# Patient Record
Sex: Male | Born: 1950
Health system: Southern US, Community
[De-identification: ages and names within clinical notes are randomized; demographics above are authoritative.]

## PROBLEM LIST (undated history)

## (undated) DIAGNOSIS — D649 Anemia, unspecified: Secondary | ICD-10-CM

## (undated) DIAGNOSIS — G4730 Sleep apnea, unspecified: Secondary | ICD-10-CM

## (undated) DIAGNOSIS — Z9889 Other specified postprocedural states: Secondary | ICD-10-CM

## (undated) DIAGNOSIS — I255 Ischemic cardiomyopathy: Secondary | ICD-10-CM

## (undated) DIAGNOSIS — I2510 Atherosclerotic heart disease of native coronary artery without angina pectoris: Secondary | ICD-10-CM

## (undated) DIAGNOSIS — Z8042 Family history of malignant neoplasm of prostate: Secondary | ICD-10-CM

## (undated) DIAGNOSIS — I301 Infective pericarditis: Secondary | ICD-10-CM

## (undated) DIAGNOSIS — T8859XA Other complications of anesthesia, initial encounter: Secondary | ICD-10-CM

## (undated) DIAGNOSIS — I213 ST elevation (STEMI) myocardial infarction of unspecified site: Secondary | ICD-10-CM

## (undated) DIAGNOSIS — Z8043 Family history of malignant neoplasm of testis: Secondary | ICD-10-CM

## (undated) DIAGNOSIS — I639 Cerebral infarction, unspecified: Secondary | ICD-10-CM

## (undated) DIAGNOSIS — F32A Depression, unspecified: Secondary | ICD-10-CM

## (undated) DIAGNOSIS — C679 Malignant neoplasm of bladder, unspecified: Secondary | ICD-10-CM

## (undated) DIAGNOSIS — Z803 Family history of malignant neoplasm of breast: Secondary | ICD-10-CM

## (undated) DIAGNOSIS — H269 Unspecified cataract: Secondary | ICD-10-CM

## (undated) DIAGNOSIS — E785 Hyperlipidemia, unspecified: Secondary | ICD-10-CM

## (undated) DIAGNOSIS — I10 Essential (primary) hypertension: Secondary | ICD-10-CM

## (undated) DIAGNOSIS — E119 Type 2 diabetes mellitus without complications: Secondary | ICD-10-CM

## (undated) DIAGNOSIS — I25119 Atherosclerotic heart disease of native coronary artery with unspecified angina pectoris: Secondary | ICD-10-CM

## (undated) DIAGNOSIS — K219 Gastro-esophageal reflux disease without esophagitis: Secondary | ICD-10-CM

## (undated) DIAGNOSIS — F329 Major depressive disorder, single episode, unspecified: Secondary | ICD-10-CM

## (undated) DIAGNOSIS — G709 Myoneural disorder, unspecified: Secondary | ICD-10-CM

## (undated) DIAGNOSIS — M1990 Unspecified osteoarthritis, unspecified site: Secondary | ICD-10-CM

## (undated) DIAGNOSIS — R251 Tremor, unspecified: Secondary | ICD-10-CM

## (undated) HISTORY — DX: Hyperlipidemia, unspecified: E78.5

## (undated) HISTORY — DX: Malignant neoplasm of bladder, unspecified: C67.9

## (undated) HISTORY — PX: WISDOM TOOTH EXTRACTION: SHX21

## (undated) HISTORY — DX: Essential (primary) hypertension: I10

## (undated) HISTORY — DX: Family history of malignant neoplasm of prostate: Z80.42

## (undated) HISTORY — DX: Atherosclerotic heart disease of native coronary artery with unspecified angina pectoris: I25.119

## (undated) HISTORY — DX: Sleep apnea, unspecified: G47.30

## (undated) HISTORY — DX: Family history of malignant neoplasm of testis: Z80.43

## (undated) HISTORY — DX: Type 2 diabetes mellitus without complications: E11.9

## (undated) HISTORY — DX: Atherosclerotic heart disease of native coronary artery without angina pectoris: I25.10

## (undated) HISTORY — PX: OTHER SURGICAL HISTORY: SHX169

## (undated) HISTORY — DX: Family history of malignant neoplasm of breast: Z80.3

## (undated) HISTORY — DX: Infective pericarditis: I30.1

## (undated) HISTORY — PX: COLONOSCOPY: SHX174

## (undated) HISTORY — PX: APPENDECTOMY: SHX54

## (undated) HISTORY — DX: Ischemic cardiomyopathy: I25.5

## (undated) HISTORY — PX: BLADDER SURGERY: SHX569

## (undated) HISTORY — DX: Unspecified cataract: H26.9

---

## 1987-05-25 DIAGNOSIS — Z9889 Other specified postprocedural states: Secondary | ICD-10-CM

## 1987-05-25 HISTORY — DX: Other specified postprocedural states: Z98.890

## 1987-05-25 HISTORY — PX: KNEE ARTHROSCOPY: SUR90

## 1998-05-24 HISTORY — PX: ROTATOR CUFF REPAIR: SHX139

## 2009-05-24 DIAGNOSIS — R251 Tremor, unspecified: Secondary | ICD-10-CM

## 2009-05-24 HISTORY — DX: Tremor, unspecified: R25.1

## 2012-05-24 DIAGNOSIS — C679 Malignant neoplasm of bladder, unspecified: Secondary | ICD-10-CM

## 2012-05-24 HISTORY — DX: Malignant neoplasm of bladder, unspecified: C67.9

## 2014-12-25 DIAGNOSIS — Z1211 Encounter for screening for malignant neoplasm of colon: Secondary | ICD-10-CM | POA: Diagnosis not present

## 2014-12-25 DIAGNOSIS — E119 Type 2 diabetes mellitus without complications: Secondary | ICD-10-CM | POA: Diagnosis not present

## 2014-12-25 DIAGNOSIS — J309 Allergic rhinitis, unspecified: Secondary | ICD-10-CM | POA: Diagnosis not present

## 2014-12-25 DIAGNOSIS — Z8371 Family history of colonic polyps: Secondary | ICD-10-CM | POA: Diagnosis not present

## 2014-12-25 DIAGNOSIS — J449 Chronic obstructive pulmonary disease, unspecified: Secondary | ICD-10-CM | POA: Diagnosis not present

## 2014-12-25 DIAGNOSIS — K219 Gastro-esophageal reflux disease without esophagitis: Secondary | ICD-10-CM | POA: Diagnosis not present

## 2014-12-25 DIAGNOSIS — N400 Enlarged prostate without lower urinary tract symptoms: Secondary | ICD-10-CM | POA: Diagnosis not present

## 2014-12-25 DIAGNOSIS — I10 Essential (primary) hypertension: Secondary | ICD-10-CM | POA: Diagnosis not present

## 2014-12-25 DIAGNOSIS — J45909 Unspecified asthma, uncomplicated: Secondary | ICD-10-CM | POA: Diagnosis not present

## 2014-12-25 DIAGNOSIS — K635 Polyp of colon: Secondary | ICD-10-CM | POA: Diagnosis not present

## 2014-12-25 DIAGNOSIS — Z79899 Other long term (current) drug therapy: Secondary | ICD-10-CM | POA: Diagnosis not present

## 2014-12-25 DIAGNOSIS — E785 Hyperlipidemia, unspecified: Secondary | ICD-10-CM | POA: Diagnosis not present

## 2014-12-27 DIAGNOSIS — Z8551 Personal history of malignant neoplasm of bladder: Secondary | ICD-10-CM | POA: Diagnosis not present

## 2015-04-10 DIAGNOSIS — J019 Acute sinusitis, unspecified: Secondary | ICD-10-CM | POA: Diagnosis not present

## 2015-04-10 DIAGNOSIS — J209 Acute bronchitis, unspecified: Secondary | ICD-10-CM | POA: Diagnosis not present

## 2015-04-25 DIAGNOSIS — Z8551 Personal history of malignant neoplasm of bladder: Secondary | ICD-10-CM | POA: Diagnosis not present

## 2015-06-13 ENCOUNTER — Encounter: Payer: Self-pay | Admitting: Pediatrics

## 2015-06-13 ENCOUNTER — Ambulatory Visit (INDEPENDENT_AMBULATORY_CARE_PROVIDER_SITE_OTHER): Payer: Medicare Other | Admitting: Pediatrics

## 2015-06-13 VITALS — BP 111/71 | HR 83 | Temp 96.8°F | Ht 76.0 in | Wt 225.4 lb

## 2015-06-13 DIAGNOSIS — Z8551 Personal history of malignant neoplasm of bladder: Secondary | ICD-10-CM | POA: Diagnosis not present

## 2015-06-13 DIAGNOSIS — F17200 Nicotine dependence, unspecified, uncomplicated: Secondary | ICD-10-CM | POA: Diagnosis not present

## 2015-06-13 DIAGNOSIS — K219 Gastro-esophageal reflux disease without esophagitis: Secondary | ICD-10-CM | POA: Diagnosis not present

## 2015-06-13 DIAGNOSIS — G250 Essential tremor: Secondary | ICD-10-CM

## 2015-06-13 DIAGNOSIS — E785 Hyperlipidemia, unspecified: Secondary | ICD-10-CM | POA: Diagnosis not present

## 2015-06-13 DIAGNOSIS — R140 Abdominal distension (gaseous): Secondary | ICD-10-CM | POA: Diagnosis not present

## 2015-06-13 DIAGNOSIS — E11319 Type 2 diabetes mellitus with unspecified diabetic retinopathy without macular edema: Secondary | ICD-10-CM

## 2015-06-13 LAB — POCT GLYCOSYLATED HEMOGLOBIN (HGB A1C): HEMOGLOBIN A1C: 7.8

## 2015-06-13 MED ORDER — METFORMIN HCL 500 MG PO TABS: 1000.0000 mg | ORAL_TABLET | Freq: Two times a day (BID) | ORAL | Status: DC

## 2015-06-13 MED ORDER — OMEPRAZOLE 20 MG PO CPDR: 20.0000 mg | DELAYED_RELEASE_CAPSULE | Freq: Every day | ORAL | Status: DC

## 2015-06-13 MED ORDER — GLIMEPIRIDE 4 MG PO TABS: 8.0000 mg | ORAL_TABLET | Freq: Every day | ORAL | Status: DC

## 2015-06-13 MED ORDER — PROPRANOLOL HCL ER 60 MG PO CP24: 60.0000 mg | ORAL_CAPSULE | Freq: Two times a day (BID) | ORAL | Status: DC

## 2015-06-13 MED ORDER — SIMVASTATIN 40 MG PO TABS: 40.0000 mg | ORAL_TABLET | Freq: Every day | ORAL | Status: DC

## 2015-06-13 MED ORDER — FENOFIBRATE MICRONIZED 134 MG PO CAPS: 134.0000 mg | ORAL_CAPSULE | Freq: Every day | ORAL | Status: DC

## 2015-06-13 MED ORDER — CANAGLIFLOZIN 300 MG PO TABS: 300.0000 mg | ORAL_TABLET | Freq: Every day | ORAL | Status: DC

## 2015-06-13 NOTE — Progress Notes (Signed)
Subjective:    Patient ID: Andrew [name], male    DOB: Nov 23, 1950, 65 y.o.   MRN: [id number]  CC: multiple med problem f/u  HPI: Andrew [name] is a 65 y.o. male presenting for New Patient (Initial Visit)  Last surgery for bladder cancer 1.5 yrs ago, had a tuberculin treatment per pt. No chemo or Radiation. Has been getting cystoscopes every 3 months. Last scope 03/25/2015.  DM2: Not on insulin, has been well controlled in past per pt. Doesn't take BGLs regularly at home.  Has had mulitple injuries, borken R leg, R knee injuries, L knee injuries  Was working in [employer], has also farmer  Colonoscopy: 7-8 yrs ago in [place]. He is going to sign to get [place] records.  No chest pain, no SOB. Otherwise feeling well.  No headaches, no numbness or tingling in feet, no vision changes.   Depression screen PHQ 2/9 06/13/2015  Decreased Interest 0  Down, Depressed, Hopeless 0  PHQ - 2 Score 0     ROS: All systems negative other than what is in HPI   Past Medical History  Diagnosis Date  . Hyperlipidemia   . Diabetes mellitus without complication ([place])   . Cancer [facility])     Bladder     History reviewed. No pertinent family history.  Mom--diabetes No heart attacks or strokes   Social History   Social History  . Marital Status: Single    Spouse Name: N/A  . Number of Children: N/A  . Years of Education: N/A   Occupational History  . Not on file.   Social History Main Topics  . Smoking status: Current Every Day Smoker -- 1.00 packs/day for 42 years    Types: Cigarettes  . Smokeless tobacco: Not on file  . Alcohol Use: No  . Drug Use: No  . Sexual Activity: Not on file   Other Topics Concern  . Not on file   Social History Narrative  . No narrative on file     Current Outpatient Prescriptions  Medication Sig Dispense Refill  . aspirin 81 MG tablet Take 81 mg by mouth daily.    . canagliflozin (INVOKANA) 300 MG TABS tablet Take 300 mg by mouth daily before  breakfast. 90 tablet 2  . fenofibrate micronized (LOFIBRA) 134 MG capsule Take 1 capsule (134 mg total) by mouth daily before breakfast. 90 capsule 2  . glimepiride (AMARYL) 4 MG tablet Take 2 tablets (8 mg total) by mouth daily with breakfast. 180 tablet 2  . metFORMIN (GLUCOPHAGE) 500 MG tablet Take 2 tablets (1,000 mg total) by mouth 2 (two) times daily with a meal. 180 tablet 2  . omeprazole (PRILOSEC) 20 MG capsule Take 1 capsule (20 mg total) by mouth daily. 90 capsule 2  . propranolol ER (INDERAL LA) 60 MG 24 hr capsule Take 1 capsule (60 mg total) by mouth 2 (two) times daily. 90 capsule 2  . simvastatin (ZOCOR) 40 MG tablet Take 1 tablet (40 mg total) by mouth daily. 90 tablet 2   No current facility-administered medications for this visit.       Objective:    BP 111/71 mmHg  Pulse 83  Temp(Src) 96.8 F (36 C) (Oral)  Ht _0  (1.93 m)  Wt 225 lb 6.4 oz (102.241 kg)  BMI 27.45 kg/m2  Wt Readings from Last 3 Encounters:  06/13/15 225 lb 6.4 oz (102.241 kg)     Gen: NAD, alert, cooperative with exam, NCAT EYES: EOMI, no scleral  injection or icterus ENT:  TMs pearly gray b/l, OP without erythema LYMPH: no cervical LAD CV: NRRR, normal S1/S2, no murmur, distal pulses 2+ b/l Resp: CTABL, no wheezes, normal WOB Abd: +BS, soft, NTND. no guarding or organomegaly Ext: No edema, warm Neuro: Alert and oriented, strength equal b/l UE and LE, coordination grossly normal MSK: normal muscle bulk     Assessment & Plan:    Andrew [name] was seen today for multiple med problem follow up and to establish care  Diagnoses and all orders for this visit:  Type 2 diabetes mellitus with retinopathy, without long-term current use of insulin, macular edema presence unspecified, unspecified laterality, unspecified retinopathy severity ([place]) Foot exam completed today. -     POCT glycosylated hemoglobin (Hb A1C) -     CMP14+EGFR -     CBC -     Microalbumin/Creatinine Ratio, Urine -      Ambulatory referral to Ophthalmology -     canagliflozin (INVOKANA) 300 MG TABS tablet; Take 300 mg by mouth daily before breakfast. -     glimepiride (AMARYL) 4 MG tablet; Take 2 tablets (8 mg total) by mouth daily with breakfast. -     metFORMIN (GLUCOPHAGE) 500 MG tablet; Take 2 tablets (1,000 mg total) by mouth 2 (two) times daily with a meal.  Hyperlipidemia -     fenofibrate micronized (LOFIBRA) 134 MG capsule; Take 1 capsule (134 mg total) by mouth daily before breakfast. -     simvastatin (ZOCOR) 40 MG tablet; Take 1 tablet (40 mg total) by mouth daily. -     Lipid panel  Tobacco use disorder Precontemplative, will continue to encourage cessation. Has tried chantix twice in the past.   Gastroesophageal reflux disease, esophagitis presence not specified Symptoms well controlled with below, continue. -     omeprazole (PRILOSEC) 20 MG capsule; Take 1 capsule (20 mg total) by mouth daily.  Bloating  History of bladder cancer -     Ambulatory referral to Urology  Essential tremor Symptoms well ocntrolled with below -     propranolol ER (INDERAL LA) 60 MG 24 hr capsule; Take 1 capsule (60 mg total) by mouth 2 (two) times daily.    Follow up plan: Return in about 6 months (around 12/11/2015).  Assunta Found, MD [facility] 06/13/2015, 11:09 AM

## 2015-06-14 LAB — LIPID PANEL
CHOLESTEROL TOTAL: 163 mg/dL (ref 100–199)
Chol/HDL Ratio: 3.9 ratio units (ref 0.0–5.0)
HDL: 42 mg/dL (ref >39)
LDL CALC: 59 mg/dL (ref 0–99)
TRIGLYCERIDES: 308 mg/dL — AB (ref 0–149)
VLDL Cholesterol Cal: 62 mg/dL — ABNORMAL HIGH (ref 5–40)

## 2015-06-14 LAB — CMP14+EGFR
A/G RATIO: 1.8 (ref 1.1–2.5)
ALT: 29 IU/L (ref 0–44)
AST: 22 IU/L (ref 0–40)
Albumin: 4.5 g/dL (ref 3.6–4.8)
Alkaline Phosphatase: 61 IU/L (ref 39–117)
BUN/Creatinine Ratio: 22 (ref 10–22)
BUN: 17 mg/dL (ref 8–27)
Bilirubin Total: 0.5 mg/dL (ref 0.0–1.2)
CALCIUM: 9.9 mg/dL (ref 8.6–10.2)
CO2: 19 mmol/L (ref 18–29)
CREATININE: 0.78 mg/dL (ref 0.76–1.27)
Chloride: 99 mmol/L (ref 96–106)
GFR, EST AFRICAN AMERICAN: 110 mL/min/{1.73_m2} (ref >59)
GFR, EST NON AFRICAN AMERICAN: 95 mL/min/{1.73_m2} (ref >59)
Globulin, Total: 2.5 g/dL (ref 1.5–4.5)
Glucose: 144 mg/dL — ABNORMAL HIGH (ref 65–99)
POTASSIUM: 4.4 mmol/L (ref 3.5–5.2)
Sodium: 141 mmol/L (ref 134–144)
TOTAL PROTEIN: 7 g/dL (ref 6.0–8.5)

## 2015-06-14 LAB — CBC
HEMATOCRIT: 50 % (ref 37.5–51.0)
HEMOGLOBIN: 17.5 g/dL (ref 12.6–17.7)
MCH: 32 pg (ref 26.6–33.0)
MCHC: 35 g/dL (ref 31.5–35.7)
MCV: 91 fL (ref 79–97)
Platelets: 172 10*3/uL (ref 150–379)
RBC: 5.47 x10E6/uL (ref 4.14–5.80)
RDW: 13.1 % (ref 12.3–15.4)
WBC: 7.4 10*3/uL (ref 3.4–10.8)

## 2015-06-14 LAB — MICROALBUMIN / CREATININE URINE RATIO
CREATININE, UR: 70 mg/dL
MICROALB/CREAT RATIO: 17 mg/g creat (ref 0.0–30.0)
MICROALBUM., U, RANDOM: 11.9 ug/mL

## 2015-06-16 ENCOUNTER — Telehealth: Payer: Self-pay | Admitting: Family Medicine

## 2015-06-16 NOTE — Telephone Encounter (Signed)
Patient states Suzie Portela has question about the quantity of his prescription for Metformin that was sent in on 06/13/15.  The prescription is written as Metformin 500 mg, 2 tablets twice daily.  It was only sent in as quantity of 180 for 3 months, should have been for quantity of 360.  I contacted [facility] [place] and spoke with Gwenette Greet and informed her the quantity should be 360 with 2 refills.  Contacted the patient and informed him that this had been taken care of.

## 2015-07-02 DIAGNOSIS — E119 Type 2 diabetes mellitus without complications: Secondary | ICD-10-CM | POA: Diagnosis not present

## 2015-07-02 DIAGNOSIS — Z7984 Long term (current) use of oral hypoglycemic drugs: Secondary | ICD-10-CM | POA: Diagnosis not present

## 2015-08-08 ENCOUNTER — Ambulatory Visit (INDEPENDENT_AMBULATORY_CARE_PROVIDER_SITE_OTHER): Payer: Medicare Other | Admitting: Urology

## 2015-08-08 DIAGNOSIS — C675 Malignant neoplasm of bladder neck: Secondary | ICD-10-CM

## 2015-08-08 DIAGNOSIS — Z8551 Personal history of malignant neoplasm of bladder: Secondary | ICD-10-CM

## 2015-08-08 DIAGNOSIS — C679 Malignant neoplasm of bladder, unspecified: Secondary | ICD-10-CM | POA: Diagnosis not present

## 2015-08-15 ENCOUNTER — Other Ambulatory Visit: Payer: Self-pay

## 2015-08-15 DIAGNOSIS — D494 Neoplasm of unspecified behavior of bladder: Secondary | ICD-10-CM

## 2015-08-15 NOTE — Patient Instructions (Signed)
Andrew [name]  08/15/2015     @PREFPERIOPPHARMACY @   Your procedure is scheduled on  08/22/2015   Report to [facility] at  94  A.M.  Call this number if you have problems the morning of surgery:  [phone]   Remember:  Do not eat food or drink liquids after midnight.  Take these medicines the morning of surgery with A SIP OF WATER  Prilosec, inderal.   Do not wear jewelry, make-up or nail polish.  Do not wear lotions, powders, or perfumes.  You may wear deodorant.  Do not shave 48 hours prior to surgery.  Men may shave face and neck.  Do not bring valuables to the hospital.  [facility] is not responsible for any belongings or valuables.  Contacts, dentures or bridgework may not be worn into surgery.  Leave your suitcase in the car.  After surgery it may be brought to your room.  For patients admitted to the hospital, discharge time will be determined by your treatment team.  Patients discharged the day of surgery will not be allowed to drive home.   Name and phone number of your driver:   family Special instructions:  none  Please read over the following fact sheets that you were given. Coughing and Deep Breathing, Surgical Site Infection Prevention, Anesthesia Post-op Instructions and Care and Recovery After Surgery      Cystoscopy Cystoscopy is a procedure that is used to help your caregiver diagnose and sometimes treat conditions that affect your lower urinary tract. Your lower urinary tract includes your bladder and the tube through which urine passes from your bladder out of your body (urethra). Cystoscopy is performed with a thin, tube-shaped instrument (cystoscope). The cystoscope has lenses and a light at the end so that your caregiver can see inside your bladder. The cystoscope is inserted at the entrance of your urethra. Your caregiver guides it through your urethra and into your bladder. There are two main types of cystoscopy:  Flexible  cystoscopy (with a flexible cystoscope).  Rigid cystoscopy (with a rigid cystoscope). Cystoscopy may be recommended for many conditions, including:  Urinary tract infections.  Blood in your urine (hematuria).  Loss of bladder control (urinary incontinence) or overactive bladder.  Unusual cells found in a urine sample.  Urinary blockage.  Painful urination. Cystoscopy may also be done to remove a sample of your tissue to be checked under a microscope (biopsy). It may also be done to remove or destroy bladder stones. LET YOUR CAREGIVER KNOW ABOUT:  Allergies to food or medicine.  Medicines taken, including vitamins, herbs, eyedrops, over-the-counter medicines, and creams.  Use of steroids (by mouth or creams).  Previous problems with anesthetics or numbing medicines.  History of bleeding problems or blood clots.  Previous surgery.  Other health problems, including diabetes and kidney problems.  Possibility of pregnancy, if this applies. PROCEDURE The area around the opening to your urethra will be cleaned. A medicine to numb your urethra (local anesthetic) is used. If a tissue sample or stone is removed during the procedure, you may be given a medicine to make you sleep (general anesthetic). Your caregiver will gently insert the tip of the cystoscope into your urethra. The cystoscope will be slowly glided through your urethra and into your bladder. Sterile fluid will flow through the cystoscope and into your bladder. The fluid will expand and stretch your bladder. This gives your caregiver a better view of your bladder  walls. The procedure lasts about 15-20 minutes. AFTER THE PROCEDURE If a local anesthetic is used, you will be allowed to go home as soon as you are ready. If a general anesthetic is used, you will be taken to a recovery area until you are stable. You may have temporary bleeding and burning on urination.   This information is not intended to replace advice given  to you by your health care provider. Make sure you discuss any questions you have with your health care provider.   Document Released: 05/07/2000 Document Revised: 05/31/2014 Document Reviewed: 11/01/2011 Elsevier Interactive Patient Education 2016 [place]. Cystoscopy, Care After Refer to this sheet in the next few weeks. These instructions provide you with information on caring for yourself after your procedure. Your caregiver may also give you more specific instructions. Your treatment has been planned according to current medical practices, but problems sometimes occur. Call your caregiver if you have any problems or questions after your procedure. HOME CARE INSTRUCTIONS  Things you can do to ease any discomfort after your procedure include:  Drinking enough water and fluids to keep your urine clear or pale yellow.  Taking a warm bath to relieve any burning feelings. SEEK IMMEDIATE MEDICAL CARE IF:   You have an increase in blood in your urine.  You notice blood clots in your urine.  You have difficulty passing urine.  You have the chills.  You have abdominal pain.  You have a fever or persistent symptoms for more than 2-3 days.  You have a fever and your symptoms suddenly get worse. MAKE SURE YOU:   Understand these instructions.  Will watch your condition.  Will get help right away if you are not doing well or get worse.   This information is not intended to replace advice given to you by your health care provider. Make sure you discuss any questions you have with your health care provider.   Document Released: 11/27/2004 Document Revised: 05/31/2014 Document Reviewed: 11/01/2011 Elsevier Interactive Patient Education 2016 [employer] PATIENT INSTRUCTIONS POST-ANESTHESIA  IMMEDIATELY FOLLOWING SURGERY:  Do not drive or operate machinery for the first twenty four hours after surgery.  Do not make any important decisions for twenty four hours after surgery or  while taking narcotic pain medications or sedatives.  If you develop intractable nausea and vomiting or a severe headache please notify your doctor immediately.  FOLLOW-UP:  Please make an appointment with your surgeon as instructed. You do not need to follow up with anesthesia unless specifically instructed to do so.  WOUND CARE INSTRUCTIONS (if applicable):  Keep a dry clean dressing on the anesthesia/puncture wound site if there is drainage.  Once the wound has quit draining you may leave it open to air.  Generally you should leave the bandage intact for twenty four hours unless there is drainage.  If the epidural site drains for more than 36-48 hours please call the anesthesia department.  QUESTIONS?:  Please feel free to call your physician or the hospital operator if you have any questions, and they will be happy to assist you.

## 2015-08-18 ENCOUNTER — Encounter (HOSPITAL_COMMUNITY)
Admission: RE | Admit: 2015-08-18 | Discharge: 2015-08-18 | Disposition: A | Discharge status: Home / Self Care | Payer: Medicare Other | Source: Ambulatory Visit | Attending: Urology | Admitting: Urology

## 2015-08-18 ENCOUNTER — Other Ambulatory Visit: Payer: Self-pay

## 2015-08-18 ENCOUNTER — Encounter (HOSPITAL_COMMUNITY): Payer: Self-pay

## 2015-08-18 DIAGNOSIS — Z01812 Encounter for preprocedural laboratory examination: Secondary | ICD-10-CM | POA: Diagnosis not present

## 2015-08-18 DIAGNOSIS — Z01810 Encounter for preprocedural cardiovascular examination: Secondary | ICD-10-CM | POA: Diagnosis present

## 2015-08-18 HISTORY — DX: Tremor, unspecified: R25.1

## 2015-08-18 HISTORY — DX: Cerebral infarction, unspecified: I63.9

## 2015-08-18 HISTORY — DX: Other specified postprocedural states: Z98.890

## 2015-08-19 LAB — BASIC METABOLIC PANEL
Anion gap: 12 (ref 5–15)
BUN: 21 mg/dL — ABNORMAL HIGH (ref 6–20)
CALCIUM: 9.6 mg/dL (ref 8.9–10.3)
CO2: 22 mmol/L (ref 22–32)
CREATININE: 0.76 mg/dL (ref 0.61–1.24)
Chloride: 105 mmol/L (ref 101–111)
Glucose, Bld: 161 mg/dL — ABNORMAL HIGH (ref 65–99)
Potassium: 4.4 mmol/L (ref 3.5–5.1)
SODIUM: 139 mmol/L (ref 135–145)

## 2015-08-19 LAB — CBC
HCT: 53.5 % — ABNORMAL HIGH (ref 39.0–52.0)
Hemoglobin: 18.4 g/dL — ABNORMAL HIGH (ref 13.0–17.0)
MCH: 33.2 pg (ref 26.0–34.0)
MCHC: 34.4 g/dL (ref 30.0–36.0)
MCV: 96.4 fL (ref 78.0–100.0)
PLATELETS: 173 10*3/uL (ref 150–400)
RBC: 5.55 MIL/uL (ref 4.22–5.81)
RDW: 12.9 % (ref 11.5–15.5)
WBC: 9 10*3/uL (ref 4.0–10.5)

## 2015-08-21 NOTE — H&P (Signed)
Chief Complaint  I have a history of bladder cancer.   Active Problems  1. History of malignant neoplasm of bladder (Z85.51)  2. Malignant neoplasm of bladder neck (C67.5)  History of Present Illness     Andrew [name] is a 65 yo WM who was sent in consultation by Dr. Evette Doffing at [facility] for his history of bladder cancer.   He was diagnosed with a large LGNMIBC on the left bladder wall in 6/14.  He had a stent initially.  He was given BCG after the initial resection.  He had recurrence resected in 6/15 but didn't get the BCG because of the shortage.   He had a recurrence in 9/15 that required another resection and got another induction course but has not had maintenance.  His last cysto was negative in 12/16.  He has had urgency since his initial treatment with occasional UUI.  He has some frequency and intermittency with PVD.   He has nocturia 3-4x.  He has no hesitancy and has a good stream..  He has had no recent hematuria.   Past Medical History  1. History of bladder cancer (Z85.51)  2. History of diabetes mellitus (Z86.39)  3. History of hypercholesterolemia (Z86.39)  4. History of hypertension (Z86.79)  5. History of malignant neoplasm of bladder (Z85.51)  6. History of sleep apnea (Z86.69)  7. History of stroke [id number])  Surgical History  1. History of Appendectomy  2. History of Arthroscopy Knee Right  3. History of Bladder Surgery  4. History of Leg Repair  5. History of Rotator Cuff Repair  Current Meds  1. Glimepiride TABS;  Therapy: (Recorded:17Mar2017) to Recorded  2. Invokana TABS;  Therapy: (Recorded:17Mar2017) to Recorded  3. MetFORMIN HCl TABS;  Therapy: (Recorded:17Mar2017) to Recorded  4. Omeprazole TBEC;  Therapy: (Recorded:17Mar2017) to Recorded  5. Propranolol HCl ER CP24;  Therapy: (Recorded:17Mar2017) to Recorded  6. Simvastatin TABS;  Therapy: (Recorded:17Mar2017) to Recorded  Allergies  1. No Known Drug Allergies  Family History  1. Family history of  diabetes mellitus (Z83.3)  Social History   Alcohol use (Z78.9)   Caffeine use (F15.90)   Divorced   Retired   Tobacco use (Z72.0)   1 pack for 48 years  Review of Systems Genitourinary, constitutional, skin, eye, otolaryngeal, hematologic/lymphatic, cardiovascular, pulmonary, endocrine, musculoskeletal, gastrointestinal, neurological and psychiatric system(s) were reviewed and pertinent findings if present are noted and are otherwise negative.  Genitourinary: urinary frequency, feelings of urinary urgency, nocturia, incontinence, urinary stream starts and stops and erectile dysfunction.  Gastrointestinal: heartburn and constipation.  Constitutional: feeling tired (fatigue).  ENT: sinus problems.  Endocrine: polydipsia.  Musculoskeletal: back pain and joint pain.    Vitals Vital Signs [Data Includes: Last 1 Day]  Recorded: [id number] 03:14PM  Height: 6 ft 4 in Weight: 220 lb  BMI Calculated: 26.78 BSA Calculated: 2.31 Blood Pressure: 114 / 71 Temperature: 98.4 F Heart Rate: 79  Physical Exam Constitutional: Well nourished and well developed . No acute distress.  ENT:. The ears and nose are normal in appearance.  Neck: The appearance of the neck is normal and no neck mass is present.  Pulmonary: No respiratory distress and normal respiratory rhythm and effort.  Cardiovascular: Heart rate and rhythm are normal . No peripheral edema.  Abdomen: The abdomen is mildly obese. The abdomen is soft and nontender. No masses are palpated. No CVA tenderness. No hernias are palpable. No hepatosplenomegaly noted.  Genitourinary: Examination of the penis demonstrates no discharge, no masses, no lesions and  a normal meatus. The scrotum is without lesions. The right epididymis is palpably normal and non-tender. The left epididymis is palpably normal and non-tender. The right testis is non-tender and without masses. The left testis is non-tender and without masses.  Lymphatics: The femoral  and inguinal nodes are not enlarged or tender.  Skin: Normal skin turgor, no visible rash and no visible skin lesions.  Neuro/Psych:. Mood and affect are appropriate.    Results/Data Urine [Data Includes: Last 1 Day]   [id number]  COLOR YELLOW   APPEARANCE CLEAR   SPECIFIC GRAVITY 1.015   pH 5.0   GLUCOSE 3+   BILIRUBIN NEGATIVE   KETONE NEGATIVE   BLOOD NEGATIVE   PROTEIN NEGATIVE   NITRITE NEGATIVE   LEUKOCYTE ESTERASE NEGATIVE    Old records or history reviewed: I have reviewed records from his Urologist in [place] and from [facility].  The following clinical lab reports were reviewed:  UA reviewed.    Procedure  Procedure: Cystoscopy   Informed Consent: Risks, benefits, and potential adverse events were discussed and informed consent was obtained from the patient.  Prep: The patient was prepped with betadine.  Antibiotic prophylaxis: Ciprofloxacin.  Procedure Note:  Urethral meatus:. No abnormalities.  Anterior urethra: No abnormalities.  Prostatic urethra: No abnormalities . Estimated length was 3 cm. The lateral prostatic lobes were enlarged. No intravesical median lobe was visualized.  Bladder: Visulization was clear. The ureteral orifices were in the normal anatomic position bilaterally and had clear efflux of urine. The mucosa was smooth without abnormalities. Examination of the bladder demonstrated erythematous mucosa located on the right side, near the dome of the bladder measuring approximately 1 cm question inflammationi vs CIS. A solitary tumor was visualized in the bladder. A papillary tumor was seen in the bladder measuring approximately 0.5 cm in size. This tumor was located on the right side, at the neck of the bladder. The patient tolerated the procedure well.  Complications: None.    Assessment  1. History of malignant neoplasm of bladder (Z85.51)  2. Malignant neoplasm of bladder neck (C67.5)   He has a small recurrent tumor at the right bladder neck and an  erythematous lesion on the right dome.   Plan Malignant neoplasm of bladder neck   1. Follow-up Schedule Surgery Office  Follow-up  Status: Hold For - Appointment   Requested for: [id number]  2. URINE CYTOLOGY; Status:Hold For - Specimen/Data Collection,Appointment;  Requested for:17Mar2017;  PMH: History of malignant neoplasm of bladder   3. Cysto; Status:Hold For - Appointment,Date of Service; Requested for:17Mar2017;   4. UA With REFLEX; [Do Not Release]; Status:Hold For - [employer];  Requested for:17Mar2017;    I am going to get him set up for cystoscopy with bladder biopsy and fulguration and bilateral retrograde pyelograms.   I have reviewed the risks of bleeding, infection, ureteral and bladder injury, thrombotic events and anesthetic risks.  I don't believe he will need [facility] but may need subsequent BCG.   Discussion/Summary  CC: Dr. Assunta Found.

## 2015-08-22 ENCOUNTER — Encounter (HOSPITAL_COMMUNITY)
Admission: RE | Disposition: A | Discharge status: Home / Self Care | Payer: Self-pay | Source: Ambulatory Visit | Attending: Urology

## 2015-08-22 ENCOUNTER — Ambulatory Visit (HOSPITAL_COMMUNITY): Payer: Medicare Other | Admitting: Anesthesiology

## 2015-08-22 ENCOUNTER — Ambulatory Visit (HOSPITAL_COMMUNITY)
Admission: RE | Admit: 2015-08-22 | Discharge: 2015-08-22 | Disposition: A | Discharge status: Home / Self Care | Payer: Medicare Other | Source: Ambulatory Visit | Attending: Urology | Admitting: Urology

## 2015-08-22 ENCOUNTER — Ambulatory Visit (HOSPITAL_COMMUNITY): Payer: Medicare Other

## 2015-08-22 ENCOUNTER — Encounter (HOSPITAL_COMMUNITY): Payer: Self-pay | Admitting: *Deleted

## 2015-08-22 DIAGNOSIS — F17200 Nicotine dependence, unspecified, uncomplicated: Secondary | ICD-10-CM | POA: Diagnosis not present

## 2015-08-22 DIAGNOSIS — Z8673 Personal history of transient ischemic attack (TIA), and cerebral infarction without residual deficits: Secondary | ICD-10-CM | POA: Diagnosis not present

## 2015-08-22 DIAGNOSIS — E119 Type 2 diabetes mellitus without complications: Secondary | ICD-10-CM | POA: Diagnosis not present

## 2015-08-22 DIAGNOSIS — N3020 Other chronic cystitis without hematuria: Secondary | ICD-10-CM | POA: Diagnosis not present

## 2015-08-22 DIAGNOSIS — K219 Gastro-esophageal reflux disease without esophagitis: Secondary | ICD-10-CM | POA: Insufficient documentation

## 2015-08-22 DIAGNOSIS — Z7984 Long term (current) use of oral hypoglycemic drugs: Secondary | ICD-10-CM | POA: Insufficient documentation

## 2015-08-22 DIAGNOSIS — Z466 Encounter for fitting and adjustment of urinary device: Secondary | ICD-10-CM | POA: Diagnosis not present

## 2015-08-22 DIAGNOSIS — Z7982 Long term (current) use of aspirin: Secondary | ICD-10-CM | POA: Insufficient documentation

## 2015-08-22 DIAGNOSIS — D494 Neoplasm of unspecified behavior of bladder: Secondary | ICD-10-CM | POA: Insufficient documentation

## 2015-08-22 HISTORY — PX: CYSTOSCOPY W/ RETROGRADES: SHX1426

## 2015-08-22 HISTORY — PX: CYSTOSCOPY WITH BIOPSY: SHX5122

## 2015-08-22 LAB — GLUCOSE, CAPILLARY
GLUCOSE-CAPILLARY: 147 mg/dL — AB (ref 65–99)
Glucose-Capillary: 155 mg/dL — ABNORMAL HIGH (ref 65–99)

## 2015-08-22 SURGERY — CYSTOSCOPY, WITH RETROGRADE PYELOGRAM
Anesthesia: General

## 2015-08-22 MED ORDER — ACETAMINOPHEN 650 MG RE SUPP
650.0000 mg | RECTAL | Status: DC | PRN
  Filled 2015-08-22: qty 1

## 2015-08-22 MED ORDER — SODIUM CHLORIDE 0.9 % IR SOLN
Status: DC | PRN
  Administered 2015-08-22: 3000 mL

## 2015-08-22 MED ORDER — ACETAMINOPHEN 325 MG PO TABS: 650.0000 mg | ORAL_TABLET | ORAL | Status: DC | PRN

## 2015-08-22 MED ORDER — STERILE WATER FOR IRRIGATION IR SOLN
Status: DC | PRN
  Administered 2015-08-22: 3000 mL

## 2015-08-22 MED ORDER — SODIUM CHLORIDE 0.9% FLUSH: 3.0000 mL | Freq: Two times a day (BID) | INTRAVENOUS | Status: DC

## 2015-08-22 MED ORDER — SODIUM CHLORIDE 0.9 % IV SOLN: 250.0000 mL | INTRAVENOUS | Status: DC | PRN

## 2015-08-22 MED ORDER — LACTATED RINGERS IV SOLN
INTRAVENOUS | Status: DC
  Administered 2015-08-22: 11:00:00 via INTRAVENOUS

## 2015-08-22 MED ORDER — TRAMADOL-ACETAMINOPHEN 37.5-325 MG PO TABS: 1.0000 | ORAL_TABLET | Freq: Four times a day (QID) | ORAL | Status: DC | PRN

## 2015-08-22 MED ORDER — PROPOFOL 10 MG/ML IV BOLUS
INTRAVENOUS | Status: DC | PRN
  Administered 2015-08-22: 160 mg via INTRAVENOUS

## 2015-08-22 MED ORDER — FENTANYL CITRATE (PF) 100 MCG/2ML IJ SOLN
INTRAMUSCULAR | Status: DC | PRN
  Administered 2015-08-22 (×2): 25 ug via INTRAVENOUS

## 2015-08-22 MED ORDER — MIDAZOLAM HCL 2 MG/2ML IJ SOLN
INTRAMUSCULAR | Status: AC
  Filled 2015-08-22: qty 2

## 2015-08-22 MED ORDER — FENTANYL CITRATE (PF) 100 MCG/2ML IJ SOLN
INTRAMUSCULAR | Status: AC
  Filled 2015-08-22: qty 2

## 2015-08-22 MED ORDER — OXYCODONE HCL 5 MG PO TABS: 5.0000 mg | ORAL_TABLET | ORAL | Status: DC | PRN

## 2015-08-22 MED ORDER — MIDAZOLAM HCL 2 MG/2ML IJ SOLN
1.0000 mg | INTRAMUSCULAR | Status: DC | PRN
  Administered 2015-08-22: 2 mg via INTRAVENOUS

## 2015-08-22 MED ORDER — SODIUM CHLORIDE 0.9% FLUSH: 3.0000 mL | INTRAVENOUS | Status: DC | PRN

## 2015-08-22 MED ORDER — MIDAZOLAM HCL 5 MG/5ML IJ SOLN
INTRAMUSCULAR | Status: DC | PRN
  Administered 2015-08-22 (×2): 1 mg via INTRAVENOUS

## 2015-08-22 MED ORDER — LIDOCAINE HCL (PF) 1 % IJ SOLN
INTRAMUSCULAR | Status: AC
  Filled 2015-08-22: qty 5

## 2015-08-22 MED ORDER — CEFAZOLIN SODIUM-DEXTROSE 2-4 GM/100ML-% IV SOLN
INTRAVENOUS | Status: AC
  Filled 2015-08-22: qty 100

## 2015-08-22 MED ORDER — FENTANYL CITRATE (PF) 100 MCG/2ML IJ SOLN: 25.0000 ug | INTRAMUSCULAR | Status: DC | PRN

## 2015-08-22 MED ORDER — PROPOFOL 10 MG/ML IV BOLUS
INTRAVENOUS | Status: AC
  Filled 2015-08-22: qty 20

## 2015-08-22 MED ORDER — STERILE WATER FOR IRRIGATION IR SOLN
Status: DC | PRN
  Administered 2015-08-22: 1000 mL

## 2015-08-22 MED ORDER — CEFAZOLIN SODIUM-DEXTROSE 2-4 GM/100ML-% IV SOLN
2.0000 g | INTRAVENOUS | Status: AC
  Administered 2015-08-22: 2 g via INTRAVENOUS

## 2015-08-22 MED ORDER — ONDANSETRON HCL 4 MG/2ML IJ SOLN: 4.0000 mg | Freq: Once | INTRAMUSCULAR | Status: DC | PRN

## 2015-08-22 MED ORDER — LIDOCAINE HCL 1 % IJ SOLN
INTRAMUSCULAR | Status: DC | PRN
  Administered 2015-08-22: 30 mg via INTRADERMAL

## 2015-08-22 MED ORDER — IOHEXOL 350 MG/ML SOLN
INTRAVENOUS | Status: DC | PRN
  Administered 2015-08-22: 25 mL via INTRAVENOUS

## 2015-08-22 MED ORDER — FENTANYL CITRATE (PF) 100 MCG/2ML IJ SOLN
25.0000 ug | INTRAMUSCULAR | Status: AC
  Administered 2015-08-22: 25 ug via INTRAVENOUS

## 2015-08-22 SURGICAL SUPPLY — 27 items
BAG DRAIN URO TABLE W/ADPT NS (DRAPE) ×4 IMPLANT
BAG HAMPER (MISCELLANEOUS) ×4 IMPLANT
BASKET LASER NITINOL 1.9FR (BASKET) IMPLANT
BASKET STNLS GEMINI 4WIRE 3FR (BASKET) IMPLANT
BASKET ZERO TIP NITINOL 2.4FR (BASKET) IMPLANT
CATH URET 5FR 28IN CONE TIP (BALLOONS)
CATH URET 5FR 28IN OPEN ENDED (CATHETERS) IMPLANT
CATH URET 5FR 70CM CONE TIP (BALLOONS) IMPLANT
CLOTH BEACON ORANGE TIMEOUT ST (SAFETY) ×4 IMPLANT
ELECT REM PT RETURN 9FT ADLT (ELECTROSURGICAL)
ELECTRODE REM PT RTRN 9FT ADLT (ELECTROSURGICAL) IMPLANT
GLOVE SURG SS PI 8.0 STRL IVOR (GLOVE) ×4 IMPLANT
GOWN STRL REIN XL XLG (GOWN DISPOSABLE) ×4 IMPLANT
GOWN STRL REUS W/TWL LRG LVL3 (GOWN DISPOSABLE) ×4 IMPLANT
GUIDEWIRE ANG ZIPWIRE 038X150 (WIRE) IMPLANT
GUIDEWIRE STR DUAL SENSOR (WIRE) ×4 IMPLANT
IV NS IRRIG 3000ML ARTHROMATIC (IV SOLUTION) ×4 IMPLANT
KIT ROOM TURNOVER AP CYSTO (KITS) ×4 IMPLANT
LASER FIBER DISP (UROLOGICAL SUPPLIES) IMPLANT
MANIFOLD NEPTUNE II (INSTRUMENTS) ×4 IMPLANT
MANIFOLD NEPTUNE WASTE (CANNULA) ×4 IMPLANT
PACK CYSTO (CUSTOM PROCEDURE TRAY) ×4 IMPLANT
PAD ARMBOARD 7.5X6 YLW CONV (MISCELLANEOUS) ×4 IMPLANT
PAD TELFA 3X4 1S STER (GAUZE/BANDAGES/DRESSINGS) ×4 IMPLANT
TOWEL OR 17X26 4PK STRL BLUE (TOWEL DISPOSABLE) IMPLANT
WATER STERILE IRR 1000ML UROMA (IV SOLUTION) ×4 IMPLANT
WATER STERILE IRR 3000ML UROMA (IV SOLUTION) ×4 IMPLANT

## 2015-08-22 NOTE — Discharge Instructions (Signed)
CYSTOSCOPY HOME CARE INSTRUCTIONS ° °Activity: °Rest for the remainder of the day.  Do not drive or operate equipment today.  You may resume normal activities in one to two days as instructed by your physician.  ° °Meals: °Drink plenty of liquids and eat light foods such as gelatin or soup this evening.  You may return to a normal meal plan tomorrow. ° °Return to Work: °You may return to work in one to two days or as instructed by your physician. ° °Special Instructions / Symptoms: °Call your physician if any of these symptoms occur: ° ° -persistent or heavy bleeding ° -bleeding which continues after first few urination ° -large blood clots that are difficult to pass ° -urine stream diminishes or stops completely ° -fever equal to or higher than 101 degrees Farenheit. ° -cloudy urine with a strong, foul odor ° -severe pain ° °Females should always wipe from front to back after elimination.  You may feel some burning pain when you urinate.  This should disappear with time.  Applying moist heat to the lower abdomen or a hot tub bath may help relieve the pain. \ ° ° °Call for an appointment to arrange follow-up. ° °Patient Signature:  ________________________________________________________ ° °Nurse's Signature:  ________________________________________________________ ° °

## 2015-08-22 NOTE — Interval H&P Note (Signed)
History and Physical Interval Note:  08/22/2015 11:56 AM  Andrew [name]  has presented today for surgery, with the diagnosis of bladder tumor  The various methods of treatment have been discussed with the patient and family. After consideration of risks, benefits and other options for treatment, the patient has consented to  Procedure(s): CYSTOSCOPY WITH RETROGRADE PYELOGRAM (Bilateral) CYSTOSCOPY WITH BLADDER BIOPSY (N/A) as a surgical intervention .  The patient's history has been reviewed, patient examined, no change in status, stable for surgery.  I have reviewed the patient's chart and labs.  Questions were answered to the patient's satisfaction.     Esty Ahuja J

## 2015-08-22 NOTE — Anesthesia Preprocedure Evaluation (Signed)
Anesthesia Evaluation  Patient identified by MRN, date of birth, ID band Patient awake    Reviewed: Allergy & Precautions, NPO status , Patient's Chart, lab work & pertinent test results  History of Anesthesia Complications (+) PROLONGED EMERGENCE and history of anesthetic complications ("slow to wake up")  Airway Mallampati: II  TM Distance: >3 FB     Dental  (+) Partial Lower   Pulmonary Current Smoker, PE: am cough.   breath sounds clear to auscultation       Cardiovascular negative cardio ROS   Rhythm:Regular Rate:Normal     Neuro/Psych Tremors - taking propanolol CVA, No Residual Symptoms    GI/Hepatic GERD  Medicated and Controlled,  Endo/Other  diabetes, Type 2, Oral Hypoglycemic Agents  Renal/GU      Musculoskeletal   Abdominal   Peds  Hematology   Anesthesia Other Findings   Reproductive/Obstetrics                             Anesthesia Physical Anesthesia Plan  ASA: III  Anesthesia Plan: General   Post-op Pain Management:    Induction: Intravenous  Airway Management Planned: LMA  Additional Equipment:   Intra-op Plan:   Post-operative Plan: Extubation in OR  Informed Consent: I have reviewed the patients History and Physical, chart, labs and discussed the procedure including the risks, benefits and alternatives for the proposed anesthesia with the patient or authorized representative who has indicated his/her understanding and acceptance.     Plan Discussed with:   Anesthesia Plan Comments:         Anesthesia Quick Evaluation

## 2015-08-22 NOTE — Anesthesia Procedure Notes (Signed)
Procedure Name: LMA Insertion Date/Time: 08/22/2015 12:19 PM Performed by: Charmaine Downs Pre-anesthesia Checklist: Patient identified, Emergency Drugs available, Suction available and Patient being monitored Patient Re-evaluated:Patient Re-evaluated prior to inductionOxygen Delivery Method: Circle system utilized Preoxygenation: Pre-oxygenation with 100% oxygen Intubation Type: IV induction LMA: LMA inserted LMA Size: 5.0 Grade View: Grade II Number of attempts: 1 Placement Confirmation: breath sounds checked- equal and bilateral and positive ETCO2 Tube secured with: Tape Dental Injury: Teeth and Oropharynx as per pre-operative assessment

## 2015-08-22 NOTE — Transfer of Care (Signed)
Immediate Anesthesia Transfer of Care Note  Patient: Andrew [name]  Procedure(s) Performed: Procedure(s): CYSTOSCOPY WITH RETROGRADE PYELOGRAM (Bilateral) CYSTOSCOPY WITH BLADDER BIOPSY (N/A)  Patient Location: PACU  Anesthesia Type:General  Level of Consciousness: sedated  Airway & Oxygen Therapy: Patient Spontanous Breathing and Patient connected to face mask oxygen  Post-op Assessment: Report given to RN, Post -op Vital signs reviewed and stable and Patient moving all extremities  Post vital signs: Reviewed and stable  Last Vitals:  Filed Vitals:   08/22/15 1055 08/22/15 1201  BP: 131/84 132/79  Pulse:    Temp:    Resp: 17 19    Complications: No apparent anesthesia complications

## 2015-08-22 NOTE — Anesthesia Postprocedure Evaluation (Signed)
Anesthesia Post Note  Patient: Derion Hollifield  Procedure(s) Performed: Procedure(s) (LRB): CYSTOSCOPY WITH RETROGRADE PYELOGRAM (Bilateral) CYSTOSCOPY WITH BLADDER BIOPSY (N/A)  Patient location during evaluation: PACU Anesthesia Type: General Level of consciousness: awake and patient cooperative Pain management: pain level controlled Vital Signs Assessment: post-procedure vital signs reviewed and stable Respiratory status: respiratory function stable and spontaneous breathing Cardiovascular status: blood pressure returned to baseline Postop Assessment: no signs of nausea or vomiting Anesthetic complications: no    Last Vitals:  Filed Vitals:   08/22/15 1201 08/22/15 1315  BP: 132/79   Pulse:    Temp:  36.4 C  Resp: 19     Last Pain:  Filed Vitals:   08/22/15 1319  PainSc: Asleep                 Phuong Moffatt J

## 2015-08-22 NOTE — Brief Op Note (Signed)
08/22/2015  12:54 PM  PATIENT:  Andrew [name]  65 y.o. male  PRE-OPERATIVE DIAGNOSIS:  bladder tumor  POST-OPERATIVE DIAGNOSIS:  bladder tumor right bladder neck 100mm  PROCEDURE:  Procedure(s): CYSTOSCOPY WITH RETROGRADE PYELOGRAM (Bilateral) CYSTOSCOPY WITH BLADDER BIOPSY (N/A)x2  SURGEON:  Surgeon(s) and Role:    * Irine Seal, MD - Primary  PHYSICIAN ASSISTANT:   ASSISTANTS: none   ANESTHESIA:   general  EBL:  Total I/O In: 400 [I.V.:400] Out: -   BLOOD ADMINISTERED:none  DRAINS: none   LOCAL MEDICATIONS USED:  NONE  SPECIMEN:  Source of Specimen:  right bladder wall and bladder neck  DISPOSITION OF SPECIMEN:  PATHOLOGY  COUNTS:  YES  TOURNIQUET:  * No tourniquets in log *  DICTATION: .Other Dictation: Dictation Number [id number]  PLAN OF CARE: Discharge to home after PACU  PATIENT DISPOSITION:  PACU - hemodynamically stable.   Delay start of Pharmacological VTE agent (>24hrs) due to surgical blood loss or risk of bleeding: not applicable

## 2015-08-23 NOTE — Op Note (Signed)
Andrew [name], BAUER NO.:  [id number]  MEDICAL RECORD NO.:  [id number]  LOCATION:  [facility]                          FACILITY:  [facility]  PHYSICIAN:  Marshall Cork. Jeffie Pollock, M.D.    DATE OF BIRTH:  [birth year]  DATE OF PROCEDURE:  08/22/2015 DATE OF DISCHARGE:  08/22/2015                              OPERATIVE REPORT   PROCEDURE: 1. Cystoscopy with bilateral retrograde pyelograms interpretation. 2. Bladder biopsy from right lateral wall. 3. Bladder biopsy with fulguration of an 8 mm right bladder neck     tumor.  PREOPERATIVE DIAGNOSIS:  Right bladder neck tumor and erythematous bladder wall lesion.  POSTOPERATIVE DIAGNOSIS:  Right bladder neck tumor and erythematous bladder wall lesion.  SURGEON:  Marshall Cork. Jeffie Pollock, MD  ANESTHESIA:  General.  SPECIMEN:  Biopsies from the right lateral wall and bladder neck.  BLOOD LOSS:  Minimal.  DRAINS:  None.  COMPLICATIONS:  None.  INDICATIONS:  Mr. Bienaime is a 65 year old white male with a history of bladder cancer.  Recently, he saw me to resume followup after moving to the area.  He had cystoscopy, which revealed a small lesion at the right bladder neck and an erythematous lesion on the posterior bladder wall. It was felt that cysto bilateral retrograde pyelography and biopsy was indicated.  FINDINGS AND PROCEDURE:  He was taken to the operating room where general anesthetic was induced.  He was given antibiotic.  He was placed in lithotomy position and fitted with PAS hose.  His perineum and genitalia were prepped with Betadine solution.  He was draped in usual sterile fashion.  Cystoscopy was performed using a 23-French scope and 30 and 70 degree lenses.  Examination revealed a normal urethra.  The external sphincter was intact.  The prostatic urethra was approximately 3 cm in length with bilobar hyperplasia, almost a small middle lobe.  At the right bladder neck, there was a small papillary lesion approximately 8 mm in  size.  Inspection of the bladder revealed the ureteral orifices in the normal anatomic position.  There was moderate trabeculation with a few cellules.  There was some evidence of prior resection and scarring primarily at the left base and lateral wall.  On the right lateral wall, there was an area of scar with an adjacent approximately 3 mm lesion that appeared most consistent with lymphoid follicle, but the small tumor was a possibility as well.  Once initial inspection had been performed, bilateral retrograde pyelograms were performed using a 5-French open-end catheter and Omnipaque.  The left retrograde pyelogram demonstrated a normal ureter and intrarenal collecting system.  Right retrograde pyelogram revealed a normal ureter and intrarenal collecting system.  After completion of retrograde pyelogram, cup biopsy forceps was used to biopsy the right lateral wall lesion and the bladder neck lesions.  Some of the tumor material at the right bladder neck had been sheared off just with the initial cystoscopy, but the residual tissue was biopsied.  At this point, a Bugbee electrode was used to fulgurate the biopsy sites.  Final inspection revealed no significant bleeding or bladder wall injury.  The bladder was drained.  The cystoscope was removed.  The patient was taken  down from lithotomy position.  His anesthetic was reversed.  He was moved to recovery room in stable condition.  There were no complications.     Marshall Cork. Jeffie Pollock, M.D.     JJW/MEDQ  D:  08/22/2015  T:  08/23/2015  Job:  [id number]

## 2015-09-12 ENCOUNTER — Ambulatory Visit (INDEPENDENT_AMBULATORY_CARE_PROVIDER_SITE_OTHER): Payer: Medicare Other | Admitting: Urology

## 2015-09-12 DIAGNOSIS — Z8551 Personal history of malignant neoplasm of bladder: Secondary | ICD-10-CM

## 2015-09-12 DIAGNOSIS — C675 Malignant neoplasm of bladder neck: Secondary | ICD-10-CM | POA: Diagnosis not present

## 2015-11-22 DIAGNOSIS — I639 Cerebral infarction, unspecified: Secondary | ICD-10-CM

## 2015-11-22 HISTORY — DX: Cerebral infarction, unspecified: I63.9

## 2015-12-12 ENCOUNTER — Ambulatory Visit (INDEPENDENT_AMBULATORY_CARE_PROVIDER_SITE_OTHER): Payer: Medicare Other | Admitting: Urology

## 2015-12-12 DIAGNOSIS — R3129 Other microscopic hematuria: Secondary | ICD-10-CM | POA: Diagnosis not present

## 2015-12-19 ENCOUNTER — Ambulatory Visit (INDEPENDENT_AMBULATORY_CARE_PROVIDER_SITE_OTHER): Payer: Medicare Other | Admitting: Pediatrics

## 2015-12-19 ENCOUNTER — Encounter: Payer: Self-pay | Admitting: Pediatrics

## 2015-12-19 VITALS — BP 127/82 | HR 91 | Temp 97.7°F | Ht 75.0 in | Wt 224.0 lb

## 2015-12-19 DIAGNOSIS — C679 Malignant neoplasm of bladder, unspecified: Secondary | ICD-10-CM | POA: Diagnosis not present

## 2015-12-19 DIAGNOSIS — G250 Essential tremor: Secondary | ICD-10-CM

## 2015-12-19 DIAGNOSIS — Z1211 Encounter for screening for malignant neoplasm of colon: Secondary | ICD-10-CM | POA: Diagnosis not present

## 2015-12-19 DIAGNOSIS — F17200 Nicotine dependence, unspecified, uncomplicated: Secondary | ICD-10-CM | POA: Diagnosis not present

## 2015-12-19 DIAGNOSIS — Z8673 Personal history of transient ischemic attack (TIA), and cerebral infarction without residual deficits: Secondary | ICD-10-CM | POA: Diagnosis not present

## 2015-12-19 DIAGNOSIS — E785 Hyperlipidemia, unspecified: Secondary | ICD-10-CM

## 2015-12-19 DIAGNOSIS — E119 Type 2 diabetes mellitus without complications: Secondary | ICD-10-CM | POA: Diagnosis not present

## 2015-12-19 DIAGNOSIS — K219 Gastro-esophageal reflux disease without esophagitis: Secondary | ICD-10-CM | POA: Diagnosis not present

## 2015-12-19 DIAGNOSIS — E1159 Type 2 diabetes mellitus with other circulatory complications: Secondary | ICD-10-CM | POA: Insufficient documentation

## 2015-12-19 DIAGNOSIS — E11319 Type 2 diabetes mellitus with unspecified diabetic retinopathy without macular edema: Secondary | ICD-10-CM | POA: Diagnosis not present

## 2015-12-19 HISTORY — DX: Hyperlipidemia, unspecified: E78.5

## 2015-12-19 LAB — BAYER DCA HB A1C WAIVED: HB A1C: 7.4 % — AB (ref <7.0)

## 2015-12-19 MED ORDER — OMEPRAZOLE 20 MG PO CPDR: 20.0000 mg | DELAYED_RELEASE_CAPSULE | Freq: Every day | ORAL | 2 refills | Status: DC

## 2015-12-19 MED ORDER — PRIMIDONE 50 MG PO TABS: 50.0000 mg | ORAL_TABLET | Freq: Every day | ORAL | 2 refills | Status: DC

## 2015-12-19 MED ORDER — METFORMIN HCL 500 MG PO TABS: 1000.0000 mg | ORAL_TABLET | Freq: Two times a day (BID) | ORAL | 2 refills | Status: DC

## 2015-12-19 MED ORDER — BUPROPION HCL ER (SR) 150 MG PO TB12: 150.0000 mg | ORAL_TABLET | Freq: Two times a day (BID) | ORAL | 3 refills | Status: DC

## 2015-12-19 MED ORDER — PRAVASTATIN SODIUM 80 MG PO TABS: 80.0000 mg | ORAL_TABLET | Freq: Every day | ORAL | 3 refills | Status: DC

## 2015-12-19 MED ORDER — GLIMEPIRIDE 4 MG PO TABS: 8.0000 mg | ORAL_TABLET | Freq: Every day | ORAL | 2 refills | Status: DC

## 2015-12-19 MED ORDER — DULAGLUTIDE 0.75 MG/0.5ML ~~LOC~~ SOAJ: 0.7500 mg | SUBCUTANEOUS | 3 refills | Status: DC

## 2015-12-19 NOTE — Progress Notes (Signed)
Subjective:    Patient ID: Andrew [name], male    DOB: 08/13/1950, 66 y.o.   MRN: [id number]  CC: Follow-up multiple med problems  HPI: Andrew [name] is a 65 y.o. male presenting for Follow-up  Bladder cancer: recurrence Followed by Dr. Jeffie Pollock Has upcoming procedure New patch in bladder, question of invokana as a cause  DM2: checks occasionally at home 130s-150s Sometimes into 180s 1-2 x a week A couple of times into the 200s On glimeperide, metformin, invokana  H/o stroke: asymptomatic at the time, found on CT scan On aspirin, statin  Essential tremors: comes and goes Off of propranolol for 6 weeks, too expensive Symptoms more prominent  Chest tightness: has a feeling that lasts a few minutes comes on when sitting still, has happened occasionally nothing recent Never has pain, SOB, nausea with exertion Has been doing a lot of yard work recently, no trouble or symptoms  Last colonoscopy in [place] was 10 yrs ago  Tobacco: continues to smoke daily Interested in quitting Says he cant quite get his mind around it Has tried chantix in the past, caused bad dreams  No headaches, no vision changes Nodule on back   Fam hx: no colon ca Breast ca in women  Depression screen [facility] 2/9 12/19/2015 06/13/2015  Decreased Interest 0 0  Down, Depressed, Hopeless 0 0  PHQ - 2 Score 0 0   Relevant past medical, surgical, family and social history reviewed and updated. Interim medical history since our last visit reviewed. Allergies and medications reviewed and updated.  History  Smoking Status  . Current Every Day Smoker  . Packs/day: 1.00  . Years: 42.00  . Types: Cigarettes  Smokeless Tobacco  . Not on file   ROS: Per HPI     Objective:    BP 127/82 (BP Location: Left Arm, Patient Position: Sitting, Cuff Size: Normal)   Pulse 91   Temp 97.7 F (36.5 C) (Oral)   Ht 6\' 3"  (1.905 m)   Wt 224 lb (101.6 kg)   BMI 28.00 kg/m   Wt Readings from Last 3 Encounters:    12/19/15 224 lb (101.6 kg)  08/22/15 223 lb (101.2 kg)  08/18/15 223 lb (101.2 kg)     Gen: NAD, alert, cooperative with exam, NCAT EYES: EOMI, no scleral injection or icterus CV: NRRR, normal S1/S2, no murmur, distal pulses 2+ b/l Resp: CTABL, no wheezes, normal WOB Ext: No edema, warm Neuro: Alert and oriented MSK: normal muscle bulk Skin: discrete, mobile, soft nodule under the skin, apprx 1.5cm R flank, non tender, non-erythematous, no fluctuance     Assessment & Plan:    Jarian was seen today for follow-up multiple med problems.  Diagnoses and all orders for this visit:  Screen for colon cancer Last colonoscopy 10 yrs ago in [place] Does not want to do colonoscopy now Will need below yearly -     Fecal occult blood, imunochemical; Future  Tobacco use disorder Pre-contemplative, but very interested in quitting Start wellbutrin Hg increased last check, repeat Likely due to smoking -     CBC with Differential -     buPROPion (WELLBUTRIN SR) 150 MG 12 hr tablet; Take 1 tablet (150 mg total) by mouth 2 (two) times daily.  Type 2 diabetes mellitus without complication, without long-term current use of insulin [facility]) Urology concerned invokana causing bladder changes, will switch agents, try below. Let me know if too expensive. Continue glimepiride, metformin Needs eye exam -  Bayer DCA Hb A1c Waived -     Dulaglutide 0.75 MG/0.5ML SOPN; Inject 0.75 mg into the skin once a week. -     glimepiride (AMARYL) 4 MG tablet; Take 2 tablets (8 mg total) by mouth daily with breakfast. -     metFORMIN (GLUCOPHAGE) 500 MG tablet; Take 2 tablets (1,000 mg total) by mouth 2 (two) times daily with a meal.  Hyperlipidemia Off of fenofibrate for a few months due to expense Fasting this morning, will get lipid panel Has muscle aches, swithc to pravastatin -     Lipid panel -     pravastatin (PRAVACHOL) 80 MG tablet; Take 1 tablet (80 mg total) by mouth daily.  Malignant neoplasm of  urinary bladder, unspecified site [facility]) Followed by urology, has upcoming procedure  Gastroesophageal reflux disease, esophagitis presence not specified Well ocntrolled with below, can skip one day, notices more symptosm if skips two -     omeprazole (PRILOSEC) 20 MG capsule; Take 1 capsule (20 mg total) by mouth daily.  Essential tremor Cant afford propranolol Try below Can try BID propranolol to see if cheaper if below is not helpful -     primidone (MYSOLINE) 50 MG tablet; Take 1 tablet (50 mg total) by mouth daily.  History of stroke Cont asa, statin   Follow up plan: 6 months  Assunta Found, MD [facility] 12/19/2015, 10:33 AM

## 2015-12-20 LAB — CBC WITH DIFFERENTIAL/PLATELET
BASOS ABS: 0.1 10*3/uL (ref 0.0–0.2)
Basos: 1 %
EOS (ABSOLUTE): 0.5 10*3/uL — ABNORMAL HIGH (ref 0.0–0.4)
EOS: 6 %
HEMATOCRIT: 51.7 % — AB (ref 37.5–51.0)
HEMOGLOBIN: 18.2 g/dL — AB (ref 12.6–17.7)
IMMATURE GRANS (ABS): 0 10*3/uL (ref 0.0–0.1)
IMMATURE GRANULOCYTES: 0 %
LYMPHS: 30 %
Lymphocytes Absolute: 2.4 10*3/uL (ref 0.7–3.1)
MCH: 32.9 pg (ref 26.6–33.0)
MCHC: 35.2 g/dL (ref 31.5–35.7)
MCV: 94 fL (ref 79–97)
MONOCYTES: 10 %
Monocytes Absolute: 0.8 10*3/uL (ref 0.1–0.9)
NEUTROS PCT: 53 %
Neutrophils Absolute: 4.2 10*3/uL (ref 1.4–7.0)
Platelets: 165 10*3/uL (ref 150–379)
RBC: 5.53 x10E6/uL (ref 4.14–5.80)
RDW: 13.2 % (ref 12.3–15.4)
WBC: 7.9 10*3/uL (ref 3.4–10.8)

## 2015-12-20 LAB — LIPID PANEL
CHOLESTEROL TOTAL: 161 mg/dL (ref 100–199)
Chol/HDL Ratio: 3.8 ratio units (ref 0.0–5.0)
HDL: 42 mg/dL (ref >39)
LDL Calculated: 70 mg/dL (ref 0–99)
Triglycerides: 246 mg/dL — ABNORMAL HIGH (ref 0–149)
VLDL CHOLESTEROL CAL: 49 mg/dL — AB (ref 5–40)

## 2016-01-01 ENCOUNTER — Other Ambulatory Visit: Payer: Self-pay

## 2016-01-01 DIAGNOSIS — N329 Bladder disorder, unspecified: Secondary | ICD-10-CM

## 2016-01-12 NOTE — Patient Instructions (Signed)
Andrew [name]  01/12/2016     @PREFPERIOPPHARMACY @   Your procedure is scheduled on 01/16/2016  Report to [facility] at 10:15 A.M.  Call this number if you have problems the morning of surgery:  [phone]   Remember:  Do not eat food or drink liquids after midnight.  Take these medicines the morning of surgery with A SIP OF WATER Wellbutrin, Prilosec, Primidone  DO NOT TAKE DIABETIC MEDICATIONS MORNING OF PROCEDURE   Do not wear jewelry, make-up or nail polish.  Do not wear lotions, powders, or perfumes.  You may wear deoderant.  Do not shave 48 hours prior to surgery.  Men may shave face and neck.  Do not bring valuables to the hospital.  [facility] is not responsible for any belongings or valuables.  Contacts, dentures or bridgework may not be worn into surgery.  Leave your suitcase in the car.  After surgery it may be brought to your room.  For patients admitted to the hospital, discharge time will be determined by your treatment team.  Patients discharged the day of surgery will not be allowed to drive home.    Please read over the following fact sheets that you were given. Surgical Site Infection Prevention and Anesthesia Post-op Instructions     PATIENT INSTRUCTIONS POST-ANESTHESIA  IMMEDIATELY FOLLOWING SURGERY:  Do not drive or operate machinery for the first twenty four hours after surgery.  Do not make any important decisions for twenty four hours after surgery or while taking narcotic pain medications or sedatives.  If you develop intractable nausea and vomiting or a severe headache please notify your doctor immediately.  FOLLOW-UP:  Please make an appointment with your surgeon as instructed. You do not need to follow up with anesthesia unless specifically instructed to do so.  WOUND CARE INSTRUCTIONS (if applicable):  Keep a dry clean dressing on the anesthesia/puncture wound site if there is drainage.  Once the wound has quit draining you may leave it open  to air.  Generally you should leave the bandage intact for twenty four hours unless there is drainage.  If the epidural site drains for more than 36-48 hours please call the anesthesia department.  QUESTIONS?:  Please feel free to call your physician or the hospital operator if you have any questions, and they will be happy to assist you.      Cystoscopy Cystoscopy is a procedure that is used to help your caregiver diagnose and sometimes treat conditions that affect your lower urinary tract. Your lower urinary tract includes your bladder and the tube through which urine passes from your bladder out of your body (urethra). Cystoscopy is performed with a thin, tube-shaped instrument (cystoscope). The cystoscope has lenses and a light at the end so that your caregiver can see inside your bladder. The cystoscope is inserted at the entrance of your urethra. Your caregiver guides it through your urethra and into your bladder. There are two main types of cystoscopy:  Flexible cystoscopy (with a flexible cystoscope).  Rigid cystoscopy (with a rigid cystoscope). Cystoscopy may be recommended for many conditions, including:  Urinary tract infections.  Blood in your urine (hematuria).  Loss of bladder control (urinary incontinence) or overactive bladder.  Unusual cells found in a urine sample.  Urinary blockage.  Painful urination. Cystoscopy may also be done to remove a sample of your tissue to be checked under a microscope (biopsy). It may also be done to remove or destroy bladder stones. LET YOUR CAREGIVER KNOW ABOUT:  Allergies to food or medicine.  Medicines taken, including vitamins, herbs, eyedrops, over-the-counter medicines, and creams.  Use of steroids (by mouth or creams).  Previous problems with anesthetics or numbing medicines.  History of bleeding problems or blood clots.  Previous surgery.  Other health problems, including diabetes and kidney problems.  Possibility of  pregnancy, if this applies. PROCEDURE The area around the opening to your urethra will be cleaned. A medicine to numb your urethra (local anesthetic) is used. If a tissue sample or stone is removed during the procedure, you may be given a medicine to make you sleep (general anesthetic). Your caregiver will gently insert the tip of the cystoscope into your urethra. The cystoscope will be slowly glided through your urethra and into your bladder. Sterile fluid will flow through the cystoscope and into your bladder. The fluid will expand and stretch your bladder. This gives your caregiver a better view of your bladder walls. The procedure lasts about 15-20 minutes. AFTER THE PROCEDURE If a local anesthetic is used, you will be allowed to go home as soon as you are ready. If a general anesthetic is used, you will be taken to a recovery area until you are stable. You may have temporary bleeding and burning on urination.   This information is not intended to replace advice given to you by your health care provider. Make sure you discuss any questions you have with your health care provider.   Document Released: 05/07/2000 Document Revised: 05/31/2014 Document Reviewed: 11/01/2011 Elsevier Interactive Patient Education [employer].

## 2016-01-13 ENCOUNTER — Encounter (HOSPITAL_COMMUNITY): Payer: Self-pay

## 2016-01-13 ENCOUNTER — Ambulatory Visit (HOSPITAL_COMMUNITY)
Admission: RE | Admit: 2016-01-13 | Discharge: 2016-01-13 | Disposition: A | Discharge status: Home / Self Care | Payer: Medicare Other | Source: Ambulatory Visit | Attending: Urology | Admitting: Urology

## 2016-01-13 DIAGNOSIS — Z01812 Encounter for preprocedural laboratory examination: Secondary | ICD-10-CM | POA: Insufficient documentation

## 2016-01-13 HISTORY — DX: Major depressive disorder, single episode, unspecified: F32.9

## 2016-01-13 HISTORY — DX: Anemia, unspecified: D64.9

## 2016-01-13 HISTORY — DX: Myoneural disorder, unspecified: G70.9

## 2016-01-13 HISTORY — DX: Unspecified osteoarthritis, unspecified site: M19.90

## 2016-01-13 HISTORY — DX: Depression, unspecified: F32.A

## 2016-01-13 HISTORY — DX: Gastro-esophageal reflux disease without esophagitis: K21.9

## 2016-01-13 LAB — CBC
HEMATOCRIT: 50.9 % (ref 39.0–52.0)
Hemoglobin: 17.6 g/dL — ABNORMAL HIGH (ref 13.0–17.0)
MCH: 32.1 pg (ref 26.0–34.0)
MCHC: 34.6 g/dL (ref 30.0–36.0)
MCV: 92.7 fL (ref 78.0–100.0)
Platelets: 185 10*3/uL (ref 150–400)
RBC: 5.49 MIL/uL (ref 4.22–5.81)
RDW: 12.7 % (ref 11.5–15.5)
WBC: 9.5 10*3/uL (ref 4.0–10.5)

## 2016-01-13 LAB — BASIC METABOLIC PANEL
ANION GAP: 9 (ref 5–15)
BUN: 14 mg/dL (ref 6–20)
CALCIUM: 9.3 mg/dL (ref 8.9–10.3)
CO2: 25 mmol/L (ref 22–32)
Chloride: 102 mmol/L (ref 101–111)
Creatinine, Ser: 0.65 mg/dL (ref 0.61–1.24)
Glucose, Bld: 148 mg/dL — ABNORMAL HIGH (ref 65–99)
POTASSIUM: 4.5 mmol/L (ref 3.5–5.1)
Sodium: 136 mmol/L (ref 135–145)

## 2016-01-14 ENCOUNTER — Telehealth: Payer: Self-pay | Admitting: Pediatrics

## 2016-01-14 ENCOUNTER — Encounter (HOSPITAL_COMMUNITY): Payer: Self-pay | Admitting: Anesthesiology

## 2016-01-14 NOTE — Telephone Encounter (Signed)
Called back, pt bring med list and coming in tomorrow 755-8am for an appt

## 2016-01-14 NOTE — Anesthesia Preprocedure Evaluation (Addendum)
Anesthesia Evaluation  Patient identified by MRN, date of birth, ID band Patient awake    Reviewed: Allergy & Precautions, NPO status , Patient's Chart, lab work & pertinent test results  History of Anesthesia Complications (+) PROLONGED EMERGENCE and history of anesthetic complications ("slow to wake up")  Airway Mallampati: II  TM Distance: >3 FB     Dental  (+) Partial Lower   Pulmonary Current Smoker, PE: am cough.   breath sounds clear to auscultation       Cardiovascular negative cardio ROS   Rhythm:Regular Rate:Normal     Neuro/Psych PSYCHIATRIC DISORDERS Depression Benign essential tremor  Neuromuscular disease CVA, No Residual Symptoms    GI/Hepatic Neg liver ROS, GERD  Medicated and Controlled,  Endo/Other  diabetes, Well Controlled, Type 2, Oral Hypoglycemic AgentsHyperlipidemia  Renal/GU negative Renal ROS   Bladder Ca    Musculoskeletal  (+) Arthritis , Osteoarthritis,    Abdominal Normal abdominal exam  (+)   Peds  Hematology  (+) anemia ,   Anesthesia Other Findings   Reproductive/Obstetrics                            Lab Results  Component Value Date   WBC 9.5 01/13/2016   HGB 17.6 (H) 01/13/2016   HCT 50.9 01/13/2016   MCV 92.7 01/13/2016   PLT 185 01/13/2016     Chemistry      Component Value Date/Time   NA 136 01/13/2016 0920   NA 141 06/13/2015 1116   K 4.5 01/13/2016 0920   CL 102 01/13/2016 0920   CO2 25 01/13/2016 0920   BUN 14 01/13/2016 0920   BUN 17 06/13/2015 1116   CREATININE 0.65 01/13/2016 0920      Component Value Date/Time   CALCIUM 9.3 01/13/2016 0920   ALKPHOS 61 06/13/2015 1116   AST 22 06/13/2015 1116   ALT 29 06/13/2015 1116   BILITOT 0.5 06/13/2015 1116    EKG: normal EKG, normal sinus rhythm.   Anesthesia Physical  Anesthesia Plan  ASA: III  Anesthesia Plan: General   Post-op Pain Management:    Induction:  Intravenous  Airway Management Planned: LMA  Additional Equipment:   Intra-op Plan:   Post-operative Plan: Extubation in OR  Informed Consent: I have reviewed the patients History and Physical, chart, labs and discussed the procedure including the risks, benefits and alternatives for the proposed anesthesia with the patient or authorized representative who has indicated his/her understanding and acceptance.     Plan Discussed with:   Anesthesia Plan Comments:         Anesthesia Quick Evaluation

## 2016-01-15 ENCOUNTER — Ambulatory Visit (INDEPENDENT_AMBULATORY_CARE_PROVIDER_SITE_OTHER): Payer: Medicare Other | Admitting: Pediatrics

## 2016-01-15 ENCOUNTER — Encounter: Payer: Self-pay | Admitting: Pediatrics

## 2016-01-15 VITALS — BP 127/78 | HR 95 | Temp 96.9°F | Ht 75.0 in | Wt 230.0 lb

## 2016-01-15 DIAGNOSIS — R601 Generalized edema: Secondary | ICD-10-CM | POA: Diagnosis not present

## 2016-01-15 DIAGNOSIS — R609 Edema, unspecified: Secondary | ICD-10-CM

## 2016-01-15 DIAGNOSIS — E11319 Type 2 diabetes mellitus with unspecified diabetic retinopathy without macular edema: Secondary | ICD-10-CM | POA: Diagnosis not present

## 2016-01-15 DIAGNOSIS — F17200 Nicotine dependence, unspecified, uncomplicated: Secondary | ICD-10-CM | POA: Diagnosis not present

## 2016-01-15 DIAGNOSIS — R635 Abnormal weight gain: Secondary | ICD-10-CM | POA: Diagnosis not present

## 2016-01-15 DIAGNOSIS — G250 Essential tremor: Secondary | ICD-10-CM

## 2016-01-15 DIAGNOSIS — M62830 Muscle spasm of back: Secondary | ICD-10-CM | POA: Diagnosis not present

## 2016-01-15 LAB — URINALYSIS, COMPLETE
Bilirubin, UA: NEGATIVE
Ketones, UA: NEGATIVE
LEUKOCYTES UA: NEGATIVE
Nitrite, UA: NEGATIVE
PH UA: 5 (ref 5.0–7.5)
Specific Gravity, UA: 1.025 (ref 1.005–1.030)
Urobilinogen, Ur: 0.2 mg/dL (ref 0.2–1.0)

## 2016-01-15 LAB — CMP14+EGFR
A/G RATIO: 1.8 (ref 1.2–2.2)
ALT: 18 IU/L (ref 0–44)
AST: 12 IU/L (ref 0–40)
Albumin: 4.4 g/dL (ref 3.6–4.8)
Alkaline Phosphatase: 55 IU/L (ref 39–117)
BILIRUBIN TOTAL: 0.3 mg/dL (ref 0.0–1.2)
BUN/Creatinine Ratio: 27 — ABNORMAL HIGH (ref 10–24)
BUN: 16 mg/dL (ref 8–27)
CALCIUM: 9.9 mg/dL (ref 8.6–10.2)
CHLORIDE: 98 mmol/L (ref 96–106)
CO2: 19 mmol/L (ref 18–29)
Creatinine, Ser: 0.6 mg/dL — ABNORMAL LOW (ref 0.76–1.27)
GFR calc non Af Amer: 106 mL/min/{1.73_m2} (ref >59)
GFR, EST AFRICAN AMERICAN: 122 mL/min/{1.73_m2} (ref >59)
GLUCOSE: 150 mg/dL — AB (ref 65–99)
Globulin, Total: 2.5 g/dL (ref 1.5–4.5)
POTASSIUM: 4.6 mmol/L (ref 3.5–5.2)
Sodium: 138 mmol/L (ref 134–144)
TOTAL PROTEIN: 6.9 g/dL (ref 6.0–8.5)

## 2016-01-15 LAB — MICROSCOPIC EXAMINATION
BACTERIA UA: NONE SEEN
EPITHELIAL CELLS (NON RENAL): NONE SEEN /HPF (ref 0–10)
RBC, UA: NONE SEEN /hpf (ref 0–?)
WBC, UA: NONE SEEN /hpf (ref 0–?)

## 2016-01-15 MED ORDER — FUROSEMIDE 20 MG PO TABS: 20.0000 mg | ORAL_TABLET | Freq: Every day | ORAL | 1 refills | Status: DC

## 2016-01-15 MED ORDER — METFORMIN HCL 1000 MG PO TABS: 1000.0000 mg | ORAL_TABLET | Freq: Two times a day (BID) | ORAL | 3 refills | Status: DC

## 2016-01-15 MED ORDER — CYCLOBENZAPRINE HCL 10 MG PO TABS: 10.0000 mg | ORAL_TABLET | Freq: Three times a day (TID) | ORAL | 0 refills | Status: DC | PRN

## 2016-01-15 NOTE — Progress Notes (Signed)
Subjective:   Patient ID: Andrew [name], male    DOB: [birth year], 65 y.o.   MRN: [id number] CC: Foot Swelling (started since changing meds ); wt gain; and Back Pain  HPI: Rudra Hobbins is a 65 y.o. male presenting for Foot Swelling (started since changing meds ); wt gain; and Back Pain  Starting a few weeks ago having increasing swelling in LE Gets better by the morning, worse at night Does not go away completely by morning Started a week or so after starting new meds--dulaglutide, primidone No SOB, no trouble breathing No chest pain Feeling well otherwise No change in tolerance of physical activity No h/o swelling in the past No change in activities when swelling started He has had lower back pain starting in the last coupl eof weeks, after the swelling started Feels like a muscle spasm in R side of back Come sand goes Occasionally has sciatic pain down R leg Has had in the past  Relevant past medical, surgical, family and social history reviewed. Allergies and medications reviewed and updated. History  Smoking Status  . Current Every Day Smoker  . Packs/day: 1.00  . Years: 42.00  . Types: Cigarettes  Smokeless Tobacco  . Never Used   ROS: Per HPI   Objective:    BP 127/78 (BP Location: Right Arm, Patient Position: Sitting, Cuff Size: Large)   Pulse 95   Temp (!) 96.9 F (36.1 C) (Oral)   Ht _0  (1.905 m)   Wt 230 lb (104.3 kg)   BMI 28.75 kg/m   Wt Readings from Last 3 Encounters:  01/15/16 230 lb (104.3 kg)  01/13/16 229 lb (103.9 kg)  12/19/15 224 lb (101.6 kg)    Gen: NAD, alert, cooperative with exam, NCAT EYES: EOMI, no conjunctival injection, or no icterus CV: NRRR, normal S1/S2, no murmur, distal pulses 2+ b/l Resp: CTABL, no wheezes, normal WOB Abd: +BS, soft, NTND. no guarding or organomegaly Ext: 1+ pitting edema ankles to mid shin, warm Neuro: Alert and oriented, strength equal b/l UE and LE, coordination grossly normal MSK: normal muscle  bulk  Assessment & Plan:  Boluwatife was seen today for foot swelling, wt gain and back pain.  Diagnoses and all orders for this visit:  Generalized edema Started within week of starting primidone, dulaglutide No change in exercise tolerance, no SOB Will try stopping new meds for a week, get labs, lasix prn for swelling ECHO if no improvement -     Brain natriuretic peptide -     furosemide (LASIX) 20 MG tablet; Take 1 tablet (20 mg total) by mouth daily. -     CMP14+EGFR -     Urinalysis, Complete  Type 2 diabetes mellitus with retinopathy, without long-term current use of insulin, macular edema presence unspecified, unspecified laterality, unspecified retinopathy severity (HCC) Cont metformin Stop dulaglutide for 1 week, see if any change in swelling -     metFORMIN (GLUCOPHAGE) 1000 MG tablet; Take 1 tablet (1,000 mg total) by mouth 2 (two) times daily with a meal.  Back spasm -     cyclobenzaprine (FLEXERIL) 10 MG tablet; Take 1 tablet (10 mg total) by mouth 3 (three) times daily as needed for muscle spasms.  Weight gain apprx 6 lbs over last few weeks, feels like pants fitting tighter  Essential tremor Hold primidone for week , then restart  Tobacco use disorder Has Rx for wellbutrin, hasnt started yet  Other orders -     Microscopic Examination   Follow up  plan: 4 weeks Assunta Found, MD [place]

## 2016-01-16 ENCOUNTER — Ambulatory Visit (HOSPITAL_COMMUNITY): Payer: Medicare Other

## 2016-01-16 ENCOUNTER — Ambulatory Visit (HOSPITAL_COMMUNITY)
Admission: RE | Admit: 2016-01-16 | Discharge: 2016-01-16 | Disposition: A | Discharge status: Home / Self Care | Payer: Medicare Other | Source: Ambulatory Visit | Attending: Urology | Admitting: Urology

## 2016-01-16 ENCOUNTER — Ambulatory Visit (HOSPITAL_COMMUNITY): Payer: Medicare Other | Admitting: Anesthesiology

## 2016-01-16 ENCOUNTER — Encounter (HOSPITAL_COMMUNITY): Payer: Self-pay | Admitting: *Deleted

## 2016-01-16 ENCOUNTER — Encounter (HOSPITAL_COMMUNITY)
Admission: RE | Disposition: A | Discharge status: Home / Self Care | Payer: Self-pay | Source: Ambulatory Visit | Attending: Urology

## 2016-01-16 DIAGNOSIS — R3129 Other microscopic hematuria: Secondary | ICD-10-CM | POA: Insufficient documentation

## 2016-01-16 DIAGNOSIS — M1990 Unspecified osteoarthritis, unspecified site: Secondary | ICD-10-CM | POA: Insufficient documentation

## 2016-01-16 DIAGNOSIS — G250 Essential tremor: Secondary | ICD-10-CM | POA: Insufficient documentation

## 2016-01-16 DIAGNOSIS — Z8673 Personal history of transient ischemic attack (TIA), and cerebral infarction without residual deficits: Secondary | ICD-10-CM | POA: Insufficient documentation

## 2016-01-16 DIAGNOSIS — K219 Gastro-esophageal reflux disease without esophagitis: Secondary | ICD-10-CM | POA: Insufficient documentation

## 2016-01-16 DIAGNOSIS — D649 Anemia, unspecified: Secondary | ICD-10-CM | POA: Diagnosis not present

## 2016-01-16 DIAGNOSIS — N3020 Other chronic cystitis without hematuria: Secondary | ICD-10-CM | POA: Diagnosis not present

## 2016-01-16 DIAGNOSIS — Z79899 Other long term (current) drug therapy: Secondary | ICD-10-CM | POA: Diagnosis not present

## 2016-01-16 DIAGNOSIS — N329 Bladder disorder, unspecified: Secondary | ICD-10-CM | POA: Diagnosis not present

## 2016-01-16 DIAGNOSIS — F329 Major depressive disorder, single episode, unspecified: Secondary | ICD-10-CM | POA: Insufficient documentation

## 2016-01-16 DIAGNOSIS — Z8551 Personal history of malignant neoplasm of bladder: Secondary | ICD-10-CM | POA: Insufficient documentation

## 2016-01-16 DIAGNOSIS — Z833 Family history of diabetes mellitus: Secondary | ICD-10-CM | POA: Insufficient documentation

## 2016-01-16 DIAGNOSIS — F17200 Nicotine dependence, unspecified, uncomplicated: Secondary | ICD-10-CM | POA: Diagnosis not present

## 2016-01-16 DIAGNOSIS — E119 Type 2 diabetes mellitus without complications: Secondary | ICD-10-CM | POA: Insufficient documentation

## 2016-01-16 DIAGNOSIS — D494 Neoplasm of unspecified behavior of bladder: Secondary | ICD-10-CM | POA: Diagnosis not present

## 2016-01-16 DIAGNOSIS — C675 Malignant neoplasm of bladder neck: Secondary | ICD-10-CM | POA: Insufficient documentation

## 2016-01-16 HISTORY — PX: CYSTOSCOPY W/ RETROGRADES: SHX1426

## 2016-01-16 HISTORY — PX: CYSTOSCOPY WITH BIOPSY: SHX5122

## 2016-01-16 HISTORY — PX: CYSTOSCOPY WITH FULGERATION: SHX6638

## 2016-01-16 LAB — GLUCOSE, CAPILLARY
GLUCOSE-CAPILLARY: 147 mg/dL — AB (ref 65–99)
Glucose-Capillary: 104 mg/dL — ABNORMAL HIGH (ref 65–99)

## 2016-01-16 LAB — BRAIN NATRIURETIC PEPTIDE

## 2016-01-16 SURGERY — CYSTOSCOPY, WITH RETROGRADE PYELOGRAM
Anesthesia: General

## 2016-01-16 MED ORDER — GLYCOPYRROLATE 0.2 MG/ML IJ SOLN
INTRAMUSCULAR | Status: AC
  Filled 2016-01-16: qty 3

## 2016-01-16 MED ORDER — DIATRIZOATE MEGLUMINE 30 % UR SOLN
URETHRAL | Status: DC | PRN
  Administered 2016-01-16: 100 mL

## 2016-01-16 MED ORDER — ONDANSETRON HCL 4 MG/2ML IJ SOLN: 4.0000 mg | Freq: Once | INTRAMUSCULAR | Status: DC | PRN

## 2016-01-16 MED ORDER — FENTANYL CITRATE (PF) 100 MCG/2ML IJ SOLN
INTRAMUSCULAR | Status: DC | PRN
  Administered 2016-01-16 (×3): 25 ug via INTRAVENOUS

## 2016-01-16 MED ORDER — CEFAZOLIN IN D5W 1 GM/50ML IV SOLN
1.0000 g | INTRAVENOUS | Status: AC
  Administered 2016-01-16: 1 g via INTRAVENOUS
  Filled 2016-01-16: qty 50

## 2016-01-16 MED ORDER — DIATRIZOATE MEGLUMINE 30 % UR SOLN
URETHRAL | Status: AC
  Filled 2016-01-16: qty 300

## 2016-01-16 MED ORDER — EPHEDRINE SULFATE 50 MG/ML IJ SOLN
INTRAMUSCULAR | Status: AC
  Filled 2016-01-16: qty 1

## 2016-01-16 MED ORDER — NEOSTIGMINE METHYLSULFATE 10 MG/10ML IV SOLN
INTRAVENOUS | Status: AC
  Filled 2016-01-16: qty 1

## 2016-01-16 MED ORDER — FENTANYL CITRATE (PF) 100 MCG/2ML IJ SOLN: 25.0000 ug | INTRAMUSCULAR | Status: DC | PRN

## 2016-01-16 MED ORDER — PROPOFOL 10 MG/ML IV BOLUS
INTRAVENOUS | Status: DC | PRN
  Administered 2016-01-16: 130 mg via INTRAVENOUS
  Administered 2016-01-16: 10 mg via INTRAVENOUS
  Administered 2016-01-16 (×2): 20 mg via INTRAVENOUS

## 2016-01-16 MED ORDER — CHLORHEXIDINE GLUCONATE CLOTH 2 % EX PADS: 6.0000 | MEDICATED_PAD | Freq: Once | CUTANEOUS | Status: DC

## 2016-01-16 MED ORDER — STERILE WATER FOR IRRIGATION IR SOLN
Status: DC | PRN
  Administered 2016-01-16: 3000 mL

## 2016-01-16 MED ORDER — HYDROCODONE-ACETAMINOPHEN 5-325 MG PO TABS: 1.0000 | ORAL_TABLET | Freq: Four times a day (QID) | ORAL | 0 refills | Status: DC | PRN

## 2016-01-16 MED ORDER — ONDANSETRON HCL 4 MG/2ML IJ SOLN
INTRAMUSCULAR | Status: AC
  Filled 2016-01-16: qty 2

## 2016-01-16 MED ORDER — LACTATED RINGERS IV SOLN
INTRAVENOUS | Status: DC | PRN
  Administered 2016-01-16: 12:00:00 via INTRAVENOUS

## 2016-01-16 MED ORDER — SODIUM CHLORIDE 0.9 % IJ SOLN
INTRAMUSCULAR | Status: AC
  Filled 2016-01-16: qty 10

## 2016-01-16 MED ORDER — LIDOCAINE HCL (PF) 1 % IJ SOLN
INTRAMUSCULAR | Status: AC
  Filled 2016-01-16: qty 5

## 2016-01-16 MED ORDER — LIDOCAINE HCL (CARDIAC) 10 MG/ML IV SOLN
INTRAVENOUS | Status: DC | PRN
  Administered 2016-01-16: 30 mg via INTRAVENOUS

## 2016-01-16 MED ORDER — FENTANYL CITRATE (PF) 100 MCG/2ML IJ SOLN
INTRAMUSCULAR | Status: AC
  Filled 2016-01-16: qty 2

## 2016-01-16 MED ORDER — MIDAZOLAM HCL 2 MG/2ML IJ SOLN
INTRAMUSCULAR | Status: DC | PRN
  Administered 2016-01-16 (×2): 1 mg via INTRAVENOUS

## 2016-01-16 MED ORDER — MEPERIDINE HCL 50 MG/ML IJ SOLN: 6.2500 mg | INTRAMUSCULAR | Status: DC | PRN

## 2016-01-16 MED ORDER — ONDANSETRON HCL 4 MG/2ML IJ SOLN
INTRAMUSCULAR | Status: DC | PRN
  Administered 2016-01-16: 4 mg via INTRAVENOUS

## 2016-01-16 MED ORDER — STERILE WATER FOR IRRIGATION IR SOLN
Status: DC | PRN
  Administered 2016-01-16: 1000 mL

## 2016-01-16 MED ORDER — PROPOFOL 10 MG/ML IV BOLUS
INTRAVENOUS | Status: AC
  Filled 2016-01-16: qty 20

## 2016-01-16 SURGICAL SUPPLY — 23 items
BAG DRAIN URO TABLE W/ADPT NS (DRAPE) ×4 IMPLANT
BAG HAMPER (MISCELLANEOUS) ×4 IMPLANT
CATH URET 5FR 28IN OPEN ENDED (CATHETERS) IMPLANT
CLOTH BEACON ORANGE TIMEOUT ST (SAFETY) ×4 IMPLANT
ELECT LOOP 22F BIPOLAR SML (ELECTROSURGICAL) ×4
ELECT REM PT RETURN 9FT ADLT (ELECTROSURGICAL) ×4
ELECTRODE LOOP 22F BIPOLAR SML (ELECTROSURGICAL) ×2 IMPLANT
ELECTRODE REM PT RTRN 9FT ADLT (ELECTROSURGICAL) ×2 IMPLANT
GLOVE BIOGEL M 7.0 STRL (GLOVE) ×4 IMPLANT
GLOVE EXAM NITRILE PF MED BLUE (GLOVE) ×4 IMPLANT
GLOVE SURG SS PI 8.0 STRL IVOR (GLOVE) ×4 IMPLANT
GOWN STRL REIN XL XLG (GOWN DISPOSABLE) ×4 IMPLANT
GOWN STRL REUS W/TWL LRG LVL3 (GOWN DISPOSABLE) ×4 IMPLANT
GUIDEWIRE ANG ZIPWIRE 038X150 (WIRE) IMPLANT
GUIDEWIRE STR DUAL SENSOR (WIRE) ×4 IMPLANT
KIT ROOM TURNOVER AP CYSTO (KITS) ×4 IMPLANT
MANIFOLD NEPTUNE II (INSTRUMENTS) ×4 IMPLANT
MANIFOLD NEPTUNE WASTE (CANNULA) ×4 IMPLANT
PACK CYSTO (CUSTOM PROCEDURE TRAY) ×4 IMPLANT
PAD ARMBOARD 7.5X6 YLW CONV (MISCELLANEOUS) ×4 IMPLANT
PAD TELFA 3X4 1S STER (GAUZE/BANDAGES/DRESSINGS) ×4 IMPLANT
TOWEL OR 17X26 4PK STRL BLUE (TOWEL DISPOSABLE) ×8 IMPLANT
WATER STERILE IRR 3000ML UROMA (IV SOLUTION) ×4 IMPLANT

## 2016-01-16 NOTE — Brief Op Note (Signed)
01/16/2016  12:40 PM  PATIENT:  Andrew [name]  65 y.o. male  PRE-OPERATIVE DIAGNOSIS:  bladder lesion  POST-OPERATIVE DIAGNOSIS:  bladder lesion  PROCEDURE:  Procedure(s): CYSTOSCOPY WITH RETROGRADE PYELOGRAM (N/A) CYSTOSCOPY WITH BLADDER BIOPSY (N/A)  SURGEON:  Surgeon(s) and Role:    * Irine Seal, MD - Primary  PHYSICIAN ASSISTANT:   ASSISTANTS: none   ANESTHESIA:   general  EBL:  Total I/O In: 900 [I.V.:900] Out: 0   BLOOD ADMINISTERED:none  DRAINS: none   LOCAL MEDICATIONS USED:  NONE  SPECIMEN:  Source of Specimen:  Biopsies from right bladder neck, done and left lateral wall.  DISPOSITION OF SPECIMEN:  PATHOLOGY  COUNTS:  YES  TOURNIQUET:  * No tourniquets in log *  DICTATION: .Other Dictation: Dictation Number [id number]  PLAN OF CARE: Discharge to home after PACU  PATIENT DISPOSITION:  PACU - hemodynamically stable.   Delay start of Pharmacological VTE agent (>24hrs) due to surgical blood loss or risk of bleeding: yes

## 2016-01-16 NOTE — Transfer of Care (Signed)
Immediate Anesthesia Transfer of Care Note  Patient: Andrew [name]  Procedure(s) Performed: Procedure(s): CYSTOSCOPY WITH RETROGRADE PYELOGRAM (N/A) CYSTOSCOPY WITH BLADDER BIOPSY (N/A) CYSTOSCOPY WITH FULGERATION (N/A)  Patient Location: PACU  Anesthesia Type:General  Level of Consciousness: sedated and patient cooperative  Airway & Oxygen Therapy: Patient Spontanous Breathing and non-rebreather face mask  Post-op Assessment: Report given to RN, Post -op Vital signs reviewed and stable and Patient moving all extremities  Post vital signs: Reviewed and stable  Last Vitals:  Vitals:   01/16/16 1015 01/16/16 1100  BP:  137/83  Resp:  19  Temp: 36.6 C     Last Pain:  Vitals:   01/16/16 1015  PainSc: 8       Patients Stated Pain Goal: 7 ([id number])  Complications: No apparent anesthesia complications

## 2016-01-16 NOTE — Anesthesia Procedure Notes (Signed)
Procedure Name: LMA Insertion Date/Time: 01/16/2016 12:08 PM Performed by: Vista Deck Pre-anesthesia Checklist: Patient identified, Patient being monitored, Emergency Drugs available, Timeout performed and Suction available Patient Re-evaluated:Patient Re-evaluated prior to inductionOxygen Delivery Method: Circle System Utilized Preoxygenation: Pre-oxygenation with 100% oxygen Intubation Type: IV induction Ventilation: Mask ventilation without difficulty LMA: LMA inserted LMA Size: 5.0 Number of attempts: 1 Placement Confirmation: positive ETCO2 and breath sounds checked- equal and bilateral Tube secured with: Tape

## 2016-01-16 NOTE — Anesthesia Postprocedure Evaluation (Signed)
Anesthesia Post Note  Patient: Andrew [name]  Procedure(s) Performed: Procedure(s) (LRB): CYSTOSCOPY WITH RETROGRADE PYELOGRAM (Bilateral) CYSTOSCOPY WITH BLADDER BIOPSY (N/A) CYSTOSCOPY WITH FULGERATION (N/A)  Patient location during evaluation: PACU Anesthesia Type: General Level of consciousness: awake and alert and oriented Pain management: pain level controlled Vital Signs Assessment: post-procedure vital signs reviewed and stable Respiratory status: spontaneous breathing, nonlabored ventilation and respiratory function stable Cardiovascular status: blood pressure returned to baseline and stable Postop Assessment: no signs of nausea or vomiting Anesthetic complications: no    Last Vitals:  Vitals:   01/16/16 1315 01/16/16 1330  BP: 126/76 (!) 148/96  Pulse: 77 76  Resp: 15 14  Temp:      Last Pain:  Vitals:   01/16/16 1330  PainSc: 0-No pain                 [name]

## 2016-01-16 NOTE — H&P (Signed)
CC: I have blood in my urine.  HPI: Andrew [name] is a 65 year-old male established patient who is here for blood in the urine.  He did not see the blood in his urine. He has not seen blood clots.   He does not have a burning sensation when he urinates. He is currently having trouble urinating.   He is not having pain. He has not recently had unwanted weight loss.     ALLERGIES: No Allergies    MEDICATIONS: Glimepiride TABS Oral  Invokana TABS Oral  MetFORMIN HCl TABS Oral  Omeprazole TBEC Oral  Propranolol HCl ER CP24 Oral  Simvastatin TABS Oral     GU PSH: No GU PSH      PSH Notes: Rotator Cuff Repair, Leg Repair, Arthroscopy Knee Right, Appendectomy, Bladder Surgery   NON-GU PSH: Appendectomy - 08/08/2015 Knee Arthroscopy; Dx - 08/08/2015    GU PMH: Personal history of malignant neoplasm of bladder, History of malignant neoplasm of bladder - 09/12/2015, History of bladder cancer, - 08/08/2015 Urethral Cancer, Malignant neoplasm of bladder neck - 09/12/2015    NON-GU PMH: Personal history of other diseases of the circulatory system, History of hypertension - 08/08/2015 Personal history of other diseases of the nervous system and sense organs, History of sleep apnea - 08/08/2015 Personal history of other endocrine, nutritional and metabolic disease, History of diabetes mellitus - 08/08/2015, History of hypercholesterolemia, - 08/08/2015 Personal history of transient ischemic attack (TIA), and cerebral infarction without residual deficits, History of stroke - 08/08/2015 Encounter for general adult medical examination without abnormal findings, Encounter for preventive health examination    FAMILY HISTORY: Diabetes - Runs In Family   SOCIAL HISTORY: No Social History     Notes: Alcohol use, Retired, Tobacco use, Caffeine use, Divorced   REVIEW OF SYSTEMS:    GU Review Male:   Patient reports hard to postpone urination, get up at night to urinate, and leakage of urine. Patient  denies frequent urination, burning/ pain with urination, stream starts and stops, trouble starting your stream, have to strain to urinate , erection problems, and penile pain.  Gastrointestinal (Upper):   Patient denies nausea, vomiting, and indigestion/ heartburn.  Gastrointestinal (Lower):   Patient denies diarrhea and constipation.  Constitutional:   Patient reports fatigue. Patient denies fever, night sweats, and weight loss.  Skin:   Patient denies skin rash/ lesion and itching.  Eyes:   Patient denies blurred vision and double vision.  Ears/ Nose/ Throat:   Patient denies sinus problems and sore throat.  Hematologic/Lymphatic:   Patient denies swollen glands and easy bruising.  Cardiovascular:   Patient denies leg swelling and chest pains.  Respiratory:   Patient denies cough and shortness of breath.  Endocrine:   Patient denies excessive thirst.  Musculoskeletal:   Patient denies back pain and joint pain.  Neurological:   Patient denies headaches and dizziness.  Psychologic:   Patient denies depression and anxiety.   VITAL SIGNS:      12/12/2015 09:39 AM  Weight 220 lb / 99.79 kg  Height 76 in / 193.04 cm  BP 113/66 mmHg  Pulse 87 /min  Temperature 98.2 F / 37 C  BMI 26.8 kg/m   MULTI-SYSTEM PHYSICAL EXAMINATION:    Constitutional: Well-nourished. No physical deformities. Normally developed. Good grooming.  Neck: Neck symmetrical, not swollen. Normal tracheal position.  Respiratory: No labored breathing, no use of accessory muscles.   Cardiovascular: Normal temperature, normal extremity pulses, no swelling, no varicosities.  Lymphatic: No  enlargement of neck, axillae, groin.  Skin: No paleness, no jaundice, no cyanosis. No lesion, no ulcer, no rash.  Neurologic / Psychiatric: Oriented to time, oriented to place, oriented to person. No depression, no anxiety, no agitation.  Gastrointestinal: No mass, no tenderness, no rigidity, non obese abdomen.  Musculoskeletal: Normal gait  and station of head and neck.     PAST DATA REVIEWED:  Source Of History:  Patient   PROCEDURES:         Flexible Cystoscopy - 52000  Risks, benefits, and some of the potential complications of the procedure were discussed at length with the patient including infection, bleeding, voiding discomfort, urinary retention, fever, chills, sepsis, and others. All questions were answered. Informed consent was obtained. Antibiotic prophylaxis was given. Sterile technique and intraurethral analgesia were used.  Meatus:  Normal size. Normal location. Normal condition.  Urethra:  No strictures.  External Sphincter:  Normal.  Verumontanum:  Normal.  Prostate:  Non-obstructing. No hyperplasia. 3cm with lateral lobe hyperplasia  Bladder Neck:  Non-obstructing.  Ureteral Orifices:  Normal location. Normal size. Normal shape. Effluxed clear urine.  Bladder:  No trabeculation. 2cm erythematous lesion at the dome with satellite lesions surround primary. 0.5cm erythematous lesion on left lateral wall. Normal mucosa. No stones.      The lower urinary tract was carefully examined. The procedure was well-tolerated and without complications. Antibiotic instructions were given. Instructions were given to call the office immediately for bloody urine, difficulty urinating, urinary retention, painful or frequent urination, fever, chills, nausea, vomiting or other illness. The patient stated that he understood these instructions and would comply with them.         Urinalysis - 81003 Dipstick Dipstick Cont'd  Specimen: Voided Bilirubin: 1+  Color: Yellow Ketones: Neg  Appearance: Clear Blood: Neg  Specific Gravity: 1.010 Protein: Neg  pH: 5.0 Urobilinogen: 0.2  Glucose: 3+ Nitrites: Neg    Leukocyte Esterase: Neg    ASSESSMENT:      ICD-10 Details  1 GU:   Other microscopic hematuria - R31.29    PLAN:           Document Letter(s):  Created for Patient: Clinical Summary         Notes:   Schedule for  bladder biopsy

## 2016-01-17 NOTE — Op Note (Signed)
Andrew [name], Andrew [name] NO.:  [id number]  MEDICAL RECORD NO.:  [id number]  LOCATION:  [facility]                          FACILITY:  [facility]  PHYSICIAN:  Marshall Cork. Jeffie Pollock, M.D.    DATE OF BIRTH:  02-28-1951  DATE OF PROCEDURE:  01/16/2016 DATE OF DISCHARGE:  01/16/2016                              OPERATIVE REPORT   PROCEDURE: 1. Cystoscopy with bilateral retrograde pyelograms and interpretation. 2. Cystoscopy with bladder biopsy and fulguration of 0.7 cm, 0.5 cm,     and 2 cm lesions.  PREOPERATIVE DIAGNOSIS:  History of bladder cancer with erythematous lesions on the dome and left lateral wall.  POSTOPERATIVE DIAGNOSIS:  History of bladder cancer with erythematous lesions on the dome and left lateral wall with the addition of a small papillary tumor on the right bladder neck.  Retrogrades are normal.  SURGEON:  Marshall Cork. Jeffie Pollock, M.D.  ANESTHESIA:  General.  SPECIMEN:  Biopsies from the papillary lesion at the right bladder neck and the lesion at the dome and the left lateral wall.  BLOOD LOSS:  Minimal.  DRAINS:  None.  COMPLICATIONS:  None.  INDICATIONS:  Andrew [name] is a 65 year old white male, who had resection of a bladder tumor in the spring and on followup cystoscopy was found to have erythematous lesions on the dome and lateral wall of the bladder that were felt to require biopsy.  FINDINGS OF PROCEDURE:  He was taken to the operating room, where a general anesthetic was induced.  He was given Ancef.  He was placed in lithotomy position and fitted with PAS hose.  His perineum and genitalia were prepped with Betadine solution.  He was draped in usual sterile fashion.  Cystoscopy was performed using a 23-French scope and a 30-degree lens. Examination revealed a normal urethra.  The external sphincter was intact.  The prostatic urethra was short with bilobar hyperplasia without significant obstruction.  Examination of bladder wall revealed mild  trabeculation.  Ureteral orifices in the normal anatomic position.  Inspection of bladder wall demonstrated a stellate scar on the left lateral wall with some adjacent erythema consistent possibly with carcinoma in situ versus just a neovascularity from healing. Additionally on the dome of the bladder was another stellate scar with some central erythema and once again similarly suspicious for CIS versus neovascularity.  There was additionally approximately a 7 mm papillary lesion identified at the right bladder neck that had not been noted on the initial inspection.  After thorough diagnostic cystoscopy, bilateral retrograde pyelograms were performed with a 5-French open-end catheter and contrast.  Right retrograde pyelogram revealed a normal ureter and intrarenal collecting system.  Left retrograde pyelogram revealed a normal ureter and intrarenal collecting system.  After completion of retrograde, cup biopsy forceps was used to biopsy the lesion on the right bladder neck.  A single cup biopsy largely eliminated the lesion.  He then underwent biopsy of the 5 mm lesion on the dome and 2 biopsies were obtained from the 2 cm area on the left lateral wall.  Once the biopsies were obtained, the biopsy sites were generously fulgurated with the dimensions of larger side approximately 2 to 2.5 cm on the  left lateral wall.  At this point, the bladder was drained and refilled.  Inspection revealed no active bleeding.  Bladder was drained again and the cystoscope was removed.  The patient was taken down from lithotomy position.  His anesthetic was reversed.  He was moved to recovery room in stable condition.  There were no complications.     Marshall Cork. Jeffie Pollock, M.D.     JJW/MEDQ  D:  01/16/2016  T:  01/17/2016  Job:  [id number]

## 2016-01-21 ENCOUNTER — Encounter (HOSPITAL_COMMUNITY): Payer: Self-pay | Admitting: Urology

## 2016-01-27 ENCOUNTER — Telehealth: Payer: Self-pay | Admitting: *Deleted

## 2016-01-27 NOTE — Telephone Encounter (Signed)
Left message for patient to call back to reschedule appt for 01/28/16 due to Dr. Evette Doffing being out

## 2016-01-28 ENCOUNTER — Ambulatory Visit (INDEPENDENT_AMBULATORY_CARE_PROVIDER_SITE_OTHER): Payer: Medicare Other

## 2016-01-28 ENCOUNTER — Encounter: Payer: Self-pay | Admitting: Family Medicine

## 2016-01-28 ENCOUNTER — Ambulatory Visit (INDEPENDENT_AMBULATORY_CARE_PROVIDER_SITE_OTHER): Payer: Medicare Other | Admitting: Family Medicine

## 2016-01-28 ENCOUNTER — Ambulatory Visit: Payer: Medicare Other | Admitting: Pediatrics

## 2016-01-28 ENCOUNTER — Other Ambulatory Visit: Payer: Self-pay | Admitting: Family Medicine

## 2016-01-28 VITALS — BP 130/78 | HR 103 | Temp 97.0°F | Ht 75.0 in | Wt 223.4 lb

## 2016-01-28 DIAGNOSIS — M545 Low back pain, unspecified: Secondary | ICD-10-CM

## 2016-01-28 MED ORDER — DICLOFENAC SODIUM 75 MG PO TBEC: 75.0000 mg | DELAYED_RELEASE_TABLET | Freq: Two times a day (BID) | ORAL | 2 refills | Status: DC

## 2016-01-28 MED ORDER — METHOCARBAMOL 500 MG PO TABS: 500.0000 mg | ORAL_TABLET | Freq: Four times a day (QID) | ORAL | 1 refills | Status: DC

## 2016-01-28 MED ORDER — KETOROLAC TROMETHAMINE 60 MG/2ML IM SOLN
60.0000 mg | Freq: Once | INTRAMUSCULAR | Status: AC
  Administered 2016-01-28: 60 mg via INTRAMUSCULAR

## 2016-01-28 NOTE — Progress Notes (Signed)
Subjective:  Patient ID: Andrew [name], male    DOB: [birth year]  Age: 65 y.o. MRN: [id number]  CC: Back Pain (3 weeks)   HPI Andrew [name] presents for Increasing low back pain. He notes the pain to be primarily on the left side for the last 2-3 weeks but it's been moving more towards the midline for the last 3-4 days. He describes the pain as severe. There is no positioning can get at that helps. He has to move around every 10 minutes or so. He is trying to sleep in a recliner that seems to help a little bit as compared to the bed. He is unable to perform anything more than the most basic ADLs such as dressing himself. He denies dysuria and hematuria. He has a long history of back pain. He moved here from [place] last year. He says he spent most of his life in [place]. 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 However over the last 6 months he's noted off-and-on increases. He does not have any known injury other than the cumulative effect of heavy labor over the years. At one point several years ago she was supposed to have a spinal fusion but that was declined by insurance.   History Andrew [name] has a past medical history of Anemia; Arthritis; Cancer (Andrew [name]); Depression; Diabetes mellitus without complication ([place]); GERD (gastroesophageal reflux disease); Hypercholesteremia; Hyperlipidemia; Neuromuscular disorder ([place]); Status post tendon repair (1989); Stroke [facility]); and Tremors of nervous system (2011).   He has a past surgical history that includes Bladder surgery; Appendectomy; Knee arthroscopy (Right, 1989); Rotator cuff repair (Bilateral, 2000); Cystoscopy w/ retrogrades (Bilateral, 08/22/2015); Cystoscopy with biopsy (N/A, 08/22/2015); Cystoscopy w/ retrogrades (Bilateral, 01/16/2016); Cystoscopy with biopsy (N/A, 01/16/2016); and Cystoscopy with fulgeration (N/A, 01/16/2016).   His family history is not on file.He reports that he has been smoking  Cigarettes.  He has a 42.00 pack-year smoking history. He has never used smokeless tobacco. He reports that he does not drink alcohol or use drugs.    ROS Review of Systems  Constitutional: Negative for chills, diaphoresis, fever and unexpected weight change.  HENT: Negative for congestion, hearing loss, rhinorrhea and sore throat.   Eyes: Negative for visual disturbance.  Respiratory: Negative for cough and shortness of breath.   Cardiovascular: Negative for chest pain.  Gastrointestinal: Negative for abdominal pain, constipation and diarrhea.  Genitourinary: Negative for dysuria and flank pain.  Musculoskeletal: Positive for arthralgias, back pain and myalgias. Negative for joint swelling.  Skin: Negative for rash.  Neurological: Negative for dizziness and headaches.  Psychiatric/Behavioral: Negative for dysphoric mood and sleep disturbance.    Objective:  BP 130/78   Pulse (!) 103   Temp 97 F (36.1 C) (Oral)   Ht 6\' 3"  (1.905 m)   Wt 223 lb 6.4 oz (101.3 kg)   BMI 27.92 kg/m   BP Readings from Last 3 Encounters:  01/28/16 130/78  01/16/16 139/80  01/15/16 127/78    Wt Readings from Last 3 Encounters:  01/28/16 223 lb 6.4 oz (101.3 kg)  01/15/16 230 lb (104.3 kg)  01/13/16 229 lb (103.9 kg)     Physical Exam  Constitutional: He is oriented to person, place, and time. He appears well-developed and well-nourished. No distress.  HENT:  Head: Normocephalic and atraumatic.  Right Ear: External ear normal.  Left Ear: External ear normal.  Nose: Nose normal.  Mouth/Throat: Oropharynx is clear and moist.  Eyes: Conjunctivae and  EOM are normal. Pupils are equal, round, and reactive to light.  Neck: Normal range of motion. Neck supple. No thyromegaly present.  Cardiovascular: Normal rate, regular rhythm and normal heart sounds.   No murmur heard. Pulmonary/Chest: Effort normal and breath sounds normal. No respiratory distress. He has no wheezes. He has no rales.    Abdominal: Soft. Bowel sounds are normal. He exhibits no distension. There is no tenderness.  Musculoskeletal: He exhibits tenderness (Midline lower back and paraspinous region on left).  Neg. SLR. NV intact BLE   Lymphadenopathy:    He has no cervical adenopathy.  Neurological: He is alert and oriented to person, place, and time. He has normal reflexes.  Skin: Skin is warm and dry.  Psychiatric: He has a normal mood and affect. His behavior is normal. Judgment and thought content normal.     Lab Results  Component Value Date   WBC 9.5 01/13/2016   HGB 17.6 (H) 01/13/2016   HCT 50.9 01/13/2016   PLT 185 01/13/2016   GLUCOSE 150 (H) 01/15/2016   CHOL 161 12/19/2015   TRIG 246 (H) 12/19/2015   HDL 42 12/19/2015   LDLCALC 70 12/19/2015   ALT 18 01/15/2016   AST 12 01/15/2016   NA 138 01/15/2016   K 4.6 01/15/2016   CL 98 01/15/2016   CREATININE 0.60 (L) 01/15/2016   BUN 16 01/15/2016   CO2 19 01/15/2016   HGBA1C 7.8 06/13/2015    No results found.  Assessment & Plan:   Chayanne was seen today for back pain.  Diagnoses and all orders for this visit:  Left-sided low back pain without sciatica -     ketorolac (TORADOL) injection 60 mg; Inject 2 mLs (60 mg total) into the muscle once. -     Ambulatory referral to Physical Therapy  Other orders -     diclofenac (VOLTAREN) 75 MG EC tablet; Take 1 tablet (75 mg total) by mouth 2 (two) times daily. For muscle and  Joint pain -     methocarbamol (ROBAXIN) 500 MG tablet; Take 1 tablet (500 mg total) by mouth 4 (four) times daily.      I have discontinued Mr. Suppa Dulaglutide and cyclobenzaprine. I am also having him start on diclofenac and methocarbamol. Additionally, I am having him maintain his aspirin, naproxen sodium, primidone, glimepiride, omeprazole, pravastatin, buPROPion, furosemide, metFORMIN, and HYDROcodone-acetaminophen. We administered ketorolac.  Meds ordered this encounter  Medications  . ketorolac  (TORADOL) injection 60 mg  . diclofenac (VOLTAREN) 75 MG EC tablet    Sig: Take 1 tablet (75 mg total) by mouth 2 (two) times daily. For muscle and  Joint pain    Dispense:  60 tablet    Refill:  2  . methocarbamol (ROBAXIN) 500 MG tablet    Sig: Take 1 tablet (500 mg total) by mouth 4 (four) times daily.    Dispense:  120 tablet    Refill:  1     Follow-up: Return in about 6 weeks (around 03/10/2016).  Claretta Fraise, M.D.

## 2016-01-30 ENCOUNTER — Ambulatory Visit (INDEPENDENT_AMBULATORY_CARE_PROVIDER_SITE_OTHER): Payer: Medicare Other | Admitting: Urology

## 2016-01-30 DIAGNOSIS — C673 Malignant neoplasm of anterior wall of bladder: Secondary | ICD-10-CM

## 2016-02-05 ENCOUNTER — Ambulatory Visit: Payer: Medicare Other | Attending: Family Medicine | Admitting: Physical Therapy

## 2016-02-05 ENCOUNTER — Telehealth: Payer: Self-pay | Admitting: *Deleted

## 2016-02-05 DIAGNOSIS — M545 Low back pain, unspecified: Secondary | ICD-10-CM

## 2016-02-05 DIAGNOSIS — R293 Abnormal posture: Secondary | ICD-10-CM | POA: Insufficient documentation

## 2016-02-05 NOTE — Telephone Encounter (Signed)
Left message for patient to call back regarding diabetic medication.

## 2016-02-05 NOTE — Telephone Encounter (Signed)
Patient came into office stating that he was switched from invokana to trulicity and was then told to stop taking trulicity due to swelling in legs.  Patient states that he no longer has swelling in his legs and has had his bladder surgery.  Patient would like to start back on invokana or something else beside trulicity.

## 2016-02-05 NOTE — Therapy (Signed)
[facility] [place], [place], [address] Phone: [phone]   Fax:  [phone]  Physical Therapy Evaluation  Patient Details  Name: Andrew [name] MRN: [id number] Date of Birth: [birth year] Referring Provider: Claretta Fraise MD  Encounter Date: 02/05/2016      PT End of Session - 02/05/16 1520    Visit Number 1   Number of Visits 16   Date for PT Re-Evaluation 04/05/16   PT Start Time 0235   PT Stop Time 0330   PT Time Calculation (min) 55 min   Activity Tolerance Patient tolerated treatment well   Behavior During Therapy [facility] for tasks assessed/performed      Past Medical History:  Diagnosis Date  . Anemia   . Arthritis   . Cancer [facility])    Bladder  . Depression   . Diabetes mellitus without complication ([place])   . GERD (gastroesophageal reflux disease)   . Hypercholesteremia   . Hyperlipidemia   . Neuromuscular disorder ([place])   . Status post tendon repair 1989  . Stroke ([place])    At times pt has dizziness with loss of vision  . Tremors of nervous system 2011    Past Surgical History:  Procedure Laterality Date  . APPENDECTOMY    . BLADDER SURGERY    . CYSTOSCOPY W/ RETROGRADES Bilateral 08/22/2015   Procedure: CYSTOSCOPY WITH RETROGRADE PYELOGRAM;  Surgeon: Irine Seal, MD;  Location: [facility];  Service: Urology;  Laterality: Bilateral;  . CYSTOSCOPY W/ RETROGRADES Bilateral 01/16/2016   Procedure: CYSTOSCOPY WITH RETROGRADE PYELOGRAM;  Surgeon: Irine Seal, MD;  Location: [facility];  Service: Urology;  Laterality: Bilateral;  . CYSTOSCOPY WITH BIOPSY N/A 08/22/2015   Procedure: CYSTOSCOPY WITH BLADDER BIOPSY;  Surgeon: Irine Seal, MD;  Location: [facility];  Service: Urology;  Laterality: N/A;  . CYSTOSCOPY WITH BIOPSY N/A 01/16/2016   Procedure: CYSTOSCOPY WITH BLADDER BIOPSY;  Surgeon: Irine Seal, MD;  Location: [facility];  Service: Urology;  Laterality: N/A;  . CYSTOSCOPY WITH FULGERATION N/A 01/16/2016   Procedure:  CYSTOSCOPY WITH FULGERATION;  Surgeon: Irine Seal, MD;  Location: [facility];  Service: Urology;  Laterality: N/A;  . KNEE ARTHROSCOPY Right 1989  . ROTATOR CUFF REPAIR Bilateral 2000    There were no vitals filed for this visit.       Subjective Assessment - 02/05/16 1504    Subjective The patient reports a long h/o low back pain over many years with some job related injuries.  The patient reports 4 weeks ago he woke up with low back pain.  A week later her underwent a surgery related to bladder cancer and once the pain medications wore off he was in intense pain.  He reports left sided low back pain and severe muscle spasms at times.  He does further report, however that his pain is improving.  His pain-level today is a 5-6/10.  His pain increases with movement, and prolonged sitting and standing.  His sleep is alos impaired due to pain.   Limitations Sitting;Standing   How long can you sit comfortably? 15 minutes.   How long can you stand comfortably? 15 minutes.   Patient Stated Goals Get out of pain and avoid surgery.   Pain Score 6    Pain Location Back   Pain Orientation Left;Lower   Pain Descriptors / Indicators Aching   Pain Type Acute pain            [facility] PT Assessment - 02/05/16 0001  Assessment   Medical Diagnosis left low back pain.   Referring Provider Claretta Fraise MD   Onset Date/Surgical Date --  4 weeks.     Precautions   Precaution Comments No ultrasound due to bladder CA.     Restrictions   Weight Bearing Restrictions No     Balance Screen   Has the patient fallen in the past 6 months No   Has the patient had a decrease in activity level because of a fear of falling?  No   Is the patient reluctant to leave their home because of a fear of falling?  No     Home Environment   Living Environment Private residence     Prior Function   Level of Independence Independent     Posture/Postural Control   Posture/Postural Control Postural limitations    Postural Limitations Rounded Shoulders;Forward head;Decreased lumbar lordosis     ROM / Strength   AROM / PROM / Strength AROM;Strength     AROM   Overall AROM Comments Active lumbar flexion is full and active lumbar extension is limited to 9 degrees.     Strength   Overall Strength Comments Normal bil LE strength.     Palpation   Palpation comment Tender to palpation over left lower lumbar musculature including the patient's QL.     Special Tests    Special Tests --  Absent bil ACh DTR's;(-)SLR testing;(=) leg lengths.     Ambulation/Gait   Gait Comments The patient walks in spinal flexion in obvious pain.                   [facility] Adult PT Treatment/Exercise - 02/05/16 0001      Modalities   Modalities Electrical Stimulation;Moist Heat     Moist Heat Therapy   Number Minutes Moist Heat 20 Minutes   Moist Heat Location Lumbar Spine     Electrical Stimulation   Electrical Stimulation Location Left low back.   Electrical Stimulation Action Constant pre-mod at 80-150 Hz x 20 minutes.   Electrical Stimulation Goals Pain                  PT Short Term Goals - 02/05/16 1518      PT SHORT TERM GOAL #1   Title STG's=LTG's.           PT Long Term Goals - 02/05/16 1519      PT LONG TERM GOAL #1   Title Independent with a HEP.   Time 8   Period Weeks   Status New     PT LONG TERM GOAL #2   Title Sit 30 minutes with pain not > 3/10.   Time 8   Period Weeks   Status New     PT LONG TERM GOAL #3   Title Sleep undisturbed 6 hours.   Time 8   Period Weeks     PT LONG TERM GOAL #4   Title No c/o low back muscle spams.   Time 8   Period Weeks   Status New               Plan - 02/05/16 1509    Clinical Impression Statement The patient presents to palpable pain over his left low back musculature including his left QL.  He has limitations of active lumbar ROM.  Achilles reflexes bilaterally are absent.  He demonstrates negative SLR  testing.   Rehab Potential Good   PT Frequency 2x / week  PT Duration 8 weeks   PT Treatment/Interventions ADLs/Self Care Home Management;Cryotherapy;Electrical Stimulation;Moist Heat;Patient/family education;Therapeutic exercise;Therapeutic activities;Manual techniques   PT Next Visit Plan NO ULTRASOUND.  Standing active lumbar extension.  Electrical stimulation and STW/M to left low back region.   Consulted and Agree with Plan of Care Patient      Patient will benefit from skilled therapeutic intervention in order to improve the following deficits and impairments:  Pain, Decreased activity tolerance, Decreased range of motion  Visit Diagnosis: Left-sided low back pain without sciatica - Plan: PT plan of care cert/re-cert  Abnormal posture - Plan: PT plan of care cert/re-cert      G-Codes - [id number]    Functional Assessment Tool Used FOTO.Marland KitchenMarland KitchenMarland Kitchen58% limitation.   Functional Limitation Mobility: Walking and moving around   Mobility: Walking and Moving Around Current Status [phone]) At least 40 percent but less than 60 percent impaired, limited or restricted   Mobility: Walking and Moving Around Goal Status [phone]) At least 20 percent but less than 40 percent impaired, limited or restricted       Problem List Patient Active Problem List   Diagnosis Date Noted  . Back spasm 01/15/2016  . Generalized edema 01/15/2016  . Tobacco use disorder 12/19/2015  . Diabetes ([place]) 12/19/2015  . Hyperlipidemia 12/19/2015  . Bladder cancer ([place]) 12/19/2015  . Esophageal reflux 12/19/2015  . Essential tremor 12/19/2015  . History of stroke 12/19/2015    Daqwan Dougal, [place] MPT 02/05/2016, 3:56 PM  [facility] [address] [place], [place], [address] Phone: [phone]   Fax:  [phone]  Name: Andrew [name] MRN: [id number] Date of Birth: Mar 05, 1951

## 2016-02-09 ENCOUNTER — Other Ambulatory Visit: Payer: Self-pay | Admitting: *Deleted

## 2016-02-09 ENCOUNTER — Ambulatory Visit: Payer: Medicare Other | Admitting: Physical Therapy

## 2016-02-09 DIAGNOSIS — R293 Abnormal posture: Secondary | ICD-10-CM | POA: Diagnosis not present

## 2016-02-09 DIAGNOSIS — M545 Low back pain, unspecified: Secondary | ICD-10-CM

## 2016-02-09 MED ORDER — CANAGLIFLOZIN 300 MG PO TABS: 300.0000 mg | ORAL_TABLET | Freq: Every day | ORAL | 3 refills | Status: DC

## 2016-02-09 NOTE — Telephone Encounter (Signed)
Per Dr. Evette Doffing patient may stop taking trulicity and restart invokana if ok with urologist.  Patient verbalized understanding and medication sent to pharmacy

## 2016-02-09 NOTE — Therapy (Signed)
[facility] [place], [place], [address] Phone: [phone]   Fax:  [phone]  Physical Therapy Treatment  Patient Details  Name: Andrew [name] MRN: [id number] Date of Birth: 1950-07-22 Referring Provider: Claretta Fraise MD  Encounter Date: 02/09/2016      PT End of Session - 02/09/16 1120    Visit Number 2   Number of Visits 16   Date for PT Re-Evaluation 04/05/16   PT Start Time 1118   PT Stop Time 1212   PT Time Calculation (min) 54 min   Activity Tolerance Patient tolerated treatment well   Behavior During Therapy [facility] for tasks assessed/performed      Past Medical History:  Diagnosis Date  . Anemia   . Arthritis   . Cancer [facility])    Bladder  . Depression   . Diabetes mellitus without complication ([place])   . GERD (gastroesophageal reflux disease)   . Hypercholesteremia   . Hyperlipidemia   . Neuromuscular disorder ([place])   . Status post tendon repair 1989  . Stroke ([place])    At times pt has dizziness with loss of vision  . Tremors of nervous system 2011    Past Surgical History:  Procedure Laterality Date  . APPENDECTOMY    . BLADDER SURGERY    . CYSTOSCOPY W/ RETROGRADES Bilateral 08/22/2015   Procedure: CYSTOSCOPY WITH RETROGRADE PYELOGRAM;  Surgeon: Irine Seal, MD;  Location: [facility];  Service: Urology;  Laterality: Bilateral;  . CYSTOSCOPY W/ RETROGRADES Bilateral 01/16/2016   Procedure: CYSTOSCOPY WITH RETROGRADE PYELOGRAM;  Surgeon: Irine Seal, MD;  Location: [facility];  Service: Urology;  Laterality: Bilateral;  . CYSTOSCOPY WITH BIOPSY N/A 08/22/2015   Procedure: CYSTOSCOPY WITH BLADDER BIOPSY;  Surgeon: Irine Seal, MD;  Location: [facility];  Service: Urology;  Laterality: N/A;  . CYSTOSCOPY WITH BIOPSY N/A 01/16/2016   Procedure: CYSTOSCOPY WITH BLADDER BIOPSY;  Surgeon: Irine Seal, MD;  Location: [facility];  Service: Urology;  Laterality: N/A;  . CYSTOSCOPY WITH FULGERATION N/A 01/16/2016   Procedure:  CYSTOSCOPY WITH FULGERATION;  Surgeon: Irine Seal, MD;  Location: [facility];  Service: Urology;  Laterality: N/A;  . KNEE ARTHROSCOPY Right 1989  . ROTATOR CUFF REPAIR Bilateral 2000    There were no vitals filed for this visit.      Subjective Assessment - 02/09/16 1121    Subjective Patient reports that he hasn't had any muscle spasms in back for the past three days. He has been up on his feet a lot over the past three days as well. Improved sleep for two nights but then last night could not sleep, but not due to back pain.   Limitations Sitting;Standing   How long can you sit comfortably? 15 minutes.   How long can you stand comfortably? 15 minutes.   Patient Stated Goals Get out of pain and avoid surgery.   Currently in Pain? Yes   Pain Score 2    Pain Location Back   Pain Orientation Left  to middle of back   Pain Descriptors / Indicators Aching   Pain Type Acute pain   Pain Onset 1 to 4 weeks ago   Pain Frequency Intermittent   Aggravating Factors  unsure   Effect of Pain on Daily Activities unable to be as active as usual                         [facility] Adult PT Treatment/Exercise - 02/09/16 0001  Exercises   Exercises Lumbar     Lumbar Exercises: Stretches   Active Hamstring Stretch 2 reps;30 seconds   Active Hamstring Stretch Limitations supine and seated    Single Knee to Chest Stretch 2 reps;30 seconds   Lower Trunk Rotation 5 reps;10 seconds     Lumbar Exercises: Aerobic   Stationary Bike Nustep L5 x      Lumbar Exercises: Standing   Other Standing Lumbar Exercises standing extension with cues to engage gluteals and keep knees straight     Lumbar Exercises: Supine   Ab Set 5 reps;5 seconds     Modalities   Modalities Electrical Stimulation;Moist Heat     Moist Heat Therapy   Number Minutes Moist Heat 15 Minutes   Moist Heat Location Lumbar Spine     Electrical Stimulation   Electrical Stimulation Location Low back; premod 80-150 Hz  to tolerance x 15 min   Electrical Stimulation Goals Pain                PT Education - 02/09/16 1204    Education provided Yes   Education Details HEP   Person(s) Educated Patient   Methods Explanation;Demonstration;Handout   Comprehension Verbalized understanding;Returned demonstration          PT Short Term Goals - 02/05/16 1518      PT SHORT TERM GOAL #1   Title STG's=LTG's.           PT Long Term Goals - 02/05/16 1519      PT LONG TERM GOAL #1   Title Independent with a HEP.   Time 8   Period Weeks   Status New     PT LONG TERM GOAL #2   Title Sit 30 minutes with pain not > 3/10.   Time 8   Period Weeks   Status New     PT LONG TERM GOAL #3   Title Sleep undisturbed 6 hours.   Time 8   Period Weeks     PT LONG TERM GOAL #4   Title No c/o low back muscle spams.   Time 8   Period Weeks   Status New               Plan - 02/09/16 1205    Clinical Impression Statement Patient presented today with report of no spasms for past three days. He tolerated TE well without increased pain or spasm. No goals met as only second visit.   Rehab Potential Good   PT Frequency 2x / week   PT Duration 8 weeks   PT Treatment/Interventions ADLs/Self Care Home Management;Cryotherapy;Electrical Stimulation;Moist Heat;Patient/family education;Therapeutic exercise;Therapeutic activities;Manual techniques   PT Next Visit Plan NO ULTRASOUND. Electrical stimulation and STW/M to left low back region.  Progress core as tolerated.   Consulted and Agree with Plan of Care Patient      Patient will benefit from skilled therapeutic intervention in order to improve the following deficits and impairments:  Pain, Decreased activity tolerance, Decreased range of motion  Visit Diagnosis: Left-sided low back pain without sciatica     Problem List Patient Active Problem List   Diagnosis Date Noted  . Back spasm 01/15/2016  . Generalized edema 01/15/2016  . Tobacco  use disorder 12/19/2015  . Diabetes ([place]) 12/19/2015  . Hyperlipidemia 12/19/2015  . Bladder cancer ([place]) 12/19/2015  . Esophageal reflux 12/19/2015  . Essential tremor 12/19/2015  . History of stroke 12/19/2015    Madelyn Flavors PT 02/09/2016, 12:11 PM  [facility] [place], [place], [address] Phone: [phone]   Fax:  [phone]  Name: Andrew [name] MRN: [id number] Date of Birth: 18-Oct-1950

## 2016-02-09 NOTE — Patient Instructions (Signed)
  Abdominal bracing   Flatten back by tightening stomach muscles and buttocks. Hold 5-10 seconds. Don't engage your oblique muscles. Repeat _10___ times per set. Do _1-3___ sets per session. Do __2__ sessions per day.   Knee-to-Chest Stretch: Unilateral    With hand behind right knee, pull knee in to chest until a comfortable stretch is felt in lower back and buttocks. Keep back relaxed. Hold _30___ seconds. Repeat _3___ times per set. Do ____ sets per session. Do __2__ sessions per day.     Lower Trunk Rotation Stretch   Keeping back flat and feet together, rotate knees to left side. Hold __10__ seconds. Repeat __5__ times per set. Do ____ sets per session. Do _2_ sessions per day.  Hamstring Stretch, Seated (you don't need to use strap)    Sit with one leg extended onto facing chair.  Hold for _30-60 seconds. Repeat 2-3____ times each leg. 2 times per day.  Madelyn Flavors, PT 02/09/16 12:04 PM [facility] [place], [place], [address] Phone: [phone]   Fax:  [phone]

## 2016-02-11 ENCOUNTER — Encounter: Payer: Self-pay | Admitting: Physical Therapy

## 2016-02-11 ENCOUNTER — Ambulatory Visit: Payer: Medicare Other | Admitting: Physical Therapy

## 2016-02-11 DIAGNOSIS — M545 Low back pain, unspecified: Secondary | ICD-10-CM

## 2016-02-11 DIAGNOSIS — R293 Abnormal posture: Secondary | ICD-10-CM | POA: Diagnosis not present

## 2016-02-11 NOTE — Therapy (Signed)
[facility] [place], [place], [address] Phone: [phone]   Fax:  [phone]  Physical Therapy Treatment  Patient Details  Name: Andrew [name] MRN: [id number] Date of Birth: 04/14/1951 Referring Provider: Claretta Fraise MD  Encounter Date: 02/11/2016      PT End of Session - 02/11/16 1118    Visit Number 3   Number of Visits 16   Date for PT Re-Evaluation 04/05/16   PT Start Time 1116   PT Stop Time 1201   PT Time Calculation (min) 45 min   Activity Tolerance Patient tolerated treatment well   Behavior During Therapy [facility] for tasks assessed/performed      Past Medical History:  Diagnosis Date  . Anemia   . Arthritis   . Cancer [facility])    Bladder  . Depression   . Diabetes mellitus without complication ([place])   . GERD (gastroesophageal reflux disease)   . Hypercholesteremia   . Hyperlipidemia   . Neuromuscular disorder ([place])   . Status post tendon repair 1989  . Stroke ([place])    At times pt has dizziness with loss of vision  . Tremors of nervous system 2011    Past Surgical History:  Procedure Laterality Date  . APPENDECTOMY    . BLADDER SURGERY    . CYSTOSCOPY W/ RETROGRADES Bilateral 08/22/2015   Procedure: CYSTOSCOPY WITH RETROGRADE PYELOGRAM;  Surgeon: Irine Seal, MD;  Location: [facility];  Service: Urology;  Laterality: Bilateral;  . CYSTOSCOPY W/ RETROGRADES Bilateral 01/16/2016   Procedure: CYSTOSCOPY WITH RETROGRADE PYELOGRAM;  Surgeon: Irine Seal, MD;  Location: [facility];  Service: Urology;  Laterality: Bilateral;  . CYSTOSCOPY WITH BIOPSY N/A 08/22/2015   Procedure: CYSTOSCOPY WITH BLADDER BIOPSY;  Surgeon: Irine Seal, MD;  Location: [facility];  Service: Urology;  Laterality: N/A;  . CYSTOSCOPY WITH BIOPSY N/A 01/16/2016   Procedure: CYSTOSCOPY WITH BLADDER BIOPSY;  Surgeon: Irine Seal, MD;  Location: [facility];  Service: Urology;  Laterality: N/A;  . CYSTOSCOPY WITH FULGERATION N/A 01/16/2016   Procedure:  CYSTOSCOPY WITH FULGERATION;  Surgeon: Irine Seal, MD;  Location: [facility];  Service: Urology;  Laterality: N/A;  . KNEE ARTHROSCOPY Right 1989  . ROTATOR CUFF REPAIR Bilateral 2000    There were no vitals filed for this visit.      Subjective Assessment - 02/11/16 1117    Subjective Reports that his back feels tight today. Denies any LE symptoms and reports that it is hard for him to get comfortable to sleep and has been sleeping in recliner mostly. Reports that he has to get up hay this afternoon and doesn't know if he will have help. Reports having muscle spasms now mostly in hands and feet now.   Limitations Sitting;Standing   How long can you sit comfortably? 15 minutes.   How long can you stand comfortably? 15 minutes.   Patient Stated Goals Get out of pain and avoid surgery.   Currently in Pain? Yes   Pain Score 2    Pain Location Back   Pain Orientation Lower   Pain Descriptors / Indicators Tightness   Pain Type Acute pain   Pain Onset 1 to 4 weeks ago            [facility] PT Assessment - 02/11/16 0001      Assessment   Medical Diagnosis left low back pain.   Next MD Visit None scheduled     Precautions   Precaution Comments No ultrasound due to bladder CA.  Restrictions   Weight Bearing Restrictions No                     [facility] Adult PT Treatment/Exercise - 02/11/16 0001      Lumbar Exercises: Stretches   Single Knee to Chest Stretch 3 reps;30 seconds   Single Knee to Chest Stretch Limitations BLE     Lumbar Exercises: Aerobic   Stationary Bike NuStep L5 x10 min     Lumbar Exercises: Supine   Ab Set 20 reps;5 seconds   Heel Slides 20 reps  LLE stopped due to increased pain   Heel Slides Limitations BLE   Bridge 5 reps     Modalities   Modalities Electrical Stimulation;Moist Heat     Moist Heat Therapy   Number Minutes Moist Heat 15 Minutes   Moist Heat Location Lumbar Spine     Electrical Stimulation   Electrical Stimulation Location  B superior lumbar paraspinals   Electrical Stimulation Action Pre-mod   Electrical Stimulation Parameters 80-150 hz x15 min   Electrical Stimulation Goals Pain                  PT Short Term Goals - 02/05/16 1518      PT SHORT TERM GOAL #1   Title STG's=LTG's.           PT Long Term Goals - 02/11/16 1146      PT LONG TERM GOAL #1   Title Independent with a HEP.   Time 8   Period Weeks   Status Achieved     PT LONG TERM GOAL #2   Title Sit 30 minutes with pain not > 3/10.   Time 8   Period Weeks   Status On-going     PT LONG TERM GOAL #3   Title Sleep undisturbed 6 hours.   Time 8   Period Weeks   Status On-going     PT LONG TERM GOAL #4   Title No c/o low back muscle spams.   Time 8   Period Weeks   Status On-going               Plan - 02/11/16 1148    Clinical Impression Statement Patient arrived with low back tightness today and patient report of HEP completion earlier today. Patient required VCs throughout core exercises to avoid valsalva maneuver and to complete core activation by pressing lumbar spine into plinth table. Patient experienced increased low back pain with heel slide with core activation today with LLE. Patient continued to experience low back pain with gentle bridges thus therapeutic exercise discontinued. Patient then reported that he doesn't want to get his low back too aggravated as he has to get up hay this afternoon. Normal stimulation and moist heat response to superior B lumbar paraspinals where patient indicated pain was present. Goals remain on-going secondary to continued sleep disturbance, pain with activities, and continued periodic muscle spasms. Patient experienced "a little tightness and a little pain" following end of session.   Rehab Potential Good   PT Frequency 2x / week   PT Duration 8 weeks   PT Treatment/Interventions ADLs/Self Care Home Management;Cryotherapy;Electrical Stimulation;Moist Heat;Patient/family  education;Therapeutic exercise;Therapeutic activities;Manual techniques   PT Next Visit Plan NO ULTRASOUND. Electrical stimulation and STW/M to left low back region.  Progress core as tolerated.   Consulted and Agree with Plan of Care Patient      Patient will benefit from skilled therapeutic intervention in order to improve the following  deficits and impairments:  Pain, Decreased activity tolerance, Decreased range of motion  Visit Diagnosis: Left-sided low back pain without sciatica  Abnormal posture     Problem List Patient Active Problem List   Diagnosis Date Noted  . Back spasm 01/15/2016  . Generalized edema 01/15/2016  . Tobacco use disorder 12/19/2015  . Diabetes ([place]) 12/19/2015  . Hyperlipidemia 12/19/2015  . Bladder cancer ([place]) 12/19/2015  . Esophageal reflux 12/19/2015  . Essential tremor 12/19/2015  . History of stroke 12/19/2015    Wynelle Fanny, PTA 02/11/2016, 12:08 PM  [facility] [address] [place], [place], [address] Phone: [phone]   Fax:  [phone]  Name: Andrew [name] MRN: [id number] Date of Birth: [birth year]

## 2016-02-16 ENCOUNTER — Ambulatory Visit (INDEPENDENT_AMBULATORY_CARE_PROVIDER_SITE_OTHER): Payer: Medicare Other | Admitting: Family Medicine

## 2016-02-16 ENCOUNTER — Ambulatory Visit: Payer: Medicare Other | Admitting: Physical Therapy

## 2016-02-16 ENCOUNTER — Encounter: Payer: Self-pay | Admitting: Physical Therapy

## 2016-02-16 ENCOUNTER — Encounter: Payer: Self-pay | Admitting: Family Medicine

## 2016-02-16 VITALS — BP 130/76 | HR 101 | Temp 98.1°F | Ht 75.0 in | Wt 221.0 lb

## 2016-02-16 DIAGNOSIS — M545 Low back pain, unspecified: Secondary | ICD-10-CM

## 2016-02-16 DIAGNOSIS — M62830 Muscle spasm of back: Secondary | ICD-10-CM

## 2016-02-16 DIAGNOSIS — R293 Abnormal posture: Secondary | ICD-10-CM

## 2016-02-16 MED ORDER — METHOCARBAMOL 500 MG PO TABS: 500.0000 mg | ORAL_TABLET | Freq: Four times a day (QID) | ORAL | 5 refills | Status: DC

## 2016-02-16 NOTE — Progress Notes (Signed)
Subjective:  Patient ID: Andrew [name], male    DOB: Nov 16, 1950  Age: 65 y.o. MRN: [id number]  CC: Back Pain (rck)   HPI [name] presents for The pain is much better. He is concerned about the grade 1 anterolisthesis. He is concerned that might need some surgery. At a setback at physical therapy 3 days ago. He says the therapist was fairly aggressive. For 2 days he could hardly stand up straight because of the pain that he was in. He says that was a much different pain than he has been experiencing lately. It was something he used to get back when he was in [employer]. After couple days that got better. Meanwhile his baseline pain from his previous visit is much better. He hasn't really felt anything about a week. He says the muscle relaxers help a whole lot. He had some cramping sensation in the feet that it relieved as well.   History Shedrick has a past medical history of Anemia; Arthritis; Cancer ([place]); Depression; Diabetes mellitus without complication ([place]); GERD (gastroesophageal reflux disease); Hypercholesteremia; Hyperlipidemia; Neuromuscular disorder ([place]); Status post tendon repair (1989); Stroke [facility]); and Tremors of nervous system (2011).   He has a past surgical history that includes Bladder surgery; Appendectomy; Knee arthroscopy (Right, 1989); Rotator cuff repair (Bilateral, 2000); Cystoscopy w/ retrogrades (Bilateral, 08/22/2015); Cystoscopy with biopsy (N/A, 08/22/2015); Cystoscopy w/ retrogrades (Bilateral, 01/16/2016); Cystoscopy with biopsy (N/A, 01/16/2016); and Cystoscopy with fulgeration (N/A, 01/16/2016).   His family history is not on file.He reports that he has been smoking Cigarettes.  He has a 42.00 pack-year smoking history. He has never used smokeless tobacco. He reports that he does not drink alcohol or use drugs.    ROS Review of Systems  Constitutional: Negative for chills, diaphoresis and fever.  HENT: Negative for sore throat.   Cardiovascular: Negative for  chest pain.  Gastrointestinal: Negative for abdominal pain.  Musculoskeletal: Positive for arthralgias, back pain and myalgias. Negative for gait problem and neck pain.  Skin: Negative for rash.  Neurological: Negative for weakness and numbness.    Objective:  BP 130/76   Pulse (!) 101   Temp 98.1 F (36.7 C) (Oral)   Ht 6\' 3"  (1.905 m)   Wt 221 lb (100.2 kg)   BMI 27.62 kg/m   BP Readings from Last 3 Encounters:  02/16/16 130/76  01/28/16 130/78  01/16/16 139/80    Wt Readings from Last 3 Encounters:  02/16/16 221 lb (100.2 kg)  01/28/16 223 lb 6.4 oz (101.3 kg)  01/15/16 230 lb (104.3 kg)     Physical Exam  Constitutional: He appears well-developed and well-nourished.  HENT:  Head: Normocephalic and atraumatic.  Right Ear: Tympanic membrane and external ear normal. No decreased hearing is noted.  Left Ear: Tympanic membrane and external ear normal. No decreased hearing is noted.  Mouth/Throat: No oropharyngeal exudate or posterior oropharyngeal erythema.  Eyes: Pupils are equal, round, and reactive to light.  Neck: Normal range of motion. Neck supple.  Cardiovascular: Normal rate and regular rhythm.   No murmur heard. Pulmonary/Chest: Breath sounds normal. No respiratory distress.  Abdominal: Soft. Bowel sounds are normal. He exhibits no mass. There is no tenderness.  Musculoskeletal: He exhibits tenderness (mild midline lower lumbar region. Full range of motion with negative straight leg raise bilaterally).  Vitals reviewed.    Lab Results  Component Value Date   WBC 9.5 01/13/2016   HGB 17.6 (H) 01/13/2016   HCT 50.9 01/13/2016   PLT 185 01/13/2016  GLUCOSE 150 (H) 01/15/2016   CHOL 161 12/19/2015   TRIG 246 (H) 12/19/2015   HDL 42 12/19/2015   LDLCALC 70 12/19/2015   ALT 18 01/15/2016   AST 12 01/15/2016   NA 138 01/15/2016   K 4.6 01/15/2016   CL 98 01/15/2016   CREATININE 0.60 (L) 01/15/2016   BUN 16 01/15/2016   CO2 19 01/15/2016   HGBA1C  7.8 06/13/2015    No results found.  Assessment & Plan:   Demarquis was seen today for back pain.  Diagnoses and all orders for this visit:  Left-sided low back pain without sciatica  Back spasm  Other orders -     methocarbamol (ROBAXIN) 500 MG tablet; Take 1 tablet (500 mg total) by mouth 4 (four) times daily.      I am having Mr. Soldan maintain his aspirin, naproxen sodium, primidone, glimepiride, omeprazole, pravastatin, buPROPion, furosemide, metFORMIN, HYDROcodone-acetaminophen, diclofenac, canagliflozin, and methocarbamol.  Meds ordered this encounter  Medications  . methocarbamol (ROBAXIN) 500 MG tablet    Sig: Take 1 tablet (500 mg total) by mouth 4 (four) times daily.    Dispense:  120 tablet    Refill:  5     Follow-up: Return if symptoms worsen or fail to improve.  Claretta Fraise, M.D.

## 2016-02-16 NOTE — Therapy (Signed)
[facility] [place], [place], [address] Phone: [phone]   Fax:  [phone]  Physical Therapy Treatment  Patient Details  Name: Andrew [name] MRN: [id number] Date of Birth: [birth year] Referring Provider: Claretta Fraise MD  Encounter Date: 02/16/2016      PT End of Session - 02/16/16 1122    Visit Number 4   Number of Visits 16   Date for PT Re-Evaluation 04/05/16   PT Start Time 1117   PT Stop Time 1202   PT Time Calculation (min) 45 min   Activity Tolerance Patient tolerated treatment well   Behavior During Therapy [facility] for tasks assessed/performed      Past Medical History:  Diagnosis Date  . Anemia   . Arthritis   . Cancer [facility])    Bladder  . Depression   . Diabetes mellitus without complication ([place])   . GERD (gastroesophageal reflux disease)   . Hypercholesteremia   . Hyperlipidemia   . Neuromuscular disorder ([place])   . Status post tendon repair 1989  . Stroke ([place])    At times pt has dizziness with loss of vision  . Tremors of nervous system 2011    Past Surgical History:  Procedure Laterality Date  . APPENDECTOMY    . BLADDER SURGERY    . CYSTOSCOPY W/ RETROGRADES Bilateral 08/22/2015   Procedure: CYSTOSCOPY WITH RETROGRADE PYELOGRAM;  Surgeon: Irine Seal, MD;  Location: [facility];  Service: Urology;  Laterality: Bilateral;  . CYSTOSCOPY W/ RETROGRADES Bilateral 01/16/2016   Procedure: CYSTOSCOPY WITH RETROGRADE PYELOGRAM;  Surgeon: Irine Seal, MD;  Location: [facility];  Service: Urology;  Laterality: Bilateral;  . CYSTOSCOPY WITH BIOPSY N/A 08/22/2015   Procedure: CYSTOSCOPY WITH BLADDER BIOPSY;  Surgeon: Irine Seal, MD;  Location: [facility];  Service: Urology;  Laterality: N/A;  . CYSTOSCOPY WITH BIOPSY N/A 01/16/2016   Procedure: CYSTOSCOPY WITH BLADDER BIOPSY;  Surgeon: Irine Seal, MD;  Location: [facility];  Service: Urology;  Laterality: N/A;  . CYSTOSCOPY WITH FULGERATION N/A 01/16/2016   Procedure:  CYSTOSCOPY WITH FULGERATION;  Surgeon: Irine Seal, MD;  Location: [facility];  Service: Urology;  Laterality: N/A;  . KNEE ARTHROSCOPY Right 1989  . ROTATOR CUFF REPAIR Bilateral 2000    There were no vitals filed for this visit.      Subjective Assessment - 02/16/16 1120    Subjective Reports that last treatment "awakened" pain he hadn't had in five years. Reports that yesterday him and his grandson went and walked through the woods for hunting spots. Reports that following previous treatment he could hardly stand up and into the next day as well.   Limitations Sitting;Standing   How long can you sit comfortably? 15 minutes.   How long can you stand comfortably? 15 minutes.   Patient Stated Goals Get out of pain and avoid surgery.   Currently in Pain? Yes   Pain Score 3    Pain Location Back   Pain Orientation Lower   Pain Type Acute pain   Pain Onset 1 to 4 weeks ago            [facility] PT Assessment - 02/16/16 0001      Assessment   Medical Diagnosis left low back pain.   Next MD Visit 02/16/2016     Precautions   Precaution Comments No ultrasound due to bladder CA.     Restrictions   Weight Bearing Restrictions No  [facility] Adult PT Treatment/Exercise - 02/16/16 0001      Lumbar Exercises: Aerobic   Stationary Bike NuStep L6 x17 min     Lumbar Exercises: Standing   Shoulder Extension Strengthening;Both   Shoulder Extension Limitations 3x10 reps Pink XTS   Other Standing Lumbar Exercises Seated reachouts with 2# x20 reps with core activation     Modalities   Modalities Electrical Stimulation;Moist Heat     Moist Heat Therapy   Number Minutes Moist Heat 15 Minutes   Moist Heat Location Lumbar Spine     Electrical Stimulation   Electrical Stimulation Location B lumbar paraspinals   Electrical Stimulation Action Pre-Mod   Electrical Stimulation Parameters 80-150 hz x15 min   Electrical Stimulation Goals Pain                   PT Short Term Goals - 02/05/16 1518      PT SHORT TERM GOAL #1   Title STG's=LTG's.           PT Long Term Goals - 02/16/16 1150      PT LONG TERM GOAL #1   Title Independent with a HEP.   Time 8   Period Weeks   Status Achieved     PT LONG TERM GOAL #2   Title Sit 30 minutes with pain not > 3/10.   Time 8   Period Weeks   Status On-going  Depends on positioning and changes positions 02/16/2016     PT LONG TERM GOAL #3   Title Sleep undisturbed 6 hours.   Time 8   Period Weeks   Status On-going  "hit and miss" regarding sleep with other co-morbidities; switches from recliner to bed per patient 02/16/2016     PT LONG TERM GOAL #4   Title No c/o low back muscle spams.   Time 8   Period Weeks   Status On-going  Still having muscle spasms in hands and feet per patient report; has prescription for muscle relaxers 02/16/2016               Plan - 02/16/16 1149    Clinical Impression Statement Patient arrived to treatment with continued low back pain at this time. Patient reported previous shoulder injury following Pink XTS standing core strengthening. Exercises limited to standing and sitting as to not exaggerate back pain. Patient reports that he is at his normal in regards to pain today in clinic. Patient continues to wake with pain per patient report. Goals remain on-going at this time secondary to pain, sleeping and muscle spasms. Normal modalities response noted following removal of the modalities. Patient experienced only shoulder fatigue following today's treatment.   Rehab Potential Good   PT Frequency 2x / week   PT Duration 8 weeks   PT Treatment/Interventions ADLs/Self Care Home Management;Cryotherapy;Electrical Stimulation;Moist Heat;Patient/family education;Therapeutic exercise;Therapeutic activities;Manual techniques   PT Next Visit Plan NO ULTRASOUND. Electrical stimulation and STW/M to left low back region.  Progress core as tolerated.   Consulted and  Agree with Plan of Care Patient      Patient will benefit from skilled therapeutic intervention in order to improve the following deficits and impairments:  Pain, Decreased activity tolerance, Decreased range of motion  Visit Diagnosis: Left-sided low back pain without sciatica  Abnormal posture     Problem List Patient Active Problem List   Diagnosis Date Noted  . Back spasm 01/15/2016  . Generalized edema 01/15/2016  . Tobacco use disorder 12/19/2015  . Diabetes ([place])  12/19/2015  . Hyperlipidemia 12/19/2015  . Bladder cancer ([place]) 12/19/2015  . Esophageal reflux 12/19/2015  . Essential tremor 12/19/2015  . History of stroke 12/19/2015    Ahmed Prima, PTA 02/16/16 12:26 PM Mali Applegate MPT [facility] [address] [place], [place], [address] Phone: [phone]   Fax:  [phone]  Name: Andrew [name] MRN: [id number] Date of Birth: 31-Jan-1951

## 2016-02-19 ENCOUNTER — Ambulatory Visit: Payer: Medicare Other | Admitting: Physical Therapy

## 2016-02-19 ENCOUNTER — Encounter: Payer: Self-pay | Admitting: Physical Therapy

## 2016-02-19 DIAGNOSIS — R293 Abnormal posture: Secondary | ICD-10-CM

## 2016-02-19 DIAGNOSIS — M545 Low back pain, unspecified: Secondary | ICD-10-CM

## 2016-02-19 NOTE — Therapy (Signed)
[facility] [place], [place], [address] Phone: [phone]   Fax:  [phone]  Physical Therapy Treatment  Patient Details  Name: Andrew [name] MRN: [id number] Date of Birth: 08-20-1950 Referring Provider: Claretta Fraise MD  Encounter Date: 02/19/2016      PT End of Session - 02/19/16 1311    Visit Number 5   Number of Visits 16   Date for PT Re-Evaluation 04/05/16   PT Start Time 1232   PT Stop Time 1328   PT Time Calculation (min) 56 min   Activity Tolerance Patient tolerated treatment well   Behavior During Therapy [facility] for tasks assessed/performed      Past Medical History:  Diagnosis Date  . Anemia   . Arthritis   . Cancer [facility])    Bladder  . Depression   . Diabetes mellitus without complication ([place])   . GERD (gastroesophageal reflux disease)   . Hypercholesteremia   . Hyperlipidemia   . Neuromuscular disorder ([place])   . Status post tendon repair 1989  . Stroke ([place])    At times pt has dizziness with loss of vision  . Tremors of nervous system 2011    Past Surgical History:  Procedure Laterality Date  . APPENDECTOMY    . BLADDER SURGERY    . CYSTOSCOPY W/ RETROGRADES Bilateral 08/22/2015   Procedure: CYSTOSCOPY WITH RETROGRADE PYELOGRAM;  Surgeon: Irine Seal, MD;  Location: [facility];  Service: Urology;  Laterality: Bilateral;  . CYSTOSCOPY W/ RETROGRADES Bilateral 01/16/2016   Procedure: CYSTOSCOPY WITH RETROGRADE PYELOGRAM;  Surgeon: Irine Seal, MD;  Location: [facility];  Service: Urology;  Laterality: Bilateral;  . CYSTOSCOPY WITH BIOPSY N/A 08/22/2015   Procedure: CYSTOSCOPY WITH BLADDER BIOPSY;  Surgeon: Irine Seal, MD;  Location: [facility];  Service: Urology;  Laterality: N/A;  . CYSTOSCOPY WITH BIOPSY N/A 01/16/2016   Procedure: CYSTOSCOPY WITH BLADDER BIOPSY;  Surgeon: Irine Seal, MD;  Location: [facility];  Service: Urology;  Laterality: N/A;  . CYSTOSCOPY WITH FULGERATION N/A 01/16/2016   Procedure:  CYSTOSCOPY WITH FULGERATION;  Surgeon: Irine Seal, MD;  Location: [facility];  Service: Urology;  Laterality: N/A;  . KNEE ARTHROSCOPY Right 1989  . ROTATOR CUFF REPAIR Bilateral 2000    There were no vitals filed for this visit.      Subjective Assessment - 02/19/16 1236    Subjective Patient reported he is a little stiff today yet feels therapy has helped thus far   Limitations Sitting;Standing   How long can you sit comfortably? 15 minutes.   How long can you stand comfortably? 15 minutes.   Patient Stated Goals Get out of pain and avoid surgery.   Currently in Pain? Yes   Pain Score 3    Pain Location Back   Pain Orientation Lower   Pain Descriptors / Indicators Tightness   Pain Type Acute pain   Pain Onset 1 to 4 weeks ago   Pain Frequency Intermittent   Aggravating Factors  layning in one position too long when sleeping or bending   Pain Relieving Factors at rest                         [facility] Adult PT Treatment/Exercise - 02/19/16 0001      Lumbar Exercises: Aerobic   Stationary Bike NuStep L6 x15 min     Lumbar Exercises: Standing   Other Standing Lumbar Exercises Seated reachouts D1/D2 with 2# x20 reps with core activation  Other Standing Lumbar Exercises pink XTS with core activation with scap retrations and ext 2x10 each     Lumbar Exercises: Supine   Bent Knee Raise 3 seconds  2x20   Straight Leg Raise 3 seconds  2x10     Moist Heat Therapy   Number Minutes Moist Heat 15 Minutes   Moist Heat Location Lumbar Spine     Electrical Stimulation   Electrical Stimulation Location B lumbar paraspinals   Electrical Stimulation Action premod   Electrical Stimulation Parameters 80-150hz  x70min   Electrical Stimulation Goals Pain                  PT Short Term Goals - 02/05/16 1518      PT SHORT TERM GOAL #1   Title STG's=LTG's.           PT Long Term Goals - 02/16/16 1150      PT LONG TERM GOAL #1   Title Independent with a  HEP.   Time 8   Period Weeks   Status Achieved     PT LONG TERM GOAL #2   Title Sit 30 minutes with pain not > 3/10.   Time 8   Period Weeks   Status On-going  Depends on positioning and changes positions 02/16/2016     PT LONG TERM GOAL #3   Title Sleep undisturbed 6 hours.   Time 8   Period Weeks   Status On-going  "hit and miss" regarding sleep with other co-morbidities; switches from recliner to bed per patient 02/16/2016     PT LONG TERM GOAL #4   Title No c/o low back muscle spams.   Time 8   Period Weeks   Status On-going  Still having muscle spasms in hands and feet per patient report; has prescription for muscle relaxers 02/16/2016               Plan - 02/19/16 1312    Clinical Impression Statement Patient progressing with some improvement overall and tolerated treatment wel. Patient has difficulty sleeping due to pain increase after prolong position. Educated patient on posture awareness techniques in all positions, patient has good understanding of posture awareness techniques. Current goals ongoing due to pain deficits.   Rehab Potential Good   PT Frequency 2x / week   PT Duration 8 weeks   PT Treatment/Interventions ADLs/Self Care Home Management;Cryotherapy;Electrical Stimulation;Moist Heat;Patient/family education;Therapeutic exercise;Therapeutic activities;Manual techniques   PT Next Visit Plan NO ULTRASOUND. Electrical stimulation and STW/M to left low back region.  Progress core as tolerated.      Patient will benefit from skilled therapeutic intervention in order to improve the following deficits and impairments:  Pain, Decreased activity tolerance, Decreased range of motion  Visit Diagnosis: Left-sided low back pain without sciatica  Abnormal posture     Problem List Patient Active Problem List   Diagnosis Date Noted  . Back spasm 01/15/2016  . Generalized edema 01/15/2016  . Tobacco use disorder 12/19/2015  . Diabetes ([place]) 12/19/2015  .  Hyperlipidemia 12/19/2015  . Bladder cancer ([place]) 12/19/2015  . Esophageal reflux 12/19/2015  . Essential tremor 12/19/2015  . History of stroke 12/19/2015    Phillips Climes, PTA 02/19/2016, 1:28 PM  [facility] [address] [place], [place], [address] Phone: [phone]   Fax:  [phone]  Name: Andrew [name] MRN: [id number] Date of Birth: 1950-12-18

## 2016-02-23 ENCOUNTER — Ambulatory Visit: Payer: Medicare Other | Attending: Family Medicine | Admitting: Physical Therapy

## 2016-02-23 ENCOUNTER — Encounter: Payer: Self-pay | Admitting: Physical Therapy

## 2016-02-23 DIAGNOSIS — G8929 Other chronic pain: Secondary | ICD-10-CM | POA: Diagnosis not present

## 2016-02-23 DIAGNOSIS — M545 Low back pain: Secondary | ICD-10-CM | POA: Diagnosis not present

## 2016-02-23 DIAGNOSIS — R293 Abnormal posture: Secondary | ICD-10-CM | POA: Diagnosis not present

## 2016-02-23 NOTE — Therapy (Signed)
[facility] [place], [place], [address] Phone: [phone]   Fax:  [phone]  Physical Therapy Treatment  Patient Details  Name: Andrew [name] MRN: [id number] Date of Birth: [birth year] Referring Provider: Claretta Fraise MD  Encounter Date: 02/23/2016      PT End of Session - 02/23/16 1514    Visit Number 6   Number of Visits 16   Date for PT Re-Evaluation 04/05/16   PT Start Time [id number]   PT Stop Time 1527   PT Time Calculation (min) 49 min   Activity Tolerance Patient tolerated treatment well   Behavior During Therapy [facility] for tasks assessed/performed      Past Medical History:  Diagnosis Date  . Anemia   . Arthritis   . Cancer [facility])    Bladder  . Depression   . Diabetes mellitus without complication ([place])   . GERD (gastroesophageal reflux disease)   . Hypercholesteremia   . Hyperlipidemia   . Neuromuscular disorder ([place])   . Status post tendon repair 1989  . Stroke ([place])    At times pt has dizziness with loss of vision  . Tremors of nervous system 2011    Past Surgical History:  Procedure Laterality Date  . APPENDECTOMY    . BLADDER SURGERY    . CYSTOSCOPY W/ RETROGRADES Bilateral 08/22/2015   Procedure: CYSTOSCOPY WITH RETROGRADE PYELOGRAM;  Surgeon: Irine Seal, MD;  Location: [facility];  Service: Urology;  Laterality: Bilateral;  . CYSTOSCOPY W/ RETROGRADES Bilateral 01/16/2016   Procedure: CYSTOSCOPY WITH RETROGRADE PYELOGRAM;  Surgeon: Irine Seal, MD;  Location: [facility];  Service: Urology;  Laterality: Bilateral;  . CYSTOSCOPY WITH BIOPSY N/A 08/22/2015   Procedure: CYSTOSCOPY WITH BLADDER BIOPSY;  Surgeon: Irine Seal, MD;  Location: [facility];  Service: Urology;  Laterality: N/A;  . CYSTOSCOPY WITH BIOPSY N/A 01/16/2016   Procedure: CYSTOSCOPY WITH BLADDER BIOPSY;  Surgeon: Irine Seal, MD;  Location: [facility];  Service: Urology;  Laterality: N/A;  . CYSTOSCOPY WITH FULGERATION N/A 01/16/2016   Procedure:  CYSTOSCOPY WITH FULGERATION;  Surgeon: Irine Seal, MD;  Location: [facility];  Service: Urology;  Laterality: N/A;  . KNEE ARTHROSCOPY Right 1989  . ROTATOR CUFF REPAIR Bilateral 2000    There were no vitals filed for this visit.      Subjective Assessment - 02/23/16 1438    Subjective Reports some low back stiffness today. Reports he was pruning some shrubbery yesterday that may have irritated his back. Compliant with HEP per patient report.   Limitations Sitting;Standing   How long can you sit comfortably? 15 minutes.   How long can you stand comfortably? 15 minutes.   Patient Stated Goals Get out of pain and avoid surgery.   Currently in Pain? Yes   Pain Score 3    Pain Location Back   Pain Orientation Lower   Pain Descriptors / Indicators Other (Comment)  Stiffness   Pain Type Acute pain   Pain Onset 1 to 4 weeks ago            [facility] PT Assessment - 02/23/16 0001      Assessment   Medical Diagnosis left low back pain.     Precautions   Precaution Comments No ultrasound due to bladder CA.     Restrictions   Weight Bearing Restrictions No                     [facility] Adult PT Treatment/Exercise - 02/23/16 0001  Lumbar Exercises: Aerobic   Stationary Bike NuStep L6 x15 min     Lumbar Exercises: Standing   Row Strengthening;Both   Row Limitations 3x10 reps Pink XTS   Other Standing Lumbar Exercises Seated 4# reachouts x20 reps, D1/D2 x10 reps each     Lumbar Exercises: Supine   Bent Knee Raise 20 reps   Straight Leg Raise 20 reps;3 seconds   Straight Leg Raises Limitations BLE     Modalities   Modalities Electrical Stimulation;Moist Heat     Moist Heat Therapy   Number Minutes Moist Heat 15 Minutes   Moist Heat Location Lumbar Spine     Electrical Stimulation   Electrical Stimulation Location B lumbar paraspinals   Electrical Stimulation Action Pre-Mod   Electrical Stimulation Parameters 80-150 hz x15 min   Electrical Stimulation Goals Pain                   PT Short Term Goals - 02/05/16 1518      PT SHORT TERM GOAL #1   Title STG's=LTG's.           PT Long Term Goals - 02/16/16 1150      PT LONG TERM GOAL #1   Title Independent with a HEP.   Time 8   Period Weeks   Status Achieved     PT LONG TERM GOAL #2   Title Sit 30 minutes with pain not > 3/10.   Time 8   Period Weeks   Status On-going  Depends on positioning and changes positions 02/16/2016     PT LONG TERM GOAL #3   Title Sleep undisturbed 6 hours.   Time 8   Period Weeks   Status On-going  "hit and miss" regarding sleep with other co-morbidities; switches from recliner to bed per patient 02/16/2016     PT LONG TERM GOAL #4   Title No c/o low back muscle spams.   Time 8   Period Weeks   Status On-going  Still having muscle spasms in hands and feet per patient report; has prescription for muscle relaxers 02/16/2016               Plan - 02/23/16 1516    Clinical Impression Statement Patient continues to tolerate treatment well with no reports of increased low back pain. Patient did experience shoulder discomfort with 4# D1/D2 and resisted row today from previous injury. Patient compliant with log rolling technique as evidenced in clinic today. Normal modalities response noted following removal of the modalities.   Rehab Potential Good   PT Frequency 2x / week   PT Duration 8 weeks   PT Treatment/Interventions ADLs/Self Care Home Management;Cryotherapy;Electrical Stimulation;Moist Heat;Patient/family education;Therapeutic exercise;Therapeutic activities;Manual techniques   PT Next Visit Plan NO ULTRASOUND. Electrical stimulation and STW/M to left low back region.  Progress core as tolerated.   Consulted and Agree with Plan of Care Patient      Patient will benefit from skilled therapeutic intervention in order to improve the following deficits and impairments:  Pain, Decreased activity tolerance, Decreased range of  motion  Visit Diagnosis: Abnormal posture     Problem List Patient Active Problem List   Diagnosis Date Noted  . Back spasm 01/15/2016  . Generalized edema 01/15/2016  . Tobacco use disorder 12/19/2015  . Diabetes ([place]) 12/19/2015  . Hyperlipidemia 12/19/2015  . Bladder cancer ([place]) 12/19/2015  . Esophageal reflux 12/19/2015  . Essential tremor 12/19/2015  . History of stroke 12/19/2015    Lum Babe  Ronne Binning, PTA 02/23/2016, 3:43 PM  [facility] [place], [place], [address] Phone: [phone]   Fax:  [phone]  Name: Zamarion Evert MRN: [id number] Date of Birth: Apr 08, 1951

## 2016-02-25 ENCOUNTER — Encounter: Payer: Self-pay | Admitting: Pediatrics

## 2016-02-25 ENCOUNTER — Telehealth: Payer: Self-pay | Admitting: *Deleted

## 2016-02-25 ENCOUNTER — Ambulatory Visit (INDEPENDENT_AMBULATORY_CARE_PROVIDER_SITE_OTHER): Payer: Medicare Other | Admitting: Pediatrics

## 2016-02-25 VITALS — BP 138/76 | HR 65 | Temp 98.2°F | Ht 75.0 in | Wt 222.4 lb

## 2016-02-25 DIAGNOSIS — G629 Polyneuropathy, unspecified: Secondary | ICD-10-CM | POA: Diagnosis not present

## 2016-02-25 DIAGNOSIS — F17200 Nicotine dependence, unspecified, uncomplicated: Secondary | ICD-10-CM | POA: Diagnosis not present

## 2016-02-25 DIAGNOSIS — Z23 Encounter for immunization: Secondary | ICD-10-CM | POA: Diagnosis not present

## 2016-02-25 DIAGNOSIS — L84 Corns and callosities: Secondary | ICD-10-CM | POA: Diagnosis not present

## 2016-02-25 DIAGNOSIS — M2042 Other hammer toe(s) (acquired), left foot: Secondary | ICD-10-CM | POA: Diagnosis not present

## 2016-02-25 DIAGNOSIS — M62830 Muscle spasm of back: Secondary | ICD-10-CM

## 2016-02-25 DIAGNOSIS — E1165 Type 2 diabetes mellitus with hyperglycemia: Secondary | ICD-10-CM

## 2016-02-25 NOTE — Progress Notes (Signed)
  Subjective:   Patient ID: Andrew [name], male    DOB: May 14, 1951, 65 y.o.   MRN: [id number] CC: Discuss Medications  HPI: Cantrell O[facility]ner is a 65 y.o. male presenting for Discuss Medications  DM2: Low BGL 165 Usually 160s-180s in the morning  Last few days having some chest pain Started having URI symptoms 3 days ago Hurts to take deep breaths sometimes Both R side of his chest and L side Some raspy throat Chest pain not worse with exertion Late night and early morning is when he notices it Respiratory trouble since getting g[place] in [place]  Still planning to quit smoking, not ready yet  Callus bottom of R foot Hammer toe L foot Foot pain at times Some loss of sensation in feet Difficult time finding well fitting shoes with his size of feet  Relevant past medical, surgical, family and social history reviewed. Allergies and medications reviewed and updated. History  Smoking Status  . Current Every Day Smoker  . Packs/day: 1.00  . Years: 42.00  . Types: Cigarettes  Smokeless Tobacco  . Never Used   ROS: Per HPI   Objective:    BP 138/76   Pulse 65   Temp 98.2 F (36.8 C) (Oral)   Ht 6\' 3"  (1.905 m)   Wt 222 lb 6.4 oz (100.9 kg)   BMI 27.80 kg/m   Wt Readings from Last 3 Encounters:  02/25/16 222 lb 6.4 oz (100.9 kg)  02/16/16 221 lb (100.2 kg)  01/28/16 223 lb 6.4 oz (101.3 kg)    Gen: NAD, alert, cooperative with exam, NCAT EYES: EOMI, no conjunctival injection, or no icterus CV: NRRR, normal S1/S2, no murmur, distal pulses 2+ b/l Resp: CTABL, no wheezes, normal WOB Ext: 1+ pitting edema L ankle Neuro: Alert and oriented, strength equal b/l UE and LE, coordination grossly normal MSK: normal muscle bulk Foot: 63mm thickened plaque R foot, L foot 2nd toe with hammer toe, no ulcers, DP pulses diminished b/l, PT pulse 2+ b/l  Assessment & Plan:  Kayse was seen today for discuss medications.  Diagnoses and all orders for this visit:  Type 2 diabetes mellitus  with hyperglycemia, without long-term current use of insulin (HCC) Gave samples and card for invokana Needs to get eye exam Gave Rx for diabetic shoes, with foot callus R, some dullness to sensation in forefeet b/l, hammer toe L foot. No ulcers now -     PR DIABETIC DELUXE SHOE  Tobacco use disorder Has Rx for wellbutrin, not yet ready to start it, but says he is planning to soon  Back spasm Much improved with muscle relxers  Foot callus -     PR DIABETIC DELUXE SHOE  Hammer toe of left foot -     PR DIABETIC DELUXE SHOE  Neuropathy ([place]) -     PR DIABETIC DELUXE SHOE  Encounter for immunization -     Flu vaccine HIGH DOSE PF   Follow up plan: Return in about 4 weeks (around 03/24/2016) for a1c f/u. Assunta Found, MD [place]

## 2016-02-25 NOTE — Telephone Encounter (Signed)
Patient aware that samples are ready for pick up and Rx for diabetic shoes faxed to [facility]

## 2016-02-26 ENCOUNTER — Ambulatory Visit: Payer: Medicare Other | Admitting: Physical Therapy

## 2016-02-26 ENCOUNTER — Encounter: Payer: Self-pay | Admitting: Physical Therapy

## 2016-02-26 DIAGNOSIS — G8929 Other chronic pain: Secondary | ICD-10-CM

## 2016-02-26 DIAGNOSIS — M545 Low back pain: Secondary | ICD-10-CM | POA: Diagnosis not present

## 2016-02-26 DIAGNOSIS — R293 Abnormal posture: Secondary | ICD-10-CM | POA: Diagnosis not present

## 2016-02-26 NOTE — Therapy (Signed)
[facility] [place], [place], [address] Phone: [phone]   Fax:  [phone]  Physical Therapy Treatment  Patient Details  Name: Andrew [name] MRN: [id number] Date of Birth: July 21, 1950 Referring Provider: Claretta Fraise MD  Encounter Date: 02/26/2016      PT End of Session - 02/26/16 1355    Visit Number 7   Number of Visits 16   Date for PT Re-Evaluation 04/05/16   PT Start Time 1344   PT Stop Time 1435   PT Time Calculation (min) 51 min   Activity Tolerance Patient tolerated treatment well   Behavior During Therapy [facility] for tasks assessed/performed      Past Medical History:  Diagnosis Date  . Anemia   . Arthritis   . Cancer [facility])    Bladder  . Depression   . Diabetes mellitus without complication ([place])   . GERD (gastroesophageal reflux disease)   . Hypercholesteremia   . Hyperlipidemia   . Neuromuscular disorder ([place])   . Status post tendon repair 1989  . Stroke ([place])    At times pt has dizziness with loss of vision  . Tremors of nervous system 2011    Past Surgical History:  Procedure Laterality Date  . APPENDECTOMY    . BLADDER SURGERY    . CYSTOSCOPY W/ RETROGRADES Bilateral 08/22/2015   Procedure: CYSTOSCOPY WITH RETROGRADE PYELOGRAM;  Surgeon: Irine Seal, MD;  Location: [facility];  Service: Urology;  Laterality: Bilateral;  . CYSTOSCOPY W/ RETROGRADES Bilateral 01/16/2016   Procedure: CYSTOSCOPY WITH RETROGRADE PYELOGRAM;  Surgeon: Irine Seal, MD;  Location: [facility];  Service: Urology;  Laterality: Bilateral;  . CYSTOSCOPY WITH BIOPSY N/A 08/22/2015   Procedure: CYSTOSCOPY WITH BLADDER BIOPSY;  Surgeon: Irine Seal, MD;  Location: [facility];  Service: Urology;  Laterality: N/A;  . CYSTOSCOPY WITH BIOPSY N/A 01/16/2016   Procedure: CYSTOSCOPY WITH BLADDER BIOPSY;  Surgeon: Irine Seal, MD;  Location: [facility];  Service: Urology;  Laterality: N/A;  . CYSTOSCOPY WITH FULGERATION N/A 01/16/2016   Procedure:  CYSTOSCOPY WITH FULGERATION;  Surgeon: Irine Seal, MD;  Location: [facility];  Service: Urology;  Laterality: N/A;  . KNEE ARTHROSCOPY Right 1989  . ROTATOR CUFF REPAIR Bilateral 2000    There were no vitals filed for this visit.      Subjective Assessment - 02/26/16 1355    Subjective Reports his back was tight this morning but loosened after completing exercises. Slept the whole night in the bed and states that that was the first night in a long time he has been able to do that.   Limitations Sitting;Standing   How long can you sit comfortably? 15 minutes.   How long can you stand comfortably? 15 minutes.   Patient Stated Goals Get out of pain and avoid surgery.   Currently in Pain? Yes   Pain Score 3    Pain Location Back   Pain Orientation Lower   Pain Descriptors / Indicators Other (Comment)  Stiffness   Pain Type Acute pain   Pain Onset 1 to 4 weeks ago            [facility] PT Assessment - 02/26/16 0001      Assessment   Medical Diagnosis left low back pain.     Precautions   Precaution Comments No ultrasound due to bladder CA.     Restrictions   Weight Bearing Restrictions No  [facility] Adult PT Treatment/Exercise - 02/26/16 0001      Lumbar Exercises: Aerobic   Stationary Bike NuStep L6 x15 min     Lumbar Exercises: Machines for Strengthening   Cybex Knee Extension 20# 2x10 reps    Cybex Knee Flexion 30# 3x10 reps     Lumbar Exercises: Standing   Other Standing Lumbar Exercises Seated 4# reachouts x20 reps   Other Standing Lumbar Exercises B chop wood Pink XTS x15 reps each     Modalities   Modalities Electrical Stimulation;Moist Heat     Moist Heat Therapy   Number Minutes Moist Heat 15 Minutes   Moist Heat Location Lumbar Spine     Electrical Stimulation   Electrical Stimulation Location B lumbar paraspinals   Electrical Stimulation Action Pre-Mod   Electrical Stimulation Parameters 80-150 hz x15 min   Electrical  Stimulation Goals Pain                  PT Short Term Goals - 02/05/16 1518      PT SHORT TERM GOAL #1   Title STG's=LTG's.           PT Long Term Goals - 02/16/16 1150      PT LONG TERM GOAL #1   Title Independent with a HEP.   Time 8   Period Weeks   Status Achieved     PT LONG TERM GOAL #2   Title Sit 30 minutes with pain not > 3/10.   Time 8   Period Weeks   Status On-going  Depends on positioning and changes positions 02/16/2016     PT LONG TERM GOAL #3   Title Sleep undisturbed 6 hours.   Time 8   Period Weeks   Status On-going  "hit and miss" regarding sleep with other co-morbidities; switches from recliner to bed per patient 02/16/2016     PT LONG TERM GOAL #4   Title No c/o low back muscle spams.   Time 8   Period Weeks   Status On-going  Still having muscle spasms in hands and feet per patient report; has prescription for muscle relaxers 02/16/2016               Plan - 02/26/16 1426    Clinical Impression Statement Patient continues to present with low level low back pain that presents as stiffness especially in the morning upon waking. LE and core strengthening were main focus today to continue improvement of low back. Patient had no complaints of LE and or shoulder pain with exercises although reps limited with shoulder exercises secondary to previous shoulder injury. Normal modalities response noted following removal of the modalities.    Rehab Potential Good   PT Frequency 2x / week   PT Duration 8 weeks   PT Treatment/Interventions ADLs/Self Care Home Management;Cryotherapy;Electrical Stimulation;Moist Heat;Patient/family education;Therapeutic exercise;Therapeutic activities;Manual techniques   PT Next Visit Plan NO ULTRASOUND. Electrical stimulation and STW/M to left low back region.  Progress core as tolerated.   Consulted and Agree with Plan of Care Patient      Patient will benefit from skilled therapeutic intervention in order  to improve the following deficits and impairments:  Pain, Decreased activity tolerance, Decreased range of motion  Visit Diagnosis: Abnormal posture  Chronic left-sided low back pain without sciatica     Problem List Patient Active Problem List   Diagnosis Date Noted  . Back spasm 01/15/2016  . Generalized edema 01/15/2016  . Tobacco use disorder 12/19/2015  . Diabetes (  [place]) 12/19/2015  . Hyperlipidemia 12/19/2015  . Bladder cancer ([place]) 12/19/2015  . Esophageal reflux 12/19/2015  . Essential tremor 12/19/2015  . History of stroke 12/19/2015    Wynelle Fanny, PTA 02/26/2016, 2:47 PM  [facility] [address] [place], [place], [address] Phone: [phone]   Fax:  [phone]  Name: Andrew [name] MRN: [id number] Date of Birth: 10-13-1950

## 2016-03-01 ENCOUNTER — Ambulatory Visit: Payer: Medicare Other | Admitting: Physical Therapy

## 2016-03-01 ENCOUNTER — Encounter: Payer: Self-pay | Admitting: Physical Therapy

## 2016-03-01 DIAGNOSIS — G8929 Other chronic pain: Secondary | ICD-10-CM

## 2016-03-01 DIAGNOSIS — M545 Low back pain, unspecified: Secondary | ICD-10-CM

## 2016-03-01 DIAGNOSIS — R293 Abnormal posture: Secondary | ICD-10-CM

## 2016-03-01 NOTE — Therapy (Signed)
[facility] [place], [place], [address] Phone: [phone]   Fax:  [phone]  Physical Therapy Treatment  Patient Details  Name: Andrew [name] MRN: [id number] Date of Birth: 1951/03/12 Referring Provider: Claretta Fraise MD  Encounter Date: 03/01/2016      PT End of Session - 03/01/16 1346    Visit Number 8   Number of Visits 16   Date for PT Re-Evaluation 04/05/16   PT Start Time 9381   PT Stop Time 1435   PT Time Calculation (min) 50 min   Activity Tolerance Patient tolerated treatment well   Behavior During Therapy [facility] for tasks assessed/performed      Past Medical History:  Diagnosis Date  . Anemia   . Arthritis   . Cancer [facility])    Bladder  . Depression   . Diabetes mellitus without complication ([place])   . GERD (gastroesophageal reflux disease)   . Hypercholesteremia   . Hyperlipidemia   . Neuromuscular disorder ([place])   . Status post tendon repair 1989  . Stroke ([place])    At times pt has dizziness with loss of vision  . Tremors of nervous system 2011    Past Surgical History:  Procedure Laterality Date  . APPENDECTOMY    . BLADDER SURGERY    . CYSTOSCOPY W/ RETROGRADES Bilateral 08/22/2015   Procedure: CYSTOSCOPY WITH RETROGRADE PYELOGRAM;  Surgeon: Irine Seal, MD;  Location: [facility];  Service: Urology;  Laterality: Bilateral;  . CYSTOSCOPY W/ RETROGRADES Bilateral 01/16/2016   Procedure: CYSTOSCOPY WITH RETROGRADE PYELOGRAM;  Surgeon: Irine Seal, MD;  Location: [facility];  Service: Urology;  Laterality: Bilateral;  . CYSTOSCOPY WITH BIOPSY N/A 08/22/2015   Procedure: CYSTOSCOPY WITH BLADDER BIOPSY;  Surgeon: Irine Seal, MD;  Location: [facility];  Service: Urology;  Laterality: N/A;  . CYSTOSCOPY WITH BIOPSY N/A 01/16/2016   Procedure: CYSTOSCOPY WITH BLADDER BIOPSY;  Surgeon: Irine Seal, MD;  Location: [facility];  Service: Urology;  Laterality: N/A;  . CYSTOSCOPY WITH FULGERATION N/A 01/16/2016   Procedure:  CYSTOSCOPY WITH FULGERATION;  Surgeon: Irine Seal, MD;  Location: [facility];  Service: Urology;  Laterality: N/A;  . KNEE ARTHROSCOPY Right 1989  . ROTATOR CUFF REPAIR Bilateral 2000    There were no vitals filed for this visit.      Subjective Assessment - 03/01/16 1355    Subjective Reports that his back feels "like usual." Reports that he hasn't done much today. Reports that he has had a lot of cramping in feet and legs and also in his hands over the past 3-4 days.   Limitations Sitting;Standing   How long can you sit comfortably? 15 minutes.   How long can you stand comfortably? 15 minutes.   Patient Stated Goals Get out of pain and avoid surgery.   Currently in Pain? Yes   Pain Score 3    Pain Location Back   Pain Orientation Lower   Pain Type Acute pain   Pain Onset 1 to 4 weeks ago            [facility] PT Assessment - 03/01/16 0001      Assessment   Medical Diagnosis left low back pain.     Precautions   Precaution Comments No ultrasound due to bladder CA.     Restrictions   Weight Bearing Restrictions No                     [facility] Adult PT Treatment/Exercise - 03/01/16  0001      Lumbar Exercises: Aerobic   Stationary Bike NuStep L5 x15 min     Lumbar Exercises: Machines for Strengthening   Cybex Knee Extension 20# 3x10 reps    Cybex Knee Flexion 40# 3x10 reps     Lumbar Exercises: Standing   Shoulder Extension Strengthening;Both;20 reps   Shoulder Extension Limitations pink XTS   Other Standing Lumbar Exercises Standing 4# reachouts x20 reps   Other Standing Lumbar Exercises B chop wood Pink XTS x15 reps each     Modalities   Modalities Electrical Stimulation;Moist Heat     Moist Heat Therapy   Number Minutes Moist Heat 15 Minutes   Moist Heat Location Lumbar Spine     Electrical Stimulation   Electrical Stimulation Location B lumbar paraspinals   Electrical Stimulation Action Pre-Mod   Electrical Stimulation Parameters 80-150 hz x15 min    Electrical Stimulation Goals Pain                  PT Short Term Goals - 02/05/16 1518      PT SHORT TERM GOAL #1   Title STG's=LTG's.           PT Long Term Goals - 03/01/16 1425      PT LONG TERM GOAL #1   Title Independent with a HEP.   Time 8   Period Weeks   Status Achieved     PT LONG TERM GOAL #2   Title Sit 30 minutes with pain not > 3/10.   Time 8   Period Weeks   Status Achieved  With feet propped up per patient report or leaning forward per patient report 03/01/2016     PT LONG TERM GOAL #3   Title Sleep undisturbed 6 hours.   Time 8   Period Weeks   Status Partially Met  Has been sleeping 5-6 hours in bed per patient report 03/01/2016     PT LONG TERM GOAL #4   Title No c/o low back muscle spams.   Time 8   Period Weeks   Status Achieved  None back spasms in several weeks (3.5 weeks) per patient report 03/01/2016               Plan - 03/01/16 1422    Clinical Impression Statement Patient continues to present in clinic with low level low back pain. Patient has not experienced pinched type pain that he had prior to MD and PT evaluation in several weeks per patient report. Patient able to tolerate all therapeutic exercises fairly well although he experienced shoulder fatigue per patient report. Patient has achieved or partially achieved all goals set at evaluation at this time.   Rehab Potential Good   PT Frequency 2x / week   PT Duration 8 weeks   PT Treatment/Interventions ADLs/Self Care Home Management;Cryotherapy;Electrical Stimulation;Moist Heat;Patient/family education;Therapeutic exercise;Therapeutic activities;Manual techniques   PT Next Visit Plan Continue with core strengthening exercises with modalities PRN per patient report.   Consulted and Agree with Plan of Care Patient      Patient will benefit from skilled therapeutic intervention in order to improve the following deficits and impairments:  Pain, Decreased activity  tolerance, Decreased range of motion  Visit Diagnosis: Abnormal posture  Chronic left-sided low back pain without sciatica     Problem List Patient Active Problem List   Diagnosis Date Noted  . Back spasm 01/15/2016  . Generalized edema 01/15/2016  . Tobacco use disorder 12/19/2015  . Diabetes ([place]) 12/19/2015  .  Hyperlipidemia 12/19/2015  . Bladder cancer ([place]) 12/19/2015  . Esophageal reflux 12/19/2015  . Essential tremor 12/19/2015  . History of stroke 12/19/2015    Wynelle Fanny, PTA 03/01/2016, 2:40 PM  [facility] [address] [place], [place], [address] Phone: [phone]   Fax:  [phone]  Name: Andrew [name] MRN: [id number] Date of Birth: 06-19-1950

## 2016-03-04 ENCOUNTER — Ambulatory Visit: Payer: Medicare Other | Admitting: Physical Therapy

## 2016-03-04 DIAGNOSIS — M545 Low back pain: Secondary | ICD-10-CM

## 2016-03-04 DIAGNOSIS — R293 Abnormal posture: Secondary | ICD-10-CM

## 2016-03-04 DIAGNOSIS — G8929 Other chronic pain: Secondary | ICD-10-CM

## 2016-03-04 NOTE — Therapy (Signed)
[facility] [place], [place], [address] Phone: [phone]   Fax:  [phone]  Physical Therapy Treatment  Patient Details  Name: Andrew [name] MRN: [id number] Date of Birth: 08-10-1950 Referring Provider: Claretta Fraise MD  Encounter Date: 03/04/2016      PT End of Session - 03/04/16 1411    Visit Number 9   Number of Visits 16   Date for PT Re-Evaluation 04/05/16   PT Start Time 0120   Activity Tolerance Patient tolerated treatment well   Behavior During Therapy [facility] for tasks assessed/performed      Past Medical History:  Diagnosis Date  . Anemia   . Arthritis   . Cancer [facility])    Bladder  . Depression   . Diabetes mellitus without complication ([place])   . GERD (gastroesophageal reflux disease)   . Hypercholesteremia   . Hyperlipidemia   . Neuromuscular disorder ([place])   . Status post tendon repair 1989  . Stroke ([place])    At times pt has dizziness with loss of vision  . Tremors of nervous system 2011    Past Surgical History:  Procedure Laterality Date  . APPENDECTOMY    . BLADDER SURGERY    . CYSTOSCOPY W/ RETROGRADES Bilateral 08/22/2015   Procedure: CYSTOSCOPY WITH RETROGRADE PYELOGRAM;  Surgeon: Irine Seal, MD;  Location: [facility];  Service: Urology;  Laterality: Bilateral;  . CYSTOSCOPY W/ RETROGRADES Bilateral 01/16/2016   Procedure: CYSTOSCOPY WITH RETROGRADE PYELOGRAM;  Surgeon: Irine Seal, MD;  Location: [facility];  Service: Urology;  Laterality: Bilateral;  . CYSTOSCOPY WITH BIOPSY N/A 08/22/2015   Procedure: CYSTOSCOPY WITH BLADDER BIOPSY;  Surgeon: Irine Seal, MD;  Location: [facility];  Service: Urology;  Laterality: N/A;  . CYSTOSCOPY WITH BIOPSY N/A 01/16/2016   Procedure: CYSTOSCOPY WITH BLADDER BIOPSY;  Surgeon: Irine Seal, MD;  Location: [facility];  Service: Urology;  Laterality: N/A;  . CYSTOSCOPY WITH FULGERATION N/A 01/16/2016   Procedure: CYSTOSCOPY WITH FULGERATION;  Surgeon: Irine Seal, MD;   Location: [facility];  Service: Urology;  Laterality: N/A;  . KNEE ARTHROSCOPY Right 1989  . ROTATOR CUFF REPAIR Bilateral 2000    There were no vitals filed for this visit.      Subjective Assessment - 03/04/16 1412    Subjective I had a bad flare-up last night and I can't even starighten up today.   Pain Score 9    Pain Location Back   Pain Orientation Lower   Pain Descriptors / Indicators --  "Hurting."                         [facility] Adult PT Treatment/Exercise - 03/04/16 0001      Modalities   Modalities Electrical Stimulation;Ultrasound     Moist Heat Therapy   Number Minutes Moist Heat 10 Minutes   Moist Heat Location Lumbar Spine     Electrical Stimulation   Electrical Stimulation Location Left lumbar   Electrical Stimulation Action Constant pre-mod   Electrical Stimulation Parameters 80-150 Hz.   Electrical Stimulation Goals Pain     Ultrasound   Ultrasound Location Left LB   Ultrasound Parameters 1.50 W/CM2 x 12 minutes.   Ultrasound Goals Pain     Manual Therapy   Manual therapy comments Right sdly position with pillows between knees for comfort:  STW/M x 12 minutes to patient's low back musculature.  PT Short Term Goals - 02/05/16 1518      PT SHORT TERM GOAL #1   Title STG's=LTG's.           PT Long Term Goals - 03/01/16 1425      PT LONG TERM GOAL #1   Title Independent with a HEP.   Time 8   Period Weeks   Status Achieved     PT LONG TERM GOAL #2   Title Sit 30 minutes with pain not > 3/10.   Time 8   Period Weeks   Status Achieved  With feet propped up per patient report or leaning forward per patient report 03/01/2016     PT LONG TERM GOAL #3   Title Sleep undisturbed 6 hours.   Time 8   Period Weeks   Status Partially Met  Has been sleeping 5-6 hours in bed per patient report 03/01/2016     PT LONG TERM GOAL #4   Title No c/o low back muscle spams.   Time 8   Period Weeks   Status  Achieved  None back spasms in several weeks (3.5 weeks) per patient report 03/01/2016             Patient will benefit from skilled therapeutic intervention in order to improve the following deficits and impairments:  Pain, Decreased activity tolerance, Decreased range of motion  Visit Diagnosis: Abnormal posture  Chronic left-sided low back pain without sciatica     Problem List Patient Active Problem List   Diagnosis Date Noted  . Back spasm 01/15/2016  . Generalized edema 01/15/2016  . Tobacco use disorder 12/19/2015  . Diabetes ([place]) 12/19/2015  . Hyperlipidemia 12/19/2015  . Bladder cancer ([place]) 12/19/2015  . Esophageal reflux 12/19/2015  . Essential tremor 12/19/2015  . History of stroke 12/19/2015    Abdoulie Tierce, [place] MPT 03/04/2016, 2:38 PM  [name] [address] [place], [place], [address] Phone: [phone]   Fax:  [phone]  Name: Andrew [name] MRN: [id number] Date of Birth: 1950/08/20

## 2016-03-08 ENCOUNTER — Ambulatory Visit: Payer: Medicare Other | Admitting: Physical Therapy

## 2016-03-08 ENCOUNTER — Encounter: Payer: Self-pay | Admitting: Physical Therapy

## 2016-03-08 DIAGNOSIS — M545 Low back pain, unspecified: Secondary | ICD-10-CM

## 2016-03-08 DIAGNOSIS — G8929 Other chronic pain: Secondary | ICD-10-CM

## 2016-03-08 DIAGNOSIS — R293 Abnormal posture: Secondary | ICD-10-CM | POA: Diagnosis not present

## 2016-03-08 NOTE — Therapy (Signed)
[facility] [place], [place], [address] Phone: [phone]   Fax:  [phone]  Physical Therapy Treatment  Patient Details  Name: Andrew [name] MRN: [id number] Date of Birth: 11-27-1950 Referring Provider: Claretta Fraise MD  Encounter Date: 03/08/2016      PT End of Session - 03/08/16 1425    Visit Number 10   Number of Visits 16   Date for PT Re-Evaluation 04/05/16   PT Start Time 1350   PT Stop Time 1436   PT Time Calculation (min) 46 min   Activity Tolerance Patient tolerated treatment well   Behavior During Therapy [facility] for tasks assessed/performed      Past Medical History:  Diagnosis Date  . Anemia   . Arthritis   . Cancer [facility])    Bladder  . Depression   . Diabetes mellitus without complication ([place])   . GERD (gastroesophageal reflux disease)   . Hypercholesteremia   . Hyperlipidemia   . Neuromuscular disorder ([place])   . Status post tendon repair 1989  . Stroke ([place])    At times pt has dizziness with loss of vision  . Tremors of nervous system 2011    Past Surgical History:  Procedure Laterality Date  . APPENDECTOMY    . BLADDER SURGERY    . CYSTOSCOPY W/ RETROGRADES Bilateral 08/22/2015   Procedure: CYSTOSCOPY WITH RETROGRADE PYELOGRAM;  Surgeon: Irine Seal, MD;  Location: [facility];  Service: Urology;  Laterality: Bilateral;  . CYSTOSCOPY W/ RETROGRADES Bilateral 01/16/2016   Procedure: CYSTOSCOPY WITH RETROGRADE PYELOGRAM;  Surgeon: Irine Seal, MD;  Location: [facility];  Service: Urology;  Laterality: Bilateral;  . CYSTOSCOPY WITH BIOPSY N/A 08/22/2015   Procedure: CYSTOSCOPY WITH BLADDER BIOPSY;  Surgeon: Irine Seal, MD;  Location: [facility];  Service: Urology;  Laterality: N/A;  . CYSTOSCOPY WITH BIOPSY N/A 01/16/2016   Procedure: CYSTOSCOPY WITH BLADDER BIOPSY;  Surgeon: Irine Seal, MD;  Location: [facility];  Service: Urology;  Laterality: N/A;  . CYSTOSCOPY WITH FULGERATION N/A 01/16/2016   Procedure:  CYSTOSCOPY WITH FULGERATION;  Surgeon: Irine Seal, MD;  Location: [facility];  Service: Urology;  Laterality: N/A;  . KNEE ARTHROSCOPY Right 1989  . ROTATOR CUFF REPAIR Bilateral 2000    There were no vitals filed for this visit.      Subjective Assessment - 03/08/16 1422    Subjective Reports that he has had a hard time standing upright. Reports that he was feeling better following previous treatment but pain returned later.   Limitations Sitting;Standing   How long can you sit comfortably? 15 minutes.   How long can you stand comfortably? 15 minutes.   Patient Stated Goals Get out of pain and avoid surgery.   Currently in Pain? Yes   Pain Score 6    Pain Location Back   Pain Orientation Left;Lower   Pain Descriptors / Indicators Tightness   Pain Type Acute pain   Pain Onset 1 to 4 weeks ago            [facility] PT Assessment - 03/08/16 0001      Assessment   Medical Diagnosis left low back pain.     Precautions   Precaution Comments No ultrasound due to bladder CA.     Restrictions   Weight Bearing Restrictions No                     [facility] Adult PT Treatment/Exercise - 03/08/16 0001      Modalities  Modalities Electrical Stimulation;Moist Heat     Moist Heat Therapy   Number Minutes Moist Heat 15 Minutes   Moist Heat Location Lumbar Spine     Electrical Stimulation   Electrical Stimulation Location L low back   Electrical Stimulation Action Pre-Mod   Electrical Stimulation Parameters 80-150 hz x15 min   Electrical Stimulation Goals Pain     Manual Therapy   Manual Therapy Soft tissue mobilization   Soft tissue mobilization STW/MFR to L lumbar paraspinals/ QL to decrease tightness and pain in R sidelying                   PT Short Term Goals - 02/05/16 1518      PT SHORT TERM GOAL #1   Title STG's=LTG's.           PT Long Term Goals - 03/01/16 1425      PT LONG TERM GOAL #1   Title Independent with a HEP.   Time 8   Period  Weeks   Status Achieved     PT LONG TERM GOAL #2   Title Sit 30 minutes with pain not > 3/10.   Time 8   Period Weeks   Status Achieved  With feet propped up per patient report or leaning forward per patient report 03/01/2016     PT LONG TERM GOAL #3   Title Sleep undisturbed 6 hours.   Time 8   Period Weeks   Status Partially Met  Has been sleeping 5-6 hours in bed per patient report 03/01/2016     PT LONG TERM GOAL #4   Title No c/o low back muscle spams.   Time 8   Period Weeks   Status Achieved  None back spasms in several weeks (3.5 weeks) per patient report 03/01/2016               Plan - 03/08/16 1434    Clinical Impression Statement Patient continues to present in clinic with increased pain that limits erect stance. Patient presented with increased tightness of L lumbar paraspinals and QL region. Patient was initially sensitive to palpation to L upper QL region as well as lumbar paraspinals. Sensitiivty to manual therapy to L lumbar paraspinals diminished but QL sensitivity remained intermittant. Normal modalities response noted following removal of the modalities.   Rehab Potential Good   PT Frequency 2x / week   PT Duration 8 weeks   PT Treatment/Interventions ADLs/Self Care Home Management;Cryotherapy;Electrical Stimulation;Moist Heat;Patient/family education;Therapeutic exercise;Therapeutic activities;Manual techniques   PT Next Visit Plan Continue with core strengthening exercises with modalities PRN per patient report.   Consulted and Agree with Plan of Care Patient      Patient will benefit from skilled therapeutic intervention in order to improve the following deficits and impairments:  Pain, Decreased activity tolerance, Decreased range of motion  Visit Diagnosis: Abnormal posture  Chronic left-sided low back pain without sciatica     Problem List Patient Active Problem List   Diagnosis Date Noted  . Back spasm 01/15/2016  . Generalized edema  01/15/2016  . Tobacco use disorder 12/19/2015  . Diabetes ([place]) 12/19/2015  . Hyperlipidemia 12/19/2015  . Bladder cancer ([place]) 12/19/2015  . Esophageal reflux 12/19/2015  . Essential tremor 12/19/2015  . History of stroke 12/19/2015    Ahmed Prima, PTA 03/08/16 3:37 PM  [facility] [address] [place], [place], [address] Phone: [phone]   Fax:  [phone]  Name: Andrew [name] MRN: [id number] Date of  Birth: 05-Sep-1950

## 2016-03-11 ENCOUNTER — Ambulatory Visit: Payer: Medicare Other | Admitting: Physical Therapy

## 2016-03-11 ENCOUNTER — Encounter: Payer: Self-pay | Admitting: Physical Therapy

## 2016-03-11 DIAGNOSIS — M545 Low back pain, unspecified: Secondary | ICD-10-CM

## 2016-03-11 DIAGNOSIS — R293 Abnormal posture: Secondary | ICD-10-CM | POA: Diagnosis not present

## 2016-03-11 DIAGNOSIS — G8929 Other chronic pain: Secondary | ICD-10-CM | POA: Diagnosis not present

## 2016-03-11 NOTE — Therapy (Signed)
[facility] [place], [place], [address] Phone: [phone]   Fax:  [phone]  Physical Therapy Treatment  Patient Details  Name: Andrew [name] MRN: [id number] Date of Birth: Dec 13, 1950 Referring Provider: Claretta Fraise MD  Encounter Date: 03/11/2016      PT End of Session - 03/11/16 1436    Visit Number 11   Number of Visits 16   Date for PT Re-Evaluation 04/05/16   PT Start Time 1400   PT Stop Time 1444   PT Time Calculation (min) 44 min   Activity Tolerance Patient tolerated treatment well   Behavior During Therapy [facility] for tasks assessed/performed      Past Medical History:  Diagnosis Date  . Anemia   . Arthritis   . Cancer [facility])    Bladder  . Depression   . Diabetes mellitus without complication ([place])   . GERD (gastroesophageal reflux disease)   . Hypercholesteremia   . Hyperlipidemia   . Neuromuscular disorder ([place])   . Status post tendon repair 1989  . Stroke ([place])    At times pt has dizziness with loss of vision  . Tremors of nervous system 2011    Past Surgical History:  Procedure Laterality Date  . APPENDECTOMY    . BLADDER SURGERY    . CYSTOSCOPY W/ RETROGRADES Bilateral 08/22/2015   Procedure: CYSTOSCOPY WITH RETROGRADE PYELOGRAM;  Surgeon: Irine Seal, MD;  Location: [facility];  Service: Urology;  Laterality: Bilateral;  . CYSTOSCOPY W/ RETROGRADES Bilateral 01/16/2016   Procedure: CYSTOSCOPY WITH RETROGRADE PYELOGRAM;  Surgeon: Irine Seal, MD;  Location: [facility];  Service: Urology;  Laterality: Bilateral;  . CYSTOSCOPY WITH BIOPSY N/A 08/22/2015   Procedure: CYSTOSCOPY WITH BLADDER BIOPSY;  Surgeon: Irine Seal, MD;  Location: [facility];  Service: Urology;  Laterality: N/A;  . CYSTOSCOPY WITH BIOPSY N/A 01/16/2016   Procedure: CYSTOSCOPY WITH BLADDER BIOPSY;  Surgeon: Irine Seal, MD;  Location: [facility];  Service: Urology;  Laterality: N/A;  . CYSTOSCOPY WITH FULGERATION N/A 01/16/2016   Procedure:  CYSTOSCOPY WITH FULGERATION;  Surgeon: Irine Seal, MD;  Location: [facility];  Service: Urology;  Laterality: N/A;  . KNEE ARTHROSCOPY Right 1989  . ROTATOR CUFF REPAIR Bilateral 2000    There were no vitals filed for this visit.      Subjective Assessment - 03/11/16 1434    Subjective Reports that pain is still there but scared to irritate it. Still has to do things at home. Reports that pain forces himself to try to stand upright.   Limitations Sitting;Standing   How long can you sit comfortably? 15 minutes.   How long can you stand comfortably? 15 minutes.   Patient Stated Goals Get out of pain and avoid surgery.   Currently in Pain? Yes   Pain Score 7    Pain Location Back   Pain Orientation Left;Lower   Pain Descriptors / Indicators Tightness   Pain Type Acute pain   Pain Onset 1 to 4 weeks ago            [facility] PT Assessment - 03/11/16 0001      Assessment   Medical Diagnosis left low back pain.     Precautions   Precaution Comments No ultrasound due to bladder CA.     Restrictions   Weight Bearing Restrictions No                     [facility] Adult PT Treatment/Exercise - 03/11/16 0001  Modalities   Modalities Electrical Stimulation;Moist Heat     Moist Heat Therapy   Number Minutes Moist Heat 15 Minutes   Moist Heat Location Lumbar Spine     Electrical Stimulation   Electrical Stimulation Location B low back   Electrical Stimulation Action Pre-Mod   Electrical Stimulation Parameters 80-150 hz x15 min   Electrical Stimulation Goals Pain     Manual Therapy   Manual Therapy Soft tissue mobilization   Soft tissue mobilization STW/MFR to L lumbar paraspinals/ QL to decrease tightness and pain in R sidelying                   PT Short Term Goals - 02/05/16 1518      PT SHORT TERM GOAL #1   Title STG's=LTG's.           PT Long Term Goals - 03/01/16 1425      PT LONG TERM GOAL #1   Title Independent with a HEP.   Time 8    Period Weeks   Status Achieved     PT LONG TERM GOAL #2   Title Sit 30 minutes with pain not > 3/10.   Time 8   Period Weeks   Status Achieved  With feet propped up per patient report or leaning forward per patient report 03/01/2016     PT LONG TERM GOAL #3   Title Sleep undisturbed 6 hours.   Time 8   Period Weeks   Status Partially Met  Has been sleeping 5-6 hours in bed per patient report 03/01/2016     PT LONG TERM GOAL #4   Title No c/o low back muscle spams.   Time 8   Period Weeks   Status Achieved  None back spasms in several weeks (3.5 weeks) per patient report 03/01/2016               Plan - 03/11/16 1437    Clinical Impression Statement Patient presented in clinic with continued L low back tightness that forces him to remind himself to stand upright. Tightness continues to be noted in L lumbar paraspinals region but patient did not report any sensitiviites to manual therapy. Normal modalities response noted following removal of the modalities. Patient continues to ttransfer and move with caution as he is afraid to irritate his low back. Patient educated to gently try SKTC and standing stretch that patient had been doing on his own as to report back next week of any improvement. Patient was educated to complete these stretches gently as to not aggravate his back more and to avoid rotation stretch at this time to avoid irritating his back more.   Rehab Potential Good   PT Frequency 2x / week   PT Duration 8 weeks   PT Treatment/Interventions ADLs/Self Care Home Management;Cryotherapy;Electrical Stimulation;Moist Heat;Patient/family education;Therapeutic exercise;Therapeutic activities;Manual techniques   PT Next Visit Plan Continue with core strengthening exercises with modalities PRN per patient report.   Consulted and Agree with Plan of Care Patient      Patient will benefit from skilled therapeutic intervention in order to improve the following deficits and  impairments:  Pain, Decreased activity tolerance, Decreased range of motion  Visit Diagnosis: Abnormal posture  Chronic left-sided low back pain without sciatica     Problem List Patient Active Problem List   Diagnosis Date Noted  . Back spasm 01/15/2016  . Generalized edema 01/15/2016  . Tobacco use disorder 12/19/2015  . Diabetes ([place]) 12/19/2015  . Hyperlipidemia 12/19/2015  .  Bladder cancer ([place]) 12/19/2015  . Esophageal reflux 12/19/2015  . Essential tremor 12/19/2015  . History of stroke 12/19/2015    Wynelle Fanny, PTA 03/11/2016, 2:50 PM  [facility] [address] [place], [place], [address] Phone: [phone]   Fax:  [phone]  Name: Gonzalo Waymire MRN: [id number] Date of Birth: [birth year]

## 2016-03-15 ENCOUNTER — Ambulatory Visit: Payer: Medicare Other | Admitting: Physical Therapy

## 2016-03-15 DIAGNOSIS — G8929 Other chronic pain: Secondary | ICD-10-CM | POA: Diagnosis not present

## 2016-03-15 DIAGNOSIS — M545 Low back pain: Principal | ICD-10-CM

## 2016-03-15 DIAGNOSIS — R293 Abnormal posture: Secondary | ICD-10-CM | POA: Diagnosis not present

## 2016-03-15 NOTE — Patient Instructions (Signed)
Trigger Point Dry Needling  . What is Trigger Point Dry Needling (DN)? o DN is a physical therapy technique used to treat muscle pain and dysfunction. Specifically, DN helps deactivate muscle trigger points (muscle knots).  o A thin filiform needle is used to penetrate the skin and stimulate the underlying trigger point. The goal is for a local twitch response (LTR) to occur and for the trigger point to relax. No medication of any kind is injected during the procedure.   . What Does Trigger Point Dry Needling Feel Like?  o The procedure feels different for each individual patient. Some patients report that they do not actually feel the needle enter the skin and overall the process is not painful. Very mild bleeding may occur. However, many patients feel a deep cramping in the muscle in which the needle was inserted. This is the local twitch response.   Marland Kitchen How Will I feel after the treatment? o Soreness is normal, and the onset of soreness may not occur for a few hours. Typically this soreness does not last longer than two days.  o Bruising is uncommon, however; ice can be used to decrease any possible bruising.  o In rare cases feeling tired or nauseous after the treatment is normal. In addition, your symptoms may get worse before they get better, this period will typically not last longer than 24 hours.   . What Can I do After My Treatment? o Increase your hydration by drinking more water for the next 24 hours. o You may place ice or heat on the areas treated that have become sore, however, do not use heat on inflamed or bruised areas. Heat often brings more relief post needling. o You can continue your regular activities, but vigorous activity is not recommended initially after the treatment for 24 hours. o DN is best combined with other physical therapy such as strengthening, stretching, and other therapies.    Precautions:  In some cases, dry needling is done over the lung field. While rare,  there is a risk of pneumothorax (punctured lung). Because of this, if you ever experience shortness of breath on exertion, difficulty taking a deep breath, chest pain or a dry cough following dry needling, you should report to an emergency room and tell them that you have been dry needled over the thorax.   Andrew [name], PT 03/15/16 2:37 PM [facility] [place], [place], [address] Phone: [phone]   Fax:  [phone]

## 2016-03-15 NOTE — Therapy (Signed)
[facility] [place], [place], [address] Phone: [phone]   Fax:  [phone]  Physical Therapy Treatment  Patient Details  Name: Andrew [name] MRN: [id number] Date of Birth: 1951-01-09 Referring Provider: Claretta Fraise MD  Encounter Date: 03/15/2016      PT End of Session - 03/15/16 1353    Visit Number 12   Number of Visits 17   Date for PT Re-Evaluation 04/05/16   PT Start Time 5631   PT Stop Time 1445   PT Time Calculation (min) 52 min   Activity Tolerance Patient tolerated treatment well   Behavior During Therapy [facility] for tasks assessed/performed      Past Medical History:  Diagnosis Date  . Anemia   . Arthritis   . Cancer [facility])    Bladder  . Depression   . Diabetes mellitus without complication ([place])   . GERD (gastroesophageal reflux disease)   . Hypercholesteremia   . Hyperlipidemia   . Neuromuscular disorder ([place])   . Status post tendon repair 1989  . Stroke ([place])    At times pt has dizziness with loss of vision  . Tremors of nervous system 2011    Past Surgical History:  Procedure Laterality Date  . APPENDECTOMY    . BLADDER SURGERY    . CYSTOSCOPY W/ RETROGRADES Bilateral 08/22/2015   Procedure: CYSTOSCOPY WITH RETROGRADE PYELOGRAM;  Surgeon: Irine Seal, MD;  Location: [facility];  Service: Urology;  Laterality: Bilateral;  . CYSTOSCOPY W/ RETROGRADES Bilateral 01/16/2016   Procedure: CYSTOSCOPY WITH RETROGRADE PYELOGRAM;  Surgeon: Irine Seal, MD;  Location: [facility];  Service: Urology;  Laterality: Bilateral;  . CYSTOSCOPY WITH BIOPSY N/A 08/22/2015   Procedure: CYSTOSCOPY WITH BLADDER BIOPSY;  Surgeon: Irine Seal, MD;  Location: [facility];  Service: Urology;  Laterality: N/A;  . CYSTOSCOPY WITH BIOPSY N/A 01/16/2016   Procedure: CYSTOSCOPY WITH BLADDER BIOPSY;  Surgeon: Irine Seal, MD;  Location: [facility];  Service: Urology;  Laterality: N/A;  . CYSTOSCOPY WITH FULGERATION N/A 01/16/2016   Procedure:  CYSTOSCOPY WITH FULGERATION;  Surgeon: Irine Seal, MD;  Location: [facility];  Service: Urology;  Laterality: N/A;  . KNEE ARTHROSCOPY Right 1989  . ROTATOR CUFF REPAIR Bilateral 2000    There were no vitals filed for this visit.      Subjective Assessment - 03/15/16 1353    Subjective Patient reports improvement overall, but still feels pain in the left low back. Pain is intermittent. Yesterday was good almost all day, but today pain is back.   Patient Stated Goals Get out of pain and avoid surgery.   Currently in Pain? Yes   Pain Score 6    Pain Location Back   Pain Orientation Right;Lower   Pain Descriptors / Indicators Tightness                         [facility] Adult PT Treatment/Exercise - 03/15/16 0001      Self-Care   Self-Care Other Self-Care Comments   Other Self-Care Comments  educated re: dry needling and discussed need to contact surgeon to okay due to surgery date of 01/16/16.     Lumbar Exercises: Stretches   Standing Side Bend 30 seconds;1 rep  seated     Lumbar Exercises: Standing   Other Standing Lumbar Exercises TPR with tennis ball and foam roller for self MFR     Lumbar Exercises: Sidelying   Other Sidelying Lumbar Exercises QL stretch over bolster with  focus on breath; PT assist to extend left leg to enhance stretch; pillow for support     Modalities   Modalities Electrical Stimulation;Moist Heat     Moist Heat Therapy   Number Minutes Moist Heat 15 Minutes   Moist Heat Location Lumbar Spine     Electrical Stimulation   Electrical Stimulation Location L Low back   Electrical Stimulation Action premod   Electrical Stimulation Parameters 80-150 Hz x 15 min   Electrical Stimulation Goals Pain     Manual Therapy   Manual Therapy Soft tissue mobilization;Myofascial release   Soft tissue mobilization STW/MFR to L lumbar paraspinals/ QL to decrease tightness and pain in R sidelying                 PT Education - 03/15/16 1450     Education provided Yes   Education Details see self care   Person(s) Educated Patient   Methods Explanation;Handout   Comprehension Verbalized understanding          PT Short Term Goals - 02/05/16 1518      PT SHORT TERM GOAL #1   Title STG's=LTG's.           PT Long Term Goals - 03/01/16 1425      PT LONG TERM GOAL #1   Title Independent with a HEP.   Time 8   Period Weeks   Status Achieved     PT LONG TERM GOAL #2   Title Sit 30 minutes with pain not > 3/10.   Time 8   Period Weeks   Status Achieved  With feet propped up per patient report or leaning forward per patient report 03/01/2016     PT LONG TERM GOAL #3   Title Sleep undisturbed 6 hours.   Time 8   Period Weeks   Status Partially Met  Has been sleeping 5-6 hours in bed per patient report 03/01/2016     PT LONG TERM GOAL #4   Title No c/o low back muscle spams.   Time 8   Period Weeks   Status Achieved  None back spasms in several weeks (3.5 weeks) per patient report 03/01/2016               Plan - 03/15/16 1450    Clinical Impression Statement Patient presents with continued c/o L low back pain/tightness. Patient tolerated manual therapy and stretching although he required VCs to breathe correctly as he is anxious regarding pain when he moves into certain positions. He continues to have marked tightness and pain with palpation to L QL and lumbar paraspinals. Patient would benefit from dry needling, but PT needs okay from his surgeon and prescribing MD before we can attempt this treatment. Requests have been made to both today.   Rehab Potential Good   PT Frequency 2x / week   PT Duration 8 weeks   PT Treatment/Interventions ADLs/Self Care Home Management;Cryotherapy;Electrical Stimulation;Moist Heat;Patient/family education;Therapeutic exercise;Therapeutic activities;Manual techniques;Dry needling   PT Next Visit Plan DN if approved by surgeon and prescribing MD. continue with manual therapy and  stretching.   PT Home Exercise Plan MFR with tennis ball or foam roller, stretching over bolster.   Consulted and Agree with Plan of Care Patient      Patient will benefit from skilled therapeutic intervention in order to improve the following deficits and impairments:  Pain, Decreased activity tolerance, Decreased range of motion  Visit Diagnosis: Chronic left-sided low back pain without sciatica - Plan: PT plan  of care cert/re-cert     Problem List Patient Active Problem List   Diagnosis Date Noted  . Back spasm 01/15/2016  . Generalized edema 01/15/2016  . Tobacco use disorder 12/19/2015  . Diabetes ([place]) 12/19/2015  . Hyperlipidemia 12/19/2015  . Bladder cancer ([place]) 12/19/2015  . Esophageal reflux 12/19/2015  . Essential tremor 12/19/2015  . History of stroke 12/19/2015    Madelyn Flavors PT 03/15/2016, 3:31 PM  [facility] [address] [place], [place], [address] Phone: [phone]   Fax:  [phone]  Name: Andrew [name] MRN: [id number] Date of Birth: July 04, 1950

## 2016-03-18 ENCOUNTER — Encounter: Payer: Medicare Other | Admitting: Physical Therapy

## 2016-03-19 ENCOUNTER — Ambulatory Visit: Payer: Medicare Other | Admitting: Physical Therapy

## 2016-03-19 DIAGNOSIS — R293 Abnormal posture: Secondary | ICD-10-CM | POA: Diagnosis not present

## 2016-03-19 DIAGNOSIS — G8929 Other chronic pain: Secondary | ICD-10-CM

## 2016-03-19 DIAGNOSIS — M545 Low back pain, unspecified: Secondary | ICD-10-CM

## 2016-03-19 NOTE — Therapy (Signed)
[facility] [place], [place], [address] Phone: [phone]   Fax:  [phone]  Physical Therapy Treatment  Patient Details  Name: Andrew [name] MRN: [id number] Date of Birth: [birth year] Referring Provider: Claretta Fraise MD  Encounter Date: 03/19/2016      PT End of Session - 03/19/16 0949    Visit Number 13   Number of Visits 17   Date for PT Re-Evaluation 04/05/16   PT Start Time 0949   PT Stop Time 1047   PT Time Calculation (min) 58 min   Activity Tolerance Patient tolerated treatment well   Behavior During Therapy [facility] for tasks assessed/performed      Past Medical History:  Diagnosis Date  . Anemia   . Arthritis   . Cancer [facility])    Bladder  . Depression   . Diabetes mellitus without complication ([place])   . GERD (gastroesophageal reflux disease)   . Hypercholesteremia   . Hyperlipidemia   . Neuromuscular disorder ([place])   . Status post tendon repair 1989  . Stroke ([place])    At times pt has dizziness with loss of vision  . Tremors of nervous system 2011    Past Surgical History:  Procedure Laterality Date  . APPENDECTOMY    . BLADDER SURGERY    . CYSTOSCOPY W/ RETROGRADES Bilateral 08/22/2015   Procedure: CYSTOSCOPY WITH RETROGRADE PYELOGRAM;  Surgeon: Irine Seal, MD;  Location: [facility];  Service: Urology;  Laterality: Bilateral;  . CYSTOSCOPY W/ RETROGRADES Bilateral 01/16/2016   Procedure: CYSTOSCOPY WITH RETROGRADE PYELOGRAM;  Surgeon: Irine Seal, MD;  Location: [facility];  Service: Urology;  Laterality: Bilateral;  . CYSTOSCOPY WITH BIOPSY N/A 08/22/2015   Procedure: CYSTOSCOPY WITH BLADDER BIOPSY;  Surgeon: Irine Seal, MD;  Location: [facility];  Service: Urology;  Laterality: N/A;  . CYSTOSCOPY WITH BIOPSY N/A 01/16/2016   Procedure: CYSTOSCOPY WITH BLADDER BIOPSY;  Surgeon: Irine Seal, MD;  Location: [facility];  Service: Urology;  Laterality: N/A;  . CYSTOSCOPY WITH FULGERATION N/A 01/16/2016   Procedure:  CYSTOSCOPY WITH FULGERATION;  Surgeon: Irine Seal, MD;  Location: [facility];  Service: Urology;  Laterality: N/A;  . KNEE ARTHROSCOPY Right 1989  . ROTATOR CUFF REPAIR Bilateral 2000    There were no vitals filed for this visit.      Subjective Assessment - 03/19/16 0949    Subjective Patient reports good results with tennis ball MFR.   Limitations Sitting;Standing   How long can you sit comfortably? 15 minutes.   How long can you stand comfortably? 15 minutes.   Patient Stated Goals Get out of pain and avoid surgery.   Currently in Pain? Yes   Pain Score 6    Pain Orientation Right;Lower   Pain Descriptors / Indicators Tightness   Pain Type Acute pain   Pain Onset More than a month ago   Pain Relieving Factors rest   Effect of Pain on Daily Activities unable to be as active as usual                         [facility] Adult PT Treatment/Exercise - 03/19/16 0001      Modalities   Modalities Electrical Stimulation;Moist Heat     Moist Heat Therapy   Number Minutes Moist Heat 15 Minutes   Moist Heat Location Lumbar Spine     Electrical Stimulation   Electrical Stimulation Location IFC to B lumbar/gluts x 15 min 80-150 Hz    Electrical  Stimulation Goals Pain     Manual Therapy   Manual Therapy Soft tissue mobilization   Soft tissue mobilization STW/MFR to B lumbar paraspinals and gluteals          Trigger Point Dry Needling - 03/19/16 1037    Consent Given? Yes   Education Handout Provided Yes   Muscles Treated Upper Body Quadratus Lumborum;Gluteus minimus;Gluteus maximus;Longissimus   Muscles Treated Lower Body Gluteus minimus;Gluteus maximus   Longissimus Response Twitch response elicited;Palpable increased muscle length  B multifidi   Gluteus Maximus Response Twitch response elicited;Palpable increased muscle length  B   Gluteus Minimus Response Twitch response elicited;Palpable increased muscle length  B              PT Education - 03/19/16  1043    Education provided Yes   Education Details Reviewed DN education and aftercare   Person(s) Educated Patient   Methods Explanation;Handout   Comprehension Verbalized understanding          PT Short Term Goals - 02/05/16 1518      PT SHORT TERM GOAL #1   Title STG's=LTG's.           PT Long Term Goals - 03/01/16 1425      PT LONG TERM GOAL #1   Title Independent with a HEP.   Time 8   Period Weeks   Status Achieved     PT LONG TERM GOAL #2   Title Sit 30 minutes with pain not > 3/10.   Time 8   Period Weeks   Status Achieved  With feet propped up per patient report or leaning forward per patient report 03/01/2016     PT LONG TERM GOAL #3   Title Sleep undisturbed 6 hours.   Time 8   Period Weeks   Status Partially Met  Has been sleeping 5-6 hours in bed per patient report 03/01/2016     PT LONG TERM GOAL #4   Title No c/o low back muscle spams.   Time 8   Period Weeks   Status Achieved  None back spasms in several weeks (3.5 weeks) per patient report 03/01/2016               Plan - 03/19/16 1043    Clinical Impression Statement DN was approved by patient's surgeon. Patient tolerated DN very well. He has multiple TPs in B gluteals as well as lumbar paraspinals with ++LTR noted. Normal response to modalities. Goals are ongoing.   PT Treatment/Interventions ADLs/Self Care Home Management;Cryotherapy;Electrical Stimulation;Moist Heat;Patient/family education;Therapeutic exercise;Therapeutic activities;Manual techniques;Dry needling   PT Next Visit Plan Assess DN and continue if indicated. Continue with Man and stretching to hips.   PT Home Exercise Plan MFR with tennis ball or foam roller, stretching over bolster.      Patient will benefit from skilled therapeutic intervention in order to improve the following deficits and impairments:  Pain, Decreased activity tolerance, Decreased range of motion  Visit Diagnosis: Chronic left-sided low back pain  without sciatica     Problem List Patient Active Problem List   Diagnosis Date Noted  . Back spasm 01/15/2016  . Generalized edema 01/15/2016  . Tobacco use disorder 12/19/2015  . Diabetes ([place]) 12/19/2015  . Hyperlipidemia 12/19/2015  . Bladder cancer ([place]) 12/19/2015  . Esophageal reflux 12/19/2015  . Essential tremor 12/19/2015  . History of stroke 12/19/2015    Madelyn Flavors PT 03/19/2016, 10:49 AM  [facility] [place], [place],  [place] Phone: [phone]   Fax:  [phone]  Name: Andrew [name] MRN: [id number] Date of Birth: 1951/01/18

## 2016-03-19 NOTE — Patient Instructions (Signed)
Trigger Point Dry Needling  . What is Trigger Point Dry Needling (DN)? o DN is a physical therapy technique used to treat muscle pain and dysfunction. Specifically, DN helps deactivate muscle trigger points (muscle knots).  o A thin filiform needle is used to penetrate the skin and stimulate the underlying trigger point. The goal is for a local twitch response (LTR) to occur and for the trigger point to relax. No medication of any kind is injected during the procedure.   . What Does Trigger Point Dry Needling Feel Like?  o The procedure feels different for each individual patient. Some patients report that they do not actually feel the needle enter the skin and overall the process is not painful. Very mild bleeding may occur. However, many patients feel a deep cramping in the muscle in which the needle was inserted. This is the local twitch response.   Marland Kitchen How Will I feel after the treatment? o Soreness is normal, and the onset of soreness may not occur for a few hours. Typically this soreness does not last longer than two days.  o Bruising is uncommon, however; ice can be used to decrease any possible bruising.  o In rare cases feeling tired or nauseous after the treatment is normal. In addition, your symptoms may get worse before they get better, this period will typically not last longer than 24 hours.   . What Can I do After My Treatment? o Increase your hydration by drinking more water for the next 24 hours. o You may place ice or heat on the areas treated that have become sore, however, do not use heat on inflamed or bruised areas. Heat often brings more relief post needling. o You can continue your regular activities, but vigorous activity is not recommended initially after the treatment for 24 hours. o DN is best combined with other physical therapy such as strengthening, stretching, and other therapies.    Precautions:  In some cases, dry needling is done over the lung field. While rare,  there is a risk of pneumothorax (punctured lung). Because of this, if you ever experience shortness of breath on exertion, difficulty taking a deep breath, chest pain or a dry cough following dry needling, you should report to an emergency room and tell them that you have been dry needled over the thorax.  Madelyn Flavors, PT 03/19/16 10:43 AM [facility] [address] [place], [place], [address] Phone: [phone]   Fax:  [phone]

## 2016-03-22 ENCOUNTER — Ambulatory Visit: Payer: Medicare Other | Admitting: Physical Therapy

## 2016-03-22 ENCOUNTER — Encounter: Payer: Self-pay | Admitting: Physical Therapy

## 2016-03-22 DIAGNOSIS — G8929 Other chronic pain: Secondary | ICD-10-CM

## 2016-03-22 DIAGNOSIS — R293 Abnormal posture: Secondary | ICD-10-CM | POA: Diagnosis not present

## 2016-03-22 DIAGNOSIS — M545 Low back pain, unspecified: Secondary | ICD-10-CM

## 2016-03-22 NOTE — Therapy (Signed)
[facility] [place], [place], [address] Phone: [phone]   Fax:  [phone]  Physical Therapy Treatment  Patient Details  Name: Andrew [name] MRN: [id number] Date of Birth: 1951/02/19 Referring Provider: Claretta Fraise MD  Encounter Date: 03/22/2016      PT End of Session - 03/22/16 1428    Visit Number 14   Number of Visits 17   Date for PT Re-Evaluation 04/05/16   PT Start Time 6270   PT Stop Time 1439   PT Time Calculation (min) 41 min   Activity Tolerance Patient tolerated treatment well   Behavior During Therapy [facility] for tasks assessed/performed      Past Medical History:  Diagnosis Date  . Anemia   . Arthritis   . Cancer [facility])    Bladder  . Depression   . Diabetes mellitus without complication ([place])   . GERD (gastroesophageal reflux disease)   . Hypercholesteremia   . Hyperlipidemia   . Neuromuscular disorder ([place])   . Status post tendon repair 1989  . Stroke ([place])    At times pt has dizziness with loss of vision  . Tremors of nervous system 2011    Past Surgical History:  Procedure Laterality Date  . APPENDECTOMY    . BLADDER SURGERY    . CYSTOSCOPY W/ RETROGRADES Bilateral 08/22/2015   Procedure: CYSTOSCOPY WITH RETROGRADE PYELOGRAM;  Surgeon: Irine Seal, MD;  Location: [facility];  Service: Urology;  Laterality: Bilateral;  . CYSTOSCOPY W/ RETROGRADES Bilateral 01/16/2016   Procedure: CYSTOSCOPY WITH RETROGRADE PYELOGRAM;  Surgeon: Irine Seal, MD;  Location: [facility];  Service: Urology;  Laterality: Bilateral;  . CYSTOSCOPY WITH BIOPSY N/A 08/22/2015   Procedure: CYSTOSCOPY WITH BLADDER BIOPSY;  Surgeon: Irine Seal, MD;  Location: [facility];  Service: Urology;  Laterality: N/A;  . CYSTOSCOPY WITH BIOPSY N/A 01/16/2016   Procedure: CYSTOSCOPY WITH BLADDER BIOPSY;  Surgeon: Irine Seal, MD;  Location: [facility];  Service: Urology;  Laterality: N/A;  . CYSTOSCOPY WITH FULGERATION N/A 01/16/2016   Procedure:  CYSTOSCOPY WITH FULGERATION;  Surgeon: Irine Seal, MD;  Location: [facility];  Service: Urology;  Laterality: N/A;  . KNEE ARTHROSCOPY Right 1989  . ROTATOR CUFF REPAIR Bilateral 2000    There were no vitals filed for this visit.      Subjective Assessment - 03/22/16 1427    Subjective Reports being a little better but still has sharp pains in the L side. Reports that he had a good day yesterday although he didn't do much of anything yesterday.   Limitations Sitting;Standing   How long can you sit comfortably? 15 minutes.   How long can you stand comfortably? 15 minutes.   Patient Stated Goals Get out of pain and avoid surgery.   Currently in Pain? Yes   Pain Score 5    Pain Location Back   Pain Orientation Left;Lower   Pain Descriptors / Indicators Tightness;Sharp   Pain Type Acute pain   Pain Onset More than a month ago            [facility] PT Assessment - 03/22/16 0001      Assessment   Medical Diagnosis left low back pain.     Precautions   Precaution Comments No ultrasound due to bladder CA.     Restrictions   Weight Bearing Restrictions No                     [facility] Adult PT Treatment/Exercise - 03/22/16  0001      Modalities   Modalities Electrical Stimulation;Moist Heat     Moist Heat Therapy   Number Minutes Moist Heat 15 Minutes   Moist Heat Location Lumbar Spine     Electrical Stimulation   Electrical Stimulation Location B lumbar paraspinals   Electrical Stimulation Action IFC   Electrical Stimulation Parameters 80-150 hz x15 min   Electrical Stimulation Goals Pain     Manual Therapy   Manual Therapy Soft tissue mobilization   Soft tissue mobilization STW/MFR to L lumbar paraspinals, QL to decrease tightness in R sidelying                  PT Short Term Goals - 02/05/16 1518      PT SHORT TERM GOAL #1   Title STG's=LTG's.           PT Long Term Goals - 03/01/16 1425      PT LONG TERM GOAL #1   Title Independent with a  HEP.   Time 8   Period Weeks   Status Achieved     PT LONG TERM GOAL #2   Title Sit 30 minutes with pain not > 3/10.   Time 8   Period Weeks   Status Achieved  With feet propped up per patient report or leaning forward per patient report 03/01/2016     PT LONG TERM GOAL #3   Title Sleep undisturbed 6 hours.   Time 8   Period Weeks   Status Partially Met  Has been sleeping 5-6 hours in bed per patient report 03/01/2016     PT LONG TERM GOAL #4   Title No c/o low back muscle spams.   Time 8   Period Weeks   Status Achieved  None back spasms in several weeks (3.5 weeks) per patient report 03/01/2016               Plan - 03/22/16 1515    Clinical Impression Statement Patient tolerated today's treatment well with mid level low back pain reported upon arrival at clinic. Patient presented with decreased tightness of L lumbar paraspinals and QL upon palpation although tightness still present. Tightness continued in L Glute region as well. Normal modalities response noted following removal of the modalities.   Rehab Potential Good   PT Frequency 2x / week   PT Duration 8 weeks   PT Treatment/Interventions ADLs/Self Care Home Management;Cryotherapy;Electrical Stimulation;Moist Heat;Patient/family education;Therapeutic exercise;Therapeutic activities;Manual techniques;Dry needling   PT Next Visit Plan Assess DN and continue if indicated. Continue with Man and stretching to hips.   PT Home Exercise Plan MFR with tennis ball or foam roller, stretching over bolster.   Consulted and Agree with Plan of Care Patient      Patient will benefit from skilled therapeutic intervention in order to improve the following deficits and impairments:  Pain, Decreased activity tolerance, Decreased range of motion  Visit Diagnosis: Chronic left-sided low back pain without sciatica  Abnormal posture     Problem List Patient Active Problem List   Diagnosis Date Noted  . Back spasm 01/15/2016   . Generalized edema 01/15/2016  . Tobacco use disorder 12/19/2015  . Diabetes ([place]) 12/19/2015  . Hyperlipidemia 12/19/2015  . Bladder cancer ([place]) 12/19/2015  . Esophageal reflux 12/19/2015  . Essential tremor 12/19/2015  . History of stroke 12/19/2015    Wynelle Fanny, PTA 03/22/2016, 3:31 PM  [facility] [address] [place], [place], [address] Phone: [phone]  Fax:  [phone]  Name: Ekansh Sherk MRN: [id number] Date of Birth: Sep 26, 1950

## 2016-03-25 ENCOUNTER — Encounter: Payer: Medicare Other | Admitting: Physical Therapy

## 2016-03-26 ENCOUNTER — Other Ambulatory Visit: Payer: Self-pay

## 2016-03-26 ENCOUNTER — Other Ambulatory Visit: Payer: Self-pay | Admitting: *Deleted

## 2016-03-26 ENCOUNTER — Ambulatory Visit: Payer: Medicare Other | Attending: Family Medicine | Admitting: Physical Therapy

## 2016-03-26 DIAGNOSIS — R609 Edema, unspecified: Secondary | ICD-10-CM

## 2016-03-26 DIAGNOSIS — G250 Essential tremor: Secondary | ICD-10-CM

## 2016-03-26 DIAGNOSIS — M545 Low back pain: Secondary | ICD-10-CM | POA: Insufficient documentation

## 2016-03-26 DIAGNOSIS — G8929 Other chronic pain: Secondary | ICD-10-CM | POA: Diagnosis not present

## 2016-03-26 MED ORDER — FUROSEMIDE 20 MG PO TABS: 20.0000 mg | ORAL_TABLET | Freq: Every day | ORAL | 0 refills | Status: DC

## 2016-03-26 MED ORDER — PRIMIDONE 50 MG PO TABS: 50.0000 mg | ORAL_TABLET | Freq: Every day | ORAL | 0 refills | Status: DC

## 2016-03-26 NOTE — Therapy (Signed)
[facility] [place], [place], [address] Phone: [phone]   Fax:  [phone]  Physical Therapy Treatment  Patient Details  Name: Andrew [name] MRN: [id number] Date of Birth: 10/04/1950 Referring Provider: Claretta Fraise MD  Encounter Date: 03/26/2016      PT End of Session - 03/26/16 1000    Visit Number 15   Number of Visits 17   Date for PT Re-Evaluation 04/05/16   PT Start Time 0952   PT Stop Time 1055   PT Time Calculation (min) 63 min   Activity Tolerance Patient tolerated treatment well   Behavior During Therapy [facility] for tasks assessed/performed      Past Medical History:  Diagnosis Date  . Anemia   . Arthritis   . Cancer [facility])    Bladder  . Depression   . Diabetes mellitus without complication ([place])   . GERD (gastroesophageal reflux disease)   . Hypercholesteremia   . Hyperlipidemia   . Neuromuscular disorder ([place])   . Status post tendon repair 1989  . Stroke ([place])    At times pt has dizziness with loss of vision  . Tremors of nervous system 2011    Past Surgical History:  Procedure Laterality Date  . APPENDECTOMY    . BLADDER SURGERY    . CYSTOSCOPY W/ RETROGRADES Bilateral 08/22/2015   Procedure: CYSTOSCOPY WITH RETROGRADE PYELOGRAM;  Surgeon: Irine Seal, MD;  Location: [facility];  Service: Urology;  Laterality: Bilateral;  . CYSTOSCOPY W/ RETROGRADES Bilateral 01/16/2016   Procedure: CYSTOSCOPY WITH RETROGRADE PYELOGRAM;  Surgeon: Irine Seal, MD;  Location: [facility];  Service: Urology;  Laterality: Bilateral;  . CYSTOSCOPY WITH BIOPSY N/A 08/22/2015   Procedure: CYSTOSCOPY WITH BLADDER BIOPSY;  Surgeon: Irine Seal, MD;  Location: [facility];  Service: Urology;  Laterality: N/A;  . CYSTOSCOPY WITH BIOPSY N/A 01/16/2016   Procedure: CYSTOSCOPY WITH BLADDER BIOPSY;  Surgeon: Irine Seal, MD;  Location: [facility];  Service: Urology;  Laterality: N/A;  . CYSTOSCOPY WITH FULGERATION N/A 01/16/2016   Procedure:  CYSTOSCOPY WITH FULGERATION;  Surgeon: Irine Seal, MD;  Location: [facility];  Service: Urology;  Laterality: N/A;  . KNEE ARTHROSCOPY Right 1989  . ROTATOR CUFF REPAIR Bilateral 2000    There were no vitals filed for this visit.      Subjective Assessment - 03/26/16 0958    Subjective Patient reports he felt great from treatment Friday until Sunday night and then with small movements of his arm the back pain became bad again. He feels he did not do anything out of the ordinary.   Limitations Sitting;Standing   How long can you sit comfortably? 15 minutes.   How long can you stand comfortably? 15 minutes.   Patient Stated Goals Get out of pain and avoid surgery.   Currently in Pain? Yes   Pain Score 6    Pain Location Back   Pain Orientation Lower;Left   Pain Descriptors / Indicators Sharp;Tightness   Pain Type Acute pain   Pain Onset More than a month ago   Pain Frequency Intermittent   Aggravating Factors  unsure   Pain Relieving Factors rest   Effect of Pain on Daily Activities unable to be as active as usual                         [facility] Adult PT Treatment/Exercise - 03/26/16 0001      Modalities   Modalities Electrical Stimulation;Moist Heat  Moist Heat Therapy   Number Minutes Moist Heat 15 Minutes   Moist Heat Location Lumbar Spine;Hip     Electrical Stimulation   Electrical Stimulation Location B lumbar/gluts IFC 80-_0  x 15 min   Electrical Stimulation Goals Pain     Manual Therapy   Manual Therapy Soft tissue mobilization   Soft tissue mobilization STW/MFR to B lumbar paraspinals, QL and L gluteals to decrease tightness in R sidelying          Trigger Point Dry Needling - 03/26/16 1048    Consent Given? Yes   Education Handout Provided No   Muscles Treated Upper Body Quadratus Lumborum  B   Muscles Treated Lower Body Gluteus minimus;Gluteus maximus   Gluteus Maximus Response Twitch response elicited;Palpable increased muscle length   L and medius   Gluteus Minimus Response Twitch response elicited;Palpable increased muscle length  L              PT Education - 03/26/16 1052    Education provided Yes   Education Details HEP; reviewed QL stretch and modified to seated or standing   Person(s) Educated Patient   Methods Explanation;Demonstration;Handout   Comprehension Verbalized understanding;Returned demonstration          PT Short Term Goals - 02/05/16 1518      PT SHORT TERM GOAL #1   Title STG's=LTG's.           PT Long Term Goals - 03/01/16 1425      PT LONG TERM GOAL #1   Title Independent with a HEP.   Time 8   Period Weeks   Status Achieved     PT LONG TERM GOAL #2   Title Sit 30 minutes with pain not > 3/10.   Time 8   Period Weeks   Status Achieved  With feet propped up per patient report or leaning forward per patient report 03/01/2016     PT LONG TERM GOAL #3   Title Sleep undisturbed 6 hours.   Time 8   Period Weeks   Status Partially Met  Has been sleeping 5-6 hours in bed per patient report 03/01/2016     PT LONG TERM GOAL #4   Title No c/o low back muscle spams.   Time 8   Period Weeks   Status Achieved  None back spasms in several weeks (3.5 weeks) per patient report 03/01/2016               Plan - 03/26/16 1054    Clinical Impression Statement Patient presents today with complaints of increased pain with certain UE movements. Patient continues to have increased tone in L gluteals as well as tightness in B QL. He responded well to DN with decreased tissue tension afterwards. He demos tightness in B HF and was given stretches for HEP. Goals are ongoing.   Rehab Potential Good   PT Frequency 2x / week   PT Duration 8 weeks   PT Treatment/Interventions ADLs/Self Care Home Management;Cryotherapy;Electrical Stimulation;Moist Heat;Patient/family education;Therapeutic exercise;Therapeutic activities;Manual techniques;Dry needling   PT Next Visit Plan Assess DN and  continue if indicated. Continue with Man and stretching to hips.   PT Home Exercise Plan MFR with tennis ball or foam roller, stretching over bolster.   Consulted and Agree with Plan of Care Patient      Patient will benefit from skilled therapeutic intervention in order to improve the following deficits and impairments:  Pain, Decreased activity tolerance, Decreased range of motion  Visit Diagnosis:  Chronic left-sided low back pain without sciatica     Problem List Patient Active Problem List   Diagnosis Date Noted  . Back spasm 01/15/2016  . Generalized edema 01/15/2016  . Tobacco use disorder 12/19/2015  . Diabetes ([place]) 12/19/2015  . Hyperlipidemia 12/19/2015  . Bladder cancer ([place]) 12/19/2015  . Esophageal reflux 12/19/2015  . Essential tremor 12/19/2015  . History of stroke 12/19/2015    Madelyn Flavors PT 03/26/2016, 11:13 AM  [facility] [place], [place], [address] Phone: [phone]   Fax:  [phone]  Name: Andrew [name] MRN: [id number] Date of Birth: March 05, 1951

## 2016-03-26 NOTE — Patient Instructions (Signed)
Flexors, Supine Bridge   Lie supine, feet shoulder-width apart. Lift hips toward ceiling. Hold 20 seconds. Repeat _3-5_ times per session. Do __2_ sessions per day.   Hip Flexor Stretch   Lying on back near edge of bed, bend one leg, foot flat. Hang other leg over edge, relaxed, thigh resting partially on bed for _1___ minutes. Repeat __3 times. Do _2-3___ sessions per day. Advanced Exercise: Bend knee back keeping thigh in contact with bed.   Quads / HF, Standing   Stand, holding onto chair and swing leg backward keeping back straight. Stop if it hurts your back. Repeat _10-15__ times per session. Do __2_ sessions per day. Copyright  [employer]. All rights reserved.    Madelyn Flavors, PT 03/26/16 10:51 AM [facility] [address] [place], [place], [address] Phone: [phone]   Fax:  [phone]

## 2016-03-30 ENCOUNTER — Ambulatory Visit: Payer: Medicare Other | Admitting: Physical Therapy

## 2016-04-02 ENCOUNTER — Ambulatory Visit: Payer: Medicare Other | Admitting: Physical Therapy

## 2016-04-02 DIAGNOSIS — G8929 Other chronic pain: Secondary | ICD-10-CM

## 2016-04-02 DIAGNOSIS — M545 Low back pain, unspecified: Secondary | ICD-10-CM

## 2016-04-02 NOTE — Therapy (Signed)
[facility] [place], [place], [address] Phone: [phone]   Fax:  [phone]  Physical Therapy Treatment  Patient Details  Name: Andrew [name] MRN: [id number] Date of Birth: May 15, 1951 Referring Provider: Claretta Fraise MD  Encounter Date: 04/02/2016      PT End of Session - 04/02/16 0950    Visit Number 16   Number of Visits 17   Date for PT Re-Evaluation 04/05/16   Authorization Type Gcode and KX   PT Start Time 0950   PT Stop Time 1053   PT Time Calculation (min) 63 min   Activity Tolerance Patient tolerated treatment well   Behavior During Therapy [facility] for tasks assessed/performed      Past Medical History:  Diagnosis Date  . Anemia   . Arthritis   . Cancer [facility])    Bladder  . Depression   . Diabetes mellitus without complication ([place])   . GERD (gastroesophageal reflux disease)   . Hypercholesteremia   . Hyperlipidemia   . Neuromuscular disorder ([place])   . Status post tendon repair 1989  . Stroke ([place])    At times pt has dizziness with loss of vision  . Tremors of nervous system 2011    Past Surgical History:  Procedure Laterality Date  . APPENDECTOMY    . BLADDER SURGERY    . CYSTOSCOPY W/ RETROGRADES Bilateral 08/22/2015   Procedure: CYSTOSCOPY WITH RETROGRADE PYELOGRAM;  Surgeon: Irine Seal, MD;  Location: [facility];  Service: Urology;  Laterality: Bilateral;  . CYSTOSCOPY W/ RETROGRADES Bilateral 01/16/2016   Procedure: CYSTOSCOPY WITH RETROGRADE PYELOGRAM;  Surgeon: Irine Seal, MD;  Location: [facility];  Service: Urology;  Laterality: Bilateral;  . CYSTOSCOPY WITH BIOPSY N/A 08/22/2015   Procedure: CYSTOSCOPY WITH BLADDER BIOPSY;  Surgeon: Irine Seal, MD;  Location: [facility];  Service: Urology;  Laterality: N/A;  . CYSTOSCOPY WITH BIOPSY N/A 01/16/2016   Procedure: CYSTOSCOPY WITH BLADDER BIOPSY;  Surgeon: Irine Seal, MD;  Location: [facility];  Service: Urology;  Laterality: N/A;  . CYSTOSCOPY WITH FULGERATION  N/A 01/16/2016   Procedure: CYSTOSCOPY WITH FULGERATION;  Surgeon: Irine Seal, MD;  Location: [facility];  Service: Urology;  Laterality: N/A;  . KNEE ARTHROSCOPY Right 1989  . ROTATOR CUFF REPAIR Bilateral 2000    There were no vitals filed for this visit.      Subjective Assessment - 04/02/16 0950    Subjective Patient states his pain level is about a 4/10. He states he can stand up straight from sitting without pain.    Patient Stated Goals Get out of pain and avoid surgery.   Currently in Pain? Yes   Pain Score 4    Pain Orientation Right;Left;Lower   Pain Descriptors / Indicators Aching   Pain Type Acute pain   Pain Onset More than a month ago   Pain Frequency Intermittent   Aggravating Factors  unsure   Pain Relieving Factors rest   Effect of Pain on Daily Activities unable to be as active as usual                         [facility] Adult PT Treatment/Exercise - 04/02/16 0001      Modalities   Modalities Electrical Stimulation;Moist Heat     Moist Heat Therapy   Number Minutes Moist Heat 15 Minutes   Moist Heat Location Lumbar Spine;Hip     Electrical Stimulation   Electrical Stimulation Location B lumbar/gluts IFC 80-_0  x 15  min   Electrical Stimulation Goals Pain     Manual Therapy   Manual Therapy Soft tissue mobilization   Soft tissue mobilization STW/MFR to B lumbar paraspinals, QL and B gluteals to decrease tightness in R sidelying          Trigger Point Dry Needling - 04/02/16 1046    Consent Given? Yes   Education Handout Provided No   Muscles Treated Upper Body Quadratus Lumborum  multifidi   Muscles Treated Lower Body Gluteus minimus;Gluteus maximus  glut med   Gluteus Maximus Response Twitch response elicited;Palpable increased muscle length  B   Gluteus Minimus Response Twitch response elicited;Palpable increased muscle length  B                PT Short Term Goals - 02/05/16 1518      PT SHORT TERM GOAL #1   Title  STG's=LTG's.           PT Long Term Goals - 04/02/16 0957      PT LONG TERM GOAL #1   Title Independent with a HEP.   Time 8   Period Weeks   Status Achieved     PT LONG TERM GOAL #2   Title Sit 30 minutes with pain not > 3/10.   Time 8   Period Weeks   Status Achieved     PT LONG TERM GOAL #3   Title Sleep undisturbed 6 hours.   Baseline back does not disturb his sleep now.   Time 8   Period Weeks   Status Achieved     PT LONG TERM GOAL #4   Title No c/o low back muscle spams.   Time 8   Period Weeks   Status Achieved               Plan - 04/02/16 1048    Clinical Impression Statement Patient presents today with reports of significant improvement in posture and pain. He reports achiness B across low back, but decreased intensity and he is able to stand in a neutral spine without pain. B QL and gluteals were dry needled today as the triggerpoints there are likely contributing to LBP.  Patient has significant tightnes B although L gluteals were not as tight since last visit. Patient states he is compliant with using tennis ball for MFR. All LTGs have been met. Patient to be [facility] next visit.   Rehab Potential Good   PT Frequency 2x / week   PT Duration 8 weeks   PT Treatment/Interventions ADLs/Self Care Home Management;Cryotherapy;Electrical Stimulation;Moist Heat;Patient/family education;Therapeutic exercise;Therapeutic activities;Manual techniques;Dry needling   PT Next Visit Plan GCode; D/C to HEP; DN as indicated   PT Home Exercise Plan MFR with tennis ball or foam roller, stretching over bolster.   Consulted and Agree with Plan of Care Patient      Patient will benefit from skilled therapeutic intervention in order to improve the following deficits and impairments:  Pain, Decreased activity tolerance, Decreased range of motion  Visit Diagnosis: Chronic left-sided low back pain without sciatica     Problem List Patient Active Problem List   Diagnosis  Date Noted  . Back spasm 01/15/2016  . Generalized edema 01/15/2016  . Tobacco use disorder 12/19/2015  . Diabetes ([place]) 12/19/2015  . Hyperlipidemia 12/19/2015  . Bladder cancer ([place]) 12/19/2015  . Esophageal reflux 12/19/2015  . Essential tremor 12/19/2015  . History of stroke 12/19/2015    Madelyn Flavors PT 04/02/2016, 11:04 AM  [facility] [place], [place], [address] Phone: [phone]   Fax:  [phone]  Name: Alyus Mofield MRN: [id number] Date of Birth: 1950/12/05

## 2016-04-09 ENCOUNTER — Ambulatory Visit: Payer: Medicare Other | Admitting: Physical Therapy

## 2016-04-09 DIAGNOSIS — M545 Low back pain: Secondary | ICD-10-CM | POA: Diagnosis not present

## 2016-04-09 DIAGNOSIS — G8929 Other chronic pain: Secondary | ICD-10-CM

## 2016-04-09 NOTE — Therapy (Signed)
[facility] [place], [place], [address] Phone: [phone]   Fax:  [phone]  Physical Therapy Treatment  Patient Details  Name: Andrew [name] MRN: [id number] Date of Birth: February 13, 1951 Referring Provider: Claretta Fraise MD  Encounter Date: 04/09/2016      PT End of Session - 04/09/16 0949    Visit Number 17   Number of Visits 17   Date for PT Re-Evaluation 04/05/16   Authorization Type Gcode and KX   PT Start Time [phone]   PT Stop Time 1052   PT Time Calculation (min) 63 min   Activity Tolerance Patient tolerated treatment well   Behavior During Therapy [facility] for tasks assessed/performed      Past Medical History:  Diagnosis Date  . Anemia   . Arthritis   . Cancer [facility])    Bladder  . Depression   . Diabetes mellitus without complication ([place])   . GERD (gastroesophageal reflux disease)   . Hypercholesteremia   . Hyperlipidemia   . Neuromuscular disorder ([place])   . Status post tendon repair 1989  . Stroke ([place])    At times pt has dizziness with loss of vision  . Tremors of nervous system 2011    Past Surgical History:  Procedure Laterality Date  . APPENDECTOMY    . BLADDER SURGERY    . CYSTOSCOPY W/ RETROGRADES Bilateral 08/22/2015   Procedure: CYSTOSCOPY WITH RETROGRADE PYELOGRAM;  Surgeon: Irine Seal, MD;  Location: [facility];  Service: Urology;  Laterality: Bilateral;  . CYSTOSCOPY W/ RETROGRADES Bilateral 01/16/2016   Procedure: CYSTOSCOPY WITH RETROGRADE PYELOGRAM;  Surgeon: Irine Seal, MD;  Location: [facility];  Service: Urology;  Laterality: Bilateral;  . CYSTOSCOPY WITH BIOPSY N/A 08/22/2015   Procedure: CYSTOSCOPY WITH BLADDER BIOPSY;  Surgeon: Irine Seal, MD;  Location: [facility];  Service: Urology;  Laterality: N/A;  . CYSTOSCOPY WITH BIOPSY N/A 01/16/2016   Procedure: CYSTOSCOPY WITH BLADDER BIOPSY;  Surgeon: Irine Seal, MD;  Location: [facility];  Service: Urology;  Laterality: N/A;  . CYSTOSCOPY WITH FULGERATION  N/A 01/16/2016   Procedure: CYSTOSCOPY WITH FULGERATION;  Surgeon: Irine Seal, MD;  Location: [facility];  Service: Urology;  Laterality: N/A;  . KNEE ARTHROSCOPY Right 1989  . ROTATOR CUFF REPAIR Bilateral 2000    There were no vitals filed for this visit.      Subjective Assessment - 04/09/16 0949    Subjective Patient states he has had some good days, but it's up and down. Patient has had more relief and more function, but still has significant pain. He thinks that it isn't going to improve until he does something about his feet. He has had foot pain most of his life.    Limitations Sitting;Standing   How long can you sit comfortably? 15 minutes.   How long can you stand comfortably? 15 minutes.   Patient Stated Goals Get out of pain and avoid surgery.   Currently in Pain? Yes   Pain Score 6    Pain Location Back   Pain Orientation Right;Left;Lower   Pain Onset More than a month ago   Pain Frequency Intermittent   Aggravating Factors  certain moves hurt like crazy, dynamic HS stretch   Pain Relieving Factors standing lumbar flexion/ext (pelvic rocking) and using tennis ball.   Effect of Pain on Daily Activities unable to be as active as usual  [place] Adult PT Treatment/Exercise - 05/01/16 0001      Self-Care   Self-Care Other Self-Care Comments   Other Self-Care Comments  Discussed patient current status, how is feet may be playing into back pain and discussing patient's condition overall (patient tearful)          Trigger Point Dry Needling - May 01, 2016 1302    Consent Given? Yes   Education Handout Provided No   Muscles Treated Upper Body Quadratus Lumborum  B   Muscles Treated Lower Body Gluteus minimus;Gluteus maximus  B   Gluteus Maximus Response Twitch response elicited;Palpable increased muscle length  B   Gluteus Minimus Response Twitch response elicited;Palpable increased muscle length  B              PT Education -  May 01, 2016 1303    Education provided Yes   Education Details HEP; trunk rotation stretches   Person(s) Educated Patient   Methods Explanation;Demonstration   Comprehension Verbalized understanding;Returned demonstration          PT Short Term Goals - 02/05/16 1518      PT SHORT TERM GOAL #1   Title STG's=LTG's.           PT Long Term Goals - 04/02/16 0957      PT LONG TERM GOAL #1   Title Independent with a HEP.   Time 8   Period Weeks   Status Achieved     PT LONG TERM GOAL #2   Title Sit 30 minutes with pain not > 3/10.   Time 8   Period Weeks   Status Achieved     PT LONG TERM GOAL #3   Title Sleep undisturbed 6 hours.   Baseline back does not disturb his sleep now.   Time 8   Period Weeks   Status Achieved     PT LONG TERM GOAL #4   Title No c/o low back muscle spams.   Time 8   Period Weeks   Status Achieved               Plan - 05-01-2016 1304    Clinical Impression Statement Patient presents today for discharge visit. He reports improvement overall, but was tearful when talking of how he cannot do what he used to do and feels he is more limited than he should be at his age. He was given trunk rotation stretches to add to HEP. He tolerated DN well again. His L side remains tighter than left.    Rehab Potential Good   PT Frequency 2x / week   PT Duration 8 weeks   PT Treatment/Interventions ADLs/Self Care Home Management;Cryotherapy;Electrical Stimulation;Moist Heat;Patient/family education;Therapeutic exercise;Therapeutic activities;Manual techniques;Dry needling   PT Next Visit Plan see d/c summary   PT Home Exercise Plan trunk rotation stretch; MFR with tennis ball or foam roller, stretching over bolster.   Consulted and Agree with Plan of Care Patient      Patient will benefit from skilled therapeutic intervention in order to improve the following deficits and impairments:  Pain, Decreased activity tolerance, Decreased range of  motion  Visit Diagnosis: Chronic left-sided low back pain without sciatica       G-Codes - 05-01-2016 1307    Functional Assessment Tool Used FOTO d/c visit   Functional Limitation Mobility: Walking and moving around   Mobility: Walking and Moving Around Current Status ([id number]) At least 40 percent but less than 60 percent impaired, limited or restricted   Mobility: Walking and Moving Around  Goal Status [phone]) At least 40 percent but less than 60 percent impaired, limited or restricted   Mobility: Walking and Moving Around Discharge Status [phone]) At least 40 percent but less than 60 percent impaired, limited or restricted      Problem List Patient Active Problem List   Diagnosis Date Noted  . Back spasm 01/15/2016  . Generalized edema 01/15/2016  . Tobacco use disorder 12/19/2015  . Diabetes ([place]) 12/19/2015  . Hyperlipidemia 12/19/2015  . Bladder cancer ([place]) 12/19/2015  . Esophageal reflux 12/19/2015  . Essential tremor 12/19/2015  . History of stroke 12/19/2015    Madelyn Flavors PT 04/09/2016, 1:13 PM  [facility] [address] [place], [place], [address] Phone: [phone]   Fax:  [phone]  Name: Andrew [name] MRN: [id number] Date of Birth: 12-05-1950   PHYSICAL THERAPY DISCHARGE SUMMARY  Visits from Start of Care: 17  Current functional level related to goals / functional outcomes: See above   Remaining deficits: See above   Education / Equipment: HEP Plan: Patient agrees to discharge.  Patient goals were met. Patient is being discharged due to meeting the stated rehab goals.  ?????         Madelyn Flavors, PT 04/09/16 1:14 PM [facility] [place], [place], [address] Phone: [phone]   Fax:  [phone]

## 2016-04-30 ENCOUNTER — Ambulatory Visit (INDEPENDENT_AMBULATORY_CARE_PROVIDER_SITE_OTHER): Payer: Medicare Other | Admitting: Urology

## 2016-04-30 ENCOUNTER — Inpatient Hospital Stay (HOSPITAL_COMMUNITY): Admission: AD | Admit: 2016-04-30 | Payer: Self-pay

## 2016-04-30 ENCOUNTER — Other Ambulatory Visit (HOSPITAL_COMMUNITY)
Admission: RE | Admit: 2016-04-30 | Discharge: 2016-04-30 | Disposition: A | Discharge status: Home / Self Care | Payer: Medicare Other | Source: Ambulatory Visit | Attending: Urology | Admitting: Urology

## 2016-04-30 DIAGNOSIS — Z8551 Personal history of malignant neoplasm of bladder: Secondary | ICD-10-CM | POA: Insufficient documentation

## 2016-04-30 DIAGNOSIS — R8299 Other abnormal findings in urine: Secondary | ICD-10-CM | POA: Diagnosis not present

## 2016-07-05 DIAGNOSIS — Z7984 Long term (current) use of oral hypoglycemic drugs: Secondary | ICD-10-CM | POA: Diagnosis not present

## 2016-07-05 DIAGNOSIS — E119 Type 2 diabetes mellitus without complications: Secondary | ICD-10-CM | POA: Diagnosis not present

## 2016-07-05 LAB — HM DIABETES EYE EXAM

## 2016-07-06 ENCOUNTER — Ambulatory Visit (INDEPENDENT_AMBULATORY_CARE_PROVIDER_SITE_OTHER): Payer: Medicare Other | Admitting: Pediatrics

## 2016-07-06 ENCOUNTER — Encounter: Payer: Self-pay | Admitting: Pediatrics

## 2016-07-06 VITALS — BP 128/77 | HR 86 | Temp 97.5°F | Ht 75.0 in | Wt 222.0 lb

## 2016-07-06 DIAGNOSIS — G250 Essential tremor: Secondary | ICD-10-CM

## 2016-07-06 DIAGNOSIS — E785 Hyperlipidemia, unspecified: Secondary | ICD-10-CM

## 2016-07-06 DIAGNOSIS — R609 Edema, unspecified: Secondary | ICD-10-CM

## 2016-07-06 DIAGNOSIS — E119 Type 2 diabetes mellitus without complications: Secondary | ICD-10-CM | POA: Diagnosis not present

## 2016-07-06 DIAGNOSIS — F17200 Nicotine dependence, unspecified, uncomplicated: Secondary | ICD-10-CM | POA: Diagnosis not present

## 2016-07-06 LAB — BAYER DCA HB A1C WAIVED: HB A1C (BAYER DCA - WAIVED): 10.2 % — ABNORMAL HIGH (ref <7.0)

## 2016-07-06 MED ORDER — CANAGLIFLOZIN 100 MG PO TABS: 300.0000 mg | ORAL_TABLET | Freq: Every day | ORAL | 2 refills | Status: DC

## 2016-07-06 MED ORDER — FUROSEMIDE 40 MG PO TABS: 40.0000 mg | ORAL_TABLET | Freq: Every day | ORAL | 1 refills | Status: DC

## 2016-07-06 MED ORDER — CANAGLIFLOZIN 100 MG PO TABS: 100.0000 mg | ORAL_TABLET | Freq: Every day | ORAL | 0 refills | Status: DC

## 2016-07-06 NOTE — Progress Notes (Signed)
  Subjective:   Patient ID: Andrew [name], male    DOB: October 25, 1950, 66 y.o.   MRN: [id number] CC: Follow-up (diabetes)  HPI: Andrew [name] is a 66 y.o. male presenting for Follow-up (diabetes)  Taking primidone for essential tremor Has been helping some  DM2: Avoiding sugar Checking BGLs some at home, upper 100s in the morning Taking meds regularly  Smoking regularly Has not yet started well butrin Wants to stop, not ready to quit yet  Stays active, workin garound property, just bought more acreage No SOB, no CP Has some swelling in LE by the end of the day  Relevant past medical, surgical, family and social history reviewed. Allergies and medications reviewed and updated. History  Smoking Status  . Current Every Day Smoker  . Packs/day: 1.00  . Years: 42.00  . Types: Cigarettes  Smokeless Tobacco  . Never Used   ROS: Per HPI   Objective:    BP 128/77   Pulse 86   Temp 97.5 F (36.4 C) (Oral)   Ht 6\' 3"  (1.905 m)   Wt 222 lb (100.7 kg)   BMI 27.75 kg/m   Wt Readings from Last 3 Encounters:  07/06/16 222 lb (100.7 kg)  02/25/16 222 lb 6.4 oz (100.9 kg)  02/16/16 221 lb (100.2 kg)    Gen: NAD, alert, cooperative with exam, NCAT EYES: no conjunctival injection, or no icterus CV: NRRR, normal S1/S2, no murmur, distal pulses 2+ b/l Resp: CTABL, no wheezes, normal WOB Abd: +BS, soft, NTND. no guarding or organomegaly Ext: 1+ b/l LE edema at ankles to distal shin, warm Neuro: Alert and oriented  Assessment & Plan:  Andrew [name] was seen today for follow-up.  Diagnoses and all orders for this visit:  Type 2 diabetes mellitus without complication, without long-term current use of insulin (HCC) A1c still elevated Restart invokana Decrease sugar intake Cont to check in AM F/u with Tammy within next 4 weeks -     Bayer DCA Hb A1c Waived -     canagliflozin (INVOKANA) 100 MG TABS tablet; Take 3 tablets (300 mg total) by mouth daily before breakfast.  Swelling Cont  lasix, needs BMP next visit -     furosemide (LASIX) 40 MG tablet; Take 1 tablet (40 mg total) by mouth daily.  Hyperlipidemia, unspecified hyperlipidemia type Stable, Cont pravastatin  Essential tremor Cont primidone, helps some with tremor  Tobacco use disorder Has Rx for wellbutrin to help with cessation  Follow up plan: Return for AWV with Tammy when able, 3 mo wiht tammy for DM2 check, 6 mo to see Dr. Evette Doffing. Assunta Found, MD [place]

## 2016-07-08 ENCOUNTER — Other Ambulatory Visit: Payer: Self-pay | Admitting: Pediatrics

## 2016-07-08 DIAGNOSIS — G250 Essential tremor: Secondary | ICD-10-CM

## 2016-08-03 ENCOUNTER — Ambulatory Visit: Payer: Medicare Other | Admitting: Pharmacist

## 2016-08-10 ENCOUNTER — Encounter: Payer: Self-pay | Admitting: Pharmacist

## 2016-08-10 ENCOUNTER — Ambulatory Visit (INDEPENDENT_AMBULATORY_CARE_PROVIDER_SITE_OTHER): Payer: Medicare Other | Admitting: Pharmacist

## 2016-08-10 VITALS — BP 120/72 | HR 84 | Ht 75.0 in | Wt 220.5 lb

## 2016-08-10 DIAGNOSIS — E119 Type 2 diabetes mellitus without complications: Secondary | ICD-10-CM

## 2016-08-10 DIAGNOSIS — Z0000 Encounter for general adult medical examination without abnormal findings: Secondary | ICD-10-CM

## 2016-08-10 MED ORDER — CANAGLIFLOZIN 100 MG PO TABS: 200.0000 mg | ORAL_TABLET | Freq: Every day | ORAL | 1 refills | Status: DC

## 2016-08-10 NOTE — Patient Instructions (Addendum)
  Andrew [name] , Thank you for taking time to come for your Medicare Wellness Visit. I appreciate your ongoing commitment to your health goals. Please review the following plan we discussed and let me know if I can assist you in the future.   These are the goals we discussed:  Look for copy of [place] (important to know where these are kept - you can also bring copy to our office to be placed in our file / electronic chart)  Consider getting involved in volunteer organization (like [employer]) or consider exercise at [facility] through [employer] porgram  Consider getting CT of lung for lung cancer screening  Remember to bring back stool samples - FOBT for colon cancer screening  Goal Blood glucose:    Fasting (before meals) = 80 to 130   Within 2 hours of eating = less than 180  Last A1c was 10.2%.  Our goal is 7.0%.  Try to have no more than 3 of these foods per meal:  Fruit:   1/2 cup or once piece (baseball size)- apples, pears, pineapple, peaches, oranges  1 cup - berries, melons  1/2 banana or grapefruit  Stachy Vegetables:   1/2 cup potatoes (white or sweet), corn, peas  Other starches:   1 piece of bread  1/2 cup rice or pasta  4 inch pancake    These foods you can eat more freely: Proteins:   Fish  Chicken or [place]  Beef or pork (1 or 2 servings per week)  Eggs  Nuts (peanuts, walnuts, almonds, pistachios)  Cheese  Non starchy vegetables:  Green beans  Broccoli or cauliflower  Lettuce, greens, cabbage  Brussel Sprout  Carrots  Onions and peppers  Celery  Tomatoes  Asparagus  Eggplant  Cucumbers  Squash and Zucchini      This is a list of the screening recommended for you and due dates:  Health Maintenance  Topic Date Due  .  Hepatitis C: One time screening is recommended by Center for Disease Control  (CDC) for  adults born from [birth year] through 1966.   June 10, 1950  . Eye exam  for diabetics  01/23/1960  . Tetanus Vaccine  01/22/1969  . Colon Cancer Screening  01/23/2000  . Pneumonia vaccines (1 of 2 - PCV13) 01/23/2015  . Urine Protein Check  06/12/2016  . Hemoglobin A1C  01/03/2017  . Complete foot exam   02/24/2017  . Flu Shot  Completed

## 2016-08-10 NOTE — Progress Notes (Addendum)
Patient ID: Andrew [name], male   DOB: 02-17-1951, 66 y.o.   MRN: [id number]     Subjective:   Andrew [name] is a 66 y.o. male who presents for an Initial Medicare Annual Wellness Visit and to review uncontrolled type 2 DM.  His BG has been well controlled until he reached Medicare coverage gap and could not afford Invokana from October 2017 thru January 2018.  He recently restarted Invokana in February at 100mg  daily and increased to 200mg  daily about 3 days ago. Patient denies polyuria or polydipsia but does states that he has been more fatigued lately.  Seems to be improving with lower BG.  HBG readings range from 133 to 198 over the last month.  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  Lives alone but family is close by Alcohol/Tobacco/Substances:  occ EtOH use; current smoker  Patient was given Rx by PCP for buproprion for smoking cessation but he has not filled yet   Because he feel that he is not ready to stop smoking.     Current Medications (verified) Outpatient Encounter Prescriptions as of 08/10/2016  Medication Sig  . aspirin 81 MG tablet Take 81 mg by mouth daily.  . canagliflozin (INVOKANA) 100 MG TABS tablet Take 2-3 tablets (200-300 mg total) by mouth daily before breakfast.  . diclofenac (VOLTAREN) 75 MG EC tablet Take 1 tablet (75 mg total) by mouth 2 (two) times daily. For muscle and  Joint pain  . furosemide (LASIX) 40 MG tablet Take 1 tablet (40 mg total) by mouth daily.  Marland Kitchen glimepiride (AMARYL) 4 MG tablet Take 2 tablets (8 mg total) by mouth daily with breakfast.  . HYDROcodone-acetaminophen (NORCO) 5-325 MG tablet Take 1 tablet by mouth every 6 (six) hours as needed for moderate pain.  . metFORMIN (GLUCOPHAGE)  1000 MG tablet Take 1 tablet (1,000 mg total) by mouth 2 (two) times daily with a meal.  . omeprazole (PRILOSEC) 20 MG capsule Take 1 capsule (20 mg total) by mouth daily.  . pravastatin (PRAVACHOL) 80 MG tablet Take 1 tablet (80 mg total) by mouth daily.  . primidone (MYSOLINE) 50 MG tablet TAKE 1 TABLET DAILY  . [DISCONTINUED] canagliflozin (INVOKANA) 100 MG TABS tablet Take 3 tablets (300 mg total) by mouth daily before breakfast. (Patient taking differently: Take 200-300 mg by mouth daily before breakfast. )  . buPROPion (WELLBUTRIN SR) 150 MG 12 hr tablet Take 1 tablet (150 mg total) by mouth 2 (two) times daily. (Patient not taking: Reported on 08/10/2016)  . [DISCONTINUED] methocarbamol (ROBAXIN) 500 MG tablet Take 1 tablet (500 mg total) by mouth 4 (four) times daily. (Patient not taking: Reported on 08/10/2016)  . [DISCONTINUED] naproxen sodium (ANAPROX) 220 MG tablet Take 220 mg by mouth daily as needed (pain).   No facility-administered encounter medications on file as of 08/10/2016.     Allergies (verified) Trulicity [dulaglutide]   History: Past Medical History:  Diagnosis Date  . Anemia   . Arthritis   . Cancer [facility])    Bladder  . Depression   . Diabetes mellitus without complication ([place])   . GERD (gastroesophageal reflux disease)   . Hypercholesteremia   . Hyperlipidemia   . Neuromuscular disorder ([place])   .  Status post tendon repair 1989  . Stroke ([place])    At times pt has dizziness with loss of vision  . Tremors of nervous system 2011   Past Surgical History:  Procedure Laterality Date  . APPENDECTOMY    . BLADDER SURGERY    . CYSTOSCOPY W/ RETROGRADES Bilateral 08/22/2015   Procedure: CYSTOSCOPY WITH RETROGRADE PYELOGRAM;  Surgeon: Irine Seal, MD;  Location: [facility];  Service: Urology;  Laterality: Bilateral;  . CYSTOSCOPY W/ RETROGRADES Bilateral 01/16/2016   Procedure: CYSTOSCOPY WITH RETROGRADE PYELOGRAM;  Surgeon: Irine Seal, MD;  Location: [facility];  Service:  Urology;  Laterality: Bilateral;  . CYSTOSCOPY WITH BIOPSY N/A 08/22/2015   Procedure: CYSTOSCOPY WITH BLADDER BIOPSY;  Surgeon: Irine Seal, MD;  Location: [facility];  Service: Urology;  Laterality: N/A;  . CYSTOSCOPY WITH BIOPSY N/A 01/16/2016   Procedure: CYSTOSCOPY WITH BLADDER BIOPSY;  Surgeon: Irine Seal, MD;  Location: [facility];  Service: Urology;  Laterality: N/A;  . CYSTOSCOPY WITH FULGERATION N/A 01/16/2016   Procedure: CYSTOSCOPY WITH FULGERATION;  Surgeon: Irine Seal, MD;  Location: [facility];  Service: Urology;  Laterality: N/A;  . KNEE ARTHROSCOPY Right 1989  . ROTATOR CUFF REPAIR Bilateral 2000   Family History  Problem Relation Age of Onset  . Diabetes Mother 71    type 1  . Stroke Mother   . Dementia Father   . Diabetes Brother   . Heart attack Brother   . Cancer Brother     testicular   Social History   Occupational History  . Not on file.   Social History Main Topics  . Smoking status: Current Every Day Smoker    Packs/day: 1.00    Years: 42.00    Types: Cigarettes  . Smokeless tobacco: Never Used  . Alcohol use No  . Drug use: No  . Sexual activity: No    Do you feel safe at home?  Yes Are there smokers in your home (other than you)? No  Dietary issues and exercise activities discussed: Current Exercise Habits: The patient does not participate in regular exercise at present;The patient has a physically strenous job, but has no regular exercise apart from work. 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     Objective:    Today's Vitals   08/10/16 0953  BP: 120/72  Pulse: 84  Weight: 220 lb 8 oz (100 kg)  Height: 6\' 3"  (1.905 m)  PainSc: 0-No pain   Body mass index is 27.56 kg/m.   Activities of Daily Living In your present state of health, do you have any difficulty performing the following activities: 08/10/2016 01/13/2016  Hearing? N N  Vision? N N  Difficulty concentrating  or making decisions? N N  Walking or climbing stairs? N Y  Dressing or bathing? N N  Doing errands, shopping? N N  Preparing Food and eating ? N -  Using the Toilet? N -  In the past six months, have you accidently leaked urine? N -  Do you have problems with loss of bowel control? N -  Managing your Medications? N -  Managing your Finances? N -  Housekeeping or managing your Housekeeping? N -  Some recent data might be hidden     Depression Screen PHQ 2/9 Scores 08/10/2016 07/06/2016 02/25/2016 02/16/2016  PHQ - 2 Score 2 0 0 0  PHQ- 9 Score 8 - - -  Fall Risk Fall Risk  08/10/2016 07/06/2016 02/25/2016 01/28/2016 01/15/2016  Falls in the past year? No No No No No    Cognitive Function: MMSE - Mini Mental State Exam 08/10/2016  Orientation to time 5  Orientation to Place 5  Registration 3  Attention/ Calculation 4  Recall 2  Language- name 2 objects 2  Language- repeat 1  Language- follow 3 step command 3  Language- read & follow direction 1  Write a sentence 1  Copy design 1  Total score 28    Immunizations and Health Maintenance Immunization History  Administered Date(s) Administered  . Influenza, High Dose Seasonal PF 02/25/2016   Health Maintenance Due  Topic Date Due  . Hepatitis C Screening  10/14/1950  . OPHTHALMOLOGY EXAM  01/23/1960  . TETANUS/TDAP  01/22/1969  . COLONOSCOPY  01/23/2000  . PNA vac Low Risk Adult (1 of 2 - PCV13) 01/23/2015  . URINE MICROALBUMIN  06/12/2016    Patient Care Team: Eustaquio Maize, MD as PCP - General (Pediatrics)  Indicate any recent Medical Services you may have received from other than [facility] providers in the past year (date may be approximate).    Assessment:    Annual Wellness Visit    Screening Tests Health Maintenance  Topic Date Due  . Hepatitis C Screening  04-22-1951  . OPHTHALMOLOGY EXAM  01/23/1960  . TETANUS/TDAP  01/22/1969  . COLONOSCOPY  01/23/2000  . PNA vac Low Risk Adult (1 of 2 - PCV13)  01/23/2015  . URINE MICROALBUMIN  06/12/2016  . HEMOGLOBIN A1C  01/03/2017  . FOOT EXAM  02/24/2017  . INFLUENZA VACCINE  Completed        Plan:   During the course of the visit Andrew [name] was educated and counseled about the following appropriate screening and preventive services:   Vaccines to include Pneumoccal, Influenza,  Td, Zostavax - Declined vaccines today but he will consider getting in Fall with influenza vaccine - due Pneumvax 13.  Shingles vaccine and Tdap (cost of Shingrix $47 and Boostrix $47)  Colorectal cancer screening - patient has FOBT at home, reminded to complete and RTO  Cardiovascular disease screening - EKG last 07/2015  BP was at goal in office today  Lipids - LDL is at goal but Tg elevated - should see decrease in Tg with improved BG.  Also see below for nutrition counseling  Diabetes - patient will continue Invokana 200mg  daily. We will consider increaseing to 300mg  daily as needed.  I have given him #15 of the Invokana 100mg  samples and Rx sent to his pharmacy for #90 of Invokana 100mg  take 2-3 tablets daily.    Discussed Medicare coverage gap and assess patient's qualification for LIS.  He does not qualify for LIS. He has advised to contact me if he reaches Medicare coverage gap.  I can assist in application from drug manufacturer for assistance due this time.  Glaucoma screening / Diabetic Eye Exam - UTD, requesting records from Dr Rona Ravens  Nutrition counseling - discussed CHO counting diet in depth and My Plate handout given and discussed.  This type of dietary changes should help with BG and Tg  Smoking cessation counseling - Discussed pharmacotherapy options. He has buproprion from Dr Evette Doffing and will start once he is ready to quit.    Ordered CT of lungs / lung cancer screening  Advanced Directives - copy requested  Physical Activity - discussed [employer] program with patient.  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  He voices that he will consider.  Also mention local volunteer opportunities.     Patient Instructions (the written plan) were given to the patient.   Cherre Robins, PharmD   08/10/2016

## 2016-08-13 ENCOUNTER — Ambulatory Visit: Payer: Medicare Other | Admitting: Urology

## 2016-10-01 ENCOUNTER — Ambulatory Visit (INDEPENDENT_AMBULATORY_CARE_PROVIDER_SITE_OTHER): Payer: Medicare Other | Admitting: Urology

## 2016-10-01 DIAGNOSIS — C670 Malignant neoplasm of trigone of bladder: Secondary | ICD-10-CM

## 2016-10-06 ENCOUNTER — Encounter: Payer: Self-pay | Admitting: Pediatrics

## 2016-10-06 ENCOUNTER — Ambulatory Visit (INDEPENDENT_AMBULATORY_CARE_PROVIDER_SITE_OTHER): Payer: Medicare Other | Admitting: Pediatrics

## 2016-10-06 VITALS — BP 119/70 | HR 90 | Temp 97.5°F | Ht 75.0 in | Wt 217.0 lb

## 2016-10-06 DIAGNOSIS — G250 Essential tremor: Secondary | ICD-10-CM

## 2016-10-06 DIAGNOSIS — Z1159 Encounter for screening for other viral diseases: Secondary | ICD-10-CM

## 2016-10-06 DIAGNOSIS — E119 Type 2 diabetes mellitus without complications: Secondary | ICD-10-CM

## 2016-10-06 DIAGNOSIS — L928 Other granulomatous disorders of the skin and subcutaneous tissue: Secondary | ICD-10-CM | POA: Diagnosis not present

## 2016-10-06 DIAGNOSIS — F17200 Nicotine dependence, unspecified, uncomplicated: Secondary | ICD-10-CM

## 2016-10-06 DIAGNOSIS — G4730 Sleep apnea, unspecified: Secondary | ICD-10-CM | POA: Diagnosis not present

## 2016-10-06 DIAGNOSIS — L918 Other hypertrophic disorders of the skin: Secondary | ICD-10-CM

## 2016-10-06 DIAGNOSIS — Z1211 Encounter for screening for malignant neoplasm of colon: Secondary | ICD-10-CM

## 2016-10-06 LAB — BAYER DCA HB A1C WAIVED: HB A1C (BAYER DCA - WAIVED): 7.4 % — ABNORMAL HIGH (ref <7.0)

## 2016-10-06 NOTE — Progress Notes (Signed)
Subjective:   Patient ID: Andrew [name], male    DOB: 1950/12/11, 66 y.o.   MRN: [id number] CC: Follow-up (3 month, Diabetes)  HPI: Andrew [name] is a 66 y.o. male presenting for Follow-up (3 month, Diabetes)  DM2: started back on invokana, running 125-145 on average Taking BGLs in the early morning, low 100s Taking '100mg'$     Tobacco use: hasnt started wellbutrin yet, doesn't feel ready to quit  Bladder cancer is back, has f/u with urology in Augsust  Has swelling in LE by the end of the day when he is on his feet a lot Usually on his feet a lot during the day  Has a skin tag at neck line that is often irritated  Soft mobile nodule L lateral back, not hurting   Not sleeping well at night, usually wakes up in the middle of the night Feeling tired during the day Has a bipap machine at home for sleep apnea that he got when in [place] to treat OSA, has not been using it for over a year  Tremor: med helping some Still sometimes has symptoms None today  Relevant past medical, surgical, family and social history reviewed. Allergies and medications reviewed and updated. History  Smoking Status  . Current Every Day Smoker  . Packs/day: 1.00  . Years: 42.00  . Types: Cigarettes  Smokeless Tobacco  . Never Used   ROS: Per HPI   Objective:    BP 119/70   Pulse 90   Temp 97.5 F (36.4 C) (Oral)   Ht '6\' 3"'$  (1.905 m)   Wt 217 lb (98.4 kg)   BMI 27.12 kg/m   Wt Readings from Last 3 Encounters:  10/06/16 217 lb (98.4 kg)  08/10/16 220 lb 8 oz (100 kg)  07/06/16 222 lb (100.7 kg)    Gen: NAD, alert, cooperative with exam, NCAT EYES: EOMI, no conjunctival injection, or no icterus CV: NRRR, normal S1/S2, no murmur, distal pulses 2+ b/l Resp: CTABL, no wheezes, normal WOB Abd: +BS, soft, NTND. no guarding or organomegaly Ext: No edema, warm Neuro: Alert and oriented, strength equal b/l UE and LE, coordination grossly normal MSK: normal muscle bulk Skin: R side of neck near  neck line with slightly irritated brown flat topped papule with 21m skin tag L lateral mid back with easily mobile apprx 1 cm soft nodule under the skin, no redness or tenderness  Assessment & Plan:  MThomwas seen today for follow-up.  Diagnoses and all orders for this visit:  Type 2 diabetes mellitus without complication, without long-term current use of insulin (HCC) Improved A1c to 7.4 Cont to decrease sugar intake, cont invokana -     Bayer DCA Hb A1c Waived -     Microalbumin / creatinine urine ratio -     BMP8+EGFR  Colon cancer screening -     Fecal occult blood, imunochemical; Future  Need for hepatitis C screening test -     Hepatitis C antibody  Sleep apnea, unspecified type Ongoing symptoms, isnt sure if bipap machine titrated correctly -     Ambulatory referral to Pulmonology  Skin tag Discussed options, will remove as below  Essential tremor Stable, cont med  Tobacco use disorder Pt going to start wellbutrin Open to quitting, has been hard  Skin tag removal: After risks, benefits and alternatives discussed with pt and pt decided to proceed, using scissors one R side of neck skin tag was removed. No bleeding following procedure. Pt tolerated procedure well.  Follow up plan: Return in about 3 months (around 01/06/2017). Assunta Found, MD [place]

## 2016-10-07 LAB — BMP8+EGFR
BUN / CREAT RATIO: 20 (ref 10–24)
BUN: 16 mg/dL (ref 8–27)
CO2: 23 mmol/L (ref 18–29)
CREATININE: 0.81 mg/dL (ref 0.76–1.27)
Calcium: 9.5 mg/dL (ref 8.6–10.2)
Chloride: 96 mmol/L (ref 96–106)
GFR calc Af Amer: 107 mL/min/{1.73_m2} (ref >59)
GFR, EST NON AFRICAN AMERICAN: 93 mL/min/{1.73_m2} (ref >59)
Glucose: 207 mg/dL — ABNORMAL HIGH (ref 65–99)
Potassium: 4.5 mmol/L (ref 3.5–5.2)
SODIUM: 138 mmol/L (ref 134–144)

## 2016-10-07 LAB — MICROALBUMIN / CREATININE URINE RATIO: CREATININE, UR: 9 mg/dL

## 2016-10-07 LAB — HEPATITIS C ANTIBODY: Hep C Virus Ab: <0.1 s/co ratio (ref 0.0–0.9)

## 2016-10-11 ENCOUNTER — Other Ambulatory Visit: Payer: Self-pay | Admitting: Pediatrics

## 2016-10-11 DIAGNOSIS — K219 Gastro-esophageal reflux disease without esophagitis: Secondary | ICD-10-CM

## 2016-10-11 DIAGNOSIS — G250 Essential tremor: Secondary | ICD-10-CM

## 2016-10-11 DIAGNOSIS — E11319 Type 2 diabetes mellitus with unspecified diabetic retinopathy without macular edema: Secondary | ICD-10-CM

## 2016-10-12 ENCOUNTER — Telehealth: Payer: Self-pay | Admitting: Pediatrics

## 2016-10-12 ENCOUNTER — Other Ambulatory Visit: Payer: Medicare Other

## 2016-10-12 DIAGNOSIS — Z1211 Encounter for screening for malignant neoplasm of colon: Secondary | ICD-10-CM | POA: Diagnosis not present

## 2016-10-13 LAB — FECAL OCCULT BLOOD, IMMUNOCHEMICAL: Fecal Occult Bld: NEGATIVE

## 2016-10-13 NOTE — Telephone Encounter (Signed)
done

## 2016-12-03 ENCOUNTER — Other Ambulatory Visit: Payer: Self-pay | Admitting: Pharmacist

## 2016-12-03 DIAGNOSIS — E119 Type 2 diabetes mellitus without complications: Secondary | ICD-10-CM

## 2016-12-10 ENCOUNTER — Other Ambulatory Visit: Payer: Self-pay | Admitting: Urology

## 2016-12-22 ENCOUNTER — Telehealth: Payer: Self-pay | Admitting: Pediatrics

## 2016-12-22 MED ORDER — SITAGLIPTIN PHOSPHATE 100 MG PO TABS: 100.0000 mg | ORAL_TABLET | Freq: Every day | ORAL | 2 refills | Status: DC

## 2016-12-22 NOTE — Telephone Encounter (Signed)
Patient states that the Anastasio Auerbach is too high and would like something else sent to the pharmacy. He has been off of it for 3 weeks and states his BS has been around the 300 range. States he is going out of town tonight until 2022-09-07 due to a death in the family. Would like a new rx sent to [facility]. Please advise.

## 2016-12-22 NOTE — Telephone Encounter (Signed)
I sent in [place] 100mg , start once daily in the morning Avoid sugary foods Keep taking metformin BID If BGLs stay high we will need to add another medication Follow up with me mid august as scheduled Keep checking BGLs every morning and before dinner

## 2016-12-22 NOTE — Telephone Encounter (Signed)
Patient aware and verbalizes understanding. 

## 2016-12-28 ENCOUNTER — Telehealth: Payer: Self-pay | Admitting: Pediatrics

## 2016-12-31 ENCOUNTER — Ambulatory Visit (INDEPENDENT_AMBULATORY_CARE_PROVIDER_SITE_OTHER): Payer: Medicare Other | Admitting: Urology

## 2016-12-31 ENCOUNTER — Institutional Professional Consult (permissible substitution): Payer: Medicare Other | Admitting: Pulmonary Disease

## 2016-12-31 DIAGNOSIS — C671 Malignant neoplasm of dome of bladder: Secondary | ICD-10-CM | POA: Diagnosis not present

## 2016-12-31 NOTE — Patient Instructions (Signed)
Andrew [name]  12/31/2016     @PREFPERIOPPHARMACY @   Your procedure is scheduled on  01/07/2017   Report to [facility] at  4  A.M.  Call this number if you have problems the morning of surgery:  [phone]   Remember:  Do not eat food or drink liquids after midnight.  Take these medicines the morning of surgery with A SIP OF WATER  Prilosec, primidone.   Do not wear jewelry, make-up or nail polish.  Do not wear lotions, powders, or perfumes, or deoderant.  Do not shave 48 hours prior to surgery.  Men may shave face and neck.  Do not bring valuables to the hospital.  [facility] is not responsible for any belongings or valuables.  Contacts, dentures or bridgework may not be worn into surgery.  Leave your suitcase in the car.  After surgery it may be brought to your room.  For patients admitted to the hospital, discharge time will be determined by your treatment team.  Patients discharged the day of surgery will not be allowed to drive home.   Name and phone number of your driver:   family Special instructions:  None  Please read over the following fact sheets that you were given. Anesthesia Post-op Instructions and Care and Recovery After Surgery       Transurethral Resection of Bladder Tumor Transurethral resection of a bladder tumor is the removal (resection) of a cancerous growth (tumor) on the inside wall of the bladder. The bladder is the organ that holds urine. The tumor is removed through the tube that carries urine out of the body (urethra). In a transurethral resection, a thin telescope with a light, a tiny camera, and an electric cutting edge (resectoscope) is passed through the urethra. In men, the opening of the urethra is at the end of the penis. In women, it is just above the vaginal opening. Tell a health care provider about:  Any allergies you have.  All medicines you are taking, including vitamins, herbs, eye drops, creams,  and over-the-counter medicines.  Any problems you or family members have had with anesthetic medicines.  Any blood disorders you have.  Any surgeries you have had.  Any medical conditions you have.  Any recent urinary tract infections you have had.  Whether you are pregnant or may be pregnant. What are the risks? Generally, this is a safe procedure. However, problems may occur, including:  Infection.  Bleeding.  Allergic reactions to medicines.  Damage to other structures or organs, such as: ? The urethra. ? The tubes that drain urine from the kidneys into the bladder (ureters).  Pain and burning during urination.  Difficulty urinating due to partial blockage of the urethra.  Inability to urinate (urinary retention).  What happens before the procedure?  Follow instructions from your health care provider about eating and drinking restrictions.  Ask your health care provider about: ? Changing or stopping your regular medicines. This is especially important if you are taking diabetes medicines or blood thinners. ? Taking medicines such as aspirin and ibuprofen. These medicines can thin your blood. Do not take these medicines before your procedure if your health care provider instructs you not to.  You may have a physical exam.  You may have tests, including: ? Blood tests. ? Urine tests. ? Electrocardiogram (ECG). This test measures the electrical activity of the heart.  You may be given antibiotic  medicine to help prevent infection.  Ask your health care provider how your surgical site will be marked or identified.  Plan to have someone take you home after the procedure. What happens during the procedure?  To reduce your risk of infection: ? Your health care team will wash or sanitize their hands. ? Your skin will be washed with soap.  An IV tube will be inserted into one of your veins.  You will be given one or more of the following: ? A medicine to help  you relax (sedative). ? A medicine to make you fall asleep (general anesthetic). ? A medicine that is injected into your spine to numb the area below and slightly above the injection site (spinal anesthetic).  Your legs will be placed in foot rests (stirrups) so that your legs are apart and your knees are bent.  The resectoscope will be passed through your urethra and into your bladder.  The part of your bladder that is affected by the tumor will be resected using the cutting edge of the resectoscope.  The resectoscope will be removed.  A thin, flexible tube (catheter) will be passed through your urethra and into your bladder. The catheter will drain urine into a bag outside of your body. ? Fluid may be passed through the catheter to keep the catheter open. The procedure may vary among health care providers and hospitals. What happens after the procedure?  Your blood pressure, heart rate, breathing rate, and blood oxygen level will be monitored often until the medicines you were given have worn off.  You may continue to receive fluids and medicines through an IV tube.  You will have some pain. Pain medicine will be available to help you.  You will have a catheter draining your urine. ? You will have blood in your urine. Your catheter may be kept in until your urine is clear. ? Your urinary drainage will be monitored. If necessary, your bladder may be rinsed out (irrigated) by passing fluid through your catheter.  You will be encouraged to walk around as soon as possible.  You may have to wear compression stockings. These stockings help to prevent blood clots and reduce swelling in your legs.  Do not drive for 24 hours if you received a sedative. This information is not intended to replace advice given to you by your health care provider. Make sure you discuss any questions you have with your health care provider. Document Released: 03/06/2009 Document Revised: 01/11/2016 Document  Reviewed: 01/30/2015 Elsevier Interactive Patient Education  2018 [place].  Transurethral Resection of Bladder Tumor, Care After Refer to this sheet in the next few weeks. These instructions provide you with information about caring for yourself after your procedure. Your health care provider may also give you more specific instructions. Your treatment has been planned according to current medical practices, but problems sometimes occur. Call your health care provider if you have any problems or questions after your procedure. What can I expect after the procedure? After the procedure, it is common to have:  A small amount of blood in your urine for up to 2 weeks.  Soreness or mild discomfort from your catheter. After your catheter is removed, you may have mild soreness, especially when urinating.  Pain in your lower abdomen.  Follow these instructions at home:  Medicines  Take over-the-counter and prescription medicines only as told by your health care provider.  Do not drive or operate heavy machinery while taking prescription pain medicine.  Do not drive for 24 hours if you received a sedative.  If you were prescribed antibiotic medicine, take it as told by your health care provider. Do not stop taking the antibiotic even if you start to feel better. Activity  Return to your normal activities as told by your health care provider. Ask your health care provider what activities are safe for you.  Do not lift anything that is heavier than 10 lb (4.5 kg) for as long as told by your health care provider.  Avoid intense physical activity for as long as told by your health care provider.  Walk at least one time every day. This helps to prevent blood clots. You may increase your physical activity gradually as you start to feel better. General instructions  Do not drink alcohol for as long as told by your health care provider. This is especially important if you are taking  prescription pain medicines.  Do not take baths, swim, or use a hot tub until your health care provider approves.  If you have a catheter, follow instructions from your health care provider about caring for your catheter and your drainage bag.  Drink enough fluid to keep your urine clear or pale yellow.  Wear compression stockings as told by your health care provider. These stockings help to prevent blood clots and reduce swelling in your legs.  Keep all follow-up visits as told by your health care provider. This is important. Contact a health care provider if:  You have pain that gets worse or does not improve with medicine.  You have blood in your urine for more than 2 weeks.  You have cloudy or bad-smelling urine.  You become constipated. Signs of constipation may include having: ? Fewer than three bowel movements in a week. ? Difficulty having a bowel movement. ? Stools that are dry, hard, or larger than normal.  You have a fever. Get help right away if:  You have: ? Severe pain. ? Bright-red blood in your urine. ? Blood clots in your urine. ? A lot of blood in your urine.  Your catheter has been removed and you are not able to urinate.  You have a catheter in place and the catheter is not draining urine. This information is not intended to replace advice given to you by your health care provider. Make sure you discuss any questions you have with your health care provider. Document Released: 04/21/2015 Document Revised: 01/11/2016 Document Reviewed: 01/30/2015 Elsevier Interactive Patient Education  2018 [employer].  Cystoscopy Cystoscopy is a procedure that is used to help diagnose and sometimes treat conditions that affect that lower urinary tract. The lower urinary tract includes the bladder and the tube that drains urine from the bladder out of the body (urethra). Cystoscopy is performed with a thin, tube-shaped instrument with a light and camera at the end  (cystoscope). The cystoscope may be hard (rigid) or flexible, depending on the goal of the procedure.The cystoscope is inserted through the urethra, into the bladder. Cystoscopy may be recommended if you have:  Urinary tractinfections that keep coming back (recurring).  Blood in the urine (hematuria).  Loss of bladder control (urinary incontinence) or an overactive bladder.  Unusual cells found in a urine sample.  A blockage in the urethra.  Painful urination.  An abnormality in the bladder found during an intravenous pyelogram (IVP) or CT scan.  Cystoscopy may also be done to remove a sample of tissue to be examined under a microscope (biopsy). Tell  a health care provider about:  Any allergies you have.  All medicines you are taking, including vitamins, herbs, eye drops, creams, and over-the-counter medicines.  Any problems you or family members have had with anesthetic medicines.  Any blood disorders you have.  Any surgeries you have had.  Any medical conditions you have.  Whether you are pregnant or may be pregnant. What are the risks? Generally, this is a safe procedure. However, problems may occur, including:  Infection.  Bleeding.  Allergic reactions to medicines.  Damage to other structures or organs.  What happens before the procedure?  Ask your health care provider about: ? Changing or stopping your regular medicines. This is especially important if you are taking diabetes medicines or blood thinners. ? Taking medicines such as aspirin and ibuprofen. These medicines can thin your blood. Do not take these medicines before your procedure if your health care provider instructs you not to.  Follow instructions from your health care provider about eating or drinking restrictions.  You may be given antibiotic medicine to help prevent infection.  You may have an exam or testing, such as X-rays of the bladder, urethra, or kidneys.  You may have urine tests to  check for signs of infection.  Plan to have someone take you home after the procedure. What happens during the procedure?  To reduce your risk of infection,your health care team will wash or sanitize their hands.  You will be given one or more of the following: ? A medicine to help you relax (sedative). ? A medicine to numb the area (local anesthetic).  The area around the opening of your urethra will be cleaned.  The cystoscope will be passed through your urethra into your bladder.  Germ-free (sterile)fluid will flow through the cystoscope to fill your bladder. The fluid will stretch your bladder so that your surgeon can clearly examine your bladder walls.  The cystoscope will be removed and your bladder will be emptied. The procedure may vary among health care providers and hospitals. What happens after the procedure?  You may have some soreness or pain in your abdomen and urethra. Medicines will be available to help you.  You may have some blood in your urine.  Do not drive for 24 hours if you received a sedative. This information is not intended to replace advice given to you by your health care provider. Make sure you discuss any questions you have with your health care provider. Document Released: 05/07/2000 Document Revised: 09/18/2015 Document Reviewed: 03/27/2015 Elsevier Interactive Patient Education  2017 [place].  Cystoscopy, Care After Refer to this sheet in the next few weeks. These instructions provide you with information about caring for yourself after your procedure. Your health care provider may also give you more specific instructions. Your treatment has been planned according to current medical practices, but problems sometimes occur. Call your health care provider if you have any problems or questions after your procedure. What can I expect after the procedure? After the procedure, it is common to have:  Mild pain when you urinate. Pain should stop  within a few minutes after you urinate. This may last for up to 1 week.  A small amount of blood in your urine for several days.  Feeling like you need to urinate but producing only a small amount of urine.  Follow these instructions at home:  Medicines  Take over-the-counter and prescription medicines only as told by your health care provider.  If you were prescribed an  antibiotic medicine, take it as told by your health care provider. Do not stop taking the antibiotic even if you start to feel better. General instructions   Return to your normal activities as told by your health care provider. Ask your health care provider what activities are safe for you.  Do not drive for 24 hours if you received a sedative.  Watch for any blood in your urine. If the amount of blood in your urine increases, call your health care provider.  Follow instructions from your health care provider about eating or drinking restrictions.  If a tissue sample was removed for testing (biopsy) during your procedure, it is your responsibility to get your test results. Ask your health care provider or the department performing the test when your results will be ready.  Drink enough fluid to keep your urine clear or pale yellow.  Keep all follow-up visits as told by your health care provider. This is important. Contact a health care provider if:  You have pain that gets worse or does not get better with medicine, especially pain when you urinate.  You have difficulty urinating. Get help right away if:  You have more blood in your urine.  You have blood clots in your urine.  You have abdominal pain.  You have a fever or chills.  You are unable to urinate. This information is not intended to replace advice given to you by your health care provider. Make sure you discuss any questions you have with your health care provider. Document Released: 11/27/2004 Document Revised: 10/16/2015 Document Reviewed:  03/27/2015 Elsevier Interactive Patient Education  2017 [place] Anesthesia, Adult General anesthesia is the use of medicines to make a person "go to sleep" (be unconscious) for a medical procedure. General anesthesia is often recommended when a procedure:  Is long.  Requires you to be still or in an unusual position.  Is major and can cause you to lose blood.  Is impossible to do without general anesthesia.  The medicines used for general anesthesia are called general anesthetics. In addition to making you sleep, the medicines:  Prevent pain.  Control your blood pressure.  Relax your muscles.  Tell a health care provider about:  Any allergies you have.  All medicines you are taking, including vitamins, herbs, eye drops, creams, and over-the-counter medicines.  Any problems you or family members have had with anesthetic medicines.  Types of anesthetics you have had in the past.  Any bleeding disorders you have.  Any surgeries you have had.  Any medical conditions you have.  Any history of heart or lung conditions, such as heart failure, sleep apnea, or chronic obstructive pulmonary disease (COPD).  Whether you are pregnant or may be pregnant.  Whether you use tobacco, alcohol, marijuana, or street drugs.  Any history of Armed forces logistics/support/administrative officer.  Any history of depression or anxiety. What are the risks? Generally, this is a safe procedure. However, problems may occur, including:  Allergic reaction to anesthetics.  Lung and heart problems.  Inhaling food or liquids from your stomach into your lungs (aspiration).  Injury to nerves.  Waking up during your procedure and being unable to move (rare).  Extreme agitation or a state of mental confusion (delirium) when you wake up from the anesthetic.  Air in the bloodstream, which can lead to stroke.  These problems are more likely to develop if you are having a major surgery or if you have an advanced  medical condition. You can  prevent some of these complications by answering all of your health care provider's questions thoroughly and by following all pre-procedure instructions. General anesthesia can cause side effects, including:  Nausea or vomiting  A sore throat from the breathing tube.  Feeling cold or shivery.  Feeling tired, washed out, or achy.  Sleepiness or drowsiness.  Confusion or agitation.  What happens before the procedure? Staying hydrated Follow instructions from your health care provider about hydration, which may include:  Up to 2 hours before the procedure - you may continue to drink clear liquids, such as water, clear fruit juice, black coffee, and plain tea.  Eating and drinking restrictions Follow instructions from your health care provider about eating and drinking, which may include:  8 hours before the procedure - stop eating heavy meals or foods such as meat, fried foods, or fatty foods.  6 hours before the procedure - stop eating light meals or foods, such as toast or cereal.  6 hours before the procedure - stop drinking milk or drinks that contain milk.  2 hours before the procedure - stop drinking clear liquids.  Medicines  Ask your health care provider about: ? Changing or stopping your regular medicines. This is especially important if you are taking diabetes medicines or blood thinners. ? Taking medicines such as aspirin and ibuprofen. These medicines can thin your blood. Do not take these medicines before your procedure if your health care provider instructs you not to. ? Taking new dietary supplements or medicines. Do not take these during the week before your procedure unless your health care provider approves them.  If you are told to take a medicine or to continue taking a medicine on the day of the procedure, take the medicine with sips of water. General instructions   Ask if you will be going home the same day, the following day,  or after a longer hospital stay. ? Plan to have someone take you home. ? Plan to have someone stay with you for the first 24 hours after you leave the hospital or clinic.  For 3-6 weeks before the procedure, try not to use any tobacco products, such as cigarettes, chewing tobacco, and e-cigarettes.  You may brush your teeth on the morning of the procedure, but make sure to spit out the toothpaste. What happens during the procedure?  You will be given anesthetics through a mask and through an IV tube in one of your veins.  You may receive medicine to help you relax (sedative).  As soon as you are asleep, a breathing tube may be used to help you breathe.  An anesthesia specialist will stay with you throughout the procedure. He or she will help keep you comfortable and safe by continuing to give you medicines and adjusting the amount of medicine that you get. He or she will also watch your blood pressure, pulse, and oxygen levels to make sure that the anesthetics do not cause any problems.  If a breathing tube was used to help you breathe, it will be removed before you wake up. The procedure may vary among health care providers and hospitals. What happens after the procedure?  You will wake up, often slowly, after the procedure is complete, usually in a recovery area.  Your blood pressure, heart rate, breathing rate, and blood oxygen level will be monitored until the medicines you were given have worn off.  You may be given medicine to help you calm down if you feel anxious or agitated.  If you will be going home the same day, your health care provider may check to make sure you can stand, drink, and urinate.  Your health care providers will treat your pain and side effects before you go home.  Do not drive for 24 hours if you received a sedative.  You may: ? Feel nauseous and vomit. ? Have a sore throat. ? Have mental slowness. ? Feel cold or shivery. ? Feel sleepy. ? Feel  tired. ? Feel sore or achy, even in parts of your body where you did not have surgery. This information is not intended to replace advice given to you by your health care provider. Make sure you discuss any questions you have with your health care provider. Document Released: 08/17/2007 Document Revised: 10/21/2015 Document Reviewed: 04/24/2015 Elsevier Interactive Patient Education  2018 [place] Anesthesia, Adult, Care After These instructions provide you with information about caring for yourself after your procedure. Your health care provider may also give you more specific instructions. Your treatment has been planned according to current medical practices, but problems sometimes occur. Call your health care provider if you have any problems or questions after your procedure. What can I expect after the procedure? After the procedure, it is common to have:  Vomiting.  A sore throat.  Mental slowness.  It is common to feel:  Nauseous.  Cold or shivery.  Sleepy.  Tired.  Sore or achy, even in parts of your body where you did not have surgery.  Follow these instructions at home: For at least 24 hours after the procedure:  Do not: ? Participate in activities where you could fall or become injured. ? Drive. ? Use heavy machinery. ? Drink alcohol. ? Take sleeping pills or medicines that cause drowsiness. ? Make important decisions or sign legal documents. ? Take care of children on your own.  Rest. Eating and drinking  If you vomit, drink water, juice, or soup when you can drink without vomiting.  Drink enough fluid to keep your urine clear or pale yellow.  Make sure you have little or no nausea before eating solid foods.  Follow the diet recommended by your health care provider. General instructions  Have a responsible adult stay with you until you are awake and alert.  Return to your normal activities as told by your health care provider. Ask your  health care provider what activities are safe for you.  Take over-the-counter and prescription medicines only as told by your health care provider.  If you smoke, do not smoke without supervision.  Keep all follow-up visits as told by your health care provider. This is important. Contact a health care provider if:  You continue to have nausea or vomiting at home, and medicines are not helpful.  You cannot drink fluids or start eating again.  You cannot urinate after 8-12 hours.  You develop a skin rash.  You have fever.  You have increasing redness at the site of your procedure. Get help right away if:  You have difficulty breathing.  You have chest pain.  You have unexpected bleeding.  You feel that you are having a life-threatening or urgent problem. This information is not intended to replace advice given to you by your health care provider. Make sure you discuss any questions you have with your health care provider. Document Released: 08/16/2000 Document Revised: 10/13/2015 Document Reviewed: 04/24/2015 Elsevier Interactive Patient Education  [employer].

## 2017-01-03 ENCOUNTER — Ambulatory Visit (INDEPENDENT_AMBULATORY_CARE_PROVIDER_SITE_OTHER): Payer: Medicare Other | Admitting: Pediatrics

## 2017-01-03 ENCOUNTER — Encounter: Payer: Self-pay | Admitting: Pediatrics

## 2017-01-03 ENCOUNTER — Encounter (HOSPITAL_COMMUNITY): Payer: Self-pay

## 2017-01-03 ENCOUNTER — Encounter (HOSPITAL_COMMUNITY)
Admission: RE | Admit: 2017-01-03 | Discharge: 2017-01-03 | Disposition: A | Discharge status: Home / Self Care | Payer: Medicare Other | Source: Ambulatory Visit | Attending: Urology | Admitting: Urology

## 2017-01-03 ENCOUNTER — Other Ambulatory Visit: Payer: Self-pay

## 2017-01-03 VITALS — BP 136/82 | HR 92 | Temp 97.4°F | Ht 75.0 in | Wt 223.0 lb

## 2017-01-03 DIAGNOSIS — E785 Hyperlipidemia, unspecified: Secondary | ICD-10-CM

## 2017-01-03 DIAGNOSIS — E119 Type 2 diabetes mellitus without complications: Secondary | ICD-10-CM | POA: Diagnosis not present

## 2017-01-03 DIAGNOSIS — R252 Cramp and spasm: Secondary | ICD-10-CM

## 2017-01-03 DIAGNOSIS — Z01812 Encounter for preprocedural laboratory examination: Secondary | ICD-10-CM | POA: Diagnosis not present

## 2017-01-03 DIAGNOSIS — G250 Essential tremor: Secondary | ICD-10-CM | POA: Diagnosis not present

## 2017-01-03 DIAGNOSIS — K219 Gastro-esophageal reflux disease without esophagitis: Secondary | ICD-10-CM | POA: Diagnosis not present

## 2017-01-03 DIAGNOSIS — Z01818 Encounter for other preprocedural examination: Secondary | ICD-10-CM | POA: Insufficient documentation

## 2017-01-03 DIAGNOSIS — C679 Malignant neoplasm of bladder, unspecified: Secondary | ICD-10-CM | POA: Diagnosis not present

## 2017-01-03 LAB — BASIC METABOLIC PANEL
Anion gap: 11 (ref 5–15)
BUN / CREAT RATIO: 19 (ref 10–24)
BUN: 13 mg/dL (ref 8–27)
BUN: 14 mg/dL (ref 6–20)
CALCIUM: 9.8 mg/dL (ref 8.9–10.3)
CO2: 21 mmol/L (ref 20–29)
CO2: 24 mmol/L (ref 22–32)
CREATININE: 0.7 mg/dL — AB (ref 0.76–1.27)
Calcium: 9.5 mg/dL (ref 8.6–10.2)
Chloride: 99 mmol/L (ref 96–106)
Chloride: 102 mmol/L (ref 101–111)
Creatinine, Ser: 0.71 mg/dL (ref 0.61–1.24)
GFR calc Af Amer: >60 mL/min (ref >60)
GFR calc non Af Amer: 98 mL/min/{1.73_m2} (ref >59)
GFR, EST AFRICAN AMERICAN: 114 mL/min/{1.73_m2} (ref >59)
GLUCOSE: 254 mg/dL — AB (ref 65–99)
Glucose: 243 mg/dL — ABNORMAL HIGH (ref 65–99)
POTASSIUM: 4 mmol/L (ref 3.5–5.1)
Potassium: 4.6 mmol/L (ref 3.5–5.2)
Sodium: 137 mmol/L (ref 134–144)
Sodium: 137 mmol/L (ref 135–145)

## 2017-01-03 LAB — CBC WITH DIFFERENTIAL/PLATELET
Basophils Absolute: 0.1 10*3/uL (ref 0.0–0.1)
Basophils Relative: 1 %
EOS PCT: 3 %
Eosinophils Absolute: 0.3 10*3/uL (ref 0.0–0.7)
HEMATOCRIT: 49.1 % (ref 39.0–52.0)
Hemoglobin: 17.2 g/dL — ABNORMAL HIGH (ref 13.0–17.0)
LYMPHS ABS: 2.7 10*3/uL (ref 0.7–4.0)
LYMPHS PCT: 29 %
MCH: 32.2 pg (ref 26.0–34.0)
MCHC: 35 g/dL (ref 30.0–36.0)
MCV: 91.9 fL (ref 78.0–100.0)
MONO ABS: 0.8 10*3/uL (ref 0.1–1.0)
Monocytes Relative: 9 %
Neutro Abs: 5.4 10*3/uL (ref 1.7–7.7)
Neutrophils Relative %: 58 %
PLATELETS: 204 10*3/uL (ref 150–400)
RBC: 5.34 MIL/uL (ref 4.22–5.81)
RDW: 12.9 % (ref 11.5–15.5)
WBC: 9.3 10*3/uL (ref 4.0–10.5)

## 2017-01-03 LAB — BAYER DCA HB A1C WAIVED: HB A1C (BAYER DCA - WAIVED): 9.3 % — ABNORMAL HIGH (ref <7.0)

## 2017-01-03 MED ORDER — PRAVASTATIN SODIUM 80 MG PO TABS: 80.0000 mg | ORAL_TABLET | Freq: Every day | ORAL | 3 refills | Status: DC

## 2017-01-03 MED ORDER — OMEPRAZOLE 20 MG PO CPDR: DELAYED_RELEASE_CAPSULE | ORAL | 1 refills | Status: DC

## 2017-01-03 MED ORDER — SITAGLIPTIN PHOSPHATE 100 MG PO TABS: 100.0000 mg | ORAL_TABLET | Freq: Every day | ORAL | 2 refills | Status: DC

## 2017-01-03 MED ORDER — DAPAGLIFLOZIN PROPANEDIOL 5 MG PO TABS: 10.0000 mg | ORAL_TABLET | Freq: Every day | ORAL | 0 refills | Status: DC

## 2017-01-03 MED ORDER — GLIMEPIRIDE 4 MG PO TABS: ORAL_TABLET | ORAL | 1 refills | Status: DC

## 2017-01-03 MED ORDER — DAPAGLIFLOZIN PROPANEDIOL 5 MG PO TABS: 5.0000 mg | ORAL_TABLET | Freq: Every day | ORAL | 3 refills | Status: DC

## 2017-01-03 MED ORDER — METFORMIN HCL 1000 MG PO TABS: 1000.0000 mg | ORAL_TABLET | Freq: Two times a day (BID) | ORAL | 3 refills | Status: DC

## 2017-01-03 MED ORDER — PRIMIDONE 50 MG PO TABS: 50.0000 mg | ORAL_TABLET | Freq: Every day | ORAL | 1 refills | Status: DC

## 2017-01-03 NOTE — Progress Notes (Signed)
Subjective:   Patient ID: Andrew [name], male    DOB: 09/30/1950, 66 y.o.   MRN: [id number] CC: Follow-up (6 month) med problems HPI: Andrew [name] is a 66 y.o. male presenting for Follow-up (6 month)  DM2: BGLs up and down Taking 1000mg  metformin BID and sometimes half a pill at night when BGLs are high Has been out of invokana for past month Now in donut hole On januvia for the past couple of weeks BGLs 200s in the morning  HTN: no CP, no HA Takes meds regularly  continues ot smoke hasnt started wellbutrin yet, wants to be more ready to quit when he starts  Continues to stay active  Tremors slightly improved with primidone, continues to have symptoms some days  Relevant past medical, surgical, family and social history reviewed. Allergies and medications reviewed and updated. History  Smoking Status  . Current Every Day Smoker  . Packs/day: 1.00  . Years: 42.00  . Types: Cigarettes  Smokeless Tobacco  . Never Used   ROS: Per HPI   Objective:    BP 136/82   Pulse 92   Temp (!) 97.4 F (36.3 C) (Oral)   Ht 6\' 3"  (1.905 m)   Wt 223 lb (101.2 kg)   BMI 27.87 kg/m   Wt Readings from Last 3 Encounters:  01/03/17 222 lb (100.7 kg)  01/03/17 223 lb (101.2 kg)  10/06/16 217 lb (98.4 kg)    Gen: NAD, alert, cooperative with exam, NCAT EYES: EOMI, no conjunctival injection, or no icterus CV: NRRR, normal S1/S2, no murmur, distal pulses 2+ b/l Resp: CTABL, no wheezes, normal WOB Abd: +BS, soft, NTND. no guarding or organomegaly Ext: trace edema L ankle, warm Neuro: Alert and oriented, strength equal b/l UE and LE, coordination grossly normal MSK: normal muscle bulk  Assessment & Plan:  Andrew [name] was seen today for follow-up multiple med problems.  Diagnoses and all orders for this visit:  Type 2 diabetes mellitus without complication, without long-term current use of insulin (HCC) BGLs elevated since off of invokana due to expense Now in donut hole A1c from 7.4 to  9.3 Taking [place] starting 3 weeks ago, continues on metformin and glimeperide Samples of farxiga and [place] given today Will send in new Rx for both to pharmacy Referral to [facility] for med assistance -     Bayer DCA Hb A1c Waived -     AMB Referral to [place] Management -     glimepiride (AMARYL) 4 MG tablet; TAKE 2 TABLETS ONCE DAILY WITH BREAKFAST -     metFORMIN (GLUCOPHAGE) 1000 MG tablet; Take 1 tablet (1,000 mg total) by mouth 2 (two) times daily with a meal. -     sitaGLIPtin (JANUVIA) 100 MG tablet; Take 1 tablet (100 mg total) by mouth daily. -     dapagliflozin propanediol (FARXIGA) 5 MG TABS tablet; Take 10 mg by mouth daily.  Gastroesophageal reflux disease, esophagitis presence not specified Stable on below, cont -     omeprazole (PRILOSEC) 20 MG capsule; TAKE (1) CAPSULE DAILY  HLD: Stable on below, cont -     pravastatin (PRAVACHOL) 80 MG tablet; Take 1 tablet (80 mg total) by mouth daily.  Essential tremor Stable, some days with more symptoms Declines neurology referral for now -     primidone (MYSOLINE) 50 MG tablet; Take 1 tablet (50 mg total) by mouth daily.  Leg cramp -     Basic Metabolic Panel   Follow up plan: Return in about  3 months (around 04/05/2017). Assunta Found, MD [place]

## 2017-01-04 ENCOUNTER — Institutional Professional Consult (permissible substitution): Payer: Medicare Other | Admitting: Internal Medicine

## 2017-01-07 ENCOUNTER — Other Ambulatory Visit: Payer: Self-pay | Admitting: *Deleted

## 2017-01-07 ENCOUNTER — Encounter (HOSPITAL_COMMUNITY): Payer: Self-pay | Admitting: *Deleted

## 2017-01-07 ENCOUNTER — Ambulatory Visit (HOSPITAL_COMMUNITY)
Admission: RE | Admit: 2017-01-07 | Discharge: 2017-01-07 | Disposition: A | Discharge status: Home / Self Care | Payer: Medicare Other | Source: Ambulatory Visit | Attending: Urology | Admitting: Urology

## 2017-01-07 ENCOUNTER — Ambulatory Visit (HOSPITAL_COMMUNITY): Payer: Medicare Other | Admitting: Anesthesiology

## 2017-01-07 ENCOUNTER — Ambulatory Visit (HOSPITAL_COMMUNITY): Payer: Medicare Other

## 2017-01-07 ENCOUNTER — Encounter (HOSPITAL_COMMUNITY)
Admission: RE | Disposition: A | Discharge status: Home / Self Care | Payer: Self-pay | Source: Ambulatory Visit | Attending: Urology

## 2017-01-07 DIAGNOSIS — N400 Enlarged prostate without lower urinary tract symptoms: Secondary | ICD-10-CM | POA: Insufficient documentation

## 2017-01-07 DIAGNOSIS — Z7984 Long term (current) use of oral hypoglycemic drugs: Secondary | ICD-10-CM | POA: Diagnosis not present

## 2017-01-07 DIAGNOSIS — N323 Diverticulum of bladder: Secondary | ICD-10-CM | POA: Diagnosis not present

## 2017-01-07 DIAGNOSIS — I10 Essential (primary) hypertension: Secondary | ICD-10-CM | POA: Insufficient documentation

## 2017-01-07 DIAGNOSIS — C679 Malignant neoplasm of bladder, unspecified: Secondary | ICD-10-CM | POA: Diagnosis not present

## 2017-01-07 DIAGNOSIS — Z888 Allergy status to other drugs, medicaments and biological substances status: Secondary | ICD-10-CM | POA: Diagnosis not present

## 2017-01-07 DIAGNOSIS — Z8673 Personal history of transient ischemic attack (TIA), and cerebral infarction without residual deficits: Secondary | ICD-10-CM | POA: Insufficient documentation

## 2017-01-07 DIAGNOSIS — G4730 Sleep apnea, unspecified: Secondary | ICD-10-CM | POA: Insufficient documentation

## 2017-01-07 DIAGNOSIS — C671 Malignant neoplasm of dome of bladder: Secondary | ICD-10-CM | POA: Diagnosis not present

## 2017-01-07 DIAGNOSIS — Z79899 Other long term (current) drug therapy: Secondary | ICD-10-CM | POA: Diagnosis not present

## 2017-01-07 DIAGNOSIS — E119 Type 2 diabetes mellitus without complications: Secondary | ICD-10-CM | POA: Insufficient documentation

## 2017-01-07 DIAGNOSIS — K219 Gastro-esophageal reflux disease without esophagitis: Secondary | ICD-10-CM | POA: Diagnosis not present

## 2017-01-07 DIAGNOSIS — D494 Neoplasm of unspecified behavior of bladder: Secondary | ICD-10-CM | POA: Diagnosis not present

## 2017-01-07 DIAGNOSIS — N3289 Other specified disorders of bladder: Secondary | ICD-10-CM | POA: Diagnosis not present

## 2017-01-07 DIAGNOSIS — N2889 Other specified disorders of kidney and ureter: Secondary | ICD-10-CM | POA: Diagnosis not present

## 2017-01-07 DIAGNOSIS — F17200 Nicotine dependence, unspecified, uncomplicated: Secondary | ICD-10-CM | POA: Insufficient documentation

## 2017-01-07 DIAGNOSIS — E7800 Pure hypercholesterolemia, unspecified: Secondary | ICD-10-CM | POA: Diagnosis not present

## 2017-01-07 HISTORY — PX: CYSTOSCOPY W/ RETROGRADES: SHX1426

## 2017-01-07 HISTORY — PX: TRANSURETHRAL RESECTION OF BLADDER TUMOR: SHX2575

## 2017-01-07 LAB — GLUCOSE, CAPILLARY
GLUCOSE-CAPILLARY: 210 mg/dL — AB (ref 65–99)
Glucose-Capillary: 206 mg/dL — ABNORMAL HIGH (ref 65–99)

## 2017-01-07 SURGERY — CYSTOSCOPY, WITH RETROGRADE PYELOGRAM
Anesthesia: General | Site: Ureter

## 2017-01-07 MED ORDER — PHENYLEPHRINE 40 MCG/ML (10ML) SYRINGE FOR IV PUSH (FOR BLOOD PRESSURE SUPPORT)
PREFILLED_SYRINGE | INTRAVENOUS | Status: AC
  Filled 2017-01-07: qty 10

## 2017-01-07 MED ORDER — SODIUM CHLORIDE 0.9 % IV SOLN: 250.0000 mL | INTRAVENOUS | Status: DC | PRN

## 2017-01-07 MED ORDER — MORPHINE SULFATE (PF) 2 MG/ML IV SOLN: 2.0000 mg | INTRAVENOUS | Status: DC | PRN

## 2017-01-07 MED ORDER — TRAMADOL HCL 50 MG PO TABS: 50.0000 mg | ORAL_TABLET | Freq: Four times a day (QID) | ORAL | 0 refills | Status: DC | PRN

## 2017-01-07 MED ORDER — LIDOCAINE HCL (CARDIAC) 20 MG/ML IV SOLN
INTRAVENOUS | Status: DC | PRN
  Administered 2017-01-07: 40 mg via INTRAVENOUS

## 2017-01-07 MED ORDER — ACETAMINOPHEN 325 MG PO TABS: 650.0000 mg | ORAL_TABLET | ORAL | Status: DC | PRN

## 2017-01-07 MED ORDER — STERILE WATER FOR IRRIGATION IR SOLN
Status: DC | PRN
  Administered 2017-01-07: 3000 mL

## 2017-01-07 MED ORDER — SODIUM CHLORIDE 0.9 % IJ SOLN
INTRAMUSCULAR | Status: AC
Start: 2017-01-07 — End: ?
  Filled 2017-01-07: qty 10

## 2017-01-07 MED ORDER — LACTATED RINGERS IV SOLN
INTRAVENOUS | Status: DC
  Administered 2017-01-07: 11:00:00 via INTRAVENOUS

## 2017-01-07 MED ORDER — FENTANYL CITRATE (PF) 250 MCG/5ML IJ SOLN
INTRAMUSCULAR | Status: AC
  Filled 2017-01-07: qty 5

## 2017-01-07 MED ORDER — SODIUM CHLORIDE 0.9% FLUSH: 3.0000 mL | Freq: Two times a day (BID) | INTRAVENOUS | Status: DC

## 2017-01-07 MED ORDER — OXYCODONE HCL 5 MG PO TABS
5.0000 mg | ORAL_TABLET | ORAL | Status: DC | PRN
  Administered 2017-01-07: 5 mg via ORAL
  Filled 2017-01-07: qty 1

## 2017-01-07 MED ORDER — DIATRIZOATE MEGLUMINE 30 % UR SOLN
URETHRAL | Status: AC
  Filled 2017-01-07: qty 300

## 2017-01-07 MED ORDER — LIDOCAINE HCL (PF) 1 % IJ SOLN
INTRAMUSCULAR | Status: AC
  Filled 2017-01-07: qty 5

## 2017-01-07 MED ORDER — MIDAZOLAM HCL 2 MG/2ML IJ SOLN
INTRAMUSCULAR | Status: AC
  Filled 2017-01-07: qty 2

## 2017-01-07 MED ORDER — IPRATROPIUM-ALBUTEROL 0.5-2.5 (3) MG/3ML IN SOLN
3.0000 mL | Freq: Once | RESPIRATORY_TRACT | Status: AC
  Administered 2017-01-07: 3 mL via RESPIRATORY_TRACT

## 2017-01-07 MED ORDER — MIDAZOLAM HCL 2 MG/2ML IJ SOLN
1.0000 mg | INTRAMUSCULAR | Status: AC
  Administered 2017-01-07: 2 mg via INTRAVENOUS

## 2017-01-07 MED ORDER — IPRATROPIUM-ALBUTEROL 0.5-2.5 (3) MG/3ML IN SOLN
RESPIRATORY_TRACT | Status: AC
Start: 2017-01-07 — End: ?
  Filled 2017-01-07: qty 3

## 2017-01-07 MED ORDER — CEFAZOLIN SODIUM-DEXTROSE 2-4 GM/100ML-% IV SOLN
INTRAVENOUS | Status: AC
  Filled 2017-01-07: qty 100

## 2017-01-07 MED ORDER — SODIUM CHLORIDE 0.9 % IR SOLN
Status: DC | PRN
  Administered 2017-01-07: 3000 mL

## 2017-01-07 MED ORDER — EPHEDRINE SULFATE 50 MG/ML IJ SOLN
INTRAMUSCULAR | Status: AC
  Filled 2017-01-07: qty 1

## 2017-01-07 MED ORDER — DIATRIZOATE MEGLUMINE 30 % UR SOLN
URETHRAL | Status: DC | PRN
  Administered 2017-01-07: 30 mL via URETHRAL

## 2017-01-07 MED ORDER — PROPOFOL 10 MG/ML IV BOLUS
INTRAVENOUS | Status: AC
  Filled 2017-01-07: qty 40

## 2017-01-07 MED ORDER — FENTANYL CITRATE (PF) 100 MCG/2ML IJ SOLN: 25.0000 ug | INTRAMUSCULAR | Status: DC | PRN

## 2017-01-07 MED ORDER — FENTANYL CITRATE (PF) 100 MCG/2ML IJ SOLN
INTRAMUSCULAR | Status: AC
  Filled 2017-01-07: qty 2

## 2017-01-07 MED ORDER — PROPOFOL 10 MG/ML IV BOLUS
INTRAVENOUS | Status: DC | PRN
  Administered 2017-01-07: 150 mg via INTRAVENOUS
  Administered 2017-01-07 (×2): 50 mg via INTRAVENOUS

## 2017-01-07 MED ORDER — FENTANYL CITRATE (PF) 100 MCG/2ML IJ SOLN
25.0000 ug | Freq: Once | INTRAMUSCULAR | Status: AC
  Administered 2017-01-07: 25 ug via INTRAVENOUS

## 2017-01-07 MED ORDER — CEFAZOLIN SODIUM-DEXTROSE 2-4 GM/100ML-% IV SOLN
2.0000 g | INTRAVENOUS | Status: AC
  Administered 2017-01-07: 2 g via INTRAVENOUS

## 2017-01-07 MED ORDER — ACETAMINOPHEN 650 MG RE SUPP
650.0000 mg | RECTAL | Status: DC | PRN
  Filled 2017-01-07: qty 1

## 2017-01-07 MED ORDER — SODIUM CHLORIDE 0.9% FLUSH: 3.0000 mL | INTRAVENOUS | Status: DC | PRN

## 2017-01-07 MED ORDER — FENTANYL CITRATE (PF) 100 MCG/2ML IJ SOLN
INTRAMUSCULAR | Status: DC | PRN
  Administered 2017-01-07: 25 ug via INTRAVENOUS
  Administered 2017-01-07: 50 ug via INTRAVENOUS

## 2017-01-07 SURGICAL SUPPLY — 26 items
BAG DRAIN URO TABLE W/ADPT NS (DRAPE) ×4 IMPLANT
BAG HAMPER (MISCELLANEOUS) ×4 IMPLANT
BAG URINE DRAINAGE (UROLOGICAL SUPPLIES) ×4 IMPLANT
CABLE HI FREQUENCY MONOPOLAR (ELECTROSURGICAL) ×4 IMPLANT
CATH FOLEY 2WAY SLVR  5CC 20FR (CATHETERS)
CATH FOLEY 2WAY SLVR 5CC 20FR (CATHETERS) IMPLANT
CATH URET 5FR 28IN OPEN ENDED (CATHETERS) IMPLANT
CLOTH BEACON ORANGE TIMEOUT ST (SAFETY) ×4 IMPLANT
ELECT LOOP 22F BIPOLAR SML (ELECTROSURGICAL) ×4
ELECT REM PT RETURN 9FT ADLT (ELECTROSURGICAL) ×4
ELECTRODE LOOP 22F BIPOLAR SML (ELECTROSURGICAL) ×2 IMPLANT
ELECTRODE REM PT RTRN 9FT ADLT (ELECTROSURGICAL) ×2 IMPLANT
GLOVE SURG SS PI 8.0 STRL IVOR (GLOVE) ×4 IMPLANT
GOWN STRL REIN XL XLG (GOWN DISPOSABLE) ×4 IMPLANT
GOWN STRL REUS W/TWL LRG LVL3 (GOWN DISPOSABLE) ×4 IMPLANT
GUIDEWIRE ANG ZIPWIRE 038X150 (WIRE) IMPLANT
GUIDEWIRE STR DUAL SENSOR (WIRE) IMPLANT
IV NS IRRIG 3000ML ARTHROMATIC (IV SOLUTION) ×4 IMPLANT
KIT ROOM TURNOVER AP CYSTO (KITS) ×4 IMPLANT
MANIFOLD NEPTUNE II (INSTRUMENTS) ×4 IMPLANT
PACK CYSTO (CUSTOM PROCEDURE TRAY) ×4 IMPLANT
PAD ARMBOARD 7.5X6 YLW CONV (MISCELLANEOUS) ×4 IMPLANT
SYR 30ML LL (SYRINGE) ×4 IMPLANT
SYRINGE IRR TOOMEY STRL 70CC (SYRINGE) ×4 IMPLANT
TOWEL OR 17X26 4PK STRL BLUE (TOWEL DISPOSABLE) ×8 IMPLANT
WATER STERILE IRR 3000ML UROMA (IV SOLUTION) ×4 IMPLANT

## 2017-01-07 NOTE — Anesthesia Postprocedure Evaluation (Signed)
Anesthesia Post Note  Patient: Andrew [name]  Procedure(s) Performed: Procedure(s) (LRB): CYSTOSCOPY WITH RETROGRADE PYELOGRAM (Bilateral) TRANSURETHRAL RESECTION OF BLADDER TUMOR (TURBT) (N/A)  Patient location during evaluation: PACU Anesthesia Type: General Level of consciousness: patient cooperative, awake and oriented Pain management: pain level controlled Vital Signs Assessment: post-procedure vital signs reviewed and stable Respiratory status: spontaneous breathing and respiratory function stable Cardiovascular status: stable Postop Assessment: no signs of nausea or vomiting Anesthetic complications: no     Last Vitals:  Vitals:   01/07/17 1150 01/07/17 1155  BP: 126/78 132/81  Pulse:    Resp: 20 20  Temp:    SpO2: 98% 98%    Last Pain:  Vitals:   01/07/17 1054  TempSrc: Oral  PainSc:                  ADAMS, AMY A

## 2017-01-07 NOTE — Patient Outreach (Signed)
[facility]) Care Management  01/07/2017  Andrew [name] [birth year] [id number]  MD referral for DM 2 and medication assistance:  Per chart review patient is currently having pre op encounter.  Plan: Will follow up next week.  Sherrin Daisy, RN BSN [place] Management Coordinator [facility] Care Management  [phone]

## 2017-01-07 NOTE — Op Note (Signed)
[name], [name] NO.:  [id number]  MEDICAL RECORD NO.:  [id number]  LOCATION:  [facility]                          FACILITY:  [facility]  PHYSICIAN:  Marshall Cork. Jeffie Pollock, M.D.    DATE OF BIRTH:  14-Mar-1951  DATE OF PROCEDURE:  01/07/2017 DATE OF DISCHARGE:                              OPERATIVE REPORT   PROCEDURE:  Cystoscopy with transurethral resection of 8 mm bladder tumor, with bilateral retrograde pyelograms with interpretation  PREOPERATIVE DIAGNOSIS:  Bladder tumor on the dome.  POSTOPERATIVE DIAGNOSIS:  Bladder tumor on the dome with an 8 mm tumor.  SURGEON:  Marshall Cork. Jeffie Pollock, M.D.  ANESTHESIA:  General.  SPECIMEN:  Bladder tumor.  DRAINS:  None.  ESTIMATED BLOOD LOSS:  Minimal.  COMPLICATIONS:  None.  INDICATIONS:  Andrew [name] is a 66 year old white male with a history of bladder tumors, who was found to have recurrence on the left dome of the bladder.  It was felt that cystoscopy with retrograde pyelography and resection of the tumor are indicated.  FINDINGS AND PROCEDURE:  He was taken to the operating room where a general anesthetic was induced.  He was placed in lithotomy position. His perineum and genitalia were prepped with Betadine solution.  He received antibiotics and was fitted with PAS hose.  His perineum and genitalia were prepped with Betadine solution.  He was draped in usual sterile fashion.  Cystoscopy was performed using a 23-French scope and 70-degree lens. His examination revealed a normal urethra.  The external sphincter was intact.  The prostatic urethra was short with mild bilobar hyperplasia without significant visual obstruction, but the bladder wall had moderate-to-severe trabeculation with cellules and small diverticula. The ureteral orifices were unremarkable.  There was approximately an 8 mm papillary tumor on the left dome.  The cystoscope was tight going in.  After a thorough inspection, bilateral retrograde pyelography  was performed with a 5-French open-end catheter.  The left retrograde pyelogram revealed a normal ureter and intrarenal collecting system.  Right retrograde pyelogram revealed a normal ureter and intrarenal collecting system.  After completion of retrograde pyelogram, I used a biopsy bridge to remove the 8 mm papillary tumor from the left dome.  The biopsy site was then fulgurated with a Bugbee electrode.  The lesion appeared to be superficial, so I did not feel that a deep resection was indicated and wanted to avoid the need to dilate the urethra for resectoscope.  At this point, once hemostasis is achieved, final inspection revealed no active bleeding and an otherwise intact bladder wall.  The bladder was partially drained.  The cystoscope was removed. The specimen was sent to pathology.  The patient was taken down from lithotomy position.  His anesthetic was reversed.  He was moved to recovery room in stable condition.     Marshall Cork. Jeffie Pollock, M.D.     JJW/MEDQ  D:  01/07/2017  T:  01/07/2017  Job:  [id number]

## 2017-01-07 NOTE — Brief Op Note (Signed)
01/07/2017  12:36 PM  PATIENT:  Andrew [name]  66 y.o. male  PRE-OPERATIVE DIAGNOSIS:  bladder cancer 57mm dome  POST-OPERATIVE DIAGNOSIS:  bladder cancer 44mm dome  PROCEDURE:  Procedure(s): CYSTOSCOPY WITH RETROGRADE PYELOGRAM (Bilateral) TRANSURETHRAL RESECTION OF BLADDER TUMOR (TURBT) (N/A) 25mm  SURGEON:  Surgeon(s) and Role:    Irine Seal, MD - Primary  PHYSICIAN ASSISTANT:   ASSISTANTS: none   ANESTHESIA:   general  EBL:  Total I/O In: 600 [I.V.:600] Out: -   BLOOD ADMINISTERED:none  DRAINS: none   LOCAL MEDICATIONS USED:  NONE  SPECIMEN:  Bladder biopsy  DISPOSITION OF SPECIMEN:  PATHOLOGY  COUNTS:  YES  TOURNIQUET:  * No tourniquets in log *  DICTATION: .Other Dictation: Dictation Number [id number]  PLAN OF CARE: Discharge to home after PACU  PATIENT DISPOSITION:  PACU - hemodynamically stable.   Delay start of Pharmacological VTE agent (>24hrs) due to surgical blood loss or risk of bleeding: not applicable

## 2017-01-07 NOTE — Anesthesia Preprocedure Evaluation (Signed)
Anesthesia Evaluation  Patient identified by MRN, date of birth, ID band Patient awake    Reviewed: Allergy & Precautions, NPO status , Patient's Chart, lab work & pertinent test results  History of Anesthesia Complications (+) PROLONGED EMERGENCE and history of anesthetic complications ("slow to wake up")  Airway Mallampati: II  TM Distance: >3 FB     Dental  (+) Partial Lower   Pulmonary Current Smoker, PE: am cough.   breath sounds clear to auscultation       Cardiovascular negative cardio ROS   Rhythm:Regular Rate:Normal     Neuro/Psych PSYCHIATRIC DISORDERS Depression Benign essential tremor  Neuromuscular disease CVA, No Residual Symptoms    GI/Hepatic Neg liver ROS, GERD  Medicated and Controlled,  Endo/Other  diabetes, Well Controlled, Type 2, Oral Hypoglycemic AgentsHyperlipidemia  Renal/GU negative Renal ROS   Bladder Ca    Musculoskeletal  (+) Arthritis , Osteoarthritis,    Abdominal Normal abdominal exam  (+)   Peds  Hematology  (+) anemia ,   Anesthesia Other Findings   Reproductive/Obstetrics                             Anesthesia Physical Anesthesia Plan  ASA: III  Anesthesia Plan: General   Post-op Pain Management:    Induction: Intravenous  PONV Risk Score and Plan:   Airway Management Planned: LMA  Additional Equipment:   Intra-op Plan:   Post-operative Plan: Extubation in OR  Informed Consent: I have reviewed the patients History and Physical, chart, labs and discussed the procedure including the risks, benefits and alternatives for the proposed anesthesia with the patient or authorized representative who has indicated his/her understanding and acceptance.     Plan Discussed with:   Anesthesia Plan Comments:         Anesthesia Quick Evaluation

## 2017-01-07 NOTE — Discharge Instructions (Signed)

## 2017-01-07 NOTE — Anesthesia Procedure Notes (Signed)
Procedure Name: LMA Insertion Date/Time: 01/07/2017 12:10 PM Performed by: Andree Elk, AMY A Pre-anesthesia Checklist: Patient identified, Timeout performed, Emergency Drugs available, Suction available and Patient being monitored Patient Re-evaluated:Patient Re-evaluated prior to induction Oxygen Delivery Method: Circle system utilized Preoxygenation: Pre-oxygenation with 100% oxygen Induction Type: IV induction Ventilation: Mask ventilation without difficulty LMA: LMA inserted LMA Size: 5.0 Number of attempts: 1 Placement Confirmation: positive ETCO2 and breath sounds checked- equal and bilateral Tube secured with: Tape Dental Injury: Teeth and Oropharynx as per pre-operative assessment

## 2017-01-07 NOTE — H&P (Signed)
CC: I have bladder cancer.  HPI: Andrew [name] is a 66 year-old male established patient who is here for bladder cancer.    Andrew [name] returns today in f/u for a preop visit. He was found [name] cystoscopy in May to have a 1cm tumor [name] the dome. He is voiding without complaints but his urine has been dark and he has 2+ blood [name] the dip today. He has had prior LG tumors and elected to delay TURBT but that is [name] for next Friday.      ALLERGIES: Trulicity SOPN    MEDICATIONS: Glimepiride 4 mg tablet Oral  Januvia 1 PO Daily  Metformin Hcl 1,000 mg tablet Oral  Omeprazole 20 mg tablet, delayed release Oral  Propranolol Hcl Er 160 mg capsule, extended release 24hr Oral  Simvastatin 80 mg tablet Oral     GU PSH: Cystoscopy - 10/01/2016, 04/30/2016, 12/12/2015      PSH Notes: Rotator Cuff Repair, Leg Repair, Arthroscopy Knee Right, Appendectomy, Bladder Surgery   NON-GU PSH: Appendectomy - 08/08/2015 Knee Arthroscopy; Dx - 08/08/2015    GU PMH: Bladder Cancer Trigone - 10/01/2016 Bladder Cancer Anterior - 01/30/2016 Other microscopic hematuria - 12/12/2015 History of bladder cancer, History of malignant neoplasm of bladder - 09/12/2015, History of bladder cancer, - 08/08/2015 Urethral Cancer, Malignant neoplasm of bladder neck - 09/12/2015    NON-GU PMH: Personal history of other diseases of the circulatory system, History of hypertension - 08/08/2015 Personal history of other diseases of the nervous system and sense organs, History of sleep apnea - 08/08/2015 Personal history of other endocrine, nutritional and metabolic disease, History of diabetes mellitus - 08/08/2015, History of hypercholesterolemia, - 08/08/2015 Personal history of transient ischemic attack (TIA), and cerebral infarction without residual deficits, History of stroke - 08/08/2015 Encounter for general adult medical examination without abnormal findings, Encounter for preventive health examination    FAMILY HISTORY: Diabetes -  Runs In Family   SOCIAL HISTORY: Marital Status: Divorced Current Smoking Status: Patient smokes. Smokes 1 pack per day.      Notes: Alcohol use, Retired, Tobacco use, Caffeine use, Divorced   REVIEW OF SYSTEMS:    GU Review Male:   Patient reports frequent urination, hard to postpone urination, get up at night to urinate, and erection problems. Patient denies burning/ pain with urination, leakage of urine, stream starts and stops, trouble starting your stream, have to strain to urinate , and penile pain.  Gastrointestinal (Upper):   Patient denies nausea, vomiting, and indigestion/ heartburn.  Gastrointestinal (Lower):   Patient denies diarrhea and constipation.  Constitutional:   Patient reports fatigue. Patient denies fever, night sweats, and weight loss.  Skin:   Patient reports itching. Patient denies skin rash/ lesion.  Eyes:   Patient denies blurred vision and double vision.  Ears/ Nose/ Throat:   Patient denies sore throat and sinus problems.  Hematologic/Lymphatic:   Patient denies swollen glands and easy bruising.  Cardiovascular:   Patient denies chest pains and leg swelling.  Respiratory:   Patient denies cough and shortness of breath.  Endocrine:   Patient denies excessive thirst.  Musculoskeletal:   Patient reports joint pain. Patient denies back pain.  Neurological:   Patient reports dizziness. Patient denies headaches.  Psychologic:   Patient denies depression and anxiety.   VITAL SIGNS:      12/31/2016 03:20 PM  BP 150/60 mmHg  Pulse 96 /min  Temperature 98.0 F / 36.6 C   PAST DATA REVIEWED:  Source Of History:  Patient  Urine Test Review:   Urinalysis   PROCEDURES:          Urinalysis - 81003 Dipstick Dipstick Cont'd  Specimen: Voided Bilirubin: Neg  Color: Yellow Ketones: Neg  Appearance: Clear Blood: 2+  Specific Gravity: 1.020 Protein: 1+  pH: 6.0 Urobilinogen: 0.2  Glucose: 3+ Nitrites: Neg    Leukocyte Esterase: Neg    ASSESSMENT:      ICD-10  Details  1 GU:   Bladder Cancer Dome - C67.1 He has a 1cm tumor [name] the dome. We will proceed with TURBT next week. He is aware of the risks of the procedure.      PLAN:           Schedule Return Visit/Planned Activity: Keep Scheduled Appointment             Note: He is [name] for TURBT next Friday.

## 2017-01-07 NOTE — Transfer of Care (Signed)
Immediate Anesthesia Transfer of Care Note  Patient: Andrew [name]  Procedure(s) Performed: Procedure(s): CYSTOSCOPY WITH RETROGRADE PYELOGRAM (Bilateral) TRANSURETHRAL RESECTION OF BLADDER TUMOR (TURBT) (N/A)  Patient Location: PACU  Anesthesia Type:General  Level of Consciousness: drowsy  Airway & Oxygen Therapy: Patient Spontanous Breathing and Patient connected to face mask oxygen  Post-op Assessment: Report given to RN and Post -op Vital signs reviewed and stable  Post vital signs: Reviewed and stable  Last Vitals:  Vitals:   01/07/17 1150 01/07/17 1155  BP: 126/78 132/81  Pulse:    Resp: 20 20  Temp:    SpO2: 98% 98%    Last Pain:  Vitals:   01/07/17 1054  TempSrc: Oral  PainSc:       Patients Stated Pain Goal: 8 (35/24/81 8590)  Complications: No apparent anesthesia complications

## 2017-01-10 ENCOUNTER — Other Ambulatory Visit: Payer: Self-pay | Admitting: *Deleted

## 2017-01-10 ENCOUNTER — Telehealth: Payer: Self-pay | Admitting: *Deleted

## 2017-01-10 ENCOUNTER — Encounter (HOSPITAL_COMMUNITY): Payer: Self-pay | Admitting: Urology

## 2017-01-10 NOTE — Telephone Encounter (Signed)
Incoming call from pt Pt had surgery on Friday to remove tumor from bladder Pt reports having diarrhea with intermittent fever Per Dr Evette Doffing, pt needs to be seen here or with urology Attempted to call pt to schedule Unable to leave message

## 2017-01-10 NOTE — Patient Outreach (Signed)
[facility]) Care Management  01/10/2017  Andrew [name] Oct 07, 1950 [id number]  Referral from MD office; need Diabetes Mellitus 2 management & medication assistance:  Telephone call to patient; person answered call & advised that patient was not available to take call but they would take contact information & give to patient to return call.   Plan: Will follow up with patient.  Sherrin Daisy, RN BSN [place] Management Coordinator [facility] Care Management  [phone]

## 2017-01-11 ENCOUNTER — Ambulatory Visit: Payer: Medicare Other | Admitting: Family

## 2017-01-12 ENCOUNTER — Other Ambulatory Visit: Payer: Self-pay | Admitting: *Deleted

## 2017-01-12 NOTE — Patient Outreach (Signed)
[facility]) Care Management  01/12/2017  Nephtali Docken January 05, 1951 [id number]  Referral via MD office; needs diabetes management (A1c went from 7.4 to 9.3); needs medication assistance for Invokana (in donut hole as of 11/2016):  Telephone call to patient who was advised of reason for call & of [facility] care management services.    Patient voices that major concerns are he is currently in donut hole and can't afford Invokana medication. States also his diabetes is not under control. States last A1c level was elevated to 9.0 and target is 7.0. States he is currently taking other diabetes medications & was given samples of [name] from MD office. .   Patient states he has workable glucometer & monitors his own blood sugars.  States does want to try to get diabetes under control.   Patient consents to referral to [facility] for disease management & Pharmacist for medication assistance.   Plan: Send to care management assistant to assign [place], RN BSN [place] Management Coordinator [facility] Care Management  [phone]

## 2017-01-14 ENCOUNTER — Emergency Department (HOSPITAL_COMMUNITY): Payer: Medicare Other

## 2017-01-14 ENCOUNTER — Encounter (HOSPITAL_COMMUNITY): Payer: Self-pay | Admitting: *Deleted

## 2017-01-14 ENCOUNTER — Ambulatory Visit: Payer: Medicare Other | Admitting: Urology

## 2017-01-14 ENCOUNTER — Emergency Department (HOSPITAL_COMMUNITY)
Admission: EM | Admit: 2017-01-14 | Discharge: 2017-01-14 | Disposition: A | Discharge status: Home / Self Care | Payer: Medicare Other | Attending: Emergency Medicine | Admitting: Emergency Medicine

## 2017-01-14 DIAGNOSIS — M5442 Lumbago with sciatica, left side: Secondary | ICD-10-CM | POA: Insufficient documentation

## 2017-01-14 DIAGNOSIS — F17210 Nicotine dependence, cigarettes, uncomplicated: Secondary | ICD-10-CM | POA: Diagnosis not present

## 2017-01-14 DIAGNOSIS — M546 Pain in thoracic spine: Secondary | ICD-10-CM | POA: Diagnosis not present

## 2017-01-14 DIAGNOSIS — Z79899 Other long term (current) drug therapy: Secondary | ICD-10-CM | POA: Diagnosis not present

## 2017-01-14 DIAGNOSIS — M5440 Lumbago with sciatica, unspecified side: Secondary | ICD-10-CM

## 2017-01-14 DIAGNOSIS — R319 Hematuria, unspecified: Secondary | ICD-10-CM | POA: Diagnosis not present

## 2017-01-14 DIAGNOSIS — E119 Type 2 diabetes mellitus without complications: Secondary | ICD-10-CM | POA: Insufficient documentation

## 2017-01-14 DIAGNOSIS — R52 Pain, unspecified: Secondary | ICD-10-CM | POA: Diagnosis not present

## 2017-01-14 DIAGNOSIS — M545 Low back pain: Secondary | ICD-10-CM | POA: Diagnosis present

## 2017-01-14 DIAGNOSIS — Z7984 Long term (current) use of oral hypoglycemic drugs: Secondary | ICD-10-CM | POA: Insufficient documentation

## 2017-01-14 DIAGNOSIS — Z8551 Personal history of malignant neoplasm of bladder: Secondary | ICD-10-CM | POA: Insufficient documentation

## 2017-01-14 DIAGNOSIS — Z7982 Long term (current) use of aspirin: Secondary | ICD-10-CM | POA: Diagnosis not present

## 2017-01-14 LAB — URINALYSIS, ROUTINE W REFLEX MICROSCOPIC
BILIRUBIN URINE: NEGATIVE
Bacteria, UA: NONE SEEN
GLUCOSE, UA: 150 mg/dL — AB
Hgb urine dipstick: NEGATIVE
KETONES UR: NEGATIVE mg/dL
LEUKOCYTES UA: NEGATIVE
NITRITE: NEGATIVE
PH: 5 (ref 5.0–8.0)
Protein, ur: 30 mg/dL — AB
Specific Gravity, Urine: 1.028 (ref 1.005–1.030)

## 2017-01-14 LAB — CBC WITH DIFFERENTIAL/PLATELET
BASOS ABS: 0.1 10*3/uL (ref 0.0–0.1)
Basophils Relative: 1 %
EOS PCT: 5 %
Eosinophils Absolute: 0.5 10*3/uL (ref 0.0–0.7)
HCT: 47.1 % (ref 39.0–52.0)
Hemoglobin: 16.7 g/dL (ref 13.0–17.0)
LYMPHS PCT: 31 %
Lymphs Abs: 3.2 10*3/uL (ref 0.7–4.0)
MCH: 32.1 pg (ref 26.0–34.0)
MCHC: 35.5 g/dL (ref 30.0–36.0)
MCV: 90.6 fL (ref 78.0–100.0)
MONO ABS: 1.2 10*3/uL — AB (ref 0.1–1.0)
Monocytes Relative: 12 %
Neutro Abs: 5.1 10*3/uL (ref 1.7–7.7)
Neutrophils Relative %: 51 %
PLATELETS: 238 10*3/uL (ref 150–400)
RBC: 5.2 MIL/uL (ref 4.22–5.81)
RDW: 12.6 % (ref 11.5–15.5)
WBC: 10.1 10*3/uL (ref 4.0–10.5)

## 2017-01-14 LAB — COMPREHENSIVE METABOLIC PANEL
ALT: 21 U/L (ref 17–63)
AST: 21 U/L (ref 15–41)
Albumin: 3.5 g/dL (ref 3.5–5.0)
Alkaline Phosphatase: 58 U/L (ref 38–126)
Anion gap: 9 (ref 5–15)
BUN: 15 mg/dL (ref 6–20)
CHLORIDE: 100 mmol/L — AB (ref 101–111)
CO2: 24 mmol/L (ref 22–32)
Calcium: 9 mg/dL (ref 8.9–10.3)
Creatinine, Ser: 0.67 mg/dL (ref 0.61–1.24)
Glucose, Bld: 206 mg/dL — ABNORMAL HIGH (ref 65–99)
POTASSIUM: 3.4 mmol/L — AB (ref 3.5–5.1)
Sodium: 133 mmol/L — ABNORMAL LOW (ref 135–145)
Total Bilirubin: 0.6 mg/dL (ref 0.3–1.2)
Total Protein: 6.7 g/dL (ref 6.5–8.1)

## 2017-01-14 MED ORDER — SODIUM CHLORIDE 0.9 % IV BOLUS (SEPSIS)
1000.0000 mL | Freq: Once | INTRAVENOUS | Status: AC
  Administered 2017-01-14: 1000 mL via INTRAVENOUS

## 2017-01-14 MED ORDER — ONDANSETRON HCL 4 MG/2ML IJ SOLN
4.0000 mg | Freq: Once | INTRAMUSCULAR | Status: AC
  Administered 2017-01-14: 4 mg via INTRAVENOUS
  Filled 2017-01-14: qty 2

## 2017-01-14 MED ORDER — OXYCODONE-ACETAMINOPHEN 5-325 MG PO TABS: 1.0000 | ORAL_TABLET | ORAL | 0 refills | Status: DC | PRN

## 2017-01-14 MED ORDER — CYCLOBENZAPRINE HCL 10 MG PO TABS: 10.0000 mg | ORAL_TABLET | Freq: Three times a day (TID) | ORAL | 0 refills | Status: DC | PRN

## 2017-01-14 MED ORDER — HYDROMORPHONE HCL 1 MG/ML IJ SOLN
1.0000 mg | Freq: Once | INTRAMUSCULAR | Status: AC
  Administered 2017-01-14: 1 mg via INTRAVENOUS
  Filled 2017-01-14: qty 1

## 2017-01-14 MED ORDER — KETOROLAC TROMETHAMINE 30 MG/ML IJ SOLN
15.0000 mg | Freq: Once | INTRAMUSCULAR | Status: AC
  Administered 2017-01-14: 15 mg via INTRAVENOUS
  Filled 2017-01-14: qty 1

## 2017-01-14 NOTE — ED Notes (Signed)
MD at bedside. 

## 2017-01-14 NOTE — ED Notes (Signed)
Pt placed on 2L 02 [place] for 02 sats 90%.  

## 2017-01-14 NOTE — Discharge Instructions (Signed)
Rest at home for a few days. Follow-up with her family doctor next week for recheck

## 2017-01-14 NOTE — ED Notes (Signed)
Pt 02 sat was 90% on RA.  PT encouraged to take deep breaths, now 95% RA.

## 2017-01-14 NOTE — ED Triage Notes (Signed)
Pt c/o lower back pain that started Friday, getting worse tonight, pain is worse with movement, pt states that he had pain similar to this a year ago,

## 2017-01-14 NOTE — ED Provider Notes (Signed)
[facility] DEPT Provider Note   CSN: [id number] Arrival date & time: 01/14/17  1093     History   Chief Complaint Chief Complaint  Patient presents with  . Back Pain    HPI Andrew [name] is a 66 y.o. male.  Patient complains of pain in lower back radiating down his left side. Patient also has some pain on his right side   The history is provided by the patient.  Back Pain   This is a new problem. The current episode started more than 2 days ago. The problem occurs constantly. The problem has not changed since onset.The pain is associated with no known injury. The pain is present in the lumbar spine. The quality of the pain is described as stabbing. The pain radiates to the right thigh. The pain is at a severity of 6/10. The pain is moderate. The symptoms are aggravated by bending. Pertinent negatives include no chest pain, no headaches and no abdominal pain.    Past Medical History:  Diagnosis Date  . Anemia   . Arthritis   . Cancer [facility])    Bladder  . Depression   . Diabetes mellitus without complication ([place])   . GERD (gastroesophageal reflux disease)   . Hypercholesteremia   . Hyperlipidemia   . Neuromuscular disorder ([place])   . Status post tendon repair 1989  . Stroke ([place])    At times pt has dizziness with loss of vision  . Tremors of nervous system 2011    Patient Active Problem List   Diagnosis Date Noted  . Sleep apnea 10/06/2016  . Back spasm 01/15/2016  . Generalized edema 01/15/2016  . Tobacco use disorder 12/19/2015  . Diabetes ([place]) 12/19/2015  . Hyperlipidemia 12/19/2015  . Bladder cancer ([place]) 12/19/2015  . Esophageal reflux 12/19/2015  . Essential tremor 12/19/2015  . History of stroke 12/19/2015    Past Surgical History:  Procedure Laterality Date  . APPENDECTOMY    . BLADDER SURGERY    . CYSTOSCOPY W/ RETROGRADES Bilateral 08/22/2015   Procedure: CYSTOSCOPY WITH RETROGRADE PYELOGRAM;  Surgeon: Irine Seal, MD;  Location: [facility];   Service: Urology;  Laterality: Bilateral;  . CYSTOSCOPY W/ RETROGRADES Bilateral 01/16/2016   Procedure: CYSTOSCOPY WITH RETROGRADE PYELOGRAM;  Surgeon: Irine Seal, MD;  Location: [facility];  Service: Urology;  Laterality: Bilateral;  . CYSTOSCOPY W/ RETROGRADES Bilateral 01/07/2017   Procedure: CYSTOSCOPY WITH RETROGRADE PYELOGRAM;  Surgeon: Irine Seal, MD;  Location: [facility];  Service: Urology;  Laterality: Bilateral;  . CYSTOSCOPY WITH BIOPSY N/A 08/22/2015   Procedure: CYSTOSCOPY WITH BLADDER BIOPSY;  Surgeon: Irine Seal, MD;  Location: [facility];  Service: Urology;  Laterality: N/A;  . CYSTOSCOPY WITH BIOPSY N/A 01/16/2016   Procedure: CYSTOSCOPY WITH BLADDER BIOPSY;  Surgeon: Irine Seal, MD;  Location: [facility];  Service: Urology;  Laterality: N/A;  . CYSTOSCOPY WITH FULGERATION N/A 01/16/2016   Procedure: CYSTOSCOPY WITH FULGERATION;  Surgeon: Irine Seal, MD;  Location: [facility];  Service: Urology;  Laterality: N/A;  . KNEE ARTHROSCOPY Right 1989  . ROTATOR CUFF REPAIR Bilateral 2000  . TRANSURETHRAL RESECTION OF BLADDER TUMOR N/A 01/07/2017   Procedure: TRANSURETHRAL RESECTION OF BLADDER TUMOR (TURBT);  Surgeon: Irine Seal, MD;  Location: [facility];  Service: Urology;  Laterality: N/A;       Home Medications    Prior to Admission medications   Medication Sig Start Date End Date Taking? Authorizing Provider  aspirin 81 MG tablet Take 81 mg by mouth daily.   Yes  [provider]  dapagliflozin propanediol (FARXIGA) 5 MG TABS tablet Take 10 mg by mouth daily. 01/03/17  Yes Eustaquio Maize, MD  diclofenac (VOLTAREN) 75 MG EC tablet Take 1 tablet (75 mg total) by mouth 2 (two) times daily. For muscle and  Joint pain Patient taking differently: Take 150 mg by mouth daily as needed for moderate pain (muscle or joint pain).  01/28/16  Yes Claretta Fraise, MD  furosemide (LASIX) 40 MG tablet Take 1 tablet (40 mg total) by mouth daily. 07/06/16  Yes Eustaquio Maize, MD  glimepiride (AMARYL) 4 MG tablet  TAKE 2 TABLETS ONCE DAILY WITH BREAKFAST 01/03/17  Yes Eustaquio Maize, MD  metFORMIN (GLUCOPHAGE) 1000 MG tablet Take 1 tablet (1,000 mg total) by mouth 2 (two) times daily with a meal. 01/03/17  Yes Eustaquio Maize, MD  naproxen sodium (ANAPROX) 220 MG tablet Take 440-660 mg by mouth 2 (two) times daily as needed (pain).   Yes [provider]  omeprazole (PRILOSEC) 20 MG capsule TAKE (1) CAPSULE DAILY 01/03/17  Yes Eustaquio Maize, MD  pravastatin (PRAVACHOL) 80 MG tablet Take 1 tablet (80 mg total) by mouth daily. 01/03/17  Yes Eustaquio Maize, MD  primidone (MYSOLINE) 50 MG tablet Take 1 tablet (50 mg total) by mouth daily. 01/03/17  Yes Eustaquio Maize, MD  sitaGLIPtin (JANUVIA) 100 MG tablet Take 1 tablet (100 mg total) by mouth daily. 01/03/17  Yes Eustaquio Maize, MD  cyclobenzaprine (FLEXERIL) 10 MG tablet Take 1 tablet (10 mg total) by mouth 3 (three) times daily as needed for muscle spasms. 01/14/17   Milton Ferguson, MD  oxyCODONE-acetaminophen (PERCOCET/ROXICET) 5-325 MG tablet Take 1 tablet by mouth every 4 (four) hours as needed. 01/14/17   Milton Ferguson, MD  traMADol (ULTRAM) 50 MG tablet Take 1 tablet (50 mg total) by mouth every 6 (six) hours as needed. Patient not taking: Reported on 01/14/2017 01/07/17 01/07/18  Irine Seal, MD    Family History Family History  Problem Relation Age of Onset  . Diabetes Mother 33       type 1  . Stroke Mother   . Dementia Father   . Diabetes Brother   . Heart attack Brother   . Cancer Brother        testicular    Social History Social History  Substance Use Topics  . Smoking status: Current Every Day Smoker    Packs/day: 1.00    Years: 42.00    Types: Cigarettes  . Smokeless tobacco: Never Used  . Alcohol use No     Allergies   Tramadol and Trulicity [dulaglutide]   Review of Systems Review of Systems  Constitutional: Negative for appetite change and fatigue.  HENT: Negative for congestion, ear discharge and sinus  pressure.   Eyes: Negative for discharge.  Respiratory: Negative for cough.   Cardiovascular: Negative for chest pain.  Gastrointestinal: Negative for abdominal pain and diarrhea.  Genitourinary: Negative for frequency and hematuria.  Musculoskeletal: Positive for back pain.  Skin: Negative for rash.  Neurological: Negative for seizures and headaches.  Psychiatric/Behavioral: Negative for hallucinations.     Physical Exam Updated Vital Signs BP 133/76   Pulse 78   Temp 98.4 F (36.9 C)   Resp 18   Ht 6\' 4"  (1.93 m)   Wt 99.8 kg (220 lb)   SpO2 97%   BMI 26.78 kg/m   Physical Exam  Constitutional: He is oriented to person, place, and time. He appears  well-developed.  HENT:  Head: Normocephalic.  Eyes: Conjunctivae and EOM are normal. No scleral icterus.  Neck: Neck supple. No thyromegaly present.  Cardiovascular: Normal rate and regular rhythm.  Exam reveals no gallop and no friction rub.   No murmur heard. Pulmonary/Chest: No stridor. He has no wheezes. He has no rales. He exhibits no tenderness.  Abdominal: He exhibits no distension. There is no tenderness. There is no rebound.  Musculoskeletal: Normal range of motion. He exhibits no edema.  Tender lumbar spine and pos straight leg raise left  Lymphadenopathy:    He has no cervical adenopathy.  Neurological: He is oriented to person, place, and time. He exhibits normal muscle tone. Coordination normal.  Skin: No rash noted. No erythema.  Psychiatric: He has a normal mood and affect. His behavior is normal.     ED Treatments / Results  Labs (all labs ordered are listed, but only abnormal results are displayed) Labs Reviewed  CBC WITH DIFFERENTIAL/PLATELET - Abnormal; Notable for the following:       Result Value   Monocytes Absolute 1.2 (*)    All other components within normal limits  COMPREHENSIVE METABOLIC PANEL - Abnormal; Notable for the following:    Sodium 133 (*)    Potassium 3.4 (*)    Chloride 100  (*)    Glucose, Bld 206 (*)    All other components within normal limits  URINALYSIS, ROUTINE W REFLEX MICROSCOPIC - Abnormal; Notable for the following:    Glucose, UA 150 (*)    Protein, ur 30 (*)    Squamous Epithelial / LPF 0-5 (*)    All other components within normal limits  URINE CULTURE    EKG  EKG Interpretation None       Radiology Ct Renal Stone Study  Result Date: 01/14/2017 CLINICAL DATA:  Sharp back pain and hematuria. Recent bladder tumor removed 1 week ago. EXAM: CT ABDOMEN AND PELVIS WITHOUT CONTRAST TECHNIQUE: Multidetector CT imaging of the abdomen and pelvis was performed following the standard protocol without IV contrast. COMPARISON:  None. FINDINGS: Lower chest: No acute abnormality. Right coronary artery atherosclerotic vascular calcifications. Hepatobiliary: Hepatic steatosis. Scattered punctate granulomas throughout the liver, consistent with prior granulomatous disease. The gallbladder is decompressed. No biliary dilatation. Pancreas: Unremarkable. No pancreatic ductal dilatation or surrounding inflammatory changes. Spleen: Normal size without focal abnormality. Multiple punctate calcifications within the spleen, consistent with prior granulomatous disease. Adrenals/Urinary Tract: The adrenal glands are unremarkable. No renal or ureteral calculi. Minimal left greater than right perinephric fat stranding, nonspecific. No hydronephrosis. The bladder is unremarkable. Stomach/Bowel: Stomach is within normal limits. Appendix is surgically absent. No evidence of bowel wall thickening, distention, or inflammatory changes. Scattered left-sided colonic diverticulosis. Vascular/Lymphatic: Aortic atherosclerosis. No enlarged abdominal or pelvic lymph nodes. Reproductive: Prostate is unremarkable. Other: No abdominal wall hernia or abnormality. No abdominopelvic ascites. Musculoskeletal: No acute osseous abnormality. Degenerative changes of the thoracolumbar spine with severe  lower lumbar facet arthropathy. 6 mm anterolisthesis of L4 on L5. IMPRESSION: 1. No evidence of acute intra-abdominal process. No explanation for the patient's symptoms. 2. No renal or ureteral calculi.  No hydronephrosis. 3. Hepatic steatosis. 4.  Aortic atherosclerosis (ICD10-I70.0). Electronically Signed   By: Titus Dubin M.D.   On: 01/14/2017 08:52    Procedures Procedures (including critical care time)  Medications Ordered in ED Medications  HYDROmorphone (DILAUDID) injection 1 mg (1 mg Intravenous Given 01/14/17 0731)  ondansetron (ZOFRAN) injection 4 mg (4 mg Intravenous Given 01/14/17 0731)  ketorolac (  TORADOL) 30 MG/ML injection 15 mg (15 mg Intravenous Given 01/14/17 1033)  sodium chloride 0.9 % bolus 1,000 mL (0 mLs Intravenous Stopped 01/14/17 1150)     Initial Impression / Assessment and Plan / ED Course  I have reviewed the triage vital signs and the nursing notes.  Pertinent labs & imaging results that were available during my care of the patient were reviewed by me and considered in my medical decision making (see chart for details).     Patient with lumbar pain with mild sciatica. We will treat him with muscle relaxer and pain medicine and rest. He will follow-up with his PCP next week  Final Clinical Impressions(s) / ED Diagnoses   Final diagnoses:  Acute left-sided low back pain with sciatica, sciatica laterality unspecified    New Prescriptions New Prescriptions   CYCLOBENZAPRINE (FLEXERIL) 10 MG TABLET    Take 1 tablet (10 mg total) by mouth 3 (three) times daily as needed for muscle spasms.   OXYCODONE-ACETAMINOPHEN (PERCOCET/ROXICET) 5-325 MG TABLET    Take 1 tablet by mouth every 4 (four) hours as needed.     Milton Ferguson, MD 01/14/17 [phone]

## 2017-01-14 NOTE — ED Notes (Signed)
Family member came out of room stating "hes laying there in pain, apparently there is no doctor here and he does not even have a call bell."  I went into the room to find the call bell wrapped around the bed rail laying in the patient's bed within reach. Explained to family where the call bell was and explained to them that the EDP had just walked in the door. Repositioned pt.

## 2017-01-14 NOTE — ED Notes (Signed)
Pt made aware to return if symptoms worsen or if any life threatening symptoms occur.   

## 2017-01-16 LAB — URINE CULTURE: CULTURE: NO GROWTH

## 2017-01-17 ENCOUNTER — Other Ambulatory Visit: Payer: Self-pay

## 2017-01-17 ENCOUNTER — Telehealth: Payer: Self-pay | Admitting: Pharmacist

## 2017-01-17 NOTE — Telephone Encounter (Signed)
Patient seen on 08/13

## 2017-01-17 NOTE — Patient Outreach (Signed)
[facility] [facility]) Care Management  [place]  01/17/2017   Andrew [name] 06/12/1950 [id number]  Subjective: Telephone call to patient for initial assessment.  Patient reports having it rough with his recent back issues for which he is on Flexeril and Oxycodone.  Patient reports that his A1c was 9.3 due to him being in the doughnut hole now.  Patient reports that his Andrew [name] is $480 per month and he is having problems affording it.  Patient reports previously that his A1c was in the 7 range but due to inactivity and inability to afford [employer] his A1c has increased.  Patient receptive to health coach contact as he attempts to get his A1c back down.  He reports that he lives with his friend Andrew [name], who really helps him.  Patient able to manage his own medications and normally checks his sugars about twice a day.  Last check was 163.  Discussed with patient diabetes and goal of getting A1c back down in the 7 range.  He verbalized understanding.     Objective:   Encounter Medications:  Outpatient Encounter Prescriptions as of 01/17/2017  Medication Sig Note  . aspirin 81 MG tablet Take 81 mg by mouth daily. 12/30/2016: Holding for 5 days prior to surgery.  . cyclobenzaprine (FLEXERIL) 10 MG tablet Take 1 tablet (10 mg total) by mouth 3 (three) times daily as needed for muscle spasms.   . dapagliflozin propanediol (FARXIGA) 5 MG TABS tablet Take 10 mg by mouth daily.   . diclofenac (VOLTAREN) 75 MG EC tablet Take 1 tablet (75 mg total) by mouth 2 (two) times daily. For muscle and  Joint pain (Patient taking differently: Take 150 mg by mouth daily as needed for moderate pain (muscle or joint pain). )   . furosemide (LASIX) 40 MG tablet Take 1 tablet (40 mg total) by mouth daily.   Marland Kitchen glimepiride (AMARYL) 4 MG tablet TAKE 2 TABLETS ONCE DAILY WITH BREAKFAST   . metFORMIN (GLUCOPHAGE) 1000 MG tablet Take 1 tablet (1,000 mg total) by mouth 2 (two) times daily with a meal.   .  naproxen sodium (ANAPROX) 220 MG tablet Take 440-660 mg by mouth 2 (two) times daily as needed (pain).   Marland Kitchen omeprazole (PRILOSEC) 20 MG capsule TAKE (1) CAPSULE DAILY   . oxyCODONE-acetaminophen (PERCOCET/ROXICET) 5-325 MG tablet Take 1 tablet by mouth every 4 (four) hours as needed.   . pravastatin (PRAVACHOL) 80 MG tablet Take 1 tablet (80 mg total) by mouth daily.   . primidone (MYSOLINE) 50 MG tablet Take 1 tablet (50 mg total) by mouth daily.   . sitaGLIPtin (JANUVIA) 100 MG tablet Take 1 tablet (100 mg total) by mouth daily.   . traMADol (ULTRAM) 50 MG tablet Take 1 tablet (50 mg total) by mouth every 6 (six) hours as needed. (Patient not taking: Reported on 01/14/2017)    No facility-administered encounter medications on file as of 01/17/2017.     Functional Status:  In your present state of health, do you have any difficulty performing the following activities: 01/17/2017 01/03/2017  Hearing? N N  Vision? N N  Difficulty concentrating or making decisions? N N  Walking or climbing stairs? Y N  Dressing or bathing? N N  Doing errands, shopping? N N  Preparing Food and eating ? N -  Using the Toilet? N -  In the past six months, have you accidently leaked urine? N -  Do you have problems with loss of bowel control? N -  Managing your Medications? N -  Managing your Finances? N -  Housekeeping or managing your Housekeeping? Y -  Some recent data might be hidden    Fall/Depression Screening: Fall Risk  01/17/2017 01/12/2017 01/03/2017  Falls in the past year? Yes Yes No  Number falls in past yr: 2 or more 2 or more -  Injury with Fall? - No -  Risk Factor Category  - High Fall Risk -  Risk for fall due to : - History of fall(s) -  Follow up - Falls prevention discussed -   PHQ 2/9 Scores 01/17/2017 01/12/2017 01/03/2017 10/06/2016 08/10/2016 07/06/2016 02/25/2016  PHQ - 2 Score 0 0 0 0 2 0 0  PHQ- 9 Score - - - 3 8 - -    Assessment: Patient will benefit from health coach outreach for  disease management and support of diabetes.  Plan:  [facility] CM Care Plan Problem One     Most Recent Value  Care Plan Problem One  Increased A1c  Role Documenting the Problem One  [place] for Problem One  Active  [facility] Long Term Goal   Patient will decrease A1c by 0.5 points within the next 90 days.  [facility]N Long Term Goal Start Date  01/17/17  Interventions for Problem One [place] discussed with patient A1c and goals.   [facility] CM Short Term Goal #1   Patient will be able to report maintaining low carbohydrate diet within 30 days.   [facility]N CM Short Term Goal #1 Start Date  01/17/17  Interventions for Short Term Goal #1  RN Health Coach discussed avoidance of high carbohydrate foods such as potatoes, rice, and pastas.   [facility] CM Short Term Goal #2   Patient will be able to report increase in activity in the next 30 days.  [facility]N CM Short Term Goal #2 Start Date  01/17/17  Interventions for Short Term Goal #2  [name] discussed with patient importance of activity to lower blood sugar.       RN Health Coach will provide ongoing education for patient on diabetes through phone calls and sending printed information to patient for further discussion.  RN Health Coach will send welcome packet with consent to patient as well as printed information on diabetes.  RN Health Coach will send initial barriers letter, assessment, and care plan to primary care physician.  RN Health Coach will contact patient in the month of September and patient agrees to next outreach.  [name], RN, MSN [place] [phone]

## 2017-01-17 NOTE — Patient Outreach (Signed)
[facility]) Care Management  01/17/2017  Andrew [name] December 08, 1950 [id number]   Telephone call to patient for initial assessment.  Patient requests a call back a little later today.  Advised patient that I would attempt him another time.  He verbalized understanding.   Plan: RN Health Coach will attempt patient again within the next ten business days.    Andrew Baseman, RN, MSN [place] [phone]

## 2017-01-17 NOTE — Patient Outreach (Signed)
[facility]) Care Management  01/17/2017  Andrew [name] 31-Aug-1950 [id number]   Called patient regarding medication assistance. No answer. HIPAA compliant message left on patient's voicemail.    Plan: Call patient back in 5-7 business days.  Elayne Guerin, PharmD, [place] Clinical Pharmacist [phone]

## 2017-01-21 ENCOUNTER — Telehealth: Payer: Self-pay | Admitting: Pediatrics

## 2017-01-21 NOTE — Telephone Encounter (Signed)
Patient aware to take [place] Per Dr. Autumn Patty last note

## 2017-01-22 ENCOUNTER — Other Ambulatory Visit: Payer: Self-pay | Admitting: Pediatrics

## 2017-01-22 DIAGNOSIS — R609 Edema, unspecified: Secondary | ICD-10-CM

## 2017-01-25 ENCOUNTER — Encounter: Payer: Self-pay | Admitting: Pharmacist

## 2017-01-25 ENCOUNTER — Other Ambulatory Visit: Payer: Self-pay | Admitting: Pharmacist

## 2017-01-25 NOTE — Patient Outreach (Signed)
[place]) Care Management  [place]   01/25/2017  Andrew [name] Mar 02, 1951 [id number]  Subjective: Patient is a 66 year old male called regarding medication assistance per referral.  HIPAA identifiers were obtained. Patient has multiple medical conditions including but not limited to:  Type 2 diabetes, bladder cancer, esophageal reflux, generalized edema, history of stroke, hyperlipidemia sleep apnea and tobacco use.   Patient reported managing his medications on his own without difficulty but shared he has issue affording Januvia. He has been getting samples of [name] and is afraid it will be too expensive as well.  Objective:   Encounter Medications: Outpatient Encounter Prescriptions as of 01/25/2017  Medication Sig Note  . aspirin 81 MG tablet Take 81 mg by mouth daily. 12/30/2016: Holding for 5 days prior to surgery.  . cyclobenzaprine (FLEXERIL) 10 MG tablet Take 1 tablet (10 mg total) by mouth 3 (three) times daily as needed for muscle spasms.   . dapagliflozin propanediol (FARXIGA) 5 MG TABS tablet Take 10 mg by mouth daily.   . furosemide (LASIX) 40 MG tablet Take 1 tablet (40 mg total) by mouth daily.   Marland Kitchen glimepiride (AMARYL) 4 MG tablet TAKE 2 TABLETS ONCE DAILY WITH BREAKFAST   . metFORMIN (GLUCOPHAGE) 1000 MG tablet Take 1 tablet (1,000 mg total) by mouth 2 (two) times daily with a meal. 01/25/2017: PRN only  . naproxen sodium (ANAPROX) 220 MG tablet Take 440-660 mg by mouth 2 (two) times daily as needed (pain).   Marland Kitchen omeprazole (PRILOSEC) 20 MG capsule TAKE (1) CAPSULE DAILY   . pravastatin (PRAVACHOL) 80 MG tablet Take 1 tablet (80 mg total) by mouth daily.   . primidone (MYSOLINE) 50 MG tablet Take 1 tablet (50 mg total) by mouth daily.   . sitaGLIPtin (JANUVIA) 100 MG tablet Take 1 tablet (100 mg total) by mouth daily.   . diclofenac (VOLTAREN) 75 MG EC tablet Take 1 tablet (75 mg total) by mouth 2 (two) times daily. For muscle and  Joint pain (Patient  not taking: Reported on 01/25/2017)   . [DISCONTINUED] oxyCODONE-acetaminophen (PERCOCET/ROXICET) 5-325 MG tablet Take 1 tablet by mouth every 4 (four) hours as needed. (Patient not taking: Reported on 01/25/2017)   . [DISCONTINUED] traMADol (ULTRAM) 50 MG tablet Take 1 tablet (50 mg total) by mouth every 6 (six) hours as needed. (Patient not taking: Reported on 01/14/2017)    No facility-administered encounter medications on file as of 01/25/2017.     Functional Status: In your present state of health, do you have any difficulty performing the following activities: 01/17/2017 01/03/2017  Hearing? N N  Vision? N N  Difficulty concentrating or making decisions? N N  Walking or climbing stairs? Y N  Dressing or bathing? N N  Doing errands, shopping? N N  Preparing Food and eating ? N -  Using the Toilet? N -  In the past six months, have you accidently leaked urine? N -  Do you have problems with loss of bowel control? N -  Managing your Medications? N -  Managing your Finances? N -  Housekeeping or managing your Housekeeping? Y -  Some recent data might be hidden    Fall/Depression Screening: Fall Risk  01/17/2017 01/12/2017 01/03/2017  Falls in the past year? Yes Yes No  Number falls in past yr: 2 or more 2 or more -  Injury with Fall? - No -  Risk Factor Category  - High Fall Risk -  Risk for fall due  to : - History of fall(s) -  Follow up - Falls prevention discussed -   PHQ 2/9 Scores 01/17/2017 01/12/2017 01/03/2017 10/06/2016 08/10/2016 07/06/2016 02/25/2016  PHQ - 2 Score 0 0 0 0 2 0 0  PHQ- 9 Score - - - 3 8 - -      Assessment: Patient's medications were reviewed via telephone.     Drugs sorted by system:  Neurologic/Psychologic: Primidone  Cardiovascular: Pravastatin Furosemide Aspirin  Pulmonary/Allergy:  Gastrointestinal: Omeprazole  Endocrine: Metformin Glimepiride Farxiga Januvia  Pain: Naproxen Cyclobenzaprine (Is almost finished with this--was given at  01/14/17 ED visit for back pain)   Medication Assistance Findings:  -patient is over income for the Extra Help Program sponsored by [employer] -upon initial review, patient may be eligible to receive Januvia from [facility] Patient Assistance Program and [place] from [employer]).   Plan: -A letter will be routed to [place], [name] to send the corresponding applications to the patient  -Route note to patient's provider    Elayne Guerin, PharmD, [place] Clinical Pharmacist [phone]

## 2017-01-28 ENCOUNTER — Telehealth: Payer: Self-pay | Admitting: Pediatrics

## 2017-01-28 NOTE — Telephone Encounter (Signed)
(  THN)Working on application for med assistance they faxed on over, but they need the original for [employer] form page 2 needs to be printed out and mailed to [place] attn [place]. Please advise

## 2017-01-31 NOTE — Telephone Encounter (Signed)
Fax came in today. Form put on Dr. Autumn Patty desk

## 2017-02-04 ENCOUNTER — Ambulatory Visit (INDEPENDENT_AMBULATORY_CARE_PROVIDER_SITE_OTHER): Payer: Medicare Other | Admitting: Urology

## 2017-02-04 DIAGNOSIS — C671 Malignant neoplasm of dome of bladder: Secondary | ICD-10-CM | POA: Diagnosis not present

## 2017-02-07 ENCOUNTER — Other Ambulatory Visit: Payer: Self-pay

## 2017-02-07 NOTE — Patient Outreach (Signed)
[facility] [facility]) Care Management  02/07/2017  Andrew [name] 12-22-1950 [id number]   Telephone call to patient for monthly call. No answer.  HIPAA compliant voice message left.   Plan: RN Health Coach will attempt patient again in the month of September.   [name], RN, MSN [place] [phone]

## 2017-02-07 NOTE — Telephone Encounter (Signed)
LMOVM that form was faxed on 02/04/12

## 2017-02-09 DIAGNOSIS — Z0289 Encounter for other administrative examinations: Secondary | ICD-10-CM

## 2017-02-14 ENCOUNTER — Other Ambulatory Visit: Payer: Self-pay

## 2017-02-14 NOTE — Patient Outreach (Signed)
[place] [facility]) Care Management  [place]  02/14/2017   Andrew [name] Jan 28, 1951 [id number]  Subjective: Spoke with the patient and HIPAA verified.  The patient states that he is doing well.  He states that he spoke with the pharmacist and needs to mail some paperwork into [place] and he will do that today. The patient states that he has samples of his Januvia and is taking that and his other medications.  He states that he checked his blood sugar this morning fasting and it was 161. He is very enthusiastic about trying to lower his a1c. The patient stated that for breakfast he had biscuits and gravy but he only has that every once in a while. RN Health Coach dicussed with the patient how diet is important and how it relates to lowering his blood sugars. The patient also stated that he has been able to increase his activities slightly more than it has been.  Objective:   Encounter Medications:  Outpatient Encounter Prescriptions as of 02/14/2017  Medication Sig Note  . aspirin 81 MG tablet Take 81 mg by mouth daily. 12/30/2016: Holding for 5 days prior to surgery.  . dapagliflozin propanediol (FARXIGA) 5 MG TABS tablet Take 10 mg by mouth daily.   . furosemide (LASIX) 40 MG tablet Take 1 tablet (40 mg total) by mouth daily.   Marland Kitchen glimepiride (AMARYL) 4 MG tablet TAKE 2 TABLETS ONCE DAILY WITH BREAKFAST   . metFORMIN (GLUCOPHAGE) 1000 MG tablet Take 1 tablet (1,000 mg total) by mouth 2 (two) times daily with a meal. 01/25/2017: PRN only  . omeprazole (PRILOSEC) 20 MG capsule TAKE (1) CAPSULE DAILY   . pravastatin (PRAVACHOL) 80 MG tablet Take 1 tablet (80 mg total) by mouth daily.   . primidone (MYSOLINE) 50 MG tablet Take 1 tablet (50 mg total) by mouth daily.   . sitaGLIPtin (JANUVIA) 100 MG tablet Take 1 tablet (100 mg total) by mouth daily.   . cyclobenzaprine (FLEXERIL) 10 MG tablet Take 1 tablet (10 mg total) by mouth 3 (three) times daily as needed for muscle spasms. (Patient  not taking: Reported on 02/14/2017)   . diclofenac (VOLTAREN) 75 MG EC tablet Take 1 tablet (75 mg total) by mouth 2 (two) times daily. For muscle and  Joint pain (Patient not taking: Reported on 01/25/2017)   . naproxen sodium (ANAPROX) 220 MG tablet Take 440-660 mg by mouth 2 (two) times daily as needed (pain).    No facility-administered encounter medications on file as of 02/14/2017.     Functional Status:  In your present state of health, do you have any difficulty performing the following activities: 01/17/2017 01/03/2017  Hearing? N N  Vision? N N  Difficulty concentrating or making decisions? N N  Walking or climbing stairs? Y N  Dressing or bathing? N N  Doing errands, shopping? N N  Preparing Food and eating ? N -  Using the Toilet? N -  In the past six months, have you accidently leaked urine? N -  Do you have problems with loss of bowel control? N -  Managing your Medications? N -  Managing your Finances? N -  Housekeeping or managing your Housekeeping? Y -  Some recent data might be hidden    Fall/Depression Screening: Fall Risk  02/14/2017 01/17/2017 01/12/2017  Falls in the past year? Yes Yes Yes  Number falls in past yr: 2 or more 2 or more 2 or more  Injury with Fall? No - No  Risk Factor Category  - - High Fall Risk  Risk for fall due to : - - History of fall(s)  Follow up - - Falls prevention discussed   PHQ 2/9 Scores 02/14/2017 01/17/2017 01/12/2017 01/03/2017 10/06/2016 08/10/2016 07/06/2016  PHQ - 2 Score 1 0 0 0 0 2 0  PHQ- 9 Score - - - - 3 8 -     Assessment: Patient continues to benefit from health coach outreach for disease management and support.   Plan: RN Health Coach will contact patient in the month of October and patient agrees to next outreach.  THN CM Care Plan Problem One     Most Recent Value  Care Plan Problem One  Increased A1c  Role Documenting the Problem One  [place] for Problem One  Active  THN Long Term Goal   Patient will  decrease A1c by 0.5 points within the next 90 days.  THN Long Term Goal Start Date  02/14/17  Interventions for Problem One [place] reviewed with patient A1c and goals.   THN CM Short Term Goal #1   Patient will be able to report maintaining low carbohydrate diet within 30 days.   THN CM Short Term Goal #1 Start Date  02/14/17  Interventions for Short Term Goal #1  RN Health Coach reviewed avoidance of high carbohydrate foods such as potatoes, rice, and pastas.   THN CM Short Term Goal #2   Patient will be able to report increase in activity in the next 30 days.  THN CM Short Term Goal #2 Start Date  02/14/17  Interventions for Short Term Goal #2  [place] reviewed with patient importance of activity to lower blood sugar.       Lazaro Arms RN, BSN, [place] Direct Dial:  [phone] Fax: [phone]

## 2017-02-15 ENCOUNTER — Telehealth: Payer: Self-pay | Admitting: Pediatrics

## 2017-02-16 ENCOUNTER — Other Ambulatory Visit: Payer: Self-pay | Admitting: Pharmacy Technician

## 2017-02-16 NOTE — Patient Outreach (Signed)
[place] [facility]) Care Management  02/16/2017  Shaunte Weissinger 1950-07-12 [id number]   I received the Merck application for Januvia from Dr. Autumn Patty office today and after looking over it there was several fields that  needed to be completed. Due to Merck requiring the original application I had to mail it back to Dr. Evette Doffing for completion.  [name], [place] [phone]

## 2017-02-21 ENCOUNTER — Other Ambulatory Visit: Payer: Self-pay | Admitting: Pharmacy Technician

## 2017-02-21 NOTE — Patient Outreach (Signed)
[place] [facility]) Care Management  02/21/2017  Andrew [name] [birth year] [id number]   I returned patient's call today in reference to paient assistance application's that were previously mailed to him [employer] and [employer]. Patient needed some assistance with a few questions and documentation that he needs in order to complete the patient portion of the application. I will able to address all of his concerns over the phone and he will mail out the application's this week.  [name], [place] [phone]

## 2017-02-21 NOTE — Patient Outreach (Signed)
[place] [facility]) Care Management  02/21/2017  Daril Warga 1951/03/07 [id number]  I returned a call to [facility] at Dr. Autumn Patty office in reference to the Merck patient assistance application for Januvia that we've been working on. The application was previously mailed back to [facility] but was missing one of the required signatures therefore I called the office to inform them that I would need both signatures in order for Merck to approve. [name] wasn't sure what I needed so I was able to clarify and also remind her that Merck requires the original which means it will need to be mailed back to [facility] again and not faxed.  [name], [place] [phone]

## 2017-02-21 NOTE — Telephone Encounter (Signed)
New page 2 of form printed Placed on Dr. Autumn Patty desk Crystal aware Dr. Evette Doffing out of office till 02/28/17

## 2017-03-03 ENCOUNTER — Other Ambulatory Visit: Payer: Self-pay | Admitting: Pharmacy Technician

## 2017-03-03 NOTE — Patient Outreach (Signed)
[name] [facility]) Care Management  03/03/2017  Andrew [name] 06/20/1950 [id number]  I followed-up with [name] today in reference to previous call on 02-21-17 about Merck patient assistance application that was returned for the provider to sign. [name] informed me that Dr. Evette Doffing has been on vacation and the application was mailed back to [facility] this week. In the process she let me know that [employer] faxed the application for Farxiga back to the office stating that the prescription was not legal and that they will need a new prescription.  PLAN:  1. Once the provider portion of the Merck application is received I will mail it to [employer] for processing. 2. [name] will contact me if she has any additional questions about the [employer] application. 3. If I have not heard from [place] I will follow-up with [employer] In 5-7 business days.  [name], [name] [phone]

## 2017-03-04 ENCOUNTER — Other Ambulatory Visit: Payer: Self-pay | Admitting: Pharmacy Technician

## 2017-03-04 NOTE — Patient Outreach (Signed)
[place] [facility]) Care Management  03/04/2017  Brennan Litzinger Jan 18, 1951 [id number]  I received the provider portion of the Merck patient assistance application for Januvia on yesterday. The completed application was mailed to [employer] today and I have already made the patient aware of the steps to follow once it's mailed back to him along with the attestation. I also requested the patient to follow-up with me once he receives it. If I haven't heard from him in 10-14 business days I will follow-up with him again. The patient is aware with this particular program it's roughly a 4 week process.  [name], [place] [phone]

## 2017-03-08 ENCOUNTER — Other Ambulatory Visit: Payer: Self-pay

## 2017-03-08 NOTE — Patient Outreach (Signed)
[place] [facility]) Care Management  03/08/2017  Randol Zumstein 1951/03/01 [id number]      Outreach attempt made. No Answer.  RN Health Coach left HIPAA compliant message along with contact info.    Plan:  RN Health Coach will make outreach attempt to patient in the month of November.   Lazaro Arms RN, BSN, [place] Direct Dial:  [phone] Fax: [phone]   \

## 2017-03-08 NOTE — Patient Outreach (Addendum)
[facility]) Care Management  [place]  03/08/2017   Rashee Marschall 10/22/1950 [id number]  Subjective: Patient returned call this morning.  The patient stated that he has been out of town and went off of his diet some but now he is back on schedule.  He checked his blood sugar last night 2 hours after a meal and it was 102.  This morning his blood sugar fasting was 160.  The patient states that his right leg below the knee has been hurting him for the last 2-3 weeks and he rates that pain at a 6.  He has some aleve that he takes if he needs it.  RN Health Coach advised the patient to call his physician for an appointment.  RN Health Coach talked with the patient about using nutritional supplements and gave him names like Ensure or Glucerna.  RN Health Coach advised the patient to discuss this information with his physician and see what she prefers to help him.  Objective:   Encounter Medications:  Outpatient Encounter Prescriptions as of 03/08/2017  Medication Sig Note  . aspirin 81 MG tablet Take 81 mg by mouth daily. 12/30/2016: Holding for 5 days prior to surgery.  . cyclobenzaprine (FLEXERIL) 10 MG tablet Take 1 tablet (10 mg total) by mouth 3 (three) times daily as needed for muscle spasms.   . dapagliflozin propanediol (FARXIGA) 5 MG TABS tablet Take 10 mg by mouth daily.   . furosemide (LASIX) 40 MG tablet Take 1 tablet (40 mg total) by mouth daily.   Marland Kitchen glimepiride (AMARYL) 4 MG tablet TAKE 2 TABLETS ONCE DAILY WITH BREAKFAST   . metFORMIN (GLUCOPHAGE) 1000 MG tablet Take 1 tablet (1,000 mg total) by mouth 2 (two) times daily with a meal. 01/25/2017: PRN only  . omeprazole (PRILOSEC) 20 MG capsule TAKE (1) CAPSULE DAILY   . pravastatin (PRAVACHOL) 80 MG tablet Take 1 tablet (80 mg total) by mouth daily.   . primidone (MYSOLINE) 50 MG tablet Take 1 tablet (50 mg total) by mouth daily.   . sitaGLIPtin (JANUVIA) 100 MG tablet Take 1 tablet (100 mg total) by mouth  daily.   . diclofenac (VOLTAREN) 75 MG EC tablet Take 1 tablet (75 mg total) by mouth 2 (two) times daily. For muscle and  Joint pain (Patient not taking: Reported on 01/25/2017)   . naproxen sodium (ANAPROX) 220 MG tablet Take 440-660 mg by mouth 2 (two) times daily as needed (pain).    No facility-administered encounter medications on file as of 03/08/2017.     Functional Status:  In your present state of health, do you have any difficulty performing the following activities: 01/17/2017 01/03/2017  Hearing? N N  Vision? N N  Difficulty concentrating or making decisions? N N  Walking or climbing stairs? Y N  Dressing or bathing? N N  Doing errands, shopping? N N  Preparing Food and eating ? N -  Using the Toilet? N -  In the past six months, have you accidently leaked urine? N -  Do you have problems with loss of bowel control? N -  Managing your Medications? N -  Managing your Finances? N -  Housekeeping or managing your Housekeeping? Y -  Some recent data might be hidden    Fall/Depression Screening: Fall Risk  03/08/2017 02/14/2017 01/17/2017  Falls in the past year? No Yes Yes  Number falls in past yr: - 2 or more 2 or more  Injury with Fall? - No -  Risk Factor Category  - - -  Risk for fall due to : - - -  Follow up - - -   PHQ 2/9 Scores 03/08/2017 02/14/2017 01/17/2017 01/12/2017 01/03/2017 10/06/2016 08/10/2016  PHQ - 2 Score 3 1 0 0 0 0 2  PHQ- 9 Score 8 - - - - 3 8    Assessment: Patient continues to benefit from health coach outreach for disease management and support.   THN CM Care Plan Problem One     Most Recent Value  Care Plan Problem One  Increased A1c  Role Documenting the Problem One  [place] for Problem One  Active  THN Long Term Goal   Patient will decrease A1c by 0.5 points within the next 90 days.  THN Long Term Goal Start Date  02/14/17  Interventions for Problem One [place] disucssed with patient A1c and goals.    THN CM Short Term Goal #1   Patient will be able to report maintaining low carbohydrate diet within 30 days.   THN CM Short Term Goal #1 Start Date  02/14/17  Interventions for Short Term Goal #1  RN Health Coach discussed avoidance of high carbohydrate foods   THN CM Short Term Goal #2   Patient will be able to report increase in activity in the next 30 days.  THN CM Short Term Goal #2 Start Date  02/14/17  Interventions for Short Term Goal #2  [place] discussed with patient importance of activity to lower blood sugar.         Plan: RN Health Coach will contact patient in the month of November and patient agrees to next outreach  [place], BSN, [place]:  [phone] Fax: [phone]

## 2017-03-14 ENCOUNTER — Ambulatory Visit (INDEPENDENT_AMBULATORY_CARE_PROVIDER_SITE_OTHER): Payer: Medicare Other | Admitting: Pediatrics

## 2017-03-14 ENCOUNTER — Encounter: Payer: Self-pay | Admitting: Pediatrics

## 2017-03-14 ENCOUNTER — Ambulatory Visit (INDEPENDENT_AMBULATORY_CARE_PROVIDER_SITE_OTHER): Payer: Medicare Other

## 2017-03-14 VITALS — BP 115/73 | HR 94 | Temp 98.3°F | Ht 76.0 in | Wt 216.0 lb

## 2017-03-14 DIAGNOSIS — M79604 Pain in right leg: Secondary | ICD-10-CM

## 2017-03-14 DIAGNOSIS — Z23 Encounter for immunization: Secondary | ICD-10-CM | POA: Diagnosis not present

## 2017-03-14 DIAGNOSIS — E119 Type 2 diabetes mellitus without complications: Secondary | ICD-10-CM | POA: Diagnosis not present

## 2017-03-14 MED ORDER — DAPAGLIFLOZIN PROPANEDIOL 10 MG PO TABS: 10.0000 mg | ORAL_TABLET | Freq: Every day | ORAL | 4 refills | Status: DC

## 2017-03-14 NOTE — Progress Notes (Signed)
  Subjective:   Patient ID: Ryaan Vanwagoner, male    DOB: 1951-02-05, 66 y.o.   MRN: [id number] CC: Leg Pain  HPI: Greene Diodato is a 66 y.o. male presenting for Leg Pain  Has had two major injuries to R knee Around 2000 "shattered" the prox part of R tibia Says surgery was done to compress it together because they couldn't fix it  Other episode in the 1990s when R knee cap was twisted to back of his knee when his ankle was caught in a truck and he fell forward  Past few weeks ant R tibia has been hurting more again No recent injury Limited ROM R knee compared with L Pain with weight bearing most of the time now  Relevant past medical, surgical, family and social history reviewed. Allergies and medications reviewed and updated. History  Smoking Status  . Current Every Day Smoker  . Packs/day: 1.00  . Years: 42.00  . Types: Cigarettes  Smokeless Tobacco  . Never Used   ROS: Per HPI   Objective:    BP 115/73   Pulse 94   Temp 98.3 F (36.8 C) (Oral)   Ht 6\' 4"  (1.93 m)   Wt 216 lb (98 kg)   BMI 26.29 kg/m   Wt Readings from Last 3 Encounters:  03/14/17 216 lb (98 kg)  01/14/17 220 lb (99.8 kg)  01/03/17 222 lb (100.7 kg)    Gen: NAD, alert, cooperative with exam, NCAT EYES: EOMI, no conjunctival injection, or no icterus CV: NRRR, normal S1/S2, no murmur Resp: CTABL, no wheezes, normal WOB Abd: +BS, soft, NTND. no guarding or organomegaly Ext: No edema, warm Neuro: Alert and oriented, strength equal b/l UE and LE, coordination grossly normal MSK: bony enlargement R knee, decreased ROM R knee, 90 degrees to 180 Nl ROM L knee Minimal crepitus present b/l Bony nodule ant R shin No ttp along ant R shin   Assessment & Plan:  Mihcael was seen today for leg pain.  Diagnoses and all orders for this visit:  Right leg pain -     DG Tibia/Fibula Right; Future  Need for immunization against influenza -     Flu Vaccine QUAD 36+ mos IM   Follow up plan: As  scheduled Assunta Found, MD [place]

## 2017-03-23 ENCOUNTER — Ambulatory Visit: Payer: Medicare Other | Admitting: Pediatrics

## 2017-03-28 ENCOUNTER — Ambulatory Visit (INDEPENDENT_AMBULATORY_CARE_PROVIDER_SITE_OTHER): Payer: Medicare Other | Admitting: Family Medicine

## 2017-03-28 ENCOUNTER — Encounter: Payer: Self-pay | Admitting: Family Medicine

## 2017-03-28 ENCOUNTER — Other Ambulatory Visit: Payer: Self-pay | Admitting: Pharmacy Technician

## 2017-03-28 ENCOUNTER — Ambulatory Visit (INDEPENDENT_AMBULATORY_CARE_PROVIDER_SITE_OTHER): Payer: Medicare Other

## 2017-03-28 VITALS — BP 114/68 | HR 106 | Temp 98.7°F | Ht 76.0 in | Wt 209.0 lb

## 2017-03-28 DIAGNOSIS — R059 Cough, unspecified: Secondary | ICD-10-CM

## 2017-03-28 DIAGNOSIS — R05 Cough: Secondary | ICD-10-CM | POA: Diagnosis not present

## 2017-03-28 MED ORDER — LEVOFLOXACIN 500 MG PO TABS: 500.0000 mg | ORAL_TABLET | Freq: Every day | ORAL | 0 refills | Status: DC

## 2017-03-28 MED ORDER — BETAMETHASONE SOD PHOS & ACET 6 (3-3) MG/ML IJ SUSP
6.0000 mg | Freq: Once | INTRAMUSCULAR | Status: AC
  Administered 2017-03-28: 6 mg via INTRAMUSCULAR

## 2017-03-28 MED ORDER — CEFTRIAXONE SODIUM 1 G IJ SOLR
1.0000 g | Freq: Once | INTRAMUSCULAR | Status: AC
  Administered 2017-03-28: 1 g via INTRAMUSCULAR

## 2017-03-28 NOTE — Patient Outreach (Signed)
[place] [facility]) Care Management  03/28/2017  Andrew [name] Feb 04, 1951 [id number]  Successful outreach call to Andrew [name]. HIPAA identifiers verified and verbal consent received. I informed patient he does not qualify for [employer]ck or [place] patient assistance program due to his income. I will route a message to [place], [place] for case closure.  [name], [place] [phone]

## 2017-03-28 NOTE — Progress Notes (Signed)
Subjective:  Patient ID: Andrew [name], male    DOB: [birth year]  Age: 66 y.o. MRN: [id number]  CC: Cough (pt here today c/o cough and chills)   HPI Andrew [name] presents for two weeks of increasing cough. Has had fever, chills and sweats at night. Feels dyspneic and chest is tight. Producing thick yellow sputum. Head is congested. No rhinorrhea. Onset after taking the flu shot 2 weeks ago.  Over-the-counter remedies have not been helpful.  He is a cigarette smoker but laid off about a week ago because he was getting tighter chest and shortness of breath  Depression screen [facility] 2/9 03/28/2017 03/14/2017 03/08/2017  Decreased Interest 1 1 2   Down, Depressed, Hopeless 1 1 1   PHQ - 2 Score 2 2 3   Altered sleeping 1 1 2   Tired, decreased energy 1 1 1   Change in appetite 1 1 1   Feeling bad or failure about yourself  0 0 0  Trouble concentrating 1 1 1   Moving slowly or fidgety/restless 0 0 0  Suicidal thoughts 0 0 0  PHQ-9 Score 6 6 8   Difficult doing work/chores Somewhat difficult Somewhat difficult Somewhat difficult    History Andrew [name] has a past medical history of Anemia, Arthritis, Cancer ([place]), Depression, Diabetes mellitus without complication ([place]), GERD (gastroesophageal reflux disease), Hypercholesteremia, Hyperlipidemia, Neuromuscular disorder ([place]), Status post tendon repair (1989), Stroke [facility]), and Tremors of nervous system (2011).   He has a past surgical history that includes Bladder surgery; Appendectomy; Knee arthroscopy (Right, 1989); Rotator cuff repair (Bilateral, 2000); CYSTOSCOPY WITH RETROGRADE PYELOGRAM (Bilateral, 01/07/2017); TRANSURETHRAL RESECTION OF BLADDER TUMOR (TURBT) (N/A, 01/07/2017); CYSTOSCOPY WITH RETROGRADE PYELOGRAM (Bilateral, 01/16/2016); CYSTOSCOPY WITH BLADDER BIOPSY (N/A, 01/16/2016); CYSTOSCOPY WITH FULGERATION (N/A, 01/16/2016); CYSTOSCOPY WITH RETROGRADE PYELOGRAM (Bilateral, 08/22/2015); and CYSTOSCOPY WITH BLADDER BIOPSY (N/A, 08/22/2015).   His family  history includes Cancer in his brother; Dementia in his father; Diabetes in his brother; Diabetes (age of onset: 36) in his mother; Heart attack in his brother; Stroke in his mother.He reports that he has been smoking cigarettes.  He has a 42.00 pack-year smoking history. he has never used smokeless tobacco. He reports that he does not drink alcohol or use drugs.    ROS Review of Systems  Constitutional: Negative for chills, diaphoresis and fever.  HENT: Negative for rhinorrhea and sore throat.   Respiratory: Negative for cough and shortness of breath.   Cardiovascular: Negative for chest pain.  Gastrointestinal: Negative for abdominal pain.  Musculoskeletal: Negative for arthralgias and myalgias.  Skin: Negative for rash.  Neurological: Negative for weakness and headaches.    Objective:  BP 114/68   Pulse (!) 106   Temp 98.7 F (37.1 C) (Oral)   Ht 6\' 4"  (1.93 m)   Wt 209 lb (94.8 kg)   BMI 25.44 kg/m   BP Readings from Last 3 Encounters:  03/28/17 114/68  03/14/17 115/73  01/14/17 129/73    Wt Readings from Last 3 Encounters:  03/28/17 209 lb (94.8 kg)  03/14/17 216 lb (98 kg)  01/14/17 220 lb (99.8 kg)     Physical Exam  Constitutional: He appears well-developed and well-nourished.  HENT:  Head: Normocephalic and atraumatic.  Right Ear: Tympanic membrane and external ear normal. No decreased hearing is noted.  Left Ear: Tympanic membrane and external ear normal. No decreased hearing is noted.  Nose: Mucosal edema present. Right sinus exhibits maxillary sinus tenderness. Right sinus exhibits no frontal sinus tenderness. Left sinus exhibits maxillary sinus tenderness. Left sinus exhibits no  frontal sinus tenderness.  Mouth/Throat: No oropharyngeal exudate or posterior oropharyngeal erythema.  Neck: No Brudzinski's sign noted.  Pulmonary/Chest: No respiratory distress. He has wheezes. He has rales (left base).  Lymphadenopathy:       Head (right side): No  preauricular adenopathy present.       Head (left side): No preauricular adenopathy present.       Right cervical: No superficial cervical adenopathy present.      Left cervical: No superficial cervical adenopathy present.   Chest x-ray was negative for infiltrate however it did show signs of atelectasis and COPD   Assessment & Plan:   Andrew [name] was seen today for cough.  Diagnoses and all orders for this visit:  Cough -     betamethasone acetate-betamethasone sodium phosphate (CELESTONE) injection 6 mg -     cefTRIAXone (ROCEPHIN) injection 1 g -     DG Chest 2 View; Future  Other orders -     levofloxacin (LEVAQUIN) 500 MG tablet; Take 1 tablet (500 mg total) daily by mouth.       I am having Andrew [name] start on levofloxacin. I am also having him maintain his aspirin, naproxen sodium, glimepiride, metFORMIN, omeprazole, pravastatin, sitaGLIPtin, primidone, furosemide, and dapagliflozin propanediol. We administered betamethasone acetate-betamethasone sodium phosphate and cefTRIAXone.  Allergies as of 03/28/2017      Reactions   Tramadol Shortness Of Breath   And dizziness   Trulicity [dulaglutide] Swelling      Medication List        Accurate as of 03/28/17  7:50 PM. Always use your most recent med list.          aspirin 81 MG tablet Take 81 mg by mouth daily.   dapagliflozin propanediol 10 MG Tabs tablet Commonly known as:  FARXIGA Take 10 mg by mouth daily.   furosemide 40 MG tablet Commonly known as:  LASIX Take 1 tablet (40 mg total) by mouth daily.   glimepiride 4 MG tablet Commonly known as:  AMARYL TAKE 2 TABLETS ONCE DAILY WITH BREAKFAST   levofloxacin 500 MG tablet Commonly known as:  LEVAQUIN Take 1 tablet (500 mg total) daily by mouth.   metFORMIN 1000 MG tablet Commonly known as:  GLUCOPHAGE Take 1 tablet (1,000 mg total) by mouth 2 (two) times daily with a meal.   naproxen sodium 220 MG tablet Commonly known as:  ALEVE Take 440-660 mg by  mouth 2 (two) times daily as needed (pain).   omeprazole 20 MG capsule Commonly known as:  PRILOSEC TAKE (1) CAPSULE DAILY   pravastatin 80 MG tablet Commonly known as:  PRAVACHOL Take 1 tablet (80 mg total) by mouth daily.   primidone 50 MG tablet Commonly known as:  MYSOLINE Take 1 tablet (50 mg total) by mouth daily.   sitaGLIPtin 100 MG tablet Commonly known as:  JANUVIA Take 1 tablet (100 mg total) by mouth daily.        Follow-up: Return in about 3 days (around 03/31/2017), or if symptoms worsen or fail to improve, for Infection.  Claretta Fraise, M.D.

## 2017-03-30 ENCOUNTER — Encounter: Payer: Self-pay | Admitting: Pharmacist

## 2017-04-06 ENCOUNTER — Ambulatory Visit (INDEPENDENT_AMBULATORY_CARE_PROVIDER_SITE_OTHER): Payer: Medicare Other | Admitting: Pediatrics

## 2017-04-06 ENCOUNTER — Encounter: Payer: Self-pay | Admitting: Pediatrics

## 2017-04-06 VITALS — BP 131/78 | HR 95 | Temp 97.9°F | Ht 76.0 in | Wt 216.0 lb

## 2017-04-06 DIAGNOSIS — G250 Essential tremor: Secondary | ICD-10-CM

## 2017-04-06 DIAGNOSIS — L84 Corns and callosities: Secondary | ICD-10-CM

## 2017-04-06 DIAGNOSIS — E785 Hyperlipidemia, unspecified: Secondary | ICD-10-CM | POA: Diagnosis not present

## 2017-04-06 DIAGNOSIS — E119 Type 2 diabetes mellitus without complications: Secondary | ICD-10-CM

## 2017-04-06 LAB — BAYER DCA HB A1C WAIVED: HB A1C (BAYER DCA - WAIVED): 8.4 % — ABNORMAL HIGH (ref <7.0)

## 2017-04-06 MED ORDER — SITAGLIPTIN PHOSPHATE 100 MG PO TABS: 100.0000 mg | ORAL_TABLET | Freq: Every day | ORAL | 2 refills | Status: DC

## 2017-04-06 NOTE — Patient Instructions (Addendum)
Call for eye exam

## 2017-04-06 NOTE — Progress Notes (Signed)
  Subjective:   Patient ID: Andrew [name], male    DOB: 11-Feb-1951, 66 y.o.   MRN: [id number] CC: Follow-up (3 month)  HPI: Andrew [name] is a 66 y.o. male presenting for Follow-up (3 month)  Here today with friend Andrew [name]  Recent upper respiratory infection and antibiotics for pneumonia about a week ago Doing much better now No fevers Hasnt smoked for past two weeks due to recent illness  DM2: low 200s in the morning past few days 100s in the afternoon Taking meds regularly, no trouble getting them now Increased appetite for sugary foods since being sick  Has a corn on his right foot that bothers him Has had ulcers on 4th toes several years ago requiring surgery Does have decreased sensation on the bottoms of his foot No recent ulcers, cuts on the bottoms of his feet   Relevant past medical, surgical, family and social history reviewed. Allergies and medications reviewed and updated. Social History   Tobacco Use  Smoking Status Current Every Day Smoker  . Packs/day: 1.00  . Years: 42.00  . Pack years: 42.00  . Types: Cigarettes  Smokeless Tobacco Never Used   ROS: Per HPI   Objective:    BP 131/78   Pulse 95   Temp 97.9 F (36.6 C) (Oral)   Ht 6\' 4"  (1.93 m)   Wt 216 lb (98 kg)   BMI 26.29 kg/m   Wt Readings from Last 3 Encounters:  04/06/17 216 lb (98 kg)  03/28/17 209 lb (94.8 kg)  03/14/17 216 lb (98 kg)   Gen: NAD, alert, cooperative with exam, NCAT EYES: EOMI, no conjunctival injection, or no icterus ENT: OP without erythema LYMPH: no cervical LAD CV: NRRR, normal S1/S2, no murmur, distal pulses 2+ b/l Resp: CTABL, no wheezes, normal WOB Abd: +BS, soft, NTND. Ext: No edema, warm Neuro: Alert and oriented, strength equal b/l UE and LE, coordination grossly normal, decreased sensation to monofilament on the bottoms of his feet, see separate foot exam documentation Skin: apprx 1 cm callus distal lateral side of R foot  Assessment & Plan:  [place] was  seen today for follow-up.  Diagnoses and all orders for this visit:  Type 2 diabetes mellitus without complication, without long-term current use of insulin (HCC) A1c 8.4 Improved from last check Recently required steroids, is taking his medicines regularly now, no trouble getting them Cont meds, check blood sugar levels regularly at home -     Bayer DCA Hb A1c Waived -     sitaGLIPtin (JANUVIA) 100 MG tablet; Take 1 tablet (100 mg total) daily by mouth.  Hyperlipidemia, unspecified hyperlipidemia type Stable, continue statin  Callus of foot Refer to podiatry  Follow up plan: 3 mo Assunta Found, MD [place]za

## 2017-04-08 ENCOUNTER — Ambulatory Visit: Payer: Self-pay

## 2017-04-15 ENCOUNTER — Other Ambulatory Visit: Payer: Self-pay | Admitting: Pediatrics

## 2017-04-15 DIAGNOSIS — R609 Edema, unspecified: Secondary | ICD-10-CM

## 2017-04-21 ENCOUNTER — Other Ambulatory Visit: Payer: Self-pay

## 2017-04-21 NOTE — Patient Outreach (Signed)
[place] [facility]) Care Management  [place]  04/21/2017   Andrew [name] [birth year] [id number]  Subjective: Telephone call to patient for monthly assessment. HIPAA verified. The patient stated that he has been sick with an upper respiratory infection.  He has seen his physician.  The patient had his a1c checked and it has come down from 9.3 to  8.4. RN Health Coach congratulated him on his progress. He has not checked his blood sugar this morning.  The patient stated that he is having some pain today he rated it about a 2.  He is being adherent with his medication.  The patient states that he is still smoking but cut it down from a 1 1/2 pack a day to 2 cigarettes a day. RN Health Coach dicussed the effects cigarettes has with his lungs and diabetes.  He verbalized understanding.  Objective:   Encounter Medications:  Outpatient Encounter Medications as of 04/21/2017  Medication Sig Note  . aspirin 81 MG tablet Take 81 mg by mouth daily. 12/30/2016: Holding for 5 days prior to surgery.  . dapagliflozin propanediol (FARXIGA) 10 MG TABS tablet Take 10 mg by mouth daily.   . furosemide (LASIX) 40 MG tablet TAKE 1 TABLET ONCE DAILY   . glimepiride (AMARYL) 4 MG tablet TAKE 2 TABLETS ONCE DAILY WITH BREAKFAST   . metFORMIN (GLUCOPHAGE) 1000 MG tablet Take 1 tablet (1,000 mg total) by mouth 2 (two) times daily with a meal. 01/25/2017: PRN only  . naproxen sodium (ANAPROX) 220 MG tablet Take 440-660 mg by mouth 2 (two) times daily as needed (pain).   Marland Kitchen omeprazole (PRILOSEC) 20 MG capsule TAKE (1) CAPSULE DAILY   . pravastatin (PRAVACHOL) 80 MG tablet Take 1 tablet (80 mg total) by mouth daily.   . primidone (MYSOLINE) 50 MG tablet Take 1 tablet (50 mg total) by mouth daily.   . sitaGLIPtin (JANUVIA) 100 MG tablet Take 1 tablet (100 mg total) daily by mouth.   . levofloxacin (LEVAQUIN) 500 MG tablet Take 1 tablet (500 mg total) daily by mouth. (Patient not taking: Reported on  04/21/2017)    No facility-administered encounter medications on file as of 04/21/2017.     Functional Status:  In your present state of health, do you have any difficulty performing the following activities: 01/17/2017 01/03/2017  Hearing? N N  Vision? N N  Difficulty concentrating or making decisions? N N  Walking or climbing stairs? Y N  Dressing or bathing? N N  Doing errands, shopping? N N  Preparing Food and eating ? N -  Using the Toilet? N -  In the past six months, have you accidently leaked urine? N -  Do you have problems with loss of bowel control? N -  Managing your Medications? N -  Managing your Finances? N -  Housekeeping or managing your Housekeeping? Y -  Some recent data might be hidden    Fall/Depression Screening: Fall Risk  04/21/2017 04/06/2017 03/28/2017  Falls in the past year? No No No  Number falls in past yr: - - -  Injury with Fall? - - -  Risk Factor Category  - - -  Risk for fall due to : - - -  Follow up - - -   PHQ 2/9 Scores 04/21/2017 04/06/2017 03/28/2017 03/14/2017 03/08/2017 02/14/2017 01/17/2017  PHQ - 2 Score 2 0 2 2 3 1  0  PHQ- 9 Score 4 - 6 6 8  - -    Assessment: Patient continues  to benefit from health coach outreach for disease management and support.   THN CM Care Plan Problem One     Most Recent Value  Care Plan Problem One  Increased A1c  Role Documenting the Problem One  [place] for Problem One  Active  THN Long Term Goal   Patient will decrease A1c by 0.2 points within the next 90 days.  THN Long Term Goal Start Date  02/14/17  Interventions for Problem One Long Term Goal  RN Health Coach congradulated the patient on lowering his a1c from 9.3 to 8.4 and reinterated for him to continue steps  THN CM Short Term Goal #1   Patient will be able to report maintaining low carbohydrate diet within 30 days.   THN CM Short Term Goal #1 Start Date  02/14/17  Interventions for Short Term Goal #1  RN Health Coach reivewed  with the patient about carbohydrates   THN CM Short Term Goal #2   Patient will be able to report increase in activity in the next 30 days.  THN CM Short Term Goal #2 Start Date  02/14/17  Interventions for Short Term Goal #2  RN Health Coach revieweded with patient importance of activity to lower blood sugar.      THN CM Care Plan Problem Two     Most Recent Value  Care Plan Problem Two   Smoking  Role Documenting the Problem [place] for Problem Two  Active  Interventions for Problem Two Long Term Goal   RN Health Coach information on the effects smking can cause with diabetes  THN Long Term Goal  In 90 days the patient will verbalize decreaeing smoking 2 cigarettes a day to one cigarette a   THN Long Term Goal Start Date  04/21/17     Plan: RN Health Coach will contact patient in the month of December and patient agrees to next outreach.  Lazaro Arms RN, BSN, [place] Direct Dial:  [phone] Fax: [phone]

## 2017-04-27 ENCOUNTER — Ambulatory Visit (INDEPENDENT_AMBULATORY_CARE_PROVIDER_SITE_OTHER): Payer: Medicare Other | Admitting: Urology

## 2017-04-27 DIAGNOSIS — C671 Malignant neoplasm of dome of bladder: Secondary | ICD-10-CM

## 2017-05-19 DIAGNOSIS — L84 Corns and callosities: Secondary | ICD-10-CM | POA: Diagnosis not present

## 2017-05-19 DIAGNOSIS — M79671 Pain in right foot: Secondary | ICD-10-CM | POA: Diagnosis not present

## 2017-05-19 DIAGNOSIS — E1142 Type 2 diabetes mellitus with diabetic polyneuropathy: Secondary | ICD-10-CM | POA: Diagnosis not present

## 2017-05-19 DIAGNOSIS — B351 Tinea unguium: Secondary | ICD-10-CM | POA: Diagnosis not present

## 2017-05-20 ENCOUNTER — Other Ambulatory Visit: Payer: Self-pay

## 2017-05-20 NOTE — Patient Outreach (Signed)
Andrew [name]) Care Management  Andrew [name]  05/20/2017   Andrew [name] 05-Jun-1950 [id number]  Subjective: Telephone call placed to the patient for monthly assessment. HIPAA verified. The patient states that he is doing well.  He has had some pain in his knee due to the weather.  He rates it a 5/10.  The patient states that his blood sugars have been ranging in the 200's since Thanksgiving.  He did not check his blood sugar today but yesterday it was 164.  Dicussed with the patient about diet and exercise and he feels that through the holidays he may have eaten more and due to the weather not able to exercise like he was. [facility] asked the patient to monitor his intake and watch the carbohydrates.  Patient verbalized understanding.   The patient saw his podiatrist yesterday and needs to get his a1c lowered before anything will be done to his foot. He stated that he has an appointment with his primary in February.  The patient states that he is smoking a 1/2 pack of cigarettes a day. [name] asked the patient to discuss some other options to quit smoking with his physician. Patient verbalized understanding.   Objective:   Encounter Medications:  Outpatient Encounter Medications as of 05/20/2017  Medication Sig Note  . aspirin 81 MG tablet Take 81 mg by mouth daily. 12/30/2016: Holding for 5 days prior to surgery.  . dapagliflozin propanediol (FARXIGA) 10 MG TABS tablet Take 10 mg by mouth daily.   . furosemide (LASIX) 40 MG tablet TAKE 1 TABLET ONCE DAILY   . glimepiride (AMARYL) 4 MG tablet TAKE 2 TABLETS ONCE DAILY WITH BREAKFAST   . metFORMIN (GLUCOPHAGE) 1000 MG tablet Take 1 tablet (1,000 mg total) by mouth 2 (two) times daily with a meal. 01/25/2017: PRN only  . naproxen sodium (ANAPROX) 220 MG tablet Take 440-660 mg by mouth 2 (two) times daily as needed (pain).   Marland Kitchen omeprazole (PRILOSEC) 20 MG capsule TAKE (1) CAPSULE DAILY   . pravastatin (PRAVACHOL) 80 MG tablet Take 1  tablet (80 mg total) by mouth daily.   . primidone (MYSOLINE) 50 MG tablet Take 1 tablet (50 mg total) by mouth daily.   . sitaGLIPtin (JANUVIA) 100 MG tablet Take 1 tablet (100 mg total) daily by mouth.   . levofloxacin (LEVAQUIN) 500 MG tablet Take 1 tablet (500 mg total) daily by mouth. (Patient not taking: Reported on 04/21/2017)    No facility-administered encounter medications on file as of 05/20/2017.     Functional Status:  In your present state of health, do you have any difficulty performing the following activities: 01/17/2017 01/03/2017  Hearing? N N  Vision? N N  Difficulty concentrating or making decisions? N N  Walking or climbing stairs? Y N  Dressing or bathing? N N  Doing errands, shopping? N N  Preparing Food and eating ? N -  Using the Toilet? N -  In the past six months, have you accidently leaked urine? N -  Do you have problems with loss of bowel control? N -  Managing your Medications? N -  Managing your Finances? N -  Housekeeping or managing your Housekeeping? Y -  Some recent data might be hidden    Fall/Depression Screening: Fall Risk  05/20/2017 04/21/2017 04/06/2017  Falls in the past year? No No No  Number falls in past yr: - - -  Injury with Fall? - - -  Risk Factor Category  - - -  Risk for fall due to : - - -  Follow up - - -   PHQ 2/9 Scores 04/21/2017 04/06/2017 03/28/2017 03/14/2017 03/08/2017 02/14/2017 01/17/2017  PHQ - 2 Score 2 0 2 2 3 1  0  PHQ- 9 Score 4 - 6 6 8  - -    Assessment: Patient continues to benefit from health coach outreach for disease management and support.   THN CM Care Plan Problem One     Most Recent Value  Care Plan Problem One  Increased A1c  Role Documenting the Problem One  [place] for Problem One  Active  THN Long Term Goal   Patient will decrease A1c by 0.2 points within the next 90 days.  THN Long Term Goal Start Date  02/14/17  Interventions for Problem One [place]  discussed with the patient to continue monitoing his diet to lower his a1c.  THN CM Short Term Goal #1   Patient will be able to report maintaining low carbohydrate diet within 30 days.   THN CM Short Term Goal #1 Start Date  02/14/17  Interventions for Short Term Goal #1  RN Health Coach went over carbs that he needs to be aware of in his diet.  THN CM Short Term Goal #2   Patient will be able to report increase in activity in the next 30 days.  THN CM Short Term Goal #2 Start Date  02/14/17  Interventions for Short Term Goal #2  [place] discussed other alternatives with the patient for exercise    [facility] CM Care Plan Problem Two     Most Recent Value  Care Plan Problem Two   Smoking  Role Documenting the Problem Two  [place] for Problem Two  Active  Interventions for Problem Two Long Term Goal   RN Health Coach discussed other methods with the patient to help with decreasing smoking  THN Long Term Goal  In 90 days the patient will verbalize decreasing smoking 1/2 pk cigarettes a day to 1/2 pk cigarettes ievery three days  [facility] Long Term Goal Start Date  04/21/17     Plan:  [place] will contact patient in the month of January and patient agrees to next outreach.  Lazaro Arms RN, BSN, [place] Direct Dial:  [phone] Fax: [phone]

## 2017-05-20 NOTE — Progress Notes (Signed)
This encounter was created in error - please disregard.

## 2017-06-01 ENCOUNTER — Other Ambulatory Visit: Payer: Self-pay | Admitting: Pharmacy Technician

## 2017-06-01 ENCOUNTER — Encounter: Payer: Self-pay | Admitting: Pharmacy Technician

## 2017-06-01 NOTE — Patient Outreach (Signed)
[facility]) Care Management  06/01/2017  Andrew [name] November 06, 1950 [id number]  Successful patient outreach call in reference to [employer] patient assistance program. HIPAA identifiers verified and verbal consent received. We previously tried to get patient approved for Januvia through [employer] last year but he did not qualify. Due to our previous discussion I think he will qualify this year and called to see if he was interested in reapplying. The patient wants to apply and I will be mailing him the application tomorrow for completion.  [name], [place] [phone]

## 2017-06-08 ENCOUNTER — Encounter: Payer: Self-pay | Admitting: Pharmacy Technician

## 2017-06-08 NOTE — Patient Outreach (Signed)
[place] [facility]) Care Management  06/08/2017  Andrew [name] 06/01/1950 [id number]   Erroneous error, did not intend to open encounter.  [name], [place] [phone]

## 2017-06-20 ENCOUNTER — Telehealth: Payer: Self-pay | Admitting: Pediatrics

## 2017-06-20 ENCOUNTER — Other Ambulatory Visit: Payer: Self-pay

## 2017-06-20 ENCOUNTER — Other Ambulatory Visit: Payer: Self-pay | Admitting: Pharmacy Technician

## 2017-06-20 NOTE — Telephone Encounter (Signed)
Notified THN that form has been mailed

## 2017-06-20 NOTE — Patient Outreach (Signed)
[place] [facility]) Care Management  06/20/2017  Andrew [name] [birth year] [id number]  Follow-up call in reference to [employer] patient assistance application for Januvia that was mailed to the office 06/07/2017. I have not received the application back as of yet and called to make sure it was received. I left a message with the receptionist and she will have the nurse of Dr. Evette Doffing to contact me.  [name], [place] [phone]

## 2017-06-20 NOTE — Patient Outreach (Deleted)
[facility]) Care Management  06/20/2017  Andrew [name] 01-May-1951 [id number]

## 2017-06-20 NOTE — Patient Outreach (Signed)
[facility]) Care Management  06/20/2017  Andrew [name] 10-14-1950 [id number]   Telephone call placed to the patient for monthly assessment.  The patient answered the phone and stated that he was on the road at the moment and unable to speak with me.  Plan: RN Health Coach will make an outreach attempt to the patient in the month of February.  Lazaro Arms RN, BSN, [place] Direct Dial:  [phone] Fax: [phone]

## 2017-06-24 ENCOUNTER — Other Ambulatory Visit: Payer: Self-pay

## 2017-06-24 NOTE — Patient Outreach (Signed)
[place] [facility]) Care Management  06/24/2017  Ronel Rodeheaver [birth year] [id number]   2nd call to the patient for monthly assessment. The patient answered the phone and stated that he is traveling home from a funeral of a family member. He asked that I give him a call at a later date.  Plan: RN Health Coach will make an outreach attempt to the patient in the month of February.  Lazaro Arms RN, BSN, [place] Direct Dial:  [phone] Fax: [phone]

## 2017-07-01 ENCOUNTER — Telehealth: Payer: Self-pay | Admitting: Pediatrics

## 2017-07-01 ENCOUNTER — Other Ambulatory Visit: Payer: Self-pay | Admitting: Pharmacy Technician

## 2017-07-01 NOTE — Telephone Encounter (Signed)
Samples up front - pt aware  ?

## 2017-07-01 NOTE — Patient Outreach (Signed)
[place]) Care Management  07/01/2017  Andrew [name] [birth year] [id number]  Unsuccessful patient outreach call in reference to patient assistance application mailed last month. HIPAA compliant voicemail was left for the patient to contact me. I have received the provider's portion of the [employer] application for [employer]. Once I receive the patient's portion I will be able to mail the completed application to [employer] for evaluation.  [name], [place] [phone]

## 2017-07-04 ENCOUNTER — Other Ambulatory Visit: Payer: Self-pay

## 2017-07-04 NOTE — Patient Outreach (Signed)
[place] [facility]) Care Management  [place]  07/04/2017   Yazan Gatling 1950/08/25 [id number]  Subjective: Telephone call to the patient for monthly assessment. HIPAA verified. The patient states that he is ok but is having a hard time regaining his energy.  He had to travel last week for a funeral.  He states that he is having some chronic back pain that he rates a 5/10.  He states that he is adherent with his medications.  He has been trying to follow his diabetic diet the best he can with eating out because of the travel.  The patient states that his blood sugars are fluctuating from 99 to 170.  He stated that while riding his horse it became spooked and threw him about fifteen feet.  He landed in a manure pile.  He states that he was sore and had some bruises but not any major injury.  The patient has an appointment with his primary care this Thursday 07/07/2017.  Objective:   Encounter Medications:  Outpatient Encounter Medications as of 07/04/2017  Medication Sig Note  . aspirin 81 MG tablet Take 81 mg by mouth daily. 12/30/2016: Holding for 5 days prior to surgery.  . dapagliflozin propanediol (FARXIGA) 10 MG TABS tablet Take 10 mg by mouth daily.   . furosemide (LASIX) 40 MG tablet TAKE 1 TABLET ONCE DAILY   . glimepiride (AMARYL) 4 MG tablet TAKE 2 TABLETS ONCE DAILY WITH BREAKFAST   . metFORMIN (GLUCOPHAGE) 1000 MG tablet Take 1 tablet (1,000 mg total) by mouth 2 (two) times daily with a meal. 01/25/2017: PRN only  . naproxen sodium (ANAPROX) 220 MG tablet Take 440-660 mg by mouth 2 (two) times daily as needed (pain).   Marland Kitchen omeprazole (PRILOSEC) 20 MG capsule TAKE (1) CAPSULE DAILY   . pravastatin (PRAVACHOL) 80 MG tablet Take 1 tablet (80 mg total) by mouth daily.   . primidone (MYSOLINE) 50 MG tablet Take 1 tablet (50 mg total) by mouth daily.   . sitaGLIPtin (JANUVIA) 100 MG tablet Take 1 tablet (100 mg total) daily by mouth.   . levofloxacin (LEVAQUIN) 500 MG  tablet Take 1 tablet (500 mg total) daily by mouth. (Patient not taking: Reported on 04/21/2017)    No facility-administered encounter medications on file as of 07/04/2017.     Functional Status:  In your present state of health, do you have any difficulty performing the following activities: 01/17/2017 01/03/2017  Hearing? N N  Vision? N N  Difficulty concentrating or making decisions? N N  Walking or climbing stairs? Y N  Dressing or bathing? N N  Doing errands, shopping? N N  Preparing Food and eating ? N -  Using the Toilet? N -  In the past six months, have you accidently leaked urine? N -  Do you have problems with loss of bowel control? N -  Managing your Medications? N -  Managing your Finances? N -  Housekeeping or managing your Housekeeping? Y -  Some recent data might be hidden    Fall/Depression Screening: Fall Risk  07/04/2017 05/20/2017 04/21/2017  Falls in the past year? Yes No No  Number falls in past yr: (No Data) - -  Comment PAtient was thrown from his horse since I last spoke with him - -  Injury with Fall? No - -  Comment Patient states that he was bruised and sore - -  Risk Factor Category  - - -  Risk for fall due to : - - -  Follow up - - -   PHQ 2/9 Scores 04/21/2017 04/06/2017 03/28/2017 03/14/2017 03/08/2017 02/14/2017 01/17/2017  PHQ - 2 Score 2 0 2 2 3 1  0  PHQ- 9 Score 4 - 6 6 8  - -    Assessment: Patient continues to benefit from health coach outreach for disease management and support.    THN CM Care Plan Problem One     Most Recent Value  Care Plan Problem One  Increased A1c  Role Documenting the Problem One  [place] for Problem One  Active  THN Long Term Goal   Patient will decrease A1c by 0.2 points within the next 90 days.  THN Long Term Goal Start Date  07/04/17  Interventions for Problem One [place] Talked with the patient about goal of lowering his a1c  THN CM Short Term Goal #1   Patient will be  able to report maintaining low carbohydrate diet within 30 days.   THN CM Short Term Goal #1 Start Date  02/14/17  Interventions for Short Term Goal #1  RN Health Coach talked about the patients diet   THN CM Short Term Goal #2   Patient will be able to report increase in activity in the next 30 days.  THN CM Short Term Goal #2 Start Date  07/04/17  Interventions for Short Term Goal #2  [place] discussed other alternatives with the patient for exercise  THN CM Short Term Goal #3  In the next 30 days the patient willverbalize that he has decrease smoking to a 1/2 pack every  three days  [facility] CM Short Term Goal #3 Start Date  07/04/17  Interventions for Short [place] Goal #3  RN Health coach talked with the -atient about smoking affects diabetes.       Plan: RN Health Coach will contact patient in the month of March and patient agrees to next outreach.

## 2017-07-05 ENCOUNTER — Other Ambulatory Visit: Payer: Self-pay | Admitting: Pharmacy Technician

## 2017-07-05 NOTE — Patient Outreach (Signed)
[place] [facility]) Care Management  07/05/2017  Andrew [name] 06-15-1950 [id number]  Unsuccessful patient outreach call in reference to [employer] patient assistance application for [place]. HIPAA compliant voicemail left requesting for the patient to contact me if he wants to proceed with the application process in the future. I am proceeding with case closure.  [name], [place] [phone]

## 2017-07-07 ENCOUNTER — Encounter: Payer: Self-pay | Admitting: Pediatrics

## 2017-07-07 ENCOUNTER — Encounter: Payer: Self-pay | Admitting: Gastroenterology

## 2017-07-07 ENCOUNTER — Ambulatory Visit (INDEPENDENT_AMBULATORY_CARE_PROVIDER_SITE_OTHER): Payer: Medicare Other | Admitting: Pediatrics

## 2017-07-07 VITALS — BP 131/82 | HR 90 | Temp 97.5°F | Ht 76.0 in | Wt 216.4 lb

## 2017-07-07 DIAGNOSIS — G250 Essential tremor: Secondary | ICD-10-CM

## 2017-07-07 DIAGNOSIS — R195 Other fecal abnormalities: Secondary | ICD-10-CM

## 2017-07-07 DIAGNOSIS — K219 Gastro-esophageal reflux disease without esophagitis: Secondary | ICD-10-CM | POA: Diagnosis not present

## 2017-07-07 DIAGNOSIS — E119 Type 2 diabetes mellitus without complications: Secondary | ICD-10-CM | POA: Diagnosis not present

## 2017-07-07 DIAGNOSIS — R350 Frequency of micturition: Secondary | ICD-10-CM

## 2017-07-07 DIAGNOSIS — Z1211 Encounter for screening for malignant neoplasm of colon: Secondary | ICD-10-CM | POA: Diagnosis not present

## 2017-07-07 DIAGNOSIS — K5900 Constipation, unspecified: Secondary | ICD-10-CM | POA: Diagnosis not present

## 2017-07-07 DIAGNOSIS — R609 Edema, unspecified: Secondary | ICD-10-CM

## 2017-07-07 LAB — BAYER DCA HB A1C WAIVED: HB A1C (BAYER DCA - WAIVED): 7.7 % — ABNORMAL HIGH (ref <7.0)

## 2017-07-07 MED ORDER — DAPAGLIFLOZIN PROPANEDIOL 10 MG PO TABS: 10.0000 mg | ORAL_TABLET | Freq: Every day | ORAL | 4 refills | Status: DC

## 2017-07-07 MED ORDER — SITAGLIPTIN PHOSPHATE 100 MG PO TABS: 100.0000 mg | ORAL_TABLET | Freq: Every day | ORAL | 2 refills | Status: DC

## 2017-07-07 MED ORDER — PRIMIDONE 50 MG PO TABS: 50.0000 mg | ORAL_TABLET | Freq: Every day | ORAL | 1 refills | Status: DC

## 2017-07-07 MED ORDER — GLIMEPIRIDE 4 MG PO TABS: ORAL_TABLET | ORAL | 1 refills | Status: DC

## 2017-07-07 MED ORDER — OMEPRAZOLE 20 MG PO CPDR
DELAYED_RELEASE_CAPSULE | ORAL | 1 refills | Status: DC
Start: 2017-07-07 — End: 2017-11-03

## 2017-07-07 MED ORDER — FUROSEMIDE 20 MG PO TABS: 20.0000 mg | ORAL_TABLET | Freq: Every day | ORAL | 1 refills | Status: DC | PRN

## 2017-07-07 MED ORDER — METFORMIN HCL 1000 MG PO TABS: 1000.0000 mg | ORAL_TABLET | Freq: Two times a day (BID) | ORAL | 3 refills | Status: DC

## 2017-07-07 NOTE — Progress Notes (Signed)
Subjective:   Patient ID: Andrew [name], male    DOB: 1950-06-17, 67 y.o.   MRN: [id number] CC: Follow-up (3 month)  HPI: Andrew [name] is a 67 y.o. male presenting for Follow-up (3 month)  DM2: eating soup, vegetables, meat sometimes. Avoiding carbohydrates. AM BGLs 160s-170s, before dinner 90s-130s. Taking meds regularly.  GERD: takes PPI most days, has s/e if misses 2 days in a row. Has bene on lots of prns in the past, such as rolaids, tums.  Has had a dry mouth at times. Takes lasix 74m in the morning. No swelling recently. Urinating frequently at night. Takes lasix in the morning.  Episode a few weeks ago of dark colored stools x 2 weeks. Resolved. No frank blood in stools. Not using peptobismal/iron, no change in eating habits.  Had negative FIT test 09/2016. [place], he says there were a few polyps but was told routine screening. Feels bloated in upper abd at times. Sometimes goes 3-5 days between stools. Taking 4-5 ducolax, used to have large amount loose stool afterwards, now with some movement but not as much as he would expect. Appetite has been fine. Eating diet with lots of vegetables and drinking lots of water daily.   feeling tired much of the time. Finds himself sleeping more than usual past few weeks. Sister-in-law died after year long illness, he says he and family have been emotionally worn out. He was on bipap in the past, not using machine now, thinks he needs new one. Was referred to sleep medicine last year, had to reschedule several times and now pt wants to wait for new referral.   Relevant past medical, surgical, family and social history reviewed. Allergies and medications reviewed and updated. Social History   Tobacco Use  Smoking Status Current Every Day Smoker  . Packs/day: 1.00  . Years: 42.00  . Pack years: 42.00  . Types: Cigarettes  Smokeless Tobacco Never Used   ROS: Per HPI   Objective:    BP 131/82    Pulse 90   Temp (!) 97.5 F (36.4 C) (Oral)   Ht '6\' 4"'  (1.93 m)   Wt 216 lb 6.4 oz (98.2 kg)   BMI 26.34 kg/m   Wt Readings from Last 3 Encounters:  07/07/17 216 lb 6.4 oz (98.2 kg)  04/06/17 216 lb (98 kg)  03/28/17 209 lb (94.8 kg)    Gen: NAD, alert, cooperative with exam, NCAT EYES: EOMI, no conjunctival injection, or no icterus ENT:  TMs pearly gray b/l, OP without erythema LYMPH: no cervical LAD CV: NRRR, normal S1/S2, no murmur, distal pulses 2+ b/l Resp: CTABL, no wheezes, normal WOB Abd: +BS, soft, NT, mildly distended. no guarding or organomegaly Ext: No edema, warm Neuro: Alert and oriented, strength equal b/l UE and LE, coordination grossly normal MSK: normal muscle bulk  Assessment & Plan:  Andrew [name] seen today for follow-up med problems.  Diagnoses and all orders for this visit:  Type 2 diabetes mellitus without complication, without long-term current use of insulin (HCC) A1c 7.7, slightly improved. Cont below medications -     Bayer DCA Hb A1c Waived -     dapagliflozin propanediol (FARXIGA) 10 MG TABS tablet; Take 10 mg by mouth daily. -     glimepiride (AMARYL) 4 MG tablet; TAKE 2 TABLETS ONCE DAILY WITH BREAKFAST -     metFORMIN (GLUCOPHAGE) 1000 MG tablet; Take 1 tablet (1,000 mg total) by mouth 2 (two) times daily  with a meal. -     sitaGLIPtin (JANUVIA) 100 MG tablet; Take 1 tablet (100 mg total) by mouth daily. -     CMP14+EGFR  Gastroesophageal reflux disease, esophagitis presence not specified Stable, cont below -     omeprazole (PRILOSEC) 20 MG capsule; TAKE (1) CAPSULE DAILY  Essential tremor Stable, cont below -     primidone (MYSOLINE) 50 MG tablet; Take 1 tablet (50 mg total) by mouth daily.  Frequent urination Decreasing lasix, if still ongoing may benefit for BPH treatment -     PSA, total and free  Dark stools -     CBC with Differential/Platelet  Screen for colon cancer -     Ambulatory referral to  Gastroenterology  Swelling Decrease to 58m prn daily. -     furosemide (LASIX) 20 MG tablet; Take 1 tablet (20 mg total) by mouth daily as needed.  Constipation, unspecified constipation type miralax every morning, can repeat in 2 hours if no effect  Fatigue Likely multifactorial, discussed importance of treating sleep apnea, lots of family stress recently as well.   Follow up plan: No Follow-up on file. CAssunta Found MD [place]

## 2017-07-08 LAB — CBC WITH DIFFERENTIAL/PLATELET
Basophils Absolute: 0 10*3/uL (ref 0.0–0.2)
Basos: 0 %
EOS (ABSOLUTE): 0.6 10*3/uL — ABNORMAL HIGH (ref 0.0–0.4)
Eos: 6 %
HEMOGLOBIN: 17.7 g/dL (ref 13.0–17.7)
Hematocrit: 50.4 % (ref 37.5–51.0)
IMMATURE GRANULOCYTES: 1 %
Immature Grans (Abs): 0.1 10*3/uL (ref 0.0–0.1)
Lymphocytes Absolute: 2.8 10*3/uL (ref 0.7–3.1)
Lymphs: 29 %
MCH: 33 pg (ref 26.6–33.0)
MCHC: 35.1 g/dL (ref 31.5–35.7)
MCV: 94 fL (ref 79–97)
MONOS ABS: 0.8 10*3/uL (ref 0.1–0.9)
Monocytes: 8 %
NEUTROS PCT: 56 %
Neutrophils Absolute: 5.5 10*3/uL (ref 1.4–7.0)
Platelets: 195 10*3/uL (ref 150–379)
RBC: 5.37 x10E6/uL (ref 4.14–5.80)
RDW: 13.6 % (ref 12.3–15.4)
WBC: 9.7 10*3/uL (ref 3.4–10.8)

## 2017-07-08 LAB — CMP14+EGFR
ALBUMIN: 4.6 g/dL (ref 3.6–4.8)
ALT: 17 IU/L (ref 0–44)
AST: 13 IU/L (ref 0–40)
Albumin/Globulin Ratio: 1.6 (ref 1.2–2.2)
Alkaline Phosphatase: 74 IU/L (ref 39–117)
BUN / CREAT RATIO: 14 (ref 10–24)
BUN: 12 mg/dL (ref 8–27)
Bilirubin Total: 0.3 mg/dL (ref 0.0–1.2)
CALCIUM: 10 mg/dL (ref 8.6–10.2)
CO2: 21 mmol/L (ref 20–29)
CREATININE: 0.84 mg/dL (ref 0.76–1.27)
Chloride: 99 mmol/L (ref 96–106)
GFR calc Af Amer: 105 mL/min/{1.73_m2} (ref >59)
GFR, EST NON AFRICAN AMERICAN: 91 mL/min/{1.73_m2} (ref >59)
GLOBULIN, TOTAL: 2.8 g/dL (ref 1.5–4.5)
Glucose: 194 mg/dL — ABNORMAL HIGH (ref 65–99)
Potassium: 4.7 mmol/L (ref 3.5–5.2)
SODIUM: 139 mmol/L (ref 134–144)
Total Protein: 7.4 g/dL (ref 6.0–8.5)

## 2017-07-08 LAB — PSA, TOTAL AND FREE
PSA, Free Pct: 26.7 %
PSA, Free: 0.16 ng/mL
Prostate Specific Ag, Serum: 0.6 ng/mL (ref 0.0–4.0)

## 2017-07-22 ENCOUNTER — Other Ambulatory Visit: Payer: Self-pay | Admitting: Pediatrics

## 2017-07-22 ENCOUNTER — Telehealth: Payer: Self-pay | Admitting: Pediatrics

## 2017-07-22 NOTE — Telephone Encounter (Signed)
Samples ready for pick up

## 2017-07-26 DIAGNOSIS — E1142 Type 2 diabetes mellitus with diabetic polyneuropathy: Secondary | ICD-10-CM | POA: Diagnosis not present

## 2017-07-26 DIAGNOSIS — M79671 Pain in right foot: Secondary | ICD-10-CM | POA: Diagnosis not present

## 2017-07-26 DIAGNOSIS — L84 Corns and callosities: Secondary | ICD-10-CM | POA: Diagnosis not present

## 2017-08-04 ENCOUNTER — Other Ambulatory Visit: Payer: Self-pay

## 2017-08-04 NOTE — Patient Outreach (Signed)
[facility]) Care Management  [place]  08/04/2017   Andrew [name] April 02, 1951 [id number]  Subjective: Telephone cal to the patient for monthly assessment. HIPAA verified.  The patient states that he is doing well.  He states that he has chronic pain and her rates it at a 3/10.  He has not had any falls since the last time that I spoke with him.   He had an appointment with Dr. Evette Doffing on 07/07/2017 and his a1c has come down from 8.4 to 7.7.  He states that he is being cognizant of his food and fluid intake.  He gets exercise from working in his yard and his animals. The patient will follow up with his physician in 3 months.   Encounter Medications:  Outpatient Encounter Medications as of 08/04/2017  Medication Sig Note  . aspirin 81 MG tablet Take 81 mg by mouth daily. 12/30/2016: Holding for 5 days prior to surgery.  . dapagliflozin propanediol (FARXIGA) 10 MG TABS tablet Take 10 mg by mouth daily.   . furosemide (LASIX) 20 MG tablet Take 1 tablet (20 mg total) by mouth daily as needed.   . furosemide (LASIX) 40 MG tablet TAKE 1 TABLET ONCE DAILY   . glimepiride (AMARYL) 4 MG tablet TAKE 2 TABLETS ONCE DAILY WITH BREAKFAST   . metFORMIN (GLUCOPHAGE) 1000 MG tablet Take 1 tablet (1,000 mg total) by mouth 2 (two) times daily with a meal.   . naproxen sodium (ANAPROX) 220 MG tablet Take 440-660 mg by mouth 2 (two) times daily as needed (pain).   Marland Kitchen omeprazole (PRILOSEC) 20 MG capsule TAKE (1) CAPSULE DAILY   . pravastatin (PRAVACHOL) 80 MG tablet Take 1 tablet (80 mg total) by mouth daily.   . primidone (MYSOLINE) 50 MG tablet Take 1 tablet (50 mg total) by mouth daily.   . sitaGLIPtin (JANUVIA) 100 MG tablet Take 1 tablet (100 mg total) by mouth daily.    No facility-administered encounter medications on file as of 08/04/2017.     Functional Status:  In your present state of health, do you have any difficulty performing the following activities: 01/17/2017 01/03/2017   Hearing? N N  Vision? N N  Difficulty concentrating or making decisions? N N  Walking or climbing stairs? Y N  Dressing or bathing? N N  Doing errands, shopping? N N  Preparing Food and eating ? N -  Using the Toilet? N -  In the past six months, have you accidently leaked urine? N -  Do you have problems with loss of bowel control? N -  Managing your Medications? N -  Managing your Finances? N -  Housekeeping or managing your Housekeeping? Y -  Some recent data might be hidden    Fall/Depression Screening: Fall Risk  08/04/2017 07/07/2017 07/04/2017  Falls in the past year? No Yes Yes  Number falls in past yr: - 1 (No Data)  Comment - - PAtient was thrown from his horse since I last spoke with him  Injury with Fall? - No No  Comment - - Patient states that he was bruised and sore  Risk Factor Category  - - -  Risk for fall due to : - - -  Follow up - - -   PHQ 2/9 Scores 07/07/2017 04/21/2017 04/06/2017 03/28/2017 03/14/2017 03/08/2017 02/14/2017  PHQ - 2 Score 0 2 0 '2 2 3 1  ' PHQ- 9 Score - 4 - '6 6 8 ' -    Assessment: Patient continues  to benefit from health coach outreach for disease management and support.    THN CM Care Plan Problem One     Most Recent Value  THN Long Term Goal   Patient will decrease A1c by 0.2 points within the next 90 days.  THN Long Term Goal Start Date  08/04/17  Interventions for Problem One [place] discussed with the patient about his food intake.  THN CM Short Term Goal #1   Patient will be able to report maintaining low carbohydrate diet within 30 days.   THN CM Short Term Goal #1 Start Date  08/04/17  [facility] CM Short Term Goal #1 Met Date  08/04/17  Interventions for Short Term Goal #1  Patient discussed what he is eating and avouding in his diet.  THN CM Short Term Goal #2 Start Date  08/04/17  [facility] CM Short Term Goal #2 Met Date  08/04/17  Interventions for Short Term Goal #2  The patient states that he gets out and works in  his yard and with his animals on a daily basis.     Plan: RN Health Coach will contact patient in the month of April  and patient agrees to next outreach.  Lazaro Arms RN, BSN, [place] Direct Dial:  [phone]  Fax: [phone]

## 2017-08-11 ENCOUNTER — Ambulatory Visit (INDEPENDENT_AMBULATORY_CARE_PROVIDER_SITE_OTHER): Payer: Medicare Other | Admitting: *Deleted

## 2017-08-11 ENCOUNTER — Encounter: Payer: Self-pay | Admitting: *Deleted

## 2017-08-11 DIAGNOSIS — Z0000 Encounter for general adult medical examination without abnormal findings: Secondary | ICD-10-CM

## 2017-08-11 DIAGNOSIS — R609 Edema, unspecified: Secondary | ICD-10-CM

## 2017-08-11 MED ORDER — FUROSEMIDE 20 MG PO TABS: 20.0000 mg | ORAL_TABLET | Freq: Every day | ORAL | 1 refills | Status: DC | PRN

## 2017-08-11 NOTE — Progress Notes (Addendum)
Subjective:   Andrew [name] is a 67 y.o. male who presents for a subsequent Medicare Annual Wellness Visit. [name] is divorced but has a live in girlfriend. He has one adult daughter and 2 grandchildren. He moved to [facility] from [place] about 3 years ago to be closer to his daughter and grandchildren. 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   Review of Systems  His health is about the same as last year.   Cardiac Risk Factors include: advanced age (>37men, >53 women);dyslipidemia;smoking/ tobacco exposure;hypertension;sedentary lifestyle;male gender  He does report some short term memory problems possibly related to past stroke. He has a difficult time forming new memories and has a hard time recalling timeframes appropriately.   Musc: Multiple joint pains   Objective:    Today's Vitals   08/11/17 1006 08/11/17 1010  BP: 134/78   Pulse: 89   Weight: 218 lb (98.9 kg)   Height: 6\' 2"  (1.88 m)   PainSc:  3    Body mass index is 27.99 kg/m.  Advanced Directives 08/11/2017 01/17/2017 01/14/2017 01/07/2017 01/03/2017 08/10/2016 01/16/2016  Does Patient Have a Medical Advance Directive? Yes Yes No Yes Yes Yes Yes  Type of Paramedic of [place];Living will Living will - Living will Living will [place];Living will [place];Living will  Does patient want to make changes to medical advance directive? No - Patient declined - - - No - Patient declined - Yes - information given  Copy of [place] in Chart? No - copy requested - - - - No - copy requested No - copy requested  Would patient like information on creating a medical advance directive? - - - - - - -    Current Medications (verified) Outpatient Encounter Medications as of 08/11/2017  Medication Sig  . aspirin 81 MG  tablet Take 81 mg by mouth daily.  . dapagliflozin propanediol (FARXIGA) 10 MG TABS tablet Take 10 mg by mouth daily.  . furosemide (LASIX) 20 MG tablet Take 1 tablet (20 mg total) by mouth daily as needed.  Marland Kitchen glimepiride (AMARYL) 4 MG tablet TAKE 2 TABLETS ONCE DAILY WITH BREAKFAST  . metFORMIN (GLUCOPHAGE) 1000 MG tablet Take 1 tablet (1,000 mg total) by mouth 2 (two) times daily with a meal.  . naproxen sodium (ANAPROX) 220 MG tablet Take 440-660 mg by mouth 2 (two) times daily as needed (pain).  Marland Kitchen omeprazole (PRILOSEC) 20 MG capsule TAKE (1) CAPSULE DAILY  . pravastatin (PRAVACHOL) 80 MG tablet Take 1 tablet (80 mg total) by mouth daily.  . primidone (MYSOLINE) 50 MG tablet Take 1 tablet (50 mg total) by mouth daily.  . sitaGLIPtin (JANUVIA) 100 MG tablet Take 1 tablet (100 mg total) by mouth daily.  . [DISCONTINUED] furosemide (LASIX) 20 MG tablet Take 1 tablet (20 mg total) by mouth daily as needed.  . [DISCONTINUED] furosemide (LASIX) 40 MG tablet TAKE 1 TABLET ONCE DAILY   No facility-administered encounter medications on file as of 08/11/2017.     Allergies (verified) Tramadol and Trulicity [dulaglutide]   History: Past Medical History:  Diagnosis Date  . Anemia   . Arthritis   . Cancer [facility])    Bladder  . Depression   . Diabetes mellitus without complication ([place])   . GERD (gastroesophageal reflux disease)   .  Hypercholesteremia   . Hyperlipidemia   . Neuromuscular disorder ([place])   . Status post tendon repair 1989  . Stroke ([place])    At times pt has dizziness with loss of vision  . Tremors of nervous system 2011   Past Surgical History:  Procedure Laterality Date  . APPENDECTOMY    . BLADDER SURGERY    . CYSTOSCOPY W/ RETROGRADES Bilateral 08/22/2015   Procedure: CYSTOSCOPY WITH RETROGRADE PYELOGRAM;  Surgeon: Irine Seal, MD;  Location: [facility];  Service: Urology;  Laterality: Bilateral;  . CYSTOSCOPY W/ RETROGRADES Bilateral 01/16/2016   Procedure: CYSTOSCOPY WITH  RETROGRADE PYELOGRAM;  Surgeon: Irine Seal, MD;  Location: [facility];  Service: Urology;  Laterality: Bilateral;  . CYSTOSCOPY W/ RETROGRADES Bilateral 01/07/2017   Procedure: CYSTOSCOPY WITH RETROGRADE PYELOGRAM;  Surgeon: Irine Seal, MD;  Location: [facility];  Service: Urology;  Laterality: Bilateral;  . CYSTOSCOPY WITH BIOPSY N/A 08/22/2015   Procedure: CYSTOSCOPY WITH BLADDER BIOPSY;  Surgeon: Irine Seal, MD;  Location: [facility];  Service: Urology;  Laterality: N/A;  . CYSTOSCOPY WITH BIOPSY N/A 01/16/2016   Procedure: CYSTOSCOPY WITH BLADDER BIOPSY;  Surgeon: Irine Seal, MD;  Location: [facility];  Service: Urology;  Laterality: N/A;  . CYSTOSCOPY WITH FULGERATION N/A 01/16/2016   Procedure: CYSTOSCOPY WITH FULGERATION;  Surgeon: Irine Seal, MD;  Location: [facility];  Service: Urology;  Laterality: N/A;  . KNEE ARTHROSCOPY Right 1989  . ROTATOR CUFF REPAIR Bilateral 2000  . TRANSURETHRAL RESECTION OF BLADDER TUMOR N/A 01/07/2017   Procedure: TRANSURETHRAL RESECTION OF BLADDER TUMOR (TURBT);  Surgeon: Irine Seal, MD;  Location: [facility];  Service: Urology;  Laterality: N/A;   Family History  Problem Relation Age of Onset  . Diabetes Mother 47       type 1  . Stroke Mother   . Dementia Father   . Diabetes Brother   . Heart attack Brother 34  . Diabetes Brother   . Cancer Brother        testicular   Social History   Socioeconomic History  . Marital status: Single    Spouse name: Not on file  . Number of children: 1  . Years of education: some college  . Highest education level: Some college, no degree  Occupational History  . Occupation: retired    Comment: Architect, some farming  Social Needs  . Financial resource strain: Not hard at all  . Food insecurity:    Worry: Never true    Inability: Never true  . Transportation needs:    Medical: No    Non-medical: No  Tobacco Use  . Smoking status: Current Every Day Smoker    Packs/day: 1.00    Years: 42.00    Pack years: 42.00     Types: Cigarettes  . Smokeless tobacco: Never Used  Substance and Sexual Activity  . Alcohol use: No  . Drug use: No  . Sexual activity: Never  Lifestyle  . Physical activity:    Days per week: 5 days    Minutes per session: 90 min  . Stress: Not on file  Relationships  . Social connections:    Talks on phone: Not on file    Gets together: Not on file    Attends religious service: Not on file    Active member of club or organization: Not on file    Attends meetings of clubs or organizations: Not on file    Relationship status: Not on file  Other Topics Concern  . Not  on file  Social History Narrative  . Not on file   Tobacco Counseling Ready to quit: Not Answered Counseling given: Not Answered   Clinical Intake:     Pain : 0-10 Pain Score: 3  Pain Type: Chronic pain Pain Location: Back Pain Descriptors / Indicators: Aching Pain Onset: More than a month ago Pain Frequency: Intermittent Effect of Pain on Daily Activities: minimal     Nutritional Status: BMI of 19-24  Normal Diabetes: No  How often do you need to have someone help you when you read instructions, pamphlets, or other written materials from your doctor or pharmacy?: 1 - Never What is the last grade level you completed in school?: one year of college  Interpreter Needed?: No  Information entered by :: Chong Sicilian, RN  Activities of Daily Living In your present state of health, do you have any difficulty performing the following activities: 08/11/2017 01/17/2017  Hearing? 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 Y N  Comment Has reading glasses. Last eye exam was last year.  -  Difficulty concentrating or making decisions? Y N  Comment has some difficulty with forming new and short term memory due to previous stroke  -  Walking or climbing stairs? Y Y  Comment multiple fractures in lower extremity -  Dressing or bathing? N N  Doing errands,  shopping? N N  Preparing Food and eating ? N N  Using the Toilet? N N  In the past six months, have you accidently leaked urine? N N  Do you have problems with loss of bowel control? N N  Managing your Medications? N N  Managing your Finances? N N  Housekeeping or managing your Housekeeping? N Y  Some recent data might be hidden     Immunizations and Health Maintenance Immunization History  Administered Date(s) Administered  . Influenza, High Dose Seasonal PF 02/25/2016  . Influenza,inj,Quad PF,6+ Mos 03/14/2017   Health Maintenance Due  Topic Date Due  . COLONOSCOPY  01/23/2000  . FOOT EXAM  02/24/2017  . OPHTHALMOLOGY EXAM  07/05/2017    Patient Care Team: Eustaquio Maize, MD as PCP - General (Pediatrics) Lazaro Arms, RN as [place] Management  ED visit 01/14/17 LBP and sciatica and 01/07/17 admission for bladder cancer surgery.     Assessment:   This is a routine wellness examination for [employer].  Hearing/Vision screen No deficits noted during visit. Eye exam is overdue.  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 Exercise 150 min/wk Moderate Activity      Depression Screen PHQ 2/9 Scores 08/11/2017 07/07/2017 04/21/2017 04/06/2017  PHQ - 2 Score 1 0 2 0  PHQ- 9 Score - - 4 -    Fall Risk Fall Risk  08/11/2017 08/04/2017 07/07/2017 07/04/2017 05/20/2017  Falls in the past year? 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 No - No No -  Comment - - - Patient states that he was bruised and sore -  Risk Factor Category  - - - - -  Risk for fall due to : History of fall(s) - - - -  Follow up Falls prevention discussed - - - -    Cognitive Function: MMSE - Mini Mental State Exam 08/11/2017 08/11/2017 08/10/2016  Orientation to time 5 5 5   Orientation to Place 5 5 5   Registration 3 3 3   Attention/ Calculation 5 5 4   Recall 3 3 2   Language- name 2 objects 2 2 2   Language- repeat 1 1 1   Language- follow 3 step command 3 3 3   Language- read & follow direction 1 1 1   Write a sentence 1 - 1  Copy design 0 - 1  Total score 29 - 28     Screening Tests Health Maintenance  Topic Date Due  . COLONOSCOPY  01/23/2000  . FOOT EXAM  02/24/2017  . OPHTHALMOLOGY EXAM  07/05/2017  . TETANUS/TDAP  10/06/2017 (Originally 01/22/1970)  . PNA vac Low Risk Adult (1 of 2 - PCV13) 01/09/2018 (Originally 01/23/2015)  . URINE MICROALBUMIN  10/06/2017  . COLON CANCER SCREENING ANNUAL FOBT  10/12/2017  . HEMOGLOBIN A1C  01/04/2018  . INFLUENZA VACCINE  Completed  . Hepatitis C Screening  Completed       Plan:    colonoscopy scheduled in April at [facility] f/u with PCP Continue to stay active. Aim for 150 minutes of moderate activity a week  I have personally reviewed and noted the following in the patient's chart:   . Medical and social history . Use of alcohol, tobacco or illicit drugs  . Current medications and supplements . Functional ability and status . Nutritional status . Physical activity . Advanced directives . List of other physicians . Hospitalizations, surgeries, and ER visits in previous 12 months . Vitals . Screenings to include cognitive, depression, and falls . Referrals and appointments  In addition, I have reviewed and discussed with patient certain preventive protocols, quality metrics, and best practice recommendations. A written personalized  care plan for preventive services as well as general preventive health recommendations were provided to patient.     Chong Sicilian, RN    08/11/2017   I have reviewed and agree with the above AWV documentation.   Mary-Margaret Hassell Done, FNP

## 2017-08-11 NOTE — Patient Instructions (Addendum)
  Mr. Merrick , Thank you for taking time to come for your Medicare Wellness Visit. I appreciate your ongoing commitment to your health goals. Please review the following plan we discussed and let me know if I can assist you in the future.   These are the goals we discussed: Goals    . Exercise 150 min/wk Moderate Activity       This is a list of the screening recommended for you and due dates:  Health Maintenance  Topic Date Due  . Colon Cancer Screening  01/23/2000  . Complete foot exam   02/24/2017  . Eye exam for diabetics  07/05/2017  . Tetanus Vaccine  10/06/2017*  . Pneumonia vaccines (1 of 2 - PCV13) 01/09/2018*  . Urine Protein Check  10/06/2017  . Stool Blood Test  10/12/2017  . Hemoglobin A1C  01/04/2018  . Flu Shot  Completed  .  Hepatitis C: One time screening is recommended by Center for Disease Control  (CDC) for  adults born from [birth year] through 1966.   Completed  *Topic was postponed. The date shown is not the original due date.

## 2017-08-17 ENCOUNTER — Other Ambulatory Visit: Payer: Self-pay

## 2017-08-17 ENCOUNTER — Ambulatory Visit (AMBULATORY_SURGERY_CENTER): Payer: Self-pay | Admitting: *Deleted

## 2017-08-17 VITALS — Ht 74.25 in | Wt 222.4 lb

## 2017-08-17 DIAGNOSIS — Z1211 Encounter for screening for malignant neoplasm of colon: Secondary | ICD-10-CM

## 2017-08-17 NOTE — Progress Notes (Signed)
No egg or soy allergy known to patient  No issues with past sedation with any surgeries  or procedures, no intubation problems  No diet pills per patient No home 02 use per patient  No blood thinners per patient  Pt denies issues with constipation on and off will take laxatives   No A fib or A flutter  EMMI video sent to pt's e mail pt. declined

## 2017-08-30 ENCOUNTER — Encounter: Payer: Medicare Other | Admitting: Gastroenterology

## 2017-09-07 ENCOUNTER — Other Ambulatory Visit: Payer: Self-pay

## 2017-09-07 NOTE — Patient Outreach (Signed)
[facility]) Care Management  09/07/2017  Andrew [name] 1950/08/06 [id number]   1st outreach attempt to the patient for monthly assessment.  No answer.  HIPAA compliant voicemail left with contact information.  Plan: RN Health Coach will send letter. RN Health Coach will make outreach attempt in three to four business days.    Lazaro Arms RN, BSN, [place] Direct Dial:  [phone]  Fax: [phone]

## 2017-09-08 DIAGNOSIS — L97511 Non-pressure chronic ulcer of other part of right foot limited to breakdown of skin: Secondary | ICD-10-CM | POA: Diagnosis not present

## 2017-09-12 ENCOUNTER — Other Ambulatory Visit: Payer: Self-pay

## 2017-09-12 NOTE — Patient Outreach (Addendum)
[facility]) Care Management  [place]  09/12/2017   Andrew [name] [birth year] [id number]  Subjective: Telephone call to the patient for monthly telephone call.  The patient states that he is doing fine. He denies any falls.  He states that he is having pain in his back and joints and rates the pain a 5/10.  He states that his blood sugars in the morning have started to decrease from 170 to 150 and in the evening it is ranging from 115-90.  He states that he is monitoring his diet for carbohydrates and bread. RN Health Coach encouraged the patient to continue to watch his food intake.  The patient is taking his medications as prescribed.  The patient is active around his home and in the yard. The patient has been checking his feet and noticed a corn on his fourth toe.  He went to his podiatrist and had the corn shaved and given ointment.   He has  A follow up appointment with the podiatrist in June as well as a recheck for his a1c.   Encounter Medications:  Outpatient Encounter Medications as of 09/12/2017  Medication Sig Note  . aspirin 81 MG tablet Take 81 mg by mouth daily. 12/30/2016: Holding for 5 days prior to surgery.  . dapagliflozin propanediol (FARXIGA) 10 MG TABS tablet Take 10 mg by mouth daily.   . furosemide (LASIX) 20 MG tablet Take 1 tablet (20 mg total) by mouth daily as needed.   Marland Kitchen glimepiride (AMARYL) 4 MG tablet TAKE 2 TABLETS ONCE DAILY WITH BREAKFAST   . metFORMIN (GLUCOPHAGE) 1000 MG tablet Take 1 tablet (1,000 mg total) by mouth 2 (two) times daily with a meal.   . naproxen sodium (ANAPROX) 220 MG tablet Take 440-660 mg by mouth 2 (two) times daily as needed (pain).   Marland Kitchen omeprazole (PRILOSEC) 20 MG capsule TAKE (1) CAPSULE DAILY   . pravastatin (PRAVACHOL) 80 MG tablet Take 1 tablet (80 mg total) by mouth daily.   . primidone (MYSOLINE) 50 MG tablet Take 1 tablet (50 mg total) by mouth daily.   . sitaGLIPtin (JANUVIA) 100 MG tablet Take 1 tablet  (100 mg total) by mouth daily.    No facility-administered encounter medications on file as of 09/12/2017.     Functional Status:  In your present state of health, do you have any difficulty performing the following activities: 08/11/2017 01/17/2017  Hearing? 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 Y N  Comment Has reading glasses. Last eye exam was last year.  -  Difficulty concentrating or making decisions? Y N  Comment has some difficulty with forming new and short term memory due to previous stroke  -  Walking or climbing stairs? Y Y  Comment multiple fractures in lower extremity -  Dressing or bathing? N N  Doing errands, shopping? N N  Preparing Food and eating ? N N  Using the Toilet? N N  In the past six months, have you accidently leaked urine? N N  Do you have problems with loss of bowel control? N N  Managing your Medications? N N  Managing your Finances? N N  Housekeeping or managing your Housekeeping? N Y  Some recent data might be hidden    Fall/Depression Screening: Fall Risk  09/12/2017 08/11/2017 08/04/2017  Falls in the past year? 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 - No -  Comment - - -  Risk Factor Category  - - -  Risk for fall due to : - History of fall(s) -  Follow up - Falls prevention discussed -   PHQ 2/9 Scores 08/11/2017 07/07/2017 04/21/2017 04/06/2017 03/28/2017 03/14/2017 03/08/2017  PHQ - 2 Score 1 0 2 0 2 2 3   PHQ- 9 Score - - 4 - 6 6 8     Assessment:  Patient continues to benefit from health coach outreach for disease management and support.    THN CM Care Plan Problem One     Most Recent Value  THN Long Term Goal   Patient will decrease A1c of 7.7 by 0.2 points within the next 90 days.  THN Long Term Goal Start Date  09/12/17  Interventions for Problem One Long Term Goal  [facility]  Dicussed with the patient about diet and medication management.  THN CM Short Term Goal #1   In 60 days the patient will verbalize keeping his foot appointment  THN CM Short Term Goal #1 Start Date  09/12/17  Interventions for Short Term Goal #1  Discussed with the patient about keepin his appointment with the podiatrist.  [facility] CM Short Term Goal #3  In the next 60 days the patient willverbalize that he has decrease smoking to a 1/2 pack every  three days  THN CM Short Term Goal #3 Start Date  09/12/17  Interventions for Short Tern Goal #3  [facility] reinterated to the paitent health benefits of quitting smoking     Plan:  [place] will contact patient in the month of June and patient agrees to next outreach.  Lazaro Arms RN, BSN, [place] Direct Dial:  [phone]  Fax: [phone]

## 2017-09-21 DIAGNOSIS — Z86010 Personal history of colonic polyps: Secondary | ICD-10-CM

## 2017-09-21 DIAGNOSIS — Z860101 Personal history of adenomatous and serrated colon polyps: Secondary | ICD-10-CM

## 2017-09-21 HISTORY — DX: Personal history of colonic polyps: Z86.010

## 2017-09-21 HISTORY — DX: Personal history of adenomatous and serrated colon polyps: Z86.0101

## 2017-10-06 ENCOUNTER — Encounter: Payer: Self-pay | Admitting: Pediatrics

## 2017-10-06 ENCOUNTER — Ambulatory Visit (INDEPENDENT_AMBULATORY_CARE_PROVIDER_SITE_OTHER): Payer: Medicare Other | Admitting: Pediatrics

## 2017-10-06 VITALS — BP 139/79 | HR 87 | Temp 98.0°F | Ht 74.25 in | Wt 217.0 lb

## 2017-10-06 DIAGNOSIS — E119 Type 2 diabetes mellitus without complications: Secondary | ICD-10-CM | POA: Diagnosis not present

## 2017-10-06 DIAGNOSIS — E785 Hyperlipidemia, unspecified: Secondary | ICD-10-CM

## 2017-10-06 DIAGNOSIS — G250 Essential tremor: Secondary | ICD-10-CM

## 2017-10-06 LAB — BAYER DCA HB A1C WAIVED: HB A1C (BAYER DCA - WAIVED): 8.4 % — ABNORMAL HIGH

## 2017-10-06 MED ORDER — PRAVASTATIN SODIUM 80 MG PO TABS: 80.0000 mg | ORAL_TABLET | Freq: Every day | ORAL | 3 refills | Status: DC

## 2017-10-06 MED ORDER — GLIMEPIRIDE 4 MG PO TABS: ORAL_TABLET | ORAL | 1 refills | Status: DC

## 2017-10-06 MED ORDER — SITAGLIPTIN PHOSPHATE 100 MG PO TABS: 100.0000 mg | ORAL_TABLET | Freq: Every day | ORAL | 2 refills | Status: DC

## 2017-10-06 MED ORDER — DAPAGLIFLOZIN PROPANEDIOL 10 MG PO TABS: 10.0000 mg | ORAL_TABLET | Freq: Every day | ORAL | 4 refills | Status: DC

## 2017-10-06 NOTE — Progress Notes (Signed)
  Subjective:   Patient ID: Andrew [name], male    DOB: 1950-07-12, 67 y.o.   MRN: [id number] CC: Medical Management of Chronic Issues  HPI: Andrew [name] is a 67 y.o. male   DM2: Taking medicines regularly.  Cost continues to be an issue.  Trying to decrease carbohydrate heavy foods, including potatoes.  Stays fairly active, walking daily.  Would like to avoid insulin if he can.  Hyperlipidemia: Tolerating statin.  Essential tremor: Some days worse than others.  Today is a pretty good day.  Taking primidone daily.  Relevant past medical, surgical, family and social history reviewed. Allergies and medications reviewed and updated. Social History   Tobacco Use  Smoking Status Current Every Day Smoker  . Packs/day: 1.00  . Years: 42.00  . Pack years: 42.00  . Types: Cigarettes  Smokeless Tobacco Former Systems developer   ROS: Per HPI   Objective:    BP 139/79   Pulse 87   Temp 98 F (36.7 C) (Oral)   Ht 6' 2.25" (1.886 m)   Wt 217 lb (98.4 kg)   BMI 27.67 kg/m   Wt Readings from Last 3 Encounters:  10/06/17 217 lb (98.4 kg)  08/17/17 222 lb 6.4 oz (100.9 kg)  08/11/17 218 lb (98.9 kg)    Gen: NAD, alert, cooperative with exam, NCAT EYES: EOMI, no conjunctival injection, or no icterus CV: NRRR, normal S1/S2, no murmur, distal pulses 2+ b/l Resp: CTABL, no wheezes, normal WOB Abd: +BS, soft, NTND. no guarding or organomegaly Ext: No edema, warm Neuro: Alert and oriented MSK: normal muscle bulk  Assessment & Plan:  Andrew [name] was seen today for medical management of chronic issues.  Diagnoses and all orders for this visit:  Type 2 diabetes mellitus without complication, without long-term current use of insulin (HCC) A1c 8.4.  On multiple oral agents.  Pt would like to try cutting back on carbohydrates in meals before changing current regimen. -     Bayer DCA Hb A1c Waived -     Microalbumin / creatinine urine ratio -     dapagliflozin propanediol (FARXIGA) 10 MG TABS tablet; Take 10  mg by mouth daily. -     glimepiride (AMARYL) 4 MG tablet; TAKE 2 TABLETS ONCE DAILY WITH BREAKFAST -     sitaGLIPtin (JANUVIA) 100 MG tablet; Take 1 tablet (100 mg total) by mouth daily.  Hyperlipidemia, unspecified hyperlipidemia type Stable, continue below -     pravastatin (PRAVACHOL) 80 MG tablet; Take 1 tablet (80 mg total) by mouth daily.  Essential tremor Stable, continue primidone  Follow up plan: 3 months, sooner if needed Assunta Found, MD [place]

## 2017-10-07 LAB — MICROALBUMIN / CREATININE URINE RATIO
Creatinine, Urine: 60.9 mg/dL
MICROALB/CREAT RATIO: 156 mg/g{creat} — AB (ref 0.0–30.0)
MICROALBUM., U, RANDOM: 95 ug/mL

## 2017-10-09 ENCOUNTER — Encounter: Payer: Self-pay | Admitting: Pediatrics

## 2017-10-10 ENCOUNTER — Other Ambulatory Visit: Payer: Self-pay | Admitting: Pediatrics

## 2017-10-10 DIAGNOSIS — R809 Proteinuria, unspecified: Secondary | ICD-10-CM

## 2017-10-10 MED ORDER — LISINOPRIL 10 MG PO TABS: 10.0000 mg | ORAL_TABLET | Freq: Every day | ORAL | 3 refills | Status: DC

## 2017-10-14 ENCOUNTER — Telehealth: Payer: Self-pay | Admitting: Gastroenterology

## 2017-10-14 ENCOUNTER — Encounter: Payer: Self-pay | Admitting: Gastroenterology

## 2017-10-14 ENCOUNTER — Other Ambulatory Visit: Payer: Self-pay

## 2017-10-14 ENCOUNTER — Ambulatory Visit (AMBULATORY_SURGERY_CENTER): Payer: Medicare Other | Admitting: Gastroenterology

## 2017-10-14 VITALS — BP 128/82 | HR 79 | Temp 98.0°F | Resp 17 | Ht 74.25 in | Wt 217.0 lb

## 2017-10-14 DIAGNOSIS — D122 Benign neoplasm of ascending colon: Secondary | ICD-10-CM

## 2017-10-14 DIAGNOSIS — D127 Benign neoplasm of rectosigmoid junction: Secondary | ICD-10-CM | POA: Diagnosis not present

## 2017-10-14 DIAGNOSIS — Z1211 Encounter for screening for malignant neoplasm of colon: Secondary | ICD-10-CM | POA: Diagnosis present

## 2017-10-14 DIAGNOSIS — D125 Benign neoplasm of sigmoid colon: Secondary | ICD-10-CM

## 2017-10-14 DIAGNOSIS — D120 Benign neoplasm of cecum: Secondary | ICD-10-CM

## 2017-10-14 DIAGNOSIS — D128 Benign neoplasm of rectum: Secondary | ICD-10-CM | POA: Diagnosis not present

## 2017-10-14 DIAGNOSIS — D129 Benign neoplasm of anus and anal canal: Secondary | ICD-10-CM

## 2017-10-14 MED ORDER — SODIUM CHLORIDE 0.9 % IV SOLN: 500.0000 mL | Freq: Once | INTRAVENOUS | Status: DC

## 2017-10-14 NOTE — Progress Notes (Signed)
A and O x3. Report to RN. Tolerated MAC anesthesia well.

## 2017-10-14 NOTE — Op Note (Signed)
[place] Patient Name: Andrew [name] Procedure Date: 10/14/2017 11:48 AM MRN: [id number] Endoscopist: Remo Lipps P. Havery Moros , MD Age: 67 Referring MD:  Date of Birth: 07-19-1950 Gender: Male Account #: [id number] Procedure:                Colonoscopy Indications:              Screening for colorectal malignant neoplasm Medicines:                Monitored Anesthesia Care Procedure:                Pre-Anesthesia Assessment:                           - Prior to the procedure, a History and Physical                            was performed, and patient medications and                            allergies were reviewed. The patient's tolerance of                            previous anesthesia was also reviewed. The risks                            and benefits of the procedure and the sedation                            options and risks were discussed with the patient.                            All questions were answered, and informed consent                            was obtained. Prior Anticoagulants: The patient has                            taken no previous anticoagulant or antiplatelet                            agents. ASA Grade Assessment: III - A patient with                            severe systemic disease. After reviewing the risks                            and benefits, the patient was deemed in                            satisfactory condition to undergo the procedure.                           After obtaining informed consent, the colonoscope  was passed under direct vision. Throughout the                            procedure, the patient's blood pressure, pulse, and                            oxygen saturations were monitored continuously. The                            Colonoscope was introduced through the anus and                            advanced to the the cecum, identified by                            appendiceal orifice  and ileocecal valve. The                            colonoscopy was performed without difficulty. The                            patient tolerated the procedure well. The quality                            of the bowel preparation was adequate. The                            ileocecal valve, appendiceal orifice, and rectum                            were photographed. Scope In: 11:53:53 AM Scope Out: 12:34:17 PM Scope Withdrawal Time: 0 hours 32 minutes 31 seconds  Total Procedure Duration: 0 hours 40 minutes 24 seconds  Findings:                 The perianal and digital rectal examinations were                            normal.                           A 7 mm polyp was found in the cecum. The polyp was                            sessile. The polyp was removed with a cold snare.                            Resection and retrieval were complete.                           Two sessile polyps were found in the ascending                            colon. The polyps were 8 to 10 mm in size. These  polyps were removed with a cold snare. Resection                            and retrieval were complete.                           A 5 mm polyp was found in the sigmoid colon. The                            polyp was sessile. The polyp was removed with a                            cold snare. Resection and retrieval were complete.                           Two sessile polyps were found in the recto-sigmoid                            colon. The polyps were 3 to 5 mm in size. These                            polyps were removed with a cold snare. Resection                            and retrieval were complete.                           A 7 mm polyp was found in the rectum. The polyp was                            pedunculated. The polyp was removed with a hot                            snare. Resection and retrieval were complete.                           A few  medium-mouthed diverticula were found in the                            left colon.                           Internal hemorrhoids were found during retroflexion.                           The prep was fair on insertion. Several minutes                            were needed to lavage the colon during withdrawal                            to obtain adequate views for screening purposes.  The exam was otherwise without abnormality. Complications:            No immediate complications. Estimated blood loss:                            Minimal. Estimated Blood Loss:     Estimated blood loss was minimal. Impression:               - One 7 mm polyp in the cecum, removed with a cold                            snare. Resected and retrieved.                           - Two 8 to 10 mm polyps in the ascending colon,                            removed with a cold snare. Resected and retrieved.                           - One 5 mm polyp in the sigmoid colon, removed with                            a cold snare. Resected and retrieved.                           - Two 3 to 5 mm polyps in the sigmoid colon,                            removed with a cold snare. Resected and retrieved.                           - One 7 mm polyp in the rectum, removed with a hot                            snare. Resected and retrieved.                           - Internal hemorrhoids.                           - The examination was otherwise normal.                           - Several minutes spent lavaging the colon to                            achieve adequate views Recommendation:           - Patient has a contact number available for                            emergencies. The signs and symptoms of potential  delayed complications were discussed with the                            patient. Return to normal activities tomorrow.                            Written  discharge instructions were provided to the                            patient.                           - Resume previous diet.                           - Continue present medications.                           - Await pathology results.                           - Repeat colonoscopy for surveillance.                           - No ibuprofen, naproxen, or other non-steroidal                            anti-inflammatory drugs for 2 weeks after polyp                            removal. Remo Lipps P. Armbruster, MD 10/14/2017 12:41:51 PM This report has been signed electronically.

## 2017-10-14 NOTE — Progress Notes (Signed)
No changes in medical or surgical hx since PV per pt 

## 2017-10-14 NOTE — Patient Instructions (Signed)
*  Handouts given to patient on polyps and hemorrhoids.  YOU HAD AN ENDOSCOPIC PROCEDURE TODAY AT [facility]:   Refer to the procedure report that was given to you for any specific questions about what was found during the examination.  If the procedure report does not answer your questions, please call your gastroenterologist to clarify.  If you requested that your care partner not be given the details of your procedure findings, then the procedure report has been included in a sealed envelope for you to review at your convenience later.  YOU SHOULD EXPECT: Some feelings of bloating in the abdomen. Passage of more gas than usual.  Walking can help get rid of the air that was put into your GI tract during the procedure and reduce the bloating. If you had a lower endoscopy (such as a colonoscopy or flexible sigmoidoscopy) you may notice spotting of blood in your stool or on the toilet paper. If you underwent a bowel prep for your procedure, you may not have a normal bowel movement for a few days.  Please Note:  You might notice some irritation and congestion in your nose or some drainage.  This is from the oxygen used during your procedure.  There is no need for concern and it should clear up in a day or so.  SYMPTOMS TO REPORT IMMEDIATELY:   Following lower endoscopy (colonoscopy or flexible sigmoidoscopy):  Excessive amounts of blood in the stool  Significant tenderness or worsening of abdominal pains  Swelling of the abdomen that is new, acute  Fever of 100F or higher   For urgent or emergent issues, a gastroenterologist can be reached at any hour by calling [phone].   DIET:  We do recommend a small meal at first, but then you may proceed to your regular diet.  Drink plenty of fluids but you should avoid alcoholic beverages for 24 hours.  ACTIVITY:  You should plan to take it easy for the rest of today and you should NOT DRIVE or use heavy machinery until tomorrow  (because of the sedation medicines used during the test).    FOLLOW UP: Our staff will call the number listed on your records the next business day following your procedure to check on you and address any questions or concerns that you may have regarding the information given to you following your procedure. If we do not reach you, we will leave a message.  However, if you are feeling well and you are not experiencing any problems, there is no need to return our call.  We will assume that you have returned to your regular daily activities without incident.  If any biopsies were taken you will be contacted by phone or by letter within the next 1-3 weeks.  Please call us at [phone] if you have not heard about the biopsies in 3 weeks.    SIGNATURES/CONFIDENTIALITY: You and/or your care partner have signed paperwork which will be entered into your electronic medical record.  These signatures attest to the fact that that the information above on your After Visit Summary has been reviewed and is understood.  Full responsibility of the confidentiality of this discharge information lies with you and/or your care-partner.

## 2017-10-14 NOTE — Progress Notes (Signed)
Called to room to assist during endoscopic procedure.  Patient ID and intended procedure confirmed with present staff. Received instructions for my participation in the procedure from the performing physician.  

## 2017-10-14 NOTE — Telephone Encounter (Signed)
Spoke with Dr. Havery Moros- ok to drink the second half of the prep right now and be NPO at 9:30 a.m.   Spoke with pt and he will drink the rest of his prep right now and be NPO at 9:30 a.m.

## 2017-10-18 ENCOUNTER — Telehealth: Payer: Self-pay | Admitting: *Deleted

## 2017-10-18 NOTE — Telephone Encounter (Signed)
  Follow up Call-  Call back number 10/14/2017  Post procedure Call Back phone  # [phone]   Permission to leave phone message Yes     Patient questions:  Do you have a fever, pain , or abdominal swelling? No. Pain Score  0 *  Have you tolerated food without any problems? Yes.    Have you been able to return to your normal activities? Yes.    Do you have any questions about your discharge instructions: Diet   No. Medications  No. Follow up visit  No.  Do you have questions or concerns about your Care? No.  Actions: * If pain score is 4 or above: No action needed, pain <4.

## 2017-10-19 ENCOUNTER — Other Ambulatory Visit: Payer: Self-pay | Admitting: Pediatrics

## 2017-10-19 DIAGNOSIS — G250 Essential tremor: Secondary | ICD-10-CM

## 2017-10-21 ENCOUNTER — Encounter: Payer: Self-pay | Admitting: Gastroenterology

## 2017-10-26 ENCOUNTER — Ambulatory Visit (INDEPENDENT_AMBULATORY_CARE_PROVIDER_SITE_OTHER): Payer: Medicare Other | Admitting: Urology

## 2017-10-26 DIAGNOSIS — C671 Malignant neoplasm of dome of bladder: Secondary | ICD-10-CM

## 2017-10-27 DIAGNOSIS — L97511 Non-pressure chronic ulcer of other part of right foot limited to breakdown of skin: Secondary | ICD-10-CM | POA: Diagnosis not present

## 2017-11-03 ENCOUNTER — Other Ambulatory Visit: Payer: Self-pay | Admitting: Pediatrics

## 2017-11-03 DIAGNOSIS — K219 Gastro-esophageal reflux disease without esophagitis: Secondary | ICD-10-CM

## 2017-11-11 ENCOUNTER — Other Ambulatory Visit: Payer: Self-pay

## 2017-11-11 NOTE — Patient Outreach (Signed)
[facility]) Care Management  [place]  11/11/2017   Lopez Dentinger 09/17/1950 [id number]  Subjective: Telephone call to the patient for assessment.  HIPAA verified.  The patient states that he had an appointment with his physician and checked his a1c.  It has elevated from 7.7 to 8.4. The patient had not checked his blood sugar yet this morning. He states that he is adherent with his medications.  I discussed with the patient about his food intake and watching his carbohydrates.  The patient verbalized understanding.  He denies any pain or falls.  The patient states that he has had some personal issues going on and felt some unhappiness.  I talked with the patient and advised him to call his physician office and discuss the situation.  The patient verbalized understanding.  The patient has an appointment with Dr. Evette Doffing on 01/06/18.  He had an endoscopy done on 10/14/17.  He has an appointment scheduled for a bladder recheck on 05/03/18.   Encounter Medications:  Outpatient Encounter Medications as of 11/11/2017  Medication Sig Note  . aspirin 81 MG tablet Take 81 mg by mouth daily. 12/30/2016: Holding for 5 days prior to surgery.  . dapagliflozin propanediol (FARXIGA) 10 MG TABS tablet Take 10 mg by mouth daily.   . furosemide (LASIX) 20 MG tablet Take 1 tablet (20 mg total) by mouth daily as needed.   Marland Kitchen glimepiride (AMARYL) 4 MG tablet TAKE 2 TABLETS ONCE DAILY WITH BREAKFAST   . lisinopril (PRINIVIL,ZESTRIL) 10 MG tablet Take 1 tablet (10 mg total) by mouth daily.   . metFORMIN (GLUCOPHAGE) 1000 MG tablet Take 1 tablet (1,000 mg total) by mouth 2 (two) times daily with a meal.   . naproxen sodium (ANAPROX) 220 MG tablet Take 440-660 mg by mouth 2 (two) times daily as needed (pain).   Marland Kitchen omeprazole (PRILOSEC) 20 MG capsule TAKE (1) CAPSULE DAILY   . pravastatin (PRAVACHOL) 80 MG tablet Take 1 tablet (80 mg total) by mouth daily.   . primidone (MYSOLINE) 50 MG tablet  TAKE 1 TABLET DAILY   . sitaGLIPtin (JANUVIA) 100 MG tablet Take 1 tablet (100 mg total) by mouth daily.    Facility-Administered Encounter Medications as of 11/11/2017  Medication  . 0.9 %  sodium chloride infusion    Functional Status:  In your present state of health, do you have any difficulty performing the following activities: 08/11/2017 01/17/2017  Hearing? 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 Y N  Comment Has reading glasses. Last eye exam was last year.  -  Difficulty concentrating or making decisions? Y N  Comment has some difficulty with forming new and short term memory due to previous stroke  -  Walking or climbing stairs? Y Y  Comment multiple fractures in lower extremity -  Dressing or bathing? N N  Doing errands, shopping? N N  Preparing Food and eating ? N N  Using the Toilet? N N  In the past six months, have you accidently leaked urine? N N  Do you have problems with loss of bowel control? N N  Managing your Medications? N N  Managing your Finances? N N  Housekeeping or managing your Housekeeping? N Y  Some recent data might be hidden    Fall/Depression Screening: Fall Risk  11/11/2017 10/06/2017 09/12/2017  Falls in the past year? No Yes No  Comment - - -  Number falls  in past yr: - 1 -  Comment - - -  Injury with Fall? - No -  Comment - - -  Risk Factor Category  - - -  Risk for fall due to : - History of fall(s) -  Follow up - Falls prevention discussed -   PHQ 2/9 Scores 11/11/2017 10/06/2017 08/11/2017 07/07/2017 04/21/2017 04/06/2017 03/28/2017  PHQ - 2 Score 1 0 1 0 2 0 2  PHQ- 9 Score - - - - 4 - 6    Assessment: Patient will continue to benefit from health coach outreach for disease management and support.  THN CM Care Plan Problem One     Most Recent Value  THN Long Term Goal   Patient will decrease A1c of 8.4 by 0.2 points within the next 90 days.  THN Long Term Goal Start Date  11/11/17   Interventions for Problem One Long Term Goal  Talked about food intake and medication management  THN CM Short Term Goal #1   In 30 days the patient will verbalize keeping his foot appointment  THN CM Short Term Goal #1 Start Date  11/11/17  Interventions for Short Term Goal #1  Patient states that he has an appointment this month talked about the inmportance of foot care  THN CM Short Term Goal #2   In 30 days the patient will report talking with his physician about feelings of unhappiness  THN CM Short Term Goal #2 Start Date  11/11/17  Interventions for Short Term Goal #2  talked with the patient about his feeling and stted he needs to let his physician know about his situation  THN CM Short Term Goal #3  In the next 60 days the patient willverbalize that he has decrease smoking to a 1/2 pack every  three days  [facility] CM Short Term Goal #3 Start Date  11/11/17  Interventions for Short Tern Goal #3  discussed smoking cessation     Plan: [place] will contact patient in the month of July and patient agrees to next outreach.  Lazaro Arms RN, BSN, [place] Direct Dial:  [phone]  Fax: [phone]

## 2017-12-13 ENCOUNTER — Other Ambulatory Visit: Payer: Self-pay

## 2017-12-13 NOTE — Patient Outreach (Signed)
[facility]) Care Management  [facility]  12/13/2017   Andrew [name] 12/01/1950 [id number]  Subjective: Successful telephone call to the patient for assessment.  HIPAA verified.  The patient is doing well.  He states that he is eating more food from his garden.  His blood sugar this mornin was 122.  They have been ranging in the morning 110-120.  He denies any falls.  He states that he has been having  chronic pain and rates it at a 3/10.  He has been doing chores around the home for exercise.  I advised the patient to be careful in the heat and stay hydrated.  He verbalized understanding.  The patient states he is adherent with his medications but since he has been on lisinopril he has been feeling episodes of dizziness.  I advised the patient to inform his physician.  I also discussed with him being careful with his movements and get adjusted.  He verbalized understanding.  His next appointment is in August.  Encounter Medications:  Outpatient Encounter Medications as of 12/13/2017  Medication Sig Note  . aspirin 81 MG tablet Take 81 mg by mouth daily. 12/30/2016: Holding for 5 days prior to surgery.  . dapagliflozin propanediol (FARXIGA) 10 MG TABS tablet Take 10 mg by mouth daily.   . furosemide (LASIX) 20 MG tablet Take 1 tablet (20 mg total) by mouth daily as needed.   Marland Kitchen glimepiride (AMARYL) 4 MG tablet TAKE 2 TABLETS ONCE DAILY WITH BREAKFAST   . lisinopril (PRINIVIL,ZESTRIL) 10 MG tablet Take 1 tablet (10 mg total) by mouth daily.   . metFORMIN (GLUCOPHAGE) 1000 MG tablet Take 1 tablet (1,000 mg total) by mouth 2 (two) times daily with a meal.   . naproxen sodium (ANAPROX) 220 MG tablet Take 440-660 mg by mouth 2 (two) times daily as needed (pain).   Marland Kitchen omeprazole (PRILOSEC) 20 MG capsule TAKE (1) CAPSULE DAILY   . pravastatin (PRAVACHOL) 80 MG tablet Take 1 tablet (80 mg total) by mouth daily.   . primidone (MYSOLINE) 50 MG tablet TAKE 1 TABLET DAILY   . sitaGLIPtin  (JANUVIA) 100 MG tablet Take 1 tablet (100 mg total) by mouth daily.    Facility-Administered Encounter Medications as of 12/13/2017  Medication  . 0.9 %  sodium chloride infusion    Functional Status:  In your present state of health, do you have any difficulty performing the following activities: 08/11/2017 01/17/2017  Hearing? 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 Y N  Comment Has reading glasses. Last eye exam was last year.  -  Difficulty concentrating or making decisions? Y N  Comment has some difficulty with forming new and short term memory due to previous stroke  -  Walking or climbing stairs? Y Y  Comment multiple fractures in lower extremity -  Dressing or bathing? N N  Doing errands, shopping? N N  Preparing Food and eating ? N N  Using the Toilet? N N  In the past six months, have you accidently leaked urine? N N  Do you have problems with loss of bowel control? N N  Managing your Medications? N N  Managing your Finances? N N  Housekeeping or managing your Housekeeping? N Y  Some recent data might be hidden    Fall/Depression Screening: Fall Risk  12/13/2017 11/11/2017 10/06/2017  Falls in the past year? No No Yes  Comment - - -  Number falls in past yr: - - 1  Comment - - -  Injury with Fall? - - No  Comment - - -  Risk Factor Category  - - -  Risk for fall due to : - - History of fall(s)  Follow up - - Falls prevention discussed   PHQ 2/9 Scores 11/11/2017 10/06/2017 08/11/2017 07/07/2017 04/21/2017 04/06/2017 03/28/2017  PHQ - 2 Score 1 0 1 0 2 0 2  PHQ- 9 Score - - - - 4 - 6    Assessment: Patient will continue to benefit from health coach outreach for disease management and support.   THN CM Care Plan Problem One     Most Recent Value  Care Plan Problem One  knowledge deficit related to disease management  Care Plan for Problem One  Active  THN Long Term Goal   Patient will decrease A1c of 8.4 by 0.2  points within the next 90 days.  THN Long Term Goal Start Date  12/13/17  Interventions for Problem One Long Term Goal  reviewed food intaked, cbg readings, medication management and exercise.     Plan:  RN Health Coach will contact patient in the month of August and patient agrees to next outreach.  Lazaro Arms RN, BSN, [place] Direct Dial:  [phone]  Fax: [phone]

## 2017-12-29 DIAGNOSIS — B351 Tinea unguium: Secondary | ICD-10-CM | POA: Diagnosis not present

## 2017-12-29 DIAGNOSIS — E1142 Type 2 diabetes mellitus with diabetic polyneuropathy: Secondary | ICD-10-CM | POA: Diagnosis not present

## 2017-12-29 DIAGNOSIS — L84 Corns and callosities: Secondary | ICD-10-CM | POA: Diagnosis not present

## 2017-12-29 DIAGNOSIS — M79676 Pain in unspecified toe(s): Secondary | ICD-10-CM | POA: Diagnosis not present

## 2018-01-06 ENCOUNTER — Ambulatory Visit (INDEPENDENT_AMBULATORY_CARE_PROVIDER_SITE_OTHER): Payer: Medicare Other | Admitting: Pediatrics

## 2018-01-06 ENCOUNTER — Encounter: Payer: Self-pay | Admitting: Pediatrics

## 2018-01-06 VITALS — BP 124/75 | HR 101 | Temp 97.9°F | Ht 74.25 in | Wt 207.6 lb

## 2018-01-06 DIAGNOSIS — E785 Hyperlipidemia, unspecified: Secondary | ICD-10-CM | POA: Diagnosis not present

## 2018-01-06 DIAGNOSIS — J449 Chronic obstructive pulmonary disease, unspecified: Secondary | ICD-10-CM | POA: Diagnosis not present

## 2018-01-06 DIAGNOSIS — E119 Type 2 diabetes mellitus without complications: Secondary | ICD-10-CM | POA: Diagnosis not present

## 2018-01-06 DIAGNOSIS — R809 Proteinuria, unspecified: Secondary | ICD-10-CM

## 2018-01-06 DIAGNOSIS — G250 Essential tremor: Secondary | ICD-10-CM | POA: Diagnosis not present

## 2018-01-06 LAB — BAYER DCA HB A1C WAIVED: HB A1C: 6.8 % (ref <7.0)

## 2018-01-06 MED ORDER — PRIMIDONE 50 MG PO TABS: 50.0000 mg | ORAL_TABLET | Freq: Four times a day (QID) | ORAL | 1 refills | Status: DC

## 2018-01-06 MED ORDER — BUDESONIDE-FORMOTEROL FUMARATE 80-4.5 MCG/ACT IN AERO: 2.0000 | INHALATION_SPRAY | Freq: Two times a day (BID) | RESPIRATORY_TRACT | 3 refills | Status: DC

## 2018-01-06 MED ORDER — LISINOPRIL 10 MG PO TABS: 10.0000 mg | ORAL_TABLET | Freq: Every day | ORAL | 1 refills | Status: DC

## 2018-01-06 MED ORDER — BUPROPION HCL ER (SR) 150 MG PO TB12: 150.0000 mg | ORAL_TABLET | Freq: Two times a day (BID) | ORAL | 4 refills | Status: DC

## 2018-01-06 MED ORDER — LISINOPRIL 10 MG PO TABS: 10.0000 mg | ORAL_TABLET | Freq: Every day | ORAL | 5 refills | Status: DC

## 2018-01-06 MED ORDER — PRIMIDONE 50 MG PO TABS: 50.0000 mg | ORAL_TABLET | Freq: Four times a day (QID) | ORAL | 4 refills | Status: DC

## 2018-01-06 MED ORDER — GLIMEPIRIDE 4 MG PO TABS: ORAL_TABLET | ORAL | 1 refills | Status: DC

## 2018-01-06 MED ORDER — SITAGLIPTIN PHOSPHATE 100 MG PO TABS: 100.0000 mg | ORAL_TABLET | Freq: Every day | ORAL | 5 refills | Status: DC

## 2018-01-06 MED ORDER — DAPAGLIFLOZIN PROPANEDIOL 10 MG PO TABS: 10.0000 mg | ORAL_TABLET | Freq: Every day | ORAL | 5 refills | Status: DC

## 2018-01-06 NOTE — Progress Notes (Signed)
  Subjective:   Patient ID: Andrew [name], male    DOB: [birth year], 67 y.o.   MRN: [id number] CC: Medical Management of Chronic Issues  HPI: Andrew [name] is a 67 y.o. male   Diabetes: decreased carb intake, not eating potatoes. Past month has been eating lots of tomatos, cucumbers other vegs. Has had a couple of low BGLs, down to 60 in the afternoon. Feels shaky when that happens.   Tremor: worse at times, better at others. Has been on primidone with some improvement in symptoms for a while, he thinks its not helping as well now  COPD and tobacco use: trying to cut back on cigarettes, has bene hard. Interested in trying wellbutrin for assistance with cessation. Was prescribed in the past, never started. Sometimes feels SOB and wheezy.  Relevant past medical, surgical, family and social history reviewed. Allergies and medications reviewed and updated. Social History   Tobacco Use  Smoking Status Current Every Day Smoker  . Packs/day: 1.00  . Years: 42.00  . Pack years: 42.00  . Types: Cigarettes  Smokeless Tobacco Former Systems developer   ROS: Per HPI   Objective:    BP 124/75   Pulse (!) 101   Temp 97.9 F (36.6 C) (Oral)   Ht 6' 2.25" (1.886 m)   Wt 207 lb 9.6 oz (94.2 kg)   BMI 26.48 kg/m   Wt Readings from Last 3 Encounters:  01/06/18 207 lb 9.6 oz (94.2 kg)  10/14/17 217 lb (98.4 kg)  10/06/17 217 lb (98.4 kg)    Gen: NAD, alert, cooperative with exam, NCAT EYES: EOMI, no conjunctival injection, or no icterus ENT: OP without erythema LYMPH: no cervical LAD CV: NRRR, normal S1/S2, no murmur, distal pulses 2+ b/l Resp: moving air well, slightly prolonged exp phase, no wheezes or rales, comfortable WOB Abd: +BS, soft, NTND. no guarding or organomegaly Ext: No edema, warm Neuro: Alert and oriented  Assessment & Plan:  Primitivo was seen today for medical management of chronic issues.  Diagnoses and all orders for this visit:  Type 2 diabetes mellitus without complication,  without long-term current use of insulin (HCC) A1c 6.8 with some lows in the afternoon. Will stop glimeperide. Cont other medications. -     Bayer DCA Hb A1c Waived -     Basic Metabolic Panel -     sitaGLIPtin (JANUVIA) 100 MG tablet; Take 1 tablet (100 mg total) by mouth daily. -     dapagliflozin propanediol (FARXIGA) 10 MG TABS tablet; Take 10 mg by mouth daily. -     Discontinue: glimepiride (AMARYL) 4 MG tablet; TAKE 1 TABLETS ONCE DAILY WITH BREAKFAST  Microalbuminuria Cont below. -     lisinopril (PRINIVIL,ZESTRIL) 10 MG tablet; Take 1 tablet (10 mg total) by mouth daily.  Essential tremor Increase dose of below -     primidone (MYSOLINE) 50 MG tablet; Take 1 tablet (50 mg total) by mouth 4 (four) times daily.  Chronic obstructive pulmonary disease, unspecified COPD type (HCC) Trial of below -     budesonide-formoterol (SYMBICORT) 80-4.5 MCG/ACT inhaler; Inhale 2 puffs into the lungs 2 (two) times daily.  Hyperlipidemia, unspecified hyperlipidemia type Stable, cont statin  Tobacco use Cessation strategies discusse,d start below -     buPROPion (WELLBUTRIN SR) 150 MG 12 hr tablet; Take 1 tablet (150 mg total) by mouth 2 (two) times daily.   Follow up plan: Return in about 3 months (around 04/08/2018). Assunta Found, MD [place]

## 2018-01-07 LAB — BASIC METABOLIC PANEL
BUN/Creatinine Ratio: 20 (ref 10–24)
BUN: 13 mg/dL (ref 8–27)
CALCIUM: 10.1 mg/dL (ref 8.6–10.2)
CO2: 21 mmol/L (ref 20–29)
CREATININE: 0.65 mg/dL — AB (ref 0.76–1.27)
Chloride: 102 mmol/L (ref 96–106)
GFR, EST AFRICAN AMERICAN: 116 mL/min/{1.73_m2} (ref >59)
GFR, EST NON AFRICAN AMERICAN: 101 mL/min/{1.73_m2} (ref >59)
Glucose: 109 mg/dL — ABNORMAL HIGH (ref 65–99)
Potassium: 4.4 mmol/L (ref 3.5–5.2)
Sodium: 140 mmol/L (ref 134–144)

## 2018-01-08 ENCOUNTER — Encounter: Payer: Self-pay | Admitting: Pediatrics

## 2018-01-13 ENCOUNTER — Other Ambulatory Visit: Payer: Self-pay

## 2018-01-13 ENCOUNTER — Other Ambulatory Visit: Payer: Self-pay | Admitting: *Deleted

## 2018-01-13 ENCOUNTER — Telehealth: Payer: Self-pay | Admitting: Pediatrics

## 2018-01-13 DIAGNOSIS — J449 Chronic obstructive pulmonary disease, unspecified: Secondary | ICD-10-CM

## 2018-01-13 MED ORDER — BUDESONIDE-FORMOTEROL FUMARATE 80-4.5 MCG/ACT IN AERO: 2.0000 | INHALATION_SPRAY | Freq: Two times a day (BID) | RESPIRATORY_TRACT | 2 refills | Status: DC

## 2018-01-13 NOTE — Telephone Encounter (Signed)
Patient aware that this office is out of farxiga samples at this time, discontinue glimepiride and take primidone 1 tablet 4 times daily per recent office note

## 2018-01-13 NOTE — Telephone Encounter (Signed)
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

## 2018-01-13 NOTE — Patient Outreach (Signed)
[facility]) Care Management  01/13/2018   Andrew [name] 1950/10/18 [id number]  Subjective: Telephone call to the patient for assessment.  HIPAA verified.  The patient states that he has been doing well. He saw his physician on 8/16 and his a1c was 6.8.  I congratulated him on lowering his a1c.  He also states that he has been having lows around 60 in the afternoon.  We discussed his food intake and making sure to incorporate his protein in his diet.  He stated that he is taking his medications.  After reviewing  his medications he is taking glimepiride which is not on the medication list.  I reviewed the doctors note and it states that he should stop the medication.  The patient has to go to the physician office today to pick up samples and I asked him to confirm if he is suppose to take the medication.  The patient verbalized understanding and stated that he would.  His next appointment with his physician is 04/08/2018.   Current Medications:  Current Outpatient Medications  Medication Sig Dispense Refill  . aspirin 81 MG tablet Take 81 mg by mouth daily.    . budesonide-formoterol (SYMBICORT) 80-4.5 MCG/ACT inhaler Inhale 2 puffs into the lungs 2 (two) times daily. 1 Inhaler 3  . buPROPion (WELLBUTRIN SR) 150 MG 12 hr tablet Take 1 tablet (150 mg total) by mouth 2 (two) times daily. 60 tablet 4  . dapagliflozin propanediol (FARXIGA) 10 MG TABS tablet Take 10 mg by mouth daily. 30 tablet 5  . furosemide (LASIX) 20 MG tablet Take 1 tablet (20 mg total) by mouth daily as needed. 90 tablet 1  . lisinopril (PRINIVIL,ZESTRIL) 10 MG tablet Take 1 tablet (10 mg total) by mouth daily. 90 tablet 1  . metFORMIN (GLUCOPHAGE) 1000 MG tablet Take 1 tablet (1,000 mg total) by mouth 2 (two) times daily with a meal. 180 tablet 3  . naproxen sodium (ANAPROX) 220 MG tablet Take 440-660 mg by mouth 2 (two) times daily as needed (pain).    Marland Kitchen omeprazole (PRILOSEC) 20 MG capsule TAKE (1) CAPSULE  DAILY 90 capsule 1  . pravastatin (PRAVACHOL) 80 MG tablet Take 1 tablet (80 mg total) by mouth daily. 90 tablet 3  . primidone (MYSOLINE) 50 MG tablet Take 1 tablet (50 mg total) by mouth 4 (four) times daily. 360 tablet 1  . sitaGLIPtin (JANUVIA) 100 MG tablet Take 1 tablet (100 mg total) by mouth daily. 30 tablet 5   Current Facility-Administered Medications  Medication Dose Route Frequency Provider Last Rate Last Dose  . 0.9 %  sodium chloride infusion  500 mL Intravenous Once Armbruster, Carlota Raspberry, MD        Functional Status:  In your present state of health, do you have any difficulty performing the following activities: 08/11/2017 01/17/2017  Hearing? 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 Y N  Comment Has reading glasses. Last eye exam was last year.  -  Difficulty concentrating or making decisions? Y N  Comment has some difficulty with forming new and short term memory due to previous stroke  -  Walking or climbing stairs? Y Y  Comment multiple fractures in lower extremity -  Dressing or bathing? N N  Doing errands, shopping? N N  Preparing Food and eating ? N N  Using the Toilet? N N  In the past six months, have you accidently leaked urine?  N N  Do you have problems with loss of bowel control? N N  Managing your Medications? N N  Managing your Finances? N N  Housekeeping or managing your Housekeeping? N Y  Some recent data might be hidden    Fall/Depression Screening: Fall Risk  01/06/2018 12/13/2017 11/11/2017  Falls in the past year? No No No  Comment - - -  Number falls in past yr: - - -  Comment - - -  Injury with Fall? - - -  Comment - - -  Risk Factor Category  - - -  Risk for fall due to : - - -  Follow up - - -   PHQ 2/9 Scores 01/06/2018 11/11/2017 10/06/2017 08/11/2017 07/07/2017 04/21/2017 04/06/2017  PHQ - 2 Score 0 1 0 1 0 2 0  PHQ- 9 Score - - - - - 4 -    Assessment: Patient will continue to benefit  from health coach outreach for disease management and support.  THN CM Care Plan Problem One     Most Recent Value  THN Long Term Goal   Patient will maintan A1c of 6.8 in 60 days  THN Long Term Goal Start Date  01/13/18  Interventions for Problem One Long Term Goal  reviewed cbg readings, food intake and medication management.     Plan: RN Health Coach will contact patient in the month of October and patient agrees to next outreach  [place], BSN, [place]:  [phone]  Fax: [phone]

## 2018-01-13 NOTE — Telephone Encounter (Signed)
PT is wanting to speak to [facility] about glinipride? Confusion about if he is suppose to be taking it. He also wants to talk to her about [place]

## 2018-01-20 ENCOUNTER — Other Ambulatory Visit: Payer: Self-pay

## 2018-01-20 ENCOUNTER — Inpatient Hospital Stay (HOSPITAL_COMMUNITY)
Admission: EM | Admit: 2018-01-20 | Discharge: 2018-01-25 | DRG: 247 | Disposition: A | Discharge status: Home Health | Payer: Medicare Other | Attending: Cardiology | Admitting: Cardiology

## 2018-01-20 ENCOUNTER — Emergency Department (HOSPITAL_COMMUNITY): Payer: Medicare Other

## 2018-01-20 ENCOUNTER — Encounter (HOSPITAL_COMMUNITY): Payer: Self-pay | Admitting: Emergency Medicine

## 2018-01-20 DIAGNOSIS — F32A Depression, unspecified: Secondary | ICD-10-CM | POA: Diagnosis present

## 2018-01-20 DIAGNOSIS — I2109 ST elevation (STEMI) myocardial infarction involving other coronary artery of anterior wall: Secondary | ICD-10-CM | POA: Diagnosis not present

## 2018-01-20 DIAGNOSIS — Z8043 Family history of malignant neoplasm of testis: Secondary | ICD-10-CM

## 2018-01-20 DIAGNOSIS — R778 Other specified abnormalities of plasma proteins: Secondary | ICD-10-CM | POA: Diagnosis present

## 2018-01-20 DIAGNOSIS — R0789 Other chest pain: Secondary | ICD-10-CM | POA: Diagnosis not present

## 2018-01-20 DIAGNOSIS — I2102 ST elevation (STEMI) myocardial infarction involving left anterior descending coronary artery: Secondary | ICD-10-CM

## 2018-01-20 DIAGNOSIS — R079 Chest pain, unspecified: Secondary | ICD-10-CM

## 2018-01-20 DIAGNOSIS — Z833 Family history of diabetes mellitus: Secondary | ICD-10-CM

## 2018-01-20 DIAGNOSIS — I10 Essential (primary) hypertension: Secondary | ICD-10-CM | POA: Diagnosis present

## 2018-01-20 DIAGNOSIS — Z818 Family history of other mental and behavioral disorders: Secondary | ICD-10-CM

## 2018-01-20 DIAGNOSIS — Z8551 Personal history of malignant neoplasm of bladder: Secondary | ICD-10-CM

## 2018-01-20 DIAGNOSIS — Z885 Allergy status to narcotic agent status: Secondary | ICD-10-CM

## 2018-01-20 DIAGNOSIS — E785 Hyperlipidemia, unspecified: Secondary | ICD-10-CM | POA: Diagnosis not present

## 2018-01-20 DIAGNOSIS — E7800 Pure hypercholesterolemia, unspecified: Secondary | ICD-10-CM | POA: Diagnosis present

## 2018-01-20 DIAGNOSIS — Z8673 Personal history of transient ischemic attack (TIA), and cerebral infarction without residual deficits: Secondary | ICD-10-CM

## 2018-01-20 DIAGNOSIS — I25119 Atherosclerotic heart disease of native coronary artery with unspecified angina pectoris: Secondary | ICD-10-CM

## 2018-01-20 DIAGNOSIS — Z955 Presence of coronary angioplasty implant and graft: Secondary | ICD-10-CM

## 2018-01-20 DIAGNOSIS — K219 Gastro-esophageal reflux disease without esophagitis: Secondary | ICD-10-CM | POA: Diagnosis present

## 2018-01-20 DIAGNOSIS — Z7982 Long term (current) use of aspirin: Secondary | ICD-10-CM

## 2018-01-20 DIAGNOSIS — Z888 Allergy status to other drugs, medicaments and biological substances status: Secondary | ICD-10-CM

## 2018-01-20 DIAGNOSIS — E1169 Type 2 diabetes mellitus with other specified complication: Secondary | ICD-10-CM | POA: Diagnosis present

## 2018-01-20 DIAGNOSIS — Z7951 Long term (current) use of inhaled steroids: Secondary | ICD-10-CM

## 2018-01-20 DIAGNOSIS — J439 Emphysema, unspecified: Secondary | ICD-10-CM | POA: Diagnosis present

## 2018-01-20 DIAGNOSIS — Z8249 Family history of ischemic heart disease and other diseases of the circulatory system: Secondary | ICD-10-CM

## 2018-01-20 DIAGNOSIS — G250 Essential tremor: Secondary | ICD-10-CM | POA: Diagnosis present

## 2018-01-20 DIAGNOSIS — F17210 Nicotine dependence, cigarettes, uncomplicated: Secondary | ICD-10-CM | POA: Diagnosis present

## 2018-01-20 DIAGNOSIS — Z823 Family history of stroke: Secondary | ICD-10-CM

## 2018-01-20 DIAGNOSIS — R7989 Other specified abnormal findings of blood chemistry: Secondary | ICD-10-CM | POA: Diagnosis present

## 2018-01-20 DIAGNOSIS — I213 ST elevation (STEMI) myocardial infarction of unspecified site: Secondary | ICD-10-CM

## 2018-01-20 DIAGNOSIS — F17200 Nicotine dependence, unspecified, uncomplicated: Secondary | ICD-10-CM | POA: Diagnosis present

## 2018-01-20 DIAGNOSIS — Z7984 Long term (current) use of oral hypoglycemic drugs: Secondary | ICD-10-CM

## 2018-01-20 DIAGNOSIS — I208 Other forms of angina pectoris: Secondary | ICD-10-CM | POA: Diagnosis present

## 2018-01-20 DIAGNOSIS — E1159 Type 2 diabetes mellitus with other circulatory complications: Secondary | ICD-10-CM

## 2018-01-20 DIAGNOSIS — G4733 Obstructive sleep apnea (adult) (pediatric): Secondary | ICD-10-CM | POA: Diagnosis present

## 2018-01-20 DIAGNOSIS — F329 Major depressive disorder, single episode, unspecified: Secondary | ICD-10-CM | POA: Diagnosis present

## 2018-01-20 DIAGNOSIS — I2089 Other forms of angina pectoris: Secondary | ICD-10-CM | POA: Diagnosis present

## 2018-01-20 DIAGNOSIS — I25110 Atherosclerotic heart disease of native coronary artery with unstable angina pectoris: Secondary | ICD-10-CM | POA: Diagnosis present

## 2018-01-20 DIAGNOSIS — J441 Chronic obstructive pulmonary disease with (acute) exacerbation: Secondary | ICD-10-CM | POA: Diagnosis present

## 2018-01-20 HISTORY — DX: ST elevation (STEMI) myocardial infarction of unspecified site: I21.3

## 2018-01-20 HISTORY — DX: Essential (primary) hypertension: I10

## 2018-01-20 LAB — BASIC METABOLIC PANEL
ANION GAP: 10 (ref 5–15)
BUN: 12 mg/dL (ref 8–23)
CO2: 22 mmol/L (ref 22–32)
Calcium: 9.6 mg/dL (ref 8.9–10.3)
Chloride: 104 mmol/L (ref 98–111)
Creatinine, Ser: 0.72 mg/dL (ref 0.61–1.24)
GFR calc Af Amer: >60 mL/min (ref >60)
Glucose, Bld: 145 mg/dL — ABNORMAL HIGH (ref 70–99)
POTASSIUM: 4.2 mmol/L (ref 3.5–5.1)
SODIUM: 136 mmol/L (ref 135–145)

## 2018-01-20 LAB — GLUCOSE, CAPILLARY: Glucose-Capillary: 168 mg/dL — ABNORMAL HIGH (ref 70–99)

## 2018-01-20 LAB — CBC
HEMATOCRIT: 50.9 % (ref 39.0–52.0)
HEMOGLOBIN: 17.6 g/dL — AB (ref 13.0–17.0)
MCH: 32.6 pg (ref 26.0–34.0)
MCHC: 34.6 g/dL (ref 30.0–36.0)
MCV: 94.3 fL (ref 78.0–100.0)
Platelets: 237 10*3/uL (ref 150–400)
RBC: 5.4 MIL/uL (ref 4.22–5.81)
RDW: 13 % (ref 11.5–15.5)
WBC: 23 10*3/uL — ABNORMAL HIGH (ref 4.0–10.5)

## 2018-01-20 LAB — D-DIMER, QUANTITATIVE (NOT AT ARMC): D DIMER QUANT: 2.08 ug{FEU}/mL — AB (ref 0.00–0.50)

## 2018-01-20 LAB — TROPONIN I
Troponin I: 0.03 ng/mL (ref <0.03)
Troponin I: 0.03 ng/mL (ref <0.03)

## 2018-01-20 LAB — MAGNESIUM: Magnesium: 1.8 mg/dL (ref 1.7–2.4)

## 2018-01-20 MED ORDER — ALPRAZOLAM 0.25 MG PO TABS: 0.2500 mg | ORAL_TABLET | Freq: Two times a day (BID) | ORAL | Status: DC | PRN

## 2018-01-20 MED ORDER — SUCRALFATE 1 GM/10ML PO SUSP
1.0000 g | Freq: Three times a day (TID) | ORAL | Status: DC
  Administered 2018-01-20 – 2018-01-25 (×18): 1 g via ORAL
  Filled 2018-01-20 (×20): qty 10

## 2018-01-20 MED ORDER — ONDANSETRON HCL 4 MG/2ML IJ SOLN: 4.0000 mg | Freq: Four times a day (QID) | INTRAMUSCULAR | Status: DC | PRN

## 2018-01-20 MED ORDER — FAMOTIDINE IN NACL 20-0.9 MG/50ML-% IV SOLN
20.0000 mg | Freq: Once | INTRAVENOUS | Status: AC
  Administered 2018-01-20: 20 mg via INTRAVENOUS
  Filled 2018-01-20: qty 50

## 2018-01-20 MED ORDER — HYDROMORPHONE HCL 1 MG/ML IJ SOLN
1.0000 mg | INTRAMUSCULAR | Status: DC | PRN
  Administered 2018-01-20 – 2018-01-21 (×4): 1 mg via INTRAVENOUS
  Filled 2018-01-20 (×4): qty 1

## 2018-01-20 MED ORDER — ASPIRIN 81 MG PO CHEW
324.0000 mg | CHEWABLE_TABLET | Freq: Once | ORAL | Status: AC
  Administered 2018-01-20: 324 mg via ORAL
  Filled 2018-01-20: qty 4

## 2018-01-20 MED ORDER — SODIUM CHLORIDE 0.9 % IV BOLUS
1000.0000 mL | Freq: Once | INTRAVENOUS | Status: AC
  Administered 2018-01-20: 1000 mL via INTRAVENOUS

## 2018-01-20 MED ORDER — MAGNESIUM SULFATE 2 GM/50ML IV SOLN
2.0000 g | Freq: Once | INTRAVENOUS | Status: AC
  Administered 2018-01-21: 2 g via INTRAVENOUS
  Filled 2018-01-20: qty 50

## 2018-01-20 MED ORDER — KETOROLAC TROMETHAMINE 30 MG/ML IJ SOLN
30.0000 mg | Freq: Once | INTRAMUSCULAR | Status: AC
  Administered 2018-01-20: 30 mg via INTRAVENOUS
  Filled 2018-01-20: qty 1

## 2018-01-20 MED ORDER — ENOXAPARIN SODIUM 40 MG/0.4ML ~~LOC~~ SOLN: 40.0000 mg | SUBCUTANEOUS | Status: DC

## 2018-01-20 MED ORDER — PANTOPRAZOLE SODIUM 40 MG PO TBEC: 40.0000 mg | DELAYED_RELEASE_TABLET | Freq: Every day | ORAL | Status: DC

## 2018-01-20 MED ORDER — ACETAMINOPHEN 325 MG PO TABS: 650.0000 mg | ORAL_TABLET | ORAL | Status: DC | PRN

## 2018-01-20 MED ORDER — ALUM & MAG HYDROXIDE-SIMETH 200-200-20 MG/5ML PO SUSP
30.0000 mL | Freq: Once | ORAL | Status: AC
  Administered 2018-01-20: 30 mL via ORAL
  Filled 2018-01-20: qty 30

## 2018-01-20 MED ORDER — IPRATROPIUM-ALBUTEROL 0.5-2.5 (3) MG/3ML IN SOLN
3.0000 mL | Freq: Four times a day (QID) | RESPIRATORY_TRACT | Status: DC
  Administered 2018-01-20: 3 mL via RESPIRATORY_TRACT
  Filled 2018-01-20: qty 3

## 2018-01-20 MED ORDER — METHYLPREDNISOLONE SODIUM SUCC 125 MG IJ SOLR
125.0000 mg | Freq: Once | INTRAMUSCULAR | Status: AC
  Administered 2018-01-21: 125 mg via INTRAVENOUS
  Filled 2018-01-20: qty 2

## 2018-01-20 MED ORDER — IOPAMIDOL (ISOVUE-370) INJECTION 76%
100.0000 mL | Freq: Once | INTRAVENOUS | Status: AC | PRN
  Administered 2018-01-20: 100 mL via INTRAVENOUS

## 2018-01-20 MED ORDER — MORPHINE SULFATE (PF) 4 MG/ML IV SOLN
4.0000 mg | Freq: Once | INTRAVENOUS | Status: AC
  Administered 2018-01-20: 4 mg via INTRAVENOUS
  Filled 2018-01-20: qty 1

## 2018-01-20 MED ORDER — ENOXAPARIN SODIUM 40 MG/0.4ML ~~LOC~~ SOLN
40.0000 mg | SUBCUTANEOUS | Status: DC
  Administered 2018-01-20: 40 mg via SUBCUTANEOUS
  Filled 2018-01-20: qty 0.4

## 2018-01-20 NOTE — ED Provider Notes (Signed)
[facility] EMERGENCY DEPARTMENT Provider Note   CSN: [id number] Arrival date & time: 01/20/18  1722     History   Chief Complaint Chief Complaint  Patient presents with  . Chest Pain    HPI Andrew [name] is a 67 y.o. male.  HPI Patient presents concern of severe sharp chest pain. Pain began about 4 hours prior to my arrival, though it may have been present minimally prior to this, it is been severe since that time. Pain is focally in the sternum, nonradiating, worse with inspiration, motion. No true dyspnea, though he cannot breathe secondary to pain in this area. No syncope There is associated nausea, and the patient feels as though he has to burp. This activity seems to improve his symptoms transiently.  He has multiple medical issues, denies history of cardiac disease.  He does have a history of GERD, states that this does not feel like that.   Past Medical History:  Diagnosis Date  . Allergy   . Anemia   . Arthritis   . Cancer [facility])    Bladder  . Depression   . Diabetes mellitus without complication ([place])   . GERD (gastroesophageal reflux disease)   . Hypercholesteremia   . Hyperlipidemia   . Hypertension    in the past no longer on medication  . Neuromuscular disorder ([place])   . Sleep apnea    BiPAP no used in 3 years  . Status post tendon repair 1989  . Stroke ([place])    At times pt has dizziness with loss of vision  . Tremors of nervous system 2011    Patient Active Problem List   Diagnosis Date Noted  . Sleep apnea 10/06/2016  . Back spasm 01/15/2016  . Generalized edema 01/15/2016  . Tobacco use disorder 12/19/2015  . Diabetes ([place]) 12/19/2015  . Hyperlipidemia 12/19/2015  . Bladder cancer ([place]) 12/19/2015  . Esophageal reflux 12/19/2015  . Essential tremor 12/19/2015  . History of stroke 12/19/2015    Past Surgical History:  Procedure Laterality Date  . APPENDECTOMY    . BLADDER SURGERY    . COLONOSCOPY    . CYSTOSCOPY W/ RETROGRADES  Bilateral 08/22/2015   Procedure: CYSTOSCOPY WITH RETROGRADE PYELOGRAM;  Surgeon: Irine Seal, MD;  Location: [facility];  Service: Urology;  Laterality: Bilateral;  . CYSTOSCOPY W/ RETROGRADES Bilateral 01/16/2016   Procedure: CYSTOSCOPY WITH RETROGRADE PYELOGRAM;  Surgeon: Irine Seal, MD;  Location: [facility];  Service: Urology;  Laterality: Bilateral;  . CYSTOSCOPY W/ RETROGRADES Bilateral 01/07/2017   Procedure: CYSTOSCOPY WITH RETROGRADE PYELOGRAM;  Surgeon: Irine Seal, MD;  Location: [facility];  Service: Urology;  Laterality: Bilateral;  . CYSTOSCOPY WITH BIOPSY N/A 08/22/2015   Procedure: CYSTOSCOPY WITH BLADDER BIOPSY;  Surgeon: Irine Seal, MD;  Location: [facility];  Service: Urology;  Laterality: N/A;  . CYSTOSCOPY WITH BIOPSY N/A 01/16/2016   Procedure: CYSTOSCOPY WITH BLADDER BIOPSY;  Surgeon: Irine Seal, MD;  Location: [facility];  Service: Urology;  Laterality: N/A;  . CYSTOSCOPY WITH FULGERATION N/A 01/16/2016   Procedure: CYSTOSCOPY WITH FULGERATION;  Surgeon: Irine Seal, MD;  Location: [facility];  Service: Urology;  Laterality: N/A;  . KNEE ARTHROSCOPY Right 1989  . ROTATOR CUFF REPAIR Bilateral 2000  . rt. leg fracture surgery    . TRANSURETHRAL RESECTION OF BLADDER TUMOR N/A 01/07/2017   Procedure: TRANSURETHRAL RESECTION OF BLADDER TUMOR (TURBT);  Surgeon: Irine Seal, MD;  Location: [facility];  Service: Urology;  Laterality: N/A;  . WISDOM TOOTH EXTRACTION  Home Medications    Prior to Admission medications   Medication Sig Start Date End Date Taking? Authorizing Provider  aspirin 81 MG tablet Take 81 mg by mouth daily.   Yes [provider]  budesonide-formoterol (SYMBICORT) 80-4.5 MCG/ACT inhaler Inhale 2 puffs into the lungs 2 (two) times daily. 01/13/18  Yes Eustaquio Maize, MD  buPROPion [facility] SR) 150 MG 12 hr tablet Take 1 tablet (150 mg total) by mouth 2 (two) times daily. 01/06/18  Yes Eustaquio Maize, MD  dapagliflozin propanediol (FARXIGA) 10 MG TABS tablet Take  10 mg by mouth daily. 01/06/18  Yes Eustaquio Maize, MD  furosemide (LASIX) 20 MG tablet Take 1 tablet (20 mg total) by mouth daily as needed. 08/11/17  Yes Eustaquio Maize, MD  lisinopril (PRINIVIL,ZESTRIL) 10 MG tablet Take 1 tablet (10 mg total) by mouth daily. 01/06/18  Yes Eustaquio Maize, MD  metFORMIN (GLUCOPHAGE) 1000 MG tablet Take 1 tablet (1,000 mg total) by mouth 2 (two) times daily with a meal. 07/07/17  Yes Eustaquio Maize, MD  pravastatin (PRAVACHOL) 80 MG tablet Take 1 tablet (80 mg total) by mouth daily. 10/06/17  Yes Eustaquio Maize, MD  primidone (MYSOLINE) 50 MG tablet Take 1 tablet (50 mg total) by mouth 4 (four) times daily. 01/06/18  Yes Eustaquio Maize, MD  sitaGLIPtin (JANUVIA) 100 MG tablet Take 1 tablet (100 mg total) by mouth daily. 01/06/18  Yes Eustaquio Maize, MD  naproxen sodium (ANAPROX) 220 MG tablet Take 440-660 mg by mouth 2 (two) times daily as needed (pain).    [provider]  omeprazole (PRILOSEC) 20 MG capsule TAKE (1) CAPSULE DAILY 11/04/17   Eustaquio Maize, MD    Family History Family History  Problem Relation Age of Onset  . Diabetes Mother 64       type 1  . Stroke Mother   . Dementia Father   . Diabetes Brother   . Heart attack Brother 71  . Testicular cancer Brother   . Diabetes Brother   . Cancer Brother        testicular  . Colon cancer Neg Hx   . Colon polyps Neg Hx   . Esophageal cancer Neg Hx   . Rectal cancer Neg Hx   . Stomach cancer Neg Hx     Social History Social History   Tobacco Use  . Smoking status: Current Every Day Smoker    Packs/day: 1.00    Years: 42.00    Pack years: 42.00    Types: Cigarettes  . Smokeless tobacco: Former Network engineer Use Topics  . Alcohol use: No    Comment: rarely  . Drug use: No     Allergies   Tramadol and Trulicity [dulaglutide]   Review of Systems Review of Systems  Constitutional:       Per HPI, otherwise negative  HENT:       Per HPI, otherwise negative    Respiratory:       Per HPI, otherwise negative  Cardiovascular:       Per HPI, otherwise negative  Gastrointestinal: Positive for abdominal pain and nausea. Negative for vomiting.  Endocrine:       Negative aside from HPI  Genitourinary:       Neg aside from HPI   Musculoskeletal:       Per HPI, otherwise negative  Skin: Negative.   Neurological: Negative for syncope.     Physical Exam Updated Vital Signs  BP 121/63 (BP Location: Left Arm)   Pulse (!) 103   Temp 98.6 F (37 C) (Oral)   Resp (!) 24   Ht 6' 2.25" (1.886 m)   Wt 93.9 kg   SpO2 97%   BMI 26.40 kg/m   Physical Exam  Constitutional: He is oriented to person, place, and time. He appears well-developed. He appears distressed.  Uncomfortable appearing elderly male awake and alert  HENT:  Head: Normocephalic and atraumatic.  Eyes: Conjunctivae and EOM are normal.  Cardiovascular: Normal rate and regular rhythm.  Pulmonary/Chest: Effort normal. No stridor. No respiratory distress.  Abdominal: He exhibits no distension. There is tenderness in the epigastric area.  Musculoskeletal: He exhibits no edema.  Neurological: He is alert and oriented to person, place, and time.  Skin: Skin is warm and dry.  Psychiatric: He has a normal mood and affect.  Nursing note and vitals reviewed.    ED Treatments / Results  Labs (all labs ordered are listed, but only abnormal results are displayed) Labs Reviewed  BASIC METABOLIC PANEL - Abnormal; Notable for the following components:      Result Value   Glucose, Bld 145 (*)    All other components within normal limits  CBC - Abnormal; Notable for the following components:   WBC 23.0 (*)    Hemoglobin 17.6 (*)    All other components within normal limits  TROPONIN I - Abnormal; Notable for the following components:   Troponin I 0.03 (*)    All other components within normal limits  D-DIMER, QUANTITATIVE (NOT AT [facility]) - Abnormal; Notable for the following components:    D-Dimer, Quant 2.08 (*)    All other components within normal limits  TROPONIN I  MAGNESIUM    EKG EKG with sinus tachycardia, rate 104, biatrial enlargement, abnormal EKG  Radiology Dg Chest 2 View  Result Date: 01/20/2018 CLINICAL DATA:  Mid chest pain beginning today. EXAM: CHEST - 2 VIEW COMPARISON:  03/28/2017 FINDINGS: Normal heart size with mild aortic atherosclerosis. No aneurysm. Chronic bronchitic change of the lungs without alveolar consolidation. Osteoarthritis of the [facility] and glenohumeral joints. Surgical anchors project over the right humeral head. No effusion or pneumothorax. Stable mild degenerative change along the dorsal spine. IMPRESSION: Chronic bronchitic change with increased interstitial lung markings noted bilaterally. Mild aortic atherosclerosis without aneurysm. Electronically Signed   By: Ashley Royalty M.D.   On: 01/20/2018 17:49   Ct Angio Chest/abd/pel For Dissection W And/or W/wo  Result Date: 01/20/2018 CLINICAL DATA:  Per ED notes: Pt reports chest pain that began around 1300 this afternoon while sitting on couch. Pt reports pain is worse with movement and inspiration. Pt belching in triage. NAD noted. Airway patent. EXAM: CT ANGIOGRAPHY CHEST, ABDOMEN AND PELVIS TECHNIQUE: Multidetector CT imaging through the chest, abdomen and pelvis was performed using the standard protocol during bolus administration of intravenous contrast. Multiplanar reconstructed images and MIPs were obtained and reviewed to evaluate the vascular anatomy. CONTRAST:  154mL ISOVUE-370 IOPAMIDOL (ISOVUE-370) INJECTION 76% COMPARISON:  CT of the abdomen and pelvis on 01/14/2017 FINDINGS: CTA CHEST FINDINGS Cardiovascular: Heart size is normal. There is atherosclerotic calcification of the coronary arteries and thoracic aorta. No displaced intimal calcifications. With contrast administration there is no dissection. No pericardial effusion. Normal appearance of the pulmonary arteries accounting for the  contrast bolus timing. Mediastinum/Nodes: The visualized portion of the thyroid gland has a normal appearance. Small mediastinal and hilar lymph nodes measure less than 1.5 centimeters in short axis  and are likely reactive. Esophagus is normal in appearance. Lungs/Pleura: Emphysematous changes are identified at the lung apices. There are subpleural reticular changes primarily within the dependent portions of the lungs. No consolidations or pleural effusions. Musculoskeletal: No chest wall abnormality. No acute or significant osseous findings. Review of the MIP images confirms the above findings. CTA ABDOMEN AND PELVIS FINDINGS VASCULAR Aorta: There is mild atherosclerotic calcification of the abdominal aorta. No aneurysm. Celiac: There is atherosclerotic calcification at the origin of the celiac axis, not associated with significant narrowing. SMA: Patent without evidence of aneurysm, dissection, vasculitis or significant stenosis. Renals: Single renal arteries bilaterally are patent. IMA: Patent without evidence of aneurysm, dissection, vasculitis or significant stenosis. Inflow: Patent without evidence of aneurysm, dissection, vasculitis or significant stenosis. Veins: No obvious venous abnormality within the limitations of this arterial phase study. Review of the MIP images confirms the above findings. NON-VASCULAR Hepatobiliary: No focal liver abnormality is seen. No radiopaque gallstones, biliary dilatation, or pericholecystic inflammatory changes. Pancreas: Unremarkable. No pancreatic ductal dilatation or surrounding inflammatory changes. Spleen: Numerous splenic granulomata are present. Adrenals/Urinary Tract: Adrenal glands are unremarkable. Kidneys are normal, without renal calculi, focal lesion, or hydronephrosis. Bladder is unremarkable. Stomach/Bowel: The stomach and small bowel loops are normal in appearance. Appendectomy. Large bowel is normal in appearance. Lymphatic: No significant vascular findings  are present. No enlarged abdominal or pelvic lymph nodes. Reproductive: Prostate is unremarkable. Other: No abdominal wall hernia or abnormality. No abdominopelvic ascites. Review of the MIP images confirms the above findings. IMPRESSION: 1. No evidence for dissection or aneurysm. 2. Aortic atherosclerosis.  (ICD10-I70.0) 3. Coronary atherosclerosis. 4.  Emphysema (ICD10-J43.9). 5. Prior granulomatous disease. 6. Appendectomy.  No bowel obstruction. Electronically Signed   By: Nolon Nations M.D.   On: 01/20/2018 20:01    Procedures Procedures (including critical care time)  Medications Ordered in ED Medications  famotidine (PEPCID) IVPB 20 mg premix (has no administration in time range)  morphine 4 MG/ML injection 4 mg (has no administration in time range)  alum & mag hydroxide-simeth (MAALOX/MYLANTA) 200-200-20 MG/5ML suspension 30 mL (30 mLs Oral Given 01/20/18 1811)  sodium chloride 0.9 % bolus 1,000 mL (1,000 mLs Intravenous New Bag/Given 01/20/18 1902)  iopamidol (ISOVUE-370) 76 % injection 100 mL (100 mLs Intravenous Contrast Given 01/20/18 1908)     Initial Impression / Assessment and Plan / ED Course  I have reviewed the triage vital signs and the nursing notes.  Pertinent labs & imaging results that were available during my care of the patient were reviewed by me and considered in my medical decision making (see chart for details).    After the initial evaluation, with concern for disection vs. ACS vs. PE, the patient was placed on  Continuous monitor, and had NG, ASA, IVF, and CXR, CT were prepared.  I discussed his presentation w CT staff to try to achieve imaging of both aorta and pulmonary vasculature,though PE was lower on the differential diagnosis.   Patient required additional analgesia, beyond initial gi cocktail, aspirin, morphine, and nitroglycerin.  8:41 PM Patient remains in pain, heart rate about the same. I reviewed initial findings with him, including generally  reassuring CT scan, x-ray, without evidence for obvious dissection. Patient remains with normal saturation, with no supplemental oxygen, and PE was not visualized on CT, nor is it most consistent with presentation. Patient did have mild elevation in troponin on both the first and second troponin, though his initial EKG was reassuring. Patient will receive dilaudid, require admission  for further evaluation and management.   Final Clinical Impressions(s) / ED Diagnoses  Atypical chest pain   Carmin Muskrat, MD 01/20/18 2118

## 2018-01-20 NOTE — ED Triage Notes (Signed)
Pt reports chest pain that began around 1300 this afternoon while sitting on couch. Pt reports pain is worse with movement and inspiration. Pt belching in triage. nad noted. Airway patent. Pt denies emesis but reports nausea and intermittent dizziness.

## 2018-01-20 NOTE — ED Notes (Signed)
Patient transported to CT 

## 2018-01-20 NOTE — H&P (Addendum)
History and Physical    Andrew [name] YIF:[id number] DOB: January 29, 1951 DOA: 01/20/2018  PCP: Eustaquio Maize, MD   Patient coming from: Home.  I have personally briefly reviewed patient's old medical records in [place]  Chief Complaint: Chest pain.  HPI: Andrew [name] is a 67 y.o. male with medical history significant of seasonal allergies, anemia, osteoarthritis, bladder cancer, depression, type 2 diabetes, GERD, hyperlipidemia, hypertension, sleep apnea not on BiPAP anymore, history of CVA, history of tremors who is coming to the emergency department due to chest pain since late in the morning tachycardia getting progressively worse until around 1300 when it became very intense while he was sitting on his couch associated with mild dyspnea, nausea, but no emesis and intermittent dizziness.  It is central in location, sharp in nature, non-radiated, worsened by deep inspiration and partially relieved by opioid analgesics.  He gets occasional pitting edema of the lower extremities, but denies orthopnea or PND.  He denies headache, fever, sore throat, but complains of wheezing and cough, which is occasionally productive.  No hemoptysis.  Denies abdominal pain, diarrhea, constipation, melena or hematochezia.  Denies dysuria, frequency or hematuria.  No polyuria, polydipsia, polyphagia or blurred vision.  ED Course: Initial vital signs temperature 98.6 F, pulse 104, respirations 18, blood pressure 119/63 mmHg and O2 sat 95% on room air.  The patient received aspirin, morphine, and 1 L NS bolus and nitroglycerin with partial relief of pain.  His white count was 23,000, hemoglobin 17.6 g/dL and platelets 237.  D-dimer was 2.08.  Troponin x2 was 0.03 ng/mL.  EKG was reassuring per Dr. Vanita Panda.  BMP showed a glucose of 145 mg/dL, but is otherwise normal.  Levels 1.8 mg/dL.  Imaging: His chest radiograph showed chronic bronchitic change with increased interstitial lung markings noted bilaterally.   There was mild aortic atherosclerosis with aneurysm.  CTA chest/abdomen/pelvis did not show any PE, dissection or aneurysm.  There was aortic and coronary atherosclerosis.  Emphysema, prior granulomatous disease.  Please see images and full radiology report for further detail.  Review of Systems: As per HPI otherwise 10 point review of systems negative.   Past Medical History:  Diagnosis Date  . Allergy   . Anemia   . Arthritis   . Cancer [facility])    Bladder  . Depression   . Diabetes mellitus without complication ([place])   . GERD (gastroesophageal reflux disease)   . Hypercholesteremia   . Hyperlipidemia   . Hypertension    in the past no longer on medication  . Neuromuscular disorder ([place])   . Sleep apnea    BiPAP no used in 3 years  . Status post tendon repair 1989  . Stroke ([place])    At times pt has dizziness with loss of vision  . Tremors of nervous system 2011    Past Surgical History:  Procedure Laterality Date  . APPENDECTOMY    . BLADDER SURGERY    . COLONOSCOPY    . CYSTOSCOPY W/ RETROGRADES Bilateral 08/22/2015   Procedure: CYSTOSCOPY WITH RETROGRADE PYELOGRAM;  Surgeon: Irine Seal, MD;  Location: [facility];  Service: Urology;  Laterality: Bilateral;  . CYSTOSCOPY W/ RETROGRADES Bilateral 01/16/2016   Procedure: CYSTOSCOPY WITH RETROGRADE PYELOGRAM;  Surgeon: Irine Seal, MD;  Location: [facility];  Service: Urology;  Laterality: Bilateral;  . CYSTOSCOPY W/ RETROGRADES Bilateral 01/07/2017   Procedure: CYSTOSCOPY WITH RETROGRADE PYELOGRAM;  Surgeon: Irine Seal, MD;  Location: [facility];  Service: Urology;  Laterality: Bilateral;  . CYSTOSCOPY  WITH BIOPSY N/A 08/22/2015   Procedure: CYSTOSCOPY WITH BLADDER BIOPSY;  Surgeon: Irine Seal, MD;  Location: [facility];  Service: Urology;  Laterality: N/A;  . CYSTOSCOPY WITH BIOPSY N/A 01/16/2016   Procedure: CYSTOSCOPY WITH BLADDER BIOPSY;  Surgeon: Irine Seal, MD;  Location: [facility];  Service: Urology;  Laterality: N/A;  . CYSTOSCOPY WITH  FULGERATION N/A 01/16/2016   Procedure: CYSTOSCOPY WITH FULGERATION;  Surgeon: Irine Seal, MD;  Location: [facility];  Service: Urology;  Laterality: N/A;  . KNEE ARTHROSCOPY Right 1989  . ROTATOR CUFF REPAIR Bilateral 2000  . rt. leg fracture surgery    . TRANSURETHRAL RESECTION OF BLADDER TUMOR N/A 01/07/2017   Procedure: TRANSURETHRAL RESECTION OF BLADDER TUMOR (TURBT);  Surgeon: Irine Seal, MD;  Location: [facility];  Service: Urology;  Laterality: N/A;  . WISDOM TOOTH EXTRACTION       reports that he has been smoking cigarettes. He has a 42.00 pack-year smoking history. He has quit using smokeless tobacco. He reports that he does not drink alcohol or use drugs.  Allergies  Allergen Reactions  . Tramadol Shortness Of Breath    And dizziness  . Trulicity [Dulaglutide] Swelling    Family History  Problem Relation Age of Onset  . Diabetes Mother 57       type 1  . Stroke Mother   . Dementia Father   . Diabetes Brother   . Heart attack Brother 67  . Testicular cancer Brother   . Diabetes Brother   . Cancer Brother        testicular  . Colon cancer Neg Hx   . Colon polyps Neg Hx   . Esophageal cancer Neg Hx   . Rectal cancer Neg Hx   . Stomach cancer Neg Hx     Prior to Admission medications   Medication Sig Start Date End Date Taking? Authorizing Provider  aspirin 81 MG tablet Take 81 mg by mouth daily.   Yes [provider]  budesonide-formoterol (SYMBICORT) 80-4.5 MCG/ACT inhaler Inhale 2 puffs into the lungs 2 (two) times daily. 01/13/18  Yes Eustaquio Maize, MD  buPROPion [facility] SR) 150 MG 12 hr tablet Take 1 tablet (150 mg total) by mouth 2 (two) times daily. 01/06/18  Yes Eustaquio Maize, MD  dapagliflozin propanediol (FARXIGA) 10 MG TABS tablet Take 10 mg by mouth daily. 01/06/18  Yes Eustaquio Maize, MD  furosemide (LASIX) 20 MG tablet Take 1 tablet (20 mg total) by mouth daily as needed. 08/11/17  Yes Eustaquio Maize, MD  lisinopril (PRINIVIL,ZESTRIL) 10 MG  tablet Take 1 tablet (10 mg total) by mouth daily. 01/06/18  Yes Eustaquio Maize, MD  metFORMIN (GLUCOPHAGE) 1000 MG tablet Take 1 tablet (1,000 mg total) by mouth 2 (two) times daily with a meal. 07/07/17  Yes Eustaquio Maize, MD  pravastatin (PRAVACHOL) 80 MG tablet Take 1 tablet (80 mg total) by mouth daily. 10/06/17  Yes Eustaquio Maize, MD  primidone (MYSOLINE) 50 MG tablet Take 1 tablet (50 mg total) by mouth 4 (four) times daily. 01/06/18  Yes Eustaquio Maize, MD  sitaGLIPtin (JANUVIA) 100 MG tablet Take 1 tablet (100 mg total) by mouth daily. 01/06/18  Yes Eustaquio Maize, MD  naproxen sodium (ANAPROX) 220 MG tablet Take 440-660 mg by mouth 2 (two) times daily as needed (pain).    [provider]  omeprazole (PRILOSEC) 20 MG capsule TAKE (1) CAPSULE DAILY 11/04/17   Eustaquio Maize, MD  Physical Exam: Vitals:   01/20/18 2000 01/20/18 2050 01/20/18 2100 01/20/18 2130  BP: 126/82 133/85 125/87 129/85  Pulse: (!) 104 (!) 108 (!) 102 (!) 105  Resp:  13 15 14   Temp:      TempSrc:      SpO2: 96% 92% 94% 93%  Weight:      Height:        Constitutional: NAD, calm, comfortable Eyes: PERRL, lids and conjunctivae normal ENMT: Mucous membranes are moist. Posterior pharynx clear of any exudate or lesions. Neck: normal, supple, no masses, no thyromegaly Respiratory: Decreased breath sounds with bilateral wheezing, no crackles. Normal respiratory effort. No accessory muscle use.  Cardiovascular: Regular rate and rhythm, no murmurs / rubs / gallops. No extremity edema. 2+ pedal pulses. No carotid bruits.  Abdomen: Soft, no tenderness, no masses palpated. No hepatosplenomegaly. Bowel sounds positive.  Musculoskeletal: no clubbing / cyanosis. Good ROM, no contractures. Normal muscle tone.  Skin: no rashes, lesions, ulcers on limited examination. Neurologic: CN 2-12 grossly intact. Sensation intact, DTR normal. Strength 5/5 in all 4.  Psychiatric: Normal judgment and insight. Alert  and oriented x 4. Normal mood.    Labs on Admission: I have personally reviewed following labs and imaging studies  CBC: Recent Labs  Lab 01/20/18 1730  WBC 23.0*  HGB 17.6*  HCT 50.9  MCV 94.3  PLT 063   Basic Metabolic Panel: Recent Labs  Lab 01/20/18 1730 01/20/18 2011  NA 136  --   K 4.2  --   CL 104  --   CO2 22  --   GLUCOSE 145*  --   BUN 12  --   CREATININE 0.72  --   CALCIUM 9.6  --   MG  --  1.8   GFR: Estimated Creatinine Clearance: 104.9 mL/min (by C-G formula based on SCr of 0.72 mg/dL). Liver Function Tests: No results for input(s): AST, ALT, ALKPHOS, BILITOT, PROT, ALBUMIN in the last 168 hours. No results for input(s): LIPASE, AMYLASE in the last 168 hours. No results for input(s): AMMONIA in the last 168 hours. Coagulation Profile: No results for input(s): INR, PROTIME in the last 168 hours. Cardiac Enzymes: Recent Labs  Lab 01/20/18 1730 01/20/18 2011  TROPONINI 0.03* 0.03*   BNP (last 3 results) No results for input(s): PROBNP in the last 8760 hours. HbA1C: No results for input(s): HGBA1C in the last 72 hours. CBG: No results for input(s): GLUCAP in the last 168 hours. Lipid Profile: No results for input(s): CHOL, HDL, LDLCALC, TRIG, CHOLHDL, LDLDIRECT in the last 72 hours. Thyroid Function Tests: No results for input(s): TSH, T4TOTAL, FREET4, T3FREE, THYROIDAB in the last 72 hours. Anemia Panel: No results for input(s): VITAMINB12, FOLATE, FERRITIN, TIBC, IRON, RETICCTPCT in the last 72 hours. Urine analysis:    Component Value Date/Time   COLORURINE YELLOW 01/14/2017 1149   APPEARANCEUR CLEAR 01/14/2017 1149   APPEARANCEUR Clear 01/15/2016 0902   LABSPEC 1.028 01/14/2017 1149   PHURINE 5.0 01/14/2017 1149   GLUCOSEU 150 (A) 01/14/2017 1149   HGBUR NEGATIVE 01/14/2017 [place] 01/14/2017 1149   BILIRUBINUR Negative 01/15/2016 0902   KETONESUR NEGATIVE 01/14/2017 1149   PROTEINUR 30 (A) 01/14/2017 1149    NITRITE NEGATIVE 01/14/2017 1149   LEUKOCYTESUR NEGATIVE 01/14/2017 1149   LEUKOCYTESUR Negative 01/15/2016 0902    Radiological Exams on Admission: Dg Chest 2 View  Result Date: 01/20/2018 CLINICAL DATA:  Mid chest pain beginning today. EXAM: CHEST - 2 VIEW COMPARISON:  03/28/2017 FINDINGS: Normal heart size with mild aortic atherosclerosis. No aneurysm. Chronic bronchitic change of the lungs without alveolar consolidation. Osteoarthritis of the [facility] and glenohumeral joints. Surgical anchors project over the right humeral head. No effusion or pneumothorax. Stable mild degenerative change along the dorsal spine. IMPRESSION: Chronic bronchitic change with increased interstitial lung markings noted bilaterally. Mild aortic atherosclerosis without aneurysm. Electronically Signed   By: Ashley Royalty M.D.   On: 01/20/2018 17:49   Ct Angio Chest/abd/pel For Dissection W And/or W/wo  Result Date: 01/20/2018 CLINICAL DATA:  Per ED notes: Pt reports chest pain that began around 1300 this afternoon while sitting on couch. Pt reports pain is worse with movement and inspiration. Pt belching in triage. NAD noted. Airway patent. EXAM: CT ANGIOGRAPHY CHEST, ABDOMEN AND PELVIS TECHNIQUE: Multidetector CT imaging through the chest, abdomen and pelvis was performed using the standard protocol during bolus administration of intravenous contrast. Multiplanar reconstructed images and MIPs were obtained and reviewed to evaluate the vascular anatomy. CONTRAST:  136mL ISOVUE-370 IOPAMIDOL (ISOVUE-370) INJECTION 76% COMPARISON:  CT of the abdomen and pelvis on 01/14/2017 FINDINGS: CTA CHEST FINDINGS Cardiovascular: Heart size is normal. There is atherosclerotic calcification of the coronary arteries and thoracic aorta. No displaced intimal calcifications. With contrast administration there is no dissection. No pericardial effusion. Normal appearance of the pulmonary arteries accounting for the contrast bolus timing.  Mediastinum/Nodes: The visualized portion of the thyroid gland has a normal appearance. Small mediastinal and hilar lymph nodes measure less than 1.5 centimeters in short axis and are likely reactive. Esophagus is normal in appearance. Lungs/Pleura: Emphysematous changes are identified at the lung apices. There are subpleural reticular changes primarily within the dependent portions of the lungs. No consolidations or pleural effusions. Musculoskeletal: No chest wall abnormality. No acute or significant osseous findings. Review of the MIP images confirms the above findings. CTA ABDOMEN AND PELVIS FINDINGS VASCULAR Aorta: There is mild atherosclerotic calcification of the abdominal aorta. No aneurysm. Celiac: There is atherosclerotic calcification at the origin of the celiac axis, not associated with significant narrowing. SMA: Patent without evidence of aneurysm, dissection, vasculitis or significant stenosis. Renals: Single renal arteries bilaterally are patent. IMA: Patent without evidence of aneurysm, dissection, vasculitis or significant stenosis. Inflow: Patent without evidence of aneurysm, dissection, vasculitis or significant stenosis. Veins: No obvious venous abnormality within the limitations of this arterial phase study. Review of the MIP images confirms the above findings. NON-VASCULAR Hepatobiliary: No focal liver abnormality is seen. No radiopaque gallstones, biliary dilatation, or pericholecystic inflammatory changes. Pancreas: Unremarkable. No pancreatic ductal dilatation or surrounding inflammatory changes. Spleen: Numerous splenic granulomata are present. Adrenals/Urinary Tract: Adrenal glands are unremarkable. Kidneys are normal, without renal calculi, focal lesion, or hydronephrosis. Bladder is unremarkable. Stomach/Bowel: The stomach and small bowel loops are normal in appearance. Appendectomy. Large bowel is normal in appearance. Lymphatic: No significant vascular findings are present. No  enlarged abdominal or pelvic lymph nodes. Reproductive: Prostate is unremarkable. Other: No abdominal wall hernia or abnormality. No abdominopelvic ascites. Review of the MIP images confirms the above findings. IMPRESSION: 1. No evidence for dissection or aneurysm. 2. Aortic atherosclerosis.  (ICD10-I70.0) 3. Coronary atherosclerosis. 4.  Emphysema (ICD10-J43.9). 5. Prior granulomatous disease. 6. Appendectomy.  No bowel obstruction. Electronically Signed   By: Nolon Nations M.D.   On: 01/20/2018 20:01    EKG:  Unable to find paper tracing. EKG is reassuring per Dr. Vanita Panda note.  Assessment/Plan Principal Problem:   Chest pain Pleuritic chest pain highly suspicious for pericarditis. Observation/telemetry.  Continue supplemental oxygen. Trend troponin levels. Continue aspirin. Check EKG in a.m. Check echocardiogram in a.m.  Active Problems:   Elevated troponin As above. Trend level.    COPD with acute exacerbation (HCC) Continue supplemental oxygen. Continue Solu-Medrol. Schedule and as needed bronchodilators. Smoking cessation advised.    Elevated d-dimer CTA of chest was reassuring.      Leukocytosis No obvious source or fever. Work up does not demonstrate possible etiologies so far. Follow-up WBC level in a.m. Consider outpatient hematology evaluation if persistent.    Type 2 diabetes mellitus ([place]) Continue Invokana or formulary equivalent. Continue metformin 1000 mg p.o. twice daily. CBG monitoring with regular insulin sliding scale while on steroids.    Hyperlipidemia Continue pravastatin.    Esophageal reflux Protonix 40 mg p.o. daily.    Essential tremor Continue primidone.    Depression Continue Wellbutrin SR 150 mg p.o. twice daily.    Hypertension Continue lisinopril 5 mg p.o. daily. Monitor blood pressure, renal function electrolytes.    Tobacco abuse Nicotine replacement therapy ordered. Staff to provide smoking cessation  information.   DVT prophylaxis: SQ Lovenox. Code Status: Code full. Family Communication:  Disposition Plan: Observation for CP. Consults called:  Admission status: Observation/telemetry.   Reubin Milan MD [facility] Pager [phone].  If 7PM-7AM, please contact night-coverage [email or web address] Password [facility]  01/20/2018, 9:52 PM    Addendum: I was paged [phone] in regards to Mr. Hofland morning EKG.  It is showing significant ST segment elevation on anterior and inferior leads.  Heparin was started, metoprolol 5 mg IVP given.  He received aspirin in the emergency department.  The case was discussed with Dr. Johnny Bridge from the cardiology service.  I am arranging for transfer to [facility] for cardiology evaluation.   Tennis Must, MD.

## 2018-01-21 ENCOUNTER — Observation Stay (HOSPITAL_COMMUNITY): Payer: Medicare Other

## 2018-01-21 ENCOUNTER — Inpatient Hospital Stay (HOSPITAL_COMMUNITY)
Admission: EM | Disposition: A | Discharge status: Home Health | Payer: Self-pay | Source: Home / Self Care | Attending: Cardiology

## 2018-01-21 DIAGNOSIS — R7989 Other specified abnormal findings of blood chemistry: Secondary | ICD-10-CM | POA: Diagnosis not present

## 2018-01-21 DIAGNOSIS — J439 Emphysema, unspecified: Secondary | ICD-10-CM | POA: Diagnosis present

## 2018-01-21 DIAGNOSIS — I2109 ST elevation (STEMI) myocardial infarction involving other coronary artery of anterior wall: Secondary | ICD-10-CM | POA: Diagnosis not present

## 2018-01-21 DIAGNOSIS — Z8551 Personal history of malignant neoplasm of bladder: Secondary | ICD-10-CM | POA: Diagnosis not present

## 2018-01-21 DIAGNOSIS — E7800 Pure hypercholesterolemia, unspecified: Secondary | ICD-10-CM | POA: Diagnosis not present

## 2018-01-21 DIAGNOSIS — I2121 ST elevation (STEMI) myocardial infarction involving left circumflex coronary artery: Secondary | ICD-10-CM | POA: Diagnosis not present

## 2018-01-21 DIAGNOSIS — K219 Gastro-esophageal reflux disease without esophagitis: Secondary | ICD-10-CM

## 2018-01-21 DIAGNOSIS — R748 Abnormal levels of other serum enzymes: Secondary | ICD-10-CM | POA: Diagnosis not present

## 2018-01-21 DIAGNOSIS — Z8673 Personal history of transient ischemic attack (TIA), and cerebral infarction without residual deficits: Secondary | ICD-10-CM | POA: Diagnosis not present

## 2018-01-21 DIAGNOSIS — I10 Essential (primary) hypertension: Secondary | ICD-10-CM | POA: Diagnosis not present

## 2018-01-21 DIAGNOSIS — E785 Hyperlipidemia, unspecified: Secondary | ICD-10-CM | POA: Diagnosis not present

## 2018-01-21 DIAGNOSIS — Z8249 Family history of ischemic heart disease and other diseases of the circulatory system: Secondary | ICD-10-CM | POA: Diagnosis not present

## 2018-01-21 DIAGNOSIS — E1169 Type 2 diabetes mellitus with other specified complication: Secondary | ICD-10-CM

## 2018-01-21 DIAGNOSIS — I25119 Atherosclerotic heart disease of native coronary artery with unspecified angina pectoris: Secondary | ICD-10-CM

## 2018-01-21 DIAGNOSIS — I25110 Atherosclerotic heart disease of native coronary artery with unstable angina pectoris: Secondary | ICD-10-CM | POA: Diagnosis not present

## 2018-01-21 DIAGNOSIS — R0789 Other chest pain: Secondary | ICD-10-CM | POA: Diagnosis not present

## 2018-01-21 DIAGNOSIS — Z888 Allergy status to other drugs, medicaments and biological substances status: Secondary | ICD-10-CM | POA: Diagnosis not present

## 2018-01-21 DIAGNOSIS — I200 Unstable angina: Secondary | ICD-10-CM | POA: Diagnosis not present

## 2018-01-21 DIAGNOSIS — E1159 Type 2 diabetes mellitus with other circulatory complications: Secondary | ICD-10-CM | POA: Diagnosis not present

## 2018-01-21 DIAGNOSIS — R079 Chest pain, unspecified: Secondary | ICD-10-CM

## 2018-01-21 DIAGNOSIS — G250 Essential tremor: Secondary | ICD-10-CM | POA: Diagnosis not present

## 2018-01-21 DIAGNOSIS — R0902 Hypoxemia: Secondary | ICD-10-CM | POA: Diagnosis not present

## 2018-01-21 DIAGNOSIS — Z8043 Family history of malignant neoplasm of testis: Secondary | ICD-10-CM | POA: Diagnosis not present

## 2018-01-21 DIAGNOSIS — Z823 Family history of stroke: Secondary | ICD-10-CM | POA: Diagnosis not present

## 2018-01-21 DIAGNOSIS — F17200 Nicotine dependence, unspecified, uncomplicated: Secondary | ICD-10-CM

## 2018-01-21 DIAGNOSIS — Z833 Family history of diabetes mellitus: Secondary | ICD-10-CM | POA: Diagnosis not present

## 2018-01-21 DIAGNOSIS — F329 Major depressive disorder, single episode, unspecified: Secondary | ICD-10-CM | POA: Diagnosis present

## 2018-01-21 DIAGNOSIS — J441 Chronic obstructive pulmonary disease with (acute) exacerbation: Secondary | ICD-10-CM | POA: Diagnosis not present

## 2018-01-21 DIAGNOSIS — E1165 Type 2 diabetes mellitus with hyperglycemia: Secondary | ICD-10-CM | POA: Diagnosis not present

## 2018-01-21 DIAGNOSIS — I213 ST elevation (STEMI) myocardial infarction of unspecified site: Secondary | ICD-10-CM | POA: Diagnosis not present

## 2018-01-21 DIAGNOSIS — G4733 Obstructive sleep apnea (adult) (pediatric): Secondary | ICD-10-CM | POA: Diagnosis present

## 2018-01-21 DIAGNOSIS — Z7951 Long term (current) use of inhaled steroids: Secondary | ICD-10-CM | POA: Diagnosis not present

## 2018-01-21 DIAGNOSIS — Z7982 Long term (current) use of aspirin: Secondary | ICD-10-CM | POA: Diagnosis not present

## 2018-01-21 DIAGNOSIS — F17210 Nicotine dependence, cigarettes, uncomplicated: Secondary | ICD-10-CM | POA: Diagnosis present

## 2018-01-21 DIAGNOSIS — Z7984 Long term (current) use of oral hypoglycemic drugs: Secondary | ICD-10-CM | POA: Diagnosis not present

## 2018-01-21 DIAGNOSIS — Z818 Family history of other mental and behavioral disorders: Secondary | ICD-10-CM | POA: Diagnosis not present

## 2018-01-21 DIAGNOSIS — I214 Non-ST elevation (NSTEMI) myocardial infarction: Secondary | ICD-10-CM | POA: Diagnosis not present

## 2018-01-21 DIAGNOSIS — Z885 Allergy status to narcotic agent status: Secondary | ICD-10-CM | POA: Diagnosis not present

## 2018-01-21 HISTORY — PX: LEFT HEART CATH AND CORONARY ANGIOGRAPHY: CATH118249

## 2018-01-21 HISTORY — PX: CORONARY/GRAFT ACUTE MI REVASCULARIZATION: CATH118305

## 2018-01-21 HISTORY — DX: Atherosclerotic heart disease of native coronary artery with unspecified angina pectoris: I25.119

## 2018-01-21 HISTORY — PX: CORONARY STENT INTERVENTION: CATH118234

## 2018-01-21 HISTORY — PX: TRANSTHORACIC ECHOCARDIOGRAM: SHX275

## 2018-01-21 LAB — CBC WITH DIFFERENTIAL/PLATELET
BASOS ABS: 0 10*3/uL (ref 0.0–0.1)
BASOS PCT: 0 %
EOS PCT: 0 %
Eosinophils Absolute: 0 10*3/uL (ref 0.0–0.7)
HCT: 47.6 % (ref 39.0–52.0)
Hemoglobin: 16.4 g/dL (ref 13.0–17.0)
LYMPHS PCT: 6 %
Lymphs Abs: 0.8 10*3/uL (ref 0.7–4.0)
MCH: 32.7 pg (ref 26.0–34.0)
MCHC: 34.5 g/dL (ref 30.0–36.0)
MCV: 94.8 fL (ref 78.0–100.0)
Monocytes Absolute: 0.4 10*3/uL (ref 0.1–1.0)
Monocytes Relative: 3 %
Neutro Abs: 12.9 10*3/uL — ABNORMAL HIGH (ref 1.7–7.7)
Neutrophils Relative %: 91 %
Platelets: 207 10*3/uL (ref 150–400)
RBC: 5.02 MIL/uL (ref 4.22–5.81)
RDW: 13.1 % (ref 11.5–15.5)
WBC: 14.1 10*3/uL — AB (ref 4.0–10.5)

## 2018-01-21 LAB — COMPREHENSIVE METABOLIC PANEL
ALBUMIN: 3.9 g/dL (ref 3.5–5.0)
ALT: 17 U/L (ref 0–44)
AST: 13 U/L — AB (ref 15–41)
Alkaline Phosphatase: 49 U/L (ref 38–126)
Anion gap: 11 (ref 5–15)
BUN: 14 mg/dL (ref 8–23)
CO2: 24 mmol/L (ref 22–32)
Calcium: 9.2 mg/dL (ref 8.9–10.3)
Chloride: 100 mmol/L (ref 98–111)
Creatinine, Ser: 0.72 mg/dL (ref 0.61–1.24)
GFR calc Af Amer: >60 mL/min (ref >60)
GFR calc non Af Amer: >60 mL/min (ref >60)
GLUCOSE: 280 mg/dL — AB (ref 70–99)
Potassium: 4.6 mmol/L (ref 3.5–5.1)
SODIUM: 135 mmol/L (ref 135–145)
Total Bilirubin: 0.8 mg/dL (ref 0.3–1.2)
Total Protein: 7.4 g/dL (ref 6.5–8.1)

## 2018-01-21 LAB — GLUCOSE, CAPILLARY
GLUCOSE-CAPILLARY: 181 mg/dL — AB (ref 70–99)
Glucose-Capillary: 233 mg/dL — ABNORMAL HIGH (ref 70–99)
Glucose-Capillary: 229 mg/dL — ABNORMAL HIGH (ref 70–99)

## 2018-01-21 LAB — CBC
HEMATOCRIT: 49.6 % (ref 39.0–52.0)
HEMOGLOBIN: 16.4 g/dL (ref 13.0–17.0)
MCH: 31.1 pg (ref 26.0–34.0)
MCHC: 33.1 g/dL (ref 30.0–36.0)
MCV: 94.1 fL (ref 78.0–100.0)
Platelets: 203 10*3/uL (ref 150–400)
RBC: 5.27 MIL/uL (ref 4.22–5.81)
RDW: 12.9 % (ref 11.5–15.5)
WBC: 15.4 10*3/uL — ABNORMAL HIGH (ref 4.0–10.5)

## 2018-01-21 LAB — ECHOCARDIOGRAM COMPLETE
Height: 74.25 in
WEIGHTICAEL: 3312 [oz_av]

## 2018-01-21 LAB — APTT: aPTT: 48 seconds — ABNORMAL HIGH (ref 24–36)

## 2018-01-21 LAB — CREATININE, SERUM
Creatinine, Ser: 0.73 mg/dL (ref 0.61–1.24)
GFR calc Af Amer: >60 mL/min (ref >60)

## 2018-01-21 LAB — TROPONIN I
TROPONIN I: 0.05 ng/mL — AB (ref <0.03)
TROPONIN I: 0.09 ng/mL — AB (ref <0.03)
Troponin I: 0.06 ng/mL (ref <0.03)

## 2018-01-21 LAB — PROTIME-INR
INR: 1
Prothrombin Time: 13.1 seconds (ref 11.4–15.2)

## 2018-01-21 LAB — MRSA PCR SCREENING: MRSA BY PCR: NEGATIVE

## 2018-01-21 SURGERY — CORONARY/GRAFT ACUTE MI REVASCULARIZATION
Anesthesia: LOCAL

## 2018-01-21 MED ORDER — LIDOCAINE HCL (PF) 1 % IJ SOLN
INTRAMUSCULAR | Status: DC | PRN
  Administered 2018-01-21: 2 mL via INTRADERMAL

## 2018-01-21 MED ORDER — HEPARIN SODIUM (PORCINE) 5000 UNIT/ML IJ SOLN
5000.0000 [IU] | Freq: Three times a day (TID) | INTRAMUSCULAR | Status: DC
  Administered 2018-01-21 – 2018-01-24 (×10): 5000 [IU] via SUBCUTANEOUS
  Filled 2018-01-21 (×10): qty 1

## 2018-01-21 MED ORDER — TIROFIBAN HCL IV 12.5 MG/250 ML: 0.1500 ug/kg/min | INTRAVENOUS | Status: AC

## 2018-01-21 MED ORDER — TIROFIBAN HCL IV 12.5 MG/250 ML
INTRAVENOUS | Status: AC
  Filled 2018-01-21: qty 250

## 2018-01-21 MED ORDER — IPRATROPIUM-ALBUTEROL 0.5-2.5 (3) MG/3ML IN SOLN
3.0000 mL | Freq: Three times a day (TID) | RESPIRATORY_TRACT | Status: DC
  Administered 2018-01-21 (×2): 3 mL via RESPIRATORY_TRACT
  Filled 2018-01-21 (×3): qty 3

## 2018-01-21 MED ORDER — HEPARIN (PORCINE) IN NACL 1000-0.9 UT/500ML-% IV SOLN
INTRAVENOUS | Status: DC | PRN
  Administered 2018-01-21 (×2): 500 mL

## 2018-01-21 MED ORDER — MIDAZOLAM HCL 2 MG/2ML IJ SOLN
INTRAMUSCULAR | Status: AC
  Filled 2018-01-21: qty 2

## 2018-01-21 MED ORDER — FENTANYL CITRATE (PF) 100 MCG/2ML IJ SOLN
INTRAMUSCULAR | Status: AC
  Filled 2018-01-21: qty 2

## 2018-01-21 MED ORDER — PRAVASTATIN SODIUM 40 MG PO TABS
80.0000 mg | ORAL_TABLET | Freq: Every day | ORAL | Status: DC
  Administered 2018-01-21: 80 mg via ORAL
  Filled 2018-01-21: qty 2

## 2018-01-21 MED ORDER — ATORVASTATIN CALCIUM 80 MG PO TABS
80.0000 mg | ORAL_TABLET | Freq: Every day | ORAL | Status: DC
  Administered 2018-01-21 – 2018-01-24 (×4): 80 mg via ORAL
  Filled 2018-01-21 (×4): qty 1

## 2018-01-21 MED ORDER — HEPARIN (PORCINE) IN NACL 100-0.45 UNIT/ML-% IJ SOLN
1200.0000 [IU]/h | INTRAMUSCULAR | Status: DC
  Administered 2018-01-21: 1200 [IU]/h via INTRAVENOUS
  Filled 2018-01-21: qty 250

## 2018-01-21 MED ORDER — LISINOPRIL 10 MG PO TABS
10.0000 mg | ORAL_TABLET | Freq: Every day | ORAL | Status: DC
  Administered 2018-01-22 – 2018-01-25 (×4): 10 mg via ORAL
  Filled 2018-01-21 (×4): qty 1

## 2018-01-21 MED ORDER — HEPARIN SODIUM (PORCINE) 1000 UNIT/ML IJ SOLN
INTRAMUSCULAR | Status: AC
  Filled 2018-01-21: qty 1

## 2018-01-21 MED ORDER — MIDAZOLAM HCL 2 MG/2ML IJ SOLN
INTRAMUSCULAR | Status: DC | PRN
  Administered 2018-01-21: 1 mg via INTRAVENOUS

## 2018-01-21 MED ORDER — NAPROXEN SODIUM 275 MG PO TABS
275.0000 mg | ORAL_TABLET | Freq: Two times a day (BID) | ORAL | Status: DC | PRN
  Filled 2018-01-21: qty 2

## 2018-01-21 MED ORDER — HEPARIN (PORCINE) IN NACL 1000-0.9 UT/500ML-% IV SOLN
INTRAVENOUS | Status: AC
  Filled 2018-01-21: qty 1000

## 2018-01-21 MED ORDER — VERAPAMIL HCL 2.5 MG/ML IV SOLN
INTRAVENOUS | Status: AC
  Filled 2018-01-21: qty 2

## 2018-01-21 MED ORDER — IPRATROPIUM-ALBUTEROL 0.5-2.5 (3) MG/3ML IN SOLN
3.0000 mL | RESPIRATORY_TRACT | Status: DC | PRN
  Administered 2018-01-21: 3 mL via RESPIRATORY_TRACT

## 2018-01-21 MED ORDER — FUROSEMIDE 20 MG PO TABS: 20.0000 mg | ORAL_TABLET | Freq: Every day | ORAL | Status: DC | PRN

## 2018-01-21 MED ORDER — TIROFIBAN HCL IV 12.5 MG/250 ML
INTRAVENOUS | Status: AC | PRN
  Administered 2018-01-21: 0.15 ug/kg/min via INTRAVENOUS

## 2018-01-21 MED ORDER — ASPIRIN EC 81 MG PO TBEC
81.0000 mg | DELAYED_RELEASE_TABLET | Freq: Every day | ORAL | Status: DC
  Administered 2018-01-21 – 2018-01-25 (×5): 81 mg via ORAL
  Filled 2018-01-21 (×5): qty 1

## 2018-01-21 MED ORDER — SODIUM CHLORIDE 0.9% FLUSH: 3.0000 mL | INTRAVENOUS | Status: DC | PRN

## 2018-01-21 MED ORDER — METHYLPREDNISOLONE SODIUM SUCC 40 MG IJ SOLR
40.0000 mg | Freq: Four times a day (QID) | INTRAMUSCULAR | Status: AC
  Administered 2018-01-21 (×3): 40 mg via INTRAVENOUS
  Filled 2018-01-21 (×3): qty 1

## 2018-01-21 MED ORDER — FENTANYL CITRATE (PF) 100 MCG/2ML IJ SOLN
INTRAMUSCULAR | Status: DC | PRN
  Administered 2018-01-21: 50 ug via INTRAVENOUS
  Administered 2018-01-21: 25 ug via INTRAVENOUS

## 2018-01-21 MED ORDER — SODIUM CHLORIDE 0.9% FLUSH
3.0000 mL | Freq: Two times a day (BID) | INTRAVENOUS | Status: DC
  Administered 2018-01-21 – 2018-01-24 (×7): 3 mL via INTRAVENOUS

## 2018-01-21 MED ORDER — NITROGLYCERIN 0.4 MG SL SUBL
0.4000 mg | SUBLINGUAL_TABLET | SUBLINGUAL | Status: DC | PRN
  Administered 2018-01-21 (×2): 0.4 mg via SUBLINGUAL
  Filled 2018-01-21: qty 1

## 2018-01-21 MED ORDER — LIDOCAINE HCL (PF) 1 % IJ SOLN
INTRAMUSCULAR | Status: AC
  Filled 2018-01-21: qty 30

## 2018-01-21 MED ORDER — EPINEPHRINE PF 1 MG/10ML IJ SOSY
PREFILLED_SYRINGE | INTRAMUSCULAR | Status: AC
  Filled 2018-01-21: qty 10

## 2018-01-21 MED ORDER — INSULIN ASPART 100 UNIT/ML ~~LOC~~ SOLN
0.0000 [IU] | Freq: Three times a day (TID) | SUBCUTANEOUS | Status: DC
  Administered 2018-01-21 (×2): 5 [IU] via SUBCUTANEOUS
  Administered 2018-01-21: 3 [IU] via SUBCUTANEOUS
  Administered 2018-01-22: 5 [IU] via SUBCUTANEOUS
  Administered 2018-01-22 – 2018-01-23 (×3): 3 [IU] via SUBCUTANEOUS
  Administered 2018-01-23 – 2018-01-24 (×3): 5 [IU] via SUBCUTANEOUS
  Administered 2018-01-24 (×2): 3 [IU] via SUBCUTANEOUS
  Administered 2018-01-25: 5 [IU] via SUBCUTANEOUS
  Administered 2018-01-25: 3 [IU] via SUBCUTANEOUS

## 2018-01-21 MED ORDER — HYDROMORPHONE HCL 1 MG/ML IJ SOLN
1.0000 mg | INTRAMUSCULAR | Status: DC | PRN
  Administered 2018-01-21 – 2018-01-22 (×2): 1 mg via INTRAVENOUS
  Filled 2018-01-21 (×2): qty 1

## 2018-01-21 MED ORDER — TIROFIBAN (AGGRASTAT) BOLUS VIA INFUSION
INTRAVENOUS | Status: DC | PRN
  Administered 2018-01-21: 2347.5 ug via INTRAVENOUS

## 2018-01-21 MED ORDER — NITROGLYCERIN IN D5W 200-5 MCG/ML-% IV SOLN
0.0000 ug/min | INTRAVENOUS | Status: DC
  Administered 2018-01-21: 5 ug/min via INTRAVENOUS

## 2018-01-21 MED ORDER — NAPROXEN 250 MG PO TABS
250.0000 mg | ORAL_TABLET | Freq: Two times a day (BID) | ORAL | Status: DC | PRN
  Administered 2018-01-22: 500 mg via ORAL
  Filled 2018-01-21 (×2): qty 2

## 2018-01-21 MED ORDER — NITROGLYCERIN 1 MG/10 ML FOR IR/CATH LAB
INTRA_ARTERIAL | Status: AC
  Filled 2018-01-21: qty 10

## 2018-01-21 MED ORDER — NITROGLYCERIN IN D5W 200-5 MCG/ML-% IV SOLN
INTRAVENOUS | Status: AC
  Filled 2018-01-21: qty 250

## 2018-01-21 MED ORDER — TICAGRELOR 90 MG PO TABS
ORAL_TABLET | ORAL | Status: AC
  Filled 2018-01-21: qty 2

## 2018-01-21 MED ORDER — SODIUM CHLORIDE 0.9 % IV SOLN
INTRAVENOUS | Status: AC
  Administered 2018-01-21: 100 mL/h via INTRAVENOUS

## 2018-01-21 MED ORDER — IOHEXOL 350 MG/ML SOLN
INTRAVENOUS | Status: DC | PRN
  Administered 2018-01-21: 195 mL via INTRA_ARTERIAL

## 2018-01-21 MED ORDER — HEPARIN BOLUS VIA INFUSION
4000.0000 [IU] | Freq: Once | INTRAVENOUS | Status: AC
  Administered 2018-01-21: 4000 [IU] via INTRAVENOUS
  Filled 2018-01-21: qty 4000

## 2018-01-21 MED ORDER — SODIUM CHLORIDE 0.9 % IV SOLN: 250.0000 mL | INTRAVENOUS | Status: DC | PRN

## 2018-01-21 MED ORDER — PANTOPRAZOLE SODIUM 40 MG PO TBEC
40.0000 mg | DELAYED_RELEASE_TABLET | Freq: Every day | ORAL | Status: DC
  Administered 2018-01-21 – 2018-01-25 (×5): 40 mg via ORAL
  Filled 2018-01-21 (×5): qty 1

## 2018-01-21 MED ORDER — LINAGLIPTIN 5 MG PO TABS
5.0000 mg | ORAL_TABLET | Freq: Every day | ORAL | Status: DC
  Administered 2018-01-22 – 2018-01-25 (×4): 5 mg via ORAL
  Filled 2018-01-21 (×5): qty 1

## 2018-01-21 MED ORDER — VERAPAMIL HCL 2.5 MG/ML IV SOLN
INTRAVENOUS | Status: DC | PRN
  Administered 2018-01-21: 10 mL via INTRA_ARTERIAL

## 2018-01-21 MED ORDER — MOMETASONE FURO-FORMOTEROL FUM 100-5 MCG/ACT IN AERO
2.0000 | INHALATION_SPRAY | Freq: Two times a day (BID) | RESPIRATORY_TRACT | Status: DC
  Administered 2018-01-22 – 2018-01-25 (×7): 2 via RESPIRATORY_TRACT
  Filled 2018-01-21 (×3): qty 8.8

## 2018-01-21 MED ORDER — PRIMIDONE 50 MG PO TABS
50.0000 mg | ORAL_TABLET | Freq: Four times a day (QID) | ORAL | Status: DC
  Administered 2018-01-21 – 2018-01-25 (×16): 50 mg via ORAL
  Filled 2018-01-21 (×20): qty 1

## 2018-01-21 MED ORDER — CANAGLIFLOZIN 100 MG PO TABS
100.0000 mg | ORAL_TABLET | Freq: Every day | ORAL | Status: DC
  Filled 2018-01-21 (×3): qty 1

## 2018-01-21 MED ORDER — TICAGRELOR 90 MG PO TABS
90.0000 mg | ORAL_TABLET | Freq: Two times a day (BID) | ORAL | Status: DC
  Administered 2018-01-21 – 2018-01-25 (×8): 90 mg via ORAL
  Filled 2018-01-21 (×8): qty 1

## 2018-01-21 MED ORDER — ORAL CARE MOUTH RINSE
15.0000 mL | Freq: Two times a day (BID) | OROMUCOSAL | Status: DC
  Administered 2018-01-21 – 2018-01-22 (×2): 15 mL via OROMUCOSAL

## 2018-01-21 MED ORDER — LABETALOL HCL 5 MG/ML IV SOLN: 10.0000 mg | INTRAVENOUS | Status: AC | PRN

## 2018-01-21 MED ORDER — METOPROLOL TARTRATE 5 MG/5ML IV SOLN
5.0000 mg | Freq: Once | INTRAVENOUS | Status: AC
  Administered 2018-01-21: 5 mg via INTRAVENOUS
  Filled 2018-01-21: qty 5

## 2018-01-21 MED ORDER — NICOTINE 21 MG/24HR TD PT24: 21.0000 mg | MEDICATED_PATCH | Freq: Every day | TRANSDERMAL | Status: DC | PRN

## 2018-01-21 MED ORDER — TICAGRELOR 90 MG PO TABS
ORAL_TABLET | ORAL | Status: DC | PRN
  Administered 2018-01-21: 180 mg via ORAL

## 2018-01-21 MED ORDER — HYDRALAZINE HCL 20 MG/ML IJ SOLN: 5.0000 mg | INTRAMUSCULAR | Status: AC | PRN

## 2018-01-21 MED ORDER — HEPARIN SODIUM (PORCINE) 1000 UNIT/ML IJ SOLN
INTRAMUSCULAR | Status: DC | PRN
  Administered 2018-01-21: 8000 [IU] via INTRAVENOUS
  Administered 2018-01-21: 2000 [IU] via INTRAVENOUS
  Administered 2018-01-21: 3000 [IU] via INTRAVENOUS

## 2018-01-21 MED ORDER — METFORMIN HCL 500 MG PO TABS
1000.0000 mg | ORAL_TABLET | Freq: Two times a day (BID) | ORAL | Status: DC
  Administered 2018-01-21: 1000 mg via ORAL
  Filled 2018-01-21: qty 2

## 2018-01-21 MED ORDER — HEPARIN (PORCINE) IN NACL 100-0.45 UNIT/ML-% IJ SOLN
INTRAMUSCULAR | Status: AC
  Administered 2018-01-21: 1200 [IU]/h via INTRAVENOUS
  Filled 2018-01-21: qty 250

## 2018-01-21 MED ORDER — BUPROPION HCL ER (SR) 150 MG PO TB12
150.0000 mg | ORAL_TABLET | Freq: Two times a day (BID) | ORAL | Status: DC
  Administered 2018-01-21 – 2018-01-25 (×9): 150 mg via ORAL
  Filled 2018-01-21 (×9): qty 1

## 2018-01-21 SURGICAL SUPPLY — 18 items
BALLN SAPPHIRE 2.0X15 (BALLOONS) ×2
BALLOON SAPPHIRE 2.0X15 (BALLOONS) ×1 IMPLANT
CATH INFINITI 5FR ANG PIGTAIL (CATHETERS) ×2 IMPLANT
CATH LAUNCHER 6FR JR4 (CATHETERS) ×2 IMPLANT
CATH OPTITORQUE TIG 4.0 5F (CATHETERS) ×2 IMPLANT
CATH VISTA GUIDE 6FR XB3.5 (CATHETERS) ×2 IMPLANT
DEVICE RAD COMP TR BAND LRG (VASCULAR PRODUCTS) ×2 IMPLANT
GLIDESHEATH SLEND SS 6F .021 (SHEATH) ×2 IMPLANT
GUIDEWIRE INQWIRE 1.5J.035X260 (WIRE) ×1 IMPLANT
INQWIRE 1.5J .035X260CM (WIRE) ×2
KIT ENCORE 26 ADVANTAGE (KITS) ×2 IMPLANT
KIT HEART LEFT (KITS) ×2 IMPLANT
PACK CARDIAC CATHETERIZATION (CUSTOM PROCEDURE TRAY) ×2 IMPLANT
STENT SYNERGY DES 2.25X12 (Permanent Stent) ×2 IMPLANT
TRANSDUCER W/STOPCOCK (MISCELLANEOUS) ×2 IMPLANT
TUBING CIL FLEX 10 FLL-RA (TUBING) ×2 IMPLANT
WIRE ASAHI PROWATER 180CM (WIRE) ×2 IMPLANT
WIRE FIGHTER CROSSING 190CM (WIRE) ×2 IMPLANT

## 2018-01-21 NOTE — ED Provider Notes (Signed)
[facility] EMERGENCY DEPARTMENT Provider Note   CSN: [id number] Arrival date & time: 01/20/18  1722     History   Chief Complaint Chief Complaint  Patient presents with  . Code STEMI    HPI Andrew [name] is a 67 y.o. male.  67 yo M with a cc with a chest pain.  Patient was admitted to the hospital last night and had continuing ongoing chest pain throughout the evening.  EKG this morning was concerning for possible STEMI and so he was transferred here for evaluation.  Patient with continued chest pain described as pressure.  Continues to look very uncomfortable.  The history is provided by the patient.  Chest Pain   This is a new problem. The current episode started 2 days ago. The problem occurs constantly. The problem has been gradually worsening. The pain is present in the substernal region. The pain is at a severity of 9/10. The pain is moderate. The quality of the pain is described as brief. The pain does not radiate. Duration of episode(s) is 2 days. Associated symptoms include shortness of breath. Pertinent negatives include no abdominal pain, no fever, no headaches, no palpitations and no vomiting.    Past Medical History:  Diagnosis Date  . Allergy   . Anemia   . Arthritis   . Cancer [facility])    Bladder  . Depression   . Diabetes mellitus without complication ([place])   . GERD (gastroesophageal reflux disease)   . Hypercholesteremia   . Hyperlipidemia   . Hypertension    in the past no longer on medication  . Neuromuscular disorder ([place])   . Sleep apnea    BiPAP no used in 3 years  . Status post tendon repair 1989  . Stroke ([place])    At times pt has dizziness with loss of vision  . Tremors of nervous system 2011    Patient Active Problem List   Diagnosis Date Noted  . COPD with acute exacerbation ([place]) 01/21/2018  . Chest pain 01/20/2018  . Depression 01/20/2018  . Essential hypertension 01/20/2018  . Elevated troponin 01/20/2018  . Elevated  d-dimer 01/20/2018  . Sleep apnea 10/06/2016  . Back spasm 01/15/2016  . Generalized edema 01/15/2016  . Tobacco use disorder 12/19/2015  . Type 2 diabetes mellitus ([place]) 12/19/2015  . Hyperlipidemia associated with type 2 diabetes mellitus ([place]) 12/19/2015  . Bladder cancer ([place]) 12/19/2015  . Esophageal reflux 12/19/2015  . Essential tremor 12/19/2015  . History of stroke 12/19/2015    Past Surgical History:  Procedure Laterality Date  . APPENDECTOMY    . BLADDER SURGERY    . COLONOSCOPY    . CYSTOSCOPY W/ RETROGRADES Bilateral 08/22/2015   Procedure: CYSTOSCOPY WITH RETROGRADE PYELOGRAM;  Surgeon: Irine Seal, MD;  Location: [facility];  Service: Urology;  Laterality: Bilateral;  . CYSTOSCOPY W/ RETROGRADES Bilateral 01/16/2016   Procedure: CYSTOSCOPY WITH RETROGRADE PYELOGRAM;  Surgeon: Irine Seal, MD;  Location: [facility];  Service: Urology;  Laterality: Bilateral;  . CYSTOSCOPY W/ RETROGRADES Bilateral 01/07/2017   Procedure: CYSTOSCOPY WITH RETROGRADE PYELOGRAM;  Surgeon: Irine Seal, MD;  Location: [facility];  Service: Urology;  Laterality: Bilateral;  . CYSTOSCOPY WITH BIOPSY N/A 08/22/2015   Procedure: CYSTOSCOPY WITH BLADDER BIOPSY;  Surgeon: Irine Seal, MD;  Location: [facility];  Service: Urology;  Laterality: N/A;  . CYSTOSCOPY WITH BIOPSY N/A 01/16/2016   Procedure: CYSTOSCOPY WITH BLADDER BIOPSY;  Surgeon: Irine Seal, MD;  Location: [facility];  Service: Urology;  Laterality: N/A;  . CYSTOSCOPY WITH FULGERATION N/A 01/16/2016   Procedure: CYSTOSCOPY WITH FULGERATION;  Surgeon: Irine Seal, MD;  Location: [facility];  Service: Urology;  Laterality: N/A;  . KNEE ARTHROSCOPY Right 1989  . ROTATOR CUFF REPAIR Bilateral 2000  . rt. leg fracture surgery    . TRANSURETHRAL RESECTION OF BLADDER TUMOR N/A 01/07/2017   Procedure: TRANSURETHRAL RESECTION OF BLADDER TUMOR (TURBT);  Surgeon: Irine Seal, MD;  Location: [facility];  Service: Urology;  Laterality: N/A;  . WISDOM TOOTH EXTRACTION          Home  Medications    Prior to Admission medications   Medication Sig Start Date End Date Taking? Authorizing Provider  aspirin 81 MG tablet Take 81 mg by mouth daily.   Yes [provider]  budesonide-formoterol (SYMBICORT) 80-4.5 MCG/ACT inhaler Inhale 2 puffs into the lungs 2 (two) times daily. 01/13/18  Yes Eustaquio Maize, MD  buPROPion [facility] SR) 150 MG 12 hr tablet Take 1 tablet (150 mg total) by mouth 2 (two) times daily. 01/06/18  Yes Eustaquio Maize, MD  dapagliflozin propanediol (FARXIGA) 10 MG TABS tablet Take 10 mg by mouth daily. 01/06/18  Yes Eustaquio Maize, MD  furosemide (LASIX) 20 MG tablet Take 1 tablet (20 mg total) by mouth daily as needed. 08/11/17  Yes Eustaquio Maize, MD  glimepiride (AMARYL) 4 MG tablet Take 4 mg by mouth 2 (two) times daily.   Yes [provider]  lisinopril (PRINIVIL,ZESTRIL) 10 MG tablet Take 1 tablet (10 mg total) by mouth daily. 01/06/18  Yes Eustaquio Maize, MD  metFORMIN (GLUCOPHAGE) 1000 MG tablet Take 1 tablet (1,000 mg total) by mouth 2 (two) times daily with a meal. 07/07/17  Yes Eustaquio Maize, MD  naproxen sodium (ANAPROX) 220 MG tablet Take 440-660 mg by mouth 2 (two) times daily as needed (pain).   Yes [provider]  omeprazole (PRILOSEC) 20 MG capsule TAKE (1) CAPSULE DAILY 11/04/17  Yes Eustaquio Maize, MD  pravastatin (PRAVACHOL) 80 MG tablet Take 1 tablet (80 mg total) by mouth daily. 10/06/17  Yes Eustaquio Maize, MD  primidone (MYSOLINE) 50 MG tablet Take 1 tablet (50 mg total) by mouth 4 (four) times daily. 01/06/18  Yes Eustaquio Maize, MD  sitaGLIPtin (JANUVIA) 100 MG tablet Take 1 tablet (100 mg total) by mouth daily. 01/06/18  Yes Eustaquio Maize, MD    Family History Family History  Problem Relation Age of Onset  . Diabetes Mother 39       type 1  . Stroke Mother   . Dementia Father   . Diabetes Brother   . Heart attack Brother 52  . Testicular cancer Brother   . Diabetes Brother   . Cancer  Brother        testicular  . Colon cancer Neg Hx   . Colon polyps Neg Hx   . Esophageal cancer Neg Hx   . Rectal cancer Neg Hx   . Stomach cancer Neg Hx     Social History Social History   Tobacco Use  . Smoking status: Current Every Day Smoker    Packs/day: 1.00    Years: 42.00    Pack years: 42.00    Types: Cigarettes  . Smokeless tobacco: Former Network engineer Use Topics  . Alcohol use: No    Comment: rarely  . Drug use: No     Allergies   Tramadol and Trulicity [dulaglutide]   Review of  Systems Review of Systems  Constitutional: Negative for chills and fever.  HENT: Negative for congestion and facial swelling.   Eyes: Negative for discharge and visual disturbance.  Respiratory: Positive for shortness of breath.   Cardiovascular: Positive for chest pain. Negative for palpitations.  Gastrointestinal: Negative for abdominal pain, diarrhea and vomiting.  Musculoskeletal: Negative for arthralgias and myalgias.  Skin: Negative for color change and rash.  Neurological: Negative for tremors, syncope and headaches.  Psychiatric/Behavioral: Negative for confusion and dysphoric mood.     Physical Exam Updated Vital Signs BP 116/79 (BP Location: Left Arm)   Pulse 89   Temp 98 F (36.7 C) (Oral)   Resp 18   Ht 6' 2.25" (1.886 m)   Wt 93.9 kg   SpO2 95%   BMI 26.40 kg/m   Physical Exam  Constitutional: He is oriented to person, place, and time. He appears well-developed and well-nourished.  pale  HENT:  Head: Normocephalic and atraumatic.  Eyes: Pupils are equal, round, and reactive to light. EOM are normal.  Neck: Normal range of motion. Neck supple. No JVD present.  Cardiovascular: Normal rate and regular rhythm. Exam reveals no gallop and no friction rub.  No murmur heard. Pulmonary/Chest: No respiratory distress. He has no wheezes.  Abdominal: He exhibits no distension. There is no rebound and no guarding.  Musculoskeletal: Normal range of motion.    Neurological: He is alert and oriented to person, place, and time.  Skin: No rash noted. No pallor.  Psychiatric: He has a normal mood and affect. His behavior is normal.  Nursing note and vitals reviewed.    ED Treatments / Results  Labs (all labs ordered are listed, but only abnormal results are displayed) Labs Reviewed  BASIC METABOLIC PANEL - Abnormal; Notable for the following components:      Result Value   Glucose, Bld 145 (*)    All other components within normal limits  CBC - Abnormal; Notable for the following components:   WBC 23.0 (*)    Hemoglobin 17.6 (*)    All other components within normal limits  TROPONIN I - Abnormal; Notable for the following components:   Troponin I 0.03 (*)    All other components within normal limits  D-DIMER, QUANTITATIVE (NOT AT [facility]) - Abnormal; Notable for the following components:   D-Dimer, Quant 2.08 (*)    All other components within normal limits  TROPONIN I - Abnormal; Notable for the following components:   Troponin I 0.03 (*)    All other components within normal limits  GLUCOSE, CAPILLARY - Abnormal; Notable for the following components:   Glucose-Capillary 168 (*)    All other components within normal limits  TROPONIN I - Abnormal; Notable for the following components:   Troponin I 0.06 (*)    All other components within normal limits  APTT - Abnormal; Notable for the following components:   aPTT 48 (*)    All other components within normal limits  CBC WITH DIFFERENTIAL/PLATELET - Abnormal; Notable for the following components:   WBC 14.1 (*)    Neutro Abs 12.9 (*)    All other components within normal limits  COMPREHENSIVE METABOLIC PANEL - Abnormal; Notable for the following components:   Glucose, Bld 280 (*)    AST 13 (*)    All other components within normal limits  GLUCOSE, CAPILLARY - Abnormal; Notable for the following components:   Glucose-Capillary 233 (*)    All other components within normal limits   MAGNESIUM  PROTIME-INR  HIV ANTIBODY (ROUTINE TESTING)    EKG None  Radiology Dg Chest 2 View  Result Date: 01/20/2018 CLINICAL DATA:  Mid chest pain beginning today. EXAM: CHEST - 2 VIEW COMPARISON:  03/28/2017 FINDINGS: Normal heart size with mild aortic atherosclerosis. No aneurysm. Chronic bronchitic change of the lungs without alveolar consolidation. Osteoarthritis of the [facility] and glenohumeral joints. Surgical anchors project over the right humeral head. No effusion or pneumothorax. Stable mild degenerative change along the dorsal spine. IMPRESSION: Chronic bronchitic change with increased interstitial lung markings noted bilaterally. Mild aortic atherosclerosis without aneurysm. Electronically Signed   By: Ashley Royalty M.D.   On: 01/20/2018 17:49   Ct Angio Chest/abd/pel For Dissection W And/or W/wo  Result Date: 01/20/2018 CLINICAL DATA:  Per ED notes: Pt reports chest pain that began around 1300 this afternoon while sitting on couch. Pt reports pain is worse with movement and inspiration. Pt belching in triage. NAD noted. Airway patent. EXAM: CT ANGIOGRAPHY CHEST, ABDOMEN AND PELVIS TECHNIQUE: Multidetector CT imaging through the chest, abdomen and pelvis was performed using the standard protocol during bolus administration of intravenous contrast. Multiplanar reconstructed images and MIPs were obtained and reviewed to evaluate the vascular anatomy. CONTRAST:  175mL ISOVUE-370 IOPAMIDOL (ISOVUE-370) INJECTION 76% COMPARISON:  CT of the abdomen and pelvis on 01/14/2017 FINDINGS: CTA CHEST FINDINGS Cardiovascular: Heart size is normal. There is atherosclerotic calcification of the coronary arteries and thoracic aorta. No displaced intimal calcifications. With contrast administration there is no dissection. No pericardial effusion. Normal appearance of the pulmonary arteries accounting for the contrast bolus timing. Mediastinum/Nodes: The visualized portion of the thyroid gland has a normal  appearance. Small mediastinal and hilar lymph nodes measure less than 1.5 centimeters in short axis and are likely reactive. Esophagus is normal in appearance. Lungs/Pleura: Emphysematous changes are identified at the lung apices. There are subpleural reticular changes primarily within the dependent portions of the lungs. No consolidations or pleural effusions. Musculoskeletal: No chest wall abnormality. No acute or significant osseous findings. Review of the MIP images confirms the above findings. CTA ABDOMEN AND PELVIS FINDINGS VASCULAR Aorta: There is mild atherosclerotic calcification of the abdominal aorta. No aneurysm. Celiac: There is atherosclerotic calcification at the origin of the celiac axis, not associated with significant narrowing. SMA: Patent without evidence of aneurysm, dissection, vasculitis or significant stenosis. Renals: Single renal arteries bilaterally are patent. IMA: Patent without evidence of aneurysm, dissection, vasculitis or significant stenosis. Inflow: Patent without evidence of aneurysm, dissection, vasculitis or significant stenosis. Veins: No obvious venous abnormality within the limitations of this arterial phase study. Review of the MIP images confirms the above findings. NON-VASCULAR Hepatobiliary: No focal liver abnormality is seen. No radiopaque gallstones, biliary dilatation, or pericholecystic inflammatory changes. Pancreas: Unremarkable. No pancreatic ductal dilatation or surrounding inflammatory changes. Spleen: Numerous splenic granulomata are present. Adrenals/Urinary Tract: Adrenal glands are unremarkable. Kidneys are normal, without renal calculi, focal lesion, or hydronephrosis. Bladder is unremarkable. Stomach/Bowel: The stomach and small bowel loops are normal in appearance. Appendectomy. Large bowel is normal in appearance. Lymphatic: No significant vascular findings are present. No enlarged abdominal or pelvic lymph nodes. Reproductive: Prostate is unremarkable.  Other: No abdominal wall hernia or abnormality. No abdominopelvic ascites. Review of the MIP images confirms the above findings. IMPRESSION: 1. No evidence for dissection or aneurysm. 2. Aortic atherosclerosis.  (ICD10-I70.0) 3. Coronary atherosclerosis. 4.  Emphysema (ICD10-J43.9). 5. Prior granulomatous disease. 6. Appendectomy.  No bowel obstruction. Electronically Signed   By: Nolon Nations M.D.  On: 01/20/2018 20:01    Procedures Procedures (including critical care time)  Medications Ordered in ED Medications  HYDROmorphone (DILAUDID) injection 1 mg (1 mg Intravenous Given 01/21/18 0944)  sucralfate (CARAFATE) 1 GM/10ML suspension 1 g (1 g Oral Given 01/21/18 0808)  acetaminophen (TYLENOL) tablet 650 mg (has no administration in time range)  ondansetron (ZOFRAN) injection 4 mg (has no administration in time range)  ALPRAZolam (XANAX) tablet 0.25 mg (has no administration in time range)  mometasone-formoterol (DULERA) 100-5 MCG/ACT inhaler 2 puff (2 puffs Inhalation Not Given 01/21/18 0753)  aspirin EC tablet 81 mg (81 mg Oral Given 01/21/18 1029)  buPROPion (WELLBUTRIN SR) 12 hr tablet 150 mg (150 mg Oral Given 01/21/18 1029)  canagliflozin (INVOKANA) tablet 100 mg (has no administration in time range)  furosemide (LASIX) tablet 20 mg (has no administration in time range)  metFORMIN (GLUCOPHAGE) tablet 1,000 mg (1,000 mg Oral Given 01/21/18 0808)  pravastatin (PRAVACHOL) tablet 80 mg (80 mg Oral Given 01/21/18 1029)  pantoprazole (PROTONIX) EC tablet 40 mg (40 mg Oral Given 01/21/18 1029)  linagliptin (TRADJENTA) tablet 5 mg (has no administration in time range)  lisinopril (PRINIVIL,ZESTRIL) tablet 10 mg (10 mg Oral Not Given 01/21/18 1056)  primidone (MYSOLINE) tablet 50 mg (50 mg Oral Given 01/21/18 1029)  ipratropium-albuterol (DUONEB) 0.5-2.5 (3) MG/3ML nebulizer solution 3 mL (3 mLs Nebulization Given 01/21/18 0741)  insulin aspart (novoLOG) injection 0-15 Units (5 Units Subcutaneous  Given 01/21/18 0809)  methylPREDNISolone sodium succinate (SOLU-MEDROL) 40 mg/mL injection 40 mg (40 mg Intravenous Given 01/21/18 0615)  nicotine (NICODERM CQ - dosed in mg/24 hours) patch 21 mg (has no administration in time range)  heparin bolus via infusion 4,000 Units (4,000 Units Intravenous Bolus from Bag 01/21/18 0645)    Followed by  heparin ADULT infusion 100 units/mL (25000 units/244mL sodium chloride 0.45%) (1,200 Units/hr Intravenous New Bag/Given 01/21/18 0639)  naproxen (NAPROSYN) tablet 250-500 mg (has no administration in time range)  nitroGLYCERIN (NITROSTAT) SL tablet 0.4 mg (0.4 mg Sublingual Given 01/21/18 1032)  nitroGLYCERIN 50 mg in dextrose 5 % 250 mL (0.2 mg/mL) infusion (5 mcg/min Intravenous New Bag/Given 01/21/18 1052)  EPINEPHrine (ADRENALIN) 1 MG/10ML injection (has no administration in time range)  alum & mag hydroxide-simeth (MAALOX/MYLANTA) 200-200-20 MG/5ML suspension 30 mL (30 mLs Oral Given 01/20/18 1811)  sodium chloride 0.9 % bolus 1,000 mL (0 mLs Intravenous Stopped 01/20/18 2047)  iopamidol (ISOVUE-370) 76 % injection 100 mL (100 mLs Intravenous Contrast Given 01/20/18 1908)  famotidine (PEPCID) IVPB 20 mg premix (0 mg Intravenous Stopped 01/20/18 2103)  morphine 4 MG/ML injection 4 mg (4 mg Intravenous Given 01/20/18 2047)  aspirin chewable tablet 324 mg (324 mg Oral Given 01/20/18 2102)  ketorolac (TORADOL) 30 MG/ML injection 30 mg (30 mg Intravenous Given 01/20/18 2318)  methylPREDNISolone sodium succinate (SOLU-MEDROL) 125 mg/2 mL injection 125 mg (125 mg Intravenous Given 01/21/18 0000)  magnesium sulfate IVPB 2 g 50 mL (0 g Intravenous Stopped 01/21/18 0428)  metoprolol tartrate (LOPRESSOR) injection 5 mg (5 mg Intravenous Given 01/21/18 0702)  nitroGLYCERIN 0.2 mg/mL in dextrose 5 % infusion (  Duplicate 06/11/12 7829)     Initial Impression / Assessment and Plan / ED Course  I have reviewed the triage vital signs and the nursing notes.  Pertinent labs &  imaging results that were available during my care of the patient were reviewed by me and considered in my medical decision making (see chart for details).     67 yo M with a  chief complaint of chest pain.  Patient was hospitalized at [facility] and had continuing chest pain throughout the evening and an EKG this morning concerning for a STEMI.  He was transferred here.  Had a in route EKG that was also concerning for significant elevation anteriorly.  Dr. Ellyn Hack, cardiology was third evaluated at arrival.  Will take to the Cath Lab.  CRITICAL CARE Performed by: Cecilio Asper   Total critical care time: 35 minutes  Critical care time was exclusive of separately billable procedures and treating other patients.  Critical care was necessary to treat or prevent imminent or life-threatening deterioration.  Critical care was time spent personally by me on the following activities: development of treatment plan with patient and/or surrogate as well as nursing, discussions with consultants, evaluation of patient's response to treatment, examination of patient, obtaining history from patient or surrogate, ordering and performing treatments and interventions, ordering and review of laboratory studies, ordering and review of radiographic studies, pulse oximetry and re-evaluation of patient's condition.  The patients results and plan were reviewed and discussed.   Any x-rays performed were independently reviewed by myself.   Differential diagnosis were considered with the presenting HPI.  Medications  HYDROmorphone (DILAUDID) injection 1 mg (1 mg Intravenous Given 01/21/18 0944)  sucralfate (CARAFATE) 1 GM/10ML suspension 1 g (1 g Oral Given 01/21/18 0808)  acetaminophen (TYLENOL) tablet 650 mg (has no administration in time range)  ondansetron (ZOFRAN) injection 4 mg (has no administration in time range)  ALPRAZolam (XANAX) tablet 0.25 mg (has no administration in time range)    mometasone-formoterol (DULERA) 100-5 MCG/ACT inhaler 2 puff (2 puffs Inhalation Not Given 01/21/18 0753)  aspirin EC tablet 81 mg (81 mg Oral Given 01/21/18 1029)  buPROPion (WELLBUTRIN SR) 12 hr tablet 150 mg (150 mg Oral Given 01/21/18 1029)  canagliflozin (INVOKANA) tablet 100 mg (has no administration in time range)  furosemide (LASIX) tablet 20 mg (has no administration in time range)  metFORMIN (GLUCOPHAGE) tablet 1,000 mg (1,000 mg Oral Given 01/21/18 0808)  pravastatin (PRAVACHOL) tablet 80 mg (80 mg Oral Given 01/21/18 1029)  pantoprazole (PROTONIX) EC tablet 40 mg (40 mg Oral Given 01/21/18 1029)  linagliptin (TRADJENTA) tablet 5 mg (has no administration in time range)  lisinopril (PRINIVIL,ZESTRIL) tablet 10 mg (10 mg Oral Not Given 01/21/18 1056)  primidone (MYSOLINE) tablet 50 mg (50 mg Oral Given 01/21/18 1029)  ipratropium-albuterol (DUONEB) 0.5-2.5 (3) MG/3ML nebulizer solution 3 mL (3 mLs Nebulization Given 01/21/18 0741)  insulin aspart (novoLOG) injection 0-15 Units (5 Units Subcutaneous Given 01/21/18 0809)  methylPREDNISolone sodium succinate (SOLU-MEDROL) 40 mg/mL injection 40 mg (40 mg Intravenous Given 01/21/18 0615)  nicotine (NICODERM CQ - dosed in mg/24 hours) patch 21 mg (has no administration in time range)  heparin bolus via infusion 4,000 Units (4,000 Units Intravenous Bolus from Bag 01/21/18 0645)    Followed by  heparin ADULT infusion 100 units/mL (25000 units/218mL sodium chloride 0.45%) (1,200 Units/hr Intravenous New Bag/Given 01/21/18 0639)  naproxen (NAPROSYN) tablet 250-500 mg (has no administration in time range)  nitroGLYCERIN (NITROSTAT) SL tablet 0.4 mg (0.4 mg Sublingual Given 01/21/18 1032)  nitroGLYCERIN 50 mg in dextrose 5 % 250 mL (0.2 mg/mL) infusion (5 mcg/min Intravenous New Bag/Given 01/21/18 1052)  EPINEPHrine (ADRENALIN) 1 MG/10ML injection (has no administration in time range)  alum & mag hydroxide-simeth (MAALOX/MYLANTA) 200-200-20 MG/5ML suspension  30 mL (30 mLs Oral Given 01/20/18 1811)  sodium chloride 0.9 % bolus 1,000 mL (0 mLs Intravenous Stopped 01/20/18  2047)  iopamidol (ISOVUE-370) 76 % injection 100 mL (100 mLs Intravenous Contrast Given 01/20/18 1908)  famotidine (PEPCID) IVPB 20 mg premix (0 mg Intravenous Stopped 01/20/18 2103)  morphine 4 MG/ML injection 4 mg (4 mg Intravenous Given 01/20/18 2047)  aspirin chewable tablet 324 mg (324 mg Oral Given 01/20/18 2102)  ketorolac (TORADOL) 30 MG/ML injection 30 mg (30 mg Intravenous Given 01/20/18 2318)  methylPREDNISolone sodium succinate (SOLU-MEDROL) 125 mg/2 mL injection 125 mg (125 mg Intravenous Given 01/21/18 0000)  magnesium sulfate IVPB 2 g 50 mL (0 g Intravenous Stopped 01/21/18 0428)  metoprolol tartrate (LOPRESSOR) injection 5 mg (5 mg Intravenous Given 01/21/18 0702)  nitroGLYCERIN 0.2 mg/mL in dextrose 5 % infusion (  Duplicate 1/61/09 6045)    Vitals:   01/21/18 0621 01/21/18 0741 01/21/18 0954 01/21/18 1047  BP: 125/84  133/87 116/79  Pulse: 85  86 89  Resp: 20  18   Temp: 98.2 F (36.8 C)  98.4 F (36.9 C) 98 F (36.7 C)  TempSrc: Oral  Oral Oral  SpO2: 96% 94% 93% 95%  Weight:      Height:        Final diagnoses:  ST elevation myocardial infarction involving left anterior descending (LAD) coronary artery ([place])    Admission/ observation were discussed with the admitting physician, patient and/or family and they are comfortable with the plan.    Final Clinical Impressions(s) / ED Diagnoses   Final diagnoses:  ST elevation myocardial infarction involving left anterior descending (LAD) coronary artery [facility])    ED Discharge Orders    None       Deno Etienne, DO 01/21/18 1206

## 2018-01-21 NOTE — Progress Notes (Signed)
Nitroglycerin drip initiated prior to transport as patient is complaining of uncontrolled chest pain that has not responded well to repeat doses of Dilaudid or SL Nitroglycerin. Continues to have 8/10 chest pain and VSS.

## 2018-01-21 NOTE — Progress Notes (Signed)
Critical elevated troponin, anticipated.  Shella Spearing, RN

## 2018-01-21 NOTE — Progress Notes (Signed)
ANTICOAGULATION CONSULT NOTE - Preliminary  Pharmacy Consult for heparin Indication: chest pain/ACS  Allergies  Allergen Reactions  . Tramadol Shortness Of Breath    And dizziness  . Trulicity [Dulaglutide] Swelling    Patient Measurements: Height: 6' 2.25" (188.6 cm) Weight: 207 lb (93.9 kg) IBW/kg (Calculated) : 82.78 HEPARIN DW (KG): 93.9   Vital Signs: Temp: 98.2 F (36.8 C) (08/31 0621) Temp Source: Oral (08/31 0621) BP: 125/84 (08/31 0621) Pulse Rate: 85 (08/31 0621)  Labs: Recent Labs    01/20/18 1730 01/20/18 2011  HGB 17.6*  --   HCT 50.9  --   PLT 237  --   CREATININE 0.72  --   TROPONINI 0.03* 0.03*   Estimated Creatinine Clearance: 104.9 mL/min (by C-G formula based on SCr of 0.72 mg/dL).  Medical History: Past Medical History:  Diagnosis Date  . Allergy   . Anemia   . Arthritis   . Cancer [facility])    Bladder  . Depression   . Diabetes mellitus without complication ([place])   . GERD (gastroesophageal reflux disease)   . Hypercholesteremia   . Hyperlipidemia   . Hypertension    in the past no longer on medication  . Neuromuscular disorder ([place])   . Sleep apnea    BiPAP no used in 3 years  . Status post tendon repair 1989  . Stroke ([place])    At times pt has dizziness with loss of vision  . Tremors of nervous system 2011    Medications:  Scheduled:  . aspirin EC  81 mg Oral Daily  . buPROPion  150 mg Oral BID  . canagliflozin  100 mg Oral QAC breakfast  . heparin  4,000 Units Intravenous Once  . insulin aspart  0-15 Units Subcutaneous TID WC  . ipratropium-albuterol  3 mL Nebulization TID  . linagliptin  5 mg Oral Daily  . lisinopril  10 mg Oral Daily  . metFORMIN  1,000 mg Oral BID WC  . methylPREDNISolone (SOLU-MEDROL) injection  40 mg Intravenous Q6H  . mometasone-formoterol  2 puff Inhalation BID  . pantoprazole  40 mg Oral Daily  . pravastatin  80 mg Oral Daily  . primidone  50 mg Oral QID  . sucralfate  1 g Oral TID WC & HS    Infusions:  . heparin    . heparin      Assessment: 67 yo male presented with severe, sharp chest pain.  CT was reassuring, no evidence of dissection.  Was admitted for evaluation, now starting heparin for STEMI per RN.   Goal of Therapy:  Heparin level 0.3-0.7 units/ml   Plan:  Give 4000 units bolus x 1 Start heparin infusion at 1200 units/hr Check anti-Xa level in 6 hours and daily while on heparin Continue to monitor H&H and platelets Preliminary review of pertinent patient information completed.  Forestine Na clinical pharmacist will complete review during morning rounds to assess the patient and finalize treatment regimen.  Nyra Capes, RPH 01/21/2018,6:31 AM

## 2018-01-21 NOTE — H&P (Addendum)
Cardiology Admission History and Physical:   Patient ID: Andrew [name]; MRN: [id number]; DOB: 04-Jun-1950   Admission date: 01/20/2018  Primary Care Provider: Eustaquio Maize, MD Primary Cardiologist: No primary care provider on file. NEW (will follow-up in [place]) Primary Electrophysiologist: None  Chief Complaint:  CHEST PAIN & SHORTNESS OF BREATH -- PER EMS ANTEROLATERAL STEMI  Patient Profile:   Andrew [name] is a 67 y.o. male with a history of cardiac risk factors including hypertension, hyperlipidemia, diabetes mellitus, type II (on insulin), long-standing smoker with COPD, OSA no longer on BiPAP with also history of CVA who was admitted to [facility] for prolonged chest pain and shortness of breath. He was admitted as a possible COPD exacerbation.  EKGs have been intermittently concerning for possible ST elevations.  He had mild troponin elevation of 0.06.  With ongoing chest pain and abnormal EKGs, he was being transferred to [facility].  Upon EMS arrival for transport, he again noted worsening chest pain just prior to starting nitroglycerin drip there was up to 8 out of 10.  Repeat EKG more concerning for STEMI - at this point, we decided to change his Dx from NSTEMI (with dynamic EKG changes) to likely Anterior STEMI, and he was transferred for Emergent Cath as Code STEMI.  History of Present Illness:   Andrew [name] was in his normal state of health until yesterday (01/20/2018) when he started having some chest pain beginning about noon.  This was associated with feeling his heart rate going fast and being short of breath.  He had some mild nausea and intermittent dizziness but no vomiting.  He had noted some coughing but no significant amount. -->  Baseline evaluation in the ER chest x-ray suggested emphysema with chronic bronchitic changes and mild interstitial lung markings but no obvious findings of pneumonia.  CTA of the chest did not show any evidence of PE or  dissection.  He was started on treatment for possible COPD exacerbation with Solu-Medrol and bronchodilators. The pain was initially described sounding somewhat pleuritic in nature concerning for pericarditis.  Plan was to rule him out.  He did have a mild troponin elevation and continued to have chest pain throughout the course of the night.  He was then reevaluated at roughly 6 AM in the morning when EKG showed concerning ST elevations - that resolved on repeat EKG.  He was somewhat hypertensive tachycardic and was given 5 mg Lopressor and aspirin.  The admitting physician contacted Dr. Domenic Polite (on-call for cardiology) the plan was to transfer to [facility] for cardiology evaluation. He was started on nitroglycerin drip and was also given Dilaudid with no change in chest pain.  He was not considered a STEMI since the repeat EKG was not diagnostic & pain improved with Lopressor.  However, he did continue to have pain requiring transfer (initially as a NSTEMI).    Upon EMS arrival, he noted worsening pain & a repeat EKG performed (~11:16) showed significant change with more prominent anterior lateral ST elevations concerning for ST Elevation MI.  Therefore code STEMI was called he was transported to [facility] for catheterization. Concern was that he was having dynamic intermittent LAD occlusion - with possible spasm leading.  Thus explaining the prior 2 EKGs with one very concerning (in setting of worse pain) followed by a more normal study with less pain.  Upon arrival to [facility] he was still having chest pain, and I therefore decided to take him to the Cath Lab  for emergent catheterization.  Past Medical History:  Diagnosis Date  . Allergy   . Anemia   . Arthritis   . Cancer [facility])    Bladder  . Depression   . Diabetes mellitus without complication ([place])   . GERD (gastroesophageal reflux disease)   . Hypercholesteremia   . Hyperlipidemia   . Hypertension    in the past no longer on  medication  . Neuromuscular disorder ([place])   . Sleep apnea    BiPAP no used in 3 years  . Status post tendon repair 1989  . Stroke ([place])    At times pt has dizziness with loss of vision  . Tremors of nervous system 2011    Past Surgical History:  Procedure Laterality Date  . APPENDECTOMY    . BLADDER SURGERY    . COLONOSCOPY    . CYSTOSCOPY W/ RETROGRADES Bilateral 08/22/2015   Procedure: CYSTOSCOPY WITH RETROGRADE PYELOGRAM;  Surgeon: Irine Seal, MD;  Location: [facility];  Service: Urology;  Laterality: Bilateral;  . CYSTOSCOPY W/ RETROGRADES Bilateral 01/16/2016   Procedure: CYSTOSCOPY WITH RETROGRADE PYELOGRAM;  Surgeon: Irine Seal, MD;  Location: [facility];  Service: Urology;  Laterality: Bilateral;  . CYSTOSCOPY W/ RETROGRADES Bilateral 01/07/2017   Procedure: CYSTOSCOPY WITH RETROGRADE PYELOGRAM;  Surgeon: Irine Seal, MD;  Location: [facility];  Service: Urology;  Laterality: Bilateral;  . CYSTOSCOPY WITH BIOPSY N/A 08/22/2015   Procedure: CYSTOSCOPY WITH BLADDER BIOPSY;  Surgeon: Irine Seal, MD;  Location: [facility];  Service: Urology;  Laterality: N/A;  . CYSTOSCOPY WITH BIOPSY N/A 01/16/2016   Procedure: CYSTOSCOPY WITH BLADDER BIOPSY;  Surgeon: Irine Seal, MD;  Location: [facility];  Service: Urology;  Laterality: N/A;  . CYSTOSCOPY WITH FULGERATION N/A 01/16/2016   Procedure: CYSTOSCOPY WITH FULGERATION;  Surgeon: Irine Seal, MD;  Location: [facility];  Service: Urology;  Laterality: N/A;  . KNEE ARTHROSCOPY Right 1989  . ROTATOR CUFF REPAIR Bilateral 2000  . rt. leg fracture surgery    . TRANSURETHRAL RESECTION OF BLADDER TUMOR N/A 01/07/2017   Procedure: TRANSURETHRAL RESECTION OF BLADDER TUMOR (TURBT);  Surgeon: Irine Seal, MD;  Location: [facility];  Service: Urology;  Laterality: N/A;  . WISDOM TOOTH EXTRACTION       Medications Prior to Admission: Prior to Admission medications   Medication Sig Start Date End Date Taking? Authorizing Provider  aspirin 81 MG tablet Take 81 mg by mouth daily.    Yes [provider]  budesonide-formoterol (SYMBICORT) 80-4.5 MCG/ACT inhaler Inhale 2 puffs into the lungs 2 (two) times daily. 01/13/18  Yes Eustaquio Maize, MD  buPROPion [facility] SR) 150 MG 12 hr tablet Take 1 tablet (150 mg total) by mouth 2 (two) times daily. 01/06/18  Yes Eustaquio Maize, MD  dapagliflozin propanediol (FARXIGA) 10 MG TABS tablet Take 10 mg by mouth daily. 01/06/18  Yes Eustaquio Maize, MD  furosemide (LASIX) 20 MG tablet Take 1 tablet (20 mg total) by mouth daily as needed. 08/11/17  Yes Eustaquio Maize, MD  glimepiride (AMARYL) 4 MG tablet Take 4 mg by mouth 2 (two) times daily.   Yes [provider]  lisinopril (PRINIVIL,ZESTRIL) 10 MG tablet Take 1 tablet (10 mg total) by mouth daily. 01/06/18  Yes Eustaquio Maize, MD  metFORMIN (GLUCOPHAGE) 1000 MG tablet Take 1 tablet (1,000 mg total) by mouth 2 (two) times daily with a meal. 07/07/17  Yes Eustaquio Maize, MD  naproxen sodium (ANAPROX) 220 MG tablet Take 440-660 mg by mouth  2 (two) times daily as needed (pain).   Yes [provider]  omeprazole (PRILOSEC) 20 MG capsule TAKE (1) CAPSULE DAILY 11/04/17  Yes Eustaquio Maize, MD  pravastatin (PRAVACHOL) 80 MG tablet Take 1 tablet (80 mg total) by mouth daily. 10/06/17  Yes Eustaquio Maize, MD  primidone (MYSOLINE) 50 MG tablet Take 1 tablet (50 mg total) by mouth 4 (four) times daily. 01/06/18  Yes Eustaquio Maize, MD  sitaGLIPtin (JANUVIA) 100 MG tablet Take 1 tablet (100 mg total) by mouth daily. 01/06/18  Yes Eustaquio Maize, MD     Allergies:    Allergies  Allergen Reactions  . Tramadol Shortness Of Breath    And dizziness  . Trulicity [Dulaglutide] Swelling    Social History:   [place] Social History   Tobacco Use  . Smoking status: Current Every Day Smoker    Packs/day: 1.00    Years: 42.00    Pack years: 42.00    Types: Cigarettes  . Smokeless tobacco: Former Network engineer Use Topics  . Alcohol use: No    Comment:  rarely  . Drug use: No   Social History   Social History Narrative  . Not on file     Family History:   The patient's family history includes Cancer in his brother; Dementia in his father; Diabetes in his brother and brother; Diabetes (age of onset: 77) in his mother; Heart attack (age of onset: 61) in his brother; Stroke in his mother; Testicular cancer in his brother. There is no history of Colon cancer, Colon polyps, Esophageal cancer, Rectal cancer, or Stomach cancer.    ROS:  Please see the history of present illness.   Review of Systems - Negative except Symptoms noted below Respiratory ROS: positive for - cough, shortness of breath, wheezing and Nonproductive cough Cardiovascular ROS: positive for - chest pain and shortness of breath Gastrointestinal ROS: positive for - nausea/vomiting and But has not actually vomited Neurological ROS: no TIA or stroke symptoms; He also has resting intermittent tremor that is no different from baseline.  All other ROS reviewed and negative.     Physical Exam/Data:   Vitals:   01/21/18 1311 01/21/18 1316 01/21/18 1321 01/21/18 1326  BP: 113/78 117/78 113/77 123/81  Pulse: 84 94 85 (!) 0  Resp: 14 15 (!) 23 18  Temp:      TempSrc:      SpO2: 96% 97% 96% 91%  Weight:      Height:        Intake/Output Summary (Last 24 hours) at 01/21/2018 1351 Last data filed at 01/21/2018 1341 Gross per 24 hour  Intake 1513.33 ml  Output 1500 ml  Net 13.33 ml   Filed Weights   01/20/18 1717  Weight: 93.9 kg   Body mass index is 26.4 kg/m.  General:  Well nourished, well developed, in Moderate distress with CP HEENT: normal - PERRL, EOMI. MMM Lymph: no adenopathy Neck: no JVD Endocrine:  No thryomegaly Vascular: No carotid bruits; FA pulses 2+ bilaterally without bruits  Cardiac:  normal S1, S2; RRR; no murmur /rub or gallops Lungs:  Non-labored; coarse BS bilaterally, but no obvious rales/rhonchi or wheezing Abd: soft, nontender, no  hepatomegaly  Ext: no edema Musculoskeletal:  No deformities, BUE and BLE strength normal and equal Skin: warm and dry  Neuro:  CNs 2-12 intact, no focal abnormalities noted Psych:  Normal affect    EKG:  The ECG that was done by EMS prior  to transport was personally reviewed and demonstrates sinus rhythm, rate 88 bpm.  There are ST elevations of roughly 2 to 3 mm in V2 through V4 as well as subtle elevation in II --this level of EKG change was more prominent than the most recent EKG while inpatient at any pain.  Based on this change, we decided with ongoing pain to consider this to be an anterolateral ST elevation MI.  Relevant CV Studies: None yet available; echo performed, not yet read  Laboratory Data:  Chemistry Recent Labs  Lab 01/20/18 1730 01/21/18 0856  NA 136 135  K 4.2 4.6  CL 104 100  CO2 22 24  GLUCOSE 145* 280*  BUN 12 14  CREATININE 0.72 0.72  CALCIUM 9.6 9.2  GFRNONAA >60 >60  GFRAA >60 >60  ANIONGAP 10 11    Recent Labs  Lab 01/21/18 0856  PROT 7.4  ALBUMIN 3.9  AST 13*  ALT 17  ALKPHOS 49  BILITOT 0.8   Hematology Recent Labs  Lab 01/20/18 1730 01/21/18 0856  WBC 23.0* 14.1*  RBC 5.40 5.02  HGB 17.6* 16.4  HCT 50.9 47.6  MCV 94.3 94.8  MCH 32.6 32.7  MCHC 34.6 34.5  RDW 13.0 13.1  PLT 237 207   Cardiac Enzymes Recent Labs  Lab 01/20/18 1730 01/20/18 2011 01/21/18 0628  TROPONINI 0.03* 0.03* 0.06*   No results for input(s): TROPIPOC in the last 168 hours.  BNPNo results for input(s): BNP, PROBNP in the last 168 hours.  DDimer  Recent Labs  Lab 01/20/18 1730  DDIMER 2.08*    Radiology/Studies:  Dg Chest 2 View  Result Date: 01/20/2018 CLINICAL DATA:  Mid chest pain beginning today. EXAM: CHEST - 2 VIEW COMPARISON:  03/28/2017 FINDINGS: Normal heart size with mild aortic atherosclerosis. No aneurysm. Chronic bronchitic change of the lungs without alveolar consolidation. Osteoarthritis of the [facility] and glenohumeral joints.  Surgical anchors project over the right humeral head. No effusion or pneumothorax. Stable mild degenerative change along the dorsal spine. IMPRESSION: Chronic bronchitic change with increased interstitial lung markings noted bilaterally. Mild aortic atherosclerosis without aneurysm. Electronically Signed   By: Ashley Royalty M.D.   On: 01/20/2018 17:49   Ct Angio Chest/abd/pel For Dissection W And/or W/wo  Result Date: 01/20/2018 CLINICAL DATA:  Per ED notes: Pt reports chest pain that began around 1300 this afternoon while sitting on couch. Pt reports pain is worse with movement and inspiration. Pt belching in triage. NAD noted. Airway patent. EXAM: CT ANGIOGRAPHY CHEST, ABDOMEN AND PELVIS TECHNIQUE: Multidetector CT imaging through the chest, abdomen and pelvis was performed using the standard protocol during bolus administration of intravenous contrast. Multiplanar reconstructed images and MIPs were obtained and reviewed to evaluate the vascular anatomy. CONTRAST:  167mL ISOVUE-370 IOPAMIDOL (ISOVUE-370) INJECTION 76% COMPARISON:  CT of the abdomen and pelvis on 01/14/2017 FINDINGS: CTA CHEST FINDINGS Cardiovascular: Heart size is normal. There is atherosclerotic calcification of the coronary arteries and thoracic aorta. No displaced intimal calcifications. With contrast administration there is no dissection. No pericardial effusion. Normal appearance of the pulmonary arteries accounting for the contrast bolus timing. Mediastinum/Nodes: The visualized portion of the thyroid gland has a normal appearance. Small mediastinal and hilar lymph nodes measure less than 1.5 centimeters in short axis and are likely reactive. Esophagus is normal in appearance. Lungs/Pleura: Emphysematous changes are identified at the lung apices. There are subpleural reticular changes primarily within the dependent portions of the lungs. No consolidations or pleural effusions. Musculoskeletal: No chest wall  abnormality. No acute or  significant osseous findings. Review of the MIP images confirms the above findings. CTA ABDOMEN AND PELVIS FINDINGS VASCULAR Aorta: There is mild atherosclerotic calcification of the abdominal aorta. No aneurysm. Celiac: There is atherosclerotic calcification at the origin of the celiac axis, not associated with significant narrowing. SMA: Patent without evidence of aneurysm, dissection, vasculitis or significant stenosis. Renals: Single renal arteries bilaterally are patent. IMA: Patent without evidence of aneurysm, dissection, vasculitis or significant stenosis. Inflow: Patent without evidence of aneurysm, dissection, vasculitis or significant stenosis. Veins: No obvious venous abnormality within the limitations of this arterial phase study. Review of the MIP images confirms the above findings. NON-VASCULAR Hepatobiliary: No focal liver abnormality is seen. No radiopaque gallstones, biliary dilatation, or pericholecystic inflammatory changes. Pancreas: Unremarkable. No pancreatic ductal dilatation or surrounding inflammatory changes. Spleen: Numerous splenic granulomata are present. Adrenals/Urinary Tract: Adrenal glands are unremarkable. Kidneys are normal, without renal calculi, focal lesion, or hydronephrosis. Bladder is unremarkable. Stomach/Bowel: The stomach and small bowel loops are normal in appearance. Appendectomy. Large bowel is normal in appearance. Lymphatic: No significant vascular findings are present. No enlarged abdominal or pelvic lymph nodes. Reproductive: Prostate is unremarkable. Other: No abdominal wall hernia or abnormality. No abdominopelvic ascites. Review of the MIP images confirms the above findings. IMPRESSION: 1. No evidence for dissection or aneurysm. 2. Aortic atherosclerosis.  (ICD10-I70.0) 3. Coronary atherosclerosis. 4.  Emphysema (ICD10-J43.9). 5. Prior granulomatous disease. 6. Appendectomy.  No bowel obstruction. Electronically Signed   By: Nolon Nations M.D.   On:  01/20/2018 20:01    Assessment and Plan:   Principal Problem:   Acute ST elevation myocardial infarction (STEMI) of anterolateral wall (HCC) Active Problems:   Elevated troponin   COPD with acute exacerbation (HCC)   Tobacco use disorder   Type 2 diabetes mellitus ([place])   Hyperlipidemia associated with type 2 diabetes mellitus (HCC)   Essential hypertension   Esophageal reflux   Essential tremor   Chest pain   Depression   Elevated d-dimer  Presentation with intermittent episodes of acute ST elevation with ongoing chest pain.  He will be taken directly to cardiac catheterization lab for anatomic evaluation and possible PCI.  His presentation is somewhat concerning as his symptoms are ongoing now for over 12 hours any has minimal troponin elevation, however his EKG changes are concerning that he may have potential circumflex disease.  --  He had elevated d-dimer but his CTA was reassuring. I would continue his COPD medications with scheduled and as needed bronchodilators along with short course of Solu-Medrol and supplemental oxygen.  We will switch him from pravastatin to atorvastatin, but continue his other home meds for diabetes with exception of metformin -sliding scale insulin.  PPI for GI prophylaxis.  Otherwise we will continue with his home medications including primidone for tremor..   Severity of Illness: The appropriate patient status for this patient is INPATIENT. Inpatient status is judged to be reasonable and necessary in order to provide the required intensity of service to ensure the patient's safety. The patient's presenting symptoms, physical exam findings, and initial radiographic and laboratory data in the context of their chronic comorbidities is felt to place them at high risk for further clinical deterioration. Furthermore, it is not anticipated that the patient will be medically stable for discharge from the hospital within 2 midnights of admission. The  following factors support the patient status of inpatient.   " The patient's presenting symptoms include ongoing chest pain and shortness of breath. "  The worrisome physical exam findings include coarse breath sounds with rhonchi. " The initial radiographic and laboratory data are worrisome because of EKG showing possible anterolateral ST elevation MI. " The chronic co-morbidities include type 2 diabetes mellitus, hypertension, hyperlipidemia, chronic smoker, COPD, history of CVA, GERD and tremor..   * I certify that at the point of admission it is my clinical judgment that the patient will require inpatient hospital care spanning beyond 2 midnights from the point of admission due to high intensity of service, high risk for further deterioration and high frequency of surveillance required.*    For questions or updates, please contact [place] Please consult [email or web address] for contact info under Cardiology/STEMI.    Signed, Glenetta Hew, MD  01/21/2018 1:51 PM

## 2018-01-21 NOTE — Progress Notes (Addendum)
PROGRESS NOTE    Andrew [name]  UMP:[id number] DOB: 04-14-1951 DOA: 01/20/2018 PCP: Eustaquio Maize, MD   Brief Narrative:  HPI: Andrew [name] is a 67 y.o. male with medical history significant of seasonal allergies, anemia, osteoarthritis, bladder cancer, depression, type 2 diabetes, GERD, hyperlipidemia, hypertension, sleep apnea not on BiPAP anymore, history of CVA, history of tremors who is coming to the emergency department due to chest pain since late in the morning tachycardia getting progressively worse until around 1300 when it became very intense while he was sitting on his couch associated with mild dyspnea, nausea, but no emesis and intermittent dizziness.  It is central in location, sharp in nature, non-radiated, worsened by deep inspiration and partially relieved by opioid analgesics.  He gets occasional pitting edema of the lower extremities, but denies orthopnea or PND.  He denies headache, fever, sore throat, but complains of wheezing and cough, which is occasionally productive.  No hemoptysis.  Denies abdominal pain, diarrhea, constipation, melena or hematochezia.  Denies dysuria, frequency or hematuria.  No polyuria, polydipsia, polyphagia or blurred vision.  ED Course: Initial vital signs temperature 98.6 F, pulse 104, respirations 18, blood pressure 119/63 mmHg and O2 sat 95% on room air.  The patient received aspirin, morphine, and 1 L NS bolus and nitroglycerin with partial relief of pain.  His white count was 23,000, hemoglobin 17.6 g/dL and platelets 237.  D-dimer was 2.08.  Troponin x2 was 0.03 ng/mL.  EKG was reassuring per Dr. Vanita Panda.  BMP showed a glucose of 145 mg/dL, but is otherwise normal.  Levels 1.8 mg/dL.  Imaging: His chest radiograph showed chronic bronchitic change with increased interstitial lung markings noted bilaterally.  There was mild aortic atherosclerosis with aneurysm.  CTA chest/abdomen/pelvis did not show any PE, dissection or aneurysm.  There was  aortic and coronary atherosclerosis.  Emphysema, prior granulomatous disease.  Please see images and full radiology report for further detail.   Assessment/Plan Principal Problem:   Chest pain Pleuritic chest pain highly suspicious for pericarditis. Observation/telemetry. Continue supplemental oxygen. Trend troponin levels. Continue aspirin. Check EKG in a.m. Check echocardiogram in a.m.  Active Problems:   Elevated troponin As above. Trend level.    COPD with acute exacerbation (HCC) Continue supplemental oxygen. Continue Solu-Medrol. Schedule and as needed bronchodilators. Smoking cessation advised.    Elevated d-dimer CTA of chest was reassuring.    Leukocytosis No obvious source or fever. Work up does not demonstrate possible etiologies so far. Follow-up WBC level in a.m. Consider outpatient hematology evaluation if persistent.    Type 2 diabetes mellitus ([place]) Continue Invokana or formulary equivalent. Continue metformin 1000 mg p.o. twice daily. CBG monitoring with regular insulin sliding scale while on steroids.    Hyperlipidemia Continue pravastatin.    Esophageal reflux Protonix 40 mg p.o. daily.    Essential tremor Continue primidone.    Depression Continue Wellbutrin SR 150 mg p.o. twice daily.    Hypertension Continue lisinopril 5 mg p.o. daily. Monitor blood pressure, renal function electrolytes.    Tobacco abuse Nicotine replacement therapy ordered. Staff to provide smoking cessation information.    DVT prophylaxis: On heparin infusion. Code Status: Full code. Family Communication:  Disposition Plan: Will transfer to [facility] for cardiology evaluation..   Consultants:   Johnny Bridge, MD (cardiology).  Procedures:   None.  Antimicrobials:   None.   Subjective: States that his chest pain is better.  Objective: His wheezing has resolved.  Vitals:   01/20/18 2231 01/20/18 2351 01/21/18 4008  01/21/18 0741  BP: 134/85   125/84   Pulse: 100  85   Resp: 20  20   Temp: 98.4 F (36.9 C)  98.2 F (36.8 C)   TempSrc: Oral  Oral   SpO2: 95% 97% 96% 94%  Weight:      Height:        Intake/Output Summary (Last 24 hours) at 01/21/2018 0835 Last data filed at 01/21/2018 0730 Gross per 24 hour  Intake 1273.33 ml  Output 425 ml  Net 848.33 ml   Filed Weights   01/20/18 1717  Weight: 93.9 kg    Examination:  General exam: Appears calm and comfortable  Respiratory system: Clear to auscultation. Respiratory effort normal. Cardiovascular system: S1 & S2 heard, RRR. No JVD, murmurs, rubs, gallops or clicks. No pedal edema. Gastrointestinal system: Abdomen is nondistended, soft and nontender. No organomegaly or masses felt. Normal bowel sounds heard. Central nervous system: Alert and oriented. No focal neurological deficits. Extremities: Symmetric 5 x 5 power. Skin: No rashes, lesions or ulcers Psychiatry: Judgement and insight appear normal. Mood & affect appropriate.   Data Reviewed: I have personally reviewed following labs and imaging studies  CBC: Recent Labs  Lab 01/20/18 1730  WBC 23.0*  HGB 17.6*  HCT 50.9  MCV 94.3  PLT 301   Basic Metabolic Panel: Recent Labs  Lab 01/20/18 1730 01/20/18 2011  NA 136  --   K 4.2  --   CL 104  --   CO2 22  --   GLUCOSE 145*  --   BUN 12  --   CREATININE 0.72  --   CALCIUM 9.6  --   MG  --  1.8   GFR: Estimated Creatinine Clearance: 104.9 mL/min (by C-G formula based on SCr of 0.72 mg/dL). Liver Function Tests: No results for input(s): AST, ALT, ALKPHOS, BILITOT, PROT, ALBUMIN in the last 168 hours. No results for input(s): LIPASE, AMYLASE in the last 168 hours. No results for input(s): AMMONIA in the last 168 hours. Coagulation Profile: No results for input(s): INR, PROTIME in the last 168 hours. Cardiac Enzymes: Recent Labs  Lab 01/20/18 1730 01/20/18 2011 01/21/18 0628  TROPONINI 0.03* 0.03* 0.06*   BNP (last 3 results) No results  for input(s): PROBNP in the last 8760 hours. HbA1C: No results for input(s): HGBA1C in the last 72 hours. CBG: Recent Labs  Lab 01/20/18 2240 01/21/18 0742  GLUCAP 168* 233*   Lipid Profile: No results for input(s): CHOL, HDL, LDLCALC, TRIG, CHOLHDL, LDLDIRECT in the last 72 hours. Thyroid Function Tests: No results for input(s): TSH, T4TOTAL, FREET4, T3FREE, THYROIDAB in the last 72 hours. Anemia Panel: No results for input(s): VITAMINB12, FOLATE, FERRITIN, TIBC, IRON, RETICCTPCT in the last 72 hours. Sepsis Labs: No results for input(s): PROCALCITON, LATICACIDVEN in the last 168 hours.  No results found for this or any previous visit (from the past 240 hour(s)).     Radiology Studies: Dg Chest 2 View  Result Date: 01/20/2018 CLINICAL DATA:  Mid chest pain beginning today. EXAM: CHEST - 2 VIEW COMPARISON:  03/28/2017 FINDINGS: Normal heart size with mild aortic atherosclerosis. No aneurysm. Chronic bronchitic change of the lungs without alveolar consolidation. Osteoarthritis of the [facility] and glenohumeral joints. Surgical anchors project over the right humeral head. No effusion or pneumothorax. Stable mild degenerative change along the dorsal spine. IMPRESSION: Chronic bronchitic change with increased interstitial lung markings noted bilaterally. Mild aortic atherosclerosis without aneurysm. Electronically Signed   By: Meredith Leeds.D.  On: 01/20/2018 17:49   Ct Angio Chest/abd/pel For Dissection W And/or W/wo  Result Date: 01/20/2018 CLINICAL DATA:  Per ED notes: Pt reports chest pain that began around 1300 this afternoon while sitting on couch. Pt reports pain is worse with movement and inspiration. Pt belching in triage. NAD noted. Airway patent. EXAM: CT ANGIOGRAPHY CHEST, ABDOMEN AND PELVIS TECHNIQUE: Multidetector CT imaging through the chest, abdomen and pelvis was performed using the standard protocol during bolus administration of intravenous contrast. Multiplanar reconstructed  images and MIPs were obtained and reviewed to evaluate the vascular anatomy. CONTRAST:  125mL ISOVUE-370 IOPAMIDOL (ISOVUE-370) INJECTION 76% COMPARISON:  CT of the abdomen and pelvis on 01/14/2017 FINDINGS: CTA CHEST FINDINGS Cardiovascular: Heart size is normal. There is atherosclerotic calcification of the coronary arteries and thoracic aorta. No displaced intimal calcifications. With contrast administration there is no dissection. No pericardial effusion. Normal appearance of the pulmonary arteries accounting for the contrast bolus timing. Mediastinum/Nodes: The visualized portion of the thyroid gland has a normal appearance. Small mediastinal and hilar lymph nodes measure less than 1.5 centimeters in short axis and are likely reactive. Esophagus is normal in appearance. Lungs/Pleura: Emphysematous changes are identified at the lung apices. There are subpleural reticular changes primarily within the dependent portions of the lungs. No consolidations or pleural effusions. Musculoskeletal: No chest wall abnormality. No acute or significant osseous findings. Review of the MIP images confirms the above findings. CTA ABDOMEN AND PELVIS FINDINGS VASCULAR Aorta: There is mild atherosclerotic calcification of the abdominal aorta. No aneurysm. Celiac: There is atherosclerotic calcification at the origin of the celiac axis, not associated with significant narrowing. SMA: Patent without evidence of aneurysm, dissection, vasculitis or significant stenosis. Renals: Single renal arteries bilaterally are patent. IMA: Patent without evidence of aneurysm, dissection, vasculitis or significant stenosis. Inflow: Patent without evidence of aneurysm, dissection, vasculitis or significant stenosis. Veins: No obvious venous abnormality within the limitations of this arterial phase study. Review of the MIP images confirms the above findings. NON-VASCULAR Hepatobiliary: No focal liver abnormality is seen. No radiopaque gallstones,  biliary dilatation, or pericholecystic inflammatory changes. Pancreas: Unremarkable. No pancreatic ductal dilatation or surrounding inflammatory changes. Spleen: Numerous splenic granulomata are present. Adrenals/Urinary Tract: Adrenal glands are unremarkable. Kidneys are normal, without renal calculi, focal lesion, or hydronephrosis. Bladder is unremarkable. Stomach/Bowel: The stomach and small bowel loops are normal in appearance. Appendectomy. Large bowel is normal in appearance. Lymphatic: No significant vascular findings are present. No enlarged abdominal or pelvic lymph nodes. Reproductive: Prostate is unremarkable. Other: No abdominal wall hernia or abnormality. No abdominopelvic ascites. Review of the MIP images confirms the above findings. IMPRESSION: 1. No evidence for dissection or aneurysm. 2. Aortic atherosclerosis.  (ICD10-I70.0) 3. Coronary atherosclerosis. 4.  Emphysema (ICD10-J43.9). 5. Prior granulomatous disease. 6. Appendectomy.  No bowel obstruction. Electronically Signed   By: Nolon Nations M.D.   On: 01/20/2018 20:01     Scheduled Meds: . aspirin EC  81 mg Oral Daily  . buPROPion  150 mg Oral BID  . canagliflozin  100 mg Oral QAC breakfast  . insulin aspart  0-15 Units Subcutaneous TID WC  . ipratropium-albuterol  3 mL Nebulization TID  . linagliptin  5 mg Oral Daily  . lisinopril  10 mg Oral Daily  . metFORMIN  1,000 mg Oral BID WC  . methylPREDNISolone (SOLU-MEDROL) injection  40 mg Intravenous Q6H  . mometasone-formoterol  2 puff Inhalation BID  . pantoprazole  40 mg Oral Daily  . pravastatin  80 mg Oral  Daily  . primidone  50 mg Oral QID  . sucralfate  1 g Oral TID WC & HS   Continuous Infusions: . heparin 1,200 Units/hr (01/21/18 0639)     LOS: 1 day    Time spent: About 35 minutes were spent during this assessment.   Reubin Milan, MD [facility] Pager [phone].  If 7PM-7AM, please contact night-coverage [email or web address] Password  [facility] 01/21/2018, 8:35 AM

## 2018-01-21 NOTE — Progress Notes (Signed)
*  PRELIMINARY RESULTS* Echocardiogram 2D Echocardiogram has been performed.  Andrew [name] 01/21/2018, 9:48 AM

## 2018-01-21 NOTE — Progress Notes (Signed)
[id number] Received order s/p PCI. Left MI booklet, diabetic and heart healthy diets, smoking cessation handout, brochure on AP CRP 2 and fake cigarette. Tried to begin ed with pt but he is too sleepy. Did explain to pt that I was leaving written materials and referring to [employer] CRP 2. Encouraged pt not to smoke and left fake cigarette and handout. Will follow on Tuesday if pt not discharged. Graylon Good RN BSN 01/21/2018 2:48 PM

## 2018-01-21 NOTE — ED Notes (Signed)
Received pt from [facility]-- transfer from inpatient unit in [facility]-- had EKG changes enroute- STEMI called -- on arrival = pt is diaphoretic, pale, c/o chest pain,  Dr. Ellyn Hack at bedside on arrival --  Hep at 1200 U, NTG at 35mcg IV in right forearm IV-

## 2018-01-21 NOTE — Progress Notes (Signed)
Initial Nutrition Assessment  DOCUMENTATION CODES:  Not applicable  INTERVENTION:  Glucerna Shake po BID, each supplement provides 220 kcal and 10 grams of protein  NUTRITION DIAGNOSIS:  Increased nutrient needs related to acute illness([place]) as evidenced by estimated nutritional requirements for this condition  GOAL:  Patient will meet greater than or equal to 90% of their needs  MONITOR:  PO intake, Supplement acceptance, Labs, Weight trends, Skin  REASON FOR ASSESSMENT:  Consult COPD Protocol  ASSESSMENT:  67 y/o male PMHx Anxiety, Depression, COPD, OSA, HLD/HTN, dm2 (on insulin), CVA, Tobacco abuse. Initially Presented to [facility] w/ chest pain and SOB. Initially admitted as possible COPD exacerbation. However,early today, pt developed worsening chest pain and multiple abnormal EKGs.Transferred to [facility] for urgent LHC s/p acute MI revascularization with stent and coronary angiography.   Patient seen soon after returning from cath lab. As a result, he is still lethargic. RD reviewed how he is seeing pt per COPD protocol, though obviously more pressing events have occurred.   He says that prior to the development of his chest pain yesterday, he had no changes in his appetite or intake. He did not follow any type of therapeutic diet at home.   He does endorse recent weight loss, but says he believed it to be related to recent BG medication changes. He feels his UBW is close to 200 lbs. Per chart, he was 215-222 lbs all of 2019 up until his follow up DM visit on 8/16 when he was weighed as 207.6 lbs. This degree of weight loss does not meet malnutrition criteria   Patient is agreeable to starting oral supplements given his acutely heightened nutrition requirements. Though he does note he is currently very hungry. Last BG >280, will start Glucerna BID  Physical Exam: Mild temporal muscle wasting, mild wasting of orbital fat. Diaphoretic  Labs: wbc:15.4, BG 170-280 Meds: Invokana,  Wellbutrin, Tradjenta, methylprednisolone, PPI, Carafate  Recent Labs  Lab 01/20/18 1730 01/20/18 2011 01/21/18 0856  NA 136  --  135  K 4.2  --  4.6  CL 104  --  100  CO2 22  --  24  BUN 12  --  14  CREATININE 0.72  --  0.72  CALCIUM 9.6  --  9.2  MG  --  1.8  --   GLUCOSE 145*  --  280*   NUTRITION - FOCUSED PHYSICAL EXA:   Most Recent Value  Orbital Region  Mild depletion  Upper Arm Region  No depletion  Thoracic and Lumbar Region  No depletion  Buccal Region  No depletion  Temple Region  Mild depletion  Clavicle Bone Region  No depletion  Clavicle and Acromion Bone Region  No depletion  Scapular Bone Region  No depletion  Dorsal Hand  No depletion  Patellar Region  No depletion  Anterior Thigh Region  No depletion  Posterior Calf Region  No depletion  Edema (RD Assessment)  Mild  Hair  Reviewed  Eyes  Reviewed  Mouth  Reviewed  Skin  Reviewed  Nails  Reviewed     Diet Order:   Diet Order            Diet Heart Room service appropriate? Yes; Fluid consistency: Thin  Diet effective now             EDUCATION NEEDS:  Not appropriate for education at this time  Skin:  Skin Assessment: Reviewed RN Assessment  Last BM:  Unknown  Height:  Ht Readings from Last  1 Encounters:  01/21/18 6' 2.25" (1.886 m)   Weight:  Wt Readings from Last 1 Encounters:  01/21/18 95.7 kg   Wt Readings from Last 10 Encounters:  01/21/18 95.7 kg  01/06/18 94.2 kg  10/14/17 98.4 kg  10/06/17 98.4 kg  08/17/17 100.9 kg  08/11/17 98.9 kg  07/07/17 98.2 kg  04/06/17 98 kg  03/28/17 94.8 kg  03/14/17 98 kg   Ideal Body Weight:  87 kg  BMI:  Body mass index is 26.91 kg/m.  Estimated Nutritional Needs:  Kcal:  2200-2400 (22-25 kcal/kg bw) Protein:  120-140g Pro (1.2-1.4g/kg ibw) Fluid:  >2.2 L fluid ( 27ml/kcal)  Burtis Junes RD, LDN, CNSC Clinical Nutrition Available Tues-Sat via Pager: [phone] 01/21/2018 3:55 PM

## 2018-01-21 NOTE — Progress Notes (Signed)
Patients Ekg appears concerning. EKG results reported to Dr Olevia Bowens. Dr Olevia Bowens on floor to assess patient. Will continue to monitor

## 2018-01-22 LAB — GLUCOSE, CAPILLARY
GLUCOSE-CAPILLARY: 216 mg/dL — AB (ref 70–99)
GLUCOSE-CAPILLARY: 191 mg/dL — AB (ref 70–99)
Glucose-Capillary: 242 mg/dL — ABNORMAL HIGH (ref 70–99)

## 2018-01-22 LAB — BASIC METABOLIC PANEL
ANION GAP: 10 (ref 5–15)
BUN: 19 mg/dL (ref 8–23)
CO2: 22 mmol/L (ref 22–32)
Calcium: 9.4 mg/dL (ref 8.9–10.3)
Chloride: 104 mmol/L (ref 98–111)
Creatinine, Ser: 0.81 mg/dL (ref 0.61–1.24)
Glucose, Bld: 277 mg/dL — ABNORMAL HIGH (ref 70–99)
POTASSIUM: 4.7 mmol/L (ref 3.5–5.1)
Sodium: 136 mmol/L (ref 135–145)

## 2018-01-22 LAB — HIV ANTIBODY (ROUTINE TESTING W REFLEX): HIV Screen 4th Generation wRfx: NONREACTIVE

## 2018-01-22 LAB — CBC
HCT: 47.8 % (ref 39.0–52.0)
HEMOGLOBIN: 15.7 g/dL (ref 13.0–17.0)
MCH: 31.2 pg (ref 26.0–34.0)
MCHC: 32.8 g/dL (ref 30.0–36.0)
MCV: 95 fL (ref 78.0–100.0)
Platelets: 205 10*3/uL (ref 150–400)
RBC: 5.03 MIL/uL (ref 4.22–5.81)
RDW: 12.8 % (ref 11.5–15.5)
WBC: 16.4 10*3/uL — AB (ref 4.0–10.5)

## 2018-01-22 LAB — TROPONIN I: TROPONIN I: 0.14 ng/mL — AB (ref <0.03)

## 2018-01-22 MED ORDER — CANAGLIFLOZIN 100 MG PO TABS
100.0000 mg | ORAL_TABLET | Freq: Every day | ORAL | Status: DC
  Administered 2018-01-22 – 2018-01-25 (×4): 100 mg via ORAL
  Filled 2018-01-22 (×4): qty 1

## 2018-01-22 MED ORDER — BISOPROLOL FUMARATE 5 MG PO TABS
2.5000 mg | ORAL_TABLET | Freq: Every day | ORAL | Status: DC
  Administered 2018-01-22 – 2018-01-25 (×4): 2.5 mg via ORAL
  Filled 2018-01-22 (×4): qty 1

## 2018-01-22 MED ORDER — PREDNISONE 20 MG PO TABS
40.0000 mg | ORAL_TABLET | Freq: Every day | ORAL | Status: DC
  Administered 2018-01-22 – 2018-01-24 (×3): 40 mg via ORAL
  Filled 2018-01-22 (×3): qty 2

## 2018-01-22 NOTE — Care Management Note (Signed)
Case Management Note  Patient Details  Name: Adriell Polansky MRN: [id number] Date of Birth: Jan 28, 1951  Subjective/Objective:                ACS STEMI    Action/Plan:  Patient from home alone, independent PTA. Patient noted to be started on Brilinta. Bene check submitted, will not be resulted until Tuesday. Patient will need 30 day coverage card. No therapies rec at this time. CM will continue to follow. PCP C Vincent Coverage through [employer] and supplemental policy  Expected Discharge Date:                  Expected Discharge Plan:     In-House Referral:     Discharge planning Services  CM Consult  Post Acute Care Choice:    Choice offered to:     DME Arranged:    DME Agency:     HH Arranged:    HH Agency:     Status of Service:  In process, will continue to follow  If discussed at Long Length of Stay Meetings, dates discussed:    Additional Comments:  Carles Collet, RN 01/22/2018, 3:56 PM

## 2018-01-22 NOTE — Evaluation (Signed)
Physical Therapy Evaluation Patient Details Name: Andrew [name] MRN: [id number] DOB: 24-Mar-1951 Today's Date: 01/22/2018   History of Present Illness  67 yo admitted as STEMI s/p cath with PCI 8/31. PMHx:COPD, CAD, DM, OSA  Clinical Impression  Pt pleasant on arrival sitting in chair. Pt w/ resting tremor in all extremities, states has had for years. Pt able to ambulate min guard w/ some unsteadiness. Pt w/ decreased strength and coordination of bil LEs (see PT problem list for all). Pt will benefit from skilled PT acutely to increase independence and functional mobility and to decrease fall risk.      Follow Up Recommendations No PT follow up    Equipment Recommendations  Other (comment)(TBD)    Recommendations for Other Services       Precautions / Restrictions Precautions Precautions: None Restrictions Weight Bearing Restrictions: (RUE NWB)      Mobility  Bed Mobility               General bed mobility comments: in chair on arrival   Transfers Overall transfer level: Needs assistance Equipment used: None Transfers: Sit to/from Stand Sit to Stand: Min assist         General transfer comment: verbal cues to get feet back behind knees. multiple attempts to stand in first trial, progressed to min guard after repeated trials. (pt 6'4") Has an elevated chair at home.   Ambulation/Gait Ambulation/Gait assistance: Min guard Gait Distance (Feet): 400 Feet Assistive device: 1 person hand held assist(L hand on PT shoulder for steady) Gait Pattern/deviations: Narrow base of support;Scissoring;Drifts right/left;Step-through pattern;Decreased stride length;Leaning posteriorly Gait velocity: decreased  Gait velocity interpretation: >2.62 ft/sec, indicative of community ambulatory General Gait Details: pt min guard for steady w/ LUE on PT shoulder. Pt w/ resting tremor in all extremities, makes holding on difficult. Pt w/ posterior lean when WB on LLE. Veers R and L during  walk w/ mild scissoring.   Stairs            Wheelchair Mobility    Modified Rankin (Stroke Patients Only)       Balance Overall balance assessment: Modified Independent Sitting-balance support: Feet supported Sitting balance-Leahy Scale: Good Sitting balance - Comments: pt able to shift weight and tolerate some challenge    Standing balance support: Single extremity supported Standing balance-Leahy Scale: Fair Standing balance comment: Pt w/ need to steady on initial stand single UE, but able to remove support after                              Pertinent Vitals/Pain Pain Assessment: 0-10 Pain Location: chest (better than previously), RUE Pain Descriptors / Indicators: Aching;Sore;Discomfort Pain Intervention(s): Monitored during session;Limited activity within patient's tolerance    Home Living Family/patient expects to be discharged to:: Private residence Living Arrangements: Alone Available Help at Discharge: Family(children live near by ) Type of Home: House Home Access: Ramped entrance     Home Layout: Two level;Able to live on main level with bedroom/bathroom(upstairs only for storage ) Home Equipment: Kasandra Knudsen - single point      Prior Function Level of Independence: Independent         Comments: retired from [employer], drives, lives on farm w/ horses takes care of.      Hand Dominance        Extremity/Trunk Assessment   Upper Extremity Assessment Upper Extremity Assessment: (generally LUE WNL, RUE NWB did not assess (48 hours cath))  Lower Extremity Assessment Lower Extremity Assessment: Generalized weakness;RLE deficits/detail RLE Deficits / Details: trouble w/ RLE from multiple injuries, weak and can buckle     Cervical / Trunk Assessment Cervical / Trunk Assessment: Normal  Communication   Communication: No difficulties  Cognition Arousal/Alertness: Awake/alert Behavior During Therapy: WFL for tasks  assessed/performed Overall Cognitive Status: Within Functional Limits for tasks assessed                                        General Comments      Exercises General Exercises - Lower Extremity Long Arc Quad: AROM;10 reps;Left;Right;Seated Hip Flexion/Marching: AROM;10 reps;Right;Left;Seated   Assessment/Plan    PT Assessment Patient needs continued PT services  PT Problem List Decreased strength;Decreased mobility;Decreased safety awareness;Decreased knowledge of precautions;Decreased coordination;Decreased range of motion;Decreased balance;Decreased knowledge of use of DME       PT Treatment Interventions DME instruction;Therapeutic activities;Gait training;Therapeutic exercise;Patient/family education;Balance training;Stair training;Functional mobility training    PT Goals (Current goals can be found in the Care Plan section)  Acute Rehab PT Goals Patient Stated Goal: return home, take care of animals  PT Goal Formulation: With patient Time For Goal Achievement: 02/05/18 Potential to Achieve Goals: Good    Frequency Min 3X/week   Barriers to discharge Decreased caregiver support lives alone     Co-evaluation               AM-PAC PT "6 Clicks" Daily Activity  Outcome Measure Difficulty turning over in bed (including adjusting bedclothes, sheets and blankets)?: A Little Difficulty moving from lying on back to sitting on the side of the bed? : A Little Difficulty sitting down on and standing up from a chair with arms (e.g., wheelchair, bedside commode, etc,.)?: A Lot Help needed moving to and from a bed to chair (including a wheelchair)?: A Little Help needed walking in hospital room?: A Little Help needed climbing 3-5 steps with a railing? : A Little 6 Click Score: 17    End of Session Equipment Utilized During Treatment: Gait belt Activity Tolerance: Patient tolerated treatment well Patient left: in chair;with call bell/phone within  reach Nurse Communication: Mobility status PT Visit Diagnosis: Other abnormalities of gait and mobility (R26.89)    Time: 6270-3500 PT Time Calculation (min) (ACUTE ONLY): 19 min   Charges:   PT Evaluation $PT Eval Moderate Complexity: [address], [place]  Acute Rehab [phone]   Samuella Bruin 01/22/2018, 12:54 PM

## 2018-01-22 NOTE — Progress Notes (Signed)
Progress Note  Patient Name: Andrew [name] Date of Encounter: 01/22/2018  Primary Cardiologist: New (lives in [place])  Subjective   Sitting in bedside chair.  States that his chest pain has improved significantly since yesterday, still has somewhat of a pleuritic quality however and he did have some shortness of breath requiring a breathing treatment.  No palpitations.  No abdominal pain or emesis.  Inpatient Medications    Scheduled Meds: . aspirin EC  81 mg Oral Daily  . atorvastatin  80 mg Oral q1800  . buPROPion  150 mg Oral BID  . canagliflozin  100 mg Oral QAC breakfast  . heparin  5,000 Units Subcutaneous Q8H  . insulin aspart  0-15 Units Subcutaneous TID WC  . linagliptin  5 mg Oral Daily  . lisinopril  10 mg Oral Daily  . mouth rinse  15 mL Mouth Rinse BID  . mometasone-formoterol  2 puff Inhalation BID  . pantoprazole  40 mg Oral Daily  . primidone  50 mg Oral QID  . sodium chloride flush  3 mL Intravenous Q12H  . sucralfate  1 g Oral TID WC & HS  . ticagrelor  90 mg Oral BID   Continuous Infusions: . sodium chloride    . nitroGLYCERIN Stopped (01/21/18 1248)   PRN Meds: sodium chloride, acetaminophen, ALPRAZolam, HYDROmorphone (DILAUDID) injection, ipratropium-albuterol, naproxen, nicotine, nitroGLYCERIN, ondansetron (ZOFRAN) IV, sodium chloride flush   Vital Signs    Vitals:   01/22/18 0700 01/22/18 0745 01/22/18 0800 01/22/18 0900  BP:   122/73 120/86  Pulse:      Resp: 17  (!) 22 (!) 23  Temp:  97.6 F (36.4 C)    TempSrc:  Axillary    SpO2: 96%  97% 96%  Weight:      Height:        Intake/Output Summary (Last 24 hours) at 01/22/2018 0911 Last data filed at 01/22/2018 0900 Gross per 24 hour  Intake 1368.28 ml  Output 3950 ml  Net -2581.72 ml   Filed Weights   01/20/18 1717 01/21/18 1355  Weight: 93.9 kg 95.7 kg    Telemetry    Sinus rhythm with PVCs.  Personally reviewed.  ECG    Tracing from 01/22/2018 shows sinus rhythm with LVH and  ST elevation in inferior and anterolateral leads, early repolarization versus injury current.  Personally reviewed.  Physical Exam   GEN: No acute distress.   Neck: No JVD. Cardiac: RRR, no rub, or gallop.  Respiratory: Nonlabored.  Decreased breath sounds with slight expiratory wheeze. GI: Soft, nontender, bowel sounds present. MS: No edema; No deformity. Neuro:  Nonfocal. Psych: Alert and oriented x 3. Normal affect.  Labs    Chemistry Recent Labs  Lab 01/20/18 1730 01/21/18 0856 01/21/18 1456 01/22/18 0308  NA 136 135  --  136  K 4.2 4.6  --  4.7  CL 104 100  --  104  CO2 22 24  --  22  GLUCOSE 145* 280*  --  277*  BUN 12 14  --  19  CREATININE 0.72 0.72 0.73 0.81  CALCIUM 9.6 9.2  --  9.4  PROT  --  7.4  --   --   ALBUMIN  --  3.9  --   --   AST  --  13*  --   --   ALT  --  17  --   --   ALKPHOS  --  49  --   --   BILITOT  --  0.8  --   --   GFRNONAA >60 >60 >60 >60  GFRAA >60 >60 >60 >60  ANIONGAP 10 11  --  10     Hematology Recent Labs  Lab 01/21/18 0856 01/21/18 1456 01/22/18 0308  WBC 14.1* 15.4* 16.4*  RBC 5.02 5.27 5.03  HGB 16.4 16.4 15.7  HCT 47.6 49.6 47.8  MCV 94.8 94.1 95.0  MCH 32.7 31.1 31.2  MCHC 34.5 33.1 32.8  RDW 13.1 12.9 12.8  PLT 207 203 205    Cardiac Enzymes Recent Labs  Lab 01/21/18 0628 01/21/18 1456 01/21/18 2049 01/22/18 0308  TROPONINI 0.06* 0.05* 0.09* 0.14*   No results for input(s): TROPIPOC in the last 168 hours.   DDimer  Recent Labs  Lab 01/20/18 1730  DDIMER 2.08*     Radiology    Dg Chest 2 View  Result Date: 01/20/2018 CLINICAL DATA:  Mid chest pain beginning today. EXAM: CHEST - 2 VIEW COMPARISON:  03/28/2017 FINDINGS: Normal heart size with mild aortic atherosclerosis. No aneurysm. Chronic bronchitic change of the lungs without alveolar consolidation. Osteoarthritis of the [facility] and glenohumeral joints. Surgical anchors project over the right humeral head. No effusion or pneumothorax. Stable mild  degenerative change along the dorsal spine. IMPRESSION: Chronic bronchitic change with increased interstitial lung markings noted bilaterally. Mild aortic atherosclerosis without aneurysm. Electronically Signed   By: Ashley Royalty M.D.   On: 01/20/2018 17:49   Ct Angio Chest/abd/pel For Dissection W And/or W/wo  Result Date: 01/20/2018 CLINICAL DATA:  Per ED notes: Pt reports chest pain that began around 1300 this afternoon while sitting on couch. Pt reports pain is worse with movement and inspiration. Pt belching in triage. NAD noted. Airway patent. EXAM: CT ANGIOGRAPHY CHEST, ABDOMEN AND PELVIS TECHNIQUE: Multidetector CT imaging through the chest, abdomen and pelvis was performed using the standard protocol during bolus administration of intravenous contrast. Multiplanar reconstructed images and MIPs were obtained and reviewed to evaluate the vascular anatomy. CONTRAST:  143mL ISOVUE-370 IOPAMIDOL (ISOVUE-370) INJECTION 76% COMPARISON:  CT of the abdomen and pelvis on 01/14/2017 FINDINGS: CTA CHEST FINDINGS Cardiovascular: Heart size is normal. There is atherosclerotic calcification of the coronary arteries and thoracic aorta. No displaced intimal calcifications. With contrast administration there is no dissection. No pericardial effusion. Normal appearance of the pulmonary arteries accounting for the contrast bolus timing. Mediastinum/Nodes: The visualized portion of the thyroid gland has a normal appearance. Small mediastinal and hilar lymph nodes measure less than 1.5 centimeters in short axis and are likely reactive. Esophagus is normal in appearance. Lungs/Pleura: Emphysematous changes are identified at the lung apices. There are subpleural reticular changes primarily within the dependent portions of the lungs. No consolidations or pleural effusions. Musculoskeletal: No chest wall abnormality. No acute or significant osseous findings. Review of the MIP images confirms the above findings. CTA ABDOMEN AND  PELVIS FINDINGS VASCULAR Aorta: There is mild atherosclerotic calcification of the abdominal aorta. No aneurysm. Celiac: There is atherosclerotic calcification at the origin of the celiac axis, not associated with significant narrowing. SMA: Patent without evidence of aneurysm, dissection, vasculitis or significant stenosis. Renals: Single renal arteries bilaterally are patent. IMA: Patent without evidence of aneurysm, dissection, vasculitis or significant stenosis. Inflow: Patent without evidence of aneurysm, dissection, vasculitis or significant stenosis. Veins: No obvious venous abnormality within the limitations of this arterial phase study. Review of the MIP images confirms the above findings. NON-VASCULAR Hepatobiliary: No focal liver abnormality is seen. No radiopaque gallstones, biliary dilatation, or pericholecystic inflammatory changes. Pancreas:  Unremarkable. No pancreatic ductal dilatation or surrounding inflammatory changes. Spleen: Numerous splenic granulomata are present. Adrenals/Urinary Tract: Adrenal glands are unremarkable. Kidneys are normal, without renal calculi, focal lesion, or hydronephrosis. Bladder is unremarkable. Stomach/Bowel: The stomach and small bowel loops are normal in appearance. Appendectomy. Large bowel is normal in appearance. Lymphatic: No significant vascular findings are present. No enlarged abdominal or pelvic lymph nodes. Reproductive: Prostate is unremarkable. Other: No abdominal wall hernia or abnormality. No abdominopelvic ascites. Review of the MIP images confirms the above findings. IMPRESSION: 1. No evidence for dissection or aneurysm. 2. Aortic atherosclerosis.  (ICD10-I70.0) 3. Coronary atherosclerosis. 4.  Emphysema (ICD10-J43.9). 5. Prior granulomatous disease. 6. Appendectomy.  No bowel obstruction. Electronically Signed   By: Nolon Nations M.D.   On: 01/20/2018 20:01    Cardiac Studies   Cardiac catheterization and PCI 01/21/2018:  Ost Cx to Prox Cx  lesion is 80% stenosed.  Balloon angioplasty was performed using a BALLOON SAPPHIRE 2.0X15. Post intervention, there is a 40% residual stenosis.  Dist Cx-1 lesion is 80% stenosed.  Balloon angioplasty was performed using a BALLOON SAPPHIRE 2.0X15. Post intervention, there is a 60% residual stenosis.  Dist Cx-2 lesion is 100% stenosed. Post intervention, there is a 100% residual stenosis. --Unable to cross the lesion with wire or balloon  ------------  Ramus lesion is 95% stenosed.  A drug-eluting stent was successfully placed using a STENT SYNERGY DES 2.25X12.  Post intervention, there is a 0% residual stenosis.  ---------------------------  Dist (apical) LAD lesion is 100% stenosed. Likely CTO  RPDA-1 lesion is 80% stenosed. RPDA-2 lesion is 70% stenosed.  Ostial Post Atrio-1 lesion is 80% stenosed. Post Atrio-2 lesion is 60% stenosed.  ----------------------------  The left ventricular systolic function is normal. The left ventricular ejection fraction is 50-55% by visual estimate. LV end diastolic pressure is normal.    Severe multivessel disease, unclear what the culprit lesion is.  100% occlusion of the distal circumflex is not likely the culprit lesion as this was not able to be crossed with a wire.  The apical LAD is subtotally occluded and this could also be the source of his pain -not good PCI target  There is a 95% Ramus Intermedius lesion that was treated with DES stent (Synergy DES 2.25 mm x 12 mm postdilated 2.4 mm), without resolution of pain.  There is also ostial RPAV 80-85%.  LV gram suggests apical hypokinesis and perhaps some mild anterolateral hypokinesis as well.  Relatively normal LVEDP.  Plan: Admit to CCU for now for post PCI care.  With no obvious culprit lesion, will need to treat pain with analgesics --he has Dilaudid ordered from any pain which will be continued. Will run Aggrastat for 6 hours in case this may help reperfuse the distal LAD,  otherwise no clear obvious PCI target to treat his pain at this point. Convert from pravastatin to atorvastatin; if blood pressure tolerates, start low-dose beta-blocker.  We will need to reevaluate his symptoms, and determine potential benefit from treating the lesions of the distal right system.  Otherwise no viable PCI target options remain.  Continue COPD medications  Recommend uninterrupted dual antiplatelet therapy with Aspirin 81mg  daily and Ticagrelor 90mg  twice daily for a minimum of But would be okay to stop aspirin after 3 to 6 months.   Echocardiogram 01/21/2018: Study Conclusions  - Left ventricle: The cavity size was normal. Wall thickness was   increased in a pattern of mild LVH. Systolic function was normal.   The  estimated ejection fraction was in the range of 50% to 55%.   There is hypokinesis of the apicalinferior myocardium. There is   hypokinesis of the mid-apicalanterolateral myocardium. Left   ventricular diastolic function parameters were normal for the   patient&'s age. - Aortic valve: Trileaflet; moderately thickened, moderately   calcified leaflets. - Mitral valve: Mildly calcified leaflets . There was trivial   regurgitation. - Right atrium: Central venous pressure (est): 15 mm Hg. - Atrial septum: No defect or patent foramen ovale was identified. - Tricuspid valve: There was trivial regurgitation. - Pulmonary arteries: Systolic pressure could not be accurately   estimated. - Pericardium, extracardiac: There was no pericardial effusion.  Patient Profile     67 y.o. male with a history of hypertension, hyperlipidemia, type 2 diabetes mellitus, tobacco abuse with COPD, previous stroke, and OSA.  He is currently admitted in the setting of chest pain/ACS status post cardiac catheterization with DES intervention of the ramus intermedius and otherwise distal disease in the LAD, circumflex and RCA distribution that is currently being managed  medically.  Assessment & Plan    1.  ACS/NSTEMI, troponin I of only 0.14.  2.  Multivessel CAD status post DES intervention to 95% ramus intermedius, angioplasties of the circumflex with residual 40 to 60% stenoses, and significant distal disease involving the apical LAD, circumflex, and RPDA that were managed medically.  LVEF is 50 to 55% with wall motion normalities as outlined above.  3.  COPD with tobacco abuse.  Some of his chest pain symptoms seem more respiratory in etiology.  Chest CT showed emphysematous changes with granulomatous disease, no aortic dissection or aneurysm.  4.  Currently on high-dose Lipitor, no recent lipid panel with last LDL 70 in 2017.  5.  Type 2 diabetes mellitus.  6.  History of OSA, details not clear.  Discussed with patient and nursing.  He has had some improvement in symptoms with nebulizer treatments.  I will place him on prednisone as well.  Continue aspirin, Brilinta, Lipitor, and lisinopril.  Also try and add low-dose bisoprolol (more cardioselective beta-blocker).  Watch in unit today.   Signed, Rozann Lesches, MD  01/22/2018, 9:11 AM

## 2018-01-23 ENCOUNTER — Encounter (HOSPITAL_COMMUNITY): Payer: Self-pay | Admitting: Cardiology

## 2018-01-23 DIAGNOSIS — I214 Non-ST elevation (NSTEMI) myocardial infarction: Secondary | ICD-10-CM

## 2018-01-23 LAB — LIPID PANEL
CHOL/HDL RATIO: 2.8 ratio
Cholesterol: 132 mg/dL (ref 0–200)
HDL: 47 mg/dL (ref >40)
LDL CALC: 31 mg/dL (ref 0–99)
TRIGLYCERIDES: 272 mg/dL — AB (ref <150)
VLDL: 54 mg/dL — ABNORMAL HIGH (ref 0–40)

## 2018-01-23 LAB — BASIC METABOLIC PANEL
Anion gap: 8 (ref 5–15)
BUN: 25 mg/dL — ABNORMAL HIGH (ref 8–23)
CHLORIDE: 104 mmol/L (ref 98–111)
CO2: 27 mmol/L (ref 22–32)
Calcium: 9.7 mg/dL (ref 8.9–10.3)
Creatinine, Ser: 1.03 mg/dL (ref 0.61–1.24)
Glucose, Bld: 223 mg/dL — ABNORMAL HIGH (ref 70–99)
POTASSIUM: 4.6 mmol/L (ref 3.5–5.1)
Sodium: 139 mmol/L (ref 135–145)

## 2018-01-23 LAB — GLUCOSE, CAPILLARY
GLUCOSE-CAPILLARY: 187 mg/dL — AB (ref 70–99)
GLUCOSE-CAPILLARY: 253 mg/dL — AB (ref 70–99)
Glucose-Capillary: 234 mg/dL — ABNORMAL HIGH (ref 70–99)
Glucose-Capillary: 212 mg/dL — ABNORMAL HIGH (ref 70–99)

## 2018-01-23 LAB — CBC
HEMATOCRIT: 47.3 % (ref 39.0–52.0)
Hemoglobin: 15.6 g/dL (ref 13.0–17.0)
MCH: 31.7 pg (ref 26.0–34.0)
MCHC: 33 g/dL (ref 30.0–36.0)
MCV: 96.1 fL (ref 78.0–100.0)
PLATELETS: 201 10*3/uL (ref 150–400)
RBC: 4.92 MIL/uL (ref 4.22–5.81)
RDW: 12.9 % (ref 11.5–15.5)
WBC: 15.4 10*3/uL — ABNORMAL HIGH (ref 4.0–10.5)

## 2018-01-23 NOTE — Evaluation (Signed)
Occupational Therapy Evaluation Patient Details Name: Andrew [name] MRN: [id number] DOB: 06-22-1950 Today's Date: 01/23/2018    History of Present Illness 66 yo admitted as STEMI s/p cath with PCI 8/31. PMHx:COPD, CAD, DM, OSA   Clinical Impression   PTA, pt was living alone and was [place]. Pt currently performing ADLs and functional mobility with Supervision-Min Guard A for safety. Pt presenting with slight balance deficits. VSS throughout session. Pt would benefit from further acute OT to facilitate safe dc. Recommend dc to home once medically stable per physician.      Follow Up Recommendations  No OT follow up;Supervision - Intermittent    Equipment Recommendations  None recommended by OT    Recommendations for Other Services PT consult     Precautions / Restrictions Precautions Precautions: Fall Restrictions Weight Bearing Restrictions: No      Mobility Bed Mobility Overal bed mobility: Independent             General bed mobility comments: supine to sit  Transfers Overall transfer level: Needs assistance Equipment used: None Transfers: Sit to/from Stand Sit to Stand: Min guard;Supervision         General transfer comment: Initial Min guard A for safety with pt having slight LOB in initial stand; pt able to self correct. supervision as session progressed    Balance Overall balance assessment: Mild deficits observed, not formally tested   Sitting balance-Leahy Scale: Good       Standing balance-Leahy Scale: Good Standing balance comment: lateral sway at times with gait                           ADL either performed or assessed with clinical judgement   ADL Overall ADL's : Needs assistance/impaired Eating/Feeding: Independent   Grooming: Oral care;Supervision/safety;Set up;Standing   Upper Body Bathing: Supervision/ safety;Standing;Set up   Lower Body Bathing: Min guard;Sit to/from stand   Upper  Body Dressing : Supervision/safety;Set up;Standing Upper Body Dressing Details (indicate cue type and reason): Donning/doffing second gown Lower Body Dressing: Min guard;Sit to/from stand   Toilet Transfer: Supervision/safety(urination standing at toilet)           Functional mobility during ADLs: Min guard General ADL Comments: Pt performing ADLs and functional mobility at supervision-Min guard A level for safety.      Vision         Perception     Praxis      Pertinent Vitals/Pain Pain Assessment: No/denies pain     Hand Dominance (Writes with left and eats with right)   Extremity/Trunk Assessment Upper Extremity Assessment Upper Extremity Assessment: Overall WFL for tasks assessed;RUE deficits/detail RUE Deficits / Details: Right intention tremor as seen during ADLs RUE Coordination: decreased fine motor   Lower Extremity Assessment Lower Extremity Assessment: Defer to PT evaluation RLE Deficits / Details: trouble w/ RLE from multiple injuries, weak and can buckle    Cervical / Trunk Assessment Cervical / Trunk Assessment: Normal   Communication Communication Communication: No difficulties   Cognition Arousal/Alertness: Awake/alert Behavior During Therapy: WFL for tasks assessed/performed Overall Cognitive Status: Within Functional Limits for tasks assessed                                     General Comments  VSS throughout session. Pt reporting dizziness. BP stable    Exercises  Shoulder Instructions      Home Living Family/patient expects to be discharged to:: Private residence Living Arrangements: Alone Available Help at Discharge: Family(children live near by ) Type of Home: House Home Access: [place]: Two level;Able to live on main level with bedroom/bathroom(upstairs only for storage ) Alternate Level Stairs-Number of Steps: flight    Bathroom Shower/Tub: Teacher, early years/pre:  Standard Bathroom Accessibility: No   Home Equipment: Cane - single point          Prior Functioning/Environment Level of Independence: Independent        Comments: retired from Architect, drives, lives on farm w/ horses takes care of.         OT Problem List: Decreased activity tolerance;Impaired balance (sitting and/or standing);Cardiopulmonary status limiting activity      OT Treatment/Interventions: Self-care/ADL training;Therapeutic exercise;Energy conservation;DME and/or AE instruction;Therapeutic activities;Patient/family education    OT Goals(Current goals can be found in the care plan section) Acute Rehab OT Goals Patient Stated Goal: return home, take care of animals  OT Goal Formulation: With patient Time For Goal Achievement: 02/06/18 Potential to Achieve Goals: Good ADL Goals Pt Will Perform Grooming: (P) Independently;standing Pt Will Transfer to Toilet: (P) ambulating;regular height toilet;with modified independence Pt Will Perform Tub/Shower Transfer: (P) ambulating;with min guard assist;Tub transfer  OT Frequency: Min 2X/week   Barriers to D/C:            Co-evaluation              AM-PAC PT "6 Clicks" Daily Activity     Outcome Measure Help from another person eating meals?: None Help from another person taking care of personal grooming?: A Little Help from another person toileting, which includes using toliet, bedpan, or urinal?: A Little Help from another person bathing (including washing, rinsing, drying)?: A Little Help from another person to put on and taking off regular upper body clothing?: None Help from another person to put on and taking off regular lower body clothing?: A Little 6 Click Score: 20   End of Session Nurse Communication: Mobility status  Activity Tolerance: Patient tolerated treatment well Patient left: in bed;with call bell/phone within reach  OT Visit Diagnosis: Unsteadiness on feet (R26.81);Muscle weakness  (generalized) (M62.81)                Time: 7654-6503 OT Time Calculation (min): 22 min Charges:  OT General Charges $OT Visit: 1 Visit OT Evaluation $OT Eval Moderate Complexity: [place], OTR/L Acute Rehab Pager: [phone] Office: [place] 01/23/2018, 12:48 PM

## 2018-01-23 NOTE — Progress Notes (Signed)
Called report to RN on [address] [place]

## 2018-01-23 NOTE — Progress Notes (Signed)
Physical Therapy Treatment Patient Details Name: Andrew [name] MRN: [id number] DOB: 01-14-1951 Today's Date: 01/23/2018    History of Present Illness 67 yo admitted as STEMI s/p cath with PCI 8/31. PMHx:COPD, CAD, DM, OSA    PT Comments    Pt pleasant on arrival, just finishing nap. Pt able to progress ambulation to 1200 ft, first 400 ft w/ RW, then w/o AD. Pt w/ initial unsteadiness in standing and first steps, pt educated to hold onto something when first stands and march in place to get legs warmed up prior to walking without support. Pt claims walker will not fit in house when suggested. Pt encouraged to continue ambulating w/ nursing staff and to continue HEP, w/ report he had completed 3x since session yesterday.    Follow Up Recommendations  No PT follow up     Equipment Recommendations  None recommended by PT    Recommendations for Other Services       Precautions / Restrictions Precautions Precautions: Fall Restrictions Weight Bearing Restrictions: No    Mobility  Bed Mobility Overal bed mobility: Independent             General bed mobility comments: supine to sit  Transfers Overall transfer level: Modified independent               General transfer comment: use of bil rails to stand from chair, able to push w/ bil UE off bed mod i, initial attempt needed walker to steady balance at first  Ambulation/Gait Ambulation/Gait assistance: Supervision Gait Distance (Feet): 1200 Feet Assistive device: Rolling walker (2 wheeled);None Gait Pattern/deviations: Step-through pattern;Decreased stride length   Gait velocity interpretation: >4.37 ft/sec, indicative of normal walking speed General Gait Details: pt walked 400' with rW with steady gait and increased speed from prior session. additional 800' without AD with good stability and no need for UE support with occasional lateral sway. PT demonstrated increased stability after initial mobility this session and  last with education for UE support and caution with initial gait   Stairs             Wheelchair Mobility    Modified Rankin (Stroke Patients Only)       Balance Overall balance assessment: Mild deficits observed, not formally tested   Sitting balance-Leahy Scale: Good       Standing balance-Leahy Scale: Good Standing balance comment: lateral sway at times with gait                            Cognition Arousal/Alertness: Awake/alert Behavior During Therapy: WFL for tasks assessed/performed Overall Cognitive Status: Within Functional Limits for tasks assessed                                        Exercises      General Comments        Pertinent Vitals/Pain Pain Assessment: No/denies pain    Home Living                      Prior Function            PT Goals (current goals can now be found in the care plan section) Progress towards PT goals: Progressing toward goals    Frequency    Min 3X/week      PT Plan Current plan remains appropriate  Co-evaluation              AM-PAC PT "6 Clicks" Daily Activity  Outcome Measure  Difficulty turning over in bed (including adjusting bedclothes, sheets and blankets)?: None Difficulty moving from lying on back to sitting on the side of the bed? : None Difficulty sitting down on and standing up from a chair with arms (e.g., wheelchair, bedside commode, etc,.)?: None Help needed moving to and from a bed to chair (including a wheelchair)?: None Help needed walking in hospital room?: A Little Help needed climbing 3-5 steps with a railing? : A Little 6 Click Score: 22    End of Session Equipment Utilized During Treatment: Gait belt Activity Tolerance: Patient tolerated treatment well Patient left: in chair;with call bell/phone within reach Nurse Communication: Mobility status PT Visit Diagnosis: Other abnormalities of gait and mobility (R26.89)     Time:  1898-4210 PT Time Calculation (min) (ACUTE ONLY): 27 min  Charges:  $Gait Training: 23-37 mins                     Samuella Bruin, SPT  Acute Rehab [phone].   Samuella Bruin 01/23/2018, 10:25 AM

## 2018-01-23 NOTE — Progress Notes (Signed)
Transferred to 6E01 via wheelchair and monitor. RN to receive in room.

## 2018-01-23 NOTE — Plan of Care (Signed)
  Problem: Education: Goal: Understanding of cardiac disease, CV risk reduction, and recovery process will improve Outcome: Progressing Goal: Individualized Educational Video(s) Outcome: Progressing   Problem: Activity: Goal: Ability to tolerate increased activity will improve Outcome: Progressing   Problem: Cardiac: Goal: Ability to achieve and maintain adequate cardiovascular perfusion will improve Outcome: Progressing   Problem: Health Behavior/Discharge Planning: Goal: Ability to safely manage health-related needs after discharge will improve Outcome: Progressing   Problem: Clinical Measurements: Goal: Ability to maintain clinical measurements within normal limits will improve Outcome: Progressing Goal: Will remain free from infection Outcome: Progressing Goal: Diagnostic test results will improve Outcome: Progressing   Problem: Nutrition: Goal: Adequate nutrition will be maintained Outcome: Progressing   Problem: Pain Managment: Goal: General experience of comfort will improve Outcome: Progressing

## 2018-01-23 NOTE — Progress Notes (Addendum)
Progress Note  Patient Name: Andrew [name] Date of Encounter: 01/23/2018  Primary Cardiologist: New (lives in [place])  Subjective   Resting comfortably in bed.  Chest pain notably improved.  Nothing like he had on Friday.  Still has some soreness intermittently in the sense of more sharp chest discomfort with breathing. Overall breathing is notably improved compared to yesterday. No palpitations or nausea vomiting. Mild abdominal pain at heparin injection site but otherwise stable.  Inpatient Medications    Scheduled Meds: . aspirin EC  81 mg Oral Daily  . atorvastatin  80 mg Oral q1800  . bisoprolol  2.5 mg Oral Daily  . buPROPion  150 mg Oral BID  . canagliflozin  100 mg Oral QAC breakfast  . heparin  5,000 Units Subcutaneous Q8H  . insulin aspart  0-15 Units Subcutaneous TID WC  . linagliptin  5 mg Oral Daily  . lisinopril  10 mg Oral Daily  . mometasone-formoterol  2 puff Inhalation BID  . pantoprazole  40 mg Oral Daily  . predniSONE  40 mg Oral Q breakfast  . primidone  50 mg Oral QID  . sodium chloride flush  3 mL Intravenous Q12H  . sucralfate  1 g Oral TID WC & HS  . ticagrelor  90 mg Oral BID   Continuous Infusions: . sodium chloride    . nitroGLYCERIN Stopped (01/21/18 1248)   PRN Meds: sodium chloride, acetaminophen, ALPRAZolam, HYDROmorphone (DILAUDID) injection, ipratropium-albuterol, naproxen, nicotine, nitroGLYCERIN, ondansetron (ZOFRAN) IV, sodium chloride flush   Vital Signs    Vitals:   01/23/18 1100 01/23/18 1200 01/23/18 1218 01/23/18 1300  BP: 110/79 109/77  103/72  Pulse:      Resp: 19   16  Temp:   98.1 F (36.7 C)   TempSrc:   Oral   SpO2: 98% 97%  96%  Weight:      Height:        Intake/Output Summary (Last 24 hours) at 01/23/2018 1354 Last data filed at 01/23/2018 0934 Gross per 24 hour  Intake 1746 ml  Output 2800 ml  Net -1054 ml   Filed Weights   01/20/18 1717 01/21/18 1355  Weight: 93.9 kg 95.7 kg    Telemetry    Sinus  rhythm with PVCs.  Personally reviewed.  ECG     Sinus rhythm, rate 72 bpm.  Persistent anterior ST elevation/early repolarization with nonspecific T wave abnormalities.  Borderline criteria for previous inferior infarct, age undetermined.: Personally reviewed.  Physical Exam   GEN:  Resting comfortably.  No acute distress. Neck:  No carotid bruit or JVD.  Supple Cardiac:  Distant heart sounds but otherwise RRR with soft 1/6 SEM at RUSB.  No/R/G.  Mild chest wall tenderness Respiratory:  Nonlabored but with decreased breath sounds throughout and mid to late expiratory wheezing.  No rales or rhonchi GI:  Soft/NT/ND/NABS.  No HSM.  Left mid abdominal wall tenderness with mild bruising from heparin injections MS: No clubbing cyanosis or edema.  No deformity. Neuro/Psych:  A&O x 3.  Normal mood and affect t. Nonfocal.   Labs    Chemistry Recent Labs  Lab 01/21/18 0856 01/21/18 1456 01/22/18 0308 01/23/18 0259  NA 135  --  136 139  K 4.6  --  4.7 4.6  CL 100  --  104 104  CO2 24  --  22 27  GLUCOSE 280*  --  277* 223*  BUN 14  --  19 25*  CREATININE 0.72 0.73 0.81 1.03  CALCIUM 9.2  --  9.4 9.7  PROT 7.4  --   --   --   ALBUMIN 3.9  --   --   --   AST 13*  --   --   --   ALT 17  --   --   --   ALKPHOS 49  --   --   --   BILITOT 0.8  --   --   --   GFRNONAA >60 >60 >60 >60  GFRAA >60 >60 >60 >60  ANIONGAP 11  --  10 8     Hematology Recent Labs  Lab 01/21/18 1456 01/22/18 0308 01/23/18 0259  WBC 15.4* 16.4* 15.4*  RBC 5.27 5.03 4.92  HGB 16.4 15.7 15.6  HCT 49.6 47.8 47.3  MCV 94.1 95.0 96.1  MCH 31.1 31.2 31.7  MCHC 33.1 32.8 33.0  RDW 12.9 12.8 12.9  PLT 203 205 201    Cardiac Enzymes Recent Labs  Lab 01/21/18 0628 01/21/18 1456 01/21/18 2049 01/22/18 0308  TROPONINI 0.06* 0.05* 0.09* 0.14*   No results for input(s): TROPIPOC in the last 168 hours.   DDimer  Recent Labs  Lab 01/20/18 1730  DDIMER 2.08*     Radiology    No results  found.  Cardiac Studies   Cardiac catheterization and PCI 01/21/2018:  Ost Cx to Prox Cx lesion is 80% stenosed.  Balloon angioplasty was performed using a BALLOON SAPPHIRE 2.0X15. Post intervention, there is a 40% residual stenosis.  Dist Cx-1 lesion is 80% stenosed.  Balloon angioplasty was performed using a BALLOON SAPPHIRE 2.0X15. Post intervention, there is a 60% residual stenosis.  Dist Cx-2 lesion is 100% stenosed. Post intervention, there is a 100% residual stenosis. --Unable to cross the lesion with wire or balloon  ------------  Ramus lesion is 95% stenosed.  A drug-eluting stent was successfully placed using a STENT SYNERGY DES 2.25X12.  Post intervention, there is a 0% residual stenosis.  ---------------------------  Dist (apical) LAD lesion is 100% stenosed. Likely CTO  RPDA-1 lesion is 80% stenosed. RPDA-2 lesion is 70% stenosed.  Ostial Post Atrio-1 lesion is 80% stenosed. Post Atrio-2 lesion is 60% stenosed.  ----------------------------  The left ventricular systolic function is normal. The left ventricular ejection fraction is 50-55% by visual estimate. LV end diastolic pressure is normal.    Severe multivessel disease, unclear what the culprit lesion is.  100% occlusion of the distal circumflex is not likely the culprit lesion as this was not able to be crossed with a wire.  The apical LAD is subtotally occluded and this could also be the source of his pain -not good PCI target  There is a 95% Ramus Intermedius lesion that was treated with DES stent (Synergy DES 2.25 mm x 12 mm postdilated 2.4 mm), without resolution of pain.  There is also ostial RPAV 80-85%.  LV gram suggests apical hypokinesis and perhaps some mild anterolateral hypokinesis as well.  Relatively normal LVEDP.  Plan: Admit to CCU for now for post PCI care.  With no obvious culprit lesion, will need to treat pain with analgesics --he has Dilaudid ordered from any pain which will  be continued. Will run Aggrastat for 6 hours in case this may help reperfuse the distal LAD, otherwise no clear obvious PCI target to treat his pain at this point. Convert from pravastatin to atorvastatin; if blood pressure tolerates, start low-dose beta-blocker.  We will need to reevaluate his symptoms, and determine potential benefit from treating the  lesions of the distal right system.  Otherwise no viable PCI target options remain.  Continue COPD medications  Recommend uninterrupted dual antiplatelet therapy with Aspirin 81mg  daily and Ticagrelor 90mg  twice daily for a minimum of But would be okay to stop aspirin after 3 to 6 months.   Echocardiogram 01/21/2018: Mild LVH.  Low normal EF of 50 to 55%.  Hypokinesis of the mid type and apical anterolateral wall.  Normal diastolic pressures.  Moderate aortic valve thickening with no stenosis (sclerosis).  Elevated CVP at 15 mmHg.  Cannot accurately assess pulmonary pressures.  No pericardial effusion.  Patient Profile     66 y.o. male with a history of hypertension, hyperlipidemia, type 2 diabetes mellitus, tobacco abuse with COPD, previous stroke, and OSA.  Was transferred from [facility] where he was admitted for chest pain and dyspnea  -> due to concerns for possible anterior ST elevation, he was transferred to [facility] for emergent catheterization in the setting of chest pain/ACS status post cardiac catheterization with DES intervention of the ramus intermedius and otherwise distal disease in the LAD, circumflex and RCA distribution that is currently being managed medically.   Assessment & Plan    1.  ACS/NSTEMI, troponin I of only 0.14.  (This is an adjustment from her original diagnosis as a more correct diagnosis based on data).  Despite possible post STEMI presentation, physiologically this is not a STEMI and that he had minimal troponin elevation and no obvious acute occlusion.  Question if he may have had a recent event and his  current chest pain is related to pericarditis type pain as it is somewhat sharp in nature.  However really seems to be doing better.  2.  Multivessel CAD status post DES intervention to 95% ramus intermedius, angioplasties of the circumflex with residual 40 to 60% stenoses, and significant distal disease involving the apical LAD, circumflex, and RPDA that were managed medically.  LVEF is 50 to 55% with wall motion normalities as outlined above.  On aspirin plus Brilinta for recent stent  On high-dose statin   Tolerating low-dose bisoprolol and medium dose lisinopril.  3.  COPD with tobacco abuse.  Some of his chest pain symptoms seem more respiratory in etiology.  Chest CT showed emphysematous changes with granulomatous disease, no aortic dissection or aneurysm.  Question if some of his symptoms are related to pulmonary nature as opposed to cardiac given minimal troponin elevation and prolonged pain. -->  Seems to be tolerating bisoprolol.  Currently on prednisone.  Would recommend starting to taper down tomorrow  Restart inhaled steroids as weaning off oral  On as needed nebulizers.  4.  Hyperlipidemia, goal less than 70: High-dose statin.  5.  Type 2 diabetes mellitus: On Invokana (in lieu of Jardiance), on Tradjenta and sliding scale insulin.  Stable glycemic control, but still requiring sliding scale. ->  Metformin remains on hold  6.  History of OSA, details not clear.->  Not currently on CPAP.  Smoking cessation counseling   Overall stable hemodynamically today.  Blood pressures are still borderline unable to titrate medications. Plan to begin to titrate prednisone down tomorrow but will keep current dose for now along with nebulizers.  Stable for transfer to telemetry today.   Signed, Glenetta Hew, MD  01/23/2018, 1:54 PM

## 2018-01-24 ENCOUNTER — Other Ambulatory Visit: Payer: Self-pay

## 2018-01-24 LAB — GLUCOSE, CAPILLARY
GLUCOSE-CAPILLARY: 150 mg/dL — AB (ref 70–99)
Glucose-Capillary: 161 mg/dL — ABNORMAL HIGH (ref 70–99)
Glucose-Capillary: 185 mg/dL — ABNORMAL HIGH (ref 70–99)
Glucose-Capillary: 200 mg/dL — ABNORMAL HIGH (ref 70–99)
Glucose-Capillary: 208 mg/dL — ABNORMAL HIGH (ref 70–99)

## 2018-01-24 LAB — POCT ACTIVATED CLOTTING TIME
ACTIVATED CLOTTING TIME: 280 s
Activated Clotting Time: 120 seconds
Activated Clotting Time: 246 seconds

## 2018-01-24 LAB — HEMOGLOBIN A1C
Hgb A1c MFr Bld: 7.2 % — ABNORMAL HIGH (ref 4.8–5.6)
Mean Plasma Glucose: 159.94 mg/dL

## 2018-01-24 MED ORDER — METFORMIN HCL 500 MG PO TABS
1000.0000 mg | ORAL_TABLET | Freq: Two times a day (BID) | ORAL | Status: DC
  Administered 2018-01-24 – 2018-01-25 (×2): 1000 mg via ORAL
  Filled 2018-01-24 (×2): qty 2

## 2018-01-24 MED ORDER — PREDNISONE 10 MG PO TABS: 10.0000 mg | ORAL_TABLET | Freq: Every day | ORAL | Status: DC

## 2018-01-24 MED ORDER — INSULIN ASPART 100 UNIT/ML ~~LOC~~ SOLN: 0.0000 [IU] | Freq: Every day | SUBCUTANEOUS | Status: DC

## 2018-01-24 MED ORDER — PREDNISONE 20 MG PO TABS
30.0000 mg | ORAL_TABLET | Freq: Every day | ORAL | Status: DC
  Administered 2018-01-25: 30 mg via ORAL
  Filled 2018-01-24: qty 1

## 2018-01-24 MED ORDER — PREDNISONE 20 MG PO TABS: 20.0000 mg | ORAL_TABLET | Freq: Every day | ORAL | Status: DC

## 2018-01-24 MED ORDER — INSULIN ASPART 100 UNIT/ML ~~LOC~~ SOLN: 0.0000 [IU] | Freq: Three times a day (TID) | SUBCUTANEOUS | Status: DC

## 2018-01-24 MED FILL — Nitroglycerin IV Soln 100 MCG/ML in D5W: INTRA_ARTERIAL | Qty: 10 | Status: AC

## 2018-01-24 NOTE — Patient Outreach (Addendum)
[facility]) Care Management  01/24/2018  Andrew [name] 1950/08/01 [id number]    RN Health Coach  Notified patient has been admitted to the hospital.  However, patient will be followed by one of Hospital Liaison.   Plan RN Health coach will close the case at this time.  Lazaro Arms RN, BSN, [place] Direct Dial:  [phone]  Fax: [phone]

## 2018-01-24 NOTE — Consult Note (Signed)
   [facility] CM Inpatient Consult   01/24/2018  Andrew [name] 1950/12/14 [id number]  Patient is currently active with [facility] for chronic disease management services.  Patient has been engaged by a [place] for Diabetes Management.  Our community based plan of care has focused on disease management.  Patient will be followed for post hospital  transition needs and disposition and will be evaluated for ongoing [facility] disease process education.  Will make Inpatient Case Manager aware that Andrew [name] Management following.  Met with the patient at the bedside.  He endorses ongoing follow up with home transition. Consent form signed and folder with [facility] information and contacts given.  Patient states he knows he will be on new medications for his heart. He also endorse Dr. Assunta Found as his primary care provider.  This office provides the transition of care calls and appointment follow up. Patient will be assigned to community for follow up due to new diagnosis.  Of note, [name] Care Management services does not replace or interfere with any services that are needed or arranged by inpatient case management or social work.  For additional questions or referrals please contact:   Natividad Brood, RN BSN [facility] Liaison  [phone] business mobile phone Toll free office [phone]

## 2018-01-24 NOTE — Progress Notes (Signed)
Progress Note  Patient Name: Andrew [name] Date of Encounter: 01/24/2018  Primary Cardiologist: Dr. Glenetta Hew  Subjective   Postop day 5 PCI circumflex, stent ramus branch in setting of non-STEMI.  Patient's presentation was primarily pulmonary.  He has pleuritic chest pain on a steroid taper.  Inpatient Medications    Scheduled Meds: . aspirin EC  81 mg Oral Daily  . atorvastatin  80 mg Oral q1800  . bisoprolol  2.5 mg Oral Daily  . buPROPion  150 mg Oral BID  . canagliflozin  100 mg Oral QAC breakfast  . heparin  5,000 Units Subcutaneous Q8H  . insulin aspart  0-15 Units Subcutaneous TID WC  . linagliptin  5 mg Oral Daily  . lisinopril  10 mg Oral Daily  . mometasone-formoterol  2 puff Inhalation BID  . pantoprazole  40 mg Oral Daily  . predniSONE  40 mg Oral Q breakfast  . primidone  50 mg Oral QID  . sodium chloride flush  3 mL Intravenous Q12H  . sucralfate  1 g Oral TID WC & HS  . ticagrelor  90 mg Oral BID   Continuous Infusions: . sodium chloride    . nitroGLYCERIN Stopped (01/21/18 1248)   PRN Meds: sodium chloride, acetaminophen, ALPRAZolam, HYDROmorphone (DILAUDID) injection, ipratropium-albuterol, naproxen, nicotine, nitroGLYCERIN, ondansetron (ZOFRAN) IV, sodium chloride flush   Vital Signs    Vitals:   01/24/18 0600 01/24/18 0633 01/24/18 0843 01/24/18 0906  BP:  100/66 108/71   Pulse:  70    Resp:      Temp:  98.3 F (36.8 C)    TempSrc:  Oral    SpO2:  98%  96%  Weight: 90.3 kg     Height:        Intake/Output Summary (Last 24 hours) at 01/24/2018 0939 Last data filed at 01/24/2018 0914 Gross per 24 hour  Intake 1540 ml  Output 2500 ml  Net -960 ml   Filed Weights   01/20/18 1717 01/21/18 1355 01/24/18 0600  Weight: 93.9 kg 95.7 kg 90.3 kg    Telemetry    Sinus rhythm with occasional PVCs- Personally Reviewed  ECG    Not performed today- Personally Reviewed  Physical Exam   GEN: No acute distress.   Neck: No JVD Cardiac:  RRR, no murmurs, rubs, or gallops.  Respiratory: Clear to auscultation bilaterally. GI: Soft, nontender, non-distended  MS: No edema; No deformity. Neuro:  Nonfocal  Psych: Normal affect   Labs    Chemistry Recent Labs  Lab 01/21/18 0856 01/21/18 1456 01/22/18 0308 01/23/18 0259  NA 135  --  136 139  K 4.6  --  4.7 4.6  CL 100  --  104 104  CO2 24  --  22 27  GLUCOSE 280*  --  277* 223*  BUN 14  --  19 25*  CREATININE 0.72 0.73 0.81 1.03  CALCIUM 9.2  --  9.4 9.7  PROT 7.4  --   --   --   ALBUMIN 3.9  --   --   --   AST 13*  --   --   --   ALT 17  --   --   --   ALKPHOS 49  --   --   --   BILITOT 0.8  --   --   --   GFRNONAA >60 >60 >60 >60  GFRAA >60 >60 >60 >60  ANIONGAP 11  --  10 8  Hematology Recent Labs  Lab 01/21/18 1456 01/22/18 0308 01/23/18 0259  WBC 15.4* 16.4* 15.4*  RBC 5.27 5.03 4.92  HGB 16.4 15.7 15.6  HCT 49.6 47.8 47.3  MCV 94.1 95.0 96.1  MCH 31.1 31.2 31.7  MCHC 33.1 32.8 33.0  RDW 12.9 12.8 12.9  PLT 203 205 201    Cardiac Enzymes Recent Labs  Lab 01/21/18 0628 01/21/18 1456 01/21/18 2049 01/22/18 0308  TROPONINI 0.06* 0.05* 0.09* 0.14*   No results for input(s): TROPIPOC in the last 168 hours.   BNPNo results for input(s): BNP, PROBNP in the last 168 hours.   DDimer  Recent Labs  Lab 01/20/18 1730  DDIMER 2.08*     Radiology    No results found.  Cardiac Studies   2D echocardiogram 01/21/2018  Study Conclusions  - Left ventricle: The cavity size was normal. Wall thickness was   increased in a pattern of mild LVH. Systolic function was normal.   The estimated ejection fraction was in the range of 50% to 55%.   There is hypokinesis of the apicalinferior myocardium. There is   hypokinesis of the mid-apicalanterolateral myocardium. Left   ventricular diastolic function parameters were normal for the   patient&'s age. - Aortic valve: Trileaflet; moderately thickened, moderately   calcified leaflets. - Mitral  valve: Mildly calcified leaflets . There was trivial   regurgitation. - Right atrium: Central venous pressure (est): 15 mm Hg. - Atrial septum: No defect or patent foramen ovale was identified. - Tricuspid valve: There was trivial regurgitation. - Pulmonary arteries: Systolic pressure could not be accurately   estimated. - Pericardium, extracardiac: There was no pericardial effusion.  Cardiac catheterization/PCI (01/21/2018)  Conclusion     Ost Cx to Prox Cx lesion is 80% stenosed.  Balloon angioplasty was performed using a BALLOON SAPPHIRE 2.0X15. Post intervention, there is a 40% residual stenosis.  Dist Cx-1 lesion is 80% stenosed.  Balloon angioplasty was performed using a BALLOON SAPPHIRE 2.0X15. Post intervention, there is a 60% residual stenosis.  Dist Cx-2 lesion is 100% stenosed. Post intervention, there is a 100% residual stenosis. --Unable to cross the lesion with wire or balloon  ------------  Ramus lesion is 95% stenosed.  A drug-eluting stent was successfully placed using a STENT SYNERGY DES 2.25X12.  Post intervention, there is a 0% residual stenosis.  ---------------------------  Dist (apical) LAD lesion is 100% stenosed. Likely CTO  RPDA-1 lesion is 80% stenosed. RPDA-2 lesion is 70% stenosed.  Ostial Post Atrio-1 lesion is 80% stenosed. Post Atrio-2 lesion is 60% stenosed.  ----------------------------  The left ventricular systolic function is normal. The left ventricular ejection fraction is 50-55% by visual estimate. LV end diastolic pressure is normal.    Severe multivessel disease, unclear what the culprit lesion is.  100% occlusion of the distal circumflex is not likely the culprit lesion as this was not able to be crossed with a wire.  The apical LAD is subtotally occluded and this could also be the source of his pain -not good PCI target  There is a 95% Ramus Intermedius lesion that was treated with DES stent (Synergy DES 2.25 mm x 12 mm  postdilated 2.4 mm), without resolution of pain.  There is also ostial RPAV 80-85%.  LV gram suggests apical hypokinesis and perhaps some mild anterolateral hypokinesis as well.  Relatively normal LVEDP.  Plan: Admit to CCU for now for post PCI care.  With no obvious culprit lesion, will need to treat pain with analgesics --he has  Dilaudid ordered from any pain which will be continued. Will run Aggrastat for 6 hours in case this may help reperfuse the distal LAD, otherwise no clear obvious PCI target to treat his pain at this point. Convert from pravastatin to atorvastatin; if blood pressure tolerates, start low-dose beta-blocker.  We will need to reevaluate his symptoms, and determine potential benefit from treating the lesions of the distal right system.  Otherwise no viable PCI target options remain.     Patient Profile   67 y.o. male with a history of hypertension, hyperlipidemia, type 2 diabetes mellitus, tobacco abuse with COPD, previous stroke, and OSA.  Was transferred from [facility] where he was admitted for chest pain and dyspnea  -> due to concerns for possible anterior ST elevation, he was transferred to [facility] for emergent catheterization in the setting of chest pain/ACS status post cardiac catheterization with DES intervention of the ramus intermedius and otherwise distal disease in the LAD, circumflex and RCA distribution that is currently being managed medically.  Assessment & Plan    1: Non-STEMI- peak troponin of 0.14.  Unclear if this is related to ACS or demand ischemia from COPD flare.  2: Multivessel CAD- status post cardiac catheterization by Dr. Ellyn Hack 01/21/2018 revealing multivessel CAD with an occluded apical LAD, high-grade mid ramus branch stenosis which was stented, high-grade ostial/proximal circumflex which was angioplastied with an occluded distal circumflex and high-grade ostial PLA branch stenosis.  He does have an apical wall motion of normality but  preserved LV function.  It is unclear if he had "a culprit lesion" or whether his chest pain was related to COPD.  In any event, he is on dual antiplatelet therapy including aspirin and Brilinta which may be problematic given his COPD.  3: COPD- long history tobacco abuse currently smoking 1 to 1/2 packs/day.  CT scan did show evidence edematous changes.  He was wheezing when he was admitted currently, his lungs are clear.  He is on a steroid taper.  We will begin Symbicort and rescue inhaler.  4: Hyperlipidemia- on high-dose statin therapy  5: Type 2 diabetes- on Invokana and Tradjenta as well as metformin which was held at the time of cath which can be reinitiated.  He is on sliding scale insulin.  6: Obstructive sleep apnea- not currently on CPAP.  Overall he is clinically improved.  Will initiate a steroid taper.  We will add inhaled bronchodilators as well as inhaled steroids.  We will treat the remainder of his CAD medically.  He is on dual antiplatelet therapy.  He will ambulate with cardiac rehab today with potential discharge for tomorrow.  Since he lives in [place] he can follow-up with our office there unless he prefers to come to [place].    For questions or updates, please contact [facility] [facility] Please consult [email or web address] for contact info under Cardiology/STEMI.      Signed, Quay Burow, MD  01/24/2018, 9:39 AM

## 2018-01-24 NOTE — Care Management (Signed)
01-24-18  BENEFITS CHECK :  #0 .     S/W  KAISHON @  [facility] #  [phone] OPT- 2  1. BRILINTA   90 MG BID COVER- YES CO-PAY-  $ 47.00 TIER-  DRUG PRIOR APPROVAL- NO  2. TICAGRELOR  : NONE FORMULARY  PREFERRED PHARMACY :  YES  - [facility]

## 2018-01-24 NOTE — Care Management Important Message (Signed)
Important Message  Patient Details  Name: Andrew [name] MRN: [id number] Date of Birth: 1951/01/24   Medicare Important Message Given:  Yes    [name] [place] 01/24/2018, 4:44 PM

## 2018-01-24 NOTE — Progress Notes (Signed)
CARDIAC REHAB PHASE I   PRE:  Rate/Rhythm: 70 SR  BP:  Supine:   Sitting: 92/67  Standing: 104/69   SaO2: 96%RA  MODE:  Ambulation: 470 ft   POST:  Rate/Rhythm: 79 SR  BP:  Supine:   Sitting: 100/64  Standing:    SaO2: 96%RA 1018-1120 Pt felt a little dizzy this morning with walk so was not able to go as far as yesterday. Stayed about the same with walk and did not improve. Pt walked 470 ft on RA with steady gait and hand held asst. MI education completed with pt who voiced understanding. Stressed importance of brilinta with stent. Needs to see case manager for brilinta card. Reviewed MI restrictions, NTG use, ex ed and gave diabetic and heart healthy diets. Discussed smoking cessation and pt has fake cigarette that I left on Saturday. Pt is going to try to quit cold [place]. Have referred to [place] [facility] but pt stated program is too far for him to drive with watching his finances. Would take about 40 minutes to drive one way. Pt has been working to bring A1c down.  Graylon Good, RN BSN  01/24/2018 11:15 AM

## 2018-01-24 NOTE — Progress Notes (Signed)
Occupational Therapy Treatment Patient Details Name: Andrew [name] MRN: [id number] DOB: Oct 19, 1950 Today's Date: 01/24/2018    History of present illness 67 yo admitted as STEMI s/p cath with PCI 8/31. PMHx:COPD, CAD, DM, OSA   OT comments  Pt demonstrating some progress toward OT goals this session. He was able to complete simulated tub/shower transfer with overall min guard assist this session and educated pt concerning safe showering strategies to avoid falls in the bathroom. Pt additionally requiring min guard assist for LB dressing tasks as well. Educated pt concerning activity progression and energy conservation strategies to implement into daily routine. OT will continue to follow while admitted.    Follow Up Recommendations  No OT follow up;Supervision - Intermittent    Equipment Recommendations  None recommended by OT    Recommendations for Other Services PT consult    Precautions / Restrictions Precautions Precautions: Fall Restrictions Weight Bearing Restrictions: No       Mobility Bed Mobility Overal bed mobility: Independent                Transfers Overall transfer level: Needs assistance Equipment used: None Transfers: Sit to/from Stand Sit to Stand: Supervision         General transfer comment: Supervision for basic sit<>stand transfers. Min guard for shower transfers.     Balance Overall balance assessment: Mild deficits observed, not formally tested                                         ADL either performed or assessed with clinical judgement   ADL Overall ADL's : Needs assistance/impaired Eating/Feeding: Independent   Grooming: Oral care;Standing;Supervision/safety               Lower Body Dressing: Min guard;Sit to/from stand Lower Body Dressing Details (indicate cue type and reason): increased difficulty accessing feet Toilet Transfer: Supervision/safety Toilet Transfer Details (indicate cue type and reason):  close supervision for safety      Tub/ Shower Transfer: Tub transfer;Min guard;Ambulation   Functional mobility during ADLs: Supervision/safety;Min guard General ADL Comments: Pt with mild instability but able to self-correct. Educated concerning safe shower strategies as well as techniques for safe tub/shower transfers.      Vision       Perception     Praxis      Cognition Arousal/Alertness: Awake/alert Behavior During Therapy: WFL for tasks assessed/performed Overall Cognitive Status: Within Functional Limits for tasks assessed                                          Exercises     Shoulder Instructions       General Comments Pt reports some dizziness earlier today but none mentioned during session.     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, work on his farm OT Goal Formulation: With patient Time For Goal Achievement: 02/06/18 Potential to Achieve Goals: Good  Plan Discharge plan remains appropriate    Co-evaluation                 AM-PAC PT "6 Clicks" Daily Activity     Outcome Measure   Help from another person eating meals?: None Help from another person taking care of personal grooming?: None Help from another person toileting, which includes using toliet, bedpan, or urinal?: A Little Help from another person bathing (including washing, rinsing, drying)?: A Little Help from another person to put on and taking off regular upper body clothing?: None Help from another person to put on and taking off regular lower body clothing?: A Little 6 Click Score:  21    End of Session    OT Visit Diagnosis: Unsteadiness on feet (R26.81);Muscle weakness (generalized) (M62.81)   Activity Tolerance Patient tolerated treatment well   Patient Left in bed;with call bell/phone within reach   Nurse Communication Mobility status        Time: 3779-3968 OT Time Calculation (min): 15 min  Charges: OT General Charges $OT Visit: 1 Visit OT Treatments $Self Care/Home Management : 8-22 mins  Belva Agee, OTR/L [place] Pager [phone] Office [place] 01/24/2018, 3:31 PM

## 2018-01-25 ENCOUNTER — Encounter (HOSPITAL_COMMUNITY): Payer: Self-pay | Admitting: Cardiology

## 2018-01-25 LAB — BASIC METABOLIC PANEL
Anion gap: 10 (ref 5–15)
BUN: 24 mg/dL — ABNORMAL HIGH (ref 8–23)
CALCIUM: 9.1 mg/dL (ref 8.9–10.3)
CO2: 23 mmol/L (ref 22–32)
CREATININE: 0.91 mg/dL (ref 0.61–1.24)
Chloride: 103 mmol/L (ref 98–111)
GFR calc non Af Amer: >60 mL/min (ref >60)
Glucose, Bld: 193 mg/dL — ABNORMAL HIGH (ref 70–99)
Potassium: 3.9 mmol/L (ref 3.5–5.1)
SODIUM: 136 mmol/L (ref 135–145)

## 2018-01-25 LAB — GLUCOSE, CAPILLARY
GLUCOSE-CAPILLARY: 238 mg/dL — AB (ref 70–99)
Glucose-Capillary: 189 mg/dL — ABNORMAL HIGH (ref 70–99)

## 2018-01-25 MED ORDER — TICAGRELOR 90 MG PO TABS: 90.0000 mg | ORAL_TABLET | Freq: Two times a day (BID) | ORAL | 0 refills | Status: DC

## 2018-01-25 MED ORDER — ATORVASTATIN CALCIUM 80 MG PO TABS: 80.0000 mg | ORAL_TABLET | Freq: Every day | ORAL | 0 refills | Status: DC

## 2018-01-25 MED ORDER — PREDNISONE 10 MG PO TABS: ORAL_TABLET | ORAL | 0 refills | Status: DC

## 2018-01-25 MED ORDER — NITROGLYCERIN 0.4 MG SL SUBL: 0.4000 mg | SUBLINGUAL_TABLET | SUBLINGUAL | 0 refills | Status: DC | PRN

## 2018-01-25 MED ORDER — BISOPROLOL FUMARATE 5 MG PO TABS: 2.5000 mg | ORAL_TABLET | Freq: Every day | ORAL | 1 refills | Status: DC

## 2018-01-25 MED FILL — ATORVASTATIN CALCIUM 80 MG: 80 | 30 days supply | Qty: 30 | Fill #0

## 2018-01-25 MED FILL — NITROGLYCERIN 0.4 MG TAB SL: 0.4 | 25 days supply | Qty: 25 | Fill #0

## 2018-01-25 MED FILL — BRILINTA 90 MG TABLET: 90 | 30 days supply | Qty: 60 | Fill #0

## 2018-01-25 MED FILL — predniSONE 20 MG TABS: 20 | 5 days supply | Qty: 5 | Fill #0

## 2018-01-25 NOTE — Discharge Summary (Addendum)
Discharge Summary    Patient ID: Andrew [name],  MRN: [id number], DOB/AGE: [birth year] 68 y.o.  Admit date: 01/20/2018 Discharge date: 01/25/2018  Primary Care Provider: Eustaquio Maize Primary Cardiologist: Dr. Ellyn Hack   Discharge Diagnoses    Principal Problem:   Acute non-ST-segment elevation myocardial infarction [facility]) Active Problems:   Tobacco use disorder   Type 2 diabetes mellitus ([place])   Hyperlipidemia associated with type 2 diabetes mellitus (HCC)   Esophageal reflux   Essential tremor   Chest pain   Depression   Essential hypertension   Elevated troponin   Elevated d-dimer   COPD with acute exacerbation (HCC)   Coronary artery disease involving native coronary artery of native heart with unstable angina pectoris (HCC)   Allergies Allergies  Allergen Reactions  . Tramadol Shortness Of Breath    And dizziness  . Trulicity [Dulaglutide] Swelling    Diagnostic Studies/Procedures    Cath: 01/21/18   Ost Cx to Prox Cx lesion is 80% stenosed.  Balloon angioplasty was performed using a BALLOON SAPPHIRE 2.0X15. Post intervention, there is a 40% residual stenosis.  Dist Cx-1 lesion is 80% stenosed.  Balloon angioplasty was performed using a BALLOON SAPPHIRE 2.0X15. Post intervention, there is a 60% residual stenosis.  Dist Cx-2 lesion is 100% stenosed. Post intervention, there is a 100% residual stenosis. --Unable to cross the lesion with wire or balloon  ------------  Ramus lesion is 95% stenosed.  A drug-eluting stent was successfully placed using a STENT SYNERGY DES 2.25X12.  Post intervention, there is a 0% residual stenosis.  ---------------------------  Dist (apical) LAD lesion is 100% stenosed. Likely CTO  RPDA-1 lesion is 80% stenosed. RPDA-2 lesion is 70% stenosed.  Ostial Post Atrio-1 lesion is 80% stenosed. Post Atrio-2 lesion is 60% stenosed.  ----------------------------  The left ventricular systolic function is normal. The left  ventricular ejection fraction is 50-55% by visual estimate. LV end diastolic pressure is normal.    Severe multivessel disease, unclear what the culprit lesion is.  100% occlusion of the distal circumflex is not likely the culprit lesion as this was not able to be crossed with a wire.  The apical LAD is subtotally occluded and this could also be the source of his pain -not good PCI target  There is a 95% Ramus Intermedius lesion that was treated with DES stent (Synergy DES 2.25 mm x 12 mm postdilated 2.4 mm), without resolution of pain.  There is also ostial RPAV 80-85%.  LV gram suggests apical hypokinesis and perhaps some mild anterolateral hypokinesis as well.  Relatively normal LVEDP.  Plan: Admit to CCU for now for post PCI care.  With no obvious culprit lesion, will need to treat pain with analgesics --he has Dilaudid ordered from any pain which will be continued. Will run Aggrastat for 6 hours in case this may help reperfuse the distal LAD, otherwise no clear obvious PCI target to treat his pain at this point. Convert from pravastatin to atorvastatin; if blood pressure tolerates, start low-dose beta-blocker.  We will need to reevaluate his symptoms, and determine potential benefit from treating the lesions of the distal right system.  Otherwise no viable PCI target options remain.  Continue COPD medications  Recommend uninterrupted dual antiplatelet therapy with Aspirin 81mg  daily and Ticagrelor 90mg  twice daily for a minimum of But would be okay to stop aspirin after 3 to 6 months.   Glenetta Hew  TTE: 01/21/18  Study Conclusions  - Left ventricle: The cavity size  was normal. Wall thickness was   increased in a pattern of mild LVH. Systolic function was normal.   The estimated ejection fraction was in the range of 50% to 55%.   There is hypokinesis of the apicalinferior myocardium. There is   hypokinesis of the mid-apicalanterolateral myocardium. Left   ventricular  diastolic function parameters were normal for the   patient&'s age. - Aortic valve: Trileaflet; moderately thickened, moderately   calcified leaflets. - Mitral valve: Mildly calcified leaflets . There was trivial   regurgitation. - Right atrium: Central venous pressure (est): 15 mm Hg. - Atrial septum: No defect or patent foramen ovale was identified. - Tricuspid valve: There was trivial regurgitation. - Pulmonary arteries: Systolic pressure could not be accurately   estimated. - Pericardium, extracardiac: There was no pericardial effusion. _____________   History of Present Illness     Andrew [name] is a 67 y.o. male with a history of cardiac risk factors including hypertension, hyperlipidemia, diabetes mellitus, type II (on insulin), long-standing smoker with COPD, OSA no longer on BiPAP with also history of CVA  was in his normal state of health until the day prior to admission (01/20/2018) when he started having some chest pain beginning about noon.  This was associated with feeling his heart rate going fast and being short of breath.  He had some mild nausea and intermittent dizziness but no vomiting.  He had noted some coughing but no significant amount. -->  Baseline evaluation in the ER chest x-ray suggested emphysema with chronic bronchitic changes and mild interstitial lung markings but no obvious findings of pneumonia.  CTA of the chest did not show any evidence of PE or dissection.  He was started on treatment for possible COPD exacerbation with Solu-Medrol and bronchodilators. The pain was initially described sounding somewhat pleuritic in nature concerning for pericarditis. Plan was to rule him out.  He did have a mild troponin elevation and continued to have chest pain throughout the course of the night.  He was then reevaluated at roughly 6 AM in the morning when EKG showed concerning ST elevations.  He was somewhat hypertensive tachycardic and was given 5 mg Lopressor and aspirin.  The admitting physician contacted Dr. Domenic Polite (on-call for cardiology) the plan was to transfer to [facility] for cardiology evaluation. He was started on nitroglycerin drip and was also given Dilaudid with no change in chest pain.  When he was evaluated by EMS they noted his EKG showing again significant EKG changes that had somewhat resolved on intermittent EKGs.  Upon arrival to [facility] he was still having chest pain, and Dr. Ellyn Hack decided to take him to the Cath Lab for emergent catheterization.  Hospital Course     Underwent cardiac cath with Dr. Ellyn Hack 01/21/2018 revealing multivessel CAD with an occluded apical LAD, high-grade mid ramus branch stenosis which was stented x1, high-grade ostial/proximal circumflex which was angioplastied with an occluded distal circumflex and high-grade ostial PLA branch stenosis.  He was also noted to have an apical wall motion abnormality but preserved LV function.  It was unclear if he had "a culprit lesion" or whether his chest pain was related to COPD.  In any event, he was placed on dual antiplatelet therapy including aspirin and Brilinta which may be problematic given his COPD. Tolerated well while inpatient. Troponin peaked at 0.14. Also noted to have a long history tobacco abuse currently smoking 1 to 1/2 packs/day.  CT scan did show evidence edematous changes.  He was  wheezing when he was admitted currently, his lungs are clear.  He was placed on a steroid taper. Prior to admission was on multiple diabetic agents which were held prior to cath but resumed appropriately post cath. He was counseled on the need for smoking cessation. Statin was switched to Lipitor 80mg  daily. Given his COPD bisoprolol 2.5 mg daily was added with blood pressures tolerating. Worked well with cardiac rehab without recurrent chest pain. He was informed of the need to stop naprosyn given his need for DAPT prior to discharge. Of note he was given his new medications with 30 day  supply from the [facility] prior to discharge. CM check on Brilinta was 45$ for 90 days.   Andrew [name] was seen by Dr. Gwenlyn Found and determined stable for discharge home. Follow up in the office has been arranged. Medications are listed below.   _____________  Discharge Vitals Blood pressure 113/66, pulse 68, temperature 97.7 F (36.5 C), temperature source Oral, resp. rate 20, height 6' 2.25" (1.886 m), weight 90.3 kg, SpO2 95 %.  Filed Weights   01/20/18 1717 01/21/18 1355 01/24/18 0600  Weight: 93.9 kg 95.7 kg 90.3 kg    Labs & Radiologic Studies    CBC Recent Labs    01/23/18 0259  WBC 15.4*  HGB 15.6  HCT 47.3  MCV 96.1  PLT 546   Basic Metabolic Panel Recent Labs    01/23/18 0259 01/25/18 0326  NA 139 136  K 4.6 3.9  CL 104 103  CO2 27 23  GLUCOSE 223* 193*  BUN 25* 24*  CREATININE 1.03 0.91  CALCIUM 9.7 9.1   Liver Function Tests No results for input(s): AST, ALT, ALKPHOS, BILITOT, PROT, ALBUMIN in the last 72 hours. No results for input(s): LIPASE, AMYLASE in the last 72 hours. Cardiac Enzymes No results for input(s): CKTOTAL, CKMB, CKMBINDEX, TROPONINI in the last 72 hours. BNP Invalid input(s): POCBNP D-Dimer No results for input(s): DDIMER in the last 72 hours. Hemoglobin A1C Recent Labs    01/24/18 0958  HGBA1C 7.2*   Fasting Lipid Panel Recent Labs    01/23/18 0259  CHOL 132  HDL 47  LDLCALC 31  TRIG 272*  CHOLHDL 2.8   Thyroid Function Tests No results for input(s): TSH, T4TOTAL, T3FREE, THYROIDAB in the last 72 hours.  Invalid input(s): FREET3 _____________  Dg Chest 2 View  Result Date: 01/20/2018 CLINICAL DATA:  Mid chest pain beginning today. EXAM: CHEST - 2 VIEW COMPARISON:  03/28/2017 FINDINGS: Normal heart size with mild aortic atherosclerosis. No aneurysm. Chronic bronchitic change of the lungs without alveolar consolidation. Osteoarthritis of the [facility] and glenohumeral joints. Surgical anchors project over the right  humeral head. No effusion or pneumothorax. Stable mild degenerative change along the dorsal spine. IMPRESSION: Chronic bronchitic change with increased interstitial lung markings noted bilaterally. Mild aortic atherosclerosis without aneurysm. Electronically Signed   By: Ashley Royalty M.D.   On: 01/20/2018 17:49   Ct Angio Chest/abd/pel For Dissection W And/or W/wo  Result Date: 01/20/2018 CLINICAL DATA:  Per ED notes: Pt reports chest pain that began around 1300 this afternoon while sitting on couch. Pt reports pain is worse with movement and inspiration. Pt belching in triage. NAD noted. Airway patent. EXAM: CT ANGIOGRAPHY CHEST, ABDOMEN AND PELVIS TECHNIQUE: Multidetector CT imaging through the chest, abdomen and pelvis was performed using the standard protocol during bolus administration of intravenous contrast. Multiplanar reconstructed images and MIPs were obtained and reviewed to evaluate the vascular anatomy. CONTRAST:  167mL ISOVUE-370 IOPAMIDOL (ISOVUE-370) INJECTION 76% COMPARISON:  CT of the abdomen and pelvis on 01/14/2017 FINDINGS: CTA CHEST FINDINGS Cardiovascular: Heart size is normal. There is atherosclerotic calcification of the coronary arteries and thoracic aorta. No displaced intimal calcifications. With contrast administration there is no dissection. No pericardial effusion. Normal appearance of the pulmonary arteries accounting for the contrast bolus timing. Mediastinum/Nodes: The visualized portion of the thyroid gland has a normal appearance. Small mediastinal and hilar lymph nodes measure less than 1.5 centimeters in short axis and are likely reactive. Esophagus is normal in appearance. Lungs/Pleura: Emphysematous changes are identified at the lung apices. There are subpleural reticular changes primarily within the dependent portions of the lungs. No consolidations or pleural effusions. Musculoskeletal: No chest wall abnormality. No acute or significant osseous findings. Review of the MIP  images confirms the above findings. CTA ABDOMEN AND PELVIS FINDINGS VASCULAR Aorta: There is mild atherosclerotic calcification of the abdominal aorta. No aneurysm. Celiac: There is atherosclerotic calcification at the origin of the celiac axis, not associated with significant narrowing. SMA: Patent without evidence of aneurysm, dissection, vasculitis or significant stenosis. Renals: Single renal arteries bilaterally are patent. IMA: Patent without evidence of aneurysm, dissection, vasculitis or significant stenosis. Inflow: Patent without evidence of aneurysm, dissection, vasculitis or significant stenosis. Veins: No obvious venous abnormality within the limitations of this arterial phase study. Review of the MIP images confirms the above findings. NON-VASCULAR Hepatobiliary: No focal liver abnormality is seen. No radiopaque gallstones, biliary dilatation, or pericholecystic inflammatory changes. Pancreas: Unremarkable. No pancreatic ductal dilatation or surrounding inflammatory changes. Spleen: Numerous splenic granulomata are present. Adrenals/Urinary Tract: Adrenal glands are unremarkable. Kidneys are normal, without renal calculi, focal lesion, or hydronephrosis. Bladder is unremarkable. Stomach/Bowel: The stomach and small bowel loops are normal in appearance. Appendectomy. Large bowel is normal in appearance. Lymphatic: No significant vascular findings are present. No enlarged abdominal or pelvic lymph nodes. Reproductive: Prostate is unremarkable. Other: No abdominal wall hernia or abnormality. No abdominopelvic ascites. Review of the MIP images confirms the above findings. IMPRESSION: 1. No evidence for dissection or aneurysm. 2. Aortic atherosclerosis.  (ICD10-I70.0) 3. Coronary atherosclerosis. 4.  Emphysema (ICD10-J43.9). 5. Prior granulomatous disease. 6. Appendectomy.  No bowel obstruction. Electronically Signed   By: Nolon Nations M.D.   On: 01/20/2018 20:01   Disposition   Pt is being  discharged home today in good condition.  Follow-up Plans & Appointments    Follow-up Information    Ledora Bottcher, [place] Follow up on 02/02/2018.   Specialties:  Physician Assistant, Cardiology, Radiology Why:  at 11am for your follow up appt.  Contact information: [address] [place] [address] [phone]          Discharge Instructions    AMB Referral to Cardiac Rehabilitation - Phase II   Complete by:  As directed    Diagnosis:   STEMI Coronary Stents     Amb Referral to Cardiac Rehabilitation   Complete by:  As directed    Diagnosis:   STEMI Coronary Stents     Call MD for:  redness, tenderness, or signs of infection (pain, swelling, redness, odor or green/yellow discharge around incision site)   Complete by:  As directed    Diet - low sodium heart healthy   Complete by:  As directed    Discharge instructions   Complete by:  As directed    Radial Site Care Refer to this sheet in the next few weeks. These instructions provide you with  information on caring for yourself after your procedure. Your caregiver may also give you more specific instructions. Your treatment has been planned according to current medical practices, but problems sometimes occur. Call your caregiver if you have any problems or questions after your procedure. HOME CARE INSTRUCTIONS You may shower the day after the procedure.Remove the bandage (dressing) and gently wash the site with plain soap and water.Gently pat the site dry.  Do not apply powder or lotion to the site.  Do not submerge the affected site in water for 3 to 5 days.  Inspect the site at least twice daily.  Do not flex or bend the affected arm for 24 hours.  Do not drive home if you are discharged the same day of the procedure. Have someone else drive you.   What to expect: Any bruising will usually fade within 1 to 2 weeks.  Blood that collects in the tissue (hematoma) may be painful to the touch. It should  usually decrease in size and tenderness within 1 to 2 weeks.  SEEK IMMEDIATE MEDICAL CARE IF: You have unusual pain at the radial site.  You have redness, warmth, swelling, or pain at the radial site.  You have drainage (other than a small amount of blood on the dressing).  You have chills.  You have a fever or persistent symptoms for more than 72 hours.  You have a fever and your symptoms suddenly get worse.  Your arm becomes pale, cool, tingly, or numb.  You have heavy bleeding from the site. Hold pressure on the site.   PLEASE DO NOT MISS ANY DOSES OF YOUR BRILINTA!!!!! Also keep a log of you blood pressures and bring back to your follow up appt. Please call the office with any questions.   Patients taking blood thinners should generally stay away from medicines like ibuprofen, Advil, Motrin, naproxen, and Aleve due to risk of stomach bleeding. You may take Tylenol as directed or talk to your primary doctor about alternatives.  No driving for 2 weeks. No lifting over 10 lbs for 4 weeks. No sexual activity for 4 weeks. You may not return to work until cleared by your cardiologist. Keep procedure site clean & dry. If you notice increased pain, swelling, bleeding or pus, call/return!  You may shower, but no soaking baths/hot tubs/pools for 1 week.   Increase activity slowly   Complete by:  As directed        Discharge Medications     Medication List    STOP taking these medications   furosemide 20 MG tablet Commonly known as:  LASIX   naproxen sodium 220 MG tablet Commonly known as:  ALEVE   pravastatin 80 MG tablet Commonly known as:  PRAVACHOL     TAKE these medications   aspirin 81 MG tablet Take 81 mg by mouth daily.   atorvastatin 80 MG tablet Commonly known as:  LIPITOR Take 1 tablet (80 mg total) by mouth daily at 6 PM.   bisoprolol 5 MG tablet Commonly known as:  ZEBETA Take 0.5 tablets (2.5 mg total) by mouth daily. Start taking on:  01/26/2018     budesonide-formoterol 80-4.5 MCG/ACT inhaler Commonly known as:  SYMBICORT Inhale 2 puffs into the lungs 2 (two) times daily.   buPROPion 150 MG 12 hr tablet Commonly known as:  WELLBUTRIN SR Take 1 tablet (150 mg total) by mouth 2 (two) times daily.   dapagliflozin propanediol 10 MG Tabs tablet Commonly known as:  FARXIGA Take 10  mg by mouth daily.   glimepiride 4 MG tablet Commonly known as:  AMARYL Take 4 mg by mouth 2 (two) times daily.   lisinopril 10 MG tablet Commonly known as:  PRINIVIL,ZESTRIL Take 1 tablet (10 mg total) by mouth daily.   metFORMIN 1000 MG tablet Commonly known as:  GLUCOPHAGE Take 1 tablet (1,000 mg total) by mouth 2 (two) times daily with a meal.   nitroGLYCERIN 0.4 MG SL tablet Commonly known as:  NITROSTAT Place 1 tablet (0.4 mg total) under the tongue every 5 (five) minutes as needed for chest pain.   omeprazole 20 MG capsule Commonly known as:  PRILOSEC TAKE (1) CAPSULE DAILY   predniSONE 10 MG tablet Commonly known as:  DELTASONE Take 30mg  tomorrow, then 20mg  for 2 days, then 10mg  for 2 days.   primidone 50 MG tablet Commonly known as:  MYSOLINE Take 1 tablet (50 mg total) by mouth 4 (four) times daily.   sitaGLIPtin 100 MG tablet Commonly known as:  JANUVIA Take 1 tablet (100 mg total) by mouth daily.   ticagrelor 90 MG Tabs tablet Commonly known as:  BRILINTA Take 1 tablet (90 mg total) by mouth 2 (two) times daily.        Acute coronary syndrome (MI, NSTEMI, STEMI, etc) this admission?: Yes.     AHA/ACC Clinical Performance & Quality Measures: 1. Aspirin prescribed? - Yes 2. ADP Receptor Inhibitor (Plavix/Clopidogrel, Brilinta/Ticagrelor or Effient/Prasugrel) prescribed (includes medically managed patients)? - Yes 3. Beta Blocker prescribed? - Yes 4. High Intensity Statin (Lipitor 40-80mg  or Crestor 20-40mg ) prescribed? - Yes 5. EF assessed during THIS hospitalization? - Yes 6. For EF <40%, was ACEI/ARB prescribed? -  Yes 7. For EF <40%, Aldosterone Antagonist (Spironolactone or Eplerenone) prescribed? - Not Applicable (EF >/= 76%) 8. Cardiac Rehab Phase II ordered (Included Medically managed Patients)? - Yes      Outstanding Labs/Studies   FLP/LFTs in 6 weeks, and BMET at follow up appt.   Duration of Discharge Encounter   Greater than 30 minutes including physician time.  Signed, Reino Bellis NP-C 01/25/2018, 11:13 AM  Agree with note by Reino Bellis NP-C  Patient admitted on 01/20/2018 with respiratory distress and non-STEMI.  He underwent cardiac cath by Dr. Ellyn Hack with PTA of circumflex and stenting of a ramus branch.  He is on dual antiplatelet therapy.  His pulmonary status continued to improve on oral steroids with taper dosing and inhaled bronchodilators.  His exam is benign.  He can be discharged home at this morning and follow-up with Dr. Ellyn Hack as an outpatient.   Lorretta Harp, M.D., [place], [facility], [place] [address] [place], [place]  [address]  [phone] 01/25/2018 12:38 PM

## 2018-01-25 NOTE — Care Management Note (Signed)
Case Management Note  Patient Details  Name: [name] MRN: [id number] Date of Birth: 21-Jun-1950  Subjective/Objective:  Pt presented for Acute Nstemi- post cath. Plan for home on Brilinta. CM did provide patient with 30 day free card.  Patient is aware to contact the [place] on the back of [place]. PTA Independent  from home. CM did call [place] is available.                    Action/Plan: No home needs identified at this time.   Expected Discharge Date:                  Expected Discharge Plan:  Home/Self Care  In-House Referral:  NA  Discharge planning Services  CM Consult, Medication Assistance  Post Acute Care Choice:  NA Choice offered to:  NA  DME Arranged:  N/A DME Agency:  NA  HH Arranged:  NA HH Agency:  NA  Status of Service:  Completed, signed off  If discussed at [place] of Stay Meetings, dates discussed:    Additional Comments:  Bethena Roys, RN 01/25/2018, 11:08 AM

## 2018-01-25 NOTE — Plan of Care (Signed)
  Problem: Activity: Goal: Ability to tolerate increased activity will improve Outcome: Completed/Met  Patient ambulated entire length of unit x2 with standby assist only. Pt tolerated very well with no complaints of CP/SOB/Dizziness. Gait steady.

## 2018-01-25 NOTE — Progress Notes (Signed)
Pharmacy note: home medications  Will provide patient with 30 days of ticagrelor 90mg  po bid, atorvastatin 80mg  po daily and prednisone taper (30mg  po daily x1 then 20mg  po daily x1 days then prednisone 10mg  po daily x2)  -The 30 days card was used for Brilinta and the co-pay thereafter will be $47 per month  Thank  You, Hildred Laser, PharmD Clinical Pharmacist Please check Amion for pharmacy contact number

## 2018-01-25 NOTE — Progress Notes (Signed)
   01/25/18 1100   PT - Assessment/Plan  Follow Up Recommendations Home health PT;Supervision/Assistance - 24 hour  PT equipment None recommended by PT  [place] Pager:  [phone]  Office:  [phone]

## 2018-01-25 NOTE — Progress Notes (Signed)
Physical Therapy Treatment Patient Details Name: Andrew [name] MRN: [id number] DOB: [birth year] Today's Date: 01/25/2018    History of Present Illness 67 yo admitted as STEMI s/p cath with PCI 8/31. PMHx:COPD, CAD, DM, OSA    PT Comments    Pt admitted with above diagnosis. Pt currently with functional limitations due to balance and endurance deficits. Pt was able to ambulate in hallways with LOB with challenges.  Recommended to pt to use device and pt agrees to use cane at home but declines RW.  Recommend HHPT to further balance training.   Pt will benefit from skilled PT to increase their independence and safety with mobility to allow discharge to the venue listed below.     Follow Up Recommendations  Home health PT;Supervision/Assistance - 24 hour     Equipment Recommendations  None recommended by PT(refuses RW)    Recommendations for Other Services       Precautions / Restrictions Precautions Precautions: Fall Restrictions Weight Bearing Restrictions: No    Mobility  Bed Mobility Overal bed mobility: Independent             General bed mobility comments: supine to sit  Transfers Overall transfer level: Needs assistance Equipment used: None Transfers: Sit to/from Stand Sit to Stand: Supervision         General transfer comment: Supervision for basic sit<>stand transfers.  Ambulation/Gait Ambulation/Gait assistance: Supervision;Min guard Gait Distance (Feet): 900 Feet Assistive device: None Gait Pattern/deviations: Decreased stride length;Decreased stance time - right;Decreased step length - right;Decreased weight shift to right;Drifts right/left;Staggering left Gait velocity: decreased  Gait velocity interpretation: >4.37 ft/sec, indicative of normal walking speed General Gait Details: Pt ambulates without device with occasional lateral sway without LOB if not challenged. With challenges, pt needs min guard assist as he did lose balance a few times with  challenges needing steadying assist.     Stairs             Wheelchair Mobility    Modified Rankin (Stroke Patients Only)       Balance   Sitting-balance support: Feet supported;No upper extremity supported Sitting balance-Leahy Scale: Good     Standing balance support: No upper extremity supported;During functional activity Standing balance-Leahy Scale: Fair Standing balance comment: lateral sway at times with gait                 Standardized Balance Assessment Standardized Balance Assessment : Dynamic Gait Index   Dynamic Gait Index Level Surface: Mild Impairment Change in Gait Speed: Mild Impairment Gait with Horizontal Head Turns: Moderate Impairment Gait with Vertical Head Turns: Mild Impairment Gait and Pivot Turn: Mild Impairment Step Over Obstacle: Normal Step Around Obstacles: Normal Steps: Moderate Impairment Total Score: 16      Cognition Arousal/Alertness: Awake/alert Behavior During Therapy: WFL for tasks assessed/performed Overall Cognitive Status: Within Functional Limits for tasks assessed                                        Exercises General Exercises - Lower Extremity Long Arc Quad: AROM;10 reps;Left;Right;Seated    General Comments General comments (skin integrity, edema, etc.): Pt scored 16/24 on DGI suggesting pt at risk of falls.  Discussed with pt that he should use device and he states he will use cane at home but due to his dogs he can't use RW because it would be worse with his dogs.  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 : None Difficulty sitting down on and standing up from a chair with arms (e.g., wheelchair, bedside commode, etc,.)?: None Help needed moving to and from a bed to chair (including a wheelchair)?: None Help needed walking in hospital room?: A Little Help needed climbing 3-5 steps with a railing? : A Little 6 Click Score: 22    End of Session Equipment Utilized During Treatment: Gait belt Activity Tolerance: Patient tolerated treatment well Patient left: in chair;with call bell/phone within reach Nurse Communication: Mobility status PT Visit Diagnosis: Other abnormalities of gait and mobility (R26.89)     Time: 9381-0175 PT Time Calculation (min) (ACUTE ONLY): 11 min  Charges:  $Gait Training: 8-22 mins                     [place]is Pager:  [phone]  Office:  [place] 01/25/2018, 12:08 PM

## 2018-01-25 NOTE — Progress Notes (Signed)
Pt ambulated the entire length of department without difficulty. No CP/SOB or dizziness. Jessie Foot, RN

## 2018-01-26 ENCOUNTER — Other Ambulatory Visit: Payer: Self-pay

## 2018-01-27 ENCOUNTER — Other Ambulatory Visit: Payer: Self-pay

## 2018-01-27 ENCOUNTER — Encounter (HOSPITAL_COMMUNITY): Payer: Self-pay | Admitting: Cardiology

## 2018-01-27 NOTE — Patient Outreach (Signed)
[place] [facility]) Care Management  01/27/2018  Darald Uzzle Jun 15, 1950 [id number]   Referral received from [facility] for complex case management and assessment for home visits. RN CM contacted Mr. Kijowski for initial outreach. Member was agreeable to initial home visit on 02/07/18.   PLAN RN CM will follow up as scheduled for home visit.   [place] [phone]

## 2018-01-31 ENCOUNTER — Other Ambulatory Visit: Payer: Self-pay | Admitting: *Deleted

## 2018-01-31 NOTE — Patient Outreach (Signed)
Transition of care call. New care manager assigned for geographic area. I talked with Andrew [name] today and completed his transition of care template. He reports he does not have ZEBETA which was prescribed while in the hospital. All other meds on discharge list he acknowledges having and taking as ordered.  He has a follow up with cardiology but none for his primary care provider.  He denies pain, SOB, edema, nausea.  Patient was recently discharged from hospital and all medications have been reviewed. Outpatient Encounter Medications as of 01/31/2018  Medication Sig Note  . aspirin 81 MG tablet Take 81 mg by mouth daily.   Marland Kitchen atorvastatin (LIPITOR) 80 MG tablet Take 1 tablet (80 mg total) by mouth daily at 6 PM.   . budesonide-formoterol (SYMBICORT) 80-4.5 MCG/ACT inhaler Inhale 2 puffs into the lungs 2 (two) times daily.   Marland Kitchen buPROPion (WELLBUTRIN SR) 150 MG 12 hr tablet Take 1 tablet (150 mg total) by mouth 2 (two) times daily.   . dapagliflozin propanediol (FARXIGA) 10 MG TABS tablet Take 10 mg by mouth daily. 01/21/2018: Patient currently using samples as insurance will not cover medication  . lisinopril (PRINIVIL,ZESTRIL) 10 MG tablet Take 1 tablet (10 mg total) by mouth daily.   . metFORMIN (GLUCOPHAGE) 1000 MG tablet Take 1 tablet (1,000 mg total) by mouth 2 (two) times daily with a meal.   . nitroGLYCERIN (NITROSTAT) 0.4 MG SL tablet Place 1 tablet (0.4 mg total) under the tongue every 5 (five) minutes as needed for chest pain.   Marland Kitchen omeprazole (PRILOSEC) 20 MG capsule TAKE (1) CAPSULE DAILY   . primidone (MYSOLINE) 50 MG tablet Take 1 tablet (50 mg total) by mouth 4 (four) times daily.   . sitaGLIPtin (JANUVIA) 100 MG tablet Take 1 tablet (100 mg total) by mouth daily. 01/21/2018: Patient currently using samples as insurance will not cover medication  . ticagrelor (BRILINTA) 90 MG TABS tablet Take 1 tablet (90 mg total) by mouth 2 (two) times daily.   . bisoprolol (ZEBETA) 5 MG tablet Take  0.5 tablets (2.5 mg total) by mouth daily. (Patient not taking: Reported on 01/31/2018)   . glimepiride (AMARYL) 4 MG tablet Take 4 mg by mouth 2 (two) times daily.   . [DISCONTINUED] predniSONE (DELTASONE) 10 MG tablet Take 30mg  tomorrow, then 20mg  for 2 days, then 10mg  for 2 days.   . [DISCONTINUED] 0.9 %  sodium chloride infusion     No facility-administered encounter medications on file as of 01/31/2018.    I will make another call on 9/12 and see him on 9/17.  I will call cardiology and pharmacy about the Zebeta.  Andrew [name]. Myrtie Neither, MSN, [facility] Gerontological Nurse Practitioner [facility] Care Management [phone]

## 2018-02-01 NOTE — Progress Notes (Signed)
Cardiology Office Note:    Date:  02/02/2018   ID:  Andrew [name], DOB 07-30-1950, MRN [id number]  PCP:  Eustaquio Maize, MD  Cardiologist:  Dr. Ellyn Hack  Referring MD: Eustaquio Maize, MD   Chief Complaint  Patient presents with  . Hospitalization Follow-up    STEMI    History of Present Illness:    Andrew [name] is a 67 y.o. male with a hx of hypertension, hyperlipidemia, diabetes type 2 on insulin, long-standing smoker with COPD, OSA no longer on BiPAP with a history of CVA.  He presented on 01/20/2018 with complaints of chest pain.  Decision was made to rule him out overnight, however upon reexamination at 6 AM the next morning he was found to have ST elevations.  He was transferred to [facility] for emergent heart catheterization.  He underwent heart cath on 01/21/2018 revealing multivessel CAD with 80% ostial to proximal circumflex lesion treated with balloon angioplasty, distal circumflex lesion of 80% stenosis treated with balloon angioplasty 100% stenosed distal circumflex unable to cross lesion with the wire or balloon, 95% ramus lesion treated with DES, CTO of the distal LAD not amenable to PCI.  Culprit lesion was unclear.  He was discharged on dual antiplatelet therapy with aspirin and Brilinta.  Surprisingly, he tolerated Brilinta while inpatient with his COPD.  He returns for hospital follow-up. He is here with his daughter. He has been having intermittent chest pain but is ignoring the pain. He can't tell me frequency or duration. We discussed SL nitro, which he doesn't want to take because he heard it causes headaches. We also discussed long-acting nitrates. He does not want to add any medications to his regimen at this time. He quit smoking the day of his heart attack - I congratulated him on his success. He has no bleeding problems. He asks about activity restrictions.    Past Medical History:  Diagnosis Date  . Allergy   . Anemia   . Arthritis   . Cancer [facility])      Bladder  . Depression   . Diabetes mellitus without complication ([place])   . GERD (gastroesophageal reflux disease)   . Hypercholesteremia   . Hyperlipidemia   . Hypertension    in the past no longer on medication  . Neuromuscular disorder ([place])   . Sleep apnea    BiPAP no used in 3 years  . Status post tendon repair 1989  . STEMI (ST elevation myocardial infarction) ([place])    01/21/18 severe multivessel disease with unclear lesion, but opted for PCI/DES x1 to the RI.   Marland Kitchen Stroke ([place])    At times pt has dizziness with loss of vision  . Tremors of nervous system 2011    Past Surgical History:  Procedure Laterality Date  . APPENDECTOMY    . BLADDER SURGERY    . COLONOSCOPY    . CORONARY STENT INTERVENTION N/A 01/21/2018   Procedure: CORONARY STENT INTERVENTION;  Surgeon: Leonie Man, MD;  Location: [place] CV LAB;  Service: Cardiovascular;  Laterality: N/A;  mid ramus  . CORONARY/GRAFT ACUTE MI REVASCULARIZATION N/A 01/21/2018   Procedure: Coronary/Graft Acute MI Revascularization;  Surgeon: Leonie Man, MD;  Location: [place] CV LAB;  Service: Cardiovascular;  Laterality: N/A;  attempted revas to distal CFX  . CYSTOSCOPY W/ RETROGRADES Bilateral 08/22/2015   Procedure: CYSTOSCOPY WITH RETROGRADE PYELOGRAM;  Surgeon: Irine Seal, MD;  Location: [facility];  Service: Urology;  Laterality: Bilateral;  . CYSTOSCOPY W/  RETROGRADES Bilateral 01/16/2016   Procedure: CYSTOSCOPY WITH RETROGRADE PYELOGRAM;  Surgeon: Irine Seal, MD;  Location: [facility];  Service: Urology;  Laterality: Bilateral;  . CYSTOSCOPY W/ RETROGRADES Bilateral 01/07/2017   Procedure: CYSTOSCOPY WITH RETROGRADE PYELOGRAM;  Surgeon: Irine Seal, MD;  Location: [facility];  Service: Urology;  Laterality: Bilateral;  . CYSTOSCOPY WITH BIOPSY N/A 08/22/2015   Procedure: CYSTOSCOPY WITH BLADDER BIOPSY;  Surgeon: Irine Seal, MD;  Location: [facility];  Service: Urology;  Laterality: N/A;  . CYSTOSCOPY WITH BIOPSY N/A 01/16/2016    Procedure: CYSTOSCOPY WITH BLADDER BIOPSY;  Surgeon: Irine Seal, MD;  Location: [facility];  Service: Urology;  Laterality: N/A;  . CYSTOSCOPY WITH FULGERATION N/A 01/16/2016   Procedure: CYSTOSCOPY WITH FULGERATION;  Surgeon: Irine Seal, MD;  Location: [facility];  Service: Urology;  Laterality: N/A;  . KNEE ARTHROSCOPY Right 1989  . LEFT HEART CATH AND CORONARY ANGIOGRAPHY N/A 01/21/2018   Procedure: LEFT HEART CATH AND CORONARY ANGIOGRAPHY;  Surgeon: Leonie Man, MD;  Location: [place] CV LAB;  Service: Cardiovascular;  Laterality: N/A;  . ROTATOR CUFF REPAIR Bilateral 2000  . rt. leg fracture surgery    . TRANSURETHRAL RESECTION OF BLADDER TUMOR N/A 01/07/2017   Procedure: TRANSURETHRAL RESECTION OF BLADDER TUMOR (TURBT);  Surgeon: Irine Seal, MD;  Location: [facility];  Service: Urology;  Laterality: N/A;  . WISDOM TOOTH EXTRACTION      Current Medications: Current Meds  Medication Sig  . aspirin 81 MG tablet Take 81 mg by mouth daily.  Marland Kitchen atorvastatin (LIPITOR) 80 MG tablet Take 1 tablet (80 mg total) by mouth daily at 6 PM.  . bisoprolol (ZEBETA) 5 MG tablet Take 0.5 tablets (2.5 mg total) by mouth daily.  . budesonide-formoterol (SYMBICORT) 80-4.5 MCG/ACT inhaler Inhale 2 puffs into the lungs 2 (two) times daily.  Marland Kitchen buPROPion (WELLBUTRIN SR) 150 MG 12 hr tablet Take 1 tablet (150 mg total) by mouth 2 (two) times daily.  . dapagliflozin propanediol (FARXIGA) 10 MG TABS tablet Take 10 mg by mouth daily.  Marland Kitchen lisinopril (PRINIVIL,ZESTRIL) 10 MG tablet Take 1 tablet (10 mg total) by mouth daily.  . metFORMIN (GLUCOPHAGE) 1000 MG tablet Take 1 tablet (1,000 mg total) by mouth 2 (two) times daily with a meal.  . nitroGLYCERIN (NITROSTAT) 0.4 MG SL tablet Place 1 tablet (0.4 mg total) under the tongue every 5 (five) minutes as needed for chest pain.  Marland Kitchen omeprazole (PRILOSEC) 20 MG capsule TAKE (1) CAPSULE DAILY  . primidone (MYSOLINE) 50 MG tablet Take 1 tablet (50 mg total) by mouth 4 (four) times  daily.  . sitaGLIPtin (JANUVIA) 100 MG tablet Take 1 tablet (100 mg total) by mouth daily.  . ticagrelor (BRILINTA) 90 MG TABS tablet Take 1 tablet (90 mg total) by mouth 2 (two) times daily.     Allergies:   Tramadol and Trulicity [dulaglutide]   Social History   Socioeconomic History  . Marital status: Single    Spouse name: Not on file  . Number of children: 1  . Years of education: some college  . Highest education level: Some college, no degree  Occupational History  . Occupation: retired    Comment: Architect, some farming  Social Needs  . Financial resource strain: Not hard at all  . Food insecurity:    Worry: Never true    Inability: Never true  . Transportation needs:    Medical: No    Non-medical: No  Tobacco Use  . Smoking status: Former Smoker  Packs/day: 1.00    Years: 42.00    Pack years: 42.00    Types: Cigarettes    Last attempt to quit: 12/22/2017    Years since quitting: 0.1  . Smokeless tobacco: Never Used  Substance and Sexual Activity  . Alcohol use: No    Comment: rarely  . Drug use: No  . Sexual activity: Never  Lifestyle  . Physical activity:    Days per week: 5 days    Minutes per session: 90 min  . Stress: Not on file  Relationships  . Social connections:    Talks on phone: Not on file    Gets together: Not on file    Attends religious service: Not on file    Active member of club or organization: Not on file    Attends meetings of clubs or organizations: Not on file    Relationship status: Not on file  Other Topics Concern  . Not on file  Social History Narrative  . Not on file     Family History: The patient's family history includes Cancer in his brother; Dementia in his father; Diabetes in his brother and brother; Diabetes (age of onset: 43) in his mother; Heart attack (age of onset: 73) in his brother; Stroke in his mother; Testicular cancer in his brother. There is no history of Colon cancer, Colon polyps, Esophageal  cancer, Rectal cancer, or Stomach cancer.  ROS:   Please see the history of present illness.    All other systems reviewed and are negative.  EKGs/Labs/Other Studies Reviewed:    The following studies were reviewed today:  Cath: 01/21/18   Ost Cx to Prox Cx lesion is 80% stenosed.  Balloon angioplasty was performed using a BALLOON SAPPHIRE 2.0X15. Post intervention, there is a 40% residual stenosis.  Dist Cx-1 lesion is 80% stenosed.  Balloon angioplasty was performed using a BALLOON SAPPHIRE 2.0X15. Post intervention, there is a 60% residual stenosis.  Dist Cx-2 lesion is 100% stenosed. Post intervention, there is a 100% residual stenosis. --Unable to cross the lesion with wire or balloon  ------------  Ramus lesion is 95% stenosed.  A drug-eluting stent was successfully placed using a STENT SYNERGY DES 2.25X12.  Post intervention, there is a 0% residual stenosis.  ---------------------------  Dist (apical) LAD lesion is 100% stenosed. Likely CTO  RPDA-1 lesion is 80% stenosed. RPDA-2 lesion is 70% stenosed.  Ostial Post Atrio-1 lesion is 80% stenosed. Post Atrio-2 lesion is 60% stenosed.  ----------------------------  The left ventricular systolic function is normal. The left ventricular ejection fraction is 50-55% by visual estimate. LV end diastolic pressure is normal.   Severe multivessel disease, unclear what the culprit lesion is.  100% occlusion of the distal circumflex is not likely the culprit lesion as this was not able to be crossed with a wire.  The apical LAD is subtotally occluded and this could also be the source of his pain -not good PCI target  There is a 95% Ramus Intermedius lesion that was treated with DES stent (Synergy DES 2.25 mm x 12 mm postdilated 2.4 mm), without resolution of pain.  There is also ostial RPAV 80-85%.  LV gram suggests apical hypokinesis and perhaps some mild anterolateral hypokinesis as well.  Relatively normal  LVEDP.  Plan: Admit to CCU for now for post PCI care.With no obvious culprit lesion, will need to treat pain with analgesics --he has Dilaudid ordered from any pain which will be continued. Will run Aggrastat for 6 hours in  case this may help reperfuse the distal LAD, otherwise no clear obvious PCI target to treat his pain at this point. Convert from pravastatin to atorvastatin; if blood pressure tolerates, start low-dose beta-blocker.  We will need to reevaluate his symptoms, and determine potential benefit from treating the lesions of the distal right system. Otherwise no viable PCI target options remain.  Continue COPD medications  Recommend uninterrupted dual antiplatelet therapy with Aspirin 81mg  daily and Ticagrelor 90mg  twice dailyfor a minimum of But would be okay to stop aspirin after 3 to 6 months.  Glenetta Hew   TTE: 01/21/18  Study Conclusions - Left ventricle: The cavity size was normal. Wall thickness was increased in a pattern of mild LVH. Systolic function was normal. The estimated ejection fraction was in the range of 50% to 55%. There is hypokinesis of the apicalinferior myocardium. There is hypokinesis of the mid-apicalanterolateral myocardium. Left ventricular diastolic function parameters were normal for the patient&'s age. - Aortic valve: Trileaflet; moderately thickened, moderately calcified leaflets. - Mitral valve: Mildly calcified leaflets . There was trivial regurgitation. - Right atrium: Central venous pressure (est): 15 mm Hg. - Atrial septum: No defect or patent foramen ovale was identified. - Tricuspid valve: There was trivial regurgitation. - Pulmonary arteries: Systolic pressure could not be accurately estimated. - Pericardium, extracardiac: There was no pericardial effusion.  EKG:  EKG is ordered today.  The ekg ordered today demonstrates sinus  Recent Labs: 01/20/2018: Magnesium 1.8 01/21/2018: ALT 17 01/23/2018:  Hemoglobin 15.6; Platelets 201 01/25/2018: BUN 24; Creatinine, Ser 0.91; Potassium 3.9; Sodium 136  Recent Lipid Panel    Component Value Date/Time   CHOL 132 01/23/2018 0259   CHOL 161 12/19/2015 1025   TRIG 272 (H) 01/23/2018 0259   HDL 47 01/23/2018 0259   HDL 42 12/19/2015 1025   CHOLHDL 2.8 01/23/2018 0259   VLDL 54 (H) 01/23/2018 0259   LDLCALC 31 01/23/2018 0259   LDLCALC 70 12/19/2015 1025    Physical Exam:    VS:  BP 124/68   Pulse 71   Ht 6\' 4"  (1.93 m)   Wt 197 lb 12.8 oz (89.7 kg)   BMI 24.08 kg/m     Wt Readings from Last 3 Encounters:  02/02/18 197 lb 12.8 oz (89.7 kg)  01/24/18 199 lb (90.3 kg)  01/06/18 207 lb 9.6 oz (94.2 kg)     GEN: Well nourished, well developed in no acute distress HEENT: Normal NECK: No JVD no carotid bruits CARDIAC: RRR, no murmurs, rubs, gallops RESPIRATORY:  Coarse sounds throughout consistent with COPD ABDOMEN: Soft, non-tender, non-distended MUSCULOSKELETAL:  No edema; No deformity  SKIN: Warm and dry NEUROLOGIC:  Alert and oriented x 3 PSYCHIATRIC:  Normal affect   ASSESSMENT:    1. Coronary artery disease involving native coronary artery of native heart with unstable angina pectoris ([place])   2. ST elevation myocardial infarction (STEMI), unspecified artery ([place])   3. Essential hypertension   4. COPD with acute exacerbation ([place])   5. Type 2 diabetes mellitus with other circulatory complication, without long-term current use of insulin ([place])   6. Hyperlipidemia associated with type 2 diabetes mellitus ([place])   7. Tobacco use disorder    PLAN:    In order of problems listed above:  Coronary artery disease involving native coronary artery of native heart with unstable angina pectoris (HCC) ST elevation myocardial infarction (STEMI), unspecified artery ([place]) He is doing well on aspirin and Brilinta.  No bleeding problems.  He describes intermittent chest  pain, but cannot tell me about frequency or duration.  We discussed  the use of sublingual nitro and long-acting nitrates.  He does not want to take either one.  I asked him to keep [place] informed if the chest pain starts becoming daily or interfering in his daily activities. I released him for activities without weight restriction, but advised him to not use a chainsaw for another 2 weeks.  He may also try.  We discussed cardiac rehab and he will initiate a walking program.  Essential hypertension Pressures well controlled today no medication changes.  COPD with acute exacerbation ([place]) Tobacco use disorder He quit smoking on the day of his heart attack.  I congratulated him on his success.  Type 2 diabetes mellitus with other circulatory complication, without long-term current use of insulin ([place]) Per primary  Hyperlipidemia associated with type 2 diabetes mellitus ([place]) Triglycerides remain elevated on last lipid profile.  I will add TriCor today.  Repeat lipids in 6 weeks.  We discussed dietary alterations.  Will sounds as though he is doing well on his diet and avoiding high salt foods.   He would like to follow-up here in [place].  We will schedule an appointment in 3 months with Dr. Ellyn Hack.   Medication Adjustments/Labs and Tests Ordered: Current medicines are reviewed at length with the patient today.  Concerns regarding medicines are outlined above.  Orders Placed This Encounter  Procedures  . Lipid panel  . EKG 12-Lead   Meds ordered this encounter  Medications  . DISCONTD: fenofibrate (TRICOR) 48 MG tablet    Sig: Take 1 tablet (48 mg total) by mouth daily.    Dispense:  30 tablet    Refill:  5  . fenofibrate (TRICOR) 48 MG tablet    Sig: Take 1 tablet (48 mg total) by mouth daily.    Dispense:  30 tablet    Refill:  5    Discard previous rx    Signed, Ledora Bottcher, [place]  02/02/2018 11:58 AM    [facility]

## 2018-02-02 ENCOUNTER — Encounter: Payer: Self-pay | Admitting: Physician Assistant

## 2018-02-02 ENCOUNTER — Ambulatory Visit (INDEPENDENT_AMBULATORY_CARE_PROVIDER_SITE_OTHER): Payer: Medicare Other | Admitting: Physician Assistant

## 2018-02-02 ENCOUNTER — Ambulatory Visit: Payer: Medicare Other

## 2018-02-02 VITALS — BP 124/68 | HR 71 | Ht 76.0 in | Wt 197.8 lb

## 2018-02-02 DIAGNOSIS — E785 Hyperlipidemia, unspecified: Secondary | ICD-10-CM

## 2018-02-02 DIAGNOSIS — F17200 Nicotine dependence, unspecified, uncomplicated: Secondary | ICD-10-CM

## 2018-02-02 DIAGNOSIS — E1159 Type 2 diabetes mellitus with other circulatory complications: Secondary | ICD-10-CM

## 2018-02-02 DIAGNOSIS — I25110 Atherosclerotic heart disease of native coronary artery with unstable angina pectoris: Secondary | ICD-10-CM

## 2018-02-02 DIAGNOSIS — I10 Essential (primary) hypertension: Secondary | ICD-10-CM

## 2018-02-02 DIAGNOSIS — J441 Chronic obstructive pulmonary disease with (acute) exacerbation: Secondary | ICD-10-CM

## 2018-02-02 DIAGNOSIS — I213 ST elevation (STEMI) myocardial infarction of unspecified site: Secondary | ICD-10-CM | POA: Diagnosis not present

## 2018-02-02 DIAGNOSIS — E1169 Type 2 diabetes mellitus with other specified complication: Secondary | ICD-10-CM | POA: Diagnosis not present

## 2018-02-02 MED ORDER — FENOFIBRATE 48 MG PO TABS: 48.0000 mg | ORAL_TABLET | Freq: Every day | ORAL | 5 refills | Status: DC

## 2018-02-02 NOTE — Patient Instructions (Addendum)
Medication Instructions:  START Fenofibrate 48mg  take 1 tablet once a day   If you need a refill on your cardiac medications before your next appointment, please call your pharmacy.  Labwork: Your physician recommends that you return for lab work in: 6 weeks [place] in our office  Testing/Procedures: None   Follow-Up: Your physician recommends that you schedule a follow-up appointment in: 3 months with Dr Ellyn Hack.  Any Other Special Instructions Will Be Listed Below (If Applicable).

## 2018-02-07 ENCOUNTER — Ambulatory Visit: Payer: Self-pay | Admitting: *Deleted

## 2018-02-07 ENCOUNTER — Ambulatory Visit: Payer: Medicare Other

## 2018-02-10 ENCOUNTER — Other Ambulatory Visit: Payer: Self-pay | Admitting: *Deleted

## 2018-02-10 ENCOUNTER — Ambulatory Visit: Payer: Medicare Other | Admitting: *Deleted

## 2018-02-10 NOTE — Patient Outreach (Signed)
[facility] [facility]) Care Management   02/10/2018  Andrew [name] Jul 31, 1950 [id number]  Derelle Cockrell is an 67 y.o. male  Subjective: Mr. Geisen had a NSTEMI on 01/20/18 and underwent a cardiac cath with 2 angioplasties on 01/21/18. He discharged from [facility] on 01/25/18. Today he is chest pain free. He complains of weakness, fatigue and lack of endurance.  Most worrisome is his medication costs which he says he needs assistance to continue or he won't do it.   He also will need some coaching on his diet restrictions which it seems we will have to compromise with him to some extent to get him to move towards an improved life style.  Objective:   Review of Systems  Constitutional: Negative.   HENT: Negative.   Eyes: Negative.   Respiratory: Negative.   Cardiovascular:       Post STEMI.  Gastrointestinal: Negative.   Genitourinary: Negative.   Musculoskeletal: Positive for joint pain.  Skin: Negative.   Neurological: Positive for weakness.  Endo/Heme/Allergies: Bruises/bleeds easily.  Psychiatric/Behavioral: Positive for depression and memory loss.   BP 122/64   Pulse 89   Resp 18   Ht 1.905 m (6\' 3" )   Wt 191 lb (86.6 kg)   SpO2 98%   BMI 23.87 kg/m   Physical Exam  Constitutional: He is oriented to person, place, and time. He appears well-developed and well-nourished.  HENT:  Head: Normocephalic.  Cardiovascular: Normal rate and regular rhythm.  Respiratory: Effort normal and breath sounds normal.  GI: Soft. Bowel sounds are normal.  Musculoskeletal: Normal range of motion.  Neurological: He is alert and oriented to person, place, and time.  Skin: Skin is warm and dry.  Psychiatric:  Depression, and emotional lability.   Encounter Medications:   Outpatient Encounter Medications as of 02/10/2018  Medication Sig Note  . aspirin 81 MG tablet Take 81 mg by mouth daily.   Marland Kitchen atorvastatin (LIPITOR) 80 MG tablet Take 1 tablet (80 mg total) by mouth daily at 6 PM.    . bisoprolol (ZEBETA) 5 MG tablet Take 0.5 tablets (2.5 mg total) by mouth daily.   . budesonide-formoterol (SYMBICORT) 80-4.5 MCG/ACT inhaler Inhale 2 puffs into the lungs 2 (two) times daily.   Marland Kitchen buPROPion (WELLBUTRIN SR) 150 MG 12 hr tablet Take 1 tablet (150 mg total) by mouth 2 (two) times daily.   . dapagliflozin propanediol (FARXIGA) 10 MG TABS tablet Take 10 mg by mouth daily. 01/21/2018: Patient currently using samples as insurance will not cover medication  . lisinopril (PRINIVIL,ZESTRIL) 10 MG tablet Take 1 tablet (10 mg total) by mouth daily.   . metFORMIN (GLUCOPHAGE) 1000 MG tablet Take 1 tablet (1,000 mg total) by mouth 2 (two) times daily with a meal.   . nitroGLYCERIN (NITROSTAT) 0.4 MG SL tablet Place 1 tablet (0.4 mg total) under the tongue every 5 (five) minutes as needed for chest pain.   Marland Kitchen omeprazole (PRILOSEC) 20 MG capsule TAKE (1) CAPSULE DAILY   . primidone (MYSOLINE) 50 MG tablet Take 1 tablet (50 mg total) by mouth 4 (four) times daily.   . sitaGLIPtin (JANUVIA) 100 MG tablet Take 1 tablet (100 mg total) by mouth daily. 01/21/2018: Patient currently using samples as insurance will not cover medication  . ticagrelor (BRILINTA) 90 MG TABS tablet Take 1 tablet (90 mg total) by mouth 2 (two) times daily.   . fenofibrate (TRICOR) 48 MG tablet Take 1 tablet (48 mg total) by mouth daily. (Patient not taking: Reported on  02/10/2018)   . [DISCONTINUED] glimepiride (AMARYL) 4 MG tablet Take 4 mg by mouth 2 (two) times daily.    No facility-administered encounter medications on file as of 02/10/2018.     Functional Status:   In your present state of health, do you have any difficulty performing the following activities: 02/10/2018 01/21/2018  Hearing? N N  Comment - -  Vision? N N  Comment - -  Difficulty concentrating or making decisions? Y N  Comment - -  Walking or climbing stairs? Y Y  Comment - -  Dressing or bathing? N N  Doing errands, shopping? Y N  Preparing Food  and eating ? Y -  Using the Toilet? N -  In the past six months, have you accidently leaked urine? Y -  Do you have problems with loss of bowel control? Y -  Managing your Medications? Y -  Managing your Finances? Y -  Housekeeping or managing your Housekeeping? N -  Some recent data might be hidden    Fall/Depression Screening:    Fall Risk  02/10/2018 01/06/2018 12/13/2017  Falls in the past year? - No No  Comment - - -  Number falls in past yr: - - -  Comment - - -  Injury with Fall? - - -  Comment - - -  Risk Factor Category  - - -  Risk for fall due to : History of fall(s);Impaired balance/gait - -  Follow up - - -   PHQ 2/9 Scores 02/10/2018 01/06/2018 11/11/2017 10/06/2017 08/11/2017 07/07/2017 04/21/2017  PHQ - 2 Score 6 0 1 0 1 0 2  PHQ- 9 Score 16 - - - - - 4    Assessment:  Post STEMI                         Depression with emotional lability                         Hx smoking and COPD, has abstained since his MI.  Plan:  [facility] CM Care Plan Problem One     Most Recent Value  Care Plan Problem One  Post STEMI (CAD)  Role Documenting the Problem One  Care Management Coordinator  Care Plan for Problem One  Active  [facility] Long Term Goal   Pt will demonstrate life style changes necessary to reduce his chances of further cardiac damage over the next 60 days.  [facility] Long Term Goal Start Date  02/13/18  Interventions for Problem One Long Term Goal  Reviewed meds, diet, activity requiments and limitations.   [facility] CM Short Term Goal #1   Pt will continue to abstain from smoking over the next 30 days.  [facility] CM Short Term Goal #1 Start Date  02/13/18  Interventions for Short Term Goal #1  Praised for his abstinence since his hospitalization. Emphasized that smoking contributes to CAD and damage to other vessels wihich can contribute to PVD along with his diabetes.  [facility] CM Short Term Goal #2   Medication compliance  [facility] CM Short Term Goal #2 Start Date  02/13/18  Interventions for Short Term  Goal #2  Pt will work with pharmacy staff to acquire his medications for reduction in price.  [facility]N CM Short Term Goal #3  Pt will demonstrate improved dietary choices for CAD and diabetes as reported by pt and daughter over the next 30 days.  [facility]N CM Short Term  Goal #3 Start Date  02/13/18  Interventions for Short Tern Goal #3  Advised that we can work on this together and than changes can be made over time.    THN CM Care Plan Problem Two     Most Recent Value  Care Plan Problem Two  No Advanced Directives  Role Documenting the Problem Two  Care Management Coordinator  Care Plan for Problem Two  Active  [facility] CM Short Term Goal #1   Pt will compled Advanced Directives and MOST form over the next 30 days.  [facility]N CM Short Term Goal #1 Start Date  02/13/18  Interventions for Short Term Goal #2   Provided documents for pt to complete and have notarized.     I will call pt on 9/27 to follow up. I will provide pt with education for life style changes: diet, abstinence from smoking, medication compliance and exersie.  Eulah Pont. Myrtie Neither, MSN, [facility] Gerontological Nurse Practitioner [facility] Care Management [phone]

## 2018-02-13 ENCOUNTER — Encounter: Payer: Self-pay | Admitting: *Deleted

## 2018-02-13 ENCOUNTER — Telehealth: Payer: Self-pay | Admitting: Physician Assistant

## 2018-02-13 ENCOUNTER — Other Ambulatory Visit: Payer: Self-pay | Admitting: Pediatrics

## 2018-02-13 DIAGNOSIS — E119 Type 2 diabetes mellitus without complications: Secondary | ICD-10-CM

## 2018-02-13 MED ORDER — NITROGLYCERIN 0.4 MG SL SUBL: 0.4000 mg | SUBLINGUAL_TABLET | SUBLINGUAL | 1 refills | Status: DC | PRN

## 2018-02-13 MED ORDER — ATORVASTATIN CALCIUM 80 MG PO TABS: 80.0000 mg | ORAL_TABLET | Freq: Every day | ORAL | 3 refills | Status: DC

## 2018-02-13 MED ORDER — TICAGRELOR 90 MG PO TABS: 90.0000 mg | ORAL_TABLET | Freq: Two times a day (BID) | ORAL | 3 refills | Status: DC

## 2018-02-13 NOTE — Telephone Encounter (Signed)
Spoke with Stew @ [employer]. The are requesting refill auth for the pt's Brilinta, Atorvastatin and Nitro.   Pt was switched from Pravastatin to Atorvastatin at d/c. Pt was confused as to which statin he should be taking. Confirmed with Willaim Rayas he should be taking the Atorvastatin 80mg  daily. Rx sent Atorvastatin 80mg  daily #180 R-3 Brilinta 90mg  bid #180 R-3 Nitro-glycerin 0.4mg  prn as instructed #25 R-1

## 2018-02-14 ENCOUNTER — Other Ambulatory Visit: Payer: Self-pay

## 2018-02-14 NOTE — Patient Outreach (Signed)
[place] [facility]) Care Management  02/14/2018  Andrew [name] 12-Aug-1950 [id number]   67 year old male referred to [place] [facility] by [place] services requested for medication assistance for Brilinta, Symbicort, Januvia and [place].  PMHx includes, but not limited to, hypertension, h/o myocardial infarction, COPD, GERD, type 2 diabetes mellitus, bladder cancer, and hyperlipidemia.   Successful outreach attempt to Andrew [name].  HIPAA identifiers verified.   Subjective: Andrew [name] reports that he recently had a heart attack on 01/20/18. He was started on Brilinta and states he can't afford the monthly co-pay. He is also on Symbicort, Januvia and [place] and states that he can't afford these prescriptions either.  Each has a $47/mo co-pay.   He states he is receiving samples of Farxiga and Januvia from his PCP.  He reports that he is not able to review his medications with me because"my daughter has my list."    Medication Assistance: Per financial discussion, Andrew [name] does not qualify for Extra Help LIS.  [place] reports his prescription expenditure as $428.06.  Symbicort, Brilinta and [name] manufacturers require that he spend 3% of his income to be eligible for their assistance programs.  He currently needs to spend ~ $260 more before we can apply.    Call placed to PCP office to suggest the following mediation changes, which would allow [place] to apply for patient assistance now.   Januvia- No change.  [employer]ck does not require an out of pocket prescription expenditure to apply to their assistance program.  Symbicort- Change to [place] made by [employer].  [place] to Jardiance made by [employer] who does not require an out of pocket prescription expenditure to apply to their assistance program.  Brilinta- No change.  He will have to continue to buy, unless samples available.   Patient may be able to pay for one of these medications, if he receives  the other 3 for free.   Plan: Outreach to PCP, Dr. Evette Doffing with proposed substitutions to see if she approves.  In-basket message sent to [name] [facility] cardiology requesting Brilinta samples.   [name], PharmD Clinical Pharmacist [name] [phone]  In-basket message received from [name], [name].  She has samples of Brilinta that he can pick up at [place].  Message communicated to Andrew [name].    [name], PharmD Clinical Pharmacist [place] [phone]

## 2018-02-14 NOTE — Telephone Encounter (Signed)
Spoke with pt. Adv pt that samples of Brilinta will be left at the front desk for him to pick up. Adv pt that it is important that there is no interruption in his therapy. Pt voiced appreciation and sts that he will have his daughter pick them up some time this week

## 2018-02-15 ENCOUNTER — Ambulatory Visit: Payer: Self-pay

## 2018-02-15 ENCOUNTER — Telehealth: Payer: Self-pay | Admitting: Pediatrics

## 2018-02-15 ENCOUNTER — Other Ambulatory Visit: Payer: Self-pay

## 2018-02-15 NOTE — Telephone Encounter (Signed)
Spoke with Andrew [name] with [facility] Pt cannot afford Symbicort Is this ok to change to [facility] Also would like [place] changed to [place] Please review and advise

## 2018-02-15 NOTE — Telephone Encounter (Signed)
Yes, both changes are fine. I will put in order.

## 2018-02-15 NOTE — Telephone Encounter (Signed)
Yes, can change if will be more affordable

## 2018-02-15 NOTE — Patient Outreach (Signed)
[facility]) Care Management  02/15/2018  Andrew [name] 05-07-1951 [id number]  Outgoing call to PCP office Dr. Evette Doffing.  Left message for a return call from Dr. Autumn Patty nurse.  Incoming call received from PCP office.  Requested switch from Symbicort to [facility]  and Farxiga to [place], so [facility] can apply for patient assistance for these medications.   Plan: Await return call from PCP.  [name], PharmD Clinical Pharmacist [place] [phone]

## 2018-02-15 NOTE — Telephone Encounter (Signed)
Patient aware.

## 2018-02-15 NOTE — Telephone Encounter (Signed)
They would also like Farxiga changed to Jardiance if possible, please advise.

## 2018-02-17 ENCOUNTER — Other Ambulatory Visit: Payer: Self-pay | Admitting: *Deleted

## 2018-02-17 NOTE — Patient Outreach (Signed)
Transition of care call. [name] is doing well. He is getting around a little better, He has had one episode of CP that he thought about taking NTG but did not and the pain subsided. He is still abstaining from smoking. But he admits he thinks about it all the time. I suggested he get some plain roasted almonds that are good for him and keep a hand full of those to chew on when he gets an urge. He said he would try that. I will call him again in one week and see him mid month of October.  Eulah Pont. Myrtie Neither, MSN, [facility] Gerontological Nurse Practitioner [facility] Care Management [phone]

## 2018-02-21 ENCOUNTER — Other Ambulatory Visit: Payer: Self-pay

## 2018-02-21 ENCOUNTER — Telehealth: Payer: Self-pay | Admitting: Pediatrics

## 2018-02-21 NOTE — Patient Outreach (Addendum)
[facility] [facility]) Care Management  02/21/2018  Maui Britten 30-Aug-1950 [id number]  Outreach attempt #2 to PCP office, Dr. Evette Doffing.  Left a message requesting a switch from Symbicort to [facility]  and Farxiga to [place], so [place] can apply for patient assistance for these medications through [employer].  Plan: Await return call from PCP.  If no response, follow up on 10/4.  [name], PharmD Clinical Pharmacist [place] [phone]

## 2018-02-22 ENCOUNTER — Other Ambulatory Visit: Payer: Self-pay | Admitting: Pediatrics

## 2018-02-22 ENCOUNTER — Other Ambulatory Visit: Payer: Self-pay | Admitting: *Deleted

## 2018-02-22 MED ORDER — MOMETASONE FURO-FORMOTEROL FUM 100-5 MCG/ACT IN AERO: 2.0000 | INHALATION_SPRAY | Freq: Every day | RESPIRATORY_TRACT | 6 refills | Status: DC

## 2018-02-22 MED ORDER — EMPAGLIFLOZIN 25 MG PO TABS: 25.0000 mg | ORAL_TABLET | Freq: Every day | ORAL | 6 refills | Status: DC

## 2018-02-22 NOTE — Patient Outreach (Signed)
I made a home visit today in lieu of a telephone call. Daughter was also present. We discussed his diet and checking his glucose on a more regular schedule  I advised I would see them upon return from my leave of absence. I requested that he make an effort in the suggestions given and he said he would try.  Eulah Pont. Myrtie Neither, MSN, [facility] Gerontological Nurse Practitioner [facility] Care Management [phone]

## 2018-02-22 NOTE — Telephone Encounter (Signed)
Yes, that is fine. I put in orders, sent to [place].

## 2018-02-22 NOTE — Telephone Encounter (Signed)
FYI for provide [facility] aware of change in medicines.  They will be sending forms for Dr. Evette Doffing to sign and may request written scripts also.

## 2018-02-22 NOTE — Telephone Encounter (Signed)
Patient aware, script is ready. Left message on his voice mail.

## 2018-02-22 NOTE — Telephone Encounter (Signed)
Can you let [facility] know as well? I believe they are the ones who called.

## 2018-02-23 ENCOUNTER — Other Ambulatory Visit: Payer: Self-pay

## 2018-02-23 ENCOUNTER — Other Ambulatory Visit: Payer: Self-pay | Admitting: Pharmacy Technician

## 2018-02-23 NOTE — Patient Outreach (Signed)
[place] [facility]) Care Management  [place]   02/23/2018  Andrew [name] 10-27-1950 [id number]   Incoming call received from Dr. Autumn Patty office.  PCP will allow switch to [place] and Januvia [employer]) and [facility]ia [facility]) to apply for patient assistance.   67 year old male referred to [place] Management by [place] services requested for medication assistance for Brilinta, Symbicort, Januvia and [place].  PMHx includes, but not limited to, hypertension, h/o myocardial infarction, COPD, GERD, type 2 diabetes mellitus, bladder cancer, and hyperlipidemia.   Successful outreach attempt to Andrew [name].  HIPAA identifiers verified.   Objective:  HgA1c 7.2% on 01/24/18 SCr 0.91 mg/dL on 01/25/18  Current Medications: Current Outpatient Medications  Medication Sig Dispense Refill  . aspirin 81 MG tablet Take 81 mg by mouth daily.    Marland Kitchen atorvastatin (LIPITOR) 80 MG tablet Take 1 tablet (80 mg total) by mouth daily at 6 PM. 90 tablet 3  . bisoprolol (ZEBETA) 5 MG tablet Take 0.5 tablets (2.5 mg total) by mouth daily. 30 tablet 1  . buPROPion (WELLBUTRIN SR) 150 MG 12 hr tablet Take 1 tablet (150 mg total) by mouth 2 (two) times daily. 60 tablet 4  . empagliflozin (JARDIANCE) 25 MG TABS tablet Take 25 mg by mouth daily. 30 tablet 6  . fenofibrate (TRICOR) 48 MG tablet Take 1 tablet (48 mg total) by mouth daily. 30 tablet 5  . lisinopril (PRINIVIL,ZESTRIL) 10 MG tablet Take 1 tablet (10 mg total) by mouth daily. 90 tablet 1  . metFORMIN (GLUCOPHAGE) 1000 MG tablet Take 1 tablet by mouth 2 times daily with a meal. 180 tablet 0  . mometasone-formoterol (DULERA) 100-5 MCG/ACT AERO Inhale 2 puffs into the lungs daily. 13 g 6  . nitroGLYCERIN (NITROSTAT) 0.4 MG SL tablet Place 1 tablet (0.4 mg total) under the tongue every 5 (five) minutes as needed for chest pain. 25 tablet 1  . omeprazole (PRILOSEC) 20 MG capsule TAKE (1) CAPSULE DAILY 90 capsule 1  . primidone (MYSOLINE) 50 MG  tablet Take 1 tablet (50 mg total) by mouth 4 (four) times daily. 360 tablet 1  . sitaGLIPtin (JANUVIA) 100 MG tablet Take 1 tablet (100 mg total) by mouth daily. 30 tablet 5  . ticagrelor (BRILINTA) 90 MG TABS tablet Take 1 tablet (90 mg total) by mouth 2 (two) times daily. 180 tablet 3   No current facility-administered medications for this visit.     Functional Status: In your present state of health, do you have any difficulty performing the following activities: 02/10/2018 01/21/2018  Hearing? N N  Comment - -  Vision? N N  Comment - -  Difficulty concentrating or making decisions? Y N  Comment - -  Walking or climbing stairs? Y Y  Comment - -  Dressing or bathing? N N  Doing errands, shopping? Y N  Preparing Food and eating ? Y -  Using the Toilet? N -  In the past six months, have you accidently leaked urine? Y -  Do you have problems with loss of bowel control? Y -  Managing your Medications? Y -  Managing your Finances? Y -  Housekeeping or managing your Housekeeping? N -  Some recent data might be hidden    Fall/Depression Screening: Fall Risk  02/10/2018 01/06/2018 12/13/2017  Falls in the past year? - No No  Comment - - -  Number falls in past yr: - - -  Comment - - -  Injury with Fall? - - -  Comment - - -  Risk Factor Category  - - -  Risk for fall due to : History of fall(s);Impaired balance/gait - -  Follow up - - -   PHQ 2/9 Scores 02/10/2018 01/06/2018 11/11/2017 10/06/2017 08/11/2017 07/07/2017 04/21/2017  PHQ - 2 Score 6 0 1 0 1 0 2  PHQ- 9 Score 16 - - - - - 4   Medication Management:  Dr. Evette Doffing updated his medication list to reflect Dulera and Jardiance substitutions.  He is still taking [place] and Symbicort at this point. Informed Andrew [name] that we will begin the patinet assistance application process.  Unfortunately, he has not spent enough to apply for Brilinita assistance and he will need to continuee to ask for samples.  Patient and Cardiology PA  are aware of this situation. Plan: Route letter to [facility] CPhT, Etter Sjogren to begin patinet assistance application for [employer] and Januvia [employer]) and Geneticist, molecular (BI).  [name], PharmD Clinical Pharmacist [place] [phone]

## 2018-02-23 NOTE — Patient Outreach (Signed)
[name]) Care Management  02/23/2018  Andrew [name] Aug 02, 1950 [id number]   Received [employer] ([place]) and [employer] [name] patient assistance referral from [place]. Prepared patient portion of applications to be mailed. Faxed Jardiance application to Dr. Evette Doffing and mailed [facility] provider portion to Dr. Evette Doffing.  Will follow up with patient in 5-7 business days to confirm applications have been received.  [name], [name] Management [phone]

## 2018-02-24 ENCOUNTER — Telehealth: Payer: Self-pay | Admitting: *Deleted

## 2018-02-24 ENCOUNTER — Ambulatory Visit: Payer: Self-pay

## 2018-02-24 MED ORDER — BUDESONIDE-FORMOTEROL FUMARATE 80-4.5 MCG/ACT IN AERO: 2.0000 | INHALATION_SPRAY | Freq: Two times a day (BID) | RESPIRATORY_TRACT | 3 refills | Status: DC

## 2018-02-24 NOTE — Telephone Encounter (Signed)
Vm from [place] w/ Evette Georges is non-formulary Advair, Breo & Symbicort are covered If want to continue w/ [facility] PA is needed Phone number for [place] PA department is [phone]

## 2018-02-24 NOTE — Telephone Encounter (Signed)
Symbicort, though formulary, was still too expensive. Needed a Rx for [facility] so [facility] pharmacist can help with assistance with Dulera from the drug company. Per [facility] phone notes, I think they are sending forms over for [place] to sign for the [facility] [place] as next step to get him the medicine.

## 2018-02-24 NOTE — Telephone Encounter (Signed)
Nevermind, staff message from [facility], will stay on symbicort until approved. Rx sent in.

## 2018-02-27 ENCOUNTER — Ambulatory Visit: Payer: Medicare Other | Admitting: *Deleted

## 2018-03-02 ENCOUNTER — Other Ambulatory Visit: Payer: Self-pay | Admitting: Pharmacy Technician

## 2018-03-02 NOTE — Patient Outreach (Signed)
[place] [facility]) Care Management  03/02/2018  Andrew [name] 1950-08-05 [id number]   Successful call to [name], HIPAA identifiers verified. Patient states he has not received patient assistance applications that were mailed out to him.   Will follow up with him 3-5 business days to verify has received them.  Maud Deed [place], [place] Management [phone]

## 2018-03-03 ENCOUNTER — Other Ambulatory Visit: Payer: Self-pay

## 2018-03-03 NOTE — Patient Outreach (Signed)
[place] [facility]) Care Management  03/03/2018  Andrew [name] 07/08/1950 [id number]   Outreach call complete. [name] reported feeling well today. He denied complaints of pain or shortness of breath and stated that he has not smoked.  Reported a near fall due to "getting a little off balanced" while walking. He stated that he was not using his assistive device but has a cane to use as needed. He denied urgent concerns and was aware of his pending home visit with Deloria Lair.  PLAN Pending home visit on next week.   [place] [phone]

## 2018-03-06 ENCOUNTER — Other Ambulatory Visit: Payer: Self-pay | Admitting: *Deleted

## 2018-03-06 ENCOUNTER — Telehealth: Payer: Self-pay | Admitting: Pediatrics

## 2018-03-06 NOTE — Telephone Encounter (Signed)
No samples of farxiga at this time.  Patient aware.  Will call once samples have been received.

## 2018-03-06 NOTE — Patient Outreach (Signed)
[place] [facility]) Care Management  03/06/2018  Phillip Maffei 01/02/1951 [id number]  Home visit. [name] is making good progress.He is gaining his strength back. He denies any chest pain or SOB. He is paying for attention to what he is eating and is checking his glucose more often.  BP (!) 158/110   Pulse 80   Resp 18   SpO2 97%  FBS 133 RRR Lungs are clear.  CAD DM MCI  Continue to work on dietary management of his CAD and DM. I would like to send him to the [facility] in [place] with his daughter. I would like to think they can help him make some reasonable changes that he will agree to, to improve his outlook.  I will call him in 2 weeks and see him in 4.  Eulah Pont. Myrtie Neither, MSN, [facility] Gerontological Nurse Practitioner [facility] Care Management [phone]

## 2018-03-07 ENCOUNTER — Other Ambulatory Visit: Payer: Self-pay

## 2018-03-07 NOTE — Patient Outreach (Signed)
[place] [facility] Care Management  03/07/2018  Andrew [name] 1951/01/02 [id number]  In -basket message received from [facility] NP, Deloria Lair. She reports that patient states he has one day of Farxiga (dapagloflozin) left.  [place] has sent patient assistance applications to [name], but per last note from CPhT, Etter Sjogren, he had not received the forms.   Outreach call placed to Dr. Autumn Patty office to see if any office samples are available in the SGLT2 drug class (dapagliflozin- Farxiga, empagliflozin -Jardiance, canagliflozin-Invokana), while we work through the patient assistance process.  Medication Assistance substitutions approved by Dr. Evette Doffing: Current med: [name] (dapagliflozin)  Applying for Jardiance (empaglifozin) Current med: Symbicort (budesonide/formoterol)  Applying for [facility] (mometasone/formoterol)  Plan: Await return call from Dr. Evette Doffing.  If no response, will outreach to office 10/16.   [name], PharmD Clinical Pharmacist [place] [phone]

## 2018-03-08 ENCOUNTER — Ambulatory Visit: Payer: Self-pay | Admitting: Pharmacy Technician

## 2018-03-08 ENCOUNTER — Other Ambulatory Visit: Payer: Self-pay

## 2018-03-08 ENCOUNTER — Ambulatory Visit: Payer: Self-pay

## 2018-03-08 ENCOUNTER — Other Ambulatory Visit: Payer: Self-pay | Admitting: Pediatrics

## 2018-03-08 NOTE — Patient Outreach (Addendum)
[facility]) Care Management  03/08/2018  Andrew [name] 30-Jun-1950 [id number]  Message left at Dr. Autumn Patty office, requesting [name] samples for Andrew [name], while we work through the patient assistance application process.   Plan: Await call from office.  [name], PharmD Clinical Pharmacist [place] [phone]  Addendum: Incoming call received from [facility] at Dr. Autumn Patty office.  They have samples of Jardiance that Andrew [name] can come pick up as substitution for [place].  Andria Frames reports that she has already called the patient and told him to come pick up the samples.   Outreach call placed to Andrew [name] and HIPAA identifiers verified.   He states that he will pick up the Jardiance this afternoon.  Clarified with patient that the Vania Rea will be a substitution for [place] and that he should continue Januvia and Metformin for diabetes.  He verbalized understanding.  [name], PharmD [place] [phone]

## 2018-03-08 NOTE — Telephone Encounter (Signed)
Yes, jardiance 10mg 

## 2018-03-08 NOTE — Telephone Encounter (Signed)
Out of farxiga samples.  Can patient switch to Jardiance?

## 2018-03-08 NOTE — Telephone Encounter (Signed)
Samples of Jardiance 10mg  ready for pick up.  Patient aware and home health nurse aware

## 2018-03-09 ENCOUNTER — Other Ambulatory Visit: Payer: Self-pay | Admitting: Pediatrics

## 2018-03-09 DIAGNOSIS — B351 Tinea unguium: Secondary | ICD-10-CM | POA: Diagnosis not present

## 2018-03-09 DIAGNOSIS — M79676 Pain in unspecified toe(s): Secondary | ICD-10-CM | POA: Diagnosis not present

## 2018-03-09 DIAGNOSIS — E119 Type 2 diabetes mellitus without complications: Secondary | ICD-10-CM

## 2018-03-09 DIAGNOSIS — E1142 Type 2 diabetes mellitus with diabetic polyneuropathy: Secondary | ICD-10-CM | POA: Diagnosis not present

## 2018-03-09 DIAGNOSIS — L84 Corns and callosities: Secondary | ICD-10-CM | POA: Diagnosis not present

## 2018-03-10 ENCOUNTER — Ambulatory Visit: Payer: Self-pay | Admitting: Pharmacy Technician

## 2018-03-10 ENCOUNTER — Other Ambulatory Visit: Payer: Self-pay | Admitting: Pharmacy Technician

## 2018-03-10 NOTE — Patient Outreach (Signed)
[place] [facility]) Care Management  03/10/2018  Chin Wachter October 22, 1950 [id number]   Successful call to patient, HIPAA identifiers verified. Mr. Fye confirms that he received patient assistance applications that were mailed to hima nd he hopes to get them back in the mail this weekend.  Will follow up with patient in 7-10 business days if documents have not been received.  Maud Deed [place], [place] Management [phone]

## 2018-03-15 ENCOUNTER — Ambulatory Visit: Payer: Medicare Other

## 2018-03-16 ENCOUNTER — Telehealth: Payer: Self-pay

## 2018-03-16 DIAGNOSIS — I10 Essential (primary) hypertension: Secondary | ICD-10-CM | POA: Diagnosis not present

## 2018-03-16 DIAGNOSIS — I25110 Atherosclerotic heart disease of native coronary artery with unstable angina pectoris: Secondary | ICD-10-CM | POA: Diagnosis not present

## 2018-03-16 DIAGNOSIS — J441 Chronic obstructive pulmonary disease with (acute) exacerbation: Secondary | ICD-10-CM | POA: Diagnosis not present

## 2018-03-16 DIAGNOSIS — I213 ST elevation (STEMI) myocardial infarction of unspecified site: Secondary | ICD-10-CM | POA: Diagnosis not present

## 2018-03-16 DIAGNOSIS — F17200 Nicotine dependence, unspecified, uncomplicated: Secondary | ICD-10-CM | POA: Diagnosis not present

## 2018-03-16 DIAGNOSIS — E1159 Type 2 diabetes mellitus with other circulatory complications: Secondary | ICD-10-CM | POA: Diagnosis not present

## 2018-03-16 DIAGNOSIS — E1169 Type 2 diabetes mellitus with other specified complication: Secondary | ICD-10-CM | POA: Diagnosis not present

## 2018-03-16 DIAGNOSIS — E785 Hyperlipidemia, unspecified: Secondary | ICD-10-CM | POA: Diagnosis not present

## 2018-03-16 LAB — LIPID PANEL
CHOL/HDL RATIO: 2.9 ratio (ref 0.0–5.0)
CHOLESTEROL TOTAL: 110 mg/dL (ref 100–199)
HDL: 38 mg/dL — ABNORMAL LOW (ref >39)
LDL Calculated: 43 mg/dL (ref 0–99)
TRIGLYCERIDES: 146 mg/dL (ref 0–149)
VLDL Cholesterol Cal: 29 mg/dL (ref 5–40)

## 2018-03-16 NOTE — Telephone Encounter (Signed)
Pt came in for Brilinta 90mg  samples and was given #32...  LOT [id number] [phone]

## 2018-03-20 ENCOUNTER — Other Ambulatory Visit: Payer: Self-pay | Admitting: *Deleted

## 2018-03-21 ENCOUNTER — Ambulatory Visit: Payer: Self-pay | Admitting: Pharmacy Technician

## 2018-03-23 ENCOUNTER — Other Ambulatory Visit: Payer: Self-pay | Admitting: *Deleted

## 2018-03-23 DIAGNOSIS — M545 Low back pain, unspecified: Secondary | ICD-10-CM

## 2018-03-23 MED ORDER — CYCLOBENZAPRINE HCL 10 MG PO TABS: 10.0000 mg | ORAL_TABLET | Freq: Three times a day (TID) | ORAL | 0 refills | Status: AC | PRN

## 2018-03-23 MED ORDER — HYDROCODONE-ACETAMINOPHEN 5-325 MG PO TABS: 1.0000 | ORAL_TABLET | Freq: Three times a day (TID) | ORAL | Status: DC | PRN

## 2018-03-23 MED ORDER — CYCLOBENZAPRINE HCL 10 MG PO TABS: 10.0000 mg | ORAL_TABLET | Freq: Three times a day (TID) | ORAL | 0 refills | Status: DC | PRN

## 2018-03-23 NOTE — Addendum Note (Signed)
Addended by: Deloria Lair on: 03/23/2018 01:40 PM   Modules accepted: Orders

## 2018-03-24 ENCOUNTER — Other Ambulatory Visit: Payer: Self-pay | Admitting: Pharmacy Technician

## 2018-03-24 NOTE — Patient Outreach (Signed)
[place] [facility]) Care Management  03/24/2018  Kobi Mario 1951/05/14 [id number]   Successful call to patient, HIPAA identifiers verified. Patient states that he filled out as much as could with the patient assistance applications then gave them to his daughter to complete. He states he is unsure if she has sent them out or not.  Successful call to patients daughter, Luellen Pucker, HIPAA identifiers verified. Luellen Pucker confirmed that her father gave her the patient assistance paperwork and that she planned to mail them back today. She confirmed that going forward with patient assistance that we should contact her directly and she saved my number in case she has additional questions.  Will submit completed applications once all documents have been received.  Maud Deed [place] Cord Certified Pharmacy Technician [facility] Care Management [phone]

## 2018-03-27 ENCOUNTER — Other Ambulatory Visit: Payer: Self-pay | Admitting: *Deleted

## 2018-03-27 NOTE — Patient Outreach (Addendum)
Routine home visit. Pt is doing much better, reports much less back pain and spasm with taking the flexeril alone. He did not pick up the hydrocodone/apap Rx. He does not feel like he needs it. His blood glucose is usually running close to 150 In the am. His daughter will be calling to make the nutritional assessment. Continues to abstain from smoking! Continues to make progress with smarter dietary choices.   BP 100/60   Pulse 86   Resp 18   SpO2 98%  (Standing BP 120/ 80) RRR Lungs are clear No peripheral edema  A:  Acute lumbar back pain      HTN - reading low normal today, no orthostatic drop      CAD      DM      HTN  P:  Continue flexeril as needed. If you don't feel like you need the pain medication0 don't pick it up.       Monitor your blood pressure when ever you can. Report any level below 100/60.       I will call in 2 weeks and we will discuss plans to close case.         Eulah Pont. Myrtie Neither, MSN, [facility] Gerontological Nurse Practitioner [facility] Care Management [phone]

## 2018-03-31 ENCOUNTER — Other Ambulatory Visit: Payer: Self-pay | Admitting: Pharmacy Technician

## 2018-03-31 NOTE — Patient Outreach (Signed)
[place] [facility]) Care Management  03/31/2018  Andrew [name] Feb 06, 1951 [id number]   Received patient portions of patient assistance applications. Prepared [employer] (Dulera and Proventil) to be mailed to company. Faxed Jardiance app into [employer].  Will follow up with [employer] in 10-14 business days and [employer] in 7-10 business days to check status of applications.  Maud Deed Chana Bode [place] Certified Pharmacy Technician [place] Management Direct [phone]

## 2018-04-07 ENCOUNTER — Other Ambulatory Visit: Payer: Self-pay | Admitting: Pharmacy Technician

## 2018-04-07 NOTE — Patient Outreach (Signed)
[place] [facility]) Care Management  04/07/2018  Andrew [name] 09-10-1950 [id number]   Follow up call to [employer] to check status of application for Jardiance. [name] confirmed that patient has been approved as of 11/12 until 05/24/2019.   Follow up call to [employer] patient assistance to confirm application that was mailed in had been received. [name] stated that application has not been received as of yet.  Will follow up with [employer] in 3-5 business days to confirm application was received.  Maud Deed Chana Bode [place] Certified Pharmacy Technician [place] Management Direct [phone]

## 2018-04-10 ENCOUNTER — Encounter: Payer: Self-pay | Admitting: Pediatrics

## 2018-04-10 ENCOUNTER — Ambulatory Visit (INDEPENDENT_AMBULATORY_CARE_PROVIDER_SITE_OTHER): Payer: Medicare Other | Admitting: Pediatrics

## 2018-04-10 ENCOUNTER — Other Ambulatory Visit: Payer: Self-pay | Admitting: *Deleted

## 2018-04-10 VITALS — BP 100/72 | HR 80 | Temp 97.0°F | Ht 75.0 in | Wt 201.6 lb

## 2018-04-10 DIAGNOSIS — I25110 Atherosclerotic heart disease of native coronary artery with unstable angina pectoris: Secondary | ICD-10-CM | POA: Diagnosis not present

## 2018-04-10 DIAGNOSIS — E119 Type 2 diabetes mellitus without complications: Secondary | ICD-10-CM

## 2018-04-10 DIAGNOSIS — R399 Unspecified symptoms and signs involving the genitourinary system: Secondary | ICD-10-CM | POA: Diagnosis not present

## 2018-04-10 DIAGNOSIS — I2510 Atherosclerotic heart disease of native coronary artery without angina pectoris: Secondary | ICD-10-CM | POA: Diagnosis not present

## 2018-04-10 DIAGNOSIS — R809 Proteinuria, unspecified: Secondary | ICD-10-CM

## 2018-04-10 LAB — BAYER DCA HB A1C WAIVED: HB A1C: 7.7 % — AB (ref <7.0)

## 2018-04-10 MED ORDER — LISINOPRIL 5 MG PO TABS: 5.0000 mg | ORAL_TABLET | Freq: Every day | ORAL | 1 refills | Status: DC

## 2018-04-10 MED ORDER — TAMSULOSIN HCL 0.4 MG PO CAPS: 0.4000 mg | ORAL_CAPSULE | Freq: Every day | ORAL | 1 refills | Status: DC

## 2018-04-10 NOTE — Progress Notes (Signed)
  Subjective:   Patient ID: Andrew [name], male    DOB: May 06, 1951, 67 y.o.   MRN: [id number] CC: Medical Management of Chronic Issues (diabetes)  HPI: [name] is a 67 y.o. male   Coronary artery disease: He had STEMI about 3 months ago.  Multivessel CAD seen on PCI, treated with balloon angioplasty and drug-eluting stent.  Culprit lesion was unclear.  He does not think he is regularly having chest pain.  If he thinks he starting to [facility] himself, he takes a break.  Remains active on his farm, feeding horses.  Tobacco use: Has not smoked cigarettes in the last 3 months.  Diabetes: Taking medicines regularly.  Has been working hard at avoiding carbohydrates and sugary foods since heart attack.  Sometimes feels like he does not completely empty his bladder.  He has to change position or press on his stomach to improve emptying.  This is been going on for a while.  No incontinence.  Relevant past medical, surgical, family and social history reviewed. Allergies and medications reviewed and updated. Social History   Tobacco Use  Smoking Status Former Smoker  . Packs/day: 1.00  . Years: 42.00  . Pack years: 42.00  . Types: Cigarettes  . Last attempt to quit: 12/22/2017  . Years since quitting: 0.3  Smokeless Tobacco Never Used   ROS: Per HPI   Objective:    BP 100/72   Pulse 80   Temp (!) 97 F (36.1 C) (Oral)   Ht 6\' 3"  (1.905 m)   Wt 201 lb 9.6 oz (91.4 kg)   BMI 25.20 kg/m   Wt Readings from Last 3 Encounters:  04/10/18 201 lb 9.6 oz (91.4 kg)  02/10/18 191 lb (86.6 kg)  02/02/18 197 lb 12.8 oz (89.7 kg)    Gen: NAD, alert, cooperative with exam, NCAT EYES: EOMI, no conjunctival injection, or no icterus ENT:  TMs pearly gray b/l, OP without erythema LYMPH: no cervical LAD CV: NRRR, normal S1/S2, no murmur, distal pulses 2+ b/l Resp: CTABL, no wheezes, normal WOB Abd: +BS, soft, NTND. no guarding or organomegaly Ext: No edema, warm Neuro: Alert and oriented,  strength equal b/l UE and LE, coordination grossly normal MSK: normal muscle bulk  Assessment & Plan:  [name] was seen today for medical management of chronic issues.  Diagnoses and all orders for this visit:  Type 2 diabetes mellitus without complication, without long-term current use of insulin (HCC) A1c 7.7.  Includes time period while hospitalized for MI with sugars were quite elevated.  Continue current medicines.  Will recheck A1c 3 months. -     Bayer DCA Hb A1c Waived  Microalbuminuria Take below.  Let me know if any further lightheadedness or dizziness. -     lisinopril (PRINIVIL,ZESTRIL) 5 MG tablet; Take 1 tablet (5 mg total) by mouth daily.  Lower urinary tract symptoms If not improving needs to see urology.  Last PSA 06/2017, not elevated. -     tamsulosin (FLOMAX) 0.4 MG CAPS capsule; Take 1 capsule (0.4 mg total) by mouth daily.  Coronary artery disease involving native heart, angina presence unspecified, unspecified vessel or lesion type Any chest pain, chest tightness or shortness of breath patient aware he needs to be seen.  Follow up plan: Return in about 3 months (around 07/11/2018). Assunta Found, MD [place]

## 2018-04-12 ENCOUNTER — Telehealth: Payer: Self-pay | Admitting: Cardiology

## 2018-04-12 NOTE — Telephone Encounter (Signed)
New message:   Patient calling about some samples of brilinta 90.

## 2018-04-12 NOTE — Telephone Encounter (Signed)
Returned call to patient Brilinta 90 mg samples left at [facility] office front desk. 

## 2018-04-14 ENCOUNTER — Other Ambulatory Visit: Payer: Self-pay | Admitting: Pharmacy Technician

## 2018-04-14 NOTE — Patient Outreach (Signed)
[place] [facility]) Care Management  04/14/2018  Bernd Crom 1951-01-01 [id number]   Successful call to patient, HIPAA identifiers verified. Informed Mr. Dowell that [employer] has mailed him an attestation form that needs to be completed and mailed back to them. I requested that he contact me when he receives the letter so I can help him fill it out and he stated he would. He also confirmed that he received his Jardiance from [employer] in the mail.  Will follow up with patient in 7-10 business days if he has not contacted me about letter.  Maud Deed Chana Bode [place] Certified Pharmacy Technician [place] Management Direct Dial:[phone]

## 2018-04-17 NOTE — Patient Outreach (Signed)
Late entry for telephone assessment on 04/11/18.  Andrew [name] reports he had a doctor's visit on the 18th with Dr. Evette Doffing. He says his HgbA1C has gone up from 6.8 to 7.7. However it was 8.4, 6 months ago. He is not sure if his daughter has made his appt yet with the diabetes [facility] nutrition center. There have been several missed calls. He does say he is trying to be mindful of what he is eating.  I note that he refused a flu shot.  He is still abstaining from smoking but states he still wants one.  He denies any CP or SOB.  I will see him in 2 weeks for my final home visit.  Eulah Pont. Myrtie Neither, MSN, [facility] Gerontological Nurse Practitioner [facility] Care Management [phone]

## 2018-04-18 ENCOUNTER — Other Ambulatory Visit: Payer: Self-pay | Admitting: Pharmacy Technician

## 2018-04-18 NOTE — Patient Outreach (Signed)
[facility]) Care Management  04/18/2018  Andrew [name] [birth year] [id number]  Successful return call to patient, HIPAA identifiers verified. Patient stated that he had received attestation form from [employer] patient assistance. Assisted patient with filling out form and what documents needed to be mailed back into company. He states that he will put in the mail tomorrow.  Will follow up with [employer] in 10-14 business days to confirm attestation form has been received.  Maud Deed Chana Bode [place] Certified Pharmacy Technician [place] Management Direct Dial:[phone]

## 2018-04-27 ENCOUNTER — Telehealth: Payer: Self-pay | Admitting: Cardiology

## 2018-04-27 NOTE — Telephone Encounter (Signed)
  Patient calling the office for samples of medication:   1.  What medication and dosage are you requesting samples for? ticagrelor (BRILINTA) 90 MG TABS tablet  2.  Are you currently out of this medication? Has 3 days left

## 2018-04-27 NOTE — Telephone Encounter (Signed)
Spoke with pt. Adv pt that samples of Brilinta will be left at the front desk for pick up. Pt voiced appreciation for the assistance.   Medication Samples have been provided to the patient.  Drug name: Brilinta   Strength: 90mg        Qty: 4 bottles  NOB:[id number]  Exp.Date: 07/22  Dosing instructions: Take 1 tablet twice daily

## 2018-05-02 ENCOUNTER — Other Ambulatory Visit: Payer: Self-pay | Admitting: *Deleted

## 2018-05-02 NOTE — Patient Outreach (Signed)
[facility]) Care Management  05/02/2018  Onur Mori 12-18-1950 [id number]  This visit was cancelled and rescheduled.        Eulah Pont. Myrtie Neither, MSN, [facility] Gerontological Nurse Practitioner [facility] Care Management [phone]

## 2018-05-03 ENCOUNTER — Ambulatory Visit (INDEPENDENT_AMBULATORY_CARE_PROVIDER_SITE_OTHER): Payer: Medicare Other | Admitting: Urology

## 2018-05-03 ENCOUNTER — Ambulatory Visit (HOSPITAL_COMMUNITY)
Admission: RE | Admit: 2018-05-03 | Discharge: 2018-05-03 | Disposition: A | Discharge status: Home / Self Care | Payer: Medicare Other | Source: Ambulatory Visit | Attending: Urology | Admitting: Urology

## 2018-05-03 ENCOUNTER — Other Ambulatory Visit: Payer: Self-pay | Admitting: Urology

## 2018-05-03 DIAGNOSIS — C671 Malignant neoplasm of dome of bladder: Secondary | ICD-10-CM | POA: Diagnosis not present

## 2018-05-03 DIAGNOSIS — N50811 Right testicular pain: Secondary | ICD-10-CM

## 2018-05-03 DIAGNOSIS — N433 Hydrocele, unspecified: Secondary | ICD-10-CM | POA: Diagnosis not present

## 2018-05-08 ENCOUNTER — Encounter: Payer: Self-pay | Admitting: Cardiology

## 2018-05-08 ENCOUNTER — Ambulatory Visit (INDEPENDENT_AMBULATORY_CARE_PROVIDER_SITE_OTHER): Payer: Medicare Other | Admitting: Cardiology

## 2018-05-08 ENCOUNTER — Other Ambulatory Visit: Payer: Self-pay | Admitting: Pharmacy Technician

## 2018-05-08 VITALS — BP 118/68 | HR 82 | Ht 76.0 in | Wt 206.4 lb

## 2018-05-08 DIAGNOSIS — Z955 Presence of coronary angioplasty implant and graft: Secondary | ICD-10-CM

## 2018-05-08 DIAGNOSIS — I10 Essential (primary) hypertension: Secondary | ICD-10-CM | POA: Diagnosis not present

## 2018-05-08 DIAGNOSIS — I25119 Atherosclerotic heart disease of native coronary artery with unspecified angina pectoris: Secondary | ICD-10-CM

## 2018-05-08 DIAGNOSIS — F17200 Nicotine dependence, unspecified, uncomplicated: Secondary | ICD-10-CM

## 2018-05-08 DIAGNOSIS — I208 Other forms of angina pectoris: Secondary | ICD-10-CM | POA: Diagnosis not present

## 2018-05-08 DIAGNOSIS — E1169 Type 2 diabetes mellitus with other specified complication: Secondary | ICD-10-CM | POA: Diagnosis not present

## 2018-05-08 DIAGNOSIS — E785 Hyperlipidemia, unspecified: Secondary | ICD-10-CM

## 2018-05-08 MED ORDER — ISOSORBIDE MONONITRATE ER 30 MG PO TB24: 30.0000 mg | ORAL_TABLET | Freq: Every day | ORAL | 3 refills | Status: DC

## 2018-05-08 NOTE — Patient Instructions (Addendum)
Medication Instructions:  Hold your atorvastatin for 2 weeks --if muscle pains are better, restart 40 mg daily (1/2 tablet) --if muscle pains are not better, restart 80 mg daily (1 tablet)  DECREASE Lisinopril to 5 mg daily START isosorbide (Imdur) 30 mg daily at night (take 30 mins after aspirin)  Night time medications: Aspirin, bisoprolol, metformin, and isosorbide (Imdur)  If you need a refill on your cardiac medications before your next appointment, please call your pharmacy.   Lab work: Please return for FASTING labs in Feb/March (CMET,Lipid)  Our in office lab hours are Monday-Friday 8:00-4:00, closed for lunch 12:45-1:45 pm.  No appointment needed.  If you have labs (blood work) drawn today and your tests are completely normal, you will receive your results only by: Marland Kitchen MyChart Message (if you have MyChart) OR . A paper copy in the mail If you have any lab test that is abnormal or we need to change your treatment, we will call you to review the results.  Follow-Up: At [facility], you and your health needs are our priority.  As part of our continuing mission to provide you with exceptional heart care, we have created designated Provider Care Teams.  These Care Teams include your primary Cardiologist (physician) and Advanced Practice Providers (APPs -  Physician Assistants and Nurse Practitioners) who all work together to provide you with the care you need, when you need it. You will need a follow up appointment in 3 months.  Please call our office 2 months in advance to schedule this appointment.  You may see Glenetta Hew, MD or one of the following Advanced Practice Providers on your designated Care Team:   Rosaria Ferries, PA-C . Jory Sims, DNP, ANP

## 2018-05-08 NOTE — Progress Notes (Signed)
PCP: Eustaquio Maize, MD  Clinic Note: Chief Complaint  Patient presents with  . Follow-up    Intermittent chest discomfort, but nothing like his anginal pain on presentation  . Muscle Pain    HPI: Andrew [name] is a 67 y.o. male with a PMH below who presents today for 3 month f/u for CAD-PCI in setting of ~STEMI/nonSTEMI  His other past medical history includes DM-2 on insulin, hypertension, hyperlipidemia, COPD (longstanding smoking), OSA (not on BiPAP), history of CVA.  Recent Hospitalizations:   Admitted to [facility] on January 20, 2018 with a prolonged episode of substernal chest pain.  He had pain off and on night long.  Ruled in for MI overnight, and was noted to have chest pain upon evaluation in the morning with EKG symptoms concerning for possible salvation MI.  He was brought for urgent catheterization on January 21, 2018 revealing significant CAD including distal LAD and circumflex occlusion.  Both appeared to be chronic as I was unable to truly open up the circumflex.  Therefore stented the ramus intermedius.  His symptoms improved and he was discharged relatively stable.  Studies Personally Reviewed - (if available, images/films reviewed: From Epic Chart or Care Everywhere)  Cath 01/21/2018: MV CAD -> RAMUS 95%--PCI Synergy DES 2.25 mm x 12 mm - 2.4 mm.  80% ost-pCx lesion treated with balloon angioplasty, distal circumflex lesion of 80% stenosis treated with balloon angioplasty with minimal effect.  100% stenosed dCx unable to cross lesion with the wire or balloon, 95% , CTO of the distal LAD not amenable to PCI.  RPDA 80% and 70% as well as P AV 180%, PA V2 60%.  (too small for PCI).  Diagnostic          Intervention     Transthoracic Echo 01/21/2018: Mild LVH.  EF 50 and 55%.  Apical inferior hypokinesis.  Mid-apical anterolateral hypokinesis.  Normal diastolic function for age   Andrew [name] was last seen on 02/02/2018 by Andrew Sharp, PA-C.  Noted intermittent  episodes of chest pain that he was "normal".  Was not able to tell any frequency or duration.  Did not want to take any additional medications.  Had successfully quit smoking.  Interval History: Andrew [name] presents here today very tearful and almost seemingly under not understanding what happened.  A good portion of our visit simply was him asking whether or not he had acute heart attack, when he had a heart attack and details. He still says he has off and on symptoms in his chest but they do not last very long and do not really bother him that much.  He also has noted off-and-on shortness of breath, but really notes that he has baseline shortness of breath this was not sure what to make of this.  He says his energy is finally starting to pick back up again.  He has noted some significant orthostatic dizziness --he indicates that he actually has been taking 10 mg lisinopril as opposed to 5. He has not really noted any other dyspnea episodes that it would be associated with Brilinta.  He denies any PND orthopnea, but he really is difficult to read on this.  He sleeps on the couch because it is easier for him to get comfortable.  He wakes up a lot at night with nocturia, and so he does not really get great sleep.  Off & on CP/pressure - not necessarily with exertion.  In fact he does not notice any real  exertional chest pain..    Rare off-and-on flutters but no rapid palpitations. No syncope/near syncope, or TIA/amaurosis fugax symptoms. No melena, hematochezia, hematuria, or epstaxis. No claudication.  ROS: A comprehensive was performed. Review of Systems  Constitutional: Positive for malaise/fatigue (Does not sleep well, and has not really gotten back into doing any real exercise.  Limited a lot by his COPD and arthritis pains.). Negative for weight loss (Actually seems to have gained weight since his hospitalization).  HENT: Positive for hearing loss. Negative for congestion and nosebleeds.     Respiratory: Positive for cough (In the mornings), sputum production (Just phlegm), shortness of breath (Not much different than baseline.  With some exertional dyspnea.) and wheezing.   Musculoskeletal: Positive for joint pain. Negative for falls.  Neurological: Positive for dizziness (Orthostatic) and weakness (Global). Negative for focal weakness.  Psychiatric/Behavioral: Positive for depression (Not per his direct admission, but by his lack of desire to do things.  Also very tearful). The patient has insomnia (Not really as much insomnia is nocturia).   All other systems reviewed and are negative.   I have reviewed and (if needed) personally updated the patient's problem list, medications, allergies, past medical and surgical history, social and family history.   Past Medical History:  Diagnosis Date  . Allergy   . Anemia   . Arthritis   . Cancer [facility])    Bladder  . Depression   . Diabetes mellitus without complication ([place])   . GERD (gastroesophageal reflux disease)   . Hyperlipidemia   . Hypertension    in the past no longer on medication  . Neuromuscular disorder ([place])   . Sleep apnea    BiPAP no used in 3 years  . Status post tendon repair 1989  . STEMI (ST elevation myocardial infarction) ([place]) 01/20/2018   Cardiac cath January 21, 2018: Severe multivessel disease with unclear lesion, but opted for PCI/DES x1 to the RI.  Appeared to have CTO of the distal LAD and circumflex as well as moderate mid LAD and diagonal disease as well as PDA.Marland Kitchen  Unable to cross circumflex lesion.  . Stroke ([place])    At times pt has dizziness with loss of vision  . Tremors of nervous system 2011    Past Surgical History:  Procedure Laterality Date  . APPENDECTOMY    . BLADDER SURGERY    . COLONOSCOPY    . CORONARY STENT INTERVENTION N/A 01/21/2018   Procedure: CORONARY STENT INTERVENTION;  Surgeon: Leonie Man, MD;  Location: [place] CV LAB;  Service: Cardiovascular;  Laterality: N/A;   95% Ramus Intermedius -PCI with synergy DES 2.25 mm x 12 mm postdilated 2.4 mm.  [place] [place] ACUTE MI REVASCULARIZATION N/A 01/21/2018   Procedure: Coronary/Graft Acute MI Revascularization;  Surgeon: Leonie Man, MD;  Location: [place] CV LAB;  Service: Cardiovascular;  Laterality: N/A;  attempted revas to distal CFX;   . CYSTOSCOPY W/ RETROGRADES Bilateral 08/22/2015   Procedure: CYSTOSCOPY WITH RETROGRADE PYELOGRAM;  Surgeon: Irine Seal, MD;  Location: [facility];  Service: Urology;  Laterality: Bilateral;  . CYSTOSCOPY W/ RETROGRADES Bilateral 01/16/2016   Procedure: CYSTOSCOPY WITH RETROGRADE PYELOGRAM;  Surgeon: Irine Seal, MD;  Location: [facility];  Service: Urology;  Laterality: Bilateral;  . CYSTOSCOPY W/ RETROGRADES Bilateral 01/07/2017   Procedure: CYSTOSCOPY WITH RETROGRADE PYELOGRAM;  Surgeon: Irine Seal, MD;  Location: [facility];  Service: Urology;  Laterality: Bilateral;  . CYSTOSCOPY WITH BIOPSY N/A 08/22/2015   Procedure: CYSTOSCOPY WITH BLADDER BIOPSY;  Surgeon: Irine Seal, MD;  Location: [facility];  Service: Urology;  Laterality: N/A;  . CYSTOSCOPY WITH BIOPSY N/A 01/16/2016   Procedure: CYSTOSCOPY WITH BLADDER BIOPSY;  Surgeon: Irine Seal, MD;  Location: [facility];  Service: Urology;  Laterality: N/A;  . CYSTOSCOPY WITH FULGERATION N/A 01/16/2016   Procedure: CYSTOSCOPY WITH FULGERATION;  Surgeon: Irine Seal, MD;  Location: [facility];  Service: Urology;  Laterality: N/A;  . KNEE ARTHROSCOPY Right 1989  . LEFT HEART CATH AND CORONARY ANGIOGRAPHY N/A 01/21/2018   Procedure: LEFT HEART CATH AND CORONARY ANGIOGRAPHY;  Surgeon: Leonie Man, MD;  Location: [place] CV LAB;  Service: Cardiovascular;; 95% RI-DES PCI.  80% o-pCX (PTCA) -> dCX 80%-100% CTO (unsuccessful PTCA unable to cross distal lesion with balloon.)  100% CTO dLAD. RPDA 80% and 70% as well as P AV 180%, PA V2 60%.  (too small for PCI)  . ROTATOR CUFF REPAIR Bilateral 2000  . rt. leg fracture surgery    . TRANSTHORACIC  ECHOCARDIOGRAM  01/21/2018   Mild LVH.  EF 50 and 55%.  Apical inferior hypokinesis.  Mid-apical anterolateral hypokinesis.  Normal diastolic function for age   . TRANSURETHRAL RESECTION OF BLADDER TUMOR N/A 01/07/2017   Procedure: TRANSURETHRAL RESECTION OF BLADDER TUMOR (TURBT);  Surgeon: Irine Seal, MD;  Location[facility];  Service: Urology;  Laterality: N/A;  . WISDOM TOOTH EXTRACTION      Current Meds  Medication Sig  . aspirin 81 MG tablet Take 81 mg by mouth daily.  Marland Kitchen atorvastatin (LIPITOR) 80 MG tablet Take 1 tablet (80 mg total) by mouth daily at 6 PM. (Patient not taking: Reported on 05/09/2018)  . bisoprolol (ZEBETA) 5 MG tablet Take 0.5 tablets (2.5 mg total) by mouth daily.  . budesonide-formoterol (SYMBICORT) 80-4.5 MCG/ACT inhaler Inhale 2 puffs into the lungs 2 (two) times daily.  Marland Kitchen buPROPion (WELLBUTRIN SR) 150 MG 12 hr tablet Take 1 tablet (150 mg total) by mouth 2 (two) times daily.  . empagliflozin (JARDIANCE) 25 MG TABS tablet Take 25 mg by mouth daily.  . fenofibrate (TRICOR) 48 MG tablet Take 1 tablet (48 mg total) by mouth daily.  Marland Kitchen HYDROCODONE-ACETAMINOPHEN PO Take by mouth.  Marland Kitchen lisinopril (PRINIVIL,ZESTRIL) 5 MG tablet Take 1 tablet (5 mg total) by mouth daily.  . nitroGLYCERIN (NITROSTAT) 0.4 MG SL tablet Place 1 tablet (0.4 mg total) under the tongue every 5 (five) minutes as needed for chest pain.  . primidone (MYSOLINE) 50 MG tablet Take 1 tablet (50 mg total) by mouth 4 (four) times daily.  . sitaGLIPtin (JANUVIA) 100 MG tablet Take 1 tablet (100 mg total) by mouth daily.  . tamsulosin (FLOMAX) 0.4 MG CAPS capsule Take 1 capsule (0.4 mg total) by mouth daily.  . ticagrelor (BRILINTA) 90 MG TABS tablet Take 1 tablet (90 mg total) by mouth 2 (two) times daily.  . [DISCONTINUED] metFORMIN (GLUCOPHAGE) 1000 MG tablet Take 1 tablet by mouth 2 times daily with a meal.  . [DISCONTINUED] omeprazole (PRILOSEC) 20 MG capsule TAKE (1) CAPSULE DAILY    Allergies  Allergen  Reactions  . Tramadol Shortness Of Breath    And dizziness  . Trulicity [Dulaglutide] Swelling    Social History   Tobacco Use  . Smoking status: Former Smoker    Packs/day: 1.00    Years: 42.00    Pack years: 42.00    Types: Cigarettes    Last attempt to quit: 12/22/2017    Years since quitting: 0.3  . Smokeless tobacco:  Never Used  Substance Use Topics  . Alcohol use: No    Comment: rarely  . Drug use: No   Social History   Social History Narrative  . Not on file    family history includes Cancer in his brother; Dementia in his father; Diabetes in his brother and brother; Diabetes (age of onset: 83) in his mother; Heart attack (age of onset: 41) in his brother; Stroke in his mother; Testicular cancer in his brother.  Wt Readings from Last 3 Encounters:  05/09/18 207 lb (93.9 kg)  05/08/18 206 lb 6.4 oz (93.6 kg)  04/10/18 201 lb 9.6 oz (91.4 kg)    PHYSICAL EXAM BP 118/68   Pulse 82   Ht 6\' 4"  (1.93 m)   Wt 206 lb 6.4 oz (93.6 kg)   SpO2 97%   BMI 25.12 kg/m  Physical Exam  Constitutional: He appears well-developed and well-nourished. No distress.  Relatively well groomed.  Does appear quite older than stated age.  HENT:  Head: Normocephalic and atraumatic.  Neck: Normal range of motion. Neck supple. Hepatojugular reflux (Mild) present. No JVD present. Carotid bruit is present (Possible soft bilateral). No thyromegaly present.  Cardiovascular: Normal rate, regular rhythm, S1 normal and S2 normal.  Extrasystoles are present. PMI is not displaced. Exam reveals distant heart sounds and decreased pulses (Mildly reduced pedal). Exam reveals no gallop (Not loud enough heart sounds to hear) and no friction rub.  No murmur heard. Pulmonary/Chest: Effort normal. No respiratory distress. He has no rales (No rhonchi). He exhibits no tenderness.  Diffuse mild crackles with faint inspiratory and expiratory wheezing  Abdominal: Soft. Bowel sounds are normal. He exhibits no  distension. There is no abdominal tenderness.  No HSM  Vitals reviewed.    Adult ECG Report Not checked  Other studies Reviewed: Additional studies/ records that were reviewed today include:  Recent Labs:   Lab Results  Component Value Date   CHOL 110 03/16/2018   HDL 38 (L) 03/16/2018   LDLCALC 43 03/16/2018   TRIG 146 03/16/2018   CHOLHDL 2.9 03/16/2018   Lab Results  Component Value Date   CREATININE 0.91 01/25/2018   BUN 24 (H) 01/25/2018   NA 136 01/25/2018   K 3.9 01/25/2018   CL 103 01/25/2018   CO2 23 01/25/2018    ASSESSMENT / PLAN: Problem List Items Addressed This Visit    Coronary artery disease involving native coronary artery of native heart with angina pectoris ([place]) - Primary (Chronic)    Significant multivessel CAD with only 1 lesion that was amenable to PCI.  Not unexpectedly he probably does have some angina symptoms. He is on very low-dose of bisoprolol which I will change to the evening to avoid worsening orthostatic symptoms. Since he has not really thought about his nitroglycerin and probably should come up with a simply add Imdur 30 mg daily.  He is on dual antiplatelet therapy and on high-dose statin along with fenofibrate      Relevant Medications   isosorbide mononitrate (IMDUR) 30 MG 24 hr tablet   Essential hypertension (Chronic)    Not really hypotensive anymore, and since he is having orthostatic hypotension Emina have him reduce his lisinopril dose.  He has been taking 10 mg and I will have him simply go back to the 5 mg that was originally scheduled.  Low threshold and reducing it further if symptomatic.   We will also change his bisoprolol dose times 2 PM.  Relevant Medications   isosorbide mononitrate (IMDUR) 30 MG 24 hr tablet   Hyperlipidemia associated with type 2 diabetes mellitus (HCC) (Chronic)    Lipid panel was just checked and his LDL seemed quite good.  I think at this point with him having myalgia symptoms on  atorvastatin and fenofibrate Micronase will be to reduce atorvastatin.  In fact I will have him hold it for 2 weeks.  If his symptoms do improve, we can restart at 40 mg, if not after 2 weeks would restart statin but with rosuvastatin at 40 mg. However if his symptoms do not change then he can go back to the 80 mg dose.  Check labs in February or March.      Relevant Orders   Lipid panel   Comprehensive metabolic panel   Presence of drug coated stent in ramus intermedius coronary artery (Chronic)    Currently on aspirin plus Brilinta.  He seems to be tolerating Brilinta relatively well.  Would continue uninterrupted if possible for a year at which time I would reduce Brilinta to 60 mg twice daily.  Would probably be okay as of now to hold aspirin if necessary for bleeding or bruising.  If absolutely necessary, at 6 months would be okay to potentially hold Brilinta for procedures but go back on it.  After 1 year would be acceptable without question.      Stable angina (HCC) (Chronic)    Still having intermittent chest discomfort which I really do not understand fully, but because he does have existing disease elsewhere that is not amenable to PCI, I will add Imdur 30 mg daily.  He was not wanting to do that on last visit, but I think it is necessary.      Relevant Medications   isosorbide mononitrate (IMDUR) 30 MG 24 hr tablet   Tobacco use disorder (Chronic)    I congratulated him on his efforts of quitting smoking.         I spent a total of 45 minutes with the patient and chart review. >  50% of the time was spent in direct patient consultation.  Long discussion describing how he presented with what was probably an MI on 30 August and then post infarct angina the next morning relating to his urgent catheterization.  He was very tearful asked several questions about this.  He asked about his prognosis.  He asked about restrictions etc.  Multiple questions were answered.  Current  medicines are reviewed at length with the patient today.  (+/- concerns) notes dizziness and intermittent chest pain The following changes have been made:  See below  Patient Instructions  Medication Instructions:  Hold your atorvastatin for 2 weeks --if muscle pains are better, restart 40 mg daily (1/2 tablet) --if muscle pains are not better, restart 80 mg daily (1 tablet)  DECREASE Lisinopril to 5 mg daily START isosorbide (Imdur) 30 mg daily at night (take 30 mins after aspirin)  Night time medications: Aspirin, bisoprolol, metformin, and isosorbide (Imdur)  If you need a refill on your cardiac medications before your next appointment, please call your pharmacy.   Lab work: Please return for FASTING labs in Feb/March (CMET,Lipid)  Our in office lab hours are Monday-Friday 8:00-4:00, closed for lunch 12:45-1:45 pm.  No appointment needed.  If you have labs (blood work) drawn today and your tests are completely normal, you will receive your results only by: Marland Kitchen MyChart Message (if you have MyChart) OR . A paper copy in  the mail If you have any lab test that is abnormal or we need to change your treatment, we will call you to review the results.  Follow-Up: At [facility], you and your health needs are our priority.  As part of our continuing mission to provide you with exceptional heart care, we have created designated Provider Care Teams.  These Care Teams include your primary Cardiologist (physician) and Advanced Practice Providers (APPs -  Physician Assistants and Nurse Practitioners) who all work together to provide you with the care you need, when you need it. You will need a follow up appointment in 3 months.  Please call our office 2 months in advance to schedule this appointment.  You may see Glenetta Hew, MD or one of the following Advanced Practice Providers on your designated Care Team:   Rosaria Ferries, PA-C . Jory Sims, DNP, ANP      Studies Ordered:    Orders Placed This Encounter  Procedures  . Lipid panel  . Comprehensive metabolic panel      Glenetta Hew, M.D., M.S. Interventional Cardiologist   Pager # [phone] Phone # [phone] [address] [place], [place] [address]   Thank you for choosing Heartcare at [facility]!!

## 2018-05-08 NOTE — Patient Outreach (Signed)
[facility]) Care Management  05/08/2018  Andrew [name] 1951-03-01 [id number]    Follow up call placed to [employer] regarding patient assistance application for [facility] and [employer] confirmed that attestation form was received and patient has been approved as of 05/02/18 until 05/24/2019. Patient should received medication in 10-14 business days.   Will follow up with patient in 10-14 business days to confirm medication has been received.  Andrew [name] Chana Bode [place] Certified Pharmacy Technician [place] Management Direct Dial:[phone]

## 2018-05-09 ENCOUNTER — Encounter: Payer: Self-pay | Admitting: *Deleted

## 2018-05-09 ENCOUNTER — Other Ambulatory Visit: Payer: Self-pay | Admitting: *Deleted

## 2018-05-09 NOTE — Patient Outreach (Signed)
[facility]) Care Management   05/09/2018  Kathan Kirker [birth year] [id number]  Andrew [name] is an 67 y.o. male  Subjective: Today is my last home visit and case closure for Mr. Crescenzo. He is doing very well. He had a cold last week but this has resolved. He is still abstaining from smoking. He has completed a MOST form with me and he does NOT want to be resuscitated. I am not sure if his daughter has assisted him to complete his living will and HCPOA yet.  He is checking his glucose several times a week. He has not been to his diabetes counseling session yet but that is scheduled for January. He is complying the best he can on his diet. He is taking his meds as ordered. His daughter oversees all of his medical needs.  He say his cardiologist yesterday. His lisinopril was reduced to 5 mg and he was started on Isosorbide 30 mg q hs.  His atorvastatin is on hold for 2 weeks to see if his LE pain is relieved. (I believe his pain is neuropathy). Objective:  BP 130/70   Pulse 67   Resp 18   Wt 207 lb (93.9 kg)   SpO2 98%   BMI 25.20 kg/m  Glucose levels running between 110-167 am & pm.   Review of Systems  Respiratory: Negative.   Cardiovascular: Negative.   Neurological: Positive for sensory change.       Pt describes peripheral neuropathy pain vs myalgias    Physical Exam  Constitutional: He is oriented to person, place, and time. He appears well-developed and well-nourished.  Cardiovascular: Normal rate, regular rhythm and normal heart sounds.  Respiratory: Effort normal and breath sounds normal.  Musculoskeletal: Normal range of motion.  Neurological: He is alert and oriented to person, place, and time.  Skin: Skin is warm and dry.  A few abrasions on LLE.  Psychiatric: He has a normal mood and affect.    Encounter Medications:   Outpatient Encounter Medications as of 05/09/2018  Medication Sig Note  . aspirin 81 MG tablet Take 81 mg by mouth daily.   Marland Kitchen  atorvastatin (LIPITOR) 80 MG tablet Take 1 tablet (80 mg total) by mouth daily at 6 PM. (Patient not taking: Reported on 05/09/2018)   . bisoprolol (ZEBETA) 5 MG tablet Take 0.5 tablets (2.5 mg total) by mouth daily.   . budesonide-formoterol (SYMBICORT) 80-4.5 MCG/ACT inhaler Inhale 2 puffs into the lungs 2 (two) times daily.   Marland Kitchen buPROPion (WELLBUTRIN SR) 150 MG 12 hr tablet Take 1 tablet (150 mg total) by mouth 2 (two) times daily.   . empagliflozin (JARDIANCE) 25 MG TABS tablet Take 25 mg by mouth daily.   . fenofibrate (TRICOR) 48 MG tablet Take 1 tablet (48 mg total) by mouth daily.   Marland Kitchen HYDROCODONE-ACETAMINOPHEN PO Take by mouth.   . isosorbide mononitrate (IMDUR) 30 MG 24 hr tablet Take 1 tablet (30 mg total) by mouth daily.   Marland Kitchen lisinopril (PRINIVIL,ZESTRIL) 5 MG tablet Take 1 tablet (5 mg total) by mouth daily.   . metFORMIN (GLUCOPHAGE) 1000 MG tablet Take 1 tablet by mouth 2 times daily with a meal.   . nitroGLYCERIN (NITROSTAT) 0.4 MG SL tablet Place 1 tablet (0.4 mg total) under the tongue every 5 (five) minutes as needed for chest pain.   Marland Kitchen omeprazole (PRILOSEC) 20 MG capsule TAKE (1) CAPSULE DAILY   . primidone (MYSOLINE) 50 MG tablet Take 1 tablet (50 mg total) by mouth  4 (four) times daily.   . sitaGLIPtin (JANUVIA) 100 MG tablet Take 1 tablet (100 mg total) by mouth daily. 01/21/2018: Patient currently using samples as insurance will not cover medication  . tamsulosin (FLOMAX) 0.4 MG CAPS capsule Take 1 capsule (0.4 mg total) by mouth daily.   . ticagrelor (BRILINTA) 90 MG TABS tablet Take 1 tablet (90 mg total) by mouth 2 (two) times daily.    No facility-administered encounter medications on file as of 05/09/2018.     Functional Status:   In your present state of health, do you have any difficulty performing the following activities: 02/10/2018 01/21/2018  Hearing? N N  Comment - -  Vision? N N  Comment - -  Difficulty concentrating or making decisions? Y N  Comment - -   Walking or climbing stairs? Y Y  Comment - -  Dressing or bathing? N N  Doing errands, shopping? Y N  Preparing Food and eating ? Y -  Using the Toilet? N -  In the past six months, have you accidently leaked urine? Y -  Do you have problems with loss of bowel control? Y -  Managing your Medications? Y -  Managing your Finances? Y -  Housekeeping or managing your Housekeeping? N -  Some recent data might be hidden    Fall/Depression Screening:    Fall Risk  02/10/2018 01/06/2018 12/13/2017  Falls in the past year? - No No  Comment - - -  Number falls in past yr: - - -  Comment - - -  Injury with Fall? - - -  Comment - - -  Risk Factor Category  - - -  Risk for fall due to : History of fall(s);Impaired balance/gait - -  Follow up - - -   PHQ 2/9 Scores 04/10/2018 02/10/2018 01/06/2018 11/11/2017 10/06/2017 08/11/2017 07/07/2017  PHQ - 2 Score 0 6 0 1 0 1 0  PHQ- 9 Score - 16 - - - - -    THN CM Care Plan Problem One     Most Recent Value  Care Plan Problem One  Post STEMI (CAD)  Role Documenting the Problem One  Care Management Coordinator  Care Plan for Problem One  Active  THN Long Term Goal   Pt will demonstrate life style changes necessary to reduce his chances of further cardiac damage over the next 60 days.  THN Long Term Goal Start Date  02/13/18  THN Long Term Goal Met Date  03/27/18  [facility]N CM Short Term Goal #1   Pt will continue to abstain from smoking over the next 30 days.  THN CM Short Term Goal #1 Start Date  02/13/18  [facility] CM Short Term Goal #1 Met Date  03/27/18  THN CM Short Term Goal #2   Medication compliance  THN CM Short Term Goal #2 Start Date  02/13/18  THN CM Short Term Goal #2 Met Date  03/27/18  THN CM Short Term Goal #3  Pt will demonstrate improved dietary choices for CAD and diabetes as reported by pt and daughter over the next 30 days.  THN CM Short Term Goal #3 Start Date  02/13/18  [facility] CM Short Term Goal #3 Met Date  03/27/18    [facility] CM Care Plan  Problem Two     Most Recent Value  Care Plan Problem Two  No Advanced Directives  Role Documenting the Problem Two  Care Management Coordinator  Care Plan for Problem Two  Active  THN CM Short Term Goal #1   Pt will compled Advanced Directives and MOST form over the next 30 days.  THN CM Short Term Goal #1 Start Date  02/13/18  [facility] CM Short Term Goal #1 Met Date   05/09/18  Interventions for Short Term Goal #2   Advised to ensure copy gets in the medical record.    THN CM Care Plan Problem Three     Most Recent Value  Care Plan Problem Three  Needs further education regarding the diabetic diet  Role Documenting the Problem Three  Care Management Coordinator  Care Plan for Problem Three  Active  THN CM Short Term Goal #1   Pt and daughter to attend diabetes nutrition counseling within the next 30 days.  THN CM Short Term Goal #1 Start Date  04/11/18  [facility] CM Short Term Goal #1 Met Date  05/09/18  Interventions for Short Term Goal #1  Praised for following recommendations.     Assessment:  CAD                          DM - fair control  Plan: Last home visit today, closing pt case. He knows he can call me anytime in the future for issues.  Eulah Pont. Myrtie Neither, MSN, [facility] Gerontological Nurse Practitioner [facility] Care Management [phone]

## 2018-05-11 ENCOUNTER — Other Ambulatory Visit: Payer: Self-pay | Admitting: Pediatrics

## 2018-05-11 DIAGNOSIS — M79676 Pain in unspecified toe(s): Secondary | ICD-10-CM | POA: Diagnosis not present

## 2018-05-11 DIAGNOSIS — B351 Tinea unguium: Secondary | ICD-10-CM | POA: Diagnosis not present

## 2018-05-11 DIAGNOSIS — L84 Corns and callosities: Secondary | ICD-10-CM | POA: Diagnosis not present

## 2018-05-11 DIAGNOSIS — E1142 Type 2 diabetes mellitus with diabetic polyneuropathy: Secondary | ICD-10-CM | POA: Diagnosis not present

## 2018-05-11 DIAGNOSIS — K219 Gastro-esophageal reflux disease without esophagitis: Secondary | ICD-10-CM

## 2018-05-11 DIAGNOSIS — E119 Type 2 diabetes mellitus without complications: Secondary | ICD-10-CM

## 2018-05-12 ENCOUNTER — Encounter: Payer: Self-pay | Admitting: Cardiology

## 2018-05-12 ENCOUNTER — Other Ambulatory Visit: Payer: Self-pay | Admitting: *Deleted

## 2018-05-12 DIAGNOSIS — Z955 Presence of coronary angioplasty implant and graft: Secondary | ICD-10-CM | POA: Insufficient documentation

## 2018-05-12 NOTE — Assessment & Plan Note (Addendum)
Lipid panel was just checked and his LDL seemed quite good.  I think at this point with him having myalgia symptoms on atorvastatin and fenofibrate Micronase will be to reduce atorvastatin.  In fact I will have him hold it for 2 weeks.  If his symptoms do improve, we can restart at 40 mg, if not after 2 weeks would restart statin but with rosuvastatin at 40 mg. However if his symptoms do not change then he can go back to the 80 mg dose.  Check labs in February or March.

## 2018-05-12 NOTE — Assessment & Plan Note (Signed)
Currently on aspirin plus Brilinta.  He seems to be tolerating Brilinta relatively well.  Would continue uninterrupted if possible for a year at which time I would reduce Brilinta to 60 mg twice daily.  Would probably be okay as of now to hold aspirin if necessary for bleeding or bruising.  If absolutely necessary, at 6 months would be okay to potentially hold Brilinta for procedures but go back on it.  After 1 year would be acceptable without question.

## 2018-05-12 NOTE — Assessment & Plan Note (Signed)
Not really hypotensive anymore, and since he is having orthostatic hypotension Emina have him reduce his lisinopril dose.  He has been taking 10 mg and I will have him simply go back to the 5 mg that was originally scheduled.  Low threshold and reducing it further if symptomatic.   We will also change his bisoprolol dose times 2 PM.

## 2018-05-12 NOTE — Assessment & Plan Note (Signed)
I congratulated him on his efforts of quitting smoking.

## 2018-05-12 NOTE — Assessment & Plan Note (Signed)
Still having intermittent chest discomfort which I really do not understand fully, but because he does have existing disease elsewhere that is not amenable to PCI, I will add Imdur 30 mg daily.  He was not wanting to do that on last visit, but I think it is necessary.

## 2018-05-12 NOTE — Assessment & Plan Note (Signed)
Significant multivessel CAD with only 1 lesion that was amenable to PCI.  Not unexpectedly he probably does have some angina symptoms. He is on very low-dose of bisoprolol which I will change to the evening to avoid worsening orthostatic symptoms. Since he has not really thought about his nitroglycerin and probably should come up with a simply add Imdur 30 mg daily.  He is on dual antiplatelet therapy and on high-dose statin along with fenofibrate

## 2018-05-15 MED ORDER — BISOPROLOL FUMARATE 5 MG PO TABS: 2.5000 mg | ORAL_TABLET | Freq: Every day | ORAL | 1 refills | Status: DC

## 2018-06-01 ENCOUNTER — Encounter: Payer: Medicare Other | Attending: Pediatrics | Admitting: Nutrition

## 2018-06-01 ENCOUNTER — Encounter: Payer: Self-pay | Admitting: Nutrition

## 2018-06-01 VITALS — Ht 76.0 in | Wt 214.0 lb

## 2018-06-01 DIAGNOSIS — IMO0002 Reserved for concepts with insufficient information to code with codable children: Secondary | ICD-10-CM

## 2018-06-01 DIAGNOSIS — E1165 Type 2 diabetes mellitus with hyperglycemia: Secondary | ICD-10-CM | POA: Diagnosis not present

## 2018-06-01 DIAGNOSIS — I252 Old myocardial infarction: Secondary | ICD-10-CM

## 2018-06-01 DIAGNOSIS — E118 Type 2 diabetes mellitus with unspecified complications: Secondary | ICD-10-CM | POA: Insufficient documentation

## 2018-06-01 NOTE — Patient Instructions (Signed)
Goals  Eat three meals per day at times discussed  Eat 4 carb choices per meal  Drink water only  Cut out eating after supper unless veggies  Cut out using bacon grease and saturated fats  Test blood sugars BID. Get A1C to 7% or less.

## 2018-06-01 NOTE — Progress Notes (Signed)
  Medical Nutrition Therapy:  Appt start time: 0930 end time:  1030.   Assessment:  Primary concerns today: Diabetes Type 2. PMH Heart Attack with stents and angioplasty.  Lives by himself. His daughter is here with him today. Wants to get his BS down. Doesn't want to go on insulin. Eat 2 main meals. Admits to eating late at night after supper. Soesn't usually take time to eat lunch often. Grazes during day. Hungry after supper. Doesn't cook often. 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 Very active.  Has back, legs and ankle issues. Once a day sometimes. Hasn't been testing recently  FBS 160-170's.  On Jardiance, Januvia and Metformin. Motivated and engaged to make changes with diet and eat better balanced meals. Willing to test more often to evaluate blood sugar control. His daughter will help with some meal preparation.   Lab Results  Component Value Date   HGBA1C 7.7 (H) 04/10/2018   CMP Latest Ref Rng & Units 01/25/2018 01/23/2018 01/22/2018  Glucose 70 - 99 mg/dL 193(H) 223(H) 277(H)  BUN 8 - 23 mg/dL 24(H) 25(H) 19  Creatinine 0.61 - 1.24 mg/dL 0.91 1.03 0.81  Sodium 135 - 145 mmol/L 136 139 136  Potassium 3.5 - 5.1 mmol/L 3.9 4.6 4.7  Chloride 98 - 111 mmol/L 103 104 104  CO2 22 - 32 mmol/L 23 27 22   Calcium 8.9 - 10.3 mg/dL 9.1 9.7 9.4  Total Protein 6.5 - 8.1 g/dL - - -  Total Bilirubin 0.3 - 1.2 mg/dL - - -  Alkaline Phos 38 - 126 U/L - - -  AST 15 - 41 U/L - - -  ALT 0 - 44 U/L - - -     Preferred Learning Style:    No preference indicated    Learning Readiness:   Ready  Change in progress   MEDICATIONS:   DIETARY INTAKE:  Eats 2- meals per day and grazes over lunch. Eats a lot after supper before bed. Does  like cooking foods in bacon grease. Cooks some.   Usual physical activity:  Estimated energy needs: 1800-2000  calories 200  g carbohydrates 135  g protein 50 g fat  Progress Towards Goal(s):  In  progress.   Nutritional Diagnosis:  NB-1.1 Food and nutrition-related knowledge deficit As related to Diabetes.  As evidenced by A1C 7.7%.    Intervention:  Nutrition and Diabetes education provided on My Plate, CHO counting, meal planning, portion sizes, timing of meals, avoiding snacks between meals unless having a low blood sugar, target ranges for A1C and blood sugars, signs/symptoms and treatment of hyper/hypoglycemia, monitoring blood sugars, taking medications as prescribed, benefits of exercising 30 minutes per day and prevention of complications of DM. Marland Kitchen  Teaching Method Utilized:  Visual Auditory Hands on  Handouts given during visit include:  The Plate Method  Meal Plan Card   Barriers to learning/adherence to lifestyle change: none  Demonstrated degree of understanding via:  Teach Back   Monitoring/Evaluation:  Dietary intake, exercise, meal planning, and body weight in 2 month(s).

## 2018-06-05 ENCOUNTER — Telehealth: Payer: Self-pay | Admitting: Cardiology

## 2018-06-05 ENCOUNTER — Other Ambulatory Visit: Payer: Self-pay | Admitting: Pharmacy Technician

## 2018-06-05 ENCOUNTER — Other Ambulatory Visit: Payer: Self-pay

## 2018-06-05 NOTE — Patient Outreach (Signed)
[facility]) Care Management [place]  06/05/2018  Alma Muegge [birth year] [id number]  Reason for referral: medication assitance  [facility] pharmacy case is being closed due to the following reasons:   Goals have been met.  He was approved for Norlene Campbell and  Januvia patient assistance.   Patient has been provided [facility] CM contact information if assistance needed in the future.    Thank you for allowing [facility] pharmacy to be involved in this patient's care.    [name], PharmD Clinical Pharmacist [place] [phone]

## 2018-06-05 NOTE — Telephone Encounter (Signed)
Patient calling the office for samples of medication:   1.  What medication and dosage are you requesting samples for? Brilinta  2.  Are you currently out of this medication?  3 left

## 2018-06-05 NOTE — Patient Outreach (Signed)
[facility]) Care Management  06/05/2018  Bessie Livingood 11/10/1950 [id number]   Successful call placed to patient regarding patient assistance medication receipt from [place], HIPAA identifiers verified. Mr. Andreoli confirms that he received his Dulera and Januvia from [employer] patient assistance. Reviewed with patient how to obtain refills from [employer] as well as B-I for his Jardiance. Patient inquired about programs for Brilinta. I informed him that [employer] has a requirement that patients spend 3% of income before applying. Informed patient that he can call me me when he has reached about $600 OOP so that we can start the application process, also requested that he contact me if he has any issues with getting his refills.  Will route note to [place] for case closure.   Maud Deed Chana Bode [place] Certified Pharmacy Technician [facility] Management Direct [phone]

## 2018-06-05 NOTE — Telephone Encounter (Signed)
Returned pt call. Lmom. Samples of Brilinta will be left at the front desk for pick up. Pt is to contact the office if any questions or concerns.

## 2018-06-21 ENCOUNTER — Telehealth: Payer: Self-pay | Admitting: Cardiology

## 2018-06-21 MED ORDER — TICAGRELOR 90 MG PO TABS: 90.0000 mg | ORAL_TABLET | Freq: Two times a day (BID) | ORAL | 3 refills | Status: DC

## 2018-06-21 NOTE — Telephone Encounter (Signed)
PATIENT AWARE SAMPLES ARE AVAILABLE FOR PICK UP  X 5

## 2018-06-21 NOTE — Telephone Encounter (Signed)
New message   Patient calling the office for samples of medication:   1.  What medication and dosage are you requesting samples for? Brilinta   2.  Are you currently out of this medication? No, patient has 3 days of medication left

## 2018-06-25 IMAGING — DX DG LUMBAR SPINE 2-3V
2 series · 2 of 2 positions shown · non-contrast
Comparison: None.

CLINICAL DATA: 65-year-old male with lumbar back pain for 3 weeks.
No known injury. Initial encounter.

EXAM:
LUMBAR SPINE - 2-3 VIEW

[l-spine ap]
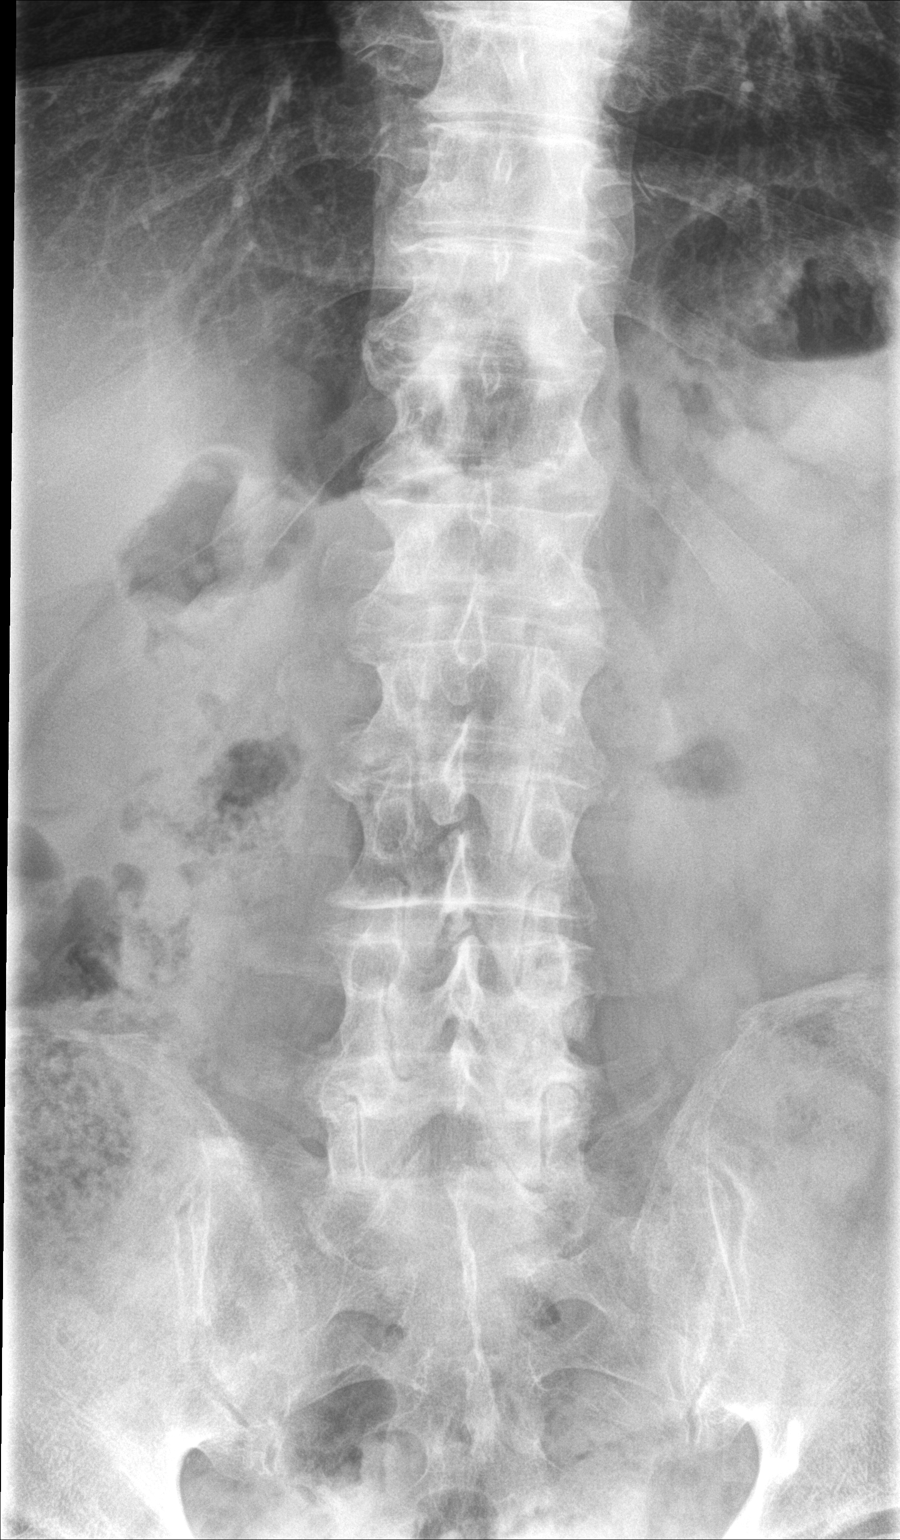

[l-spine lat]
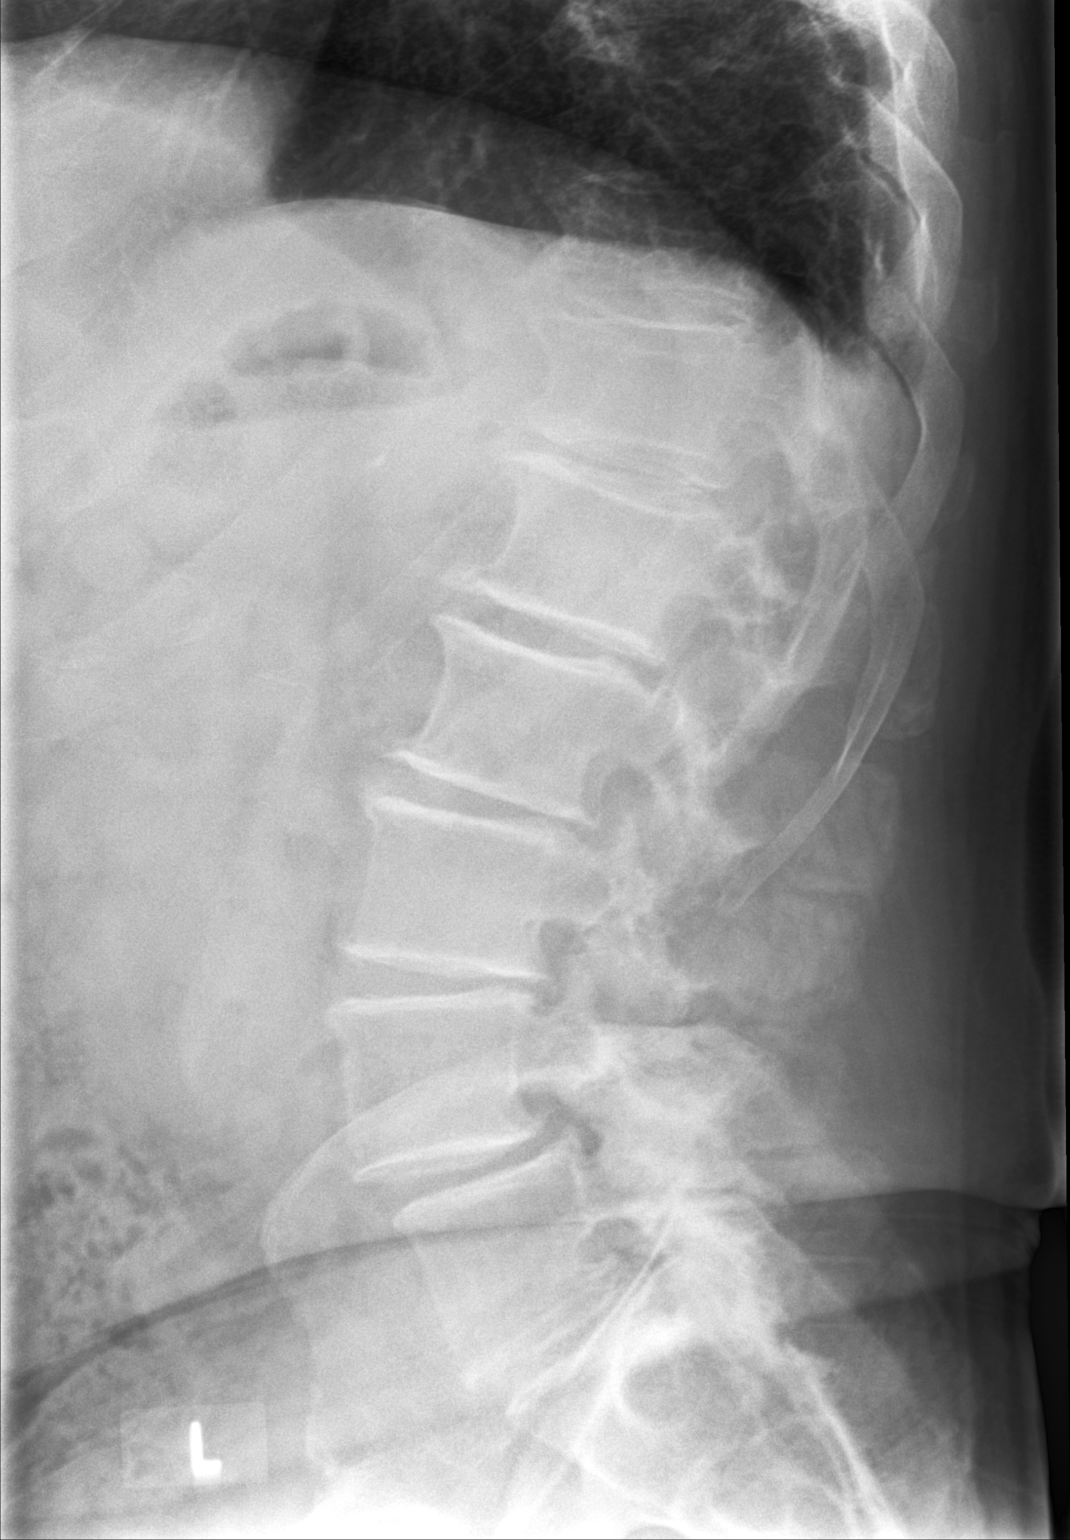

[2 of 2 positions shown; findings below may reference images not displayed]

FINDINGS: Normal lumbar segmentation. Bone mineralization is within normal
limits. Grade 1 anterolisthesis of L4 on L5. Trace retrolisthesis in
the upper lumbar spine from L1-L3. Moderate disc space loss and
degenerative endplate spurring throughout. Moderate to severe facet
hypertrophy from L2 to the sacrum, maximal at L4-L5. Visible lower
thoracic levels are intact. Sacral ale a and SI joints appear
normal. Mild calcified abdominal aortic atherosclerosis.
IMPRESSION: 1.  No acute osseous abnormality identified in the lumbar spine.
2. Grade 1 anterolisthesis at L4-L5 with moderate severe facet
degeneration.
3. Wet spread lumbar disc, endplate, and facet degeneration.
4.  Calcified aortic atherosclerosis.

## 2018-07-10 ENCOUNTER — Ambulatory Visit (INDEPENDENT_AMBULATORY_CARE_PROVIDER_SITE_OTHER): Payer: Medicare Other | Admitting: Family Medicine

## 2018-07-10 ENCOUNTER — Encounter: Payer: Self-pay | Admitting: Family Medicine

## 2018-07-10 VITALS — BP 102/63 | HR 81 | Temp 97.7°F | Ht 76.0 in | Wt 208.4 lb

## 2018-07-10 DIAGNOSIS — E119 Type 2 diabetes mellitus without complications: Secondary | ICD-10-CM | POA: Diagnosis not present

## 2018-07-10 DIAGNOSIS — E1169 Type 2 diabetes mellitus with other specified complication: Secondary | ICD-10-CM | POA: Diagnosis not present

## 2018-07-10 DIAGNOSIS — I10 Essential (primary) hypertension: Secondary | ICD-10-CM | POA: Diagnosis not present

## 2018-07-10 DIAGNOSIS — I25119 Atherosclerotic heart disease of native coronary artery with unspecified angina pectoris: Secondary | ICD-10-CM

## 2018-07-10 DIAGNOSIS — K219 Gastro-esophageal reflux disease without esophagitis: Secondary | ICD-10-CM

## 2018-07-10 DIAGNOSIS — J441 Chronic obstructive pulmonary disease with (acute) exacerbation: Secondary | ICD-10-CM

## 2018-07-10 DIAGNOSIS — M1990 Unspecified osteoarthritis, unspecified site: Secondary | ICD-10-CM | POA: Diagnosis not present

## 2018-07-10 DIAGNOSIS — E785 Hyperlipidemia, unspecified: Secondary | ICD-10-CM | POA: Diagnosis not present

## 2018-07-10 MED ORDER — NITROGLYCERIN 0.4 MG SL SUBL: 0.4000 mg | SUBLINGUAL_TABLET | SUBLINGUAL | 1 refills | Status: DC | PRN

## 2018-07-10 MED ORDER — METFORMIN HCL 1000 MG PO TABS: ORAL_TABLET | ORAL | 3 refills | Status: DC

## 2018-07-10 MED ORDER — HYDROCODONE-ACETAMINOPHEN 5-325 MG PO TABS: 1.0000 | ORAL_TABLET | Freq: Two times a day (BID) | ORAL | 0 refills | Status: DC | PRN

## 2018-07-10 MED ORDER — ISOSORBIDE MONONITRATE ER 30 MG PO TB24: 15.0000 mg | ORAL_TABLET | Freq: Every day | ORAL | 1 refills | Status: DC

## 2018-07-10 NOTE — Progress Notes (Signed)
BP 102/63   Pulse 81   Temp 97.7 F (36.5 C) (Oral)   Ht '6\' 4"'  (1.93 m)   Wt 208 lb 6.4 oz (94.5 kg)   BMI 25.37 kg/m    Subjective:    Patient ID: Andrew [name], male    DOB: [birth year], 68 y.o.   MRN: [id number]  HPI: Andrew [name] is a 68 y.o. male presenting on 07/10/2018 for Diabetes (3 month follow up); Hypertension; Hyperlipidemia; and Establish Care [facility] patient )   HPI Type 2 diabetes mellitus Patient comes in today for recheck of his diabetes. Patient has been currently taking metformin and Januvia and Jardiance. Patient is currently on an ACE inhibitor/ARB. Patient has not seen an ophthalmologist this year. Patient denies any issues with their feet.  Patient has known coronary artery disease  Hypertension Patient is currently on bisoprolol and Imdur and lisinopril, and their blood pressure today is 102/63. Patient denies any lightheadedness or dizziness. Patient denies headaches, blurred vision, chest pains, shortness of breath, or weakness. Denies any side effects from medication and is content with current medication.   COPD Patient is coming in for COPD recheck today.  He is currently on Symbicort.  He has a mild chronic cough but denies any major coughing spells or wheezing spells.  He has 1nighttime symptoms per week and 1daytime symptoms per week currently.   GERD Patient is currently on omeprazole.  She denies any major symptoms or abdominal pain or belching or burping. She denies any blood in her stool or lightheadedness or dizziness.   Hyperlipidemia Patient is coming in for recheck of his hyperlipidemia. The patient is currently taking Lipitor. They deny any issues with myalgias or history of liver damage from it. They deny any focal numbness or weakness or chest pain.   Patient is currently under treatment for chronic arthritis in his hips and knees and back that he takes Norco for.  He currently is getting Norco 5-325 and gets 35/month so about 1/day which  puts him about 5-6 morphine equivalent daily dose.  Relevant past medical, surgical, family and social history reviewed and updated as indicated. Interim medical history since our last visit reviewed. Allergies and medications reviewed and updated.  Review of Systems  Constitutional: Negative for chills and fever.  Eyes: Negative for visual disturbance.  Respiratory: Negative for shortness of breath and wheezing.   Cardiovascular: Negative for chest pain and leg swelling.  Musculoskeletal: Positive for arthralgias. Negative for back pain, gait problem and joint swelling.  Skin: Negative for rash.  Neurological: Negative for dizziness, weakness, light-headedness and numbness.  All other systems reviewed and are negative.   Per HPI unless specifically indicated above   Allergies as of 07/10/2018      Reactions   Tramadol Shortness Of Breath   And dizziness   Trulicity [dulaglutide] Swelling      Medication List       Accurate as of July 10, 2018 11:59 PM. Always use your most recent med list.        aspirin 81 MG tablet Take 81 mg by mouth daily.   atorvastatin 80 MG tablet Commonly known as:  LIPITOR Take 1 tablet (80 mg total) by mouth daily at 6 PM.   bisoprolol 5 MG tablet Commonly known as:  ZEBETA Take 0.5 tablets (2.5 mg total) by mouth daily. Take at 2pm   budesonide-formoterol 80-4.5 MCG/ACT inhaler Commonly known as:  SYMBICORT Inhale 2 puffs into the lungs 2 (two) times daily.  empagliflozin 25 MG Tabs tablet Commonly known as:  JARDIANCE Take 25 mg by mouth daily.   fenofibrate 48 MG tablet Commonly known as:  TRICOR Take 1 tablet (48 mg total) by mouth daily.   HYDROcodone-acetaminophen 5-325 MG tablet Commonly known as:  NORCO/VICODIN Take 1 tablet by mouth 2 (two) times daily as needed for moderate pain.   HYDROcodone-acetaminophen 5-325 MG tablet Commonly known as:  NORCO/VICODIN Take 1 tablet by mouth 2 (two) times daily as needed for  moderate pain. Do not refill until 30 days from prescription date   HYDROcodone-acetaminophen 5-325 MG tablet Commonly known as:  NORCO/VICODIN Take 1 tablet by mouth 2 (two) times daily as needed for moderate pain. Do not refill until 60 days from prescription date   isosorbide mononitrate 30 MG 24 hr tablet Commonly known as:  IMDUR Take 0.5 tablets (15 mg total) by mouth daily.   lisinopril 5 MG tablet Commonly known as:  PRINIVIL,ZESTRIL Take 1 tablet (5 mg total) by mouth daily.   metFORMIN 1000 MG tablet Commonly known as:  GLUCOPHAGE Take 1 tablet by mouth 2 times daily with a meal.   nitroGLYCERIN 0.4 MG SL tablet Commonly known as:  NITROSTAT Place 1 tablet (0.4 mg total) under the tongue every 5 (five) minutes as needed for chest pain.   omeprazole 20 MG capsule Commonly known as:  PRILOSEC TAKE (1) CAPSULE DAILY   primidone 50 MG tablet Commonly known as:  MYSOLINE Take 1 tablet (50 mg total) by mouth 4 (four) times daily.   sitaGLIPtin 100 MG tablet Commonly known as:  JANUVIA Take 1 tablet (100 mg total) by mouth daily.   tamsulosin 0.4 MG Caps capsule Commonly known as:  FLOMAX Take 1 capsule (0.4 mg total) by mouth daily.   ticagrelor 90 MG Tabs tablet Commonly known as:  BRILINTA Take 1 tablet (90 mg total) by mouth 2 (two) times daily.          Objective:    BP 102/63   Pulse 81   Temp 97.7 F (36.5 C) (Oral)   Ht '6\' 4"'  (1.93 m)   Wt 208 lb 6.4 oz (94.5 kg)   BMI 25.37 kg/m   Wt Readings from Last 3 Encounters:  07/10/18 208 lb 6.4 oz (94.5 kg)  06/01/18 214 lb (97.1 kg)  05/09/18 207 lb (93.9 kg)    Physical Exam Vitals signs and nursing note reviewed.  Constitutional:      General: He is not in acute distress.    Appearance: He is well-developed. He is not diaphoretic.  Eyes:     General: No scleral icterus.    Conjunctiva/sclera: Conjunctivae normal.  Neck:     Musculoskeletal: Neck supple.     Thyroid: No thyromegaly.    Cardiovascular:     Rate and Rhythm: Normal rate and regular rhythm.     Heart sounds: Normal heart sounds. No murmur.  Pulmonary:     Effort: Pulmonary effort is normal. No respiratory distress.     Breath sounds: Normal breath sounds. No wheezing.  Musculoskeletal: Normal range of motion.  Lymphadenopathy:     Cervical: No cervical adenopathy.  Skin:    General: Skin is warm and dry.     Findings: No rash.  Neurological:     Mental Status: He is alert and oriented to person, place, and time.     Coordination: Coordination normal.  Psychiatric:        Behavior: Behavior normal.  Assessment & Plan:   Problem List Items Addressed This Visit      Cardiovascular and Mediastinum   Essential hypertension (Chronic)   Relevant Medications   isosorbide mononitrate (IMDUR) 30 MG 24 hr tablet   nitroGLYCERIN (NITROSTAT) 0.4 MG SL tablet   Other Relevant Orders   Lipid panel   CMP14+EGFR   Coronary artery disease involving native coronary artery of native heart with angina pectoris (HCC) (Chronic)   Relevant Medications   isosorbide mononitrate (IMDUR) 30 MG 24 hr tablet   nitroGLYCERIN (NITROSTAT) 0.4 MG SL tablet     Respiratory   COPD with acute exacerbation (HCC)     Digestive   Esophageal reflux   Relevant Orders   CBC with Differential/Platelet     Endocrine   Type 2 diabetes mellitus (HCC) - Primary (Chronic)   Relevant Medications   metFORMIN (GLUCOPHAGE) 1000 MG tablet   Other Relevant Orders   Bayer DCA Hb A1c Waived   Hyperlipidemia associated with type 2 diabetes mellitus (HCC) (Chronic)   Relevant Medications   metFORMIN (GLUCOPHAGE) 1000 MG tablet    Other Visit Diagnoses    Arthritis       Relevant Medications   HYDROcodone-acetaminophen (NORCO/VICODIN) 5-325 MG tablet   HYDROcodone-acetaminophen (NORCO/VICODIN) 5-325 MG tablet   HYDROcodone-acetaminophen (NORCO/VICODIN) 5-325 MG tablet      Continue Imdur but cut it in half and take only  half a daily and continue metformin and other medications for diabetes and blood pressure.  Patient's blood pressure is on the lower side so that is why we are cutting the Imdur in half.  Follow up plan: Return in about 3 months (around 10/08/2018), or if symptoms worsen or fail to improve, for type 2 dm, copd.  Counseling provided for all of the vaccine components Orders Placed This Encounter  Procedures  . Bayer DCA Hb A1c Waived  . Lipid panel  . CMP14+EGFR  . CBC with Differential/Platelet    Caryl Pina, MD [facility] 07/15/2018, 3:51 PM

## 2018-07-12 ENCOUNTER — Ambulatory Visit: Payer: Medicare Other | Admitting: Pediatrics

## 2018-07-13 ENCOUNTER — Telehealth: Payer: Self-pay | Admitting: Cardiology

## 2018-07-13 NOTE — Telephone Encounter (Signed)
° °  Patient calling the office for samples of medication:   1.  What medication and dosage are you requesting samples for? ticagrelor (BRILINTA) 90 MG TABS tablet  2.  Are you currently out of this medication? yes

## 2018-07-13 NOTE — Telephone Encounter (Signed)
Returned call to patient Brilinta 90 mg samples left at front desk.

## 2018-07-24 ENCOUNTER — Ambulatory Visit (INDEPENDENT_AMBULATORY_CARE_PROVIDER_SITE_OTHER): Payer: Medicare Other | Admitting: *Deleted

## 2018-07-24 VITALS — BP 103/63 | HR 69 | Ht 76.0 in | Wt 205.4 lb

## 2018-07-24 DIAGNOSIS — I10 Essential (primary) hypertension: Secondary | ICD-10-CM

## 2018-07-24 DIAGNOSIS — K219 Gastro-esophageal reflux disease without esophagitis: Secondary | ICD-10-CM

## 2018-07-24 DIAGNOSIS — E1169 Type 2 diabetes mellitus with other specified complication: Secondary | ICD-10-CM | POA: Diagnosis not present

## 2018-07-24 DIAGNOSIS — E119 Type 2 diabetes mellitus without complications: Secondary | ICD-10-CM

## 2018-07-24 DIAGNOSIS — Z0001 Encounter for general adult medical examination with abnormal findings: Secondary | ICD-10-CM | POA: Diagnosis not present

## 2018-07-24 DIAGNOSIS — E785 Hyperlipidemia, unspecified: Secondary | ICD-10-CM | POA: Diagnosis not present

## 2018-07-24 DIAGNOSIS — Z0000 Encounter for general adult medical examination without abnormal findings: Secondary | ICD-10-CM

## 2018-07-24 LAB — LIPID PANEL
CHOL/HDL RATIO: 3.1 ratio (ref 0.0–5.0)
Cholesterol, Total: 122 mg/dL (ref 100–199)
HDL: 40 mg/dL (ref >39)
LDL CALC: 63 mg/dL (ref 0–99)
Triglycerides: 95 mg/dL (ref 0–149)
VLDL Cholesterol Cal: 19 mg/dL (ref 5–40)

## 2018-07-24 LAB — COMPREHENSIVE METABOLIC PANEL
ALT: 15 IU/L (ref 0–44)
AST: 12 IU/L (ref 0–40)
Albumin/Globulin Ratio: 1.7 (ref 1.2–2.2)
Albumin: 4.4 g/dL (ref 3.8–4.8)
Alkaline Phosphatase: 68 IU/L (ref 39–117)
BUN/Creatinine Ratio: 18 (ref 10–24)
BUN: 15 mg/dL (ref 8–27)
Bilirubin Total: 0.4 mg/dL (ref 0.0–1.2)
CO2: 20 mmol/L (ref 20–29)
CREATININE: 0.84 mg/dL (ref 0.76–1.27)
Calcium: 9.8 mg/dL (ref 8.6–10.2)
Chloride: 99 mmol/L (ref 96–106)
GFR calc Af Amer: 104 mL/min/{1.73_m2} (ref >59)
GFR calc non Af Amer: 90 mL/min/{1.73_m2} (ref >59)
Globulin, Total: 2.6 g/dL (ref 1.5–4.5)
Glucose: 146 mg/dL — ABNORMAL HIGH (ref 65–99)
POTASSIUM: 5 mmol/L (ref 3.5–5.2)
Sodium: 135 mmol/L (ref 134–144)
Total Protein: 7 g/dL (ref 6.0–8.5)

## 2018-07-24 LAB — BAYER DCA HB A1C WAIVED: HB A1C: 7.4 % — AB (ref <7.0)

## 2018-07-24 NOTE — Progress Notes (Signed)
Subjective:   Andrew [name] is a 68 y.o. male who presents for Medicare Annual/Subsequent preventive examination. Mr.  Andrew [name] retired in 2016 from [employer] where he worked on [employer].  [name] lives at home alone with his Production assistant, radio.  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  He has 1 child and 2 grandchildren that also visits often.  He enjoys farming and riding horses.  [name] does not exercise and only eats one meal daily and snacks the rest of the day.  He had a heart attack in august, 2019 where they placed stents but has had no other hospitalizations or surgeries in the past year.  [name] feels that his health is overall better since heart attack in August, 2019.      Objective:    Vitals: BP 103/63   Pulse 69   Ht 6\' 4"  (1.93 m)   Wt 205 lb 6.4 oz (93.2 kg)   BMI 25.00 kg/m   Body mass index is 25 kg/m.  Advanced Directives 02/10/2018 01/20/2018 10/14/2017 08/11/2017 01/17/2017 01/14/2017 01/07/2017  Does Patient Have a Medical Advance Directive? No No Yes Yes Yes No Yes  Type of Advance Directive - - Public librarian;Living will Living will - Living will  Does patient want to make changes to medical advance directive? - - - No - Patient declined - - -  Copy of [place] in Chart? - - - No - copy requested - - -  Would patient like information on creating a medical advance directive? - - - - - - -    Tobacco Social History   Tobacco Use  Smoking Status Current Every Day Smoker  . Packs/day: 0.50  . Years: 42.00  . Pack years: 21.00  . Types: Cigarettes  Smokeless Tobacco Never Used     Ready to quit: Not Answered Counseling given: Not Answered   Clinical Intake:  Pre-visit preparation completed: No  Pain : No/denies pain     Nutritional Status: BMI 25 -29 Overweight Nutritional Risks: None Diabetes: Yes CBG done?: No Did pt. bring in CBG monitor from home?:  No  How often do you need to have someone help you when you read instructions, pamphlets, or other written materials from your doctor or pharmacy?: 1 - Never What is the last grade level you completed in school?: Some College  Interpreter Needed?: No  Information entered by :: Truett Mainland, LPN  Past Medical History:  Diagnosis Date  . Allergy   . Anemia   . Arthritis   . Cancer [facility])    Bladder  . Depression   . Diabetes mellitus without complication ([place])   . GERD (gastroesophageal reflux disease)   . Hyperlipidemia   . Hypertension    in the past no longer on medication  . Neuromuscular disorder ([place])   . Sleep apnea    BiPAP no used in 3 years  . Status post tendon repair 1989  . STEMI (ST elevation myocardial infarction) ([place]) 01/20/2018   Cardiac cath January 21, 2018: Severe multivessel disease with unclear lesion, but opted for PCI/DES x1 to the RI.  Appeared to have CTO of the distal LAD and circumflex as well as moderate mid LAD and diagonal disease as well as PDA.Marland Kitchen  Unable to cross circumflex lesion.  . Stroke ([place])    At times pt has dizziness with loss of vision  . Tremors  of nervous system 2011   Past Surgical History:  Procedure Laterality Date  . APPENDECTOMY    . BLADDER SURGERY    . COLONOSCOPY    . CORONARY STENT INTERVENTION N/A 01/21/2018   Procedure: CORONARY STENT INTERVENTION;  Surgeon: Leonie Man, MD;  Location: [place] [facility];  Service: Cardiovascular;  Laterality: N/A;  95% Ramus Intermedius -PCI with synergy DES 2.25 mm x 12 mm postdilated 2.4 mm.  [place] ACUTE MI REVASCULARIZATION N/A 01/21/2018   Procedure: Coronary/Graft Acute MI Revascularization;  Surgeon: Leonie Man, MD;  Location: [place] [facility];  Service: Cardiovascular;  Laterality: N/A;  attempted revas to distal CFX;   . CYSTOSCOPY W/ RETROGRADES Bilateral 08/22/2015   Procedure: CYSTOSCOPY WITH RETROGRADE PYELOGRAM;  Surgeon: Irine Seal, MD;  Location: [facility];  Service: Urology;  Laterality: Bilateral;  . CYSTOSCOPY W/ RETROGRADES Bilateral 01/16/2016   Procedure: CYSTOSCOPY WITH RETROGRADE PYELOGRAM;  Surgeon: Irine Seal, MD;  Location: [facility];  Service: Urology;  Laterality: Bilateral;  . CYSTOSCOPY W/ RETROGRADES Bilateral 01/07/2017   Procedure: CYSTOSCOPY WITH RETROGRADE PYELOGRAM;  Surgeon: Irine Seal, MD;  Location: [facility];  Service: Urology;  Laterality: Bilateral;  . CYSTOSCOPY WITH BIOPSY N/A 08/22/2015   Procedure: CYSTOSCOPY WITH BLADDER BIOPSY;  Surgeon: Irine Seal, MD;  Location: [facility];  Service: Urology;  Laterality: N/A;  . CYSTOSCOPY WITH BIOPSY N/A 01/16/2016   Procedure: CYSTOSCOPY WITH BLADDER BIOPSY;  Surgeon: Irine Seal, MD;  Location: [facility];  Service: Urology;  Laterality: N/A;  . CYSTOSCOPY WITH FULGERATION N/A 01/16/2016   Procedure: CYSTOSCOPY WITH FULGERATION;  Surgeon: Irine Seal, MD;  Location: [facility];  Service: Urology;  Laterality: N/A;  . KNEE ARTHROSCOPY Right 1989  . LEFT HEART CATH AND CORONARY ANGIOGRAPHY N/A 01/21/2018   Procedure: LEFT HEART CATH AND CORONARY ANGIOGRAPHY;  Surgeon: Leonie Man, MD;  Location: [place] [facility];  Service: Cardiovascular;; 95% RI-DES PCI.  80% o-pCX (PTCA) -> dCX 80%-100% CTO (unsuccessful PTCA unable to cross distal lesion with balloon.)  100% CTO dLAD. RPDA 80% and 70% as well as P AV 180%, PA V2 60%.  (too small for PCI)  . ROTATOR CUFF REPAIR Bilateral 2000  . rt. leg fracture surgery    . TRANSTHORACIC ECHOCARDIOGRAM  01/21/2018   Mild LVH.  EF 50 and 55%.  Apical inferior hypokinesis.  Mid-apical anterolateral hypokinesis.  Normal diastolic function for age   . TRANSURETHRAL RESECTION OF BLADDER TUMOR N/A 01/07/2017   Procedure: TRANSURETHRAL RESECTION OF BLADDER TUMOR (TURBT);  Surgeon: Irine Seal, MD;  Location: [facility];  Service: Urology;  Laterality: N/A;  . WISDOM TOOTH EXTRACTION     Family History  Problem Relation Age of Onset  . Diabetes Mother 3        type 1  . Stroke Mother   . Dementia Father   . Diabetes Brother   . Heart attack Brother 65  . Testicular cancer Brother   . Diabetes Brother   . Cancer Brother        testicular  . Colon cancer Neg Hx   . Colon polyps Neg Hx   . Esophageal cancer Neg Hx   . Rectal cancer Neg Hx   . Stomach cancer Neg Hx    Social History   Socioeconomic History  . Marital status: Divorced    Spouse name: Not on file  . Number of children: 1  . Years of education: some college  . Highest education level: Some  college, no degree  Occupational History  . Occupation: retired    Comment: Architect, some farming  Social Needs  . Financial resource strain: Not hard at all  . Food insecurity:    Worry: Never true    Inability: Never true  . Transportation needs:    Medical: No    Non-medical: No  Tobacco Use  . Smoking status: Current Every Day Smoker    Packs/day: 0.50    Years: 42.00    Pack years: 21.00    Types: Cigarettes  . Smokeless tobacco: Never Used  Substance and Sexual Activity  . Alcohol use: No    Comment: rarely  . Drug use: No  . Sexual activity: Never  Lifestyle  . Physical activity:    Days per week: 5 days    Minutes per session: 90 min  . Stress: Not at all  Relationships  . Social connections:    Talks on phone: More than three times a week    Gets together: Twice a week    Attends religious service: Never    Active member of club or organization: No    Attends meetings of clubs or organizations: Never    Relationship status: Divorced  Other Topics Concern  . Not on file  Social History Narrative  . Not on file    Outpatient Encounter Medications as of 07/24/2018  Medication Sig  . aspirin 81 MG tablet Take 81 mg by mouth daily.  Marland Kitchen atorvastatin (LIPITOR) 80 MG tablet Take 1 tablet (80 mg total) by mouth daily at 6 PM.  . bisoprolol (ZEBETA) 5 MG tablet Take 0.5 tablets (2.5 mg total) by mouth daily. Take at 2pm  . empagliflozin (JARDIANCE) 25 MG  TABS tablet Take 25 mg by mouth daily.  . fenofibrate (TRICOR) 48 MG tablet Take 1 tablet (48 mg total) by mouth daily.  Marland Kitchen HYDROcodone-acetaminophen (NORCO/VICODIN) 5-325 MG tablet Take 1 tablet by mouth 2 (two) times daily as needed for moderate pain.  Marland Kitchen HYDROcodone-acetaminophen (NORCO/VICODIN) 5-325 MG tablet Take 1 tablet by mouth 2 (two) times daily as needed for moderate pain. Do not refill until 30 days from prescription date  . HYDROcodone-acetaminophen (NORCO/VICODIN) 5-325 MG tablet Take 1 tablet by mouth 2 (two) times daily as needed for moderate pain. Do not refill until 60 days from prescription date  . isosorbide mononitrate (IMDUR) 30 MG 24 hr tablet Take 0.5 tablets (15 mg total) by mouth daily.  Marland Kitchen lisinopril (PRINIVIL,ZESTRIL) 5 MG tablet Take 1 tablet (5 mg total) by mouth daily.  . metFORMIN (GLUCOPHAGE) 1000 MG tablet Take 1 tablet by mouth 2 times daily with a meal.  . nitroGLYCERIN (NITROSTAT) 0.4 MG SL tablet Place 1 tablet (0.4 mg total) under the tongue every 5 (five) minutes as needed for chest pain.  Marland Kitchen omeprazole (PRILOSEC) 20 MG capsule TAKE (1) CAPSULE DAILY  . primidone (MYSOLINE) 50 MG tablet Take 1 tablet (50 mg total) by mouth 4 (four) times daily.  . sitaGLIPtin (JANUVIA) 100 MG tablet Take 1 tablet (100 mg total) by mouth daily.  . tamsulosin (FLOMAX) 0.4 MG CAPS capsule Take 1 capsule (0.4 mg total) by mouth daily.  . ticagrelor (BRILINTA) 90 MG TABS tablet Take 1 tablet (90 mg total) by mouth 2 (two) times daily.  . budesonide-formoterol (SYMBICORT) 80-4.5 MCG/ACT inhaler Inhale 2 puffs into the lungs 2 (two) times daily. (Patient not taking: Reported on 07/24/2018)   No facility-administered encounter medications on file as of 07/24/2018.  Activities of Daily Living In your present state of health, do you have any difficulty performing the following activities: 07/24/2018 02/10/2018  Hearing? Y [place]? Y N  Comment - -  Difficulty  concentrating or making decisions? Y Y  Comment - -  Walking or climbing stairs? Y Y  Comment - -  Dressing or bathing? N N  Doing errands, shopping? N Y  Conservation officer, nature and eating ? - Y  Using the Toilet? - N  In the past six months, have you accidently leaked urine? - Y  Do you have problems with loss of bowel control? - Y  Managing your Medications? - Y  Managing your Finances? - Y  Housekeeping or managing your Housekeeping? - N  Some recent data might be hidden    Patient Care Team: Dettinger, Fransisca Kaufmann, MD as PCP - General (Family Medicine) Leonie Man, MD as PCP - Cardiology (Cardiology) Irine Seal, MD as Attending Physician (Urology)   Assessment:   This is a routine wellness examination for [employer].  Exercise Activities and Dietary recommendations Current Exercise Habits: The patient does not participate in regular exercise at present  Goals    . Exercise 150 min/wk Moderate Activity    . Have 3 meals a day       Fall Risk Fall Risk  07/24/2018 06/01/2018 02/10/2018 01/06/2018 12/13/2017  Falls in the past year? 0 1 - No No  Comment - - - - -  Number falls in past yr: 0 1 - - -  Comment - - - - -  Injury with Fall? 0 - - - -  Comment - - - - -  Risk Factor Category  - - - - -  Risk for fall due to : - Impaired balance/gait History of fall(s);Impaired balance/gait - -  Follow up - - - - -   Is the patient's home free of loose throw rugs in walkways, pet beds, electrical cords, etc?   yes      Grab bars in the bathroom? yes      Handrails on the stairs?   yes      Adequate lighting?   yes  Timed Get Up and Go Performed:   Depression Screen PHQ 2/9 Scores 07/24/2018 07/10/2018 04/10/2018 02/10/2018  PHQ - 2 Score 0 0 0 6  PHQ- 9 Score - - - 16    Cognitive Function MMSE - Mini Mental State Exam 07/24/2018 08/11/2017 08/11/2017 08/10/2016  Orientation to time 5 5 5 5   Orientation to Place 5 5 5 5   Registration 3 3 3 3   Attention/ Calculation 5 5 5 4   Recall 3 3 3  2   Language- name 2 objects 2 2 2 2   Language- repeat 1 1 1 1   Language- follow 3 step command 3 3 3 3   Language- read & follow direction 1 1 1 1   Write a sentence 1 1 - 1  Copy design 1 0 - 1  Total score 30 29 - 28  No Memory loss noted at this time       Immunization History  Administered Date(s) Administered  . Influenza, High Dose Seasonal PF 02/25/2016  . Influenza,inj,Quad PF,6+ Mos 03/14/2017   Qualifies for Shingles Vaccine? Yes, Declined due to price   Screening Tests Health Maintenance  Topic Date Due  . FOOT EXAM  02/24/2017  . OPHTHALMOLOGY EXAM  07/05/2017  . INFLUENZA VACCINE  08/22/2018 (Originally 12/22/2017)  .  TETANUS/TDAP  07/24/2019 (Originally 01/22/1970)  . PNA vac Low Risk Adult (1 of 2 - PCV13) 07/24/2019 (Originally 01/23/2015)  . HEMOGLOBIN A1C  10/09/2018  . COLONOSCOPY  10/14/2020  . Hepatitis C Screening  Completed  Decline Tdap due to price Decline prevnar due to not feeling well.  Cancer Screenings: Lung: Low Dose CT Chest recommended if Age 83-80 years, 30 pack-year currently smoking OR have quit w/in 15years. Patient does qualify. Colorectal: Up to date   Additional Screenings: Hepatitis C Screening:      Plan:   Encouraged Mr. Kozma to try to exercise for at least 30 minutes, 3 times weekly.  Encouraged to try to eat 3 meals daily that consist of lean proteins, fruits, vegetables and low carb.  Diabetic diet information given.  Encouraged to look over Advance Directive packet and discuss with daughter.  Explained the importance of flu, shingles, pneumonia and tetanus vaccine.  Encouraged to keep follow up appointment with Dr. Warrick Parisian.  Encouraged to keep appointments with Dr. Ellyn Hack and Dr. Jeffie Pollock.    I have personally reviewed and noted the following in the patient's chart:   . Medical and social history . Use of alcohol, tobacco or illicit drugs  . Current medications and supplements . Functional ability and status . Nutritional  status . Physical activity . Advanced directives . List of other physicians . Hospitalizations, surgeries, and ER visits in previous 12 months . Vitals . Screenings to include cognitive, depression, and falls . Referrals and appointments  In addition, I have reviewed and discussed with patient certain preventive protocols, quality metrics, and best practice recommendations. A written personalized care plan for preventive services as well as general preventive health recommendations were provided to patient.     Wardell Heath, LPN  01/26/8015

## 2018-07-24 NOTE — Patient Instructions (Signed)
Diabetes Mellitus and Nutrition, Adult When you have diabetes (diabetes mellitus), it is very important to have healthy eating habits because your blood sugar (glucose) levels are greatly affected by what you eat and drink. Eating healthy foods in the appropriate amounts, at about the same times every day, can help you:  Control your blood glucose.  Lower your risk of heart disease.  Improve your blood pressure.  Reach or maintain a healthy weight. Every person with diabetes is different, and each person has different needs for a meal plan. Your health care provider may recommend that you work with a diet and nutrition specialist (dietitian) to make a meal plan that is best for you. Your meal plan may vary depending on factors such as:  The calories you need.  The medicines you take.  Your weight.  Your blood glucose, blood pressure, and cholesterol levels.  Your activity level.  Other health conditions you have, such as heart or kidney disease. How do carbohydrates affect me? Carbohydrates, also called carbs, affect your blood glucose level more than any other type of food. Eating carbs naturally raises the amount of glucose in your blood. Carb counting is a method for keeping track of how many carbs you eat. Counting carbs is important to keep your blood glucose at a healthy level, especially if you use insulin or take certain oral diabetes medicines. It is important to know how many carbs you can safely have in each meal. This is different for every person. Your dietitian can help you calculate how many carbs you should have at each meal and for each snack. Foods that contain carbs include:  Bread, cereal, rice, pasta, and crackers.  Potatoes and corn.  Peas, beans, and lentils.  Milk and yogurt.  Fruit and juice.  Desserts, such as cakes, cookies, ice cream, and candy. How does alcohol affect me? Alcohol can cause a sudden decrease in blood glucose (hypoglycemia),  especially if you use insulin or take certain oral diabetes medicines. Hypoglycemia can be a life-threatening condition. Symptoms of hypoglycemia (sleepiness, dizziness, and confusion) are similar to symptoms of having too much alcohol. If your health care provider says that alcohol is safe for you, follow these guidelines:  Limit alcohol intake to no more than 1 drink per day for nonpregnant women and 2 drinks per day for men. One drink equals 12 oz of beer, 5 oz of wine, or 1 oz of hard liquor.  Do not drink on an empty stomach.  Keep yourself hydrated with water, diet soda, or unsweetened iced tea.  Keep in mind that regular soda, juice, and other mixers may contain a lot of sugar and must be counted as carbs. What are tips for following this plan?  Reading food labels  Start by checking the serving size on the "Nutrition Facts" label of packaged foods and drinks. The amount of calories, carbs, fats, and other nutrients listed on the label is based on one serving of the item. Many items contain more than one serving per package.  Check the total grams (g) of carbs in one serving. You can calculate the number of servings of carbs in one serving by dividing the total carbs by 15. For example, if a food has 30 g of total carbs, it would be equal to 2 servings of carbs.  Check the number of grams (g) of saturated and trans fats in one serving. Choose foods that have low or no amount of these fats.  Check the number of   milligrams (mg) of salt (sodium) in one serving. Most people should limit total sodium intake to less than 2,300 mg per day.  Always check the nutrition information of foods labeled as "low-fat" or "nonfat". These foods may be higher in added sugar or refined carbs and should be avoided.  Talk to your dietitian to identify your daily goals for nutrients listed on the label. Shopping  Avoid buying canned, premade, or processed foods. These foods tend to be high in fat, sodium,  and added sugar.  Shop around the outside edge of the grocery store. This includes fresh fruits and vegetables, bulk grains, fresh meats, and fresh dairy. Cooking  Use low-heat cooking methods, such as baking, instead of high-heat cooking methods like deep frying.  Cook using healthy oils, such as olive, canola, or sunflower oil.  Avoid cooking with butter, cream, or high-fat meats. Meal planning  Eat meals and snacks regularly, preferably at the same times every day. Avoid going long periods of time without eating.  Eat foods high in fiber, such as fresh fruits, vegetables, beans, and whole grains. Talk to your dietitian about how many servings of carbs you can eat at each meal.  Eat 4-6 ounces (oz) of lean protein each day, such as lean meat, chicken, fish, eggs, or tofu. One oz of lean protein is equal to: ? 1 oz of meat, chicken, or fish. ? 1 egg. ?  cup of tofu.  Eat some foods each day that contain healthy fats, such as avocado, nuts, seeds, and fish. Lifestyle  Check your blood glucose regularly.  Exercise regularly as told by your health care provider. This may include: ? 150 minutes of moderate-intensity or vigorous-intensity exercise each week. This could be brisk walking, biking, or water aerobics. ? Stretching and doing strength exercises, such as yoga or weightlifting, at least 2 times a week.  Take medicines as told by your health care provider.  Do not use any products that contain nicotine or tobacco, such as cigarettes and e-cigarettes. If you need help quitting, ask your health care provider.  Work with a Social worker or diabetes educator to identify strategies to manage stress and any emotional and social challenges. Questions to ask a health care provider  Do I need to meet with a diabetes educator?  Do I need to meet with a dietitian?  What number can I call if I have questions?  When are the best times to check my blood glucose? Where to find more  information:  American Diabetes Association: diabetes.org  Academy of Nutrition and Dietetics: www.eatright.[employer] of Diabetes and Digestive and Kidney Diseases (NIH): [employer] Summary  A healthy meal plan will help you control your blood glucose and maintain a healthy lifestyle.  Working with a diet and nutrition specialist (dietitian) can help you make a meal plan that is best for you.  Keep in mind that carbohydrates (carbs) and alcohol have immediate effects on your blood glucose levels. It is important to count carbs and to use alcohol carefully. This information is not intended to replace advice given to you by your health care provider. Make sure you discuss any questions you have with your health care provider. Document Released: 02/04/2005 Document Revised: 12/08/2016 Document Reviewed: 06/14/2016 Elsevier Interactive Patient Education  2019 [employer].  Andrew [name] , Thank you for taking time to come for your Medicare Wellness Visit. I appreciate your ongoing commitment to your health goals. Please review the following plan we discussed and let me  know if I can assist you in the future.   These are the goals we discussed: Goals    . Exercise 150 min/wk Moderate Activity    . Have 3 meals a day       This is a list of the screening recommended for you and due dates:  Health Maintenance  Topic Date Due  . Tetanus Vaccine  01/22/1969  . Pneumonia vaccines (1 of 2 - PCV13) 01/23/2015  . Complete foot exam   02/24/2017  . Eye exam for diabetics  07/05/2017  . Flu Shot  08/22/2018*  . Hemoglobin A1C  10/09/2018  . Colon Cancer Screening  10/14/2020  .  Hepatitis C: One time screening is recommended by Center for Disease Control  (CDC) for  adults born from [birth year] through 1966.   Completed  *Topic was postponed. The date shown is not the original due date.

## 2018-07-25 ENCOUNTER — Telehealth: Payer: Self-pay | Admitting: *Deleted

## 2018-07-25 DIAGNOSIS — E1169 Type 2 diabetes mellitus with other specified complication: Secondary | ICD-10-CM

## 2018-07-25 DIAGNOSIS — E785 Hyperlipidemia, unspecified: Principal | ICD-10-CM

## 2018-07-25 LAB — LIPID PANEL
CHOLESTEROL TOTAL: 124 mg/dL (ref 100–199)
Chol/HDL Ratio: 3.2 ratio (ref 0.0–5.0)
HDL: 39 mg/dL — ABNORMAL LOW (ref >39)
LDL Calculated: 50 mg/dL (ref 0–99)
Triglycerides: 176 mg/dL — ABNORMAL HIGH (ref 0–149)
VLDL Cholesterol Cal: 35 mg/dL (ref 5–40)

## 2018-07-25 LAB — CBC WITH DIFFERENTIAL/PLATELET
Basophils Absolute: 0.1 10*3/uL (ref 0.0–0.2)
Basos: 1 %
EOS (ABSOLUTE): 0.4 10*3/uL (ref 0.0–0.4)
Eos: 4 %
Hematocrit: 48.8 % (ref 37.5–51.0)
Hemoglobin: 16.3 g/dL (ref 13.0–17.7)
Immature Grans (Abs): 0.1 10*3/uL (ref 0.0–0.1)
Immature Granulocytes: 1 %
Lymphocytes Absolute: 2.8 10*3/uL (ref 0.7–3.1)
Lymphs: 31 %
MCH: 30.7 pg (ref 26.6–33.0)
MCHC: 33.4 g/dL (ref 31.5–35.7)
MCV: 92 fL (ref 79–97)
Monocytes Absolute: 0.9 10*3/uL (ref 0.1–0.9)
Monocytes: 9 %
Neutrophils Absolute: 5.1 10*3/uL (ref 1.4–7.0)
Neutrophils: 54 %
Platelets: 244 10*3/uL (ref 150–450)
RBC: 5.31 x10E6/uL (ref 4.14–5.80)
RDW: 12.3 % (ref 11.6–15.4)
WBC: 9.3 10*3/uL (ref 3.4–10.8)

## 2018-07-25 LAB — CMP14+EGFR
A/G RATIO: 1.7 (ref 1.2–2.2)
ALBUMIN: 4.3 g/dL (ref 3.8–4.8)
ALT: 14 IU/L (ref 0–44)
AST: 9 IU/L (ref 0–40)
Alkaline Phosphatase: 71 IU/L (ref 39–117)
BUN/Creatinine Ratio: 16 (ref 10–24)
BUN: 14 mg/dL (ref 8–27)
Bilirubin Total: 0.3 mg/dL (ref 0.0–1.2)
CO2: 20 mmol/L (ref 20–29)
CREATININE: 0.9 mg/dL (ref 0.76–1.27)
Calcium: 9.7 mg/dL (ref 8.6–10.2)
Chloride: 102 mmol/L (ref 96–106)
GFR calc Af Amer: 101 mL/min/{1.73_m2} (ref >59)
GFR calc non Af Amer: 87 mL/min/{1.73_m2} (ref >59)
Globulin, Total: 2.5 g/dL (ref 1.5–4.5)
Glucose: 168 mg/dL — ABNORMAL HIGH (ref 65–99)
Potassium: 4.7 mmol/L (ref 3.5–5.2)
Sodium: 138 mmol/L (ref 134–144)
Total Protein: 6.8 g/dL (ref 6.0–8.5)

## 2018-07-25 NOTE — Telephone Encounter (Signed)
-----   Message from Leonie Man, MD sent at 07/24/2018 10:35 PM EST ----- Cholesterol panel looks pretty good.  LDL is up a bit though from 4 months ago.  Up from 43-63.  HDL and triglycerides are better.  Need to find out what dose of atorvastatin he is taking along with fenofibrate.  Also need to determine if he is having myalgias. I would not want to back off any further from his lipid management.  Glenetta Hew, MD

## 2018-07-25 NOTE — Telephone Encounter (Signed)
Left message to call back about lab results  

## 2018-07-27 DIAGNOSIS — E1142 Type 2 diabetes mellitus with diabetic polyneuropathy: Secondary | ICD-10-CM | POA: Diagnosis not present

## 2018-07-27 DIAGNOSIS — B351 Tinea unguium: Secondary | ICD-10-CM | POA: Diagnosis not present

## 2018-07-27 DIAGNOSIS — L84 Corns and callosities: Secondary | ICD-10-CM | POA: Diagnosis not present

## 2018-07-27 DIAGNOSIS — M79676 Pain in unspecified toe(s): Secondary | ICD-10-CM | POA: Diagnosis not present

## 2018-07-27 NOTE — Telephone Encounter (Signed)
Called patient, advised of lab results. Patient is taking 80 mg daily of the atorvastatin. He states he does have some myalgia in his legs along with some cramping. Advised I would send a message to [place] and Nurse and MD would give recommendations if needed.  Patient verbalized understanding.

## 2018-07-27 NOTE — Telephone Encounter (Signed)
Follow up  ° ° °Patient is returning call.  °

## 2018-07-28 NOTE — Telephone Encounter (Signed)
Lets try changing to rosuvastatin 40 mg po daily. Hold atorvastatin x 1 week before starting new med.  Move recheck of lipids/LFT out ~3 months  Glenetta Hew, MD

## 2018-07-31 ENCOUNTER — Encounter: Payer: Self-pay | Admitting: Nutrition

## 2018-07-31 ENCOUNTER — Encounter: Payer: Medicare Other | Attending: Family Medicine | Admitting: Nutrition

## 2018-07-31 VITALS — Ht 76.0 in | Wt 201.0 lb

## 2018-07-31 DIAGNOSIS — IMO0002 Reserved for concepts with insufficient information to code with codable children: Secondary | ICD-10-CM

## 2018-07-31 DIAGNOSIS — E1165 Type 2 diabetes mellitus with hyperglycemia: Secondary | ICD-10-CM | POA: Insufficient documentation

## 2018-07-31 DIAGNOSIS — E118 Type 2 diabetes mellitus with unspecified complications: Secondary | ICD-10-CM | POA: Diagnosis not present

## 2018-07-31 NOTE — Progress Notes (Signed)
  Medical Nutrition Therapy:  Appt start time: 0930 end time:  1030.   Assessment:  Primary concerns today: Diabetes Type 2. PMH Heart Attack with stents and angioplasty. Here with his daughter. A1C 7.4%, down from 7.6%. TG up to 176 mg/dl. This may be from the little debbie oatmeal cookies he is eating.  He notes he is trying to eat better foods at times discussed. Still eats some fruit later at night,  overall better food choices. Still working on his farm for activity.Vania Rea,, Januvia and Metformin. Limited finances which restrict food choices.  FBS 130-170 in am and before bed: 120-140's.  Lost 13 lbs since last visit. Feels better. Not usually hungry between meals. Encouraged to avoid fast foods and processed foods.      Lab Results  Component Value Date   HGBA1C 7.4 (H) 07/24/2018   CMP Latest Ref Rng & Units 07/24/2018 07/24/2018 01/25/2018  Glucose 65 - 99 mg/dL 168(H) 146(H) 193(H)  BUN 8 - 27 mg/dL 14 15 24(H)  Creatinine 0.76 - 1.27 mg/dL 0.90 0.84 0.91  Sodium 134 - 144 mmol/L 138 135 136  Potassium 3.5 - 5.2 mmol/L 4.7 5.0 3.9  Chloride 96 - 106 mmol/L 102 99 103  CO2 20 - 29 mmol/L 20 20 23   Calcium 8.6 - 10.2 mg/dL 9.7 9.8 9.1  Total Protein 6.0 - 8.5 g/dL 6.8 7.0 -  Total Bilirubin 0.0 - 1.2 mg/dL 0.3 0.4 -  Alkaline Phos 39 - 117 IU/L 71 68 -  AST 0 - 40 IU/L 9 12 -  ALT 0 - 44 IU/L 14 15 -   Lipid Panel     Component Value Date/Time   CHOL 124 07/24/2018 1440   TRIG 176 (H) 07/24/2018 1440   HDL 39 (L) 07/24/2018 1440   CHOLHDL 3.2 07/24/2018 1440   CHOLHDL 2.8 01/23/2018 0259   VLDL 54 (H) 01/23/2018 0259   LDLCALC 50 07/24/2018 1440     Preferred Learning Style:    No preference indicated    Learning Readiness:   Ready  Change in progress   MEDICATIONS:   DIETARY INTAKE: Breakfast: 2 debbie oatmeal cookies. Lunch: fish sandwich 2, apple Dinner: meat and vegetables,  Fruit in evening Water   Usual physical activity: activities on  farm.  Estimated energy needs: 1800-2000  calories 200  g carbohydrates 135  g protein 50 g fat  Progress Towards Goal(s):  In progress.   Nutritional Diagnosis:  NB-1.1 Food and nutrition-related knowledge deficit As related to Diabetes.  As evidenced by A1C 7.7%.    Intervention:  Nutrition and Diabetes education provided on My Plate, CHO counting, meal planning, portion sizes, timing of meals, avoiding snacks between meals unless having a low blood sugar, target ranges for A1C and blood sugars, signs/symptoms and treatment of hyper/hypoglycemia, monitoring blood sugars, taking medications as prescribed, benefits of exercising 30 minutes per day and prevention of complications of DM.   Goals Cut down on processed foods-cut out little debbie oatmeal cookies Increase fresh fruits and vegetables. Drink more water Get A1C  Down to 7%  Teaching Method Utilized:  Visual Auditory Hands on  Handouts given during visit include:  The Plate Method  Meal Plan Card   Barriers to learning/adherence to lifestyle change: none  Demonstrated degree of understanding via:  Teach Back   Monitoring/Evaluation:  Dietary intake, exercise, meal planning, and body weight in 3-4 month(s).

## 2018-07-31 NOTE — Patient Instructions (Addendum)
Goals Cut down on processed foods-cut out little debbie oatmeal cookies Increase fresh fruits and vegetables. Drink more water Get A1C  Down to 7%

## 2018-08-01 ENCOUNTER — Other Ambulatory Visit: Payer: Self-pay

## 2018-08-01 DIAGNOSIS — E1169 Type 2 diabetes mellitus with other specified complication: Secondary | ICD-10-CM

## 2018-08-01 DIAGNOSIS — E785 Hyperlipidemia, unspecified: Principal | ICD-10-CM

## 2018-08-01 MED ORDER — ROSUVASTATIN CALCIUM 40 MG PO TABS: 40.0000 mg | ORAL_TABLET | Freq: Every day | ORAL | 3 refills | Status: DC

## 2018-08-01 NOTE — Telephone Encounter (Signed)
Patient was advised of medication change and instructions. Lab ordered for three months.

## 2018-08-02 ENCOUNTER — Other Ambulatory Visit: Payer: Self-pay

## 2018-08-02 ENCOUNTER — Encounter: Payer: Self-pay | Admitting: Cardiology

## 2018-08-02 ENCOUNTER — Ambulatory Visit (INDEPENDENT_AMBULATORY_CARE_PROVIDER_SITE_OTHER): Payer: Medicare Other | Admitting: Cardiology

## 2018-08-02 VITALS — BP 106/58 | HR 75 | Ht 76.0 in | Wt 202.6 lb

## 2018-08-02 DIAGNOSIS — I10 Essential (primary) hypertension: Secondary | ICD-10-CM | POA: Diagnosis not present

## 2018-08-02 DIAGNOSIS — E1169 Type 2 diabetes mellitus with other specified complication: Secondary | ICD-10-CM | POA: Diagnosis not present

## 2018-08-02 DIAGNOSIS — Z955 Presence of coronary angioplasty implant and graft: Secondary | ICD-10-CM | POA: Diagnosis not present

## 2018-08-02 DIAGNOSIS — I25119 Atherosclerotic heart disease of native coronary artery with unspecified angina pectoris: Secondary | ICD-10-CM

## 2018-08-02 DIAGNOSIS — F17200 Nicotine dependence, unspecified, uncomplicated: Secondary | ICD-10-CM

## 2018-08-02 DIAGNOSIS — E785 Hyperlipidemia, unspecified: Secondary | ICD-10-CM

## 2018-08-02 MED ORDER — TICAGRELOR 90 MG PO TABS: 90.0000 mg | ORAL_TABLET | Freq: Two times a day (BID) | ORAL | 0 refills | Status: DC

## 2018-08-02 NOTE — Progress Notes (Signed)
PCP: Dettinger, Fransisca Kaufmann, MD  Clinic Note: Chief Complaint  Patient presents with  . Follow-up    Doing well; recovering from a cold  . Coronary Artery Disease    HPI: Andrew [name] is a 68 y.o. male with a PMH below who presents today for 3 month f/u for CAD-PCI in setting of ACS (NSTEMI).   Admitted to [facility] on January 20, 2018 with a prolonged episode of substernal chest pain.  He had pain off and on night long.  Ruled in for MI overnight, and was noted to have chest pain upon evaluation in the morning with EKG symptoms concerning for possible STEMI.    He was brought for urgent catheterization on January 21, 2018 revealing significant CAD including distal LAD and circumflex occlusion.    Both appeared to be chronic as I was unable to truly open up the circumflex.    Therefore stented the ramus intermedius.    His symptoms improved and he was discharged relatively stable.  His other past medical history includes DM-2 on insulin, hypertension, hyperlipidemia, COPD (longstanding smoking), OSA (not on BiPAP), history of CVA.  Recent Hospitalizations:   none  Studies Personally Reviewed - (if available, images/films reviewed: From Epic Chart or Care Everywhere)  none   Interval History: Andrew [name] presents here today seemingly much more accepting of his condition.  He says that he understands that he is had to slow down over the last several years.  Not able to do what he was able to do 15 years ago.  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  When he is on his feet for long period time his knees and and feet really hurt him quite a bit when he noted some swelling but otherwise does not note swelling.  He has rare episodes of chest pain off and on especially if he overexerts, but not with routine activity or even routine exertion.  This is often when he gets tired He denies any resting dyspnea but does note some exertional dyspnea when he  overexerts. No PND or orthopnea but does have mild end of day swelling.  He does sometimes get limited chest discomfort when he first lies down after long day at work.  This happens maybe once or twice a month.  Denies any rapid or irregular heartbeat/palpitations.  No lightheadedness or dizziness.  No syncope/near syncope or TIA /amaurosis fugax   Rare off-and-on flutters but no rapid palpitations.  No claudication.  ROS: A comprehensive was performed. Review of Systems  Constitutional: Positive for malaise/fatigue (Does not sleep well;Limited a lot by his COPD and arthritis pains.). Negative for weight loss (Actually seems to have gained weight since his hospitalization).  HENT: Positive for hearing loss. Negative for congestion and nosebleeds.   Respiratory: Positive for cough (In the mornings), sputum production (Just phlegm), shortness of breath (Not much different than baseline.  With some exertional dyspnea.) and wheezing.        Chronic COPD symptoms, with recent cold  Cardiovascular: Positive for leg swelling (Per HPI).  Genitourinary: Positive for frequency (Nocturia).  Musculoskeletal: Positive for joint pain. Negative for falls.  Neurological: Positive for dizziness (Orthostatic) and weakness (Global). Negative for focal weakness.  Psychiatric/Behavioral: Positive for depression (Not per his direct admission, but by his lack of desire to do things.  Also very tearful). The patient has insomnia (Not really as much insomnia is nocturia).   All other systems  reviewed and are negative.   I have reviewed and (if needed) personally updated the patient's problem list, medications, allergies, past medical and surgical history, social and family history.   Past Medical History:  Diagnosis Date  . Allergy   . Anemia   . Arthritis   . Cancer [facility])    Bladder  . Depression   . Diabetes mellitus without complication ([place])   . GERD (gastroesophageal reflux disease)   . Hyperlipidemia    . Hypertension    in the past no longer on medication  . Neuromuscular disorder ([place])   . Sleep apnea    BiPAP no used in 3 years  . Status post tendon repair 1989  . STEMI (ST elevation myocardial infarction) ([place]) 01/20/2018   Cardiac cath January 21, 2018: Severe multivessel disease with unclear lesion, but opted for PCI/DES x1 to the RI.  Appeared to have CTO of the distal LAD and circumflex as well as moderate mid LAD and diagonal disease as well as PDA.Marland Kitchen  Unable to cross circumflex lesion.  . Stroke ([place])    At times pt has dizziness with loss of vision  . Tremors of nervous system 2011    Past Surgical History:  Procedure Laterality Date  . APPENDECTOMY    . BLADDER SURGERY    . COLONOSCOPY    . CORONARY STENT INTERVENTION N/A 01/21/2018   Procedure: CORONARY STENT INTERVENTION;  Surgeon: Leonie Man, MD;  Location: [place] [facility];  Service: Cardiovascular;  Laterality: N/A;  95% Ramus Intermedius -PCI with synergy DES 2.25 mm x 12 mm postdilated 2.4 mm.  [place] ACUTE MI REVASCULARIZATION N/A 01/21/2018   Procedure: Coronary/Graft Acute MI Revascularization;  Surgeon: Leonie Man, MD;  Location: [place];  Service: Cardiovascular;  Laterality: N/A;  attempted revas to distal CFX;   . CYSTOSCOPY W/ RETROGRADES Bilateral 08/22/2015   Procedure: CYSTOSCOPY WITH RETROGRADE PYELOGRAM;  Surgeon: Irine Seal, MD;  Location: [facility];  Service: Urology;  Laterality: Bilateral;  . CYSTOSCOPY W/ RETROGRADES Bilateral 01/16/2016   Procedure: CYSTOSCOPY WITH RETROGRADE PYELOGRAM;  Surgeon: Irine Seal, MD;  Location: [facility];  Service: Urology;  Laterality: Bilateral;  . CYSTOSCOPY W/ RETROGRADES Bilateral 01/07/2017   Procedure: CYSTOSCOPY WITH RETROGRADE PYELOGRAM;  Surgeon: Irine Seal, MD;  Location: [facility];  Service: Urology;  Laterality: Bilateral;  . CYSTOSCOPY WITH BIOPSY N/A 08/22/2015   Procedure: CYSTOSCOPY WITH BLADDER BIOPSY;  Surgeon: Irine Seal, MD;   Location: [facility];  Service: Urology;  Laterality: N/A;  . CYSTOSCOPY WITH BIOPSY N/A 01/16/2016   Procedure: CYSTOSCOPY WITH BLADDER BIOPSY;  Surgeon: Irine Seal, MD;  Location: [facility];  Service: Urology;  Laterality: N/A;  . CYSTOSCOPY WITH FULGERATION N/A 01/16/2016   Procedure: CYSTOSCOPY WITH FULGERATION;  Surgeon: Irine Seal, MD;  Location: [facility];  Service: Urology;  Laterality: N/A;  . KNEE ARTHROSCOPY Right 1989  . LEFT HEART CATH AND CORONARY ANGIOGRAPHY N/A 01/21/2018   Procedure: LEFT HEART CATH AND CORONARY ANGIOGRAPHY;  Surgeon: Leonie Man, MD;  Location: [place] [facility];  Service: Cardiovascular;; 95% RI-DES PCI.  80% o-pCX (PTCA) -> dCX 80%-100% CTO (unsuccessful PTCA unable to cross distal lesion with balloon.)  100% CTO dLAD. RPDA 80% and 70% as well as P AV 180%, PA V2 60%.  (too small for PCI)  . ROTATOR CUFF REPAIR Bilateral 2000  . rt. leg fracture surgery    . TRANSTHORACIC ECHOCARDIOGRAM  01/21/2018   Mild LVH.  EF 50  and 55%.  Apical inferior hypokinesis.  Mid-apical anterolateral hypokinesis.  Normal diastolic function for age   . TRANSURETHRAL RESECTION OF BLADDER TUMOR N/A 01/07/2017   Procedure: TRANSURETHRAL RESECTION OF BLADDER TUMOR (TURBT);  Surgeon: Irine Seal, MD;  Location: [facility];  Service: Urology;  Laterality: N/A;  . WISDOM TOOTH EXTRACTION      Cath-PCI 01/21/2018:  MV CAD -> RAMUS 95%--PCI Synergy DES 2.25 mm x 12 mm - 2.4 mm.  80% ost-pCx lesion treated with balloon angioplasty, distal circumflex lesion of 80% stenosis treated with balloon angioplasty with minimal effect.  100% stenosed dCx unable to cross lesion with the wire or balloon, 95% , CTO of the distal LAD not amenable to PCI.  RPDA 80% and 70% as well as P AV 180%, PA V2 60%.  (too small for PCI).    Current Meds  Medication Sig  . aspirin 81 MG tablet Take 81 mg by mouth daily.  . bisoprolol (ZEBETA) 5 MG tablet Take 0.5 tablets (2.5 mg total) by mouth daily. Take at 2pm  .  empagliflozin (JARDIANCE) 25 MG TABS tablet Take 25 mg by mouth daily.  . fenofibrate (TRICOR) 48 MG tablet Take 1 tablet (48 mg total) by mouth daily.  Marland Kitchen HYDROcodone-acetaminophen (NORCO/VICODIN) 5-325 MG tablet Take 1 tablet by mouth 2 (two) times daily as needed for moderate pain.  Marland Kitchen HYDROcodone-acetaminophen (NORCO/VICODIN) 5-325 MG tablet Take 1 tablet by mouth 2 (two) times daily as needed for moderate pain. Do not refill until 30 days from prescription date  . HYDROcodone-acetaminophen (NORCO/VICODIN) 5-325 MG tablet Take 1 tablet by mouth 2 (two) times daily as needed for moderate pain. Do not refill until 60 days from prescription date  . isosorbide mononitrate (IMDUR) 30 MG 24 hr tablet Take 0.5 tablets (15 mg total) by mouth daily.  Marland Kitchen lisinopril (PRINIVIL,ZESTRIL) 5 MG tablet Take 1 tablet (5 mg total) by mouth daily.  . metFORMIN (GLUCOPHAGE) 1000 MG tablet Take 1 tablet by mouth 2 times daily with a meal.  . omeprazole (PRILOSEC) 20 MG capsule TAKE (1) CAPSULE DAILY  . primidone (MYSOLINE) 50 MG tablet Take 1 tablet (50 mg total) by mouth 4 (four) times daily.  . rosuvastatin (CRESTOR) 40 MG tablet Take 1 tablet (40 mg total) by mouth daily.  . sitaGLIPtin (JANUVIA) 100 MG tablet Take 1 tablet (100 mg total) by mouth daily.  . tamsulosin (FLOMAX) 0.4 MG CAPS capsule Take 1 capsule (0.4 mg total) by mouth daily.  . ticagrelor (BRILINTA) 90 MG TABS tablet Take 1 tablet (90 mg total) by mouth 2 (two) times daily.  . [DISCONTINUED] ticagrelor (BRILINTA) 90 MG TABS tablet Take 1 tablet (90 mg total) by mouth 2 (two) times daily.    Allergies  Allergen Reactions  . Tramadol Shortness Of Breath    And dizziness  . Trulicity [Dulaglutide] Swelling    Social History   Tobacco Use  . Smoking status: Current Every Day Smoker    Packs/day: 0.50    Years: 42.00    Pack years: 21.00    Types: Cigarettes  . Smokeless tobacco: Never Used  Substance Use Topics  . Alcohol use: No     Comment: rarely  . Drug use: No   Social History   Social History Narrative  . Not on file    Family History  family history includes Cancer in his brother; Dementia in his father; Diabetes in his brother and brother; Diabetes (age of onset: 32) in his mother; Heart attack (  age of onset: 45) in his brother; Stroke in his mother; Testicular cancer in his brother.  Wt Readings from Last 3 Encounters:  08/02/18 202 lb 9.6 oz (91.9 kg)  07/31/18 201 lb (91.2 kg)  07/24/18 205 lb 6.4 oz (93.2 kg)    PHYSICAL EXAM BP (!) 106/58   Pulse 75   Ht 6\' 4"  (1.93 m)   Wt 202 lb 9.6 oz (91.9 kg)   SpO2 97%   BMI 24.66 kg/m  Physical Exam  Constitutional: He is oriented to person, place, and time. He appears well-developed and well-nourished. No distress.  Relatively well groomed.  Does appear quite older than stated age.  HENT:  Head: Normocephalic and atraumatic.  Neck: Normal range of motion. Neck supple. Hepatojugular reflux (Mild) present. No JVD present. Carotid bruit is present (Possible soft bilateral). No thyromegaly present.  Cardiovascular: Normal rate, regular rhythm, S1 normal and S2 normal.  Extrasystoles are present. PMI is not displaced. Exam reveals distant heart sounds and decreased pulses (Mildly reduced pedal). Exam reveals no gallop (Not loud enough heart sounds to hear) and no friction rub.  No murmur heard. Pulmonary/Chest: Effort normal. No respiratory distress. He has no wheezes. He has no rales (No rhonchi). He exhibits no tenderness.  Mild interstitial sounds  Abdominal: Soft. Bowel sounds are normal. He exhibits no distension. There is no abdominal tenderness.  No HSM  Musculoskeletal: Normal range of motion.        General: No edema.  Neurological: He is alert and oriented to person, place, and time.  Psychiatric: He has a normal mood and affect. His behavior is normal. Judgment and thought content normal.  Vitals reviewed.    Adult ECG Report Not checked   Other studies Reviewed: Additional studies/ records that were reviewed today include:  Recent Labs:   Lab Results  Component Value Date   CHOL 124 07/24/2018   HDL 39 (L) 07/24/2018   LDLCALC 50 07/24/2018   TRIG 176 (H) 07/24/2018   CHOLHDL 3.2 07/24/2018   Lab Results  Component Value Date   CREATININE 0.90 07/24/2018   BUN 14 07/24/2018   NA 138 07/24/2018   K 4.7 07/24/2018   CL 102 07/24/2018   CO2 20 07/24/2018    ASSESSMENT / PLAN: Problem List Items Addressed This Visit    Coronary artery disease involving native coronary artery of native heart with angina pectoris ([place]) - Primary (Chronic)    Significant multivessel disease with only 1 PCI minimal target.  He does have some angina mostly anterior decubitus or with heavy exertion.  I told him that if he has the angina decubitus he should take nitroglycerin.  If that helps, we would potentially increase his Imdur dose to 30 mg. He is on bisoprolol which I would like to titrate up, but he has been having some low blood pressure readings. He is on aspirin and Brilinta along with moderate dose statin. For his diabetes/CAD he is on lisinopril and Jardiance      Relevant Orders   EKG 12-Lead (Completed)   Essential hypertension (Chronic)    Blood pressure is borderline low today.  He has had some low blood pressure readings in the past.  We will continue current doses of lisinopril and bisoprolol.      Hyperlipidemia associated with type 2 diabetes mellitus (HCC) (Chronic)    On 40 mg of rosuvastatin.  Just had lipids checked and LDL was at bull's-eye target of 50.  Continue current meds.  Presence of drug coated stent in ramus intermedius coronary artery (Chronic)    Remains on aspirin Brilinta.  Okay to hold aspirin for bruising. For urgent procedures, okay to stop Brilinta for 5-7 days preprocedure, but would prefer to continue uninterrupted until August. After August 2020, would be okay to hold for 5-7 days  preop.      Tobacco use disorder (Chronic)    He seems to be doing relatively well since quitting.  I congratulated his efforts.         I spent a total of 35 minutes with the patient and chart review. >  50% of the time was spent in direct patient consultation.   Current medicines are reviewed at length with the patient today.  (+/- concerns) none The following changes have been made:  See below  Patient Instructions  Any Other Special Instructions Will Be Listed Below.  IF YOU DEVELOP ANY CHEST PAIN OR BECOME SHORT OF BREATHE WHILE SLEEPING AND IT AWAKENS YOU TRY USING YOUR NITROGLYCERIN TABLETS UNDER YOUR TONGUE-  ALSO -- HOLD  LISINOPRIL FOR 2 TO 3 DAYS IF YOU HAVE DAYS WHEN YOU ARE NOT FEELING GOOD -  Medication Instructions:  NOT  MEEDED If you need a refill on your cardiac medications before your next appointment, please call your pharmacy.   Lab work: NOT NEEDED -  If you have labs (blood work) drawn today and your tests are completely normal, you will receive your results only by: Marland Kitchen MyChart Message (if you have MyChart) OR . A paper copy in the mail If you have any lab test that is abnormal or we need to change your treatment, we will call you to review the results.  Testing/Procedures: NOT NEEDED  Follow-Up: At [facility], you and your health needs are our priority.  As part of our continuing mission to provide you with exceptional heart care, we have created designated Provider Care Teams.  These Care Teams include your primary Cardiologist (physician) and Advanced Practice Providers (APPs -  Physician Assistants and Nurse Practitioners) who all work together to provide you with the care you need, when you need it. . Your physician recommends that you schedule a follow-up appointment in June 2020  [place] .    DR [facility] WILL SEE YOU IN June 2020 FOR PRE OP CLEARANCE FOR UPCOMING SURGERY.     Studies Ordered:   Orders Placed This Encounter   Procedures  . EKG 12-Lead      Glenetta Hew, M.D., M.S. Interventional Cardiologist   Pager # [phone] Phone # [phone] [address] [place], [place] [address]   Thank you for choosing Heartcare at [facility]!!

## 2018-08-02 NOTE — Patient Instructions (Addendum)
Any Other Special Instructions Will Be Listed Below.  IF YOU DEVELOP ANY CHEST PAIN OR BECOME SHORT OF BREATHE WHILE SLEEPING AND IT AWAKENS YOU TRY USING YOUR NITROGLYCERIN TABLETS UNDER YOUR TONGUE-  ALSO -- HOLD  LISINOPRIL FOR 2 TO 3 DAYS IF YOU HAVE DAYS WHEN YOU ARE NOT FEELING GOOD -  Medication Instructions:  NOT  MEEDED If you need a refill on your cardiac medications before your next appointment, please call your pharmacy.   Lab work: NOT NEEDED -  If you have labs (blood work) drawn today and your tests are completely normal, you will receive your results only by: Marland Kitchen MyChart Message (if you have MyChart) OR . A paper copy in the mail If you have any lab test that is abnormal or we need to change your treatment, we will call you to review the results.  Testing/Procedures: NOT NEEDED  Follow-Up: At [facility], you and your health needs are our priority.  As part of our continuing mission to provide you with exceptional heart care, we have created designated Provider Care Teams.  These Care Teams include your primary Cardiologist (physician) and Advanced Practice Providers (APPs -  Physician Assistants and Nurse Practitioners) who all work together to provide you with the care you need, when you need it. . Your physician recommends that you schedule a follow-up appointment in June 2020  [place] .    DR [facility] WILL SEE YOU IN June 2020 FOR PRE OP CLEARANCE FOR UPCOMING SURGERY.

## 2018-08-05 ENCOUNTER — Encounter: Payer: Self-pay | Admitting: Cardiology

## 2018-08-05 NOTE — Assessment & Plan Note (Signed)
Remains on aspirin Brilinta.  Okay to hold aspirin for bruising. For urgent procedures, okay to stop Brilinta for 5-7 days preprocedure, but would prefer to continue uninterrupted until August. After August 2020, would be okay to hold for 5-7 days preop.

## 2018-08-05 NOTE — Assessment & Plan Note (Signed)
On 40 mg of rosuvastatin.  Just had lipids checked and LDL was at bull's-eye target of 50.  Continue current meds.

## 2018-08-05 NOTE — Assessment & Plan Note (Signed)
Blood pressure is borderline low today.  He has had some low blood pressure readings in the past.  We will continue current doses of lisinopril and bisoprolol.

## 2018-08-05 NOTE — Assessment & Plan Note (Signed)
Significant multivessel disease with only 1 PCI minimal target.  He does have some angina mostly anterior decubitus or with heavy exertion.  I told him that if he has the angina decubitus he should take nitroglycerin.  If that helps, we would potentially increase his Imdur dose to 30 mg. He is on bisoprolol which I would like to titrate up, but he has been having some low blood pressure readings. He is on aspirin and Brilinta along with moderate dose statin. For his diabetes/CAD he is on lisinopril and Jardiance

## 2018-08-05 NOTE — Assessment & Plan Note (Signed)
He seems to be doing relatively well since quitting.  I congratulated his efforts.

## 2018-08-07 ENCOUNTER — Telehealth: Payer: Self-pay | Admitting: Family Medicine

## 2018-08-08 ENCOUNTER — Other Ambulatory Visit: Payer: Self-pay | Admitting: Pediatrics

## 2018-08-08 ENCOUNTER — Other Ambulatory Visit: Payer: Self-pay | Admitting: *Deleted

## 2018-08-08 DIAGNOSIS — D229 Melanocytic nevi, unspecified: Secondary | ICD-10-CM

## 2018-08-08 DIAGNOSIS — K219 Gastro-esophageal reflux disease without esophagitis: Secondary | ICD-10-CM

## 2018-08-08 MED ORDER — FENOFIBRATE 48 MG PO TABS: 48.0000 mg | ORAL_TABLET | Freq: Every day | ORAL | 5 refills | Status: DC

## 2018-08-08 NOTE — Telephone Encounter (Signed)
He said he was going to try and call some, if he wants a referral we can go ahead and do a referral, atypical nevus as the diagnosis of ears/face if I recall.  We can refer him anywhere in [place]

## 2018-08-08 NOTE — Telephone Encounter (Signed)
Patient aware referral has been placed.

## 2018-08-17 ENCOUNTER — Telehealth: Payer: Self-pay | Admitting: Cardiology

## 2018-08-17 NOTE — Telephone Encounter (Signed)
Patient calling the office for samples of medication:   1.  What medication and dosage are you requesting samples for? ticagrelor (BRILINTA) 90 MG TABS tablet  2.  Are you currently out of this medication? No, still has some.

## 2018-08-17 NOTE — Telephone Encounter (Signed)
Called patient, advised that samples were up front to pick up tomorrow. Patient verbalized understanding.

## 2018-08-18 DIAGNOSIS — R208 Other disturbances of skin sensation: Secondary | ICD-10-CM | POA: Diagnosis not present

## 2018-08-18 DIAGNOSIS — D485 Neoplasm of uncertain behavior of skin: Secondary | ICD-10-CM | POA: Diagnosis not present

## 2018-08-18 DIAGNOSIS — L720 Epidermal cyst: Secondary | ICD-10-CM | POA: Diagnosis not present

## 2018-08-31 ENCOUNTER — Telehealth: Payer: Self-pay | Admitting: Cardiology

## 2018-08-31 NOTE — Telephone Encounter (Signed)
OK to use PRN Aleve - while on Brilinta - just don't take ASA when you take Aleve.  Sounds more MSK-like.  Glenetta Hew c

## 2018-08-31 NOTE — Telephone Encounter (Signed)
Pt notified he will skip ASA if he takes Aleve

## 2018-08-31 NOTE — Telephone Encounter (Signed)
Spoke to patient . Patient states he has a pain under his  left  collarbone since 5 am this morning. Patient states he did use on NTG at that time -  Patient states he feels  The discomfort all day , no new shortness of breathe, able to be active today --symptoms did not become worse.  Patient has not used in pain med. Patient states tylenol does not work , he only has aleve. " I was told not use that anymore while I am taking Brilinta.  Patient aware will defer to dr harding

## 2018-09-03 ENCOUNTER — Inpatient Hospital Stay (HOSPITAL_COMMUNITY)
Admission: EM | Admit: 2018-09-03 | Discharge: 2018-09-05 | DRG: 251 | Disposition: A | Discharge status: Home / Self Care | Payer: Medicare Other | Attending: Cardiovascular Disease | Admitting: Cardiovascular Disease

## 2018-09-03 ENCOUNTER — Emergency Department (HOSPITAL_COMMUNITY): Payer: Medicare Other

## 2018-09-03 ENCOUNTER — Other Ambulatory Visit: Payer: Self-pay

## 2018-09-03 ENCOUNTER — Encounter (HOSPITAL_COMMUNITY): Payer: Self-pay | Admitting: Emergency Medicine

## 2018-09-03 DIAGNOSIS — E119 Type 2 diabetes mellitus without complications: Secondary | ICD-10-CM

## 2018-09-03 DIAGNOSIS — R0902 Hypoxemia: Secondary | ICD-10-CM | POA: Diagnosis not present

## 2018-09-03 DIAGNOSIS — Z888 Allergy status to other drugs, medicaments and biological substances status: Secondary | ICD-10-CM

## 2018-09-03 DIAGNOSIS — Z7982 Long term (current) use of aspirin: Secondary | ICD-10-CM

## 2018-09-03 DIAGNOSIS — I2119 ST elevation (STEMI) myocardial infarction involving other coronary artery of inferior wall: Secondary | ICD-10-CM | POA: Diagnosis not present

## 2018-09-03 DIAGNOSIS — J441 Chronic obstructive pulmonary disease with (acute) exacerbation: Secondary | ICD-10-CM | POA: Diagnosis present

## 2018-09-03 DIAGNOSIS — Z823 Family history of stroke: Secondary | ICD-10-CM

## 2018-09-03 DIAGNOSIS — I252 Old myocardial infarction: Secondary | ICD-10-CM

## 2018-09-03 DIAGNOSIS — Z955 Presence of coronary angioplasty implant and graft: Secondary | ICD-10-CM

## 2018-09-03 DIAGNOSIS — I313 Pericardial effusion (noninflammatory): Secondary | ICD-10-CM | POA: Diagnosis not present

## 2018-09-03 DIAGNOSIS — I959 Hypotension, unspecified: Secondary | ICD-10-CM | POA: Diagnosis present

## 2018-09-03 DIAGNOSIS — Z7902 Long term (current) use of antithrombotics/antiplatelets: Secondary | ICD-10-CM

## 2018-09-03 DIAGNOSIS — G4730 Sleep apnea, unspecified: Secondary | ICD-10-CM | POA: Diagnosis present

## 2018-09-03 DIAGNOSIS — Z8043 Family history of malignant neoplasm of testis: Secondary | ICD-10-CM

## 2018-09-03 DIAGNOSIS — Z79899 Other long term (current) drug therapy: Secondary | ICD-10-CM

## 2018-09-03 DIAGNOSIS — R05 Cough: Secondary | ICD-10-CM

## 2018-09-03 DIAGNOSIS — F17210 Nicotine dependence, cigarettes, uncomplicated: Secondary | ICD-10-CM | POA: Diagnosis present

## 2018-09-03 DIAGNOSIS — F329 Major depressive disorder, single episode, unspecified: Secondary | ICD-10-CM | POA: Diagnosis present

## 2018-09-03 DIAGNOSIS — K219 Gastro-esophageal reflux disease without esophagitis: Secondary | ICD-10-CM | POA: Diagnosis present

## 2018-09-03 DIAGNOSIS — Z885 Allergy status to narcotic agent status: Secondary | ICD-10-CM

## 2018-09-03 DIAGNOSIS — E7849 Other hyperlipidemia: Secondary | ICD-10-CM | POA: Diagnosis present

## 2018-09-03 DIAGNOSIS — F17200 Nicotine dependence, unspecified, uncomplicated: Secondary | ICD-10-CM | POA: Diagnosis present

## 2018-09-03 DIAGNOSIS — Z20828 Contact with and (suspected) exposure to other viral communicable diseases: Secondary | ICD-10-CM | POA: Diagnosis present

## 2018-09-03 DIAGNOSIS — E1151 Type 2 diabetes mellitus with diabetic peripheral angiopathy without gangrene: Secondary | ICD-10-CM | POA: Diagnosis present

## 2018-09-03 DIAGNOSIS — Z9861 Coronary angioplasty status: Secondary | ICD-10-CM

## 2018-09-03 DIAGNOSIS — I10 Essential (primary) hypertension: Secondary | ICD-10-CM | POA: Diagnosis present

## 2018-09-03 DIAGNOSIS — Z8551 Personal history of malignant neoplasm of bladder: Secondary | ICD-10-CM

## 2018-09-03 DIAGNOSIS — E1159 Type 2 diabetes mellitus with other circulatory complications: Secondary | ICD-10-CM

## 2018-09-03 DIAGNOSIS — I301 Infective pericarditis: Secondary | ICD-10-CM

## 2018-09-03 DIAGNOSIS — I25119 Atherosclerotic heart disease of native coronary artery with unspecified angina pectoris: Secondary | ICD-10-CM | POA: Diagnosis present

## 2018-09-03 DIAGNOSIS — Z7984 Long term (current) use of oral hypoglycemic drugs: Secondary | ICD-10-CM

## 2018-09-03 DIAGNOSIS — Z818 Family history of other mental and behavioral disorders: Secondary | ICD-10-CM

## 2018-09-03 DIAGNOSIS — Z8249 Family history of ischemic heart disease and other diseases of the circulatory system: Secondary | ICD-10-CM

## 2018-09-03 DIAGNOSIS — E1169 Type 2 diabetes mellitus with other specified complication: Secondary | ICD-10-CM | POA: Diagnosis present

## 2018-09-03 DIAGNOSIS — E785 Hyperlipidemia, unspecified: Secondary | ICD-10-CM | POA: Diagnosis present

## 2018-09-03 DIAGNOSIS — R072 Precordial pain: Secondary | ICD-10-CM

## 2018-09-03 DIAGNOSIS — Z833 Family history of diabetes mellitus: Secondary | ICD-10-CM

## 2018-09-03 DIAGNOSIS — R059 Cough, unspecified: Secondary | ICD-10-CM

## 2018-09-03 DIAGNOSIS — R079 Chest pain, unspecified: Secondary | ICD-10-CM | POA: Diagnosis not present

## 2018-09-03 DIAGNOSIS — Z8673 Personal history of transient ischemic attack (TIA), and cerebral infarction without residual deficits: Secondary | ICD-10-CM

## 2018-09-03 HISTORY — DX: Infective pericarditis: I30.1

## 2018-09-03 LAB — CBC
HCT: 44.7 % (ref 39.0–52.0)
Hemoglobin: 15 g/dL (ref 13.0–17.0)
MCH: 31.3 pg (ref 26.0–34.0)
MCHC: 33.6 g/dL (ref 30.0–36.0)
MCV: 93.1 fL (ref 80.0–100.0)
Platelets: 243 10*3/uL (ref 150–400)
RBC: 4.8 MIL/uL (ref 4.22–5.81)
RDW: 12.3 % (ref 11.5–15.5)
WBC: 13.1 10*3/uL — ABNORMAL HIGH (ref 4.0–10.5)
nRBC: 0 % (ref 0.0–0.2)

## 2018-09-03 LAB — HEPATIC FUNCTION PANEL
ALT: 13 U/L (ref 0–44)
AST: 14 U/L — ABNORMAL LOW (ref 15–41)
Albumin: 3.6 g/dL (ref 3.5–5.0)
Alkaline Phosphatase: 53 U/L (ref 38–126)
Bilirubin, Direct: 0.1 mg/dL (ref 0.0–0.2)
Indirect Bilirubin: 0.6 mg/dL (ref 0.3–0.9)
Total Bilirubin: 0.7 mg/dL (ref 0.3–1.2)
Total Protein: 7.1 g/dL (ref 6.5–8.1)

## 2018-09-03 LAB — BASIC METABOLIC PANEL
Anion gap: 11 (ref 5–15)
BUN: 18 mg/dL (ref 8–23)
CO2: 21 mmol/L — ABNORMAL LOW (ref 22–32)
Calcium: 9.1 mg/dL (ref 8.9–10.3)
Chloride: 103 mmol/L (ref 98–111)
Creatinine, Ser: 0.96 mg/dL (ref 0.61–1.24)
GFR calc Af Amer: >60 mL/min (ref >60)
GFR calc non Af Amer: >60 mL/min (ref >60)
Glucose, Bld: 151 mg/dL — ABNORMAL HIGH (ref 70–99)
Potassium: 3.8 mmol/L (ref 3.5–5.1)
Sodium: 135 mmol/L (ref 135–145)

## 2018-09-03 LAB — LIPASE, BLOOD: Lipase: 29 U/L (ref 11–51)

## 2018-09-03 LAB — TROPONIN I: Troponin I: <0.03 ng/mL (ref <0.03)

## 2018-09-03 MED ORDER — MORPHINE SULFATE (PF) 4 MG/ML IV SOLN
4.0000 mg | Freq: Once | INTRAVENOUS | Status: AC
  Administered 2018-09-03: 4 mg via INTRAVENOUS
  Filled 2018-09-03: qty 1

## 2018-09-03 MED ORDER — MORPHINE SULFATE (PF) 4 MG/ML IV SOLN
6.0000 mg | Freq: Once | INTRAVENOUS | Status: AC
  Administered 2018-09-03: 6 mg via INTRAVENOUS
  Filled 2018-09-03: qty 2

## 2018-09-03 MED ORDER — SODIUM CHLORIDE 0.9 % IV BOLUS
500.0000 mL | Freq: Once | INTRAVENOUS | Status: AC
  Administered 2018-09-04: 500 mL via INTRAVENOUS

## 2018-09-03 MED ORDER — ONDANSETRON HCL 4 MG/2ML IJ SOLN
4.0000 mg | Freq: Once | INTRAMUSCULAR | Status: AC
  Administered 2018-09-03: 4 mg via INTRAVENOUS
  Filled 2018-09-03: qty 2

## 2018-09-03 NOTE — Consult Note (Signed)
Cardiology Consultation   Patient ID: Andrew [name]; [id number]; [birth year]   Admit date: 09/03/2018 Date of Consult: 09/03/2018  Referring Provider:  Carlisle Cater  Primary Care Provider: Dettinger, Fransisca Kaufmann, MD Cardiologist: Ellyn Hack Electrophysiologist:  NA  Reason for Consultation: CP  History of Present Illness: Andrew [name] is a 68 y.o. male who is being seen today for the evaluation of CP at the request of [facility] ED. He has h/o CAD, multi-vessel dz on last cath in august of 2019, s/p PCI to ramus but w/ residual dz in other vascular territories not amenable to intervention (CTO of dLAD, LCx dz which was unable to be wired at last LHC, and multiple lesions in the R system that were not amenable to PCI). He has been managed medically w/ imdur but at a low dose of 15mg  daily. There has been some discussion by primary cardiologist of increasing imdur to 30mg  daily if angina continues; PCI to RCA has also been discussed as a last resort. Pt describes pain that has been going on for at least a week, waxing and waning. It is midsternal and epigastric, not improved by NTG. There is no associated SOB, nausea, diaphoresis or any other associated sx. The pain does not seem to be made worse by exertion or activity, but is worse w/ deep breathing or cough. The pain is reproducible by palpation of L chest; abd is also TTP with +guarding.  Past Medical History:  Diagnosis Date  . Allergy   . Anemia   . Arthritis   . Cancer [facility])    Bladder  . Depression   . Diabetes mellitus without complication ([place])   . GERD (gastroesophageal reflux disease)   . Hyperlipidemia   . Hypertension    in the past no longer on medication  . Neuromuscular disorder ([place])   . Sleep apnea    BiPAP no used in 3 years  . Status post tendon repair 1989  . STEMI (ST elevation myocardial infarction) ([place]) 01/20/2018   Cardiac cath January 21, 2018: Severe multivessel disease with unclear lesion, but opted for PCI/DES x1 to  the RI.  Appeared to have CTO of the distal LAD and circumflex as well as moderate mid LAD and diagonal disease as well as PDA.Marland Kitchen  Unable to cross circumflex lesion.  . Stroke ([place])    At times pt has dizziness with loss of vision  . Tremors of nervous system 2011    Past Surgical History:  Procedure Laterality Date  . APPENDECTOMY    . BLADDER SURGERY    . COLONOSCOPY    . CORONARY STENT INTERVENTION N/A 01/21/2018   Procedure: CORONARY STENT INTERVENTION;  Surgeon: Leonie Man, MD;  Location: [place] [facility];  Service: Cardiovascular;  Laterality: N/A;  95% Ramus Intermedius -PCI with synergy DES 2.25 mm x 12 mm postdilated 2.4 mm.  [place] ACUTE MI REVASCULARIZATION N/A 01/21/2018   Procedure: Coronary/Graft Acute MI Revascularization;  Surgeon: Leonie Man, MD;  Location: [place] [facility];  Service: Cardiovascular;  Laterality: N/A;  attempted revas to distal CFX;   . CYSTOSCOPY W/ RETROGRADES Bilateral 08/22/2015   Procedure: CYSTOSCOPY WITH RETROGRADE PYELOGRAM;  Surgeon: Irine Seal, MD;  Location: [facility];  Service: Urology;  Laterality: Bilateral;  . CYSTOSCOPY W/ RETROGRADES Bilateral 01/16/2016   Procedure: CYSTOSCOPY WITH RETROGRADE PYELOGRAM;  Surgeon: Irine Seal, MD;  Location: [facility];  Service: Urology;  Laterality: Bilateral;  . CYSTOSCOPY W/ RETROGRADES Bilateral 01/07/2017   Procedure: CYSTOSCOPY WITH RETROGRADE  PYELOGRAM;  Surgeon: Irine Seal, MD;  Location: [facility];  Service: Urology;  Laterality: Bilateral;  . CYSTOSCOPY WITH BIOPSY N/A 08/22/2015   Procedure: CYSTOSCOPY WITH BLADDER BIOPSY;  Surgeon: Irine Seal, MD;  Location: [facility];  Service: Urology;  Laterality: N/A;  . CYSTOSCOPY WITH BIOPSY N/A 01/16/2016   Procedure: CYSTOSCOPY WITH BLADDER BIOPSY;  Surgeon: Irine Seal, MD;  Location: [facility];  Service: Urology;  Laterality: N/A;  . CYSTOSCOPY WITH FULGERATION N/A 01/16/2016   Procedure: CYSTOSCOPY WITH FULGERATION;  Surgeon: Irine Seal, MD;   Location: [facility];  Service: Urology;  Laterality: N/A;  . KNEE ARTHROSCOPY Right 1989  . LEFT HEART CATH AND CORONARY ANGIOGRAPHY N/A 01/21/2018   Procedure: LEFT HEART CATH AND CORONARY ANGIOGRAPHY;  Surgeon: Leonie Man, MD;  Location: [place] [facility];  Service: Cardiovascular;; 95% RI-DES PCI.  80% o-pCX (PTCA) -> dCX 80%-100% CTO (unsuccessful PTCA unable to cross distal lesion with balloon.)  100% CTO dLAD. RPDA 80% and 70% as well as P AV 180%, PA V2 60%.  (too small for PCI)  . ROTATOR CUFF REPAIR Bilateral 2000  . rt. leg fracture surgery    . TRANSTHORACIC ECHOCARDIOGRAM  01/21/2018   Mild LVH.  EF 50 and 55%.  Apical inferior hypokinesis.  Mid-apical anterolateral hypokinesis.  Normal diastolic function for age   . TRANSURETHRAL RESECTION OF BLADDER TUMOR N/A 01/07/2017   Procedure: TRANSURETHRAL RESECTION OF BLADDER TUMOR (TURBT);  Surgeon: Irine Seal, MD;  Location: [facility];  Service: Urology;  Laterality: N/A;  . WISDOM TOOTH EXTRACTION      No current facility-administered medications on file prior to encounter.    Current Outpatient Medications on File Prior to Encounter  Medication Sig Dispense Refill  . aspirin 81 MG tablet Take 81 mg by mouth daily.    . bisoprolol (ZEBETA) 5 MG tablet Take 0.5 tablets (2.5 mg total) by mouth daily. Take at 2pm 30 tablet 1  . budesonide-formoterol (SYMBICORT) 80-4.5 MCG/ACT inhaler Inhale 2 puffs into the lungs 2 (two) times daily. (Patient not taking: Reported on 07/24/2018) 1 Inhaler 3  . empagliflozin (JARDIANCE) 25 MG TABS tablet Take 25 mg by mouth daily. 30 tablet 6  . fenofibrate (TRICOR) 48 MG tablet Take 1 tablet (48 mg total) by mouth daily. 30 tablet 5  . HYDROcodone-acetaminophen (NORCO/VICODIN) 5-325 MG tablet Take 1 tablet by mouth 2 (two) times daily as needed for moderate pain. 35 tablet 0  . HYDROcodone-acetaminophen (NORCO/VICODIN) 5-325 MG tablet Take 1 tablet by mouth 2 (two) times daily as needed for moderate pain. Do  not refill until 30 days from prescription date 35 tablet 0  . HYDROcodone-acetaminophen (NORCO/VICODIN) 5-325 MG tablet Take 1 tablet by mouth 2 (two) times daily as needed for moderate pain. Do not refill until 60 days from prescription date 35 tablet 0  . isosorbide mononitrate (IMDUR) 30 MG 24 hr tablet Take 0.5 tablets (15 mg total) by mouth daily. 23 tablet 1  . lisinopril (PRINIVIL,ZESTRIL) 5 MG tablet Take 1 tablet (5 mg total) by mouth daily. 90 tablet 1  . metFORMIN (GLUCOPHAGE) 1000 MG tablet Take 1 tablet by mouth 2 times daily with a meal. 180 tablet 3  . nitroGLYCERIN (NITROSTAT) 0.4 MG SL tablet Place 1 tablet (0.4 mg total) under the tongue every 5 (five) minutes as needed for chest pain. (Patient not taking: Reported on 08/02/2018) 25 tablet 1  . omeprazole (PRILOSEC) 20 MG capsule TAKE (1) CAPSULE DAILY 90 capsule 0  .  primidone (MYSOLINE) 50 MG tablet Take 1 tablet (50 mg total) by mouth 4 (four) times daily. 360 tablet 1  . rosuvastatin (CRESTOR) 40 MG tablet Take 1 tablet (40 mg total) by mouth daily. 90 tablet 3  . sitaGLIPtin (JANUVIA) 100 MG tablet Take 1 tablet (100 mg total) by mouth daily. 30 tablet 5  . tamsulosin (FLOMAX) 0.4 MG CAPS capsule Take 1 capsule (0.4 mg total) by mouth daily. 90 capsule 1  . ticagrelor (BRILINTA) 90 MG TABS tablet Take 1 tablet (90 mg total) by mouth 2 (two) times daily. 32 tablet 0     Allergies:    Allergies  Allergen Reactions  . Tramadol Shortness Of Breath    And dizziness  . Trulicity [Dulaglutide] Swelling    Social History:   The patient  reports that he has been smoking cigarettes. He has a 21.00 pack-year smoking history. He has never used smokeless tobacco. He reports that he does not drink alcohol or use drugs.    Family History:   The patient's family history includes Cancer in his brother; Dementia in his father; Diabetes in his brother and brother; Diabetes (age of onset: 53) in his mother; Heart attack (age of onset: 14)  in his brother; Stroke in his mother; Testicular cancer in his brother.   ROS:  Please see the history of present illness.  All other ROS reviewed and negative.     Vital Signs: Blood pressure 115/76, pulse 92, temperature 99.6 F (37.6 C), temperature source Oral, resp. rate (!) 21, height 6\' 3"  (1.905 m), weight 93 kg, SpO2 95 %.   PHYSICAL EXAM: General:  Well nourished, well developed, in no acute distress HEENT: normal Lymph: no adenopathy Neck: no JVD Endocrine:  No thryomegaly Vascular: No carotid bruits; DP pulses 2+ bilaterally   Cardiac:  normal S1, S2; RRR; no murmur. CP is reproducible by palpating under L breast Lungs:  clear to auscultation bilaterally, no wheezing, rhonchi or rales  Abd: soft, very tender to palpation diffusely, +guarding Ext: no edema Musculoskeletal:  No deformities, BUE and BLE strength normal and equal Skin: warm and dry  Neuro:  CNs 2-12 intact, no focal abnormalities noted Psych:  Normal affect   EKG:  NSR with early repol, no change from prior, no acute ST changes  Labs: Recent Labs    09/03/18 2154  TROPONINI <0.03   No results for input(s): TROPIPOC in the last 72 hours.  Lab Results  Component Value Date   WBC 13.1 (H) 09/03/2018   HGB 15.0 09/03/2018   HCT 44.7 09/03/2018   MCV 93.1 09/03/2018   PLT 243 09/03/2018   Recent Labs  Lab 09/03/18 2154  NA 135  K 3.8  CL 103  CO2 21*  BUN 18  CREATININE 0.96  CALCIUM 9.1  PROT 7.1  BILITOT 0.7  ALKPHOS 53  ALT 13  AST 14*  GLUCOSE 151*   Lab Results  Component Value Date   CHOL 124 07/24/2018   HDL 39 (L) 07/24/2018   LDLCALC 50 07/24/2018   TRIG 176 (H) 07/24/2018   Lab Results  Component Value Date   DDIMER 2.08 (H) 01/20/2018    Radiology/Studies:  Dg Chest 2 View  Result Date: 09/03/2018 CLINICAL DATA:  68 y/o  M; 1 week of chest pain worsening tonight. EXAM: CHEST - 2 VIEW COMPARISON:  01/20/2018 chest radiograph FINDINGS: Stable cardiac silhouette  within normal limits given projection and technique. Aortic atherosclerosis with calcification. Clear lungs. No pleural  effusion or pneumothorax. No acute osseous abnormality is evident. Partially visualized rotator cuff surgical repair bilaterally. IMPRESSION: No acute pulmonary process identified. Stable cardiac silhouette. Aortic Atherosclerosis (ICD10-I70.0). Electronically Signed   By: Kristine Garbe M.D.   On: 09/03/2018 22:27   01-21-18 LHC  Ost Cx to Prox Cx lesion is 80% stenosed.  Balloon angioplasty was performed using a BALLOON SAPPHIRE 2.0X15. Post intervention, there is a 40% residual stenosis.  Dist Cx-1 lesion is 80% stenosed.  Balloon angioplasty was performed using a BALLOON SAPPHIRE 2.0X15. Post intervention, there is a 60% residual stenosis.  Dist Cx-2 lesion is 100% stenosed. Post intervention, there is a 100% residual stenosis. --Unable to cross the lesion with wire or balloon  ------------  Ramus lesion is 95% stenosed.  A drug-eluting stent was successfully placed using a STENT SYNERGY DES 2.25X12.  Post intervention, there is a 0% residual stenosis.  ---------------------------  Dist (apical) LAD lesion is 100% stenosed. Likely CTO  RPDA-1 lesion is 80% stenosed. RPDA-2 lesion is 70% stenosed.  Ostial Post Atrio-1 lesion is 80% stenosed. Post Atrio-2 lesion is 60% stenosed.  ----------------------------  The left ventricular systolic function is normal. The left ventricular ejection fraction is 50-55% by visual estimate. LV end diastolic pressure is normal.    Severe multivessel disease, unclear what the culprit lesion is.  100% occlusion of the distal circumflex is not likely the culprit lesion as this was not able to be crossed with a wire.  The apical LAD is subtotally occluded and this could also be the source of his pain -not good PCI target  There is a 95% Ramus Intermedius lesion that was treated with DES stent (Synergy DES 2.25 mm x  12 mm postdilated 2.4 mm), without resolution of pain.  There is also ostial RPAV 80-85%.  LV gram suggests apical hypokinesis and perhaps some mild anterolateral hypokinesis as well.  Relatively normal LVEDP.  TTE 01-21-18 Left ventricle: The cavity size was normal. Wall thickness was   increased in a pattern of mild LVH. Systolic function was normal.   The estimated ejection fraction was in the range of 50% to 55%.   There is hypokinesis of the apicalinferior myocardium. There is   hypokinesis of the mid-apicalanterolateral myocardium. Left   ventricular diastolic function parameters were normal for the   patient&'s age. - Aortic valve: Trileaflet; moderately thickened, moderately   calcified leaflets. - Mitral valve: Mildly calcified leaflets . There was trivial   regurgitation. - Right atrium: Central venous pressure (est): 15 mm Hg. - Atrial septum: No defect or patent foramen ovale was identified. - Tricuspid valve: There was trivial regurgitation. - Pulmonary arteries: Systolic pressure could not be accurately   estimated. - Pericardium, extracardiac: There was no pericardial effusion.  ASSESSMENT AND PLAN:  1. CP: somewhat atypical, suggestive of possible PE or abd pathology by hx and exam but pt says similar to prior angina. First trop negative, no acute EKG changes. Recommend cycle trops overnight. Would also recommend continued workup for possible non-cardiac etiologies for pt's current sx. If cardiac enzymes remain negative, and no non-cardiac etiology for pt's pain can be identified, could consider trial of maximization of medical management for angina by increasing chronic imdur dose and/or adding ranexa, vs repeat LHC to reassess for any lesions that could possibly be good targets for revascularization. . Cont DAPT w/ ASA/brilinta. OK to give heparin IV gtt overnight while we cycle trops. Will order updated TTE.  2. HTN/dyslipidemia/DM2: restart home regimen; mgmt as  per  primary team. LDL at goal of <70  Thank you for the opportunity to participate in the care of this patient.  For questions or updates, please contact [place] Please consult [email or web address] for contact info under   Signed, Rudean Curt, MD, [facility] 09/03/2018 11:13 PM

## 2018-09-03 NOTE — ED Provider Notes (Signed)
11:53 PM Approached by Dr. Hassell Done, cardiology who reports that she does not believe patient has ACS.  She reports severe abd tenderness with guarding and rebound on her exam and is concerned about abd etiology.  She additionally reports pt is diaphoretic and writhing in bed.  RN reports same on his initial evaluation of the patient.  Given this, will order CT angio chest/abd/pelvis.  Cardiology will continue to follow, but does not feel pt's pain is cardiac in nature at this time.    BP 98/69   Pulse 88   Temp 99.6 F (37.6 C) (Oral)   Resp (!) 29   Ht 6\' 3"  (1.905 m)   Wt 93 kg   SpO2 94%   BMI 25.62 kg/m    Physical Exam  Constitutional: He appears distressed.  Writhing in bed, distressed  HENT:  Head: Normocephalic.  Eyes: Conjunctivae are normal.  Cardiovascular: Normal rate.  Pulmonary/Chest: No accessory muscle usage. Tachypnea noted.  Abdominal: He exhibits distension. There is generalized abdominal tenderness. There is rebound and guarding.  Musculoskeletal: Normal range of motion.  Neurological: He is alert.  Skin: Skin is warm. He is diaphoretic.  Psychiatric: His mood appears anxious.    Clinical Course as of Sep 04 143  Mon Sep 04, 2018  0004  I personally evaluated the patient.  He is hypotensive in severe pain, clutching his chest.  Pt discussed with Dr. Betsey Holiday.  RN to CT with patient.     [HM]  0045 The patient was discussed with and evaluated by Dr. Betsey Holiday who agrees with the treatment plan.    [HM]  0144 Small pericardial effusion, but no evidence of PE or dissection on CTA.  Pt continues to have pain.  Dr. Jonelle Sidle changed bed request to stepdown.    CT Angio Chest/Abd/Pel for Dissection W and/or Wo Contrast [HM]    Clinical Course User Index [HM] Valente Fosberg, Valora Corporal, PA-C 09/04/18 0145    Orpah Greek, MD 09/04/18 [phone]

## 2018-09-03 NOTE — ED Triage Notes (Signed)
BIB [facility] with c/o of chest pain X 1 week with worsening tonight. Pt states it moved from left chest to mid and feels like "an elephant sitting on my chest." Hx of MI and states it feels the same. Received 3 nitro and 324mg  asp PTA.

## 2018-09-03 NOTE — ED Provider Notes (Addendum)
[facility] EMERGENCY DEPARTMENT Provider Note   CSN: [id number] Arrival date & time: 09/03/18  2143    History   Chief Complaint Chief Complaint  Patient presents with  . Chest Pain    HPI Andrew [name] is a 68 y.o. male.     Patient with known coronary artery disease status post STEMI 12/2017 presents emergency department with chest pain which is been ongoing for the past 1 week however has been worse in the mid chest over the past 8 hours.  Patient describes intense chest pressure similar to when he had a heart attack in the past.  Pain radiates to the epigastrium.  No diaphoresis, vomiting, shortness of breath.  Pain has been fairly steady over the past week however waxing and waning in nature.  Nitro has not really been improving symptoms.  Aspirin given prior to arrival by EMS.  Denies any recent fevers or cough symptoms.  No radiation of pain to back. CT dissection study performed when patient was admitted with ACS in 12/2017 without signs of dissection or aneurysm.      Past Medical History:  Diagnosis Date  . Allergy   . Anemia   . Arthritis   . Cancer [facility])    Bladder  . Depression   . Diabetes mellitus without complication ([place])   . GERD (gastroesophageal reflux disease)   . Hyperlipidemia   . Hypertension    in the past no longer on medication  . Neuromuscular disorder ([place])   . Sleep apnea    BiPAP no used in 3 years  . Status post tendon repair 1989  . STEMI (ST elevation myocardial infarction) ([place]) 01/20/2018   Cardiac cath January 21, 2018: Severe multivessel disease with unclear lesion, but opted for PCI/DES x1 to the RI.  Appeared to have CTO of the distal LAD and circumflex as well as moderate mid LAD and diagonal disease as well as PDA.Marland Kitchen  Unable to cross circumflex lesion.  . Stroke ([place])    At times pt has dizziness with loss of vision  . Tremors of nervous system 2011    Patient Active Problem List   Diagnosis Date Noted  .  Presence of drug coated stent in ramus intermedius coronary artery 05/12/2018  . COPD with acute exacerbation ([place]) 01/21/2018  . Acute non-ST-segment elevation myocardial infarction ([place]) 01/21/2018  . Coronary artery disease involving native coronary artery of native heart with angina pectoris ([place]) 01/21/2018  . Stable angina ([place]) 01/20/2018  . Depression 01/20/2018  . Essential hypertension 01/20/2018  . Sleep apnea 10/06/2016  . Generalized edema 01/15/2016  . Tobacco use disorder 12/19/2015  . Type 2 diabetes mellitus ([place]) 12/19/2015  . Hyperlipidemia associated with type 2 diabetes mellitus ([place]) 12/19/2015  . Bladder cancer ([place]) 12/19/2015  . Esophageal reflux 12/19/2015  . Essential tremor 12/19/2015  . History of stroke 12/19/2015    Past Surgical History:  Procedure Laterality Date  . APPENDECTOMY    . BLADDER SURGERY    . COLONOSCOPY    . CORONARY STENT INTERVENTION N/A 01/21/2018   Procedure: CORONARY STENT INTERVENTION;  Surgeon: Leonie Man, MD;  Location: [place] CV LAB;  Service: Cardiovascular;  Laterality: N/A;  95% Ramus Intermedius -PCI with synergy DES 2.25 mm x 12 mm postdilated 2.4 mm.  Remus Blake ACUTE MI REVASCULARIZATION N/A 01/21/2018   Procedure: Coronary/Graft Acute MI Revascularization;  Surgeon: Leonie Man, MD;  Location: [place] CV LAB;  Service: Cardiovascular;  Laterality: N/A;  attempted revas to distal CFX;   . CYSTOSCOPY W/ RETROGRADES Bilateral 08/22/2015   Procedure: CYSTOSCOPY WITH RETROGRADE PYELOGRAM;  Surgeon: Irine Seal, MD;  Location: [facility];  Service: Urology;  Laterality: Bilateral;  . CYSTOSCOPY W/ RETROGRADES Bilateral 01/16/2016   Procedure: CYSTOSCOPY WITH RETROGRADE PYELOGRAM;  Surgeon: Irine Seal, MD;  Location: [facility];  Service: Urology;  Laterality: Bilateral;  . CYSTOSCOPY W/ RETROGRADES Bilateral 01/07/2017   Procedure: CYSTOSCOPY WITH RETROGRADE PYELOGRAM;  Surgeon: Irine Seal, MD;  Location: [facility];   Service: Urology;  Laterality: Bilateral;  . CYSTOSCOPY WITH BIOPSY N/A 08/22/2015   Procedure: CYSTOSCOPY WITH BLADDER BIOPSY;  Surgeon: Irine Seal, MD;  Location: [facility];  Service: Urology;  Laterality: N/A;  . CYSTOSCOPY WITH BIOPSY N/A 01/16/2016   Procedure: CYSTOSCOPY WITH BLADDER BIOPSY;  Surgeon: Irine Seal, MD;  Location: [facility];  Service: Urology;  Laterality: N/A;  . CYSTOSCOPY WITH FULGERATION N/A 01/16/2016   Procedure: CYSTOSCOPY WITH FULGERATION;  Surgeon: Irine Seal, MD;  Location: [facility];  Service: Urology;  Laterality: N/A;  . KNEE ARTHROSCOPY Right 1989  . LEFT HEART CATH AND CORONARY ANGIOGRAPHY N/A 01/21/2018   Procedure: LEFT HEART CATH AND CORONARY ANGIOGRAPHY;  Surgeon: Leonie Man, MD;  Location: [place] CV LAB;  Service: Cardiovascular;; 95% RI-DES PCI.  80% o-pCX (PTCA) -> dCX 80%-100% CTO (unsuccessful PTCA unable to cross distal lesion with balloon.)  100% CTO dLAD. RPDA 80% and 70% as well as P AV 180%, PA V2 60%.  (too small for PCI)  . ROTATOR CUFF REPAIR Bilateral 2000  . rt. leg fracture surgery    . TRANSTHORACIC ECHOCARDIOGRAM  01/21/2018   Mild LVH.  EF 50 and 55%.  Apical inferior hypokinesis.  Mid-apical anterolateral hypokinesis.  Normal diastolic function for age   . TRANSURETHRAL RESECTION OF BLADDER TUMOR N/A 01/07/2017   Procedure: TRANSURETHRAL RESECTION OF BLADDER TUMOR (TURBT);  Surgeon: Irine Seal, MD;  Location: [facility];  Service: Urology;  Laterality: N/A;  . WISDOM TOOTH EXTRACTION          Home Medications    Prior to Admission medications   Medication Sig Start Date End Date Taking? Authorizing Provider  aspirin 81 MG tablet Take 81 mg by mouth daily.    [provider]  bisoprolol (ZEBETA) 5 MG tablet Take 0.5 tablets (2.5 mg total) by mouth daily. Take at 2pm 05/15/18   Eustaquio Maize, MD  budesonide-formoterol [facility]) 80-4.5 MCG/ACT inhaler Inhale 2 puffs into the lungs 2 (two) times daily. Patient not taking:  Reported on 07/24/2018 02/24/18   Eustaquio Maize, MD  empagliflozin (JARDIANCE) 25 MG TABS tablet Take 25 mg by mouth daily. 02/22/18   Eustaquio Maize, MD  fenofibrate (TRICOR) 48 MG tablet Take 1 tablet (48 mg total) by mouth daily. 08/08/18   [facility], Tami Lin, PA  HYDROcodone-acetaminophen (NORCO/VICODIN) 5-325 MG tablet Take 1 tablet by mouth 2 (two) times daily as needed for moderate pain. 07/10/18   Dettinger, Fransisca Kaufmann, MD  HYDROcodone-acetaminophen (NORCO/VICODIN) 5-325 MG tablet Take 1 tablet by mouth 2 (two) times daily as needed for moderate pain. Do not refill until 30 days from prescription date 07/10/18   Dettinger, Fransisca Kaufmann, MD  HYDROcodone-acetaminophen (NORCO/VICODIN) 5-325 MG tablet Take 1 tablet by mouth 2 (two) times daily as needed for moderate pain. Do not refill until 60 days from prescription date 07/10/18   Dettinger, Fransisca Kaufmann, MD  isosorbide mononitrate (IMDUR) 30 MG 24 hr tablet Take 0.5 tablets (15  mg total) by mouth daily. 07/10/18 08/24/18  Dettinger, Fransisca Kaufmann, MD  lisinopril (PRINIVIL,ZESTRIL) 5 MG tablet Take 1 tablet (5 mg total) by mouth daily. 04/10/18   Eustaquio Maize, MD  metFORMIN (GLUCOPHAGE) 1000 MG tablet Take 1 tablet by mouth 2 times daily with a meal. 07/10/18   Dettinger, Fransisca Kaufmann, MD  nitroGLYCERIN (NITROSTAT) 0.4 MG SL tablet Place 1 tablet (0.4 mg total) under the tongue every 5 (five) minutes as needed for chest pain. Patient not taking: Reported on 08/02/2018 07/10/18   Dettinger, Fransisca Kaufmann, MD  omeprazole (PRILOSEC) 20 MG capsule TAKE (1) CAPSULE DAILY 08/09/18   Dettinger, Fransisca Kaufmann, MD  primidone (MYSOLINE) 50 MG tablet Take 1 tablet (50 mg total) by mouth 4 (four) times daily. 01/06/18   Eustaquio Maize, MD  rosuvastatin (CRESTOR) 40 MG tablet Take 1 tablet (40 mg total) by mouth daily. 08/01/18 10/30/18  Leonie Man, MD  sitaGLIPtin (JANUVIA) 100 MG tablet Take 1 tablet (100 mg total) by mouth daily. 01/06/18   Eustaquio Maize, MD  tamsulosin (FLOMAX) 0.4  MG CAPS capsule Take 1 capsule (0.4 mg total) by mouth daily. 04/10/18   Eustaquio Maize, MD  ticagrelor (BRILINTA) 90 MG TABS tablet Take 1 tablet (90 mg total) by mouth 2 (two) times daily. 08/02/18   Leonie Man, MD    Family History Family History  Problem Relation Age of Onset  . Diabetes Mother 78       type 1  . Stroke Mother   . Dementia Father   . Diabetes Brother   . Heart attack Brother 26  . Testicular cancer Brother   . Diabetes Brother   . Cancer Brother        testicular  . Colon cancer Neg Hx   . Colon polyps Neg Hx   . Esophageal cancer Neg Hx   . Rectal cancer Neg Hx   . Stomach cancer Neg Hx     Social History Social History   Tobacco Use  . Smoking status: Current Every Day Smoker    Packs/day: 0.50    Years: 42.00    Pack years: 21.00    Types: Cigarettes  . Smokeless tobacco: Never Used  Substance Use Topics  . Alcohol use: No    Comment: rarely  . Drug use: No     Allergies   Tramadol and Trulicity [dulaglutide]   Review of Systems Review of Systems  Constitutional: Negative for diaphoresis and fever.  Eyes: Negative for redness.  Respiratory: Negative for cough and shortness of breath.   Cardiovascular: Positive for chest pain. Negative for palpitations and leg swelling.  Gastrointestinal: Positive for abdominal pain. Negative for nausea and vomiting.  Genitourinary: Negative for dysuria.  Musculoskeletal: Negative for back pain and neck pain.  Skin: Negative for rash.  Neurological: Negative for syncope and light-headedness.  Psychiatric/Behavioral: The patient is not nervous/anxious.      Physical Exam Updated Vital Signs BP 115/76   Pulse 92   Temp 99.6 F (37.6 C) (Oral)   Resp (!) 21   Ht 6\' 3"  (1.905 m)   Wt 93 kg   SpO2 95%   BMI 25.62 kg/m   Physical Exam Vitals signs and nursing note reviewed.  Constitutional:      General: He is in acute distress (uncomfortable appearing).     Appearance: He is  well-developed. He is not diaphoretic.  HENT:     Head: Normocephalic and atraumatic.  Mouth/Throat:     Mouth: Mucous membranes are not dry.  Eyes:     Conjunctiva/sclera: Conjunctivae normal.  Neck:     Musculoskeletal: Normal range of motion and neck supple. No muscular tenderness.     Vascular: Normal carotid pulses. No carotid bruit or JVD.     Trachea: Trachea normal. No tracheal deviation.  Cardiovascular:     Rate and Rhythm: Normal rate and regular rhythm.     Pulses: No decreased pulses.          Radial pulses are 2+ on the right side and 2+ on the left side.     Heart sounds: Normal heart sounds, S1 normal and S2 normal. Heart sounds not distant. No murmur.  Pulmonary:     Effort: Pulmonary effort is normal. No respiratory distress.     Breath sounds: Normal breath sounds. No wheezing.  Chest:     Chest wall: No tenderness.  Abdominal:     General: Bowel sounds are normal.     Palpations: Abdomen is soft.     Tenderness: There is abdominal tenderness in the right upper quadrant, epigastric area and left upper quadrant. There is no guarding or rebound.     Comments: Moderate upper abd tenderness, diffusely.   Skin:    General: Skin is warm and dry.     Coloration: Skin is not pale.  Neurological:     Mental Status: He is alert.      ED Treatments / Results  Labs (all labs ordered are listed, but only abnormal results are displayed) Labs Reviewed  BASIC METABOLIC PANEL - Abnormal; Notable for the following components:      Result Value   CO2 21 (*)    Glucose, Bld 151 (*)    All other components within normal limits  CBC - Abnormal; Notable for the following components:   WBC 13.1 (*)    All other components within normal limits  HEPATIC FUNCTION PANEL - Abnormal; Notable for the following components:   AST 14 (*)    All other components within normal limits  TROPONIN I  LIPASE, BLOOD    ED ECG REPORT   Date: 09/03/2018  Rate: 97  Rhythm: normal  sinus rhythm  QRS Axis: normal  Intervals: normal  ST/T Wave abnormalities: nonspecific ST changes  Conduction Disutrbances:none  Narrative Interpretation:   Old EKG Reviewed: unchanged from 07/2018  I have personally reviewed the EKG tracing and agree with the computerized printout as noted.  Radiology Dg Chest 2 View  Result Date: 09/03/2018 CLINICAL DATA:  68 y/o  M; 1 week of chest pain worsening tonight. EXAM: CHEST - 2 VIEW COMPARISON:  01/20/2018 chest radiograph FINDINGS: Stable cardiac silhouette within normal limits given projection and technique. Aortic atherosclerosis with calcification. Clear lungs. No pleural effusion or pneumothorax. No acute osseous abnormality is evident. Partially visualized rotator cuff surgical repair bilaterally. IMPRESSION: No acute pulmonary process identified. Stable cardiac silhouette. Aortic Atherosclerosis (ICD10-I70.0). Electronically Signed   By: Kristine Garbe M.D.   On: 09/03/2018 22:27    Procedures Procedures (including critical care time)  Medications Ordered in ED Medications  morphine 4 MG/ML injection 6 mg (6 mg Intravenous Given 09/03/18 2223)  ondansetron (ZOFRAN) injection 4 mg (4 mg Intravenous Given 09/03/18 2223)  morphine 4 MG/ML injection 4 mg (4 mg Intravenous Given 09/03/18 2252)     Initial Impression / Assessment and Plan / ED Course  I have reviewed the triage vital signs and the nursing notes.  Pertinent labs & imaging results that were available during my care of the patient were reviewed by me and considered in my medical decision making (see chart for details).  Clinical Course as of Sep 04 1547  Mon Sep 04, 2018  0004  I personally evaluated the patient.  He is hypotensive in severe pain, clutching his chest.  Pt discussed with Dr. Betsey Holiday.  RN to CT with patient.     [HM]  0045 The patient was discussed with and evaluated by Dr. Betsey Holiday who agrees with the treatment plan.    [HM]  0144 Small  pericardial effusion, but no evidence of PE or dissection on CTA.  Pt continues to have pain.  Dr. Jonelle Sidle changed bed request to stepdown.    CT Angio Chest/Abd/Pel for Dissection W and/or Wo Contrast [HM]    Clinical Course User Index [HM] Muthersbaugh, Jarrett Soho, PA-C       Patient seen and examined. Work-up initiated. Medications ordered.  EKG reviewed upon arrival.  No signs of STEMI, however concern for ACS.  Patient has received aspirin.  We will continue to monitor closely.  Vital signs reviewed and are as follows: BP 115/76   Pulse 92   Temp 99.6 F (37.6 C) (Oral)   Resp (!) 21   Ht 6\' 3"  (1.905 m)   Wt 93 kg   SpO2 95%   BMI 25.62 kg/m   Discussed patient with Dr. Alvino Chapel. We reviewed results of previous dissection CT and do not feel the need to repeat tonight.   11:00 PM Serial EKG reviewed by Dr. Alvino Chapel.   Troponin <0.03, feel admission warranted given known CAD and ongoing pain.   11:35 PM Spoke with cardiology fellow. They have agreed to consult on patient.   Dr. Alvino Chapel has seen patient in the interim.   Spoke with Dr. Jonelle Sidle who will see patient and admit for chest pain evaluation.   Final Clinical Impressions(s) / ED Diagnoses   Final diagnoses:  Precordial pain   High risk CP, admit for rule-out and further eval.   ED Discharge Orders    None       Carlisle Cater, PA-C 09/03/18 2337    Carlisle Cater, PA-C 09/04/18 1550    Davonna Belling, MD 09/04/18 2225

## 2018-09-04 ENCOUNTER — Emergency Department (HOSPITAL_COMMUNITY): Payer: Medicare Other

## 2018-09-04 ENCOUNTER — Inpatient Hospital Stay (HOSPITAL_COMMUNITY): Payer: Medicare Other

## 2018-09-04 ENCOUNTER — Encounter (HOSPITAL_COMMUNITY)
Admission: EM | Disposition: A | Discharge status: Home / Self Care | Payer: Self-pay | Source: Home / Self Care | Attending: Cardiovascular Disease

## 2018-09-04 DIAGNOSIS — Z955 Presence of coronary angioplasty implant and graft: Secondary | ICD-10-CM | POA: Diagnosis not present

## 2018-09-04 DIAGNOSIS — E785 Hyperlipidemia, unspecified: Secondary | ICD-10-CM

## 2018-09-04 DIAGNOSIS — Z79899 Other long term (current) drug therapy: Secondary | ICD-10-CM | POA: Diagnosis not present

## 2018-09-04 DIAGNOSIS — R0789 Other chest pain: Secondary | ICD-10-CM

## 2018-09-04 DIAGNOSIS — F17200 Nicotine dependence, unspecified, uncomplicated: Secondary | ICD-10-CM

## 2018-09-04 DIAGNOSIS — I313 Pericardial effusion (noninflammatory): Secondary | ICD-10-CM | POA: Diagnosis present

## 2018-09-04 DIAGNOSIS — I2111 ST elevation (STEMI) myocardial infarction involving right coronary artery: Secondary | ICD-10-CM | POA: Diagnosis not present

## 2018-09-04 DIAGNOSIS — I959 Hypotension, unspecified: Secondary | ICD-10-CM | POA: Diagnosis present

## 2018-09-04 DIAGNOSIS — F329 Major depressive disorder, single episode, unspecified: Secondary | ICD-10-CM | POA: Diagnosis present

## 2018-09-04 DIAGNOSIS — I2510 Atherosclerotic heart disease of native coronary artery without angina pectoris: Secondary | ICD-10-CM

## 2018-09-04 DIAGNOSIS — I2119 ST elevation (STEMI) myocardial infarction involving other coronary artery of inferior wall: Secondary | ICD-10-CM | POA: Diagnosis present

## 2018-09-04 DIAGNOSIS — I200 Unstable angina: Secondary | ICD-10-CM | POA: Insufficient documentation

## 2018-09-04 DIAGNOSIS — R079 Chest pain, unspecified: Secondary | ICD-10-CM

## 2018-09-04 DIAGNOSIS — Z888 Allergy status to other drugs, medicaments and biological substances status: Secondary | ICD-10-CM | POA: Diagnosis not present

## 2018-09-04 DIAGNOSIS — Z8551 Personal history of malignant neoplasm of bladder: Secondary | ICD-10-CM | POA: Diagnosis not present

## 2018-09-04 DIAGNOSIS — R072 Precordial pain: Secondary | ICD-10-CM | POA: Diagnosis not present

## 2018-09-04 DIAGNOSIS — F17210 Nicotine dependence, cigarettes, uncomplicated: Secondary | ICD-10-CM | POA: Diagnosis present

## 2018-09-04 DIAGNOSIS — I309 Acute pericarditis, unspecified: Secondary | ICD-10-CM | POA: Diagnosis not present

## 2018-09-04 DIAGNOSIS — E1169 Type 2 diabetes mellitus with other specified complication: Secondary | ICD-10-CM | POA: Diagnosis present

## 2018-09-04 DIAGNOSIS — Z823 Family history of stroke: Secondary | ICD-10-CM | POA: Diagnosis not present

## 2018-09-04 DIAGNOSIS — I10 Essential (primary) hypertension: Secondary | ICD-10-CM

## 2018-09-04 DIAGNOSIS — J441 Chronic obstructive pulmonary disease with (acute) exacerbation: Secondary | ICD-10-CM | POA: Diagnosis present

## 2018-09-04 DIAGNOSIS — Z20828 Contact with and (suspected) exposure to other viral communicable diseases: Secondary | ICD-10-CM | POA: Diagnosis present

## 2018-09-04 DIAGNOSIS — G4730 Sleep apnea, unspecified: Secondary | ICD-10-CM | POA: Diagnosis present

## 2018-09-04 DIAGNOSIS — Z8673 Personal history of transient ischemic attack (TIA), and cerebral infarction without residual deficits: Secondary | ICD-10-CM | POA: Diagnosis not present

## 2018-09-04 DIAGNOSIS — R05 Cough: Secondary | ICD-10-CM | POA: Diagnosis not present

## 2018-09-04 DIAGNOSIS — I252 Old myocardial infarction: Secondary | ICD-10-CM | POA: Diagnosis not present

## 2018-09-04 DIAGNOSIS — Z885 Allergy status to narcotic agent status: Secondary | ICD-10-CM | POA: Diagnosis not present

## 2018-09-04 DIAGNOSIS — E1151 Type 2 diabetes mellitus with diabetic peripheral angiopathy without gangrene: Secondary | ICD-10-CM | POA: Diagnosis present

## 2018-09-04 DIAGNOSIS — Z8249 Family history of ischemic heart disease and other diseases of the circulatory system: Secondary | ICD-10-CM | POA: Diagnosis not present

## 2018-09-04 DIAGNOSIS — K219 Gastro-esophageal reflux disease without esophagitis: Secondary | ICD-10-CM | POA: Diagnosis present

## 2018-09-04 DIAGNOSIS — Z7982 Long term (current) use of aspirin: Secondary | ICD-10-CM | POA: Diagnosis not present

## 2018-09-04 DIAGNOSIS — I25119 Atherosclerotic heart disease of native coronary artery with unspecified angina pectoris: Secondary | ICD-10-CM | POA: Diagnosis present

## 2018-09-04 DIAGNOSIS — Z833 Family history of diabetes mellitus: Secondary | ICD-10-CM | POA: Diagnosis not present

## 2018-09-04 HISTORY — PX: LEFT HEART CATH AND CORONARY ANGIOGRAPHY: CATH118249

## 2018-09-04 HISTORY — PX: CORONARY/GRAFT ACUTE MI REVASCULARIZATION: CATH118305

## 2018-09-04 HISTORY — PX: TRANSTHORACIC ECHOCARDIOGRAM: SHX275

## 2018-09-04 LAB — MAGNESIUM: Magnesium: 2.1 mg/dL (ref 1.7–2.4)

## 2018-09-04 LAB — CREATININE, SERUM
Creatinine, Ser: 0.85 mg/dL (ref 0.61–1.24)
GFR calc Af Amer: >60 mL/min (ref >60)
GFR calc non Af Amer: >60 mL/min (ref >60)

## 2018-09-04 LAB — LIPID PANEL
Cholesterol: 95 mg/dL (ref 0–200)
HDL: 45 mg/dL (ref >40)
LDL Cholesterol: 39 mg/dL (ref 0–99)
Total CHOL/HDL Ratio: 2.1 RATIO
Triglycerides: 56 mg/dL (ref <150)
VLDL: 11 mg/dL (ref 0–40)

## 2018-09-04 LAB — URINALYSIS, ROUTINE W REFLEX MICROSCOPIC
Bacteria, UA: NONE SEEN
Bilirubin Urine: NEGATIVE
Glucose, UA: >=500 mg/dL — AB
Ketones, ur: NEGATIVE mg/dL
Leukocytes,Ua: NEGATIVE
Nitrite: NEGATIVE
Protein, ur: NEGATIVE mg/dL
Specific Gravity, Urine: 1.028 (ref 1.005–1.030)
pH: 5 (ref 5.0–8.0)

## 2018-09-04 LAB — CBC
HCT: 44.6 % (ref 39.0–52.0)
Hemoglobin: 14.5 g/dL (ref 13.0–17.0)
MCH: 30 pg (ref 26.0–34.0)
MCHC: 32.5 g/dL (ref 30.0–36.0)
MCV: 92.3 fL (ref 80.0–100.0)
Platelets: 212 10*3/uL (ref 150–400)
RBC: 4.83 MIL/uL (ref 4.22–5.81)
RDW: 12.2 % (ref 11.5–15.5)
WBC: 15.5 10*3/uL — ABNORMAL HIGH (ref 4.0–10.5)
nRBC: 0 % (ref 0.0–0.2)

## 2018-09-04 LAB — HEMOGLOBIN A1C
Hgb A1c MFr Bld: 8 % — ABNORMAL HIGH (ref 4.8–5.6)
Mean Plasma Glucose: 183 mg/dL

## 2018-09-04 LAB — TROPONIN I
Troponin I: <0.03 ng/mL (ref <0.03)
Troponin I: <0.03 ng/mL (ref <0.03)
Troponin I: <0.03 ng/mL (ref <0.03)
Troponin I: <0.03 ng/mL (ref <0.03)

## 2018-09-04 LAB — BASIC METABOLIC PANEL
Anion gap: 11 (ref 5–15)
BUN: 16 mg/dL (ref 8–23)
CO2: 18 mmol/L — ABNORMAL LOW (ref 22–32)
Calcium: 9.2 mg/dL (ref 8.9–10.3)
Chloride: 107 mmol/L (ref 98–111)
Creatinine, Ser: 0.82 mg/dL (ref 0.61–1.24)
GFR calc Af Amer: >60 mL/min (ref >60)
GFR calc non Af Amer: >60 mL/min (ref >60)
Glucose, Bld: 181 mg/dL — ABNORMAL HIGH (ref 70–99)
Potassium: 4.4 mmol/L (ref 3.5–5.1)
Sodium: 136 mmol/L (ref 135–145)

## 2018-09-04 LAB — POCT ACTIVATED CLOTTING TIME
Activated Clotting Time: 235 seconds
Activated Clotting Time: 235 seconds

## 2018-09-04 LAB — ECHOCARDIOGRAM LIMITED
Height: 75 in
Weight: 3280 oz

## 2018-09-04 LAB — CBG MONITORING, ED
Glucose-Capillary: 167 mg/dL — ABNORMAL HIGH (ref 70–99)
Glucose-Capillary: 174 mg/dL — ABNORMAL HIGH (ref 70–99)

## 2018-09-04 LAB — I-STAT CREATININE, ED: Creatinine, Ser: 0.7 mg/dL (ref 0.61–1.24)

## 2018-09-04 LAB — GLUCOSE, CAPILLARY
Glucose-Capillary: 217 mg/dL — ABNORMAL HIGH (ref 70–99)
Glucose-Capillary: 158 mg/dL — ABNORMAL HIGH (ref 70–99)
Glucose-Capillary: 211 mg/dL — ABNORMAL HIGH (ref 70–99)

## 2018-09-04 LAB — MRSA PCR SCREENING: MRSA by PCR: NEGATIVE

## 2018-09-04 SURGERY — LEFT HEART CATH AND CORONARY ANGIOGRAPHY
Anesthesia: LOCAL

## 2018-09-04 MED ORDER — HYDROCODONE-ACETAMINOPHEN 5-325 MG PO TABS
1.0000 | ORAL_TABLET | Freq: Two times a day (BID) | ORAL | Status: DC | PRN
  Administered 2018-09-05: 1 via ORAL
  Filled 2018-09-04: qty 1

## 2018-09-04 MED ORDER — MIDAZOLAM HCL 2 MG/2ML IJ SOLN
INTRAMUSCULAR | Status: DC | PRN
  Administered 2018-09-04: 1 mg via INTRAVENOUS

## 2018-09-04 MED ORDER — ENOXAPARIN SODIUM 40 MG/0.4ML ~~LOC~~ SOLN
40.0000 mg | SUBCUTANEOUS | Status: DC
  Administered 2018-09-05: 40 mg via SUBCUTANEOUS
  Filled 2018-09-04: qty 0.4

## 2018-09-04 MED ORDER — LISINOPRIL 5 MG PO TABS
5.0000 mg | ORAL_TABLET | Freq: Every day | ORAL | Status: DC
  Administered 2018-09-04 – 2018-09-05 (×2): 5 mg via ORAL
  Filled 2018-09-04 (×2): qty 1

## 2018-09-04 MED ORDER — LIDOCAINE HCL (PF) 1 % IJ SOLN
INTRAMUSCULAR | Status: AC
  Filled 2018-09-04: qty 30

## 2018-09-04 MED ORDER — TICAGRELOR 90 MG PO TABS
ORAL_TABLET | ORAL | Status: DC | PRN
  Administered 2018-09-04: 90 mg via ORAL

## 2018-09-04 MED ORDER — SODIUM CHLORIDE 0.9 % IV SOLN
INTRAVENOUS | Status: AC | PRN
  Administered 2018-09-04: 100 mL/h via INTRAVENOUS

## 2018-09-04 MED ORDER — CANAGLIFLOZIN 100 MG PO TABS: 100.0000 mg | ORAL_TABLET | Freq: Every day | ORAL | Status: DC

## 2018-09-04 MED ORDER — INSULIN ASPART 100 UNIT/ML ~~LOC~~ SOLN
0.0000 [IU] | Freq: Every day | SUBCUTANEOUS | Status: DC
  Administered 2018-09-04: 2 [IU] via SUBCUTANEOUS

## 2018-09-04 MED ORDER — ISOSORBIDE MONONITRATE ER 30 MG PO TB24
15.0000 mg | ORAL_TABLET | Freq: Every day | ORAL | Status: DC
  Administered 2018-09-04 – 2018-09-05 (×2): 15 mg via ORAL
  Filled 2018-09-04 (×2): qty 1

## 2018-09-04 MED ORDER — TAMSULOSIN HCL 0.4 MG PO CAPS
0.4000 mg | ORAL_CAPSULE | Freq: Every day | ORAL | Status: DC
  Administered 2018-09-04 – 2018-09-05 (×2): 0.4 mg via ORAL
  Filled 2018-09-04 (×2): qty 1

## 2018-09-04 MED ORDER — ONDANSETRON HCL 4 MG/2ML IJ SOLN: 4.0000 mg | Freq: Four times a day (QID) | INTRAMUSCULAR | Status: DC | PRN

## 2018-09-04 MED ORDER — NITROGLYCERIN 0.4 MG SL SUBL: 0.4000 mg | SUBLINGUAL_TABLET | SUBLINGUAL | Status: DC | PRN

## 2018-09-04 MED ORDER — ENOXAPARIN SODIUM 100 MG/ML ~~LOC~~ SOLN
90.0000 mg | Freq: Two times a day (BID) | SUBCUTANEOUS | Status: DC
  Administered 2018-09-04: 90 mg via SUBCUTANEOUS
  Filled 2018-09-04 (×2): qty 0.9

## 2018-09-04 MED ORDER — MORPHINE SULFATE (PF) 10 MG/ML IV SOLN: 2.0000 mg | INTRAVENOUS | Status: DC | PRN

## 2018-09-04 MED ORDER — NITROGLYCERIN 1 MG/10 ML FOR IR/CATH LAB
INTRA_ARTERIAL | Status: AC
  Filled 2018-09-04: qty 10

## 2018-09-04 MED ORDER — SODIUM CHLORIDE 0.9% FLUSH
3.0000 mL | Freq: Two times a day (BID) | INTRAVENOUS | Status: DC
  Administered 2018-09-04 – 2018-09-05 (×2): 3 mL via INTRAVENOUS

## 2018-09-04 MED ORDER — SODIUM CHLORIDE 0.9 % IV SOLN
250.0000 mL | INTRAVENOUS | Status: DC | PRN
  Administered 2018-09-04: 250 mL via INTRAVENOUS

## 2018-09-04 MED ORDER — TICAGRELOR 90 MG PO TABS
90.0000 mg | ORAL_TABLET | Freq: Two times a day (BID) | ORAL | Status: DC
  Administered 2018-09-04 – 2018-09-05 (×2): 90 mg via ORAL
  Filled 2018-09-04 (×2): qty 1

## 2018-09-04 MED ORDER — LIDOCAINE HCL (PF) 1 % IJ SOLN
INTRAMUSCULAR | Status: DC | PRN
  Administered 2018-09-04: 2 mL

## 2018-09-04 MED ORDER — MORPHINE SULFATE (PF) 2 MG/ML IV SOLN
2.0000 mg | INTRAVENOUS | Status: DC | PRN
  Administered 2018-09-04: 2 mg via INTRAVENOUS
  Filled 2018-09-04: qty 1

## 2018-09-04 MED ORDER — TICAGRELOR 90 MG PO TABS
ORAL_TABLET | ORAL | Status: AC
  Filled 2018-09-04: qty 1

## 2018-09-04 MED ORDER — MIDAZOLAM HCL 2 MG/2ML IJ SOLN
INTRAMUSCULAR | Status: AC
  Filled 2018-09-04: qty 2

## 2018-09-04 MED ORDER — HEPARIN SODIUM (PORCINE) 1000 UNIT/ML IJ SOLN
INTRAMUSCULAR | Status: DC | PRN
  Administered 2018-09-04: 8000 [IU] via INTRAVENOUS
  Administered 2018-09-04: 4000 [IU] via INTRAVENOUS

## 2018-09-04 MED ORDER — PANTOPRAZOLE SODIUM 40 MG PO TBEC
40.0000 mg | DELAYED_RELEASE_TABLET | Freq: Every day | ORAL | Status: DC
  Administered 2018-09-04 – 2018-09-05 (×2): 40 mg via ORAL
  Filled 2018-09-04 (×2): qty 1

## 2018-09-04 MED ORDER — INSULIN ASPART 100 UNIT/ML ~~LOC~~ SOLN
0.0000 [IU] | Freq: Three times a day (TID) | SUBCUTANEOUS | Status: DC
  Administered 2018-09-04: 3 [IU] via SUBCUTANEOUS
  Administered 2018-09-04 – 2018-09-05 (×2): 2 [IU] via SUBCUTANEOUS
  Administered 2018-09-05: 1 [IU] via SUBCUTANEOUS

## 2018-09-04 MED ORDER — NITROGLYCERIN 1 MG/10 ML FOR IR/CATH LAB
INTRA_ARTERIAL | Status: DC | PRN
  Administered 2018-09-04: 100 ug via INTRACORONARY

## 2018-09-04 MED ORDER — PRIMIDONE 50 MG PO TABS
50.0000 mg | ORAL_TABLET | Freq: Four times a day (QID) | ORAL | Status: DC
  Administered 2018-09-04 – 2018-09-05 (×6): 50 mg via ORAL
  Filled 2018-09-04 (×8): qty 1

## 2018-09-04 MED ORDER — VERAPAMIL HCL 2.5 MG/ML IV SOLN
INTRAVENOUS | Status: AC
  Filled 2018-09-04: qty 2

## 2018-09-04 MED ORDER — HEPARIN (PORCINE) IN NACL 1000-0.9 UT/500ML-% IV SOLN
INTRAVENOUS | Status: AC
  Filled 2018-09-04: qty 1000

## 2018-09-04 MED ORDER — ALPRAZOLAM 0.25 MG PO TABS
0.2500 mg | ORAL_TABLET | Freq: Two times a day (BID) | ORAL | Status: DC | PRN
  Administered 2018-09-04: 0.25 mg via ORAL
  Filled 2018-09-04: qty 1

## 2018-09-04 MED ORDER — FENTANYL CITRATE (PF) 100 MCG/2ML IJ SOLN
INTRAMUSCULAR | Status: AC
  Filled 2018-09-04: qty 2

## 2018-09-04 MED ORDER — SODIUM CHLORIDE 0.9 % WEIGHT BASED INFUSION
1.0000 mL/kg/h | INTRAVENOUS | Status: AC
  Administered 2018-09-04: 1 mL/kg/h via INTRAVENOUS

## 2018-09-04 MED ORDER — ROSUVASTATIN CALCIUM 20 MG PO TABS
40.0000 mg | ORAL_TABLET | Freq: Every day | ORAL | Status: DC
  Administered 2018-09-04 – 2018-09-05 (×2): 40 mg via ORAL
  Filled 2018-09-04 (×2): qty 2

## 2018-09-04 MED ORDER — BISOPROLOL FUMARATE 5 MG PO TABS
2.5000 mg | ORAL_TABLET | Freq: Every day | ORAL | Status: DC
  Administered 2018-09-04 – 2018-09-05 (×2): 2.5 mg via ORAL
  Filled 2018-09-04 (×2): qty 1

## 2018-09-04 MED ORDER — IOHEXOL 350 MG/ML SOLN
100.0000 mL | Freq: Once | INTRAVENOUS | Status: AC | PRN
  Administered 2018-09-04: 100 mL via INTRAVENOUS

## 2018-09-04 MED ORDER — VERAPAMIL HCL 2.5 MG/ML IV SOLN
INTRAVENOUS | Status: DC | PRN
  Administered 2018-09-04: 09:00:00 10 mL via INTRA_ARTERIAL

## 2018-09-04 MED ORDER — ACETAMINOPHEN 325 MG PO TABS: 650.0000 mg | ORAL_TABLET | ORAL | Status: DC | PRN

## 2018-09-04 MED ORDER — ASPIRIN EC 81 MG PO TBEC
81.0000 mg | DELAYED_RELEASE_TABLET | Freq: Every day | ORAL | Status: DC
  Administered 2018-09-04 – 2018-09-05 (×2): 81 mg via ORAL
  Filled 2018-09-04 (×2): qty 1

## 2018-09-04 MED ORDER — FENOFIBRATE 54 MG PO TABS
54.0000 mg | ORAL_TABLET | Freq: Every day | ORAL | Status: DC
  Administered 2018-09-04 – 2018-09-05 (×2): 54 mg via ORAL
  Filled 2018-09-04 (×2): qty 1

## 2018-09-04 MED ORDER — LABETALOL HCL 5 MG/ML IV SOLN: 10.0000 mg | INTRAVENOUS | Status: AC | PRN

## 2018-09-04 MED ORDER — HYDRALAZINE HCL 20 MG/ML IJ SOLN: 10.0000 mg | INTRAMUSCULAR | Status: AC | PRN

## 2018-09-04 MED ORDER — SODIUM CHLORIDE 0.9% FLUSH: 3.0000 mL | INTRAVENOUS | Status: DC | PRN

## 2018-09-04 MED ORDER — HEPARIN (PORCINE) IN NACL 1000-0.9 UT/500ML-% IV SOLN
INTRAVENOUS | Status: DC | PRN
  Administered 2018-09-04 (×2): 500 mL

## 2018-09-04 MED ORDER — HEPARIN SODIUM (PORCINE) 1000 UNIT/ML IJ SOLN
INTRAMUSCULAR | Status: AC
  Filled 2018-09-04: qty 1

## 2018-09-04 MED ORDER — COLCHICINE 0.6 MG PO TABS
0.6000 mg | ORAL_TABLET | Freq: Two times a day (BID) | ORAL | Status: DC
  Administered 2018-09-04 – 2018-09-05 (×3): 0.6 mg via ORAL
  Filled 2018-09-04 (×3): qty 1

## 2018-09-04 MED ORDER — FENTANYL CITRATE (PF) 100 MCG/2ML IJ SOLN
INTRAMUSCULAR | Status: DC | PRN
  Administered 2018-09-04: 25 ug via INTRAVENOUS

## 2018-09-04 MED ORDER — TICAGRELOR 90 MG PO TABS
90.0000 mg | ORAL_TABLET | Freq: Once | ORAL | Status: DC
  Filled 2018-09-04: qty 1

## 2018-09-04 MED ORDER — IOHEXOL 350 MG/ML SOLN
INTRAVENOUS | Status: DC | PRN
  Administered 2018-09-04: 145 mL

## 2018-09-04 SURGICAL SUPPLY — 18 items
BALLN SAPPHIRE 2.0X12 (BALLOONS) ×2
BALLOON SAPPHIRE 2.0X12 (BALLOONS) ×1 IMPLANT
CATH 5FR JL3.5 JR4 ANG PIG MP (CATHETERS) ×2 IMPLANT
CATH LAUNCHER 6FR AL1 (CATHETERS) ×1 IMPLANT
CATH LAUNCHER 6FR EBU3.5 (CATHETERS) ×2 IMPLANT
CATHETER LAUNCHER 6FR AL1 (CATHETERS) ×2
COVER DOME SNAP 22 D (MISCELLANEOUS) ×2 IMPLANT
DEVICE RAD COMP TR BAND LRG (VASCULAR PRODUCTS) ×2 IMPLANT
GLIDESHEATH SLEND SS 6F .021 (SHEATH) ×2 IMPLANT
GUIDEWIRE INQWIRE 1.5J.035X260 (WIRE) ×1 IMPLANT
INQWIRE 1.5J .035X260CM (WIRE) ×2
KIT ENCORE 26 ADVANTAGE (KITS) ×2 IMPLANT
KIT HEART LEFT (KITS) ×2 IMPLANT
PACK CARDIAC CATHETERIZATION (CUSTOM PROCEDURE TRAY) ×2 IMPLANT
SYR MEDRAD MARK 7 150ML (SYRINGE) ×2 IMPLANT
TRANSDUCER W/STOPCOCK (MISCELLANEOUS) ×2 IMPLANT
TUBING CIL FLEX 10 FLL-RA (TUBING) ×2 IMPLANT
WIRE COUGAR XT STRL 190CM (WIRE) ×2 IMPLANT

## 2018-09-04 NOTE — ED Notes (Signed)
ED TO INPATIENT HANDOFF REPORT  ED Nurse Name and Phone #: Suezanne Jacquet [phone]  S Name/Age/Gender Andrew [name] 68 y.o. male Room/Bed: 019C/019C  Code Status   Code Status: Prior  Home/SNF/Other Home Patient oriented to: self, place, time and situation Is this baseline? Yes   Triage Complete: Triage complete  Chief Complaint chest pain  Triage Note BIB [facility] with c/o of chest pain X 1 week with worsening tonight. Pt states it moved from left chest to mid and feels like "an elephant sitting on my chest." Hx of MI and states it feels the same. Received 3 nitro and 324mg  asp PTA.    Allergies Allergies  Allergen Reactions  . Tramadol Shortness Of Breath    And dizziness  . Trulicity [Dulaglutide] Swelling    Level of Care/Admitting Diagnosis ED Disposition    ED Disposition Condition [facility] Area: [place] [[id number]]  Level of Care: Progressive [102]  Diagnosis: Unstable angina [facility]) [[id number]]  Admitting Physician: Elwyn Reach [[id number]]  Attending Physician: Elwyn Reach [[id number]]  Estimated length of stay: past midnight tomorrow  Certification:: I certify this patient will need inpatient services for at least 2 midnights  PT Class (Do Not Modify): Inpatient [101]  PT Acc Code (Do Not Modify): Private [1]       B Medical/Surgery History Past Medical History:  Diagnosis Date  . Allergy   . Anemia   . Arthritis   . Cancer [facility])    Bladder  . Depression   . Diabetes mellitus without complication ([place])   . GERD (gastroesophageal reflux disease)   . Hyperlipidemia   . Hypertension    in the past no longer on medication  . Neuromuscular disorder ([place])   . Sleep apnea    BiPAP no used in 3 years  . Status post tendon repair 1989  . STEMI (ST elevation myocardial infarction) ([place]) 01/20/2018   Cardiac cath January 21, 2018: Severe multivessel disease with unclear lesion, but opted for PCI/DES x1 to the RI.  Appeared  to have CTO of the distal LAD and circumflex as well as moderate mid LAD and diagonal disease as well as PDA.Marland Kitchen  Unable to cross circumflex lesion.  . Stroke ([place])    At times pt has dizziness with loss of vision  . Tremors of nervous system 2011   Past Surgical History:  Procedure Laterality Date  . APPENDECTOMY    . BLADDER SURGERY    . COLONOSCOPY    . CORONARY STENT INTERVENTION N/A 01/21/2018   Procedure: CORONARY STENT INTERVENTION;  Surgeon: Leonie Man, MD;  Location: [place] [facility];  Service: Cardiovascular;  Laterality: N/A;  95% Ramus Intermedius -PCI with synergy DES 2.25 mm x 12 mm postdilated 2.4 mm.  Remus Blake ACUTE MI REVASCULARIZATION N/A 01/21/2018   Procedure: Coronary/Graft Acute MI Revascularization;  Surgeon: Leonie Man, MD;  Location: [place] [facility];  Service: Cardiovascular;  Laterality: N/A;  attempted revas to distal CFX;   . CYSTOSCOPY W/ RETROGRADES Bilateral 08/22/2015   Procedure: CYSTOSCOPY WITH RETROGRADE PYELOGRAM;  Surgeon: Irine Seal, MD;  Location: [facility];  Service: Urology;  Laterality: Bilateral;  . CYSTOSCOPY W/ RETROGRADES Bilateral 01/16/2016   Procedure: CYSTOSCOPY WITH RETROGRADE PYELOGRAM;  Surgeon: Irine Seal, MD;  Location: [facility];  Service: Urology;  Laterality: Bilateral;  . CYSTOSCOPY W/ RETROGRADES Bilateral 01/07/2017   Procedure: CYSTOSCOPY WITH RETROGRADE PYELOGRAM;  Surgeon: Irine Seal, MD;  Location: [facility];  Service: Urology;  Laterality: Bilateral;  . CYSTOSCOPY WITH BIOPSY N/A 08/22/2015   Procedure: CYSTOSCOPY WITH BLADDER BIOPSY;  Surgeon: Irine Seal, MD;  Location: [facility];  Service: Urology;  Laterality: N/A;  . CYSTOSCOPY WITH BIOPSY N/A 01/16/2016   Procedure: CYSTOSCOPY WITH BLADDER BIOPSY;  Surgeon: Irine Seal, MD;  Location: [facility];  Service: Urology;  Laterality: N/A;  . CYSTOSCOPY WITH FULGERATION N/A 01/16/2016   Procedure: CYSTOSCOPY WITH FULGERATION;  Surgeon: Irine Seal, MD;  Location: [facility];  Service:  Urology;  Laterality: N/A;  . KNEE ARTHROSCOPY Right 1989  . LEFT HEART CATH AND CORONARY ANGIOGRAPHY N/A 01/21/2018   Procedure: LEFT HEART CATH AND CORONARY ANGIOGRAPHY;  Surgeon: Leonie Man, MD;  Location: [place] [facility];  Service: Cardiovascular;; 95% RI-DES PCI.  80% o-pCX (PTCA) -> dCX 80%-100% CTO (unsuccessful PTCA unable to cross distal lesion with balloon.)  100% CTO dLAD. RPDA 80% and 70% as well as P AV 180%, PA V2 60%.  (too small for PCI)  . ROTATOR CUFF REPAIR Bilateral 2000  . rt. leg fracture surgery    . TRANSTHORACIC ECHOCARDIOGRAM  01/21/2018   Mild LVH.  EF 50 and 55%.  Apical inferior hypokinesis.  Mid-apical anterolateral hypokinesis.  Normal diastolic function for age   . TRANSURETHRAL RESECTION OF BLADDER TUMOR N/A 01/07/2017   Procedure: TRANSURETHRAL RESECTION OF BLADDER TUMOR (TURBT);  Surgeon: Irine Seal, MD;  Location: [facility];  Service: Urology;  Laterality: N/A;  . WISDOM TOOTH EXTRACTION       A IV Location/Drains/Wounds Patient Lines/Drains/Airways Status   Active Line/Drains/Airways    Name:   Placement date:   Placement time:   Site:   Days:   Peripheral IV 09/03/18 Left Forearm   09/03/18    2151    Forearm   1   Peripheral IV 09/04/18 Right;Upper Forearm   09/04/18    0034    Forearm   less than 1          Intake/Output Last 24 hours No intake or output data in the 24 hours ending 09/04/18 0045  Labs/Imaging Results for orders placed or performed during the hospital encounter of 09/03/18 (from the past 48 hour(s))  Urinalysis, Routine w reflex microscopic     Status: Abnormal   Collection Time: 09/03/18 12:00 AM  Result Value Ref Range   Color, Urine YELLOW YELLOW   APPearance CLEAR CLEAR   Specific Gravity, Urine 1.028 1.005 - 1.030   pH 5.0 5.0 - 8.0   Glucose, UA >=500 (A) NEGATIVE mg/dL   Hgb urine dipstick SMALL (A) NEGATIVE   Bilirubin Urine NEGATIVE NEGATIVE   Ketones, ur NEGATIVE NEGATIVE mg/dL   Protein, ur NEGATIVE  NEGATIVE mg/dL   Nitrite NEGATIVE NEGATIVE   Leukocytes,Ua NEGATIVE NEGATIVE   RBC / HPF 0-5 0 - 5 RBC/hpf   WBC, UA 0-5 0 - 5 WBC/hpf   Bacteria, UA NONE SEEN NONE SEEN    Comment: Performed at [facility], [address], [place], [place] [address]  Basic metabolic panel     Status: Abnormal   Collection Time: 09/03/18  9:54 PM  Result Value Ref Range   Sodium 135 135 - 145 mmol/L   Potassium 3.8 3.5 - 5.1 mmol/L   Chloride 103 98 - 111 mmol/L   CO2 21 (L) 22 - 32 mmol/L   Glucose, Bld 151 (H) 70 - 99 mg/dL   BUN 18 8 - 23 mg/dL   Creatinine, Ser 0.96  0.61 - 1.24 mg/dL   Calcium 9.1 8.9 - 10.3 mg/dL   GFR calc non Af Amer >60 >60 mL/min   GFR calc Af Amer >60 >60 mL/min   Anion gap 11 5 - 15    Comment: Performed at [facility] [address] [place], [place] [address]  CBC     Status: Abnormal   Collection Time: 09/03/18  9:54 PM  Result Value Ref Range   WBC 13.1 (H) 4.0 - 10.5 K/uL   RBC 4.80 4.22 - 5.81 MIL/uL   Hemoglobin 15.0 13.0 - 17.0 g/dL   HCT 44.7 39.0 - 52.0 %   MCV 93.1 80.0 - 100.0 fL   MCH 31.3 26.0 - 34.0 pg   MCHC 33.6 30.0 - 36.0 g/dL   RDW 12.3 11.5 - 15.5 %   Platelets 243 150 - 400 K/uL   nRBC 0.0 0.0 - 0.2 %    Comment: Performed at [facility], [place] [address], [place], [place] [address]  Troponin I - ONCE - STAT     Status: None   Collection Time: 09/03/18  9:54 PM  Result Value Ref Range   Troponin I <0.03 <0.03 ng/mL    Comment: Performed at [facility], [place] [address] [place], [place] [address]  Hepatic function panel     Status: Abnormal   Collection Time: 09/03/18  9:54 PM  Result Value Ref Range   Total Protein 7.1 6.5 - 8.1 g/dL   Albumin 3.6 3.5 - 5.0 g/dL   AST 14 (L) 15 - 41 U/L   ALT 13 0 - 44 U/L   Alkaline Phosphatase 53 38 - 126 U/L   Total Bilirubin 0.7 0.3 - 1.2 mg/dL   Bilirubin, Direct 0.1 0.0 - 0.2 mg/dL   Indirect Bilirubin 0.6 0.3 - 0.9 mg/dL    Comment: Performed at [facility] [address], [place], [place] [address]  Lipase, blood     Status: None   Collection Time: 09/03/18  9:54 PM  Result Value Ref Range   Lipase 29 11 - 51 U/L    Comment: Performed at [facility] [address] [place], [place] [address]   Dg Chest 2 View  Result Date: 09/03/2018 CLINICAL DATA:  68 y/o  M; 1 week of chest pain worsening tonight. EXAM: CHEST - 2 VIEW COMPARISON:  01/20/2018 chest radiograph FINDINGS: Stable cardiac silhouette within normal limits given projection and technique. Aortic atherosclerosis with calcification. Clear lungs. No pleural effusion or pneumothorax. No acute osseous abnormality is evident. Partially visualized rotator cuff surgical repair bilaterally. IMPRESSION: No acute pulmonary process identified. Stable cardiac silhouette. Aortic Atherosclerosis (ICD10-I70.0). Electronically Signed   By: Kristine Garbe M.D.   On: 09/03/2018 22:27   Ct Angio Chest/abd/pel For Dissection W And/or Wo Contrast  Result Date: 09/04/2018 CLINICAL DATA:  Chest pain for 1 week EXAM: CT ANGIOGRAPHY CHEST, ABDOMEN AND PELVIS TECHNIQUE: Multidetector CT imaging through the chest, abdomen and pelvis was performed using the standard protocol during bolus administration of intravenous contrast. Multiplanar reconstructed images and MIPs were obtained and reviewed to evaluate the vascular anatomy. CONTRAST:  136mL OMNIPAQUE 350 COMPARISON:  Chest x-ray from earlier in the same day. FINDINGS: CTA CHEST FINDINGS Cardiovascular: Thoracic aorta demonstrates mild atherosclerotic calcifications without evidence of aneurysmal dilatation or dissection. No cardiac enlargement is seen. Mild coronary calcifications are noted. Pulmonary artery is well visualized without evidence of pulmonary embolism. Minimal pericardial effusion  is noted. This is new from the prior exam. Mediastinum/Nodes: Thoracic inlet is within normal limits. Scattered mediastinal lymph nodes are noted similar to that  seen on prior CT. No significant adenopathy is noted. Multiple calcified hilar nodes are noted consistent with prior granulomatous disease. The esophagus is within normal limits. Lungs/Pleura: Mild emphysematous changes are noted. Mild bibasilar atelectatic changes are noted. Calcified pleural plaques are seen. No focal confluent infiltrate is seen. Musculoskeletal: Degenerative changes of the thoracic spine are seen. No acute rib abnormality is noted. Review of the MIP images confirms the above findings. CTA ABDOMEN AND PELVIS FINDINGS VASCULAR Aorta: The abdominal aorta is within normal limits. Normal branching pattern is noted. Mild aortic calcifications are seen without aneurysmal dilatation. Celiac: Patent without evidence of aneurysm, dissection, vasculitis or significant stenosis. SMA: Patent without evidence of aneurysm, dissection, vasculitis or significant stenosis. Renals: Dual renal arteries are noted on the left. Single renal artery on the right. No focal stenosis is noted. IMA: Patent without evidence of aneurysm, dissection, vasculitis or significant stenosis. Iliacs: Mild atherosclerotic changes are noted without aneurysmal dilatation. Veins: No venous abnormality is noted. Review of the MIP images confirms the above findings. NON-VASCULAR Hepatobiliary: No focal liver abnormality is seen. No gallstones, gallbladder wall thickening, or biliary dilatation. Pancreas: Unremarkable. No pancreatic ductal dilatation or surrounding inflammatory changes. Spleen: Normal in size without focal abnormality. Adrenals/Urinary Tract: Adrenal glands are within normal limits. Kidneys are well visualized bilaterally without renal calculi or obstructive changes. The bladder is decompressed. Stomach/Bowel: Scattered diverticular change of the colon is noted without diverticulitis. No obstructive or inflammatory changes are seen. The appendix has been surgically removed. No gastric abnormality is seen. Lymphatic: No  significant lymphadenopathy is noted. Reproductive: Prostate is unremarkable. Other: No abdominal wall hernia or abnormality. No abdominopelvic ascites. Musculoskeletal: Degenerative changes of the lumbar spine are noted. Review of the MIP images confirms the above findings. IMPRESSION: Atherosclerotic changes of the aorta without aneurysmal dilatation or dissection. Emphysematous changes and prior granulomatous changes. Diverticulosis without diverticulitis. Small pericardial effusion No other focal abnormality is noted. Aortic Atherosclerosis (ICD10-I70.0) and Emphysema (ICD10-J43.9). Electronically Signed   By: Inez Catalina M.D.   On: 09/04/2018 00:35    Pending Labs [employer] (From admission, onward)    Start     Ordered   Signed and Held  CBC  (enoxaparin (LOVENOX) full dose)  Once,   R    Comments:  Baseline for enoxaparin therapy IF NOT ALREADY DRAWN.  Notify MD if PLT < 100 K.    Signed and Held   Signed and Held  Creatinine, serum  (enoxaparin (LOVENOX) full dose)  Once,   R    Comments:  Baseline for enoxaparin therapy IF NOT ALREADY DRAWN.    Signed and Held   Signed and Held  Creatinine, serum  (enoxaparin (LOVENOX) full dose)  Weekly,   R    Comments:  while on enoxaparin therapy.    Signed and Held          Vitals/Pain Today's Vitals   09/04/18 0001 09/04/18 0018 09/04/18 0019 09/04/18 0030  BP: 122/77 98/68  105/65  Pulse: 86  89 89  Resp: (!) 29  (!) 22 (!) 22  Temp:      TempSrc:      SpO2: 94%  95% 95%  Weight:      Height:      PainSc:        Isolation Precautions No active isolations  Medications Medications  morphine 4 MG/ML injection 6 mg (6 mg Intravenous Given 09/03/18 2223)  ondansetron (ZOFRAN) injection 4 mg (4 mg Intravenous Given 09/03/18 2223)  morphine 4 MG/ML injection 4 mg (4 mg Intravenous Given 09/03/18 2252)  sodium chloride 0.9 % bolus 500 mL (0 mLs Intravenous Stopped 09/04/18 0045)  iohexol (OMNIPAQUE) 350 MG/ML injection 100 mL  (100 mLs Intravenous Contrast Given 09/04/18 0017)    Mobility walks Low fall risk   Focused Assessments Cardiac Assessment Handoff:  Cardiac Rhythm: Normal sinus rhythm Lab Results  Component Value Date   TROPONINI <0.03 09/03/2018   Lab Results  Component Value Date   DDIMER 2.08 (H) 01/20/2018   Does the Patient currently have chest pain? Yes     R Recommendations: See Admitting Provider Note  Report given to:   Additional Notes: N/A

## 2018-09-04 NOTE — Care Management (Signed)
[employer] benefits check sent and pending.  Haris Baack RN, BSN, NCM-BC, ACM-RN [phone] 

## 2018-09-04 NOTE — ED Provider Notes (Signed)
Physical Exam  BP 111/71 (BP Location: Right Arm)   Pulse 89   Temp 99.6 F (37.6 C) (Oral)   Resp 18   Ht 6\' 3"  (1.905 m)   Wt 93 kg   SpO2 95%   BMI 25.62 kg/m   Physical Exam  ED Course/Procedures   Clinical Course as of Sep 04 822  Mon Sep 04, 2018  0004  I personally evaluated the patient.  He is hypotensive in severe pain, clutching his chest.  Pt discussed with Dr. Betsey Holiday.  RN to CT with patient.     [HM]  0045 The patient was discussed with and evaluated by Dr. Betsey Holiday who agrees with the treatment plan.    [HM]  0144 Small pericardial effusion, but no evidence of PE or dissection on CTA.  Pt continues to have pain.  Dr. Jonelle Sidle changed bed request to stepdown.    CT Angio Chest/Abd/Pel for Dissection W and/or Wo Contrast [HM]    Clinical Course User Index [HM] Muthersbaugh, Gwenlyn Perking    Procedures  MDM    EKG Interpretation  Date/Time:  Monday September 04 2018 08:07:37 EDT Ventricular Rate:  89 PR Interval:    QRS Duration: 73 QT Interval:  358 QTC Calculation: 436 R Axis:   46 Text Interpretation:  Sinus rhythm Abnormal R-wave progression, early transition Inferior infarct, acute (LCx) Lateral leads are also involved NEW EKG CHANGES NOTED INFERIOR ST ELEVATION Confirmed by Varney Biles [phone]) on 09/04/2018 8:23:48 AM       Patient's EKG is appearing concerning for a STEMI.  I reassessed the patient.  He reports that he came into the ER because of chest pain.  His chest pain actually has been going on for a few days, yesterday it shifted towards the center from the left side, and became more intense therefore he came to the ER.  So far patient has had a cardiology evaluation along with EKG, serial troponins and CT chest abdomen and pelvis.  The EKG was not diagnostic for STEMI he first arrived.  The troponins have been normal and patient had a negative CT angios chest abdomen and pelvis.  Patient's pain is pleuritic in nature.  He does indicate  having some shortness of breath and cough, however the shortness of breath is because of his pleuritic chest pain.  Given the negative CT angios chest, it is highly unlikely that patient has a large PE.  He might have a small PE, but that should not cause dramatic EKG changes.  Patient is already appropriately anticoagulated.  Code STEMI was activated within a couple of minutes of EKG handed to me.  Repeat EKG was completed 10 minutes later and it continues to remain concerning.  Cardiology team will take the patient to Cath Lab.  Ruvim Risko was evaluated in Emergency Department on 09/04/2018 for the symptoms described in the history of present illness. He was evaluated in the context of the global COVID-19 pandemic, which necessitated consideration that the patient might be at risk for infection with the SARS-CoV-2 virus that causes COVID-19. Institutional protocols and algorithms that pertain to the evaluation of patients at risk for COVID-19 are in a state of rapid change based on information released by regulatory bodies including the CDC and federal and state organizations. These policies and algorithms were followed during the patient's care in the ED.   CRITICAL CARE Performed by: Korbyn Vanes   Total critical care time: 31 minutes  Critical care time was exclusive of separately  billable procedures and treating other patients.  Critical care was necessary to treat or prevent imminent or life-threatening deterioration.  Critical care was time spent personally by me on the following activities: development of treatment plan with patient and/or surrogate as well as nursing, discussions with consultants, evaluation of patient's response to treatment, examination of patient, obtaining history from patient or surrogate, ordering and performing treatments and interventions, ordering and review of laboratory studies, ordering and review of radiographic studies, pulse oximetry and  re-evaluation of patient's condition.         Varney Biles, MD 09/05/18 [phone]

## 2018-09-04 NOTE — Progress Notes (Signed)
  Echocardiogram 2D Echocardiogram has been performed.  Jennette Dubin 09/04/2018, 11:45 AM

## 2018-09-04 NOTE — H&P (Addendum)
History and Physical   Andrew [name] DOB: 10/14/1950 DOA: 09/03/2018  Referring MD/NP/PA: Dr. Ralene [name]  PCP: Dettinger, Fransisca Kaufmann, MD   Outpatient Specialists: Dr. Ellyn [name] cardiology  Patient coming from: Home  Chief Complaint: Chest pain  HPI: Andrew [name] is a 68 y.o. male with medical history significant of coronary artery disease status post coronary artery stenting with multivessel disease, peripheral arterial disease, diabetes, hypertension, hyperlipidemia and GERD who was seen in the ER today due to chest pain.  Patient has chest pain rated as 9 out of 10 in his anterior chest and upper abdomen.  Associated with diaphoresis.  Also very anxious.  Patient had normal troponin as well as EKG.  Worrisome for acute coronary syndrome or unstable angina.  Cardiology was consulted and medicine asked to admit patient for rule out MI.  Patient had initial evaluation by cardiology who suspects noncardiac causes including GI causes.  At this point patient is being evaluated for further causes involving the abdomen and chest.  He is getting CT angiogram.  We will proceed to admit for rule out MI and unstable angina..  ED Course: Temperature 99.6 blood pressure 98/69, pulse 93, respiratory 29 and oxygen sats 91% room air.  White count is 13.1 hemoglobin 15.1 platelets 243.  CO2 21 otherwise chemistry appears to be within normal.  Initial troponin is negative.  Chest x-ray is negative.  EKG showed no new changes.  Patient will be admitted for and follow-up other test results.  Review of Systems: As per HPI otherwise 10 point review of systems negative.    Past Medical History:  Diagnosis Date   Allergy    Anemia    Arthritis    Cancer ([place])    Bladder   Depression    Diabetes mellitus without complication (HCC)    GERD (gastroesophageal reflux disease)    Hyperlipidemia    Hypertension    in the past no longer on medication   Neuromuscular disorder ([place])    Sleep apnea      BiPAP no used in 3 years   Status post tendon repair 1989   STEMI (ST elevation myocardial infarction) ([place]) 01/20/2018   Cardiac cath January 21, 2018: Severe multivessel disease with unclear lesion, but opted for PCI/DES x1 to the RI.  Appeared to have CTO of the distal LAD and circumflex as well as moderate mid LAD and diagonal disease as well as PDA.Marland Kitchen  Unable to cross circumflex lesion.   Stroke [facility])    At times pt has dizziness with loss of vision   Tremors of nervous system 2011    Past Surgical History:  Procedure Laterality Date   APPENDECTOMY     BLADDER SURGERY     COLONOSCOPY     CORONARY STENT INTERVENTION N/A 01/21/2018   Procedure: CORONARY STENT INTERVENTION;  Surgeon: Andrew Man, MD;  Location: [facility];  Service: Cardiovascular;  Laterality: N/A;  95% Ramus Intermedius -PCI with synergy DES 2.25 mm x 12 mm postdilated 2.4 mm.   CORONARY/GRAFT ACUTE MI REVASCULARIZATION N/A 01/21/2018   Procedure: Coronary/Graft Acute MI Revascularization;  Surgeon: Andrew Man, MD;  Location: [facility];  Service: Cardiovascular;  Laterality: N/A;  attempted revas to distal CFX;    CYSTOSCOPY W/ RETROGRADES Bilateral 08/22/2015   Procedure: CYSTOSCOPY WITH RETROGRADE PYELOGRAM;  Surgeon: Andrew Seal, MD;  Location: [facility];  Service: Urology;  Laterality: Bilateral;   CYSTOSCOPY W/ RETROGRADES Bilateral 01/16/2016   Procedure: CYSTOSCOPY WITH RETROGRADE  PYELOGRAM;  Surgeon: Andrew Seal, MD;  Location: [facility];  Service: Urology;  Laterality: Bilateral;   CYSTOSCOPY W/ RETROGRADES Bilateral 01/07/2017   Procedure: CYSTOSCOPY WITH RETROGRADE PYELOGRAM;  Surgeon: Andrew Seal, MD;  Location: [facility];  Service: Urology;  Laterality: Bilateral;   CYSTOSCOPY WITH BIOPSY N/A 08/22/2015   Procedure: CYSTOSCOPY WITH BLADDER BIOPSY;  Surgeon: Andrew Seal, MD;  Location: [facility];  Service: Urology;  Laterality: N/A;   CYSTOSCOPY WITH BIOPSY N/A 01/16/2016   Procedure:  CYSTOSCOPY WITH BLADDER BIOPSY;  Surgeon: Andrew Seal, MD;  Location: [facility];  Service: Urology;  Laterality: N/A;   CYSTOSCOPY WITH FULGERATION N/A 01/16/2016   Procedure: CYSTOSCOPY WITH FULGERATION;  Surgeon: Andrew Seal, MD;  Location: [facility];  Service: Urology;  Laterality: N/A;   KNEE ARTHROSCOPY Right 1989   LEFT HEART CATH AND CORONARY ANGIOGRAPHY N/A 01/21/2018   Procedure: LEFT HEART CATH AND CORONARY ANGIOGRAPHY;  Surgeon: Andrew Man, MD;  Location: [facility];  Service: Cardiovascular;; 95% RI-DES PCI.  80% o-pCX (PTCA) -> dCX 80%-100% CTO (unsuccessful PTCA unable to cross distal lesion with balloon.)  100% CTO dLAD. RPDA 80% and 70% as well as P AV 180%, PA V2 60%.  (too small for PCI)   ROTATOR CUFF REPAIR Bilateral 2000   rt. leg fracture surgery     TRANSTHORACIC ECHOCARDIOGRAM  01/21/2018   Mild LVH.  EF 50 and 55%.  Apical inferior hypokinesis.  Mid-apical anterolateral hypokinesis.  Normal diastolic function for age    TRANSURETHRAL RESECTION OF BLADDER TUMOR N/A 01/07/2017   Procedure: TRANSURETHRAL RESECTION OF BLADDER TUMOR (TURBT);  Surgeon: Andrew Seal, MD;  Location: [facility];  Service: Urology;  Laterality: N/A;   WISDOM TOOTH EXTRACTION       reports that he has been smoking cigarettes. He has a 21.00 pack-year smoking history. He has never used smokeless tobacco. He reports that he does not drink alcohol or use drugs.  Allergies  Allergen Reactions   Tramadol Shortness Of Breath    And dizziness   Trulicity [Dulaglutide] Swelling    Family History  Problem Relation Age of Onset   Diabetes Mother 72       type 1   Stroke Mother    Dementia Father    Diabetes Brother    Heart attack Brother 69   Testicular cancer Brother    Diabetes Brother    Cancer Brother        testicular   Colon cancer Neg Hx    Colon polyps Neg Hx    Esophageal cancer Neg Hx    Rectal cancer Neg Hx    Stomach cancer Neg Hx      Prior to  Admission medications   Medication Sig Start Date End Date Taking? Authorizing Provider  aspirin 81 MG tablet Take 81 mg by mouth daily.    [provider]  bisoprolol (ZEBETA) 5 MG tablet Take 0.5 tablets (2.5 mg total) by mouth daily. Take at 2pm 05/15/18   Eustaquio Maize, MD  budesonide-formoterol [facility]) 80-4.5 MCG/ACT inhaler Inhale 2 puffs into the lungs 2 (two) times daily. Patient not taking: Reported on 07/24/2018 02/24/18   Eustaquio Maize, MD  empagliflozin (JARDIANCE) 25 MG TABS tablet Take 25 mg by mouth daily. 02/22/18   Eustaquio Maize, MD  fenofibrate (TRICOR) 48 MG tablet Take 1 tablet (48 mg total) by mouth daily. 08/08/18   [facility], Tami Lin, PA  HYDROcodone-acetaminophen (NORCO/VICODIN) 5-325 MG tablet Take 1 tablet  by mouth 2 (two) times daily as needed for moderate pain. 07/10/18   Dettinger, Fransisca Kaufmann, MD  HYDROcodone-acetaminophen (NORCO/VICODIN) 5-325 MG tablet Take 1 tablet by mouth 2 (two) times daily as needed for moderate pain. Do not refill until 30 days from prescription date 07/10/18   Dettinger, Fransisca Kaufmann, MD  HYDROcodone-acetaminophen (NORCO/VICODIN) 5-325 MG tablet Take 1 tablet by mouth 2 (two) times daily as needed for moderate pain. Do not refill until 60 days from prescription date 07/10/18   Dettinger, Fransisca Kaufmann, MD  isosorbide mononitrate (IMDUR) 30 MG 24 hr tablet Take 0.5 tablets (15 mg total) by mouth daily. 07/10/18 08/24/18  Dettinger, Fransisca Kaufmann, MD  lisinopril (PRINIVIL,ZESTRIL) 5 MG tablet Take 1 tablet (5 mg total) by mouth daily. 04/10/18   Eustaquio Maize, MD  metFORMIN (GLUCOPHAGE) 1000 MG tablet Take 1 tablet by mouth 2 times daily with a meal. 07/10/18   Dettinger, Fransisca Kaufmann, MD  nitroGLYCERIN (NITROSTAT) 0.4 MG SL tablet Place 1 tablet (0.4 mg total) under the tongue every 5 (five) minutes as needed for chest pain. Patient not taking: Reported on 08/02/2018 07/10/18   Dettinger, Fransisca Kaufmann, MD  omeprazole (PRILOSEC) 20 MG capsule TAKE (1) CAPSULE  DAILY 08/09/18   Dettinger, Fransisca Kaufmann, MD  primidone (MYSOLINE) 50 MG tablet Take 1 tablet (50 mg total) by mouth 4 (four) times daily. 01/06/18   Eustaquio Maize, MD  rosuvastatin (CRESTOR) 40 MG tablet Take 1 tablet (40 mg total) by mouth daily. 08/01/18 10/30/18  Andrew Man, MD  sitaGLIPtin (JANUVIA) 100 MG tablet Take 1 tablet (100 mg total) by mouth daily. 01/06/18   Eustaquio Maize, MD  tamsulosin (FLOMAX) 0.4 MG CAPS capsule Take 1 capsule (0.4 mg total) by mouth daily. 04/10/18   Eustaquio Maize, MD  ticagrelor (BRILINTA) 90 MG TABS tablet Take 1 tablet (90 mg total) by mouth 2 (two) times daily. 08/02/18   Andrew Man, MD    Physical Exam: Vitals:   09/03/18 2315 09/03/18 2330 09/03/18 2353 09/04/18 0001  BP: 106/61 107/66 98/69 122/77  Pulse: 87 88 88 86  Resp: (!) 24 (!) 23 (!) 23 (!) 29  Temp:      TempSrc:      SpO2: 92% 93% 94% 94%  Weight:      Height:          Constitutional: NAD, anxious and diaphoretic Vitals:   09/03/18 2315 09/03/18 2330 09/03/18 2353 09/04/18 0001  BP: 106/61 107/66 98/69 122/77  Pulse: 87 88 88 86  Resp: (!) 24 (!) 23 (!) 23 (!) 29  Temp:      TempSrc:      SpO2: 92% 93% 94% 94%  Weight:      Height:       Eyes: PERRL, lids and conjunctivae normal ENMT: Mucous membranes are moist. Posterior pharynx clear of any exudate or lesions.Normal dentition.  Neck: normal, supple, no masses, no thyromegaly Respiratory: clear to auscultation bilaterally, no wheezing, no crackles. Normal respiratory effort. No accessory muscle use.  Cardiovascular: Tachycardic,, no murmurs / rubs / gallops. No extremity edema. 2+ pedal pulses. No carotid bruits.  Abdomen: Mild diffuse tenderness, no masses palpated. No hepatosplenomegaly. Bowel sounds positive.  Musculoskeletal: no clubbing / cyanosis. No joint deformity upper and lower extremities. Good ROM, no contractures. Normal muscle tone.  Skin: no rashes, lesions, ulcers. No induration Neurologic: CN  2-12 grossly intact. Sensation intact, DTR normal. Strength 5/5 in all 4.  Psychiatric: Normal judgment  and insight. Alert and oriented x 3. Normal mood.     Labs on Admission: I have personally reviewed following labs and imaging studies  CBC: Recent Labs  Lab 09/03/18 2154  WBC 13.1*  HGB 15.0  HCT 44.7  MCV 93.1  PLT 644   Basic Metabolic Panel: Recent Labs  Lab 09/03/18 2154  NA 135  K 3.8  CL 103  CO2 21*  GLUCOSE 151*  BUN 18  CREATININE 0.96  CALCIUM 9.1   GFR: Estimated Creatinine Clearance: 88 mL/min (by C-G formula based on SCr of 0.96 mg/dL). Liver Function Tests: Recent Labs  Lab 09/03/18 2154  AST 14*  ALT 13  ALKPHOS 53  BILITOT 0.7  PROT 7.1  ALBUMIN 3.6   Recent Labs  Lab 09/03/18 2154  LIPASE 29   No results for input(s): AMMONIA in the last 168 hours. Coagulation Profile: No results for input(s): INR, PROTIME in the last 168 hours. Cardiac Enzymes: Recent Labs  Lab 09/03/18 2154  TROPONINI <0.03   BNP (last 3 results) No results for input(s): PROBNP in the last 8760 hours. HbA1C: No results for input(s): HGBA1C in the last 72 hours. CBG: No results for input(s): GLUCAP in the last 168 hours. Lipid Profile: No results for input(s): CHOL, HDL, LDLCALC, TRIG, CHOLHDL, LDLDIRECT in the last 72 hours. Thyroid Function Tests: No results for input(s): TSH, T4TOTAL, FREET4, T3FREE, THYROIDAB in the last 72 hours. Anemia Panel: No results for input(s): VITAMINB12, FOLATE, FERRITIN, TIBC, IRON, RETICCTPCT in the last 72 hours. Urine analysis:    Component Value Date/Time   COLORURINE YELLOW 09/03/2018 0000   APPEARANCEUR CLEAR 09/03/2018 0000   APPEARANCEUR Clear 01/15/2016 0902   LABSPEC 1.028 09/03/2018 0000   PHURINE 5.0 09/03/2018 0000   GLUCOSEU >=500 (A) 09/03/2018 0000   HGBUR SMALL (A) 09/03/2018 0000   BILIRUBINUR NEGATIVE 09/03/2018 0000   BILIRUBINUR Negative 01/15/2016 0902   KETONESUR NEGATIVE 09/03/2018 0000    PROTEINUR NEGATIVE 09/03/2018 0000   NITRITE NEGATIVE 09/03/2018 0000   LEUKOCYTESUR NEGATIVE 09/03/2018 0000   Sepsis Labs: @LABRCNTIP (procalcitonin:4,lacticidven:4) )No results found for this or any previous visit (from the past 240 hour(s)).   Radiological Exams on Admission: Dg Chest 2 View  Result Date: 09/03/2018 CLINICAL DATA:  68 y/o  M; 1 week of chest pain worsening tonight. EXAM: CHEST - 2 VIEW COMPARISON:  01/20/2018 chest radiograph FINDINGS: Stable cardiac silhouette within normal limits given projection and technique. Aortic atherosclerosis with calcification. Clear lungs. No pleural effusion or pneumothorax. No acute osseous abnormality is evident. Partially visualized rotator cuff surgical repair bilaterally. IMPRESSION: No acute pulmonary process identified. Stable cardiac silhouette. Aortic Atherosclerosis (ICD10-I70.0). Electronically Signed   By: Kristine Garbe M.D.   On: 09/03/2018 22:27    EKG: Independently reviewed.  It shows normal sinus rhythm with a rate of 91, poor R wave progression otherwise no significant ST changes.  Assessment/Plan Principal Problem:   Chest pain Active Problems:   Tobacco use disorder   Type 2 diabetes mellitus (HCC)   Hyperlipidemia associated with type 2 diabetes mellitus ([place])   Essential hypertension   Coronary artery disease involving native coronary artery of native heart with angina pectoris ([place])     #1 chest pain: Symptoms are atypical for coronary artery disease.  With patient's risk factors however will consider cardiac rule out.  Will follow results of CT angiogram of the chest and abdomen for possible other etiologies.  Patient continues to have ongoing chest pain not relieved by  initial treatment in the ER.  He will be transferred to progressive care.  Initiate nitroglycerin drip if blood pressure will allow.  Cardiology following.  #2 type 2 diabetes: Initiate sliding scale insulin with home regiment.  #3  hyperlipidemia: Continue with statin.  #4 essential hypertension: Slightly hypotensive.  Hold blood pressure medications until patient is stable and blood pressure rebounds.  #5 tobacco abuse: Add nicotine patch.  #6 coronary artery disease: Continue with work-up for cardiac causes.   DVT prophylaxis: Lovenox Code Status: Full code Family Communication: No family at bedside Disposition Plan: Home Consults called: Dr. Hassell Done of cardiology Admission status: Observation  Severity of Illness: The appropriate patient status for this patient is OBSERVATION. Observation status is judged to be reasonable and necessary in order to provide the required intensity of service to ensure the patient's safety. The patient's presenting symptoms, physical exam findings, and initial radiographic and laboratory data in the context of their medical condition is felt to place them at decreased risk for further clinical deterioration. Furthermore, it is anticipated that the patient will be medically stable for discharge from the hospital within 2 midnights of admission. The following factors support the patient status of observation.   " The patient's presenting symptoms include chest pain. " The physical exam findings include tachycardia with diaphoresis. " The initial radiographic and laboratory data are normal chest pain.     Barbette Merino MD [facility] Pager [phone]  If 7PM-7AM, please contact night-coverage [email or web address] Password [facility]  09/04/2018, 12:21 AM

## 2018-09-04 NOTE — Progress Notes (Signed)
  CCU PM Rounds:  Cath films reviewed personally. Chest pain improved (but not completely resolved) s/p PCI but also received pain meds. Says he can rest now.   Case reviewed with Dr. Burt Knack. Suspect a component of pericarditis.   No rub on exam. No other symptoms suggestive of COVID infection.   Echo today EF 40-45%. No significant effusion.   Check ESR and CRP.  Will start colchicine 600 bid.    Glori Bickers, MD  3:19 PM

## 2018-09-04 NOTE — Progress Notes (Signed)
Progress Note  Patient Name: Andrew [name] Date of Encounter: 09/04/2018  Primary Cardiologist: Glenetta Hew, MD   Subjective   Called to see patient urgently because of acute EKG changes.  The patient presented last night with ongoing chest discomfort with both a substernal pressure and a pleuritic left-sided component.  He has known coronary disease.  His pain persisted through the night and this morning's EKG demonstrates acute inferior ST elevation consistent with an acute inferior STEMI.  At the time of my evaluation this morning, the patient continues to have chest discomfort that he describes as a heavy pressure across the center of his chest.  States it feels like someone is "sitting on my chest."  He denies any change in his breathing.  He denies nausea or vomiting.  He has been compliant with Brilinta at home.  He discontinued aspirin a few days ago.  States he occasionally misses his medications but for the most part is compliant especially with his Brilinta.  Inpatient Medications    Scheduled Meds:  enoxaparin (LOVENOX) injection  90 mg Subcutaneous Q12H   insulin aspart  0-5 Units Subcutaneous QHS   insulin aspart  0-9 Units Subcutaneous TID WC   ticagrelor  90 mg Oral Once   Continuous Infusions:  PRN Meds: acetaminophen, ALPRAZolam, ondansetron (ZOFRAN) IV   Vital Signs    Vitals:   09/04/18 0600 09/04/18 0700 09/04/18 0730 09/04/18 0803  BP: 97/78 106/79 116/71 111/71  Pulse: 87 86 86 89  Resp: 18 (!) 23 14 18   Temp:      TempSrc:      SpO2: 95% 96% 96% 95%  Weight:      Height:       No intake or output data in the 24 hours ending 09/04/18 0836 Last 3 Weights 09/03/2018 08/02/2018 07/31/2018  Weight (lbs) 205 lb 202 lb 9.6 oz 201 lb  Weight (kg) 92.987 kg 91.899 kg 91.173 kg      Telemetry    Sinus bradycardia with PVCs and PACs - Personally Reviewed  ECG    Sinus rhythm with acute inferolateral STEMI pattern- Personally Reviewed  Physical Exam   Alert, oriented male in moderate distress secondary to chest pain GEN: No acute distress.   Neck: No JVD Cardiac: RRR, no murmurs, rubs, or gallops.  Respiratory: Clear to auscultation bilaterally. GI: Soft, nontender, non-distended  MS: No edema; No deformity. Neuro:  Nonfocal  Psych: Normal affect   Labs    Chemistry Recent Labs  Lab 09/03/18 2154 09/04/18 0351 09/04/18 0420  NA 135  --   --   K 3.8  --   --   CL 103  --   --   CO2 21*  --   --   GLUCOSE 151*  --   --   BUN 18  --   --   CREATININE 0.96 0.70 0.85  CALCIUM 9.1  --   --   PROT 7.1  --   --   ALBUMIN 3.6  --   --   AST 14*  --   --   ALT 13  --   --   ALKPHOS 53  --   --   BILITOT 0.7  --   --   GFRNONAA >60  --  >60  GFRAA >60  --  >60  ANIONGAP 11  --   --      Hematology Recent Labs  Lab 09/03/18 2154 09/04/18 0319  WBC 13.1* 15.5*  RBC  4.80 4.83  HGB 15.0 14.5  HCT 44.7 44.6  MCV 93.1 92.3  MCH 31.3 30.0  MCHC 33.6 32.5  RDW 12.3 12.2  PLT 243 212    Cardiac Enzymes Recent Labs  Lab 09/03/18 2154 09/04/18 0641  TROPONINI <0.03 <0.03   No results for input(s): TROPIPOC in the last 168 hours.   BNPNo results for input(s): BNP, PROBNP in the last 168 hours.   DDimer No results for input(s): DDIMER in the last 168 hours.   Radiology    Dg Chest 2 View  Result Date: 09/03/2018 CLINICAL DATA:  68 y/o  M; 1 week of chest pain worsening tonight. EXAM: CHEST - 2 VIEW COMPARISON:  01/20/2018 chest radiograph FINDINGS: Stable cardiac silhouette within normal limits given projection and technique. Aortic atherosclerosis with calcification. Clear lungs. No pleural effusion or pneumothorax. No acute osseous abnormality is evident. Partially visualized rotator cuff surgical repair bilaterally. IMPRESSION: No acute pulmonary process identified. Stable cardiac silhouette. Aortic Atherosclerosis (ICD10-I70.0). Electronically Signed   By: Kristine Garbe M.D.   On: 09/03/2018 22:27    Ct Angio Chest/abd/pel For Dissection W And/or Wo Contrast  Result Date: 09/04/2018 CLINICAL DATA:  Chest pain for 1 week EXAM: CT ANGIOGRAPHY CHEST, ABDOMEN AND PELVIS TECHNIQUE: Multidetector CT imaging through the chest, abdomen and pelvis was performed using the standard protocol during bolus administration of intravenous contrast. Multiplanar reconstructed images and MIPs were obtained and reviewed to evaluate the vascular anatomy. CONTRAST:  177mL OMNIPAQUE 350 COMPARISON:  Chest x-ray from earlier in the same day. FINDINGS: CTA CHEST FINDINGS Cardiovascular: Thoracic aorta demonstrates mild atherosclerotic calcifications without evidence of aneurysmal dilatation or dissection. No cardiac enlargement is seen. Mild coronary calcifications are noted. Pulmonary artery is well visualized without evidence of pulmonary embolism. Minimal pericardial effusion is noted. This is new from the prior exam. Mediastinum/Nodes: Thoracic inlet is within normal limits. Scattered mediastinal lymph nodes are noted similar to that seen on prior CT. No significant adenopathy is noted. Multiple calcified hilar nodes are noted consistent with prior granulomatous disease. The esophagus is within normal limits. Lungs/Pleura: Mild emphysematous changes are noted. Mild bibasilar atelectatic changes are noted. Calcified pleural plaques are seen. No focal confluent infiltrate is seen. Musculoskeletal: Degenerative changes of the thoracic spine are seen. No acute rib abnormality is noted. Review of the MIP images confirms the above findings. CTA ABDOMEN AND PELVIS FINDINGS VASCULAR Aorta: The abdominal aorta is within normal limits. Normal branching pattern is noted. Mild aortic calcifications are seen without aneurysmal dilatation. Celiac: Patent without evidence of aneurysm, dissection, vasculitis or significant stenosis. SMA: Patent without evidence of aneurysm, dissection, vasculitis or significant stenosis. Renals: Dual renal  arteries are noted on the left. Single renal artery on the right. No focal stenosis is noted. IMA: Patent without evidence of aneurysm, dissection, vasculitis or significant stenosis. Iliacs: Mild atherosclerotic changes are noted without aneurysmal dilatation. Veins: No venous abnormality is noted. Review of the MIP images confirms the above findings. NON-VASCULAR Hepatobiliary: No focal liver abnormality is seen. No gallstones, gallbladder wall thickening, or biliary dilatation. Pancreas: Unremarkable. No pancreatic ductal dilatation or surrounding inflammatory changes. Spleen: Normal in size without focal abnormality. Adrenals/Urinary Tract: Adrenal glands are within normal limits. Kidneys are well visualized bilaterally without renal calculi or obstructive changes. The bladder is decompressed. Stomach/Bowel: Scattered diverticular change of the colon is noted without diverticulitis. No obstructive or inflammatory changes are seen. The appendix has been surgically removed. No gastric abnormality is seen. Lymphatic: No significant lymphadenopathy is  noted. Reproductive: Prostate is unremarkable. Other: No abdominal wall hernia or abnormality. No abdominopelvic ascites. Musculoskeletal: Degenerative changes of the lumbar spine are noted. Review of the MIP images confirms the above findings. IMPRESSION: Atherosclerotic changes of the aorta without aneurysmal dilatation or dissection. Emphysematous changes and prior granulomatous changes. Diverticulosis without diverticulitis. Small pericardial effusion No other focal abnormality is noted. Aortic Atherosclerosis (ICD10-I70.0) and Emphysema (ICD10-J43.9). Electronically Signed   By: Inez Catalina M.D.   On: 09/04/2018 00:35    Cardiac Studies   Echo ordered   Cardiac cath 01/21/18   Ost Cx to Prox Cx lesion is 80% stenosed.  Balloon angioplasty was performed using a BALLOON SAPPHIRE 2.0X15. Post intervention, there is a 40% residual stenosis.  Dist Cx-1  lesion is 80% stenosed.  Balloon angioplasty was performed using a BALLOON SAPPHIRE 2.0X15. Post intervention, there is a 60% residual stenosis.  Dist Cx-2 lesion is 100% stenosed. Post intervention, there is a 100% residual stenosis. --Unable to cross the lesion with wire or balloon  ------------  Ramus lesion is 95% stenosed.  A drug-eluting stent was successfully placed using a STENT SYNERGY DES 2.25X12.  Post intervention, there is a 0% residual stenosis.  ---------------------------  Dist (apical) LAD lesion is 100% stenosed. Likely CTO  RPDA-1 lesion is 80% stenosed. RPDA-2 lesion is 70% stenosed.  Ostial Post Atrio-1 lesion is 80% stenosed. Post Atrio-2 lesion is 60% stenosed.  ----------------------------  The left ventricular systolic function is normal. The left ventricular ejection fraction is 50-55% by visual estimate. LV end diastolic pressure is normal.   Severe multivessel disease, unclear what the culprit lesion is.  100% occlusion of the distal circumflex is not likely the culprit lesion as this was not able to be crossed with a wire.  The apical LAD is subtotally occluded and this could also be the source of his pain -not good PCI target  There is a 95% Ramus Intermedius lesion that was treated with DES stent (Synergy DES 2.25 mm x 12 mm postdilated 2.4 mm), without resolution of pain.  There is also ostial RPAV 80-85%.  LV gram suggests apical hypokinesis and perhaps some mild anterolateral hypokinesis as well.  Relatively normal LVEDP.  Plan: Admit to CCU for now for post PCI care.With no obvious culprit lesion, will need to treat pain with analgesics --he has Dilaudid ordered from any pain which will be continued. Will run Aggrastat for 6 hours in case this may help reperfuse the distal LAD, otherwise no clear obvious PCI target to treat his pain at this point. Convert from pravastatin to atorvastatin; if blood pressure tolerates, start low-dose  beta-blocker.  We will need to reevaluate his symptoms, and determine potential benefit from treating the lesions of the distal right system. Otherwise no viable PCI target options remain.  Continue COPD medications  Recommend uninterrupted dual antiplatelet therapy with Aspirin 81mg  daily and Ticagrelor 90mg  twice daily for a minimum of But would be okay to stop aspirin after 3 to 6 months.  Diagnostic  Dominance: Right    Intervention       Patient Profile     68 y.o. male with known multivessel coronary artery disease, initially presenting with chest pain with both typical and atypical features, cardiac enzyme negative.  However, the following morning he develops acute inferior ST segment elevation consistent with an acute inferior lateral MI  Assessment & Plan    1.  Acute inferolateral STEMI: Patient with known CAD.  Cardiac cath data reviewed from last  year.  Potential culprits include the left circumflex, stented intermediate branch, or native RCA.  The patient is on IV heparin.  Will reload him with ticagrelor 180 mg just in case he has not been as compliant as reported.  He received Lovenox at 0200 this morning.  We will plan to perform his cardiac catheterization via a radial approach and use IV heparin.  Further plan/disposition pending his cardiac catheterization results.  Data is reviewed including lab data and CT scan data.  He has no acute dissection or other acute vascular abnormality identified on his CT studies.  I extensively reviewed risks, indications, and alternatives to emergency cardiac catheterization and possible PCI with the patient.  Emergency implied consent is obtained.  2.  Tobacco abuse: Cessation counseling done  3.  Type 2 diabetes: Managed with sliding scale insulin while hospitalized.  4.  Mixed hyperlipidemia: Continue high intensity statin and TriCor in combination.  5.  Hypertension: Controlled on current medical therapy.  6.   Leukocytosis: Likely secondary to acute myocardial infarction.  The patient is critically ill with multiple organ systems failure and requires high complexity decision making for assessment and support, frequent evaluation and titration of therapies, application of advanced monitoring technologies and extensive interpretation of multiple databases.   Critical Care Time devoted to patient care services described in this note is 40 minutes.  Spoke with Dr Sherral Hammers. Will assume primary responsibility for the patient's care since he has developed STEMI this morning.    For questions or updates, please contact [place] Please consult [email or web address] for contact info under        Signed, Sherren Mocha, MD  09/04/2018, 8:36 AM

## 2018-09-04 NOTE — TOC Benefit Eligibility Note (Signed)
Transition of Care [facility]) Benefit Eligibility Note    Patient Details  Name: Andrew [name] MRN: [id number] Date of Birth: 10-Jan-1951   Medication/Dose: Kary Kos  87 MG BID(TICRAGRELOR : NON- FORMULARYYES)  Covered?: Yes     Prescription Coverage Preferred Pharmacy: YES([facility])  Spoke with Person/Company/Phone Number:: YOLANDA([facility] # [phone] [employer])  Co-Pay: $94.00  Prior Approval: No  Deductible: Met(OUT-OF-POCKET: NOT MET)       Memory Argue Phone Number: 09/04/2018, 1:54 PM

## 2018-09-04 NOTE — Progress Notes (Signed)
Cardiac Rehab Advisory Cardiac Rehab Phase I is not seeing pts face to face at this time due to Covid 19 restrictions. Ambulation is occurring through nursing, PT, and mobility teams. We will help facilitate that process as needed. We are calling pts in their rooms and discussing education. We will then deliver education materials to pts RN for delivery to pt.    Called pt however he sts he has been quite busy today and is now on the phone with his daughter and would like me to call back tomorrow. I left his education materials with his RN. Encouraged pt to read these and we will discuss tomorrow. [place], ACSM 12:56 PM 09/04/2018

## 2018-09-05 ENCOUNTER — Inpatient Hospital Stay (HOSPITAL_COMMUNITY): Payer: Medicare Other

## 2018-09-05 ENCOUNTER — Encounter (HOSPITAL_COMMUNITY): Payer: Self-pay | Admitting: Cardiovascular Disease

## 2018-09-05 ENCOUNTER — Telehealth: Payer: Self-pay | Admitting: Student

## 2018-09-05 DIAGNOSIS — I252 Old myocardial infarction: Secondary | ICD-10-CM

## 2018-09-05 DIAGNOSIS — I301 Infective pericarditis: Secondary | ICD-10-CM

## 2018-09-05 DIAGNOSIS — I2119 ST elevation (STEMI) myocardial infarction involving other coronary artery of inferior wall: Secondary | ICD-10-CM

## 2018-09-05 DIAGNOSIS — I309 Acute pericarditis, unspecified: Secondary | ICD-10-CM

## 2018-09-05 LAB — BASIC METABOLIC PANEL
Anion gap: 11 (ref 5–15)
BUN: 15 mg/dL (ref 8–23)
CO2: 20 mmol/L — ABNORMAL LOW (ref 22–32)
Calcium: 9 mg/dL (ref 8.9–10.3)
Chloride: 107 mmol/L (ref 98–111)
Creatinine, Ser: 0.84 mg/dL (ref 0.61–1.24)
GFR calc Af Amer: >60 mL/min (ref >60)
GFR calc non Af Amer: >60 mL/min (ref >60)
Glucose, Bld: 141 mg/dL — ABNORMAL HIGH (ref 70–99)
Potassium: 4 mmol/L (ref 3.5–5.1)
Sodium: 138 mmol/L (ref 135–145)

## 2018-09-05 LAB — GLUCOSE, CAPILLARY
Glucose-Capillary: 153 mg/dL — ABNORMAL HIGH (ref 70–99)
Glucose-Capillary: 132 mg/dL — ABNORMAL HIGH (ref 70–99)

## 2018-09-05 LAB — RESPIRATORY PANEL BY PCR

## 2018-09-05 LAB — CBC
HCT: 40.3 % (ref 39.0–52.0)
Hemoglobin: 13.2 g/dL (ref 13.0–17.0)
MCH: 30.8 pg (ref 26.0–34.0)
MCHC: 32.8 g/dL (ref 30.0–36.0)
MCV: 94.2 fL (ref 80.0–100.0)
Platelets: 223 10*3/uL (ref 150–400)
RBC: 4.28 MIL/uL (ref 4.22–5.81)
RDW: 12.8 % (ref 11.5–15.5)
WBC: 11.6 10*3/uL — ABNORMAL HIGH (ref 4.0–10.5)
nRBC: 0 % (ref 0.0–0.2)

## 2018-09-05 LAB — SARS CORONAVIRUS 2 BY RT PCR (HOSPITAL ORDER, PERFORMED IN ~~LOC~~ HOSPITAL LAB): SARS Coronavirus 2: NEGATIVE

## 2018-09-05 LAB — C-REACTIVE PROTEIN: CRP: 21 mg/dL — ABNORMAL HIGH (ref <1.0)

## 2018-09-05 LAB — SEDIMENTATION RATE: Sed Rate: 48 mm/hr — ABNORMAL HIGH (ref 0–16)

## 2018-09-05 LAB — PLATELET INHIBITION P2Y12: Platelet Function  P2Y12: 60 [PRU] — ABNORMAL LOW (ref 182–335)

## 2018-09-05 MED ORDER — TICAGRELOR 90 MG PO TABS: 90.0000 mg | ORAL_TABLET | Freq: Two times a day (BID) | ORAL | 11 refills | Status: DC

## 2018-09-05 MED ORDER — FUROSEMIDE 10 MG/ML IJ SOLN
20.0000 mg | Freq: Once | INTRAMUSCULAR | Status: AC
  Administered 2018-09-05: 20 mg via INTRAVENOUS
  Filled 2018-09-05: qty 2

## 2018-09-05 MED ORDER — COLCHICINE 0.6 MG PO TABS
0.6000 mg | ORAL_TABLET | Freq: Two times a day (BID) | ORAL | Status: DC
  Administered 2018-09-05: 0.6 mg via ORAL
  Filled 2018-09-05: qty 1

## 2018-09-05 MED ORDER — CLOPIDOGREL BISULFATE 75 MG PO TABS: 75.0000 mg | ORAL_TABLET | Freq: Every day | ORAL | 11 refills | Status: DC

## 2018-09-05 MED ORDER — NITROGLYCERIN 0.4 MG SL SUBL: 0.4000 mg | SUBLINGUAL_TABLET | SUBLINGUAL | 2 refills | Status: DC | PRN

## 2018-09-05 MED ORDER — COLCHICINE 0.6 MG PO TABS: 0.6000 mg | ORAL_TABLET | Freq: Two times a day (BID) | ORAL | 2 refills | Status: DC

## 2018-09-05 MED ORDER — CLOPIDOGREL BISULFATE 300 MG PO TABS
300.0000 mg | ORAL_TABLET | Freq: Once | ORAL | Status: AC
  Administered 2018-09-05: 300 mg via ORAL
  Filled 2018-09-05: qty 1

## 2018-09-05 MED FILL — Ticagrelor Tab 90 MG: ORAL | Qty: 1 | Status: AC

## 2018-09-05 MED FILL — Nitroglycerin IV Soln 100 MCG/ML in D5W: INTRA_ARTERIAL | Qty: 10 | Status: AC

## 2018-09-05 MED FILL — COLCHICINE 0.6 MG TABS: 0.6 | 1 days supply | Qty: 2 | Fill #0 | Status: TO

## 2018-09-05 MED FILL — CLOPIDOGREL 75 MG TABLET: 75 | 30 days supply | Qty: 30 | Fill #0 | Status: TO

## 2018-09-05 NOTE — TOC Transition Note (Signed)
Transition of Care [facility]) - CM/SW Discharge Note   Patient Details  Name: Xzavian Semmel MRN: [id number] Date of Birth: [birth year]  [facility]) CM/SW Contact:  Georgeanna Lea, RN Phone Number: 09/05/2018, 3:18 PM   Clinical Narrative:    Patient to transition home on Brilinta and Colchicine to be filled by [facility] and delivered prior to the patient transitioning home. CM spoke to the patient via phone, with patient stating he will now transition home on Plavix instead of Brilinta. Colchicine benefits check was sent and is still pending. Patient is agreeable to CM contacting him at home to discuss his est copay once obtained.    Final next level of care: Home/Self Care Barriers to Discharge: No Barriers Identified

## 2018-09-05 NOTE — Progress Notes (Signed)
thnx  [facility]

## 2018-09-05 NOTE — Progress Notes (Addendum)
Progress Note  Patient Name: Andrew [name] Date of Encounter: 09/05/2018  Primary Cardiologist: Glenetta Hew, MD   Subjective   Underwent POBA of R PDA yesterday for suspected inferior STEMI. Post-PCI continued to have pain and pleuritic/pericarditc pain also suspected.   Started on colchicine and vicodin. CP much better but still hurts when he takes a deep breath or moves too quickly. Slept better. Has non-productive cough. No fever. No orthopnea. CRP markedly elevated.   ECG with small inferior Qs (unchanged) with mild ST elevation particularly on inferior leads and diffuse PR depression  Echo EF 45-50% with old RWMAs. Small pericardial effusion.   Inpatient Medications    Scheduled Meds: . aspirin EC  81 mg Oral Daily  . bisoprolol  2.5 mg Oral Daily  . colchicine  0.6 mg Oral BID  . enoxaparin (LOVENOX) injection  40 mg Subcutaneous Q24H  . fenofibrate  54 mg Oral Daily  . furosemide  20 mg Intravenous Once  . insulin aspart  0-5 Units Subcutaneous QHS  . insulin aspart  0-9 Units Subcutaneous TID WC  . isosorbide mononitrate  15 mg Oral Daily  . lisinopril  5 mg Oral Daily  . pantoprazole  40 mg Oral Daily  . primidone  50 mg Oral QID  . rosuvastatin  40 mg Oral Daily  . sodium chloride flush  3 mL Intravenous Q12H  . tamsulosin  0.4 mg Oral Daily  . ticagrelor  90 mg Oral BID   Continuous Infusions: . sodium chloride 250 mL (09/04/18 1922)   PRN Meds: sodium chloride, acetaminophen, ALPRAZolam, HYDROcodone-acetaminophen, morphine injection, nitroGLYCERIN, ondansetron (ZOFRAN) IV, sodium chloride flush   Vital Signs    Vitals:   09/05/18 0300 09/05/18 0400 09/05/18 0500 09/05/18 0600  BP: 99/64 105/62 103/66 109/63  Pulse: 86 87 83 88  Resp: (!) 22 20 20 20   Temp:  98.6 F (37 C)    TempSrc:  Oral    SpO2: 95% 91% 94% 95%  Weight:      Height:        Intake/Output Summary (Last 24 hours) at 09/05/2018 0850 Last data filed at 09/05/2018 0600 Gross per  24 hour  Intake 1011 ml  Output 2100 ml  Net -1089 ml   Last 3 Weights 09/03/2018 08/02/2018 07/31/2018  Weight (lbs) 205 lb 202 lb 9.6 oz 201 lb  Weight (kg) 92.987 kg 91.899 kg 91.173 kg      Telemetry    Sinus 80s Personally reviewed   ECG    See HPI  Physical Exam  General:  Well appearing. No resp difficulty HEENT: normal Neck: supple. no JVD. Carotids 2+ bilat; no bruits. No lymphadenopathy or thryomegaly appreciated. Cor: PMI nondisplaced. Regular rate & rhythm. No rubs Lungs: clear x for crackles in R base Abdomen: soft, nontender, nondistended. No hepatosplenomegaly. No bruits or masses. Good bowel sounds. Extremities: no cyanosis, clubbing, rash, edema Neuro: alert & orientedx3, cranial nerves grossly intact. moves all 4 extremities w/o difficulty. Affect pleasant  Labs    Chemistry Recent Labs  Lab 09/03/18 2154 09/04/18 0351 09/04/18 0420 09/05/18 0638  NA 135  --  136 138  K 3.8  --  4.4 4.0  CL 103  --  107 107  CO2 21*  --  18* 20*  GLUCOSE 151*  --  181* 141*  BUN 18  --  16 15  CREATININE 0.96 0.70 0.82  0.85 0.84  CALCIUM 9.1  --  9.2 9.0  PROT 7.1  --   --   --  ALBUMIN 3.6  --   --   --   AST 14*  --   --   --   ALT 13  --   --   --   ALKPHOS 53  --   --   --   BILITOT 0.7  --   --   --   GFRNONAA >60  --  >60  >60 >60  GFRAA >60  --  >60  >60 >60  ANIONGAP 11  --  11 11     Hematology Recent Labs  Lab 09/03/18 2154 09/04/18 0319 09/05/18 0638  WBC 13.1* 15.5* 11.6*  RBC 4.80 4.83 4.28  HGB 15.0 14.5 13.2  HCT 44.7 44.6 40.3  MCV 93.1 92.3 94.2  MCH 31.3 30.0 30.8  MCHC 33.6 32.5 32.8  RDW 12.3 12.2 12.8  PLT 243 212 223    Cardiac Enzymes Recent Labs  Lab 09/04/18 0641 09/04/18 1029 09/04/18 1559 09/04/18 2240  TROPONINI <0.03 <0.03 <0.03 <0.03   No results for input(s): TROPIPOC in the last 168 hours.   BNPNo results for input(s): BNP, PROBNP in the last 168 hours.   DDimer No results for input(s): DDIMER in  the last 168 hours.   Radiology    Dg Chest 2 View  Result Date: 09/03/2018 CLINICAL DATA:  68 y/o  M; 1 week of chest pain worsening tonight. EXAM: CHEST - 2 VIEW COMPARISON:  01/20/2018 chest radiograph FINDINGS: Stable cardiac silhouette within normal limits given projection and technique. Aortic atherosclerosis with calcification. Clear lungs. No pleural effusion or pneumothorax. No acute osseous abnormality is evident. Partially visualized rotator cuff surgical repair bilaterally. IMPRESSION: No acute pulmonary process identified. Stable cardiac silhouette. Aortic Atherosclerosis (ICD10-I70.0). Electronically Signed   By: Kristine Garbe M.D.   On: 09/03/2018 22:27   Ct Angio Chest/abd/pel For Dissection W And/or Wo Contrast  Result Date: 09/04/2018 CLINICAL DATA:  Chest pain for 1 week EXAM: CT ANGIOGRAPHY CHEST, ABDOMEN AND PELVIS TECHNIQUE: Multidetector CT imaging through the chest, abdomen and pelvis was performed using the standard protocol during bolus administration of intravenous contrast. Multiplanar reconstructed images and MIPs were obtained and reviewed to evaluate the vascular anatomy. CONTRAST:  116mL OMNIPAQUE 350 COMPARISON:  Chest x-ray from earlier in the same day. FINDINGS: CTA CHEST FINDINGS Cardiovascular: Thoracic aorta demonstrates mild atherosclerotic calcifications without evidence of aneurysmal dilatation or dissection. No cardiac enlargement is seen. Mild coronary calcifications are noted. Pulmonary artery is well visualized without evidence of pulmonary embolism. Minimal pericardial effusion is noted. This is new from the prior exam. Mediastinum/Nodes: Thoracic inlet is within normal limits. Scattered mediastinal lymph nodes are noted similar to that seen on prior CT. No significant adenopathy is noted. Multiple calcified hilar nodes are noted consistent with prior granulomatous disease. The esophagus is within normal limits. Lungs/Pleura: Mild emphysematous  changes are noted. Mild bibasilar atelectatic changes are noted. Calcified pleural plaques are seen. No focal confluent infiltrate is seen. Musculoskeletal: Degenerative changes of the thoracic spine are seen. No acute rib abnormality is noted. Review of the MIP images confirms the above findings. CTA ABDOMEN AND PELVIS FINDINGS VASCULAR Aorta: The abdominal aorta is within normal limits. Normal branching pattern is noted. Mild aortic calcifications are seen without aneurysmal dilatation. Celiac: Patent without evidence of aneurysm, dissection, vasculitis or significant stenosis. SMA: Patent without evidence of aneurysm, dissection, vasculitis or significant stenosis. Renals: Dual renal arteries are noted on the left. Single renal artery on the right. No focal stenosis is noted. IMA: Patent  without evidence of aneurysm, dissection, vasculitis or significant stenosis. Iliacs: Mild atherosclerotic changes are noted without aneurysmal dilatation. Veins: No venous abnormality is noted. Review of the MIP images confirms the above findings. NON-VASCULAR Hepatobiliary: No focal liver abnormality is seen. No gallstones, gallbladder wall thickening, or biliary dilatation. Pancreas: Unremarkable. No pancreatic ductal dilatation or surrounding inflammatory changes. Spleen: Normal in size without focal abnormality. Adrenals/Urinary Tract: Adrenal glands are within normal limits. Kidneys are well visualized bilaterally without renal calculi or obstructive changes. The bladder is decompressed. Stomach/Bowel: Scattered diverticular change of the colon is noted without diverticulitis. No obstructive or inflammatory changes are seen. The appendix has been surgically removed. No gastric abnormality is seen. Lymphatic: No significant lymphadenopathy is noted. Reproductive: Prostate is unremarkable. Other: No abdominal wall hernia or abnormality. No abdominopelvic ascites. Musculoskeletal: Degenerative changes of the lumbar spine are  noted. Review of the MIP images confirms the above findings. IMPRESSION: Atherosclerotic changes of the aorta without aneurysmal dilatation or dissection. Emphysematous changes and prior granulomatous changes. Diverticulosis without diverticulitis. Small pericardial effusion No other focal abnormality is noted. Aortic Atherosclerosis (ICD10-I70.0) and Emphysema (ICD10-J43.9). Electronically Signed   By: Inez Catalina M.D.   On: 09/04/2018 00:35    Cardiac Studies   Echo ordered   Cardiac cath 01/21/18   Ost Cx to Prox Cx lesion is 80% stenosed.  Balloon angioplasty was performed using a BALLOON SAPPHIRE 2.0X15. Post intervention, there is a 40% residual stenosis.  Dist Cx-1 lesion is 80% stenosed.  Balloon angioplasty was performed using a BALLOON SAPPHIRE 2.0X15. Post intervention, there is a 60% residual stenosis.  Dist Cx-2 lesion is 100% stenosed. Post intervention, there is a 100% residual stenosis. --Unable to cross the lesion with wire or balloon  ------------  Ramus lesion is 95% stenosed.  A drug-eluting stent was successfully placed using a STENT SYNERGY DES 2.25X12.  Post intervention, there is a 0% residual stenosis.  ---------------------------  Dist (apical) LAD lesion is 100% stenosed. Likely CTO  RPDA-1 lesion is 80% stenosed. RPDA-2 lesion is 70% stenosed.  Ostial Post Atrio-1 lesion is 80% stenosed. Post Atrio-2 lesion is 60% stenosed.  ----------------------------  The left ventricular systolic function is normal. The left ventricular ejection fraction is 50-55% by visual estimate. LV end diastolic pressure is normal.   Severe multivessel disease, unclear what the culprit lesion is.  100% occlusion of the distal circumflex is not likely the culprit lesion as this was not able to be crossed with a wire.  The apical LAD is subtotally occluded and this could also be the source of his pain -not good PCI target  There is a 95% Ramus Intermedius lesion  that was treated with DES stent (Synergy DES 2.25 mm x 12 mm postdilated 2.4 mm), without resolution of pain.  There is also ostial RPAV 80-85%.  LV gram suggests apical hypokinesis and perhaps some mild anterolateral hypokinesis as well.  Relatively normal LVEDP.  Plan: Admit to CCU for now for post PCI care.With no obvious culprit lesion, will need to treat pain with analgesics --he has Dilaudid ordered from any pain which will be continued. Will run Aggrastat for 6 hours in case this may help reperfuse the distal LAD, otherwise no clear obvious PCI target to treat his pain at this point. Convert from pravastatin to atorvastatin; if blood pressure tolerates, start low-dose beta-blocker.  We will need to reevaluate his symptoms, and determine potential benefit from treating the lesions of the distal right system. Otherwise no viable PCI target  options remain.  Continue COPD medications  Recommend uninterrupted dual antiplatelet therapy with Aspirin 81mg  daily and Ticagrelor 90mg  twice daily for a minimum of But would be okay to stop aspirin after 3 to 6 months.  Diagnostic  Dominance: Right    Intervention       Patient Profile     68 y.o. male with known multivessel coronary artery disease, initially presenting with chest pain with both typical and atypical features, cardiac enzyme negative.  However, the following morning he develops acute inferior ST segment elevation consistent with an acute inferior lateral MI  Assessment & Plan    1.  Acute inferolateral STEMI: - s/p POBA of R PDA yesterday.  - diffuse 3v CAD otherwise stable on cath  - peak trop<0.03 - continue DAPT, statin and b-blocker. Also continue ACE-I and Imdur (on PTA)  2. Acute viral pleuro-pericarditis - given normal troponins, ECG,echo and cath findings and markedly elevated CRP, suspect pain in more pleuritic/pericarditic in nature - will repeat CXR. Low suspicion for corona virus currently but  will await CXR and check with ID regarding rapid testing. Check viral panel. Remains afebrile.   3.  Tobacco abuse:  - Counseled on smoking cessation  4.  Type 2 diabetes:  - Managed with sliding scale insulin while hospitalized. - Continue invokana  5.  Mixed hyperlipidemia: Continue high intensity statin and TriCor in combination.  6.  Hypertension: Controlled on current medical therapy.  Can likely go home later today if pain sufficiently controlled and ambulates well.      For questions or updates, please contact [place] Please consult [email or web address] for contact info under        Signed, Glori Bickers, MD  09/05/2018, 8:50 AM

## 2018-09-05 NOTE — Discharge Summary (Addendum)
Discharge Summary    Patient ID: Andrew [name] MRN: [id number]; DOB: March 30, 1951  Admit date: 09/03/2018 Discharge date: 09/05/2018  Primary Care Provider: Dettinger, Andrew Kaufmann, MD  Primary Cardiologist: Andrew Hew, MD  Primary Electrophysiologist:  None   Discharge Diagnoses    Principal Problem:   Acute ST elevation myocardial infarction (STEMI) of inferolateral wall [facility]) Active Problems:   Tobacco use disorder   Type 2 diabetes mellitus ([place])   Hyperlipidemia   Essential hypertension   CAD   Chest pain   Pericarditis   Allergies Allergies  Allergen Reactions   Tramadol Shortness Of Breath    And dizziness   Trulicity [Dulaglutide] Swelling    Diagnostic Studies/Procedures    Left Heart Catheterization 09/04/2018: 1.  Multivessel coronary artery disease with chronic occlusion of the apical LAD and mid circumflex, continued patency of the stented segment in the ramus intermedius, and severe progressive stenosis of the right PDA 2.  Mild segmental contraction abnormality the left ventricle with preserved overall LVEF estimated at 55 to 60% 3.  Normal LVEDP 4.  Successful balloon angioplasty of the right PDA, treated with p.o. BA because of small vessel caliber  Recommendations: Check 2D echo, overnight care in the cardiac ICU, post MI medical therapy.  We will cycle cardiac enzymes.  The patient has had fairly prolonged symptoms with negative cardiac markers.  Other considerations include pericarditis or myopericarditis.  However, with his most pronounced ST elevation inferiorly and severe stenosis in the right PDA, I elected to proceed with PCI for threatened vessel closure as outlined above. _____________   Echocardiogram 09/04/2018: Impressions:  1. The left ventricle has mildly reduced systolic function, with an ejection fraction of 45-50%. The cavity size was normal. Left ventricular diastolic Doppler parameters are consistent with impaired relaxation.  2.  Severe hypokinesis of the anterolateral and inferolateral myocardium.  3. The right ventricle has normal systolc function. The cavity was normal. There is no increase in right ventricular wall thickness. Right ventricular systolic pressure could not be assessed.  4. Small pericardial effusion.  5. The pericardial effusion is anterior to the right ventricle.  6. Small to moderate pericardial effusion measuring up to 1.2 cm. There is no collapse of the RA/RV and no significant respiratory flow variability. Findings are not consistent with tamponade.  7. Moderate thickening of the aortic valve Moderate calcification of the aortic valve. Aortic valve regurgitation was not assessed by color flow Doppler.  8. Pulmonic valve regurgitation was not assessed by color flow Doppler.  9. The inferior vena cava was dilated in size with <50% respiratory variability.  History of Present Illness     [name] is a 68 year old male with a history of CAD with multivessel disease on last cardiac catheterization in 12/2017 s/p PCI with DES to 95% ramus intermedius lesion with residual disease in other vascular territories not amenable to intervention (chronic total occlusion of the distal LAD, left circumflex disease which was unable to be wired at last LHC, and multiple lesions in the right system that were not amenable to PCI) as well as prior stoke, sleep apnea on BiPAP, hypertension, hyperlipidemia, type 2 diabetes mellitus, GERD, and depression who is followed by Dr. Ellyn [name]. His multivessel CAD has been managed medically with Aspirin, Brilinta, Bisoprolol, Crestor, Lisinopril, and low dose Imdur of 15mg  daily. There have been some discussions by Dr. Ellyn [name] of increasing Imdur to 30mg  daily if angina continues.   Patient presented to the ED on 09/03/2018 for evaluation of  chest pain. Patient reports waxing and waning chest pain for at least a week prior to presentation. It is midsternal and epigastric and is not  improved with Nitroglycerin. No associated shortness of breath, nausea, diaphoresis, or any other associated symptoms. The pain does not seem to be made worse with exertion or activity but is worse with deep breathing or cough. Pain is also reproducible by palpation of left chest.   Initial EKG showed no acute ischemic changes and first troponin was negative. Patient was admitted for further evaluation.  Hospital Course     Consultants: None.  Acute Inferolateral STEMI Patient admitted on 09/03/2018 for chest pain as stated above. Troponin remained negative x5. Repeat EKG on the morning following admission showed ST elevations in inferior leads. Code STEMI was called and patient was taken to cath lab for emergency cardiac catheterization. Cardiac catheterization showed multivessel coronary artery disease with chronic occlusion of apical LAD and mid circumflex, patent stent to the ramus intermedius, and severe progressive stenosis of the right PDA. Patient underwent successful balloon angioplasty of the right PDA (treated with plan old balloon angioplasty because of small vessel caliber). Echocardiogram showed LVEF of 45 to 50% with severe hypokinesis of the anterolateral and inferolateral myocardium and a small pericardial effusion. Patient tolerated the procedure well. Renal function stable. Will make sure patient is able to ambulate prior to discharge. - Continue dual antiplatelet therapy with Aspirin and Plavix. Patient has been on Brilinta but has been receiving free samples in the office because he is unable to afford it. Patient states he has filled out the patient assistance form and thinks he was denied. Therefore, will transition to Plavix. Patient will receive loading dose of 300mg  prior to discharge and then start 75mg  daily tomorrow.  - Continue high-intensity statin and beta-blocker as well as ACEi and Imdur. - Will defer repeat Echo for follow-up of pericardial effusion to primary  Cardiologist.  Acute Viral Pleuro-Pericarditis - Given normal troponins, EKH, and echo and cath findings, CRP and Sed Rate were order for possible pericarditis or myopericarditis. CRP elevated at 21.0 and Sed Rate elevated at 48. Therefore, pain felt to be more pleuritic and pericarditic in nature. COVID-19 test came back negative. Respiratory panel negative. Will treat with Colchicine 0.6mg  twice daily. Will not add NSAIDs at this time even though COVID-19 test negative.  Hypertension Blood pressure has been well controlled. Continue home medications.  Hyperlipidemia Lipid panel this admission: Cholesterol 95, Triglycerides 56, HDL 45, LDL 39. At LDL goal of <70 given CAD. - Continue Crestor 40mg  daily. - Continue home Fenofibrate.  Type 2 Diabetes Mellitus - Hemoglobin A1c 8.0 this admission. - Home Metformin can be restarted 48 hours after cardiac catheterization (morning of 09/06/2018). - Continue home Alcolu and Jardiance.  Tobacco Abuse - Patient was counseled on complete smoking cessation.  Patient seen and examined by Dr. Haroldine Laws today and determined to be stable for discharge. Outpatient follow-up has been arranged and virtual TOC visit scheduled for 09/13/2018. Medications as below. _____________  Discharge Vitals Blood pressure (!) 101/59, pulse 76, temperature 98.4 F (36.9 C), temperature source Oral, resp. rate 10, height 6\' 3"  (1.905 m), weight 93 kg, SpO2 94 %.  Filed Weights   09/03/18 2147  Weight: 93 kg    Labs & Radiologic Studies    CBC Recent Labs    09/04/18 0319 09/05/18 0638  WBC 15.5* 11.6*  HGB 14.5 13.2  HCT 44.6 40.3  MCV 92.3 94.2  PLT 212 223  Basic Metabolic Panel Recent Labs    09/04/18 0420 09/05/18 0638  NA 136 138  K 4.4 4.0  CL 107 107  CO2 18* 20*  GLUCOSE 181* 141*  BUN 16 15  CREATININE 0.82   0.85 0.84  CALCIUM 9.2 9.0  MG 2.1  --    Liver Function Tests Recent Labs    09/03/18 2154  AST 14*  ALT 13  ALKPHOS  53  BILITOT 0.7  PROT 7.1  ALBUMIN 3.6   Recent Labs    09/03/18 2154  LIPASE 29   Cardiac Enzymes Recent Labs    09/04/18 1029 09/04/18 1559 09/04/18 2240  TROPONINI <0.03 <0.03 <0.03   BNP Invalid input(s): POCBNP D-Dimer No results for input(s): DDIMER in the last 72 hours. Hemoglobin A1C Recent Labs    09/04/18 0319  HGBA1C 8.0*   Fasting Lipid Panel Recent Labs    09/04/18 1029  CHOL 95  HDL 45  LDLCALC 39  TRIG 56  CHOLHDL 2.1   Thyroid Function Tests No results for input(s): TSH, T4TOTAL, T3FREE, THYROIDAB in the last 72 hours.  Invalid input(s): FREET3 _____________  Dg Chest 2 View  Result Date: 09/03/2018 CLINICAL DATA:  68 y/o  M; 1 week of chest pain worsening tonight. EXAM: CHEST - 2 VIEW COMPARISON:  01/20/2018 chest radiograph FINDINGS: Stable cardiac silhouette within normal limits given projection and technique. Aortic atherosclerosis with calcification. Clear lungs. No pleural effusion or pneumothorax. No acute osseous abnormality is evident. Partially visualized rotator cuff surgical repair bilaterally. IMPRESSION: No acute pulmonary process identified. Stable cardiac silhouette. Aortic Atherosclerosis (ICD10-I70.0). Electronically Signed   By: Kristine Garbe M.D.   On: 09/03/2018 22:27   Dg Chest Port 1 View  Result Date: 09/05/2018 CLINICAL DATA:  New onset cough. EXAM: PORTABLE CHEST 1 VIEW COMPARISON:  CT chest from yesterday. Chest x-ray dated September 03, 2018. FINDINGS: The heart size and mediastinal contours are within normal limits. Normal pulmonary vascularity. Atherosclerotic calcification of the aortic arch. Chronic, mild interstitial prominence is similar to prior studies and likely smoking-related. No focal consolidation, pleural effusion, or pneumothorax. Unchanged punctate calcified granuloma in the left upper lobe. No acute osseous abnormality. IMPRESSION: No active disease. Electronically Signed   By: Titus Dubin M.D.    On: 09/05/2018 10:04   Ct Angio Chest/abd/pel For Dissection W And/or Wo Contrast  Result Date: 09/04/2018 CLINICAL DATA:  Chest pain for 1 week EXAM: CT ANGIOGRAPHY CHEST, ABDOMEN AND PELVIS TECHNIQUE: Multidetector CT imaging through the chest, abdomen and pelvis was performed using the standard protocol during bolus administration of intravenous contrast. Multiplanar reconstructed images and MIPs were obtained and reviewed to evaluate the vascular anatomy. CONTRAST:  156mL OMNIPAQUE 350 COMPARISON:  Chest x-ray from earlier in the same day. FINDINGS: CTA CHEST FINDINGS Cardiovascular: Thoracic aorta demonstrates mild atherosclerotic calcifications without evidence of aneurysmal dilatation or dissection. No cardiac enlargement is seen. Mild coronary calcifications are noted. Pulmonary artery is well visualized without evidence of pulmonary embolism. Minimal pericardial effusion is noted. This is new from the prior exam. Mediastinum/Nodes: Thoracic inlet is within normal limits. Scattered mediastinal lymph nodes are noted similar to that seen on prior CT. No significant adenopathy is noted. Multiple calcified hilar nodes are noted consistent with prior granulomatous disease. The esophagus is within normal limits. Lungs/Pleura: Mild emphysematous changes are noted. Mild bibasilar atelectatic changes are noted. Calcified pleural plaques are seen. No focal confluent infiltrate is seen. Musculoskeletal: Degenerative changes of the thoracic spine are seen. No  acute rib abnormality is noted. Review of the MIP images confirms the above findings. CTA ABDOMEN AND PELVIS FINDINGS VASCULAR Aorta: The abdominal aorta is within normal limits. Normal branching pattern is noted. Mild aortic calcifications are seen without aneurysmal dilatation. Celiac: Patent without evidence of aneurysm, dissection, vasculitis or significant stenosis. SMA: Patent without evidence of aneurysm, dissection, vasculitis or significant stenosis.  Renals: Dual renal arteries are noted on the left. Single renal artery on the right. No focal stenosis is noted. IMA: Patent without evidence of aneurysm, dissection, vasculitis or significant stenosis. Iliacs: Mild atherosclerotic changes are noted without aneurysmal dilatation. Veins: No venous abnormality is noted. Review of the MIP images confirms the above findings. NON-VASCULAR Hepatobiliary: No focal liver abnormality is seen. No gallstones, gallbladder wall thickening, or biliary dilatation. Pancreas: Unremarkable. No pancreatic ductal dilatation or surrounding inflammatory changes. Spleen: Normal in size without focal abnormality. Adrenals/Urinary Tract: Adrenal glands are within normal limits. Kidneys are well visualized bilaterally without renal calculi or obstructive changes. The bladder is decompressed. Stomach/Bowel: Scattered diverticular change of the colon is noted without diverticulitis. No obstructive or inflammatory changes are seen. The appendix has been surgically removed. No gastric abnormality is seen. Lymphatic: No significant lymphadenopathy is noted. Reproductive: Prostate is unremarkable. Other: No abdominal wall hernia or abnormality. No abdominopelvic ascites. Musculoskeletal: Degenerative changes of the lumbar spine are noted. Review of the MIP images confirms the above findings. IMPRESSION: Atherosclerotic changes of the aorta without aneurysmal dilatation or dissection. Emphysematous changes and prior granulomatous changes. Diverticulosis without diverticulitis. Small pericardial effusion No other focal abnormality is noted. Aortic Atherosclerosis (ICD10-I70.0) and Emphysema (ICD10-J43.9). Electronically Signed   By: Inez Catalina M.D.   On: 09/04/2018 00:35   Disposition   Patient is being discharged home today in good condition.  Follow-up Plans & Appointments    Follow-up Information    Lendon Colonel, NP Follow up.   Specialties:  Nurse Practitioner, Radiology,  Cardiology Why:  You have a virtual follow-up visit scheduled fo 09/13/2018 at 9:00am with Jory Sims, one of Dr. Allison Quarry NPs. Contact information: [address] [place] [address] [phone]          Discharge Instructions    Amb Referral to Cardiac Rehabilitation   Complete by:  As directed    Diagnosis:  PTCA   Diet - low sodium heart healthy   Complete by:  As directed    Increase activity slowly   Complete by:  As directed       Discharge Medications   Allergies as of 09/05/2018      Reactions   Tramadol Shortness Of Breath   And dizziness   Trulicity [dulaglutide] Swelling      Medication List    STOP taking these medications   naproxen sodium 220 MG tablet Commonly known as:  ALEVE   ticagrelor 90 MG Tabs tablet Commonly known as:  BRILINTA     TAKE these medications   aspirin 81 MG tablet Take 81 mg by mouth daily.   bisoprolol 5 MG tablet Commonly known as:  ZEBETA Take 0.5 tablets (2.5 mg total) by mouth daily. Take at 2pm   clopidogrel 75 MG tablet Commonly known as:  Plavix Take 1 tablet (75 mg total) by mouth daily. Start taking on:  September 06, 2018   colchicine 0.6 MG tablet Take 1 tablet (0.6 mg total) by mouth 2 (two) times daily.   empagliflozin 25 MG Tabs tablet Commonly known as:  JARDIANCE Take 25 mg  by mouth daily.   fenofibrate 48 MG tablet Commonly known as:  Tricor Take 1 tablet (48 mg total) by mouth daily.   HYDROcodone-acetaminophen 5-325 MG tablet Commonly known as:  NORCO/VICODIN Take 1 tablet by mouth 2 (two) times daily as needed for moderate pain.   isosorbide mononitrate 30 MG 24 hr tablet Commonly known as:  IMDUR Take 0.5 tablets (15 mg total) by mouth daily.   lisinopril 5 MG tablet Commonly known as:  PRINIVIL,ZESTRIL Take 1 tablet (5 mg total) by mouth daily.   metFORMIN 1000 MG tablet Commonly known as:  GLUCOPHAGE Take 1 tablet by mouth 2 times daily with a meal.     nitroGLYCERIN 0.4 MG SL tablet Commonly known as:  NITROSTAT Place 1 tablet (0.4 mg total) under the tongue every 5 (five) minutes as needed for chest pain.   omeprazole 20 MG capsule Commonly known as:  PRILOSEC TAKE (1) CAPSULE DAILY What changed:  See the new instructions.   primidone 50 MG tablet Commonly known as:  MYSOLINE Take 1 tablet (50 mg total) by mouth 4 (four) times daily.   rosuvastatin 40 MG tablet Commonly known as:  CRESTOR Take 1 tablet (40 mg total) by mouth daily.   sitaGLIPtin 100 MG tablet Commonly known as:  JANUVIA Take 1 tablet (100 mg total) by mouth daily.   tamsulosin 0.4 MG Caps capsule Commonly known as:  FLOMAX Take 1 capsule (0.4 mg total) by mouth daily.        Acute coronary syndrome (MI, NSTEMI, STEMI, etc) this admission?: Yes.     AHA/ACC Clinical Performance & Quality Measures: 1. Aspirin prescribed? - Yes 2. ADP Receptor Inhibitor (Plavix/Clopidogrel, Brilinta/Ticagrelor or Effient/Prasugrel) prescribed (includes medically managed patients)? - Yes 3. Beta Blocker prescribed? - Yes 4. High Intensity Statin (Lipitor 40-80mg  or Crestor 20-40mg ) prescribed? - Yes 5. EF assessed during THIS hospitalization? - Yes 6. For EF <40%, was ACEI/ARB prescribed? - Not Applicable (EF >/= 46%) 7. For EF <40%, Aldosterone Antagonist (Spironolactone or Eplerenone) prescribed? - Not Applicable (EF >/= 65%) 8. Cardiac Rehab Phase II ordered (Included Medically managed Patients)? - Yes     Outstanding Labs/Studies   None.  Duration of Discharge Encounter   Greater than 30 minutes including physician time.  Signed, Darreld Mclean, PA-C 09/05/2018, 3:37 PM  Patient seen and examined with the above-signed Advanced Practice Provider and/or Housestaff. I personally reviewed laboratory data, imaging studies and relevant notes. I independently examined the patient and formulated the important aspects of the plan. I have edited the note to reflect  any of my changes or salient points. I have personally discussed the plan with the patient and/or family.  He is stable for d/c. Will arrange outpatient f/u and CR referral.   Glori Bickers, MD  6:42 PM

## 2018-09-05 NOTE — Discharge Instructions (Signed)
Medications: - We are going to stop the Brilinta and transition you to Plavix which is cheaper. - We are also going to start you on Colchicine. Your insurance only covers the the brand version of this medication. Your outpatient pharmacy is going to order this medication and think it will be available for you to pick up tomorrow. - Please take all other medications as directed on your discharge paperwork. _______________  Radial Site Care: Refer to this sheet in the next few weeks. These instructions provide you with information on caring for yourself after your procedure. Your caregiver may also give you more specific instructions. Your treatment has been planned according to current medical practices, but problems sometimes occur. Call your caregiver if you have any problems or questions after your procedure. HOME CARE INSTRUCTIONS  You may shower the day after the procedure.Remove the bandage (dressing) and gently wash the site with plain soap and water.Gently pat the site dry.   Do not apply powder or lotion to the site.   Do not submerge the affected site in water for 3 to 5 days.   Inspect the site at least twice daily.   Do not flex or bend the affected arm for 24 hours.   No lifting over 5 pounds (2.3 kg) for 5 days after your procedure.   Do not drive home if you are discharged the same day of the procedure. Have someone else drive you.   You may drive 24 hours after the procedure unless otherwise instructed by your caregiver.  What to expect:  Any bruising will usually fade within 1 to 2 weeks.   Blood that collects in the tissue (hematoma) may be painful to the touch. It should usually decrease in size and tenderness within 1 to 2 weeks.  SEEK IMMEDIATE MEDICAL CARE IF:  You have unusual pain at the radial site.   You have redness, warmth, swelling, or pain at the radial site.   You have drainage (other than a small amount of blood on the dressing).   You have  chills.   You have a fever or persistent symptoms for more than 72 hours.   You have a fever and your symptoms suddenly get worse.   Your arm becomes pale, cool, tingly, or numb.   You have heavy bleeding from the site. Hold pressure on the site.  _______________  Andrew [name] have a virtual (phone vs video) follow-up visit scheduled for 09/13/2018 at 9:00am with Jory Sims, one of Dr. Allison Quarry NPs. Please see more information about virtual visits below:  [place]  Approximately 15-20 minutes prior to your scheduled appointment, you will receive an e-mail directly from one of our staff member's [email or web address] e-mail accounts inviting you to join a WebEx meeting.  Please do not reply to that email - simply join the [employer].  Upon joining, a member of the office staff will speak with you initially through the WebEx platform to confirm medications, vital signs for the day and any symptoms you may be experiencing.  Please have this information available prior to the time of visit start.    CONSENT FOR TELE-HEALTH VISIT - PLEASE RVIEW  I hereby voluntarily request, consent and authorize [facility] and its employed or contracted physicians, physician assistants, nurse practitioners or other licensed health care professionals (the Practitioner), to provide me with telemedicine health care services (the Services") as deemed necessary by the treating Practitioner. I acknowledge and consent to receive the Services by the  Practitioner via telemedicine. I understand that the telemedicine visit will involve communicating with the Practitioner through live audiovisual communication technology and the disclosure of certain medical information by electronic transmission. I acknowledge that I have been given the opportunity to request an in-person assessment or other available alternative prior to the telemedicine visit and am voluntarily participating in the telemedicine visit.  I  understand that I have the right to withhold or withdraw my consent to the use of telemedicine in the course of my care at any time, without affecting my right to future care or treatment, and that the Practitioner or I may terminate the telemedicine visit at any time. I understand that I have the right to inspect all information obtained and/or recorded in the course of the telemedicine visit and may receive copies of available information for a reasonable fee.  I understand that some of the potential risks of receiving the Services via telemedicine include:   Delay or interruption in medical evaluation due to technological equipment failure or disruption;  Information transmitted may not be sufficient (e.g. poor resolution of images) to allow for appropriate medical decision making by the Practitioner; and/or   In rare instances, security protocols could fail, causing a breach of personal health information.  Furthermore, I acknowledge that it is my responsibility to provide information about my medical history, conditions and care that is complete and accurate to the best of my ability. I acknowledge that Practitioner's advice, recommendations, and/or decision may be based on factors not within their control, such as incomplete or inaccurate data provided by me or distortions of diagnostic images or specimens that may result from electronic transmissions. I understand that the practice of medicine is not an exact science and that Practitioner makes no warranties or guarantees regarding treatment outcomes. I acknowledge that I will receive a copy of this consent concurrently upon execution via email to the email address I last provided but may also request a printed copy by calling the office of [place].    I understand that my insurance will be billed for this visit.   I have read or had this consent read to me.  I understand the contents of this consent, which adequately explains the  benefits and risks of the Services being provided via telemedicine.   I have been provided ample opportunity to ask questions regarding this consent and the Services and have had my questions answered to my satisfaction.  I give my informed consent for the services to be provided through the use of telemedicine in my medical care  By participating in this telemedicine visit I agree to the above.

## 2018-09-05 NOTE — Care Management (Addendum)
Colchicine 0.6mg  BID benefits check was sent and is still pending. CM attempted to call patient to discuss Brilinta est copay amount, with no response; patient will receive a free 30-day supply of Brilinta prior to transitioning home. CM will continue to follow.  Midge Minium RN, BSN, NCM-BC, ACM-RN [phone]

## 2018-09-05 NOTE — Progress Notes (Deleted)
Patient and daughter notified of discharge today from hospital.

## 2018-09-05 NOTE — Progress Notes (Signed)
Cardiac Rehab Advisory Cardiac Rehab Phase I is not seeing pts face to face at this time due to Covid 19 restrictions. Ambulation is occurring through nursing, PT, and mobility teams. We will help facilitate that process as needed. We are calling pts in their rooms and discussing education. We will then deliver education materials to pts RN for delivery to pt.   Discussed education with pt on phone. Discussed PTCA, restrictions, smoking cessation, diet, NTG, exercising at home as tolerated (pt has limited exercise ability), and CRPII. Pt voiced understanding. He quit smoking in September for 3 months but started back when he underwent significant stress. Pt is going to try again and we discussed methods for dealing with stress. Will refer to [place] however pt lives very far away. Encouraged him to ambulate in room with staff when possible before d/c.  [phone] Yves Dill CES, ACSM 11:41 AM 09/05/2018

## 2018-09-05 NOTE — Telephone Encounter (Signed)
   TELEPHONE CALL NOTE  This patient has been deemed a candidate for follow-up tele-health visit to limit community exposure during the Covid-19 pandemic. I spoke with the patient via phone to discuss instructions. This has been outlined on the patient's AVS (dotphrase: hcevisitinfo). The patient was advised to review the section on consent for treatment as well. The patient will receive a phone call 2-3 days prior to their E-Visit at which time consent will be verbally confirmed. A Virtual Office Visit appointment type has been scheduled for 09/13/2018 with Jory Sims, with "VIDEO" or "TELEPHONE" in the appointment notes - patient willing to try Video type.  I have either confirmed the patient is active in MyChart or offered to send sign-up link to phone/email via Mychart icon beside patient's photo.  Darreld Mclean, PA-C 09/05/2018 3:42 PM

## 2018-09-05 NOTE — Progress Notes (Signed)
Discharged to home with daughter. All discharge instructions given. Verbalized understand. Questions answered.

## 2018-09-07 ENCOUNTER — Other Ambulatory Visit: Payer: Self-pay | Admitting: *Deleted

## 2018-09-07 MED ORDER — BISOPROLOL FUMARATE 5 MG PO TABS: 2.5000 mg | ORAL_TABLET | Freq: Every day | ORAL | 3 refills | Status: DC

## 2018-09-11 ENCOUNTER — Telehealth: Payer: Self-pay | Admitting: Cardiology

## 2018-09-11 ENCOUNTER — Emergency Department (HOSPITAL_COMMUNITY)
Admission: EM | Admit: 2018-09-11 | Discharge: 2018-09-12 | Disposition: A | Discharge status: Home / Self Care | Payer: Medicare Other | Attending: Emergency Medicine | Admitting: Emergency Medicine

## 2018-09-11 ENCOUNTER — Emergency Department (HOSPITAL_COMMUNITY): Payer: Medicare Other

## 2018-09-11 ENCOUNTER — Other Ambulatory Visit: Payer: Self-pay

## 2018-09-11 ENCOUNTER — Encounter (HOSPITAL_COMMUNITY): Payer: Self-pay | Admitting: Emergency Medicine

## 2018-09-11 ENCOUNTER — Telehealth (HOSPITAL_COMMUNITY): Payer: Self-pay | Admitting: *Deleted

## 2018-09-11 DIAGNOSIS — R079 Chest pain, unspecified: Secondary | ICD-10-CM | POA: Diagnosis not present

## 2018-09-11 DIAGNOSIS — Z7902 Long term (current) use of antithrombotics/antiplatelets: Secondary | ICD-10-CM | POA: Insufficient documentation

## 2018-09-11 DIAGNOSIS — I309 Acute pericarditis, unspecified: Secondary | ICD-10-CM

## 2018-09-11 DIAGNOSIS — R0600 Dyspnea, unspecified: Secondary | ICD-10-CM

## 2018-09-11 DIAGNOSIS — F17210 Nicotine dependence, cigarettes, uncomplicated: Secondary | ICD-10-CM | POA: Diagnosis not present

## 2018-09-11 DIAGNOSIS — E119 Type 2 diabetes mellitus without complications: Secondary | ICD-10-CM | POA: Diagnosis not present

## 2018-09-11 DIAGNOSIS — I10 Essential (primary) hypertension: Secondary | ICD-10-CM | POA: Insufficient documentation

## 2018-09-11 DIAGNOSIS — Z955 Presence of coronary angioplasty implant and graft: Secondary | ICD-10-CM | POA: Insufficient documentation

## 2018-09-11 DIAGNOSIS — Z8551 Personal history of malignant neoplasm of bladder: Secondary | ICD-10-CM | POA: Diagnosis not present

## 2018-09-11 DIAGNOSIS — J449 Chronic obstructive pulmonary disease, unspecified: Secondary | ICD-10-CM | POA: Diagnosis not present

## 2018-09-11 DIAGNOSIS — Z7982 Long term (current) use of aspirin: Secondary | ICD-10-CM | POA: Insufficient documentation

## 2018-09-11 DIAGNOSIS — I2510 Atherosclerotic heart disease of native coronary artery without angina pectoris: Secondary | ICD-10-CM | POA: Diagnosis not present

## 2018-09-11 DIAGNOSIS — R0602 Shortness of breath: Secondary | ICD-10-CM | POA: Diagnosis not present

## 2018-09-11 DIAGNOSIS — R0902 Hypoxemia: Secondary | ICD-10-CM | POA: Diagnosis not present

## 2018-09-11 DIAGNOSIS — I499 Cardiac arrhythmia, unspecified: Secondary | ICD-10-CM | POA: Diagnosis not present

## 2018-09-11 DIAGNOSIS — Z79899 Other long term (current) drug therapy: Secondary | ICD-10-CM | POA: Insufficient documentation

## 2018-09-11 DIAGNOSIS — Z7984 Long term (current) use of oral hypoglycemic drugs: Secondary | ICD-10-CM | POA: Insufficient documentation

## 2018-09-11 DIAGNOSIS — I213 ST elevation (STEMI) myocardial infarction of unspecified site: Secondary | ICD-10-CM | POA: Diagnosis not present

## 2018-09-11 DIAGNOSIS — I959 Hypotension, unspecified: Secondary | ICD-10-CM | POA: Diagnosis not present

## 2018-09-11 LAB — CBC
HCT: 43.9 % (ref 39.0–52.0)
Hemoglobin: 14.1 g/dL (ref 13.0–17.0)
MCH: 29.8 pg (ref 26.0–34.0)
MCHC: 32.1 g/dL (ref 30.0–36.0)
MCV: 92.8 fL (ref 80.0–100.0)
Platelets: 339 10*3/uL (ref 150–400)
RBC: 4.73 MIL/uL (ref 4.22–5.81)
RDW: 12.1 % (ref 11.5–15.5)
WBC: 17.1 10*3/uL — ABNORMAL HIGH (ref 4.0–10.5)
nRBC: 0 % (ref 0.0–0.2)

## 2018-09-11 LAB — BASIC METABOLIC PANEL
Anion gap: 13 (ref 5–15)
BUN: 14 mg/dL (ref 8–23)
CO2: 20 mmol/L — ABNORMAL LOW (ref 22–32)
Calcium: 9.2 mg/dL (ref 8.9–10.3)
Chloride: 101 mmol/L (ref 98–111)
Creatinine, Ser: 0.97 mg/dL (ref 0.61–1.24)
GFR calc Af Amer: >60 mL/min (ref >60)
GFR calc non Af Amer: >60 mL/min (ref >60)
Glucose, Bld: 168 mg/dL — ABNORMAL HIGH (ref 70–99)
Potassium: 4.2 mmol/L (ref 3.5–5.1)
Sodium: 134 mmol/L — ABNORMAL LOW (ref 135–145)

## 2018-09-11 LAB — TROPONIN I
Troponin I: <0.03 ng/mL (ref <0.03)
Troponin I: <0.03 ng/mL (ref <0.03)

## 2018-09-11 MED ORDER — MORPHINE SULFATE (PF) 2 MG/ML IV SOLN
2.0000 mg | Freq: Once | INTRAVENOUS | Status: AC
  Administered 2018-09-11: 2 mg via INTRAVENOUS
  Filled 2018-09-11: qty 1

## 2018-09-11 MED ORDER — HYDROCODONE-ACETAMINOPHEN 5-325 MG PO TABS
1.0000 | ORAL_TABLET | Freq: Once | ORAL | Status: AC
  Administered 2018-09-11: 22:00:00 1 via ORAL
  Filled 2018-09-11: qty 1

## 2018-09-11 MED ORDER — SODIUM CHLORIDE 0.9 % IV BOLUS
500.0000 mL | Freq: Once | INTRAVENOUS | Status: AC
  Administered 2018-09-11: 500 mL via INTRAVENOUS

## 2018-09-11 MED ORDER — SODIUM CHLORIDE 0.9% FLUSH: 3.0000 mL | Freq: Once | INTRAVENOUS | Status: DC

## 2018-09-11 NOTE — Telephone Encounter (Signed)
New Message          Pt c/o Shortness Of Breath: STAT if SOB developed within the last 24 hours or pt is noticeably SOB on the phone  1. Are you currently SOB (can you hear that pt is SOB on the phone)? Yes  2. How long have you been experiencing SOB? 2 wks now its even more so  3. Are you SOB when sitting or when up moving around?Sitting   4.  Are you currently experiencing any other symptoms? Heaviness feeling in the chest with sharp pains.

## 2018-09-11 NOTE — Telephone Encounter (Signed)
Spoke with patient and he has been having intermittent chest pain and shortness of breath since being d/c from hospital last week. Today pain and shortness of breath has been constant, pain 9 out of 10 with 10 being worse pain. Advised patient he needed to call 911.. Patient also having productive cough at times with yellow sputum.  Patient reluctant to call 911 but advised if he is currently having chest pain that bad associated with shortness of breath to call 911. Patient verbalized understanding

## 2018-09-11 NOTE — Telephone Encounter (Signed)
Discussed with Dr Ellyn Hack and he agreed with patient going to ED for evaluation. Left message to call back at both patient and daughters number.

## 2018-09-11 NOTE — ED Triage Notes (Signed)
Patient presents to the ED by EMS with c/o with chest discomfort today with weakness today. Hx of cardiac stent and balloon takes Pavix. Recent MI last week. Pt hypotensive with EMS 98/62 Pulse 95-100. 4 ASA and 2 Nitro PTA. A/o x4

## 2018-09-11 NOTE — ED Provider Notes (Addendum)
[facility] EMERGENCY DEPARTMENT Provider Note   CSN: [id number] Arrival date & time: 09/11/18  1807    History   Chief Complaint Chief Complaint  Patient presents with   Chest Pain   Weakness    HPI Andrew [name] is a 68 y.o. male who presents with chest pain and SOB. PMH significant for CAD with multivessel disease, hx of recent STEMI and viral pericarditis, HTN, HLD, DM, hx of bladder cancer in remission. He has a complicated history. He was originally seen on 4/12 for severe, sharp chest pain which had been going on for about a week. It originally was on the left side and intermittent in nature. It is associated with SOB. It is sharp and radiates to the right side. It felt like prior MI. Pain is worse with inspiration. He reports associated coughing but this is not severe. He had a CT chest/abd/pelvis which was negative for dissection and then he was taken to the cath lab because repeat EKG showed inferolateral STEMI and had a balloon angioplasty done since there are multiple lesions that are not amenable to PCI. CT did note a small pleural effusion. An echo was done which showed a normal EF and mild-moderate effusion. He was started on Cochicine for pericarditis and has been taking this but states that it hasn't been providing relief. He continues to have intermittent CP and SOB daily. It was worse today which prompted him to come to the ED. He called the cardiology clinic and they recommended him to come in. He really feels like he has an infection in his chest because it feels similar as to when he had to have what sounds like a chest tube for a lung infection several years ago. He was negative for COVID on last admission. He has a f/u with Cards on 4/22. He states that his originally STEMI he was told that his EKG and Troponins were normal as well. He denies fever, syncope, abdominal pain, N/V, leg swelling.  Primary cardiologist: Dr. Ellyn Hack    HPI  Past Medical  History:  Diagnosis Date   Allergy    Anemia    Arthritis    Cancer ([place])    Bladder   Depression    Diabetes mellitus without complication ([place])    GERD (gastroesophageal reflux disease)    Hyperlipidemia    Hypertension    in the past no longer on medication   Neuromuscular disorder ([place])    Sleep apnea    BiPAP no used in 3 years   Status post tendon repair 1989   STEMI (ST elevation myocardial infarction) ([place]) 01/20/2018   Cardiac cath January 21, 2018: Severe multivessel disease with unclear lesion, but opted for PCI/DES x1 to the RI.  Appeared to have CTO of the distal LAD and circumflex as well as moderate mid LAD and diagonal disease as well as PDA.Marland Kitchen  Unable to cross circumflex lesion.   Stroke [facility])    At times pt has dizziness with loss of vision   Tremors of nervous system 2011    Patient Active Problem List   Diagnosis Date Noted   Pericarditis 09/05/2018   Acute ST elevation myocardial infarction (STEMI) of inferolateral wall ([place]) 09/05/2018   Chest pain 09/04/2018   Unstable angina ([place]) 09/04/2018   Presence of drug coated stent in ramus intermedius coronary artery 05/12/2018   COPD with acute exacerbation ([place]) 01/21/2018   Acute non-ST-segment elevation myocardial infarction [facility]) 01/21/2018   CAD 01/21/2018  Stable angina ([place]) 01/20/2018   Depression 01/20/2018   Essential hypertension 01/20/2018   Sleep apnea 10/06/2016   Generalized edema 01/15/2016   Tobacco use disorder 12/19/2015   Type 2 diabetes mellitus ([place]) 12/19/2015   Hyperlipidemia 12/19/2015   Bladder cancer ([place]) 12/19/2015   Esophageal reflux 12/19/2015   Essential tremor 12/19/2015   History of stroke 12/19/2015    Past Surgical History:  Procedure Laterality Date   APPENDECTOMY     BLADDER SURGERY     COLONOSCOPY     CORONARY STENT INTERVENTION N/A 01/21/2018   Procedure: CORONARY STENT INTERVENTION;  Surgeon: Leonie Man, MD;   Location: [place] [facility] CV LAB;  Service: Cardiovascular;  Laterality: N/A;  95% Ramus Intermedius -PCI with synergy DES 2.25 mm x 12 mm postdilated 2.4 mm.   CORONARY/GRAFT ACUTE MI REVASCULARIZATION N/A 01/21/2018   Procedure: Coronary/Graft Acute MI Revascularization;  Surgeon: Leonie Man, MD;  Location: [facility] CV LAB;  Service: Cardiovascular;  Laterality: N/A;  attempted revas to distal CFX;    CORONARY/GRAFT ACUTE MI REVASCULARIZATION N/A 09/04/2018   Procedure: Coronary/Graft Acute MI Revascularization;  Surgeon: Sherren Mocha, MD;  Location: [facility] CV LAB;  Service: Cardiovascular;  Laterality: N/A;   CYSTOSCOPY W/ RETROGRADES Bilateral 08/22/2015   Procedure: CYSTOSCOPY WITH RETROGRADE PYELOGRAM;  Surgeon: Irine Seal, MD;  Location: [facility];  Service: Urology;  Laterality: Bilateral;   CYSTOSCOPY W/ RETROGRADES Bilateral 01/16/2016   Procedure: CYSTOSCOPY WITH RETROGRADE PYELOGRAM;  Surgeon: Irine Seal, MD;  Location: [facility];  Service: Urology;  Laterality: Bilateral;   CYSTOSCOPY W/ RETROGRADES Bilateral 01/07/2017   Procedure: CYSTOSCOPY WITH RETROGRADE PYELOGRAM;  Surgeon: Irine Seal, MD;  Location: [facility];  Service: Urology;  Laterality: Bilateral;   CYSTOSCOPY WITH BIOPSY N/A 08/22/2015   Procedure: CYSTOSCOPY WITH BLADDER BIOPSY;  Surgeon: Irine Seal, MD;  Location: [facility];  Service: Urology;  Laterality: N/A;   CYSTOSCOPY WITH BIOPSY N/A 01/16/2016   Procedure: CYSTOSCOPY WITH BLADDER BIOPSY;  Surgeon: Irine Seal, MD;  Location: [facility];  Service: Urology;  Laterality: N/A;   CYSTOSCOPY WITH FULGERATION N/A 01/16/2016   Procedure: CYSTOSCOPY WITH FULGERATION;  Surgeon: Irine Seal, MD;  Location: [facility];  Service: Urology;  Laterality: N/A;   KNEE ARTHROSCOPY Right 1989   LEFT HEART CATH AND CORONARY ANGIOGRAPHY N/A 01/21/2018   Procedure: LEFT HEART CATH AND CORONARY ANGIOGRAPHY;  Surgeon: Leonie Man, MD;  Location: [facility] CV LAB;  Service:  Cardiovascular;; 95% RI-DES PCI.  80% o-pCX (PTCA) -> dCX 80%-100% CTO (unsuccessful PTCA unable to cross distal lesion with balloon.)  100% CTO dLAD. RPDA 80% and 70% as well as P AV 180%, PA V2 60%.  (too small for PCI)   LEFT HEART CATH AND CORONARY ANGIOGRAPHY N/A 09/04/2018   Procedure: LEFT HEART CATH AND CORONARY ANGIOGRAPHY;  Surgeon: Sherren Mocha, MD;  Location: [facility] LAB;  Service: Cardiovascular;  Laterality: N/A;   ROTATOR CUFF REPAIR Bilateral 2000   rt. leg fracture surgery     TRANSTHORACIC ECHOCARDIOGRAM  01/21/2018   Mild LVH.  EF 50 and 55%.  Apical inferior hypokinesis.  Mid-apical anterolateral hypokinesis.  Normal diastolic function for age    TRANSURETHRAL RESECTION OF BLADDER TUMOR N/A 01/07/2017   Procedure: TRANSURETHRAL RESECTION OF BLADDER TUMOR (TURBT);  Surgeon: Irine Seal, MD;  Location: [facility];  Service: Urology;  Laterality: N/A;   WISDOM TOOTH EXTRACTION          Home Medications    Prior to  Admission medications   Medication Sig Start Date End Date Taking? Authorizing Provider  aspirin 81 MG tablet Take 81 mg by mouth daily.    [provider]  bisoprolol (ZEBETA) 5 MG tablet Take 0.5 tablets (2.5 mg total) by mouth daily. Take at 2pm 09/07/18   Dettinger, Fransisca Kaufmann, MD  clopidogrel (PLAVIX) 75 MG tablet Take 1 tablet (75 mg total) by mouth daily. 09/06/18 09/06/19  Sande Rives E, PA-C  colchicine 0.6 MG tablet Take 1 tablet (0.6 mg total) by mouth 2 (two) times daily. 09/05/18   Darreld Mclean, PA-C  empagliflozin (JARDIANCE) 25 MG TABS tablet Take 25 mg by mouth daily. 02/22/18   Eustaquio Maize, MD  fenofibrate (TRICOR) 48 MG tablet Take 1 tablet (48 mg total) by mouth daily. 08/08/18   Duke, Tami Lin, PA  HYDROcodone-acetaminophen (NORCO/VICODIN) 5-325 MG tablet Take 1 tablet by mouth 2 (two) times daily as needed for moderate pain. 07/10/18   Dettinger, Fransisca Kaufmann, MD  isosorbide mononitrate (IMDUR) 30 MG 24 hr tablet  Take 0.5 tablets (15 mg total) by mouth daily. 07/10/18 09/04/18  Dettinger, Fransisca Kaufmann, MD  lisinopril (PRINIVIL,ZESTRIL) 5 MG tablet Take 1 tablet (5 mg total) by mouth daily. 04/10/18   Eustaquio Maize, MD  metFORMIN (GLUCOPHAGE) 1000 MG tablet Take 1 tablet by mouth 2 times daily with a meal. 07/10/18   Dettinger, Fransisca Kaufmann, MD  nitroGLYCERIN (NITROSTAT) 0.4 MG SL tablet Place 1 tablet (0.4 mg total) under the tongue every 5 (five) minutes as needed for chest pain. 09/05/18   Darreld Mclean, PA-C  omeprazole (PRILOSEC) 20 MG capsule TAKE (1) CAPSULE DAILY Patient taking differently: Take 20 mg by mouth daily.  08/09/18   Dettinger, Fransisca Kaufmann, MD  primidone (MYSOLINE) 50 MG tablet Take 1 tablet (50 mg total) by mouth 4 (four) times daily. 01/06/18   Eustaquio Maize, MD  rosuvastatin (CRESTOR) 40 MG tablet Take 1 tablet (40 mg total) by mouth daily. 08/01/18 10/30/18  Leonie Man, MD  sitaGLIPtin (JANUVIA) 100 MG tablet Take 1 tablet (100 mg total) by mouth daily. 01/06/18   Eustaquio Maize, MD  tamsulosin (FLOMAX) 0.4 MG CAPS capsule Take 1 capsule (0.4 mg total) by mouth daily. 04/10/18   Eustaquio Maize, MD    Family History Family History  Problem Relation Age of Onset   Diabetes Mother 11       type 1   Stroke Mother    Dementia Father    Diabetes Brother    Heart attack Brother 69   Testicular cancer Brother    Diabetes Brother    Cancer Brother        testicular   Colon cancer Neg Hx    Colon polyps Neg Hx    Esophageal cancer Neg Hx    Rectal cancer Neg Hx    Stomach cancer Neg Hx     Social History Social History   Tobacco Use   Smoking status: Current Every Day Smoker    Packs/day: 0.50    Years: 42.00    Pack years: 21.00    Types: Cigarettes   Smokeless tobacco: Never Used  Substance Use Topics   Alcohol use: No    Comment: rarely   Drug use: No     Allergies   Tramadol and Trulicity [dulaglutide]   Review of Systems Review of  Systems  Constitutional: Positive for fatigue. Negative for chills and fever.  Respiratory: Positive for cough and shortness  of breath.   Cardiovascular: Positive for chest pain. Negative for palpitations and leg swelling.  Gastrointestinal: Negative for abdominal pain and nausea.  Neurological: Negative for syncope.  Psychiatric/Behavioral: Positive for sleep disturbance.  All other systems reviewed and are negative.    Physical Exam Updated Vital Signs BP 111/67    Pulse 94    Temp 99.2 F (37.3 C) (Oral)    Resp (!) 24    Ht 6\' 3"  (1.905 m)    Wt 92.9 kg    SpO2 95%    BMI 25.60 kg/m   Physical Exam Vitals signs and nursing note reviewed.  Constitutional:      General: He is not in acute distress.    Appearance: Normal appearance. He is well-developed. He is not ill-appearing.     Comments: Calm, cooperative. Fatigued  HENT:     Head: Normocephalic and atraumatic.  Eyes:     General: No scleral icterus.       Right eye: No discharge.        Left eye: No discharge.     Conjunctiva/sclera: Conjunctivae normal.     Pupils: Pupils are equal, round, and reactive to light.  Neck:     Musculoskeletal: Normal range of motion.  Cardiovascular:     Rate and Rhythm: Normal rate and regular rhythm.  Pulmonary:     Effort: Pulmonary effort is normal. No respiratory distress.     Breath sounds: Normal breath sounds.  Abdominal:     General: There is no distension.     Palpations: Abdomen is soft.     Tenderness: There is no abdominal tenderness.  Skin:    General: Skin is warm and dry.  Neurological:     Mental Status: He is alert and oriented to person, place, and time.  Psychiatric:        Behavior: Behavior normal.      ED Treatments / Results  Labs (all labs ordered are listed, but only abnormal results are displayed) Labs Reviewed  BASIC METABOLIC PANEL - Abnormal; Notable for the following components:      Result Value   Sodium 134 (*)    CO2 20 (*)    Glucose,  Bld 168 (*)    All other components within normal limits  CBC - Abnormal; Notable for the following components:   WBC 17.1 (*)    All other components within normal limits  TROPONIN I  TROPONIN I    EKG EKG Interpretation  Date/Time:  Monday September 11 2018 18:10:45 EDT Ventricular Rate:  97 PR Interval:    QRS Duration: 95 QT Interval:  333 QTC Calculation: 423 R Axis:   55 Text Interpretation:  Sinus rhythm Atrial premature complex Probable left atrial enlargement Anteroseptal infarct, old ST elevation, consider inferior injury Confirmed by Gerlene Fee [phone]) on 09/11/2018 8:33:32 PM   Radiology Dg Chest Portable 1 View  Result Date: 09/11/2018 CLINICAL DATA:  Chest pain and shortness of breath EXAM: PORTABLE CHEST 1 VIEW COMPARISON:  09/05/2018, 09/03/2018 FINDINGS: The heart size and mediastinal contours are within normal limits. Both lungs are clear. The visualized skeletal structures are unremarkable. IMPRESSION: Unchanged examination without acute cardiopulmonary process. Electronically Signed   By: Ulyses Jarred M.D.   On: 09/11/2018 19:24    Procedures Procedures (including critical care time)  Medications Ordered in ED Medications  sodium chloride flush (NS) 0.9 % injection 3 mL (3 mLs Intravenous Not Given 09/11/18 1911)  sodium chloride 0.9 % bolus 500 mL (  500 mLs Intravenous New Bag/Given 09/11/18 1942)  HYDROcodone-acetaminophen (NORCO/VICODIN) 5-325 MG per tablet 1 tablet (1 tablet Oral Given 09/11/18 2153)     Initial Impression / Assessment and Plan / ED Course  I have reviewed the triage vital signs and the nursing notes.  Pertinent labs & imaging results that were available during my care of the patient were reviewed by me and considered in my medical decision making (see chart for details).  68 year old male presents with CP and SOB. He was recently admitted and was diagnosed with acute viral pericarditis and underwent cardiac cath and had balloon  angioplasty. His symptoms have been ongoing since his last hospital stay and he is frustrated it is not improving. He states it has not improved with Colchicine. BP is soft but otherwise vitals are normal. Exam is overall unremarkable. Will obtain CP work up with EKG, CXR, labs. He has had an extensive work up last week which involved CTA of chest/abdomen/pelvis which was negative for dissection and PE. He also had an updated echo.  EKG is SR and appears improved from prior. CXR is negative. CBC is remarkable for leukocytosis of 17. BMP is remarkable for hyperglycemia (168). 1st trop is 0. Discussed with Cardiology. They do not feel pain is ACS related but sounds more pleuritic. Pain is probably due to pericarditis. They do not recommend NSAIDs or Prednisone at this time since he is already on dual antiplatelet therapy and is diabetic. Will try Norco. Shared visit with Dr. Sedonia Small.  2nd trop is 0. Rechecked pt. He still feels like pain is severe although several times when I was in the in room he was sleeping, talking on the phone with family, or asking to eat. He is reluctant to go home without pain controlled however he seems too comfortable to keep in the hospital so he will be discharged. He was not very happy with this but understands. Rx for pain medicine was sent to his pharmacy. He was encouraged to f/u with cards.  Final Clinical Impressions(s) / ED Diagnoses   Final diagnoses:  Acute pericarditis, unspecified type  Chest pain, unspecified type  Dyspnea, unspecified type    ED Discharge Orders    None       Recardo Evangelist, PA-C 09/12/18 0023    Recardo Evangelist, PA-C 09/12/18 1628    Maudie Flakes, MD 09/12/18 [phone]

## 2018-09-11 NOTE — ED Triage Notes (Signed)
EMS reports patient was tested for Covid-19 last week-he was negative.

## 2018-09-11 NOTE — ED Notes (Signed)
Daughter Guardian and HPOA in conversation with this RN. [place] Crane. She was given an update on patients.  [phone].

## 2018-09-11 NOTE — Telephone Encounter (Signed)
F/U Message           Patient's daughter is calling back [phone]. She is upset because her father couldn't talk with a doctor. Is there anyway the doc of the day can speak with the patient.  He keeps complaining of the SOB and heaviness in his chest. Pls call to advise.

## 2018-09-12 ENCOUNTER — Telehealth: Payer: Self-pay | Admitting: *Deleted

## 2018-09-12 ENCOUNTER — Encounter: Payer: Self-pay | Admitting: Cardiology

## 2018-09-12 ENCOUNTER — Telehealth (INDEPENDENT_AMBULATORY_CARE_PROVIDER_SITE_OTHER): Payer: Medicare Other | Admitting: Cardiology

## 2018-09-12 VITALS — Ht 75.0 in | Wt 200.0 lb

## 2018-09-12 DIAGNOSIS — I25119 Atherosclerotic heart disease of native coronary artery with unspecified angina pectoris: Secondary | ICD-10-CM

## 2018-09-12 DIAGNOSIS — I300 Acute nonspecific idiopathic pericarditis: Secondary | ICD-10-CM

## 2018-09-12 DIAGNOSIS — E1159 Type 2 diabetes mellitus with other circulatory complications: Secondary | ICD-10-CM

## 2018-09-12 DIAGNOSIS — I2119 ST elevation (STEMI) myocardial infarction involving other coronary artery of inferior wall: Secondary | ICD-10-CM

## 2018-09-12 DIAGNOSIS — I208 Other forms of angina pectoris: Secondary | ICD-10-CM

## 2018-09-12 DIAGNOSIS — I2089 Other forms of angina pectoris: Secondary | ICD-10-CM

## 2018-09-12 DIAGNOSIS — I301 Infective pericarditis: Secondary | ICD-10-CM

## 2018-09-12 MED ORDER — ISOSORBIDE MONONITRATE ER 30 MG PO TB24: 30.0000 mg | ORAL_TABLET | Freq: Every day | ORAL | 6 refills | Status: DC

## 2018-09-12 MED ORDER — PREDNISONE 10 MG PO TABS: ORAL_TABLET | ORAL | 0 refills | Status: AC

## 2018-09-12 MED ORDER — OXYCODONE-ACETAMINOPHEN 5-325 MG PO TABS: 2.0000 | ORAL_TABLET | ORAL | 0 refills | Status: DC | PRN

## 2018-09-12 NOTE — Telephone Encounter (Signed)
Spoke with pt dtr, patient is having a consistent pain in his chest that will go from mild to severe and sharpe. When the discomfort gets sharpe he will loose his breath. Lying down will make the discomfort worse. They are aware the discomfort is probably due to his pericarditis but they were never given that diagnosis when he originally left the hospital. They do not know what it is or what to expect. His dtr is very angry and upset that he was sent home with continued pain. She is also upset about [place] sending him to the hospital every time he calls with pain. Patient was given a telephone visit today with dr harding and she wants me to send a message to him to find out what they are supposed to do before 2:20 pm today. She was instructed to give the patient his percocet. Will forward to dr harding to review and advise.

## 2018-09-12 NOTE — Assessment & Plan Note (Signed)
Presumably viral pericarditis although it more likely idiopathic.  The fact that he had some coughing and some potential URI symptoms are negative more consistent with viral.  There is a pericardial effusion on echocardiogram which would go along with it.  Unfortunately I was not able to examine him to see if there was a rub and could not tell if the rounding cardiologist was able to note a rub either.  Thankfully, he was tested for COVID-19.  The fact that he clinically is improving without significant dyspnea would argue that he probably was COVID 19 negative.  I went in great detail explained to him the pathophysiology of pericarditis.  This included explained to the patient his daughter and son-in-law.  I explained to this type of pain will probably last off and on for at least another 6 weeks.  The plan is been to be using colchicine as the major to anti-inflammatory.  I did add a prednisone taper for the next 12 days to hopefully help this out.  But I do want him to continue to use the narcotic (his Vicodin first option and Percocet second option) for breakthrough pain hopefully may be after after a few days of using Percocet he could then downgrade to Vicodin as the pain improves.  Thankfully, his chest x-ray did not show any signs of active pneumonia, and he did still do not have positive troponin levels.

## 2018-09-12 NOTE — Telephone Encounter (Addendum)
° ° °  Daughter calling, states patient s still having chest pain right now. Went to the ED yesterday  1. Are you having CP right now? YES per daughter  2. Are you experiencing any other symptoms (ex. SOB, nausea, vomiting, sweating)? SOB  3. How long have you been experiencing CP?   4. Is your CP continuous or coming and going? COMING AND GOING  5. Have you taken Nitroglycerin? NO ?

## 2018-09-12 NOTE — Telephone Encounter (Signed)
Patient went to the ER yesterday.

## 2018-09-12 NOTE — Assessment & Plan Note (Signed)
Actually, physiologically this probably was not truly a STEMI given lack of troponin elevation.  He did undergo PTCA only of the PDA which is probably the only potential culprit lesion.  Relatively small caliber vessel.  With his persistent chest pain my plan would be to increase his Imdur just in case there is an anginal component.  I do not think that DES PCI was a good option of the PDA and therefore would not relook cath given the negative troponins at the time and negative troponin during his last visit.  Low likelihood that this is occluded.

## 2018-09-12 NOTE — Patient Instructions (Addendum)
  Patient instructions:  The actual true appropriate final diagnosis for your hospital stay was acute viral pericarditis which is inflammation of the sac around the heart.  It was not a heart attack (acute myocardial infarction) because the blood work did not show that. As we discussed, this sharp pain that you are feeling is something that is well-documented with pericarditis and can last up to a month/6 weeks after the initial onset.  The plan is to treat with anti-inflammatory medication such as colchicine.  Usually we would also use Aleve or ibuprofen in conjunction with colchicine, but with you just having had angioplasty and being on aspirin and Plavix, we are reluctant to do that because it will increase risk of bleeding.  The recommendation is to use the narcotics such as Vicodin (hydrocodone) or Percocet (oxycodone) for breakthrough pain.  I am now adding a prednisone taper to help hopefully help as another option for anti-inflammatory treatment.  When you are on prednisone, you will need to closely follow your blood sugar levels, Because they may go high.   Medication Instructions:    Prednisone taper: 60 mg daily for 3 days, 40 mg daily for 3 days, 20 mg daily for 3 days, 10 mg daily for 3 days then stop.   I am also increasing Imdur dose (isosorbide mononitrate) to 30 mg daily.  As we discussed on the phone: The colchicine is the primary medicine for treating your diagnosis of pericarditis.  Continue taking colchicine as directed with the plan to be to take it for at least a month if not 2 months.  Continue to use Vicodin (as needed every 4-6 hours) as the first choice and then Percocet (if used, would be every 6-8 hours) as a second choice for breakthrough pain.    If you need a refill on your cardiac medications before your next appointment, please call your pharmacy.   Lab work: No labs needed.    Testing/Procedures: No  Test needed   Follow-Up: At [facility], you  and your health needs are our priority.  As part of our continuing mission to provide you with exceptional heart care, we have created designated Provider Care Teams.  These Care Teams include your primary Cardiologist (physician) and Advanced Practice Providers (APPs -  Physician Assistants and Nurse Practitioners) who all work together to provide you with the care you need, when you need it. Marland Kitchen Keep appointment for next week - April 29,2020    Any Other Special Instructions Will Be Listed Below . Please send a letter to [facility] --care of both the emergency room and the hospital for that you are on explaining your complete concerns about initially the hospitalization discharge issues with explaining medications, and the emergency room issues about feeling like you are "dumped out on the street".  We need to know this important information in your concerns in order to improve the quality of patient care.  Unfortunately we cannot make out for what happened to you, but we can prevent having to somebody else.  I apologize for the inconvenience and you being upset about these issues.

## 2018-09-12 NOTE — Discharge Instructions (Addendum)
Take Percocet for pain every 4-6 hours as needed Continue Colchicine for pericarditis Please call your cardiologist's office tomorrow Return if worsening

## 2018-09-12 NOTE — Assessment & Plan Note (Signed)
Still has pretty significant multivessel disease with an occluded circumflex and LAD.  Relatively sizable prior infarct in the inferolateral wall.  EKG on arrival to the emergency room on 12 April suggested inferior STEMI, so PTCA of the PDA was performed.  This really did not make a difference to his chest pain and Dr. Burt Knack felt that it probably was not the main etiology.  In fact this was confirmed by negative troponin levels indicating that the diagnosis on discharge should not have been Acute Inferior ST Elevation MI.  He is on aspirin Plavix having been converted from [place] because of breathing issues.  I do agree with being overly concerned about using a long-term NSAID taper, therefore am switching to additional prednisone taper for the acute pericarditis symptoms.  He is on a statin plus fenofibrate, and his labs been well controlled.  His blood pressure and heart rate been well controlled on bisoprolol and lisinopril.  He will continue aspirin plus Plavix for at least 6 months at which time I probably will stop aspirin and continue Plavix pretty much long-term.    After 6 months (October), however Plavix could be stopped if necessary for procedures.

## 2018-09-12 NOTE — Assessment & Plan Note (Signed)
Simply because he does multivessel disease and has had some symptoms of a been anginal in nature, I would increase his Imdur to 30 mg daily for a little bit more antianginal benefit.  He is also on bisoprolol.

## 2018-09-12 NOTE — Progress Notes (Signed)
Virtual Visit via Telephone Note   This visit type was conducted due to national recommendations for restrictions regarding the COVID-19 Pandemic (e.g. social distancing) in an effort to limit this patient's exposure and mitigate transmission in our community.  Due to his co-morbid illnesses, this patient is at least at moderate risk for complications without adequate follow up.  This format is felt to be most appropriate for this patient at this time.  The patient did not have access to video technology/had technical difficulties with video requiring transitioning to audio format only (telephone).  All issues noted in this document were discussed and addressed.  No physical exam could be performed with this format.  Please refer to the patient's chart for his  consent to telehealth for [facility].   The patient and family refused video call.  Patient has given verbal permission to conduct this visit via virtual appointment and to bill insurance 09/12/2018 5:39 PM     Evaluation Performed:  Follow-up visit  Date:  09/12/2018   ID:  Andrew [name], DOB 09-21-1950, MRN [id number]  Patient Location: Home Provider Location: Home  PCP:  Dettinger, Fransisca Kaufmann, MD  Cardiologist:  Glenetta Hew, MD  Electrophysiologist:  None   Chief Complaint:  Chest Pain - Hospital f/u ([place]-Pericarditis), recent PTCA of RPDA.   History of Present Illness:    Bruk Tumolo is a 68 y.o. male with PMH notable for multivessel CAD (PCI of ramus intermedius and then recent PTCA of PDA), longstanding smoker with COPD, hypertension, hyperlipidemia, OSA on BiPAP, DM 2 on insulin, and prior CVA who presents via audio/video conferencing for a telehealth visit today for a post hospital follow-up/post ER follow-up and complaints of chest pain.  He had likely stuttering symptoms of STEMI in August 2019 -->  He was brought for urgent catheterization on January 21, 2018 revealing significant CAD including distal LAD  and circumflex occlusion.  Both appeared to be chronic as I was unable to truly open up the circumflex. Therefore stented the ramus intermedius.   Chau Sawin was last seen on August 02, 2018 --he is in very much more accepting of his condition, less tearful about having an MI.  Just ended knowledge that he was not able to do what he is able to do 15 years ago.  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  Notes that his knees hurt him when he is out doing activity for long time.  Rarely has episodes of chest pain off and on if he overexerts but not with routine exertion.  No significant exertional dyspnea unless he overdoes her exerts.  No PND orthopnea.  Interval History:   Jahkai was admitted to the hospital April 12-14 with chest pain and shortness of breath --> symptoms have been ongoing for about a week prior to presentation.  Was midsternal and epigastric.  Not improved with nitroglycerin.  EKG suggesting possible acute inferior changes for ST elevation so he was brought to the cardiac catheterization lab and underwent PTCA of the PDA as the only potential culprit.  Despite this he had negative troponins during the entire hospitalization.  Echo showed EF of 45 to 50% with severe anterolateral inferolateral hypokinesis and small pericardial effusion.  --Symptoms were thought to be consistent with viral pleuritis/pericarditis as he had elevated CRP and sed rates.  He was ruled out for COVID-19.  But because of concerns for recent PTCA being on dual antiplatelets he was simply treated with colchicine and  Vicodin.  He contacted the office yesterday complaining of persistent chest pain as well as cough/congestion and was referred to go back to the emergency room.  He was evaluated and ruled out for MI with troponins and EKG.  Ruled out for significant pneumonia with chest x-ray, and was discharged home.  Was continued on colchicine and narcotics, the plan was to not use NSAIDs given his recent  PTCA.  He was discharged on the hospital, and the daughter was very upset that he was not seen by his doctor.  She insisted that he was seen despite having just been discharged from the ER, so he was therefore placed on my schedule today.  I am seeing Lucius today via telehealth visit -via telephone call because they refused video call today.  Lavi is still having pain, but he does note that he is much improved from what was yesterday.  He is very upset about the fact that when he was discharged from the hospital after his initial index hospitalization, there was no explanation according to him and his daughter that he received about his diagnosis being pericarditis.  The discharge paperwork does say acute myocardial infarction and not pericarditis as a diagnosis.  Therefore he did not understand the true nature and etiology of the condition and that the treatment with colchicine was primary with Vicodin being for breakthrough pain.  When the nurse explained to him his discharge medications this was not indicated to him.  Daughter was concerned because she was not contacted about the medication reconciliation.  They are also very concerned about the presentation to emergency room last night.  They called 911, &[facility] arrived.  They did an EKG that was not consistent with STEMI and therefore they were told that they can only transfer him to any pain.  When he in the past family were insistent upon going to [facility], they were told that he would have to drive across the county lines to be transported by [facility] to [facility].  They therefore drove him 20 minutes across the line and met EMS at a shopping center.    In ER, he was treated with pain medications have blood work drawn, but did not understand what diagnosis was being made.  He did have a chest x-ray, but all of a sudden remembers being given the medication through his IV and then being put out to the waiting area for his  family to come pick him up.  Of course this was delayed because they were unable to get a hold of his family initially --because they had gone to bed with the expectation that he would be admitted.  It was roughly 2 with the morning when they went to pick him up.  Today he says he is feeling better, but is still having pain when he lies down or takes a deep breath.  Even talking for prolonged episode will cause him to have pain.  He now understands to use the Percocet for pain which she has done and that has helped.  But he did not know that before. He is not having as much of a cough issue now that he was having before, still has some mild cough but not very productive and much less.  No fever or chills.  No PND, orthopnea or edema.  No rapid irregular heartbeats palpitations.  His chest pain does appear to be worse with inspiration and certain movements, is not consistent with angina.  The  patient does have symptoms concerning for COVID-19 infection (fever, chills, cough, or new shortness of breath).  However he has been evaluated for COVID-19 and his recent hospitalization was negative.  No new symptoms since that time. The patient is practicing social distancing.  ROS:  Please see the history of present illness.    ROS  Past Medical History:  Diagnosis Date   Acute viral pericarditis 09/03/2018   Inferior STE - but Negative Troponin.  CP &SOB.  Felt to be Viral.   Anemia    Arthritis    Bladder cancer ([place])    Depression    Diabetes mellitus without complication ([place])    Essential hypertension 01/20/2018   in the past no longer on medication   GERD (gastroesophageal reflux disease)    Hyperlipidemia with target LDL less than 70 12/19/2015   Multivessel CAD - CTO dLAD, mCx. DES PCI RI, PTCA of RPDA 01/21/2018   12/2017 - Cath for ? STEMI -> distal/apical LAD & m-dCx CTO (unable to cross Cx).  Mod rPDA. Severe RI - DES PCI.  08/2018 - Cath for ? Inf STEMI - RI stent patent & CTO  dLAD/mCx. Progression of rPDA 95% -> PTCA only.  Thought to be Pericarditis & not MI (troponin negative).    Neuromuscular disorder ([place])    Sleep apnea    BiPAP not currently being used   Status post tendon repair 1989   STEMI (ST elevation myocardial infarction) ([place]) 01/20/2018   Cardiac cath January 21, 2018: Severe multivessel disease with unclear lesion, but opted for PCI/DES x1 to the RI.  Appeared to have CTO of the distal LAD and circumflex as well as moderate mid LAD and diagonal disease as well as PDA.Marland Kitchen  Unable to cross circumflex lesion.   Stroke [facility])    At times pt has dizziness with loss of vision   Tremors of nervous system 2011   Past Surgical History:  Procedure Laterality Date   APPENDECTOMY     BLADDER SURGERY     COLONOSCOPY     CORONARY STENT INTERVENTION N/A 01/21/2018   Procedure: CORONARY STENT INTERVENTION;  Surgeon: Leonie Man, MD;  Location: [place] [facility];  Service: Cardiovascular;  Laterality: N/A;  95% Ramus Intermedius -PCI with synergy DES 2.25 mm x 12 mm postdilated 2.4 mm.   CORONARY/GRAFT ACUTE MI REVASCULARIZATION N/A 01/21/2018   Procedure: Coronary/Graft Acute MI Revascularization;  Surgeon: Leonie Man, MD;  Location: [place] [facility];  Service: Cardiovascular;  Laterality: N/A;  attempted revas to distal CFX;    CORONARY/GRAFT ACUTE MI REVASCULARIZATION N/A 09/04/2018   Procedure: Coronary/Graft Acute MI Revascularization;  Surgeon: Sherren Mocha, MD;  Location: [place] CV LAB:: PTCA/POBA of rPDA (2.0 mm balloon)   CYSTOSCOPY W/ RETROGRADES Bilateral 08/22/2015   Procedure: CYSTOSCOPY WITH RETROGRADE PYELOGRAM;  Surgeon: Irine Seal, MD;  Location: [facility];  Service: Urology;  Laterality: Bilateral;   CYSTOSCOPY W/ RETROGRADES Bilateral 01/16/2016   Procedure: CYSTOSCOPY WITH RETROGRADE PYELOGRAM;  Surgeon: Irine Seal, MD;  Location: [facility];  Service: Urology;  Laterality: Bilateral;   CYSTOSCOPY W/ RETROGRADES Bilateral  01/07/2017   Procedure: CYSTOSCOPY WITH RETROGRADE PYELOGRAM;  Surgeon: Irine Seal, MD;  Location: [facility];  Service: Urology;  Laterality: Bilateral;   CYSTOSCOPY WITH BIOPSY N/A 08/22/2015   Procedure: CYSTOSCOPY WITH BLADDER BIOPSY;  Surgeon: Irine Seal, MD;  Location: [facility];  Service: Urology;  Laterality: N/A;   CYSTOSCOPY WITH BIOPSY N/A 01/16/2016   Procedure: CYSTOSCOPY WITH BLADDER  BIOPSY;  Surgeon: Irine Seal, MD;  Location: [facility];  Service: Urology;  Laterality: N/A;   CYSTOSCOPY WITH FULGERATION N/A 01/16/2016   Procedure: CYSTOSCOPY WITH FULGERATION;  Surgeon: Irine Seal, MD;  Location: [facility];  Service: Urology;  Laterality: N/A;   KNEE ARTHROSCOPY Right 1989   LEFT HEART CATH AND CORONARY ANGIOGRAPHY N/A 01/21/2018   Procedure: LEFT HEART CATH AND CORONARY ANGIOGRAPHY;  Surgeon: Leonie Man, MD;  Location: [place] [facility];  Service: Cardiovascular;; 95% RI-DES PCI.  80% o-pCX (PTCA) -> dCX 80%-100% CTO (unsuccessful PTCA unable to cross distal lesion with balloon.)  100% CTO dLAD. RPDA 80% and 70% as well as P AV 180%, PA V2 60%.  (too small for PCI)   LEFT HEART CATH AND CORONARY ANGIOGRAPHY N/A 09/04/2018   Procedure: LEFT HEART CATH AND CORONARY ANGIOGRAPHY;  Surgeon: Sherren Mocha, MD;  Location: [place] [facility]: (presumed Inferior STEMI) = progression of small rPDA -95% (PTCA only). Patent RI stent. CTO of apical LAD & mCx.   ROTATOR CUFF REPAIR Bilateral 2000   rt. leg fracture surgery     TRANSTHORACIC ECHOCARDIOGRAM  01/21/2018   Mild LVH.  EF 50 and 55%.  Apical inferior hypokinesis.  Mid-apical anterolateral hypokinesis.  Normal diastolic function for age    TRANSTHORACIC ECHOCARDIOGRAM  09/04/2018   -? Inf STEMI vs. Pericarditis:  Mildly reduced EF of 45 to 50%.  Impaired relaxation-GR 1 DD.  Severe hypokinesis of the anterolateral and inferolateral wall.  Small-moderate anterior pericardial effusion.  Moderate aortic sclerosis but no stenosis.    TRANSURETHRAL RESECTION OF BLADDER TUMOR N/A 01/07/2017   Procedure: TRANSURETHRAL RESECTION OF BLADDER TUMOR (TURBT);  Surgeon: Irine Seal, MD;  Location: [facility];  Service: Urology;  Laterality: N/A;   WISDOM TOOTH EXTRACTION       Current Meds  Medication Sig   aspirin 81 MG tablet Take 81 mg by mouth daily.   bisoprolol (ZEBETA) 5 MG tablet Take 0.5 tablets (2.5 mg total) by mouth daily. Take at 2pm   clopidogrel (PLAVIX) 75 MG tablet Take 1 tablet (75 mg total) by mouth daily.   colchicine 0.6 MG tablet Take 1 tablet (0.6 mg total) by mouth 2 (two) times daily.   empagliflozin (JARDIANCE) 25 MG TABS tablet Take 25 mg by mouth daily.   fenofibrate (TRICOR) 48 MG tablet Take 1 tablet (48 mg total) by mouth daily.   HYDROcodone-acetaminophen (NORCO/VICODIN) 5-325 MG tablet Take 1 tablet by mouth 2 (two) times daily as needed for moderate pain.   lisinopril (PRINIVIL,ZESTRIL) 5 MG tablet Take 1 tablet (5 mg total) by mouth daily.   metFORMIN (GLUCOPHAGE) 1000 MG tablet Take 1 tablet by mouth 2 times daily with a meal.   nitroGLYCERIN (NITROSTAT) 0.4 MG SL tablet Place 1 tablet (0.4 mg total) under the tongue every 5 (five) minutes as needed for chest pain.   omeprazole (PRILOSEC) 20 MG capsule TAKE (1) CAPSULE DAILY   oxyCODONE-acetaminophen (PERCOCET/ROXICET) 5-325 MG tablet Take 2 tablets by mouth every 4 (four) hours as needed for severe pain.   primidone (MYSOLINE) 50 MG tablet Take 1 tablet (50 mg total) by mouth 4 (four) times daily.   rosuvastatin (CRESTOR) 40 MG tablet Take 1 tablet (40 mg total) by mouth daily.   sitaGLIPtin (JANUVIA) 100 MG tablet Take 1 tablet (100 mg total) by mouth daily.   tamsulosin (FLOMAX) 0.4 MG CAPS capsule Take 1 capsule (0.4 mg total) by mouth daily.     Allergies:  Tramadol and Trulicity [dulaglutide]   Social History   Tobacco Use   Smoking status: Current Every Day Smoker    Packs/day: 0.50    Years: 42.00    Pack years:  21.00    Types: Cigarettes   Smokeless tobacco: Never Used  Substance Use Topics   Alcohol use: No    Comment: rarely   Drug use: No     Family Hx: The patient's family history includes Cancer in his brother; Dementia in his father; Diabetes in his brother and brother; Diabetes (age of onset: 27) in his mother; Heart attack (age of onset: 65) in his brother; Stroke in his mother; Testicular cancer in his brother. There is no history of Colon cancer, Colon polyps, Esophageal cancer, Rectal cancer, or Stomach cancer.   Prior CV studies:   The following studies were reviewed today - PMH/PSH updated:  TTE 09/04/2018: Mildly reduced EF of 45 to 50%.  Impaired relaxation-GR 1 DD.  Severe hypokinesis of the anterolateral and inferolateral wall.  Small-moderate anterior pericardial effusion.  Moderate aortic sclerosis but no stenosis.  Cath-PTCA 09/03/2008 (Dr. Burt Knack): Multivessel CAD - CTO of apical LAD & mCx. Continue patency of RI stent.  Severe progressive stenosis of RPDA. o PTCA of small caliber RPDA 2.0 mm balloon (low Atm). o Strong consideration was to suggest Myo-pericarditis with ST elevations (an initial negative troponin)   Diagnostic        Intervention     CT Chest/Abd/Pelvis (09/04/2018): Atherosclerotic changes of the aorta without aneurysmal dilatation or dissection.  Emphysematous changes and prior granulomatous changes.  Diverticulosis without diverticulitis.  Small pericardial effusion noted.  Labs/Other Tests and Data Reviewed:    EKG:  An ECG dated 09/11/18 was personally reviewed today and demonstrated:  Sinus rhythm rate of 97 bpm with PACs.  Left atrial marginal.  Cannot exclude anterior MI, age undetermined.  Also persistent inferior ST elevations less than 1 mm in II, removed 3 and aVF.  Notable improvement from previous EKG were the ST elevations are higher.  Recent Labs: 09/03/2018: ALT 13 09/04/2018: Magnesium 2.1 09/11/2018: BUN 14; Creatinine, Ser 0.97;  Hemoglobin 14.1; Platelets 339; Potassium 4.2; Sodium 134   Recent Lipid Panel Lab Results  Component Value Date/Time   CHOL 95 09/04/2018 10:29 AM   CHOL 124 07/24/2018 02:40 PM   TRIG 56 09/04/2018 10:29 AM   HDL 45 09/04/2018 10:29 AM   HDL 39 (L) 07/24/2018 02:40 PM   CHOLHDL 2.1 09/04/2018 10:29 AM   LDLCALC 39 09/04/2018 10:29 AM   LDLCALC 50 07/24/2018 02:40 PM    Wt Readings from Last 3 Encounters:  09/12/18 200 lb (90.7 kg)  09/11/18 204 lb 12.9 oz (92.9 kg)  09/03/18 205 lb (93 kg)     Objective:    Vital Signs:  Ht _0  (1.905 m)    Wt 200 lb (90.7 kg)    BMI 25.00 kg/m   VITAL SIGNS:  reviewed GEN:  Does not appear to be in acute distress, but is notably in pain RESPIRATORY:  Does not appear to be in any respiratory distress NEURO:  alert and oriented x 3, no obvious focal deficit PSYCH:  Upset mood.  ASSESSMENT & PLAN:    Problem List Items Addressed This Visit    Acute ST elevation myocardial infarction (STEMI) of inferolateral wall (HCC)    Actually, physiologically this probably was not truly a STEMI given lack of troponin elevation.  He did undergo PTCA only of the PDA which  is probably the only potential culprit lesion.  Relatively small caliber vessel.  With his persistent chest pain my plan would be to increase his Imdur just in case there is an anginal component.  I do not think that DES PCI was a good option of the PDA and therefore would not relook cath given the negative troponins at the time and negative troponin during his last visit.  Low likelihood that this is occluded.      Relevant Medications   isosorbide mononitrate (IMDUR) 30 MG 24 hr tablet   Acute viral pericarditis - Primary (Chronic)    Presumably viral pericarditis although it more likely idiopathic.  The fact that he had some coughing and some potential URI symptoms are negative more consistent with viral.  There is a pericardial effusion on echocardiogram which would go along with  it.  Unfortunately I was not able to examine him to see if there was a rub and could not tell if the rounding cardiologist was able to note a rub either.  Thankfully, he was tested for COVID-19.  The fact that he clinically is improving without significant dyspnea would argue that he probably was COVID 19 negative.  I went in great detail explained to him the pathophysiology of pericarditis.  This included explained to the patient his daughter and son-in-law.  I explained to this type of pain will probably last off and on for at least another 6 weeks.  The plan is been to be using colchicine as the major to anti-inflammatory.  I did add a prednisone taper for the next 12 days to hopefully help this out.  But I do want him to continue to use the narcotic (his Vicodin first option and Percocet second option) for breakthrough pain hopefully may be after after a few days of using Percocet he could then downgrade to Vicodin as the pain improves.  Thankfully, his chest x-ray did not show any signs of active pneumonia, and he did still do not have positive troponin levels.      Relevant Medications   isosorbide mononitrate (IMDUR) 30 MG 24 hr tablet   Multivessel CAD - CTO dLAD, mCx. DES PCI RI, PTCA of RPDA (Chronic)    Still has pretty significant multivessel disease with an occluded circumflex and LAD.  Relatively sizable prior infarct in the inferolateral wall.  EKG on arrival to the emergency room on 12 April suggested inferior STEMI, so PTCA of the PDA was performed.  This really did not make a difference to his chest pain and Dr. Burt Knack felt that it probably was not the main etiology.  In fact this was confirmed by negative troponin levels indicating that the diagnosis on discharge should not have been Acute Inferior ST Elevation MI.  He is on aspirin Plavix having been converted from [place] because of breathing issues.  I do agree with being overly concerned about using a long-term NSAID taper,  therefore am switching to additional prednisone taper for the acute pericarditis symptoms.  He is on a statin plus fenofibrate, and his labs been well controlled.  His blood pressure and heart rate been well controlled on bisoprolol and lisinopril.  He will continue aspirin plus Plavix for at least 6 months at which time I probably will stop aspirin and continue Plavix pretty much long-term.    After 6 months (October), however Plavix could be stopped if necessary for procedures.      Relevant Medications   isosorbide mononitrate (IMDUR) 30 MG 24 hr  tablet   Stable angina (HCC) (Chronic)    Simply because he does multivessel disease and has had some symptoms of a been anginal in nature, I would increase his Imdur to 30 mg daily for a little bit more antianginal benefit.  He is also on bisoprolol.      Relevant Medications   isosorbide mononitrate (IMDUR) 30 MG 24 hr tablet   Type 2 diabetes mellitus with circulatory disorder, without long-term current use of insulin (HCC) (Chronic)    Is on both Jardiance and Januvia as well as metformin.  Appropriate medicines.  I am little bit worried with the prednisone taper that his blood sugars will go up a little bit.  I want him to monitor his blood sugars little bit closer during the time that he is on a prednisone taper.  Thankfully it is a short taper and we should be okay      Relevant Medications   isosorbide mononitrate (IMDUR) 30 MG 24 hr tablet       COVID-19 Education: The signs and symptoms of COVID-19 were discussed with the patient and how to seek care for testing (follow up with PCP or arrange E-visit).   The importance of social distancing was discussed today.  Time:   Today, I have spent 50 minutes with the patient with telehealth technology discussing the above problems.   At least 20 minutes of the telephone conversation was hearing out the patient and family's complaints and concerns about his hospital stay and ER visit.   They were also complaining about communicating with the office.  I heard all these, try to provide the best explanation as to why it happened but could not speak for the other people involved.  All activity was apologized that this happened.  Eventually they were calm down and happy with my explanations.  They indicated that they had not had this explanation during his hospitalization, had day known that he was can have this pain going on for the next month or so, they probably would not to call EMS yesterday.  I encouraged them to contact [facility] through the patient services line and send a letter of complaint about his feelings on his discharge and then his emergency room discharge.   Medication Adjustments/Labs and Tests Ordered: Current medicines are reviewed at length with the patient today.  Concerns regarding medicines are outlined above.   Patient instructions: The actual true appropriate final diagnosis for your hospital stay was acute viral pericarditis which is inflammation of the sac around the heart.  It was not a heart attack (acute myocardial infarction) because the blood work did not show that. As we discussed, this sharp pain that you are feeling is something that is well-documented with pericarditis and can last up to a month/6 weeks after the initial onset.  The plan is to treat with anti-inflammatory medication such as colchicine.  Usually we would also use Aleve or ibuprofen in conjunction with colchicine, but with you just having had angioplasty and being on aspirin and Plavix, we are reluctant to do that because it will increase risk of bleeding.  The recommendation is to use the narcotics such as Vicodin (hydrocodone) or Percocet (oxycodone) for breakthrough pain.  I am now adding a prednisone taper to help hopefully help as another option for anti-inflammatory treatment.  When you are on prednisone, you will need to closely follow your blood sugar levels, Because they  may go high.  Please send a letter to [facility]  [facility] --care of both the emergency room and the hospital for that you are on explaining your complete concerns about initially the hospitalization discharge issues with explaining medications, and the emergency room issues about feeling like you are "dumped out on the street".  We need to know this important information in your concerns in order to improve the quality of patient care.  Unfortunately we cannot make out for what happened to you, but we can prevent having to somebody else.  I apologize for the inconvenience and you being upset about these issues.   Medication Instructions:   As we discussed on the phone: The colchicine is the primary medicine for treating your diagnosis of pericarditis.  Continue taking colchicine as directed with the plan to be to take it for at least a month if not 2 months.  Continue to use Vicodin (as needed every 4-6 hours) as the first choice and then Percocet (if used, would be every 6-8 hours) as a second choice for breakthrough pain.    New prescriptions:   Prednisone taper: 60 mg daily for 3 days, 40 mg daily for 3 days, 20 mg daily for 3 days, 10 mg daily for 3 days.   I am also increasing Imdur dose (isosorbide mononitrate) to 30 mg daily.  Tests Ordered: No orders of the defined types were placed in this encounter. -No new tests.  Medication Changes: Meds ordered this encounter  Medications   isosorbide mononitrate (IMDUR) 30 MG 24 hr tablet    Sig: Take 1 tablet (30 mg total) by mouth daily.    Dispense:  30 tablet    Refill:  6   predniSONE (DELTASONE) 10 MG tablet    Sig: Take 6 tablets (60 mg total) by mouth daily with breakfast for 3 days, THEN 4 tablets (40 mg total) daily with breakfast for 3 days, THEN 2 tablets (20 mg total) daily with breakfast for 3 days, THEN 1 tablet (10 mg total) daily with breakfast for 3 days.    Dispense:  39 tablet    Refill:  0   Prednisone taper: 60  mg daily for 3 days, 40 mg daily for 3 days, 20 mg daily for 3 days, 10 mg daily for 3 days.   I am also increasing Imdur dose (isosorbide mononitrate) to 30 mg daily.  Disposition:  Follow up As scheduled for next week    Signed, Glenetta Hew, MD  09/12/2018 5:39 PM    [place]

## 2018-09-12 NOTE — Telephone Encounter (Signed)
   RN LEFT MESSAGE   OF AVS SUMMARY ON DAUGHTERS VOICE MAIL . ANY QUESTION MAY CALL BACK . AVS SUMMARY WILL BE RECEIVED BY MAIL.

## 2018-09-12 NOTE — Assessment & Plan Note (Signed)
Is on both Jardiance and Januvia as well as metformin.  Appropriate medicines.  I am little bit worried with the prednisone taper that his blood sugars will go up a little bit.  I want him to monitor his blood sugars little bit closer during the time that he is on a prednisone taper.  Thankfully it is a short taper and we should be okay

## 2018-09-13 ENCOUNTER — Telehealth: Payer: Self-pay | Admitting: Cardiology

## 2018-09-13 ENCOUNTER — Telehealth: Payer: Medicare Other | Admitting: Adult Health

## 2018-09-13 NOTE — Telephone Encounter (Signed)
Per daughter's call please give her a call back she has a question about the pt's medications

## 2018-09-13 NOTE — Telephone Encounter (Signed)
Prolonged TeleHealth visit appointment yesterday.  Issues discussed & plan in place.  Glenetta Hew, MD

## 2018-09-13 NOTE — Telephone Encounter (Signed)
DAUGHTER STATED PATIENT WANTED TO KNOW IF THE PREDNISONE TABLETS  CAN BE TAKEN ALL  AT ONE TIME EACH DAY .   RN INFORMED THE DAUGHTER THAT YES ALL THE TABLETS CAN BE  TAKEN AT ONE TIME  DAILY WITH FOOD .  SHE VERBALIZED UNDERSTANDING AND STATE SHE WILL TELL HER FATHER.

## 2018-09-15 ENCOUNTER — Telehealth: Payer: Self-pay | Admitting: *Deleted

## 2018-09-15 NOTE — Telephone Encounter (Signed)
Left message to call back  Need to discuss 4/29 appointment.

## 2018-09-18 NOTE — Telephone Encounter (Signed)
Spoke to daughter - the appointment 09/20/18 will be in the office visit. It will be okay for daughter to be with patient.( she assist in his care)

## 2018-09-18 NOTE — Telephone Encounter (Signed)
Follow up  ° ° °Patient is returning your call. °

## 2018-09-20 ENCOUNTER — Encounter: Payer: Self-pay | Admitting: Cardiology

## 2018-09-20 ENCOUNTER — Ambulatory Visit (INDEPENDENT_AMBULATORY_CARE_PROVIDER_SITE_OTHER): Payer: Medicare Other | Admitting: Cardiology

## 2018-09-20 ENCOUNTER — Other Ambulatory Visit: Payer: Self-pay

## 2018-09-20 VITALS — BP 100/58 | HR 83 | Ht 75.5 in | Wt 198.4 lb

## 2018-09-20 DIAGNOSIS — I10 Essential (primary) hypertension: Secondary | ICD-10-CM | POA: Diagnosis not present

## 2018-09-20 DIAGNOSIS — I25119 Atherosclerotic heart disease of native coronary artery with unspecified angina pectoris: Secondary | ICD-10-CM

## 2018-09-20 DIAGNOSIS — I301 Infective pericarditis: Secondary | ICD-10-CM

## 2018-09-20 DIAGNOSIS — Z955 Presence of coronary angioplasty implant and graft: Secondary | ICD-10-CM

## 2018-09-20 DIAGNOSIS — I208 Other forms of angina pectoris: Secondary | ICD-10-CM

## 2018-09-20 NOTE — Progress Notes (Signed)
PCP: Dettinger, Fransisca Kaufmann, MD  Clinic Note: Chief Complaint  Patient presents with  . Hospitalization Follow-up  . Pericardial Effusion    Pericarditis  . Coronary Artery Disease    HPI: Andrew [name] is a 68 y.o. male with a PMH notable for CAD-PCI with multivessel disease otherwise treated medically who presents today for 1 week follow-up after telemedicine visit last week with persistent symptoms of Myopericarditis.  Andrew [name] was last seen via telephone telehealth visit on on September 12, 2018.  This was because of telephone calls in for persistent chest pain and bounced back to the ER for evaluation.   --At that time I started him on a prednisone taper for persistent pericarditis pain (wanting to avoid NSAIDs because of his antiplatelet agents).  I also told him to use his narcotic pain medications (has prescriptions for both Norco and Percocet) for breakthrough pain.  Recent Hospitalizations:   Echo 12/14.  Chest pain shortness of breath: Possible NSTEMI elevation MI, had PTCA of PDA as a culprit for progression of disease.  Never ruled in for MI, symptoms were thought to be more consistent with myopericarditis.  Was started on colchicine.  Studies Personally Reviewed - (if available, images/films reviewed: From Epic Chart or Care Everywhere)  No new studies since telehealth visit  Interval History: Andrew [name] is here today for follow-up after starting his prednisone taper.  Nearing the end of the course.  He says that about 3 days ago he started really noticing a change where he is now barely having any of the sharp stabbing pains.  He still feels a little weak and tired and fatigued, somewhat positional dizziness, but denies any syncope or near syncope. Has not had any of his anginal type chest pain or pressure with rest or exertion.  More fatigue with exertion and exert substernal dyspnea.  Cardiovascular ROS: positive for - Mild fatigue and weakness.  Much less frequent pleuritic  chest pain negative for - dyspnea on exertion, irregular heartbeat, orthopnea, palpitations, paroxysmal nocturnal dyspnea, rapid heart rate, shortness of breath or Syncope/near syncope, TIA system nursing experience melena, hematochezia hematuria or epistaxis.  Claudication  ROS: A comprehensive was performed. Review of Systems  Constitutional: Positive for malaise/fatigue. Negative for chills, fever and weight loss.  HENT: Negative for congestion and nosebleeds.   Respiratory: Negative for cough, shortness of breath and wheezing.   Cardiovascular: Positive for chest pain (See HPI).  Gastrointestinal: Negative for blood in stool, constipation, heartburn and melena.  Genitourinary: Negative for hematuria.  Musculoskeletal: Negative for joint pain.  Neurological: Positive for dizziness.  Endo/Heme/Allergies: Positive for environmental allergies.  All other systems reviewed and are negative.   The patient does have symptoms concerning for COVID-19 infection (fever, chills, cough, or new shortness of breath).  However he has been evaluated for COVID-19 and his recent hospitalization was negative.  No new symptoms since that time. The patient is practicing social distancing  I have reviewed and (if needed) personally updated the patient's problem list, medications, allergies, past medical and surgical history, social and family history.   Past Medical History:  Diagnosis Date  . Acute viral pericarditis 09/03/2018   Inferior STE - but Negative Troponin.  CP &SOB.  Felt to be Viral.  . Anemia   . Arthritis   . Bladder cancer ([place])   . Depression   . Diabetes mellitus without complication ([place])   . Essential hypertension 01/20/2018   in the past no longer on medication  . GERD (gastroesophageal  reflux disease)   . Hyperlipidemia with target LDL less than 70 12/19/2015  . Multivessel CAD - CTO dLAD, mCx. DES PCI RI, PTCA of RPDA 01/21/2018   12/2017 - Cath for ? STEMI -> distal/apical LAD &  m-dCx CTO (unable to cross Cx).  Mod rPDA. Severe RI - DES PCI.  08/2018 - Cath for ? Inf STEMI - RI stent patent & CTO dLAD/mCx. Progression of rPDA 95% -> PTCA only.  Thought to be Pericarditis & not MI (troponin negative).   . Neuromuscular disorder ([place])   . Sleep apnea    BiPAP not currently being used  . Status post tendon repair 1989  . STEMI (ST elevation myocardial infarction) ([place]) 01/20/2018   Cardiac cath January 21, 2018: Severe multivessel disease with unclear lesion, but opted for PCI/DES x1 to the RI.  Appeared to have CTO of the distal LAD and circumflex as well as moderate mid LAD and diagonal disease as well as PDA.Marland Kitchen  Unable to cross circumflex lesion.  . Stroke ([place])    At times pt has dizziness with loss of vision  . Tremors of nervous system 2011    Past Surgical History:  Procedure Laterality Date  . APPENDECTOMY    . BLADDER SURGERY    . COLONOSCOPY    . CORONARY STENT INTERVENTION N/A 01/21/2018   Procedure: CORONARY STENT INTERVENTION;  Surgeon: Leonie Man, MD;  Location: [facility];  Service: Cardiovascular;  Laterality: N/A;  95% Ramus Intermedius -PCI with synergy DES 2.25 mm x 12 mm postdilated 2.4 mm.  [place] ACUTE MI REVASCULARIZATION N/A 01/21/2018   Procedure: Coronary/Graft Acute MI Revascularization;  Surgeon: Leonie Man, MD;  Location: [facility];  Service: Cardiovascular;  Laterality: N/A;  attempted revas to distal CFX;   . CORONARY/GRAFT ACUTE MI REVASCULARIZATION N/A 09/04/2018   Procedure: Coronary/Graft Acute MI Revascularization;  Surgeon: Sherren Mocha, MD;  Location: [place]:: PTCA/POBA of rPDA (2.0 mm balloon)  . CYSTOSCOPY W/ RETROGRADES Bilateral 08/22/2015   Procedure: CYSTOSCOPY WITH RETROGRADE PYELOGRAM;  Surgeon: Irine Seal, MD;  Location: [facility];  Service: Urology;  Laterality: Bilateral;  . CYSTOSCOPY W/ RETROGRADES Bilateral 01/16/2016   Procedure: CYSTOSCOPY WITH RETROGRADE PYELOGRAM;   Surgeon: Irine Seal, MD;  Location: [facility];  Service: Urology;  Laterality: Bilateral;  . CYSTOSCOPY W/ RETROGRADES Bilateral 01/07/2017   Procedure: CYSTOSCOPY WITH RETROGRADE PYELOGRAM;  Surgeon: Irine Seal, MD;  Location: [facility];  Service: Urology;  Laterality: Bilateral;  . CYSTOSCOPY WITH BIOPSY N/A 08/22/2015   Procedure: CYSTOSCOPY WITH BLADDER BIOPSY;  Surgeon: Irine Seal, MD;  Location: [facility];  Service: Urology;  Laterality: N/A;  . CYSTOSCOPY WITH BIOPSY N/A 01/16/2016   Procedure: CYSTOSCOPY WITH BLADDER BIOPSY;  Surgeon: Irine Seal, MD;  Location: [facility];  Service: Urology;  Laterality: N/A;  . CYSTOSCOPY WITH FULGERATION N/A 01/16/2016   Procedure: CYSTOSCOPY WITH FULGERATION;  Surgeon: Irine Seal, MD;  Location: [facility];  Service: Urology;  Laterality: N/A;  . KNEE ARTHROSCOPY Right 1989  . LEFT HEART CATH AND CORONARY ANGIOGRAPHY N/A 01/21/2018   Procedure: LEFT HEART CATH AND CORONARY ANGIOGRAPHY;  Surgeon: Leonie Man, MD;  Location: [facility];  Service: Cardiovascular;; 95% RI-DES PCI.  80% o-pCX (PTCA) -> dCX 80%-100% CTO (unsuccessful PTCA unable to cross distal lesion with balloon.)  100% CTO dLAD. RPDA 80% and 70% as well as P AV 180%, PA V2 60%.  (too small for PCI)  . LEFT  HEART CATH AND CORONARY ANGIOGRAPHY N/A 09/04/2018   Procedure: LEFT HEART CATH AND CORONARY ANGIOGRAPHY;  Surgeon: Sherren Mocha, MD;  Location: [facility]: (presumed Inferior STEMI) = progression of small rPDA -95% (PTCA only). Patent RI stent. CTO of apical LAD & mCx.  Marland Kitchen ROTATOR CUFF REPAIR Bilateral 2000  . rt. leg fracture surgery    . TRANSTHORACIC ECHOCARDIOGRAM  01/21/2018   Mild LVH.  EF 50 and 55%.  Apical inferior hypokinesis.  Mid-apical anterolateral hypokinesis.  Normal diastolic function for age   . TRANSTHORACIC ECHOCARDIOGRAM  09/04/2018   -? Inf STEMI vs. Pericarditis:  Mildly reduced EF of 45 to 50%.  Impaired relaxation-GR 1 DD.  Severe hypokinesis of the  anterolateral and inferolateral wall.  Small-moderate anterior pericardial effusion.  Moderate aortic sclerosis but no stenosis.  . TRANSURETHRAL RESECTION OF BLADDER TUMOR N/A 01/07/2017   Procedure: TRANSURETHRAL RESECTION OF BLADDER TUMOR (TURBT);  Surgeon: Irine Seal, MD;  Location: [facility];  Service: Urology;  Laterality: N/A;  . WISDOM TOOTH EXTRACTION     Diagnostic 09/04/2018                                          Intervention - PDA PTCA   Current Meds  Medication Sig  . aspirin 81 MG tablet Take 81 mg by mouth daily.  . bisoprolol (ZEBETA) 5 MG tablet Take 0.5 tablets (2.5 mg total) by mouth daily. Take at 2pm  . clopidogrel (PLAVIX) 75 MG tablet Take 1 tablet (75 mg total) by mouth daily.  . colchicine 0.6 MG tablet Take 1 tablet (0.6 mg total) by mouth 2 (two) times daily.  . empagliflozin (JARDIANCE) 25 MG TABS tablet Take 25 mg by mouth daily.  . fenofibrate (TRICOR) 48 MG tablet Take 1 tablet (48 mg total) by mouth daily.  Marland Kitchen HYDROcodone-acetaminophen (NORCO/VICODIN) 5-325 MG tablet Take 1 tablet by mouth 2 (two) times daily as needed for moderate pain.  . isosorbide mononitrate (IMDUR) 30 MG 24 hr tablet Take 1 tablet (30 mg total) by mouth daily.  Marland Kitchen lisinopril (PRINIVIL,ZESTRIL) 5 MG tablet Take 1 tablet (5 mg total) by mouth daily.  . metFORMIN (GLUCOPHAGE) 1000 MG tablet Take 1 tablet by mouth 2 times daily with a meal.  . nitroGLYCERIN (NITROSTAT) 0.4 MG SL tablet Place 1 tablet (0.4 mg total) under the tongue every 5 (five) minutes as needed for chest pain.  Marland Kitchen omeprazole (PRILOSEC) 20 MG capsule TAKE (1) CAPSULE DAILY  . oxyCODONE-acetaminophen (PERCOCET/ROXICET) 5-325 MG tablet Take 2 tablets by mouth every 4 (four) hours as needed for severe pain.  . predniSONE (DELTASONE) 10 MG tablet Take 6 tablets (60 mg total) by mouth daily with breakfast for 3 days, THEN 4 tablets (40 mg total) daily with breakfast for 3 days, THEN 2 tablets (20 mg total) daily with breakfast for 3  days, THEN 1 tablet (10 mg total) daily with breakfast for 3 days.  . primidone (MYSOLINE) 50 MG tablet Take 1 tablet (50 mg total) by mouth 4 (four) times daily.  . rosuvastatin (CRESTOR) 40 MG tablet Take 1 tablet (40 mg total) by mouth daily.  . sitaGLIPtin (JANUVIA) 100 MG tablet Take 1 tablet (100 mg total) by mouth daily.  . tamsulosin (FLOMAX) 0.4 MG CAPS capsule Take 1 capsule (0.4 mg total) by mouth daily.    Allergies  Allergen Reactions  . Tramadol Shortness  Of Breath    And dizziness  . Trulicity [Dulaglutide] Swelling    Ankles and feet swell    Social History   Tobacco Use  . Smoking status: Former Smoker    Packs/day: 0.50    Years: 42.00    Pack years: 21.00    Types: Cigarettes    Last attempt to quit: 12/2017    Years since quitting: 0.7  . Smokeless tobacco: Never Used  Substance Use Topics  . Alcohol use: No    Comment: rarely  . Drug use: No   Social History   Social History Narrative  . Not on file    family history includes Cancer in his brother; Dementia in his father; Diabetes in his brother and brother; Diabetes (age of onset: 16) in his mother; Heart attack (age of onset: 73) in his brother; Stroke in his mother; Testicular cancer in his brother.  Wt Readings from Last 3 Encounters:  09/20/18 198 lb 6.4 oz (90 kg)  09/12/18 200 lb (90.7 kg)  09/11/18 204 lb 12.9 oz (92.9 kg)    PHYSICAL EXAM BP (!) 100/58   Pulse 83   Ht 6' 3.5" (1.918 m)   Wt 198 lb 6.4 oz (90 kg)   SpO2 95%   BMI 24.47 kg/m  Physical Exam  Constitutional: He is oriented to person, place, and time. He appears well-developed and well-nourished. No distress.  Healthy-appearing.  Well-groomed  HENT:  Head: Normocephalic and atraumatic.  Eyes: EOM are normal.  Neck: Normal range of motion. Neck supple. No hepatojugular reflux and no JVD present. Carotid bruit is not present.  Cardiovascular: Normal rate, regular rhythm, normal heart sounds, intact distal pulses and  normal pulses.  Occasional extrasystoles are present. PMI is not displaced. Exam reveals no gallop and no friction rub.  No murmur heard. Pulmonary/Chest: Effort normal. No respiratory distress. He has no wheezes. He has no rales. He exhibits no tenderness.  Abdominal: Soft. Bowel sounds are normal. He exhibits no distension. There is no abdominal tenderness. There is no rebound.  Musculoskeletal: Normal range of motion.        General: No edema.  Neurological: He is alert and oriented to person, place, and time.  Psychiatric: He has a normal mood and affect. His behavior is normal. Judgment and thought content normal.  Vitals reviewed.    Adult ECG Report  n/a  Other studies Reviewed: Additional studies/ records that were reviewed today include:  Recent Labs:  n/a  ASSESSMENT / PLAN: Problem List Items Addressed This Visit    Acute viral pericarditis - Primary (Chronic)    Seems to be doing much better now after starting the prednisone taper.  He is now down to 10 mg a day for 4 days part of his taper.  Minimal pleuritic pain now.  He seems much more happy understanding the pathophysiology and exactly why he is taking everything.  Plan: Continue sufficient prednisone taper, continue colchicine for 22-month course.      Essential hypertension (Chronic)    Blood pressure still remains low but him having fatigue and dizziness about have him cut his lisinopril dose in half, then reassess symptoms in 2 weeks.  If still having dizziness and fatigue, I am to have him fully stop lisinopril.      Multivessel CAD - CTO dLAD, mCx. DES PCI RI, PTCA of RPDA (Chronic)    Doing well with no real anginal symptoms now.  Is on Plavix without aspirin.  On statin and  beta-blocker.  He is on ACE inhibitor, but with positional dizziness and fatigue-wean off the ACE inhibitor.  Blood pressure is low but low today.      Presence of drug coated stent in ramus intermedius coronary artery (Chronic)     Switched from Brilinta to Plavix when he had PTCA of the PDA recently.  Because of financial reasons we will continue on Plavix.  I would like for him to stay on uninterrupted Plavix until end of August of this year 2020.  After that time we can have interrupted Plavix for another year.   As of January 23, 2019, would be okay to hold Plavix for procedures.         COVID-19 Education: The signs and symptoms of COVID-19 were discussed with the patient and how to seek care for testing (follow up with PCP or arrange E-visit).   The importance of social distancing was discussed today.  I spent a total of 25 minutes with the patient and chart review. >  50% of the time was spent in direct patient consultation.   Current medicines are reviewed at length with the patient today.  (+/- concerns) see below The following changes have been made:   Cut Lisinopril to 1/2 tab daily x 2-3 weeks.  If still feeling dizzy & fatigued - stop all together.  Patient Instructions  Medication Instructions:  LISINOPRIL  1/2 TABLET  OF  5 MG  ( 2.5 MG )DAILY   FOR NEXT 2 WEEKS , IF YOU STILL FEEL BAD THE STOP TAKING THIS MEDICATION.   CONTINUE TAKING COLCHICINE UNTIL THE BOTTLE IS EMPTY.   If you need a refill on your cardiac medications before your next appointment, please call your pharmacy.   Lab work: NOT NEEDED If you have labs (blood work) drawn today and your tests are completely normal, you will receive your results only by: Marland Kitchen MyChart Message (if you have MyChart) OR . A paper copy in the mail If you have any lab test that is abnormal or we need to change your treatment, we will call you to review the results.  Testing/Procedures: NOT NEEDED  Follow-Up: At [facility], you and your health needs are our priority.  As part of our continuing mission to provide you with exceptional heart care, we have created designated Provider Care Teams.  These Care Teams include your primary Cardiologist  (physician) and Advanced Practice Providers (APPs -  Physician Assistants and Nurse Practitioners) who all work together to provide you with the care you need, when you need it. You will need a follow up appointment in 5 months.  Please call our office 2 months in advance to schedule this appointment.  You may see Glenetta Hew, MD or one of the following Advanced Practice Providers on your designated Care Team:   Rosaria Ferries, PA-C . Jory Sims, DNP, ANP  Any Other Special Instructions Will Be Listed Below (If Applicable).    Studies Ordered:   No orders of the defined types were placed in this encounter.  ROV 3-4 months     Glenetta Hew, M.D., M.S. Interventional Cardiologist   Pager # [phone] Phone # [phone] [address] [place], [place] [address]   Thank you for choosing Heartcare at [facility]!!

## 2018-09-20 NOTE — Assessment & Plan Note (Signed)
Seems to be doing much better now after starting the prednisone taper.  He is now down to 10 mg a day for 4 days part of his taper.  Minimal pleuritic pain now.  He seems much more happy understanding the pathophysiology and exactly why he is taking everything.  Plan: Continue sufficient prednisone taper, continue colchicine for 94-month course.

## 2018-09-20 NOTE — Assessment & Plan Note (Signed)
Blood pressure still remains low but him having fatigue and dizziness about have him cut his lisinopril dose in half, then reassess symptoms in 2 weeks.  If still having dizziness and fatigue, I am to have him fully stop lisinopril.

## 2018-09-20 NOTE — Assessment & Plan Note (Signed)
Doing well with no real anginal symptoms now.  Is on Plavix without aspirin.  On statin and beta-blocker.  He is on ACE inhibitor, but with positional dizziness and fatigue-wean off the ACE inhibitor.  Blood pressure is low but low today.

## 2018-09-20 NOTE — Patient Instructions (Addendum)
Medication Instructions:  LISINOPRIL  1/2 TABLET  OF  5 MG  ( 2.5 MG )DAILY   FOR NEXT 2 WEEKS , IF YOU STILL FEEL BAD THE STOP TAKING THIS MEDICATION.   CONTINUE TAKING COLCHICINE UNTIL THE BOTTLE IS EMPTY.   If you need a refill on your cardiac medications before your next appointment, please call your pharmacy.   Lab work: NOT NEEDED If you have labs (blood work) drawn today and your tests are completely normal, you will receive your results only by: Marland Kitchen MyChart Message (if you have MyChart) OR . A paper copy in the mail If you have any lab test that is abnormal or we need to change your treatment, we will call you to review the results.  Testing/Procedures: NOT NEEDED  Follow-Up: At [facility], you and your health needs are our priority.  As part of our continuing mission to provide you with exceptional heart care, we have created designated Provider Care Teams.  These Care Teams include your primary Cardiologist (physician) and Advanced Practice Providers (APPs -  Physician Assistants and Nurse Practitioners) who all work together to provide you with the care you need, when you need it. You will need a follow up appointment in 5 months.  Please call our office 2 months in advance to schedule this appointment.  You may see Glenetta Hew, MD or one of the following Advanced Practice Providers on your designated Care Team:   Rosaria Ferries, PA-C . Jory Sims, DNP, ANP  Any Other Special Instructions Will Be Listed Below (If Applicable).

## 2018-09-20 NOTE — Assessment & Plan Note (Signed)
Switched from Brilinta to Plavix when he had PTCA of the PDA recently.  Because of financial reasons we will continue on Plavix.  I would like for him to stay on uninterrupted Plavix until end of August of this year 2020.  After that time we can have interrupted Plavix for another year.   As of January 23, 2019, would be okay to hold Plavix for procedures.

## 2018-10-11 ENCOUNTER — Ambulatory Visit (INDEPENDENT_AMBULATORY_CARE_PROVIDER_SITE_OTHER): Payer: Medicare Other | Admitting: Family Medicine

## 2018-10-11 ENCOUNTER — Other Ambulatory Visit: Payer: Self-pay

## 2018-10-11 ENCOUNTER — Telehealth: Payer: Self-pay | Admitting: Cardiology

## 2018-10-11 ENCOUNTER — Encounter: Payer: Self-pay | Admitting: Family Medicine

## 2018-10-11 VITALS — BP 110/66 | HR 98 | Temp 98.7°F | Ht 75.5 in | Wt 190.2 lb

## 2018-10-11 DIAGNOSIS — G250 Essential tremor: Secondary | ICD-10-CM

## 2018-10-11 DIAGNOSIS — I208 Other forms of angina pectoris: Secondary | ICD-10-CM | POA: Diagnosis not present

## 2018-10-11 DIAGNOSIS — I301 Infective pericarditis: Secondary | ICD-10-CM | POA: Diagnosis not present

## 2018-10-11 DIAGNOSIS — Z79891 Long term (current) use of opiate analgesic: Secondary | ICD-10-CM | POA: Diagnosis not present

## 2018-10-11 DIAGNOSIS — J441 Chronic obstructive pulmonary disease with (acute) exacerbation: Secondary | ICD-10-CM

## 2018-10-11 DIAGNOSIS — M1990 Unspecified osteoarthritis, unspecified site: Secondary | ICD-10-CM

## 2018-10-11 DIAGNOSIS — E118 Type 2 diabetes mellitus with unspecified complications: Secondary | ICD-10-CM | POA: Diagnosis not present

## 2018-10-11 MED ORDER — PRIMIDONE 50 MG PO TABS: 50.0000 mg | ORAL_TABLET | Freq: Four times a day (QID) | ORAL | 1 refills | Status: DC

## 2018-10-11 MED ORDER — PREDNISONE 20 MG PO TABS: ORAL_TABLET | ORAL | 0 refills | Status: DC

## 2018-10-11 MED ORDER — HYDROCODONE-ACETAMINOPHEN 5-325 MG PO TABS: 1.0000 | ORAL_TABLET | Freq: Three times a day (TID) | ORAL | 0 refills | Status: DC | PRN

## 2018-10-11 NOTE — Telephone Encounter (Signed)
No labs needed, patient aware.

## 2018-10-11 NOTE — Telephone Encounter (Signed)
New Message   Patient is calling because he has an upcoming appt today with another provider. He is wondering are there any labs that Dr. Ellyn Hack may want him to have and he can have them done at that appt. Please call.

## 2018-10-11 NOTE — Telephone Encounter (Signed)
To Dr. Sharlyn Bologna, RN to review.

## 2018-10-11 NOTE — Progress Notes (Signed)
BP 110/66   Pulse 98   Temp 98.7 F (37.1 C) (Oral)   Ht 6' 3.5" (1.918 m)   Wt 190 lb 3.2 oz (86.3 kg)   BMI 23.46 kg/m    Subjective:   Patient ID: Andrew [name], male    DOB: [birth year], 68 y.o.   MRN: [id number]  HPI: Andrew [name] is a 68 y.o. male presenting on 10/11/2018 for Diabetes (3 month follow up) and Chest Pain (Patient states that he has been chest pain since before he went to the hospital 4/12)   HPI Pain assessment: Cause of pain-arthritis in back and legs Pain location-lower spine and hips and knees Pain on scale of 1-10-1-2 Frequency-not daily but a couple times a week probably What increases pain-gets stiff at rest and sometimes prolonged movement or standing can affect him as well What makes pain Better-stretching and relaxed movements and sometimes resting Effects on ADL -minimal Any change in general medical condition-none, was recently ill in the hospital but is recovering with some mild chest pain Current opioids rx-hydrocodone 5-3 25 every 8 hours as needed # meds rx-60 Effectiveness of current meds-work well when he has to use it Adverse reactions form pain meds-none Morphine equivalent-10  Pill count performed-No Last drug screen -today ( high risk q12m moderate risk q649mlow risk yearly ) Urine drug screen today- Yes Was the NCPende[place]-yes  If yes were their any concerning findings? -None  No flowsheet data found.   Pain contract signed on: none currently  Hospital follow-up for pericarditis Patient is coming in for hospital follow-up for pericarditis, he was in the hospital with chest pain twice over the past month and a half and initially was diagnosed with possible MI and then upon further evaluation it was believed to be a pericarditis that was likely viral instead.  He was tested for coronavirus and found to be negative.  He says it is improved with a short course steroids but they could not give him anti-inflammatories because he  was on blood thinners and could not give him more steroids because of his diabetes that was out of control and they were concerned about that.  Patient denies any shortness of breath but he still has that mild chest pain that is in the center of his chest that feels like a sharp pain.  He has followed up with his cardiologist outpatient who agreed with the pericarditis.  Relevant past medical, surgical, family and social history reviewed and updated as indicated. Interim medical history since our last visit reviewed. Allergies and medications reviewed and updated.  Review of Systems  Constitutional: Negative for chills and fever.  Respiratory: Negative for shortness of breath and wheezing.   Cardiovascular: Positive for chest pain. Negative for palpitations and leg swelling.  Musculoskeletal: Negative for back pain and gait problem.  Skin: Negative for rash.  Neurological: Negative for dizziness, weakness, light-headedness and headaches.  All other systems reviewed and are negative.   Per HPI unless specifically indicated above   Allergies as of 10/11/2018      Reactions   Tramadol Shortness Of Breath   And dizziness   Trulicity [dulaglutide] Swelling   Ankles and feet swell      Medication List       Accurate as of Oct 11, 2018 11:59 PM. If you have any questions, ask your nurse or doctor.        aspirin 81 MG tablet Take 81 mg by mouth daily.   bisoprolol 5  MG tablet Commonly known as:  ZEBETA Take 0.5 tablets (2.5 mg total) by mouth daily. Take at 2pm   clopidogrel 75 MG tablet Commonly known as:  Plavix Take 1 tablet (75 mg total) by mouth daily.   colchicine 0.6 MG tablet Take 1 tablet (0.6 mg total) by mouth 2 (two) times daily.   empagliflozin 25 MG Tabs tablet Commonly known as:  JARDIANCE Take 25 mg by mouth daily.   fenofibrate 48 MG tablet Commonly known as:  Tricor Take 1 tablet (48 mg total) by mouth daily.   HYDROcodone-acetaminophen 5-325 MG tablet  Commonly known as:  NORCO/VICODIN Take 1 tablet by mouth every 8 (eight) hours as needed for moderate pain or severe pain. What changed:    when to take this  reasons to take this Changed by:  Worthy Rancher, MD   isosorbide mononitrate 30 MG 24 hr tablet Commonly known as:  IMDUR Take 1 tablet (30 mg total) by mouth daily.   lisinopril 5 MG tablet Commonly known as:  ZESTRIL Take 1 tablet (5 mg total) by mouth daily.   metFORMIN 1000 MG tablet Commonly known as:  GLUCOPHAGE Take 1 tablet by mouth 2 times daily with a meal.   nitroGLYCERIN 0.4 MG SL tablet Commonly known as:  NITROSTAT Place 1 tablet (0.4 mg total) under the tongue every 5 (five) minutes as needed for chest pain.   omeprazole 20 MG capsule Commonly known as:  PRILOSEC TAKE (1) CAPSULE DAILY   oxyCODONE-acetaminophen 5-325 MG tablet Commonly known as:  PERCOCET/ROXICET Take 2 tablets by mouth every 4 (four) hours as needed for severe pain.   predniSONE 20 MG tablet Commonly known as:  DELTASONE Take 3 tabs daily for 1 week, then 2 tabs daily for week 2, then 1 tab daily for week 3. Started by:  Worthy Rancher, MD   primidone 50 MG tablet Commonly known as:  MYSOLINE Take 1 tablet (50 mg total) by mouth 4 (four) times daily.   rosuvastatin 40 MG tablet Commonly known as:  CRESTOR Take 1 tablet (40 mg total) by mouth daily.   sitaGLIPtin 100 MG tablet Commonly known as:  JANUVIA Take 1 tablet (100 mg total) by mouth daily.   tamsulosin 0.4 MG Caps capsule Commonly known as:  FLOMAX Take 1 capsule (0.4 mg total) by mouth daily.        Objective:   BP 110/66   Pulse 98   Temp 98.7 F (37.1 C) (Oral)   Ht 6' 3.5" (1.918 m)   Wt 190 lb 3.2 oz (86.3 kg)   BMI 23.46 kg/m   Wt Readings from Last 3 Encounters:  10/11/18 190 lb 3.2 oz (86.3 kg)  09/20/18 198 lb 6.4 oz (90 kg)  09/12/18 200 lb (90.7 kg)    Physical Exam Vitals signs and nursing note reviewed.  Constitutional:       General: He is not in acute distress.    Appearance: He is well-developed. He is not diaphoretic.  Eyes:     General: No scleral icterus.       Right eye: No discharge.     Conjunctiva/sclera: Conjunctivae normal.     Pupils: Pupils are equal, round, and reactive to light.  Neck:     Musculoskeletal: Neck supple.     Thyroid: No thyromegaly.  Cardiovascular:     Rate and Rhythm: Normal rate and regular rhythm.     Heart sounds: Normal heart sounds. No murmur.  Pulmonary:  Effort: Pulmonary effort is normal. No respiratory distress.     Breath sounds: Normal breath sounds. No wheezing.  Chest:     Chest wall: No tenderness (Nonreproducible).  Musculoskeletal: Normal range of motion.  Lymphadenopathy:     Cervical: No cervical adenopathy.  Skin:    General: Skin is warm and dry.     Findings: No rash.  Neurological:     Mental Status: He is alert and oriented to person, place, and time.     Coordination: Coordination normal.  Psychiatric:        Behavior: Behavior normal.     Results for orders placed or performed in visit on 10/11/18  ToxASSURE Select 13 (MW), Urine  Result Value Ref Range   Summary FINAL   CMP14+EGFR  Result Value Ref Range   Glucose 164 (H) 65 - 99 mg/dL   BUN 14 8 - 27 mg/dL   Creatinine, Ser 0.83 0.76 - 1.27 mg/dL   GFR calc non Af Amer 90 >59 mL/min/1.73   GFR calc Af Amer 105 >59 mL/min/1.73   BUN/Creatinine Ratio 17 10 - 24   Sodium 137 134 - 144 mmol/L   Potassium 5.0 3.5 - 5.2 mmol/L   Chloride 98 96 - 106 mmol/L   CO2 23 20 - 29 mmol/L   Calcium 9.8 8.6 - 10.2 mg/dL   Total Protein 7.0 6.0 - 8.5 g/dL   Albumin 3.8 3.8 - 4.8 g/dL   Globulin, Total 3.2 1.5 - 4.5 g/dL   Albumin/Globulin Ratio 1.2 1.2 - 2.2   Bilirubin Total <0.2 0.0 - 1.2 mg/dL   Alkaline Phosphatase 73 39 - 117 IU/L   AST 8 0 - 40 IU/L   ALT 18 0 - 44 IU/L  CBC with Differential/Platelet  Result Value Ref Range   WBC 10.7 3.4 - 10.8 x10E3/uL   RBC 4.64 4.14 -  5.80 x10E6/uL   Hemoglobin 14.3 13.0 - 17.7 g/dL   Hematocrit 41.6 37.5 - 51.0 %   MCV 90 79 - 97 fL   MCH 30.8 26.6 - 33.0 pg   MCHC 34.4 31.5 - 35.7 g/dL   RDW 12.9 11.6 - 15.4 %   Platelets 163 150 - 450 x10E3/uL   Neutrophils 65 Not Estab. %   Lymphs 21 Not Estab. %   Monocytes 9 Not Estab. %   Eos 3 Not Estab. %   Basos 1 Not Estab. %   Neutrophils Absolute 7.1 (H) 1.4 - 7.0 x10E3/uL   Lymphocytes Absolute 2.2 0.7 - 3.1 x10E3/uL   Monocytes Absolute 0.9 0.1 - 0.9 x10E3/uL   EOS (ABSOLUTE) 0.3 0.0 - 0.4 x10E3/uL   Basophils Absolute 0.1 0.0 - 0.2 x10E3/uL   Immature Granulocytes 1 Not Estab. %   Immature Grans (Abs) 0.1 0.0 - 0.1 x10E3/uL  Hgb A1c w/o eAG  Result Value Ref Range   Hgb A1c MFr Bld 8.5 (H) 4.8 - 5.6 %  Specimen status report  Result Value Ref Range   specimen status report Comment     Assessment & Plan:   Problem List Items Addressed This Visit      Cardiovascular and Mediastinum   Acute viral pericarditis - Primary (Chronic)   Relevant Orders   CBC with Differential/Platelet (Completed)     Respiratory   COPD with acute exacerbation (HCC)   Relevant Medications   predniSONE (DELTASONE) 20 MG tablet     Nervous and Auditory   Essential tremor   Relevant Medications   primidone (MYSOLINE)  50 MG tablet    Other Visit Diagnoses    Arthritis       Relevant Medications   HYDROcodone-acetaminophen (NORCO/VICODIN) 5-325 MG tablet   predniSONE (DELTASONE) 20 MG tablet   Other Relevant Orders   ToxASSURE Select 13 (MW), Urine (Completed)   CMP14+EGFR (Completed)      Refill for primidone Follow up plan: Return in about 3 months (around 01/11/2019), or if symptoms worsen or fail to improve, for Recheck arthritis and chest pain.  Counseling provided for all of the vaccine components Orders Placed This Encounter  Procedures  . ToxASSURE Select 13 (MW), Urine  . CMP14+EGFR  . CBC with Differential/Platelet  . Hgb A1c w/o eAG  . Specimen status  report    Caryl Pina, MD [facility] 10/16/2018, 10:43 PM

## 2018-10-12 DIAGNOSIS — B351 Tinea unguium: Secondary | ICD-10-CM | POA: Diagnosis not present

## 2018-10-12 DIAGNOSIS — M79676 Pain in unspecified toe(s): Secondary | ICD-10-CM | POA: Diagnosis not present

## 2018-10-12 DIAGNOSIS — E1142 Type 2 diabetes mellitus with diabetic polyneuropathy: Secondary | ICD-10-CM | POA: Diagnosis not present

## 2018-10-12 DIAGNOSIS — L84 Corns and callosities: Secondary | ICD-10-CM | POA: Diagnosis not present

## 2018-10-12 LAB — CBC WITH DIFFERENTIAL/PLATELET
Basophils Absolute: 0.1 10*3/uL (ref 0.0–0.2)
Basos: 1 %
EOS (ABSOLUTE): 0.3 10*3/uL (ref 0.0–0.4)
Eos: 3 %
Hematocrit: 41.6 % (ref 37.5–51.0)
Hemoglobin: 14.3 g/dL (ref 13.0–17.7)
Immature Grans (Abs): 0.1 10*3/uL (ref 0.0–0.1)
Immature Granulocytes: 1 %
Lymphocytes Absolute: 2.2 10*3/uL (ref 0.7–3.1)
Lymphs: 21 %
MCH: 30.8 pg (ref 26.6–33.0)
MCHC: 34.4 g/dL (ref 31.5–35.7)
MCV: 90 fL (ref 79–97)
Monocytes Absolute: 0.9 10*3/uL (ref 0.1–0.9)
Monocytes: 9 %
Neutrophils Absolute: 7.1 10*3/uL — ABNORMAL HIGH (ref 1.4–7.0)
Neutrophils: 65 %
Platelets: 163 10*3/uL (ref 150–450)
RBC: 4.64 x10E6/uL (ref 4.14–5.80)
RDW: 12.9 % (ref 11.6–15.4)
WBC: 10.7 10*3/uL (ref 3.4–10.8)

## 2018-10-12 LAB — CMP14+EGFR
ALT: 18 IU/L (ref 0–44)
AST: 8 IU/L (ref 0–40)
Albumin/Globulin Ratio: 1.2 (ref 1.2–2.2)
Albumin: 3.8 g/dL (ref 3.8–4.8)
Alkaline Phosphatase: 73 IU/L (ref 39–117)
BUN/Creatinine Ratio: 17 (ref 10–24)
BUN: 14 mg/dL (ref 8–27)
Bilirubin Total: <0.2 mg/dL (ref 0.0–1.2)
CO2: 23 mmol/L (ref 20–29)
Calcium: 9.8 mg/dL (ref 8.6–10.2)
Chloride: 98 mmol/L (ref 96–106)
Creatinine, Ser: 0.83 mg/dL (ref 0.76–1.27)
GFR calc Af Amer: 105 mL/min/{1.73_m2} (ref >59)
GFR calc non Af Amer: 90 mL/min/{1.73_m2} (ref >59)
Globulin, Total: 3.2 g/dL (ref 1.5–4.5)
Glucose: 164 mg/dL — ABNORMAL HIGH (ref 65–99)
Potassium: 5 mmol/L (ref 3.5–5.2)
Sodium: 137 mmol/L (ref 134–144)
Total Protein: 7 g/dL (ref 6.0–8.5)

## 2018-10-13 LAB — HGB A1C W/O EAG: Hgb A1c MFr Bld: 8.5 % — ABNORMAL HIGH (ref 4.8–5.6)

## 2018-10-13 LAB — SPECIMEN STATUS REPORT

## 2018-10-15 LAB — TOXASSURE SELECT 13 (MW), URINE

## 2018-10-17 ENCOUNTER — Other Ambulatory Visit: Payer: Self-pay | Admitting: Pediatrics

## 2018-10-17 DIAGNOSIS — R399 Unspecified symptoms and signs involving the genitourinary system: Secondary | ICD-10-CM

## 2018-10-30 ENCOUNTER — Other Ambulatory Visit: Payer: Self-pay | Admitting: Family Medicine

## 2018-10-30 DIAGNOSIS — K219 Gastro-esophageal reflux disease without esophagitis: Secondary | ICD-10-CM

## 2018-10-31 ENCOUNTER — Other Ambulatory Visit: Payer: Self-pay

## 2018-10-31 ENCOUNTER — Encounter: Payer: Medicare Other | Attending: Family Medicine | Admitting: Nutrition

## 2018-10-31 ENCOUNTER — Encounter: Payer: Self-pay | Admitting: Nutrition

## 2018-10-31 DIAGNOSIS — E118 Type 2 diabetes mellitus with unspecified complications: Secondary | ICD-10-CM | POA: Insufficient documentation

## 2018-10-31 DIAGNOSIS — IMO0002 Reserved for concepts with insufficient information to code with codable children: Secondary | ICD-10-CM

## 2018-10-31 DIAGNOSIS — E1165 Type 2 diabetes mellitus with hyperglycemia: Secondary | ICD-10-CM | POA: Insufficient documentation

## 2018-10-31 NOTE — Progress Notes (Signed)
Medical Nutrition Therapy:  Appt start time: 8676 end time:  1045.   Assessment:  Primary concerns today: Diabetes Type 2. PMH Heart Attack with stents and angioplasty.Had been in the hospital a few weeks ago due pericarditis.Marland KitchenHas been on steroids. 190 lbs. He notes BS in am are in the 200's and in evening 170's. Just about done with his steroids. Feeling better slowly. Financially limited to access to fresh fruits and vegetables. Doesn't like to cook. Eats 2 meals per day. Doesn't eat much breakfast. Willing to make changes on increasing lower carb vegetables. Drinks water. Walks around some. A1C up to to 8.5% from pericarditis and steroids. Still on Januvia, Jardiance and Metformin.  Willing to work on eating more lower carb vegetables.   Wt Readings from Last 3 Encounters:  10/11/18 190 lb 3.2 oz (86.3 kg)  09/20/18 198 lb 6.4 oz (90 kg)  09/12/18 200 lb (90.7 kg)   Ht Readings from Last 3 Encounters:  10/11/18 6' 3.5" (1.918 m)  09/20/18 6' 3.5" (1.918 m)  09/12/18 6\' 3"  (1.905 m)   There is no height or weight on file to calculate BMI. @BMIFA @ Facility age limit for growth percentiles is 20 years. Facility age limit for growth percentiles is 20 years.  Lab Results  Component Value Date   HGBA1C 8.5 (H) 10/11/2018   CMP Latest Ref Rng & Units 10/11/2018 09/11/2018 09/05/2018  Glucose 65 - 99 mg/dL 164(H) 168(H) 141(H)  BUN 8 - 27 mg/dL 14 14 15   Creatinine 0.76 - 1.27 mg/dL 0.83 0.97 0.84  Sodium 134 - 144 mmol/L 137 134(L) 138  Potassium 3.5 - 5.2 mmol/L 5.0 4.2 4.0  Chloride 96 - 106 mmol/L 98 101 107  CO2 20 - 29 mmol/L 23 20(L) 20(L)  Calcium 8.6 - 10.2 mg/dL 9.8 9.2 9.0  Total Protein 6.0 - 8.5 g/dL 7.0 - -  Total Bilirubin 0.0 - 1.2 mg/dL <0.2 - -  Alkaline Phos 39 - 117 IU/L 73 - -  AST 0 - 40 IU/L 8 - -  ALT 0 - 44 IU/L 18 - -   Lipid Panel     Component Value Date/Time   CHOL 95 09/04/2018 1029   CHOL 124 07/24/2018 1440   TRIG 56 09/04/2018 1029   HDL 45  09/04/2018 1029   HDL 39 (L) 07/24/2018 1440   CHOLHDL 2.1 09/04/2018 1029   VLDL 11 09/04/2018 1029   LDLCALC 39 09/04/2018 1029   LDLCALC 50 07/24/2018 1440     Preferred Learning Style:    No preference indicated    Learning Readiness:   Ready  Change in progress   MEDICATIONS:   DIETARY INTAKE: Breakfast: oatmeal cookie, or oatmeal. 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  Estimated energy needs: 1800-2000  calories 200  g carbohydrates 135  g protein 50 g fat  Progress Towards Goal(s):  In progress.   Nutritional Diagnosis:  NB-1.1 Food and nutrition-related knowledge deficit As related to Diabetes.  As evidenced by A1C 7.7%.    Intervention:  Nutrition and Diabetes education provided on My Plate, CHO counting, meal planning, portion sizes, timing of meals, avoiding snacks between meals unless having a low blood sugar, target ranges for A1C and blood sugars, signs/symptoms and treatment of hyper/hypoglycemia, monitoring blood sugars, taking medications as prescribed, benefits of exercising 30 minutes per day and prevention of complications of DM.   Goals Increase lower  carb vegetables. Drink more water  Eat yogurt daily Eat eggs and oatmeal for breakfast instead of oatmeal cookie. Get A1C to 7% or less.  Teaching Method Utilized:  Visual Auditory Hands on  Handouts given during visit include:  The Plate Method  Meal Plan Card   Barriers to learning/adherence to lifestyle change: none  Demonstrated degree of understanding via:  Teach Back   Monitoring/Evaluation:  Dietary intake, exercise, meal planning, and body weight in 3-4 month(s).

## 2018-10-31 NOTE — Patient Instructions (Addendum)
Goals Increase lower carb vegetables. Drink more water  Eat yogurt daily Eat eggs and oatmeal for breakfast instead of oatmeal cookie. Get A1C to 7% or less.

## 2018-11-15 ENCOUNTER — Ambulatory Visit: Payer: Medicare Other | Admitting: Cardiology

## 2018-11-29 ENCOUNTER — Other Ambulatory Visit (HOSPITAL_COMMUNITY)
Admission: RE | Admit: 2018-11-29 | Discharge: 2018-11-29 | Disposition: A | Discharge status: Home / Self Care | Payer: Medicare Other | Source: Ambulatory Visit | Attending: Urology | Admitting: Urology

## 2018-11-29 ENCOUNTER — Ambulatory Visit (INDEPENDENT_AMBULATORY_CARE_PROVIDER_SITE_OTHER): Payer: Medicare Other | Admitting: Urology

## 2018-11-29 DIAGNOSIS — C671 Malignant neoplasm of dome of bladder: Secondary | ICD-10-CM | POA: Diagnosis not present

## 2018-12-01 ENCOUNTER — Ambulatory Visit (INDEPENDENT_AMBULATORY_CARE_PROVIDER_SITE_OTHER): Payer: Medicare Other | Admitting: Family Medicine

## 2018-12-01 ENCOUNTER — Encounter: Payer: Self-pay | Admitting: Family Medicine

## 2018-12-01 DIAGNOSIS — M545 Low back pain, unspecified: Secondary | ICD-10-CM

## 2018-12-01 DIAGNOSIS — C671 Malignant neoplasm of dome of bladder: Secondary | ICD-10-CM | POA: Diagnosis not present

## 2018-12-01 MED ORDER — DICLOFENAC SODIUM 1 % TD GEL: 2.0000 g | Freq: Four times a day (QID) | TRANSDERMAL | 0 refills | Status: AC

## 2018-12-01 MED ORDER — PREDNISONE 20 MG PO TABS: ORAL_TABLET | ORAL | 0 refills | Status: DC

## 2018-12-01 NOTE — Progress Notes (Signed)
Virtual Visit via telephone Note Due to COVID-19, visit is conducted virtually and was requested by patient. This visit type was conducted due to national recommendations for restrictions regarding the COVID-19 Pandemic (e.g. social distancing) in an effort to limit this patient's exposure and mitigate transmission in our community. All issues noted in this document were discussed and addressed.  A physical exam was not performed with this format.   I connected with Andrew [name] on 12/01/18 at 1415 by telephone and verified that I am speaking with the correct person using two identifiers. Andrew [name] is currently located at home and no one is currently with them during visit. The provider, Monia Pouch, FNP is located in their office at time of visit.  I discussed the limitations, risks, security and privacy concerns of performing an evaluation and management service by telephone and the availability of in person appointments. I also discussed with the patient that there may be a patient responsible charge related to this service. The patient expressed understanding and agreed to proceed.  Subjective:  Patient ID: Andrew [name], male    DOB: 10-15-1950, 68 y.o.   MRN: [id number]  Chief Complaint:  Back Pain (lower left)   HPI: Andrew [name] is a 68 y.o. male presenting on 12/01/2018 for Back Pain (lower left)   Pt states he was working in the garden two days ago and overdid it. States he is now having pain and stiffness in his left lower back.   Back Pain This is a new problem. The current episode started in the past 7 days. The problem occurs constantly. The problem has been waxing and waning since onset. The pain is present in the lumbar spine (left). The pain does not radiate. The pain is at a severity of 6/10. The pain is moderate. The pain is worse during the day. The symptoms are aggravated by bending, position, twisting and standing. Stiffness is present all day. Pertinent negatives  include no abdominal pain, bladder incontinence, bowel incontinence, chest pain, dysuria, fever, headaches, leg pain, numbness, paresis, paresthesias, pelvic pain, perianal numbness, tingling, weakness or weight loss. Risk factors include history of cancer. Treatments tried: Lortab. The treatment provided moderate relief.     Relevant past medical, surgical, family, and social history reviewed and updated as indicated.  Allergies and medications reviewed and updated.   Past Medical History:  Diagnosis Date  . Acute viral pericarditis 09/03/2018   Inferior STE - but Negative Troponin.  CP &SOB.  Felt to be Viral.  . Anemia   . Arthritis   . Bladder cancer ([place])   . Depression   . Diabetes mellitus without complication ([place])   . Essential hypertension 01/20/2018   in the past no longer on medication  . GERD (gastroesophageal reflux disease)   . Hyperlipidemia with target LDL less than 70 12/19/2015  . Multivessel CAD - CTO dLAD, mCx. DES PCI RI, PTCA of RPDA 01/21/2018   12/2017 - Cath for ? STEMI -> distal/apical LAD & m-dCx CTO (unable to cross Cx).  Mod rPDA. Severe RI - DES PCI.  08/2018 - Cath for ? Inf STEMI - RI stent patent & CTO dLAD/mCx. Progression of rPDA 95% -> PTCA only.  Thought to be Pericarditis & not MI (troponin negative).   . Neuromuscular disorder ([place])   . Sleep apnea    BiPAP not currently being used  . Status post tendon repair 1989  . STEMI (ST elevation myocardial infarction) ([place]) 01/20/2018   Cardiac cath January 21, 2018: Severe multivessel disease with unclear lesion, but opted for PCI/DES x1 to the RI.  Appeared to have CTO of the distal LAD and circumflex as well as moderate mid LAD and diagonal disease as well as PDA.Marland Kitchen  Unable to cross circumflex lesion.  . Stroke ([place])    At times pt has dizziness with loss of vision  . Tremors of nervous system 2011    Past Surgical History:  Procedure Laterality Date  . APPENDECTOMY    . BLADDER SURGERY    .  COLONOSCOPY    . CORONARY STENT INTERVENTION N/A 01/21/2018   Procedure: CORONARY STENT INTERVENTION;  Surgeon: Leonie Man, MD;  Location: [facility];  Service: Cardiovascular;  Laterality: N/A;  95% Ramus Intermedius -PCI with synergy DES 2.25 mm x 12 mm postdilated 2.4 mm.  [place] ACUTE MI REVASCULARIZATION N/A 01/21/2018   Procedure: Coronary/Graft Acute MI Revascularization;  Surgeon: Leonie Man, MD;  Location: [facility];  Service: Cardiovascular;  Laterality: N/A;  attempted revas to distal CFX;   . CORONARY/GRAFT ACUTE MI REVASCULARIZATION N/A 09/04/2018   Procedure: Coronary/Graft Acute MI Revascularization;  Surgeon: Sherren Mocha, MD;  Location: [facility]:: PTCA/POBA of rPDA (2.0 mm balloon)  . CYSTOSCOPY W/ RETROGRADES Bilateral 08/22/2015   Procedure: CYSTOSCOPY WITH RETROGRADE PYELOGRAM;  Surgeon: Irine Seal, MD;  Location: [facility];  Service: Urology;  Laterality: Bilateral;  . CYSTOSCOPY W/ RETROGRADES Bilateral 01/16/2016   Procedure: CYSTOSCOPY WITH RETROGRADE PYELOGRAM;  Surgeon: Irine Seal, MD;  Location: [facility];  Service: Urology;  Laterality: Bilateral;  . CYSTOSCOPY W/ RETROGRADES Bilateral 01/07/2017   Procedure: CYSTOSCOPY WITH RETROGRADE PYELOGRAM;  Surgeon: Irine Seal, MD;  Location: [facility];  Service: Urology;  Laterality: Bilateral;  . CYSTOSCOPY WITH BIOPSY N/A 08/22/2015   Procedure: CYSTOSCOPY WITH BLADDER BIOPSY;  Surgeon: Irine Seal, MD;  Location: [facility];  Service: Urology;  Laterality: N/A;  . CYSTOSCOPY WITH BIOPSY N/A 01/16/2016   Procedure: CYSTOSCOPY WITH BLADDER BIOPSY;  Surgeon: Irine Seal, MD;  Location: [facility];  Service: Urology;  Laterality: N/A;  . CYSTOSCOPY WITH FULGERATION N/A 01/16/2016   Procedure: CYSTOSCOPY WITH FULGERATION;  Surgeon: Irine Seal, MD;  Location: [facility];  Service: Urology;  Laterality: N/A;  . KNEE ARTHROSCOPY Right 1989  . LEFT HEART CATH AND CORONARY ANGIOGRAPHY N/A 01/21/2018   Procedure:  LEFT HEART CATH AND CORONARY ANGIOGRAPHY;  Surgeon: Leonie Man, MD;  Location: [facility];  Service: Cardiovascular;; 95% RI-DES PCI.  80% o-pCX (PTCA) -> dCX 80%-100% CTO (unsuccessful PTCA unable to cross distal lesion with balloon.)  100% CTO dLAD. RPDA 80% and 70% as well as P AV 180%, PA V2 60%.  (too small for PCI)  . LEFT HEART CATH AND CORONARY ANGIOGRAPHY N/A 09/04/2018   Procedure: LEFT HEART CATH AND CORONARY ANGIOGRAPHY;  Surgeon: Sherren Mocha, MD;  Location: [facility]: (presumed Inferior STEMI) = progression of small rPDA -95% (PTCA only). Patent RI stent. CTO of apical LAD & mCx.  Marland Kitchen ROTATOR CUFF REPAIR Bilateral 2000  . rt. leg fracture surgery    . TRANSTHORACIC ECHOCARDIOGRAM  01/21/2018   Mild LVH.  EF 50 and 55%.  Apical inferior hypokinesis.  Mid-apical anterolateral hypokinesis.  Normal diastolic function for age   . TRANSTHORACIC ECHOCARDIOGRAM  09/04/2018   -? Inf STEMI vs. Pericarditis:  Mildly reduced EF of 45 to 50%.  Impaired relaxation-GR 1 DD.  Severe hypokinesis of the anterolateral and inferolateral wall.  Small-moderate anterior pericardial effusion.  Moderate aortic sclerosis but no stenosis.  . TRANSURETHRAL RESECTION OF BLADDER TUMOR N/A 01/07/2017   Procedure: TRANSURETHRAL RESECTION OF BLADDER TUMOR (TURBT);  Surgeon: Irine Seal, MD;  Location: [facility];  Service: Urology;  Laterality: N/A;  . WISDOM TOOTH EXTRACTION      Social History   Socioeconomic History  . Marital status: Divorced    Spouse name: Not on file  . Number of children: 1  . Years of education: some college  . Highest education level: Some college, no degree  Occupational History  . Occupation: retired    Comment: Architect, some farming  Social Needs  . Financial resource strain: Not hard at all  . Food insecurity    Worry: Never true    Inability: Never true  . Transportation needs    Medical: No    Non-medical: No  Tobacco Use  . Smoking status:  Former Smoker    Packs/day: 0.50    Years: 42.00    Pack years: 21.00    Types: Cigarettes    Quit date: 12/2017    Years since quitting: 0.9  . Smokeless tobacco: Never Used  Substance and Sexual Activity  . Alcohol use: No    Comment: rarely  . Drug use: No  . Sexual activity: Never  Lifestyle  . Physical activity    Days per week: 5 days    Minutes per session: 90 min  . Stress: Not at all  Relationships  . Social connections    Talks on phone: More than three times a week    Gets together: Twice a week    Attends religious service: Never    Active member of club or organization: No    Attends meetings of clubs or organizations: Never    Relationship status: Divorced  . Intimate partner violence    Fear of current or ex partner: No    Emotionally abused: No    Physically abused: No    Forced sexual activity: No  Other Topics Concern  . Not on file  Social History Narrative  . Not on file    Outpatient Encounter Medications as of 12/01/2018  Medication Sig  . aspirin 81 MG tablet Take 81 mg by mouth daily.  . bisoprolol (ZEBETA) 5 MG tablet Take 0.5 tablets (2.5 mg total) by mouth daily. Take at 2pm  . clopidogrel (PLAVIX) 75 MG tablet Take 1 tablet (75 mg total) by mouth daily.  . colchicine 0.6 MG tablet Take 1 tablet (0.6 mg total) by mouth 2 (two) times daily.  . diclofenac sodium (VOLTAREN) 1 % GEL Apply 2 g topically 4 (four) times daily for 14 days.  . empagliflozin (JARDIANCE) 25 MG TABS tablet Take 25 mg by mouth daily.  . fenofibrate (TRICOR) 48 MG tablet Take 1 tablet (48 mg total) by mouth daily.  Marland Kitchen HYDROcodone-acetaminophen (NORCO/VICODIN) 5-325 MG tablet Take 1 tablet by mouth every 8 (eight) hours as needed for moderate pain or severe pain.  . isosorbide mononitrate (IMDUR) 30 MG 24 hr tablet Take 1 tablet (30 mg total) by mouth daily.  Marland Kitchen lisinopril (PRINIVIL,ZESTRIL) 5 MG tablet Take 1 tablet (5 mg total) by mouth daily.  . metFORMIN (GLUCOPHAGE) 1000  MG tablet Take 1 tablet by mouth 2 times daily with a meal.  . nitroGLYCERIN (NITROSTAT) 0.4 MG SL tablet Place 1 tablet (0.4 mg total) under the tongue every 5 (five) minutes as needed for chest pain.  Marland Kitchen omeprazole (PRILOSEC) 20  MG capsule TAKE (1) CAPSULE DAILY  . oxyCODONE-acetaminophen (PERCOCET/ROXICET) 5-325 MG tablet Take 2 tablets by mouth every 4 (four) hours as needed for severe pain.  . predniSONE (DELTASONE) 20 MG tablet 2 po at sametime daily for 5 days  . primidone (MYSOLINE) 50 MG tablet Take 1 tablet (50 mg total) by mouth 4 (four) times daily.  . rosuvastatin (CRESTOR) 40 MG tablet Take 1 tablet (40 mg total) by mouth daily.  . sitaGLIPtin (JANUVIA) 100 MG tablet Take 1 tablet (100 mg total) by mouth daily.  . tamsulosin (FLOMAX) 0.4 MG CAPS capsule Take 1 capsule (0.4 mg total) by mouth daily.  . [DISCONTINUED] predniSONE (DELTASONE) 20 MG tablet Take 3 tabs daily for 1 week, then 2 tabs daily for week 2, then 1 tab daily for week 3.   No facility-administered encounter medications on file as of 12/01/2018.     Allergies  Allergen Reactions  . Tramadol Shortness Of Breath    And dizziness  . Trulicity [Dulaglutide] Swelling    Ankles and feet swell    Review of Systems  Constitutional: Negative for activity change, appetite change, chills, diaphoresis, fatigue, fever, unexpected weight change and weight loss.  Respiratory: Negative for cough and shortness of breath.   Cardiovascular: Negative for chest pain, palpitations and leg swelling.  Gastrointestinal: Negative for abdominal pain and bowel incontinence.  Genitourinary: Negative for bladder incontinence, decreased urine volume, difficulty urinating, dysuria and pelvic pain.  Musculoskeletal: Positive for back pain. Negative for arthralgias, gait problem, joint swelling, myalgias, neck pain and neck stiffness.  Neurological: Negative for dizziness, tingling, tremors, seizures, syncope, facial asymmetry, speech  difficulty, weakness, light-headedness, numbness, headaches and paresthesias.  Psychiatric/Behavioral: Negative for confusion.  All other systems reviewed and are negative.        Observations/Objective: No vital signs or physical exam, this was a telephone or virtual health encounter.  Pt alert and oriented, answers all questions appropriately, and able to speak in full sentences.    Assessment and Plan: Andrew [name] was seen today for back pain.  Diagnoses and all orders for this visit:  Acute left-sided low back pain without sciatica  Reported symptoms consistent with lumbar strain after overuse. No red flags for cauda equina syndrome. Does have a history of bladder CA but this was an acute onset after working in garden. No weight loss, fever, chills, weakness, or diaphoresis. Symptomatic care discussed. Can continue Lortab as needed. Will do burst of steroids. Topical NSAID. Strengthening and stretching exercises discussed. Report any new or worsening symptoms. Follow up in 4-6 weeks or sooner if needed.  -     predniSONE (DELTASONE) 20 MG tablet; 2 po at sametime daily for 5 days -     diclofenac sodium (VOLTAREN) 1 % GEL; Apply 2 g topically 4 (four) times daily for 14 days.     Follow Up Instructions: Return in about 4 weeks (around 12/29/2018), or if symptoms worsen or fail to improve, for low back pain.    I discussed the assessment and treatment plan with the patient. The patient was provided an opportunity to ask questions and all were answered. The patient agreed with the plan and demonstrated an understanding of the instructions.   The patient was advised to call back or seek an in-person evaluation if the symptoms worsen or if the condition fails to improve as anticipated.  The above assessment and management plan was discussed with the patient. The patient verbalized understanding of and has agreed to the management  plan. Patient is aware to call the clinic if symptoms persist  or worsen. Patient is aware when to return to the clinic for a follow-up visit. Patient educated on when it is appropriate to go to the emergency department.    I provided 15 minutes of non-face-to-face time during this encounter. The call started at 1415. The call ended at 1430. The other time was used for coordination of care.    Monia Pouch, FNP-C [facility] [address] [place], [place] [address] [phone]

## 2018-12-18 ENCOUNTER — Telehealth: Payer: Self-pay | Admitting: Family Medicine

## 2018-12-18 NOTE — Telephone Encounter (Signed)
He can to [facility] urgent care or ED, we are not seeing any respiratory patient because of covid

## 2018-12-18 NOTE — Telephone Encounter (Signed)
He says he does not have covid and does not want to be tested!   Beginning last Friday,  he has had heavy feeling in chest with some cough and sputum.  No fever or other symptoms.  He wants Dr. Warrick Parisian to send in medicine to help with this.  He will not consider going to emergency department either.

## 2018-12-19 NOTE — Telephone Encounter (Signed)
Patient aware and verbalizes understanding. 

## 2018-12-21 DIAGNOSIS — E1151 Type 2 diabetes mellitus with diabetic peripheral angiopathy without gangrene: Secondary | ICD-10-CM | POA: Diagnosis not present

## 2018-12-21 DIAGNOSIS — M79676 Pain in unspecified toe(s): Secondary | ICD-10-CM | POA: Diagnosis not present

## 2018-12-21 DIAGNOSIS — B351 Tinea unguium: Secondary | ICD-10-CM | POA: Diagnosis not present

## 2018-12-21 DIAGNOSIS — L84 Corns and callosities: Secondary | ICD-10-CM | POA: Diagnosis not present

## 2019-01-09 ENCOUNTER — Telehealth: Payer: Self-pay | Admitting: Cardiology

## 2019-01-09 NOTE — Telephone Encounter (Signed)
New message:     Patient calling concering some medications and would like for some one to call.

## 2019-01-09 NOTE — Telephone Encounter (Signed)
Returned call to pt he states that he ran out last Friday of his colchicine. He states that this is for an infection in my chest. He is asking if this is a medication that he must take daily or if this is only to be taken when he has a flare up? Please advise.

## 2019-01-10 ENCOUNTER — Other Ambulatory Visit: Payer: Self-pay

## 2019-01-10 NOTE — Telephone Encounter (Signed)
That was planned to only be a 3 month course - no refills.   As long as no flare up - he does not need to stay on daily for long term.  Glenetta Hew, MD

## 2019-01-10 NOTE — Telephone Encounter (Signed)
Pt notified he will CB should/when sx return

## 2019-01-11 ENCOUNTER — Encounter: Payer: Self-pay | Admitting: Family Medicine

## 2019-01-11 ENCOUNTER — Ambulatory Visit (INDEPENDENT_AMBULATORY_CARE_PROVIDER_SITE_OTHER): Payer: Medicare Other | Admitting: Family Medicine

## 2019-01-11 VITALS — BP 99/60 | HR 84 | Temp 98.7°F | Ht 75.5 in | Wt 192.8 lb

## 2019-01-11 DIAGNOSIS — K219 Gastro-esophageal reflux disease without esophagitis: Secondary | ICD-10-CM

## 2019-01-11 DIAGNOSIS — E785 Hyperlipidemia, unspecified: Secondary | ICD-10-CM | POA: Diagnosis not present

## 2019-01-11 DIAGNOSIS — I10 Essential (primary) hypertension: Secondary | ICD-10-CM

## 2019-01-11 DIAGNOSIS — I208 Other forms of angina pectoris: Secondary | ICD-10-CM

## 2019-01-11 DIAGNOSIS — E119 Type 2 diabetes mellitus without complications: Secondary | ICD-10-CM

## 2019-01-11 DIAGNOSIS — E1169 Type 2 diabetes mellitus with other specified complication: Secondary | ICD-10-CM | POA: Diagnosis not present

## 2019-01-11 DIAGNOSIS — R809 Proteinuria, unspecified: Secondary | ICD-10-CM | POA: Diagnosis not present

## 2019-01-11 DIAGNOSIS — R399 Unspecified symptoms and signs involving the genitourinary system: Secondary | ICD-10-CM

## 2019-01-11 LAB — BAYER DCA HB A1C WAIVED: HB A1C (BAYER DCA - WAIVED): 7.3 % — ABNORMAL HIGH (ref <7.0)

## 2019-01-11 MED ORDER — SITAGLIPTIN PHOSPHATE 100 MG PO TABS: 100.0000 mg | ORAL_TABLET | Freq: Every day | ORAL | 5 refills | Status: DC

## 2019-01-11 MED ORDER — BISOPROLOL FUMARATE 5 MG PO TABS: 2.5000 mg | ORAL_TABLET | Freq: Every day | ORAL | 3 refills | Status: DC

## 2019-01-11 MED ORDER — LISINOPRIL 5 MG PO TABS: 2.5000 mg | ORAL_TABLET | Freq: Every day | ORAL | 3 refills | Status: DC

## 2019-01-11 MED ORDER — TAMSULOSIN HCL 0.4 MG PO CAPS: 0.4000 mg | ORAL_CAPSULE | Freq: Every day | ORAL | 0 refills | Status: DC

## 2019-01-11 MED ORDER — NITROGLYCERIN 0.4 MG SL SUBL: 0.4000 mg | SUBLINGUAL_TABLET | SUBLINGUAL | 2 refills | Status: DC | PRN

## 2019-01-11 NOTE — Patient Instructions (Signed)
Continue current medications, d/c asa and colchicine per cards and cut lisinopril in half may need to cut isosorbide in half in the future, speak with cards

## 2019-01-11 NOTE — Progress Notes (Signed)
BP 99/60   Pulse 84   Temp 98.7 F (37.1 C) (Temporal)   Ht 6' 3.5" (1.918 m)   Wt 192 lb 12.8 oz (87.5 kg)   BMI 23.78 kg/m    Subjective:   Patient ID: Andrew [name], male    DOB: 10/18/1950, 68 y.o.   MRN: [id number]  HPI: Andrew [name] is a 68 y.o. male presenting on 01/11/2019 for Diabetes (3 month follow up), Fall (Patient states that he fell the other day.), and vision off   HPI Type 2 diabetes mellitus Patient comes in today for recheck of his diabetes. Patient has been currently taking metformin and Januvia and Jardiance. Patient is currently on an ACE inhibitor/ARB. Patient has not seen an ophthalmologist this year. Patient denies any issues with their feet.   Hypertension Patient is currently on lisinopril and bisoprolol and isosorbide mononitrate, and their blood pressure today is 99/60. Patient denies any lightheadedness or dizziness. Patient denies headaches, blurred vision, chest pains, shortness of breath, or weakness. Denies any side effects from medication and is content with current medication.   Hyperlipidemia Patient is coming in for recheck of his hyperlipidemia. The patient is currently taking fenofibrate. They deny any issues with myalgias or history of liver damage from it. They deny any focal numbness or weakness or chest pain.   Relevant past medical, surgical, family and social history reviewed and updated as indicated. Interim medical history since our last visit reviewed. Allergies and medications reviewed and updated.  Review of Systems  Constitutional: Negative for chills and fever.  Eyes: Positive for visual disturbance.  Respiratory: Negative for shortness of breath and wheezing.   Cardiovascular: Negative for chest pain and leg swelling.  Musculoskeletal: Negative for back pain and gait problem.  Skin: Negative for rash.  Neurological: Negative for dizziness, weakness and light-headedness.  All other systems reviewed and are negative.   Per  HPI unless specifically indicated above   Allergies as of 01/11/2019      Reactions   Tramadol Shortness Of Breath   And dizziness   Trulicity [dulaglutide] Swelling   Ankles and feet swell      Medication List       Accurate as of January 11, 2019  1:59 PM. If you have any questions, ask your nurse or doctor.        STOP taking these medications   aspirin 81 MG tablet Stopped by: Worthy Rancher, MD   colchicine 0.6 MG tablet Stopped by: Worthy Rancher, MD   oxyCODONE-acetaminophen 5-325 MG tablet Commonly known as: PERCOCET/ROXICET Stopped by: Worthy Rancher, MD   predniSONE 20 MG tablet Commonly known as: Deltasone Stopped by: Worthy Rancher, MD     TAKE these medications   bisoprolol 5 MG tablet Commonly known as: ZEBETA Take 0.5 tablets (2.5 mg total) by mouth daily. Take at 2pm   clopidogrel 75 MG tablet Commonly known as: Plavix Take 1 tablet (75 mg total) by mouth daily.   empagliflozin 25 MG Tabs tablet Commonly known as: JARDIANCE Take 25 mg by mouth daily.   fenofibrate 48 MG tablet Commonly known as: Tricor Take 1 tablet (48 mg total) by mouth daily.   HYDROcodone-acetaminophen 5-325 MG tablet Commonly known as: NORCO/VICODIN Take 1 tablet by mouth every 8 (eight) hours as needed for moderate pain or severe pain.   isosorbide mononitrate 30 MG 24 hr tablet Commonly known as: IMDUR Take 1 tablet (30 mg total) by mouth daily.   lisinopril 5  MG tablet Commonly known as: ZESTRIL Take 0.5 tablets (2.5 mg total) by mouth daily. What changed: how much to take Changed by: Worthy Rancher, MD   metFORMIN 1000 MG tablet Commonly known as: GLUCOPHAGE Take 1 tablet by mouth 2 times daily with a meal.   nitroGLYCERIN 0.4 MG SL tablet Commonly known as: NITROSTAT Place 1 tablet (0.4 mg total) under the tongue every 5 (five) minutes as needed for chest pain.   omeprazole 20 MG capsule Commonly known as: PRILOSEC TAKE (1) CAPSULE  DAILY   primidone 50 MG tablet Commonly known as: MYSOLINE Take 1 tablet (50 mg total) by mouth 4 (four) times daily.   rosuvastatin 40 MG tablet Commonly known as: CRESTOR Take 1 tablet (40 mg total) by mouth daily.   sitaGLIPtin 100 MG tablet Commonly known as: JANUVIA Take 1 tablet (100 mg total) by mouth daily.   tamsulosin 0.4 MG Caps capsule Commonly known as: FLOMAX Take 1 capsule (0.4 mg total) by mouth daily.        Objective:   BP 99/60   Pulse 84   Temp 98.7 F (37.1 C) (Temporal)   Ht 6' 3.5" (1.918 m)   Wt 192 lb 12.8 oz (87.5 kg)   BMI 23.78 kg/m   Wt Readings from Last 3 Encounters:  01/11/19 192 lb 12.8 oz (87.5 kg)  10/11/18 190 lb 3.2 oz (86.3 kg)  09/20/18 198 lb 6.4 oz (90 kg)    Physical Exam Vitals signs and nursing note reviewed.  Constitutional:      General: He is not in acute distress.    Appearance: He is well-developed. He is not diaphoretic.  Eyes:     General: No scleral icterus.    Conjunctiva/sclera: Conjunctivae normal.  Neck:     Musculoskeletal: Neck supple.     Thyroid: No thyromegaly.  Cardiovascular:     Rate and Rhythm: Normal rate and regular rhythm.     Heart sounds: Normal heart sounds. No murmur.  Pulmonary:     Effort: Pulmonary effort is normal. No respiratory distress.     Breath sounds: Normal breath sounds. No wheezing.  Musculoskeletal: Normal range of motion.  Lymphadenopathy:     Cervical: No cervical adenopathy.  Skin:    General: Skin is warm and dry.     Findings: No rash.  Neurological:     Mental Status: He is alert and oriented to person, place, and time.     Coordination: Coordination normal.  Psychiatric:        Behavior: Behavior normal.       Assessment & Plan:   Problem List Items Addressed This Visit      Cardiovascular and Mediastinum   Essential hypertension - Primary (Chronic)   Relevant Medications   nitroGLYCERIN (NITROSTAT) 0.4 MG SL tablet   lisinopril (ZESTRIL) 5 MG  tablet   bisoprolol (ZEBETA) 5 MG tablet     Digestive   Esophageal reflux   Relevant Orders   CBC with Differential/Platelet (Completed)    Other Visit Diagnoses    Microalbuminuria       Relevant Medications   lisinopril (ZESTRIL) 5 MG tablet   Type 2 diabetes mellitus without complication, without long-term current use of insulin (HCC)       Relevant Medications   lisinopril (ZESTRIL) 5 MG tablet   sitaGLIPtin (JANUVIA) 100 MG tablet   Other Relevant Orders   Bayer DCA Hb A1c Waived (Completed)   CBC with Differential/Platelet (Completed)   CMP14+EGFR (  Completed)   Lower urinary tract symptoms       Relevant Medications   tamsulosin (FLOMAX) 0.4 MG CAPS capsule   Hyperlipidemia associated with type 2 diabetes mellitus (HCC)       Relevant Medications   lisinopril (ZESTRIL) 5 MG tablet   sitaGLIPtin (JANUVIA) 100 MG tablet   Other Relevant Orders   Lipid panel (Completed)      Continue current medications, d/c asa and colchicine per cards and cut lisinopril in half may need to cut isosorbide in half in the future, speak with cards Follow up plan: Return in about 3 months (around 04/13/2019), or if symptoms worsen or fail to improve, for diabetes.  Counseling provided for all of the vaccine components No orders of the defined types were placed in this encounter.   Caryl Pina, MD [facility] 01/11/2019, 1:59 PM

## 2019-01-12 ENCOUNTER — Encounter: Payer: Self-pay | Admitting: *Deleted

## 2019-01-12 LAB — CBC WITH DIFFERENTIAL/PLATELET
Basophils Absolute: 0.1 10*3/uL (ref 0.0–0.2)
Basos: 1 %
EOS (ABSOLUTE): 0.2 10*3/uL (ref 0.0–0.4)
Eos: 2 %
Hematocrit: 47 % (ref 37.5–51.0)
Hemoglobin: 15.8 g/dL (ref 13.0–17.7)
Immature Grans (Abs): 0.1 10*3/uL (ref 0.0–0.1)
Immature Granulocytes: 1 %
Lymphocytes Absolute: 2.3 10*3/uL (ref 0.7–3.1)
Lymphs: 25 %
MCH: 31.1 pg (ref 26.6–33.0)
MCHC: 33.6 g/dL (ref 31.5–35.7)
MCV: 93 fL (ref 79–97)
Monocytes Absolute: 0.8 10*3/uL (ref 0.1–0.9)
Monocytes: 9 %
Neutrophils Absolute: 5.9 10*3/uL (ref 1.4–7.0)
Neutrophils: 62 %
RBC: 5.08 x10E6/uL (ref 4.14–5.80)
RDW: 14.3 % (ref 11.6–15.4)
WBC: 9.4 10*3/uL (ref 3.4–10.8)

## 2019-01-12 LAB — CMP14+EGFR
ALT: 6 IU/L (ref 0–44)
AST: 9 IU/L (ref 0–40)
Albumin/Globulin Ratio: 1.7 (ref 1.2–2.2)
Albumin: 4.5 g/dL (ref 3.8–4.8)
Alkaline Phosphatase: 59 IU/L (ref 39–117)
BUN/Creatinine Ratio: 16 (ref 10–24)
BUN: 11 mg/dL (ref 8–27)
Bilirubin Total: 0.4 mg/dL (ref 0.0–1.2)
CO2: 23 mmol/L (ref 20–29)
Calcium: 9.7 mg/dL (ref 8.6–10.2)
Chloride: 99 mmol/L (ref 96–106)
Creatinine, Ser: 0.7 mg/dL — ABNORMAL LOW (ref 0.76–1.27)
GFR calc Af Amer: 112 mL/min/{1.73_m2} (ref >59)
GFR calc non Af Amer: 97 mL/min/{1.73_m2} (ref >59)
Globulin, Total: 2.6 g/dL (ref 1.5–4.5)
Glucose: 129 mg/dL — ABNORMAL HIGH (ref 65–99)
Potassium: 5.3 mmol/L — ABNORMAL HIGH (ref 3.5–5.2)
Sodium: 138 mmol/L (ref 134–144)
Total Protein: 7.1 g/dL (ref 6.0–8.5)

## 2019-01-12 LAB — LIPID PANEL
Chol/HDL Ratio: 2.7 ratio (ref 0.0–5.0)
Cholesterol, Total: 113 mg/dL (ref 100–199)
HDL: 42 mg/dL (ref >39)
LDL Calculated: 48 mg/dL (ref 0–99)
Triglycerides: 115 mg/dL (ref 0–149)
VLDL Cholesterol Cal: 23 mg/dL (ref 5–40)

## 2019-01-22 ENCOUNTER — Other Ambulatory Visit: Payer: Self-pay | Admitting: *Deleted

## 2019-01-22 DIAGNOSIS — R399 Unspecified symptoms and signs involving the genitourinary system: Secondary | ICD-10-CM

## 2019-01-22 MED ORDER — TAMSULOSIN HCL 0.4 MG PO CAPS: 0.4000 mg | ORAL_CAPSULE | Freq: Every day | ORAL | 0 refills | Status: DC

## 2019-01-30 ENCOUNTER — Other Ambulatory Visit: Payer: Self-pay | Admitting: Family Medicine

## 2019-01-30 DIAGNOSIS — K219 Gastro-esophageal reflux disease without esophagitis: Secondary | ICD-10-CM

## 2019-02-16 ENCOUNTER — Ambulatory Visit: Payer: Medicare Other | Admitting: Cardiology

## 2019-02-19 ENCOUNTER — Other Ambulatory Visit: Payer: Self-pay | Admitting: Family Medicine

## 2019-02-19 MED ORDER — FENOFIBRATE 48 MG PO TABS: 48.0000 mg | ORAL_TABLET | Freq: Every day | ORAL | 5 refills | Status: DC

## 2019-02-19 NOTE — Telephone Encounter (Signed)
sent and aware

## 2019-02-22 DIAGNOSIS — L84 Corns and callosities: Secondary | ICD-10-CM | POA: Diagnosis not present

## 2019-02-22 DIAGNOSIS — M79676 Pain in unspecified toe(s): Secondary | ICD-10-CM | POA: Diagnosis not present

## 2019-02-22 DIAGNOSIS — E1151 Type 2 diabetes mellitus with diabetic peripheral angiopathy without gangrene: Secondary | ICD-10-CM | POA: Diagnosis not present

## 2019-02-22 DIAGNOSIS — B351 Tinea unguium: Secondary | ICD-10-CM | POA: Diagnosis not present

## 2019-02-27 ENCOUNTER — Encounter: Payer: Medicare Other | Attending: Family Medicine | Admitting: Nutrition

## 2019-02-27 ENCOUNTER — Other Ambulatory Visit: Payer: Self-pay

## 2019-02-27 DIAGNOSIS — I252 Old myocardial infarction: Secondary | ICD-10-CM | POA: Diagnosis not present

## 2019-02-27 DIAGNOSIS — E118 Type 2 diabetes mellitus with unspecified complications: Secondary | ICD-10-CM | POA: Diagnosis not present

## 2019-02-27 DIAGNOSIS — IMO0002 Reserved for concepts with insufficient information to code with codable children: Secondary | ICD-10-CM

## 2019-02-27 DIAGNOSIS — E1165 Type 2 diabetes mellitus with hyperglycemia: Secondary | ICD-10-CM | POA: Diagnosis not present

## 2019-02-27 NOTE — Progress Notes (Signed)
Medical Nutrition Therapy:  Appt start time: [id number] end time:  1045.   Assessment:  Primary concerns today: Diabetes Type 2. PMH Heart Attack with stents and angioplasty. He is eating more fresh fruits and vegetables when he can afford them. Usually goes to grocery store once a month. Limited finances. Has cut out snacks.  Wt is stable but he doesn't want to lose any more weight. Prefers to weigh 200 lbs. FBS: 150-175 and Eveings120's 130's.  He notes he has problems with his feet and walking some. Foot DR says he doesn't have much padding on his feet. Can't afford orthodics. Still on Januvia, Jardiance and Metfomrin. Looking to change heart doctors.  Lab Results  Component Value Date   HGBA1C 7.3 (H) 01/11/2019   CMP Latest Ref Rng & Units 01/11/2019 10/11/2018 09/11/2018  Glucose 65 - 99 mg/dL 129(H) 164(H) 168(H)  BUN 8 - 27 mg/dL 11 14 14   Creatinine 0.76 - 1.27 mg/dL 0.70(L) 0.83 0.97  Sodium 134 - 144 mmol/L 138 137 134(L)  Potassium 3.5 - 5.2 mmol/L 5.3(H) 5.0 4.2  Chloride 96 - 106 mmol/L 99 98 101  CO2 20 - 29 mmol/L 23 23 20(L)  Calcium 8.6 - 10.2 mg/dL 9.7 9.8 9.2  Total Protein 6.0 - 8.5 g/dL 7.1 7.0 -  Total Bilirubin 0.0 - 1.2 mg/dL 0.4 <0.2 -  Alkaline Phos 39 - 117 IU/L 59 73 -  AST 0 - 40 IU/L 9 8 -  ALT 0 - 44 IU/L 6 18 -   Willing to work on eating more lower carb vegetables.   Wt Readings from Last 3 Encounters:  01/11/19 192 lb 12.8 oz (87.5 kg)  10/11/18 190 lb 3.2 oz (86.3 kg)  09/20/18 198 lb 6.4 oz (90 kg)   Ht Readings from Last 3 Encounters:  01/11/19 6' 3.5" (1.918 m)  10/11/18 6' 3.5" (1.918 m)  09/20/18 6' 3.5" (1.918 m)   There is no height or weight on file to calculate BMI. @BMIFA @ Facility age limit for growth percentiles is 20 years. Facility age limit for growth percentiles is 20 years.  Lab Results  Component Value Date   HGBA1C 7.3 (H) 01/11/2019   CMP Latest Ref Rng & Units 01/11/2019 10/11/2018 09/11/2018  Glucose 65 - 99 mg/dL 129(H)  164(H) 168(H)  BUN 8 - 27 mg/dL 11 14 14   Creatinine 0.76 - 1.27 mg/dL 0.70(L) 0.83 0.97  Sodium 134 - 144 mmol/L 138 137 134(L)  Potassium 3.5 - 5.2 mmol/L 5.3(H) 5.0 4.2  Chloride 96 - 106 mmol/L 99 98 101  CO2 20 - 29 mmol/L 23 23 20(L)  Calcium 8.6 - 10.2 mg/dL 9.7 9.8 9.2  Total Protein 6.0 - 8.5 g/dL 7.1 7.0 -  Total Bilirubin 0.0 - 1.2 mg/dL 0.4 <0.2 -  Alkaline Phos 39 - 117 IU/L 59 73 -  AST 0 - 40 IU/L 9 8 -  ALT 0 - 44 IU/L 6 18 -   Lipid Panel     Component Value Date/Time   CHOL 113 01/11/2019 1405   TRIG 115 01/11/2019 1405   HDL 42 01/11/2019 1405   CHOLHDL 2.7 01/11/2019 1405   CHOLHDL 2.1 09/04/2018 1029   VLDL 11 09/04/2018 1029   LDLCALC 48 01/11/2019 1405     Preferred Learning Style:    No preference indicated    Learning Readiness:   Ready  Change in progress   MEDICATIONS:   DIETARY INTAKE: Breakfast:Oatmeal cookies 2,  Lunch: Sausage, cheese and bread  Dinner: meat and vegetables,  Fruit in evening Water   Usual physical activity: activities on farm.  Estimated energy needs: 1800-2000  calories 200  g carbohydrates 135  g protein 50 g fat  Progress Towards Goal(s):  In progress.   Nutritional Diagnosis:  NB-1.1 Food and nutrition-related knowledge deficit As related to Diabetes.  As evidenced by A1C 7.7%.    Intervention:  Nutrition and Diabetes education provided on My Plate, CHO counting, meal planning, portion sizes, timing of meals, avoiding snacks between meals unless having a low blood sugar, target ranges for A1C and blood sugars, signs/symptoms and treatment of hyper/hypoglycemia, monitoring blood sugars, taking medications as prescribed, benefits of exercising 30 minutes per day and prevention of complications of DM.   Goals Keep up the good job! Try to avoid processed meats of hot dogs, bologna. Increase lower carb vegetables. Drink more water  Eat yogurt daily Eat eggs and oatmeal for breakfast instead of oatmeal  cookie. Get A1C to 7% or less.  Teaching Method Utilized:  Visual Auditory Hands on  Handouts given during visit include:  The Plate Method  Meal Plan Card   Barriers to learning/adherence to lifestyle change: none  Demonstrated degree of understanding via:  Teach Back   Monitoring/Evaluation:  Dietary intake, exercise, meal planning, and body weight in 3-4 month(s).

## 2019-02-27 NOTE — Patient Instructions (Signed)
  Goals Keep up the good job! Try to avoid processed meats of hot dogs, bologna. Increase lower carb vegetables. Drink more water  Eat yogurt daily Eat eggs and oatmeal for breakfast instead of oatmeal cookie. Get A1C to 7% or less.

## 2019-03-01 ENCOUNTER — Encounter: Payer: Self-pay | Admitting: Nutrition

## 2019-03-28 ENCOUNTER — Encounter: Payer: Self-pay | Admitting: Cardiology

## 2019-03-28 ENCOUNTER — Other Ambulatory Visit: Payer: Self-pay

## 2019-03-28 ENCOUNTER — Ambulatory Visit (INDEPENDENT_AMBULATORY_CARE_PROVIDER_SITE_OTHER): Payer: Medicare Other | Admitting: Cardiology

## 2019-03-28 VITALS — BP 117/68 | HR 87 | Temp 97.5°F | Ht 75.5 in | Wt 197.0 lb

## 2019-03-28 DIAGNOSIS — I25119 Atherosclerotic heart disease of native coronary artery with unspecified angina pectoris: Secondary | ICD-10-CM

## 2019-03-28 DIAGNOSIS — Z8679 Personal history of other diseases of the circulatory system: Secondary | ICD-10-CM

## 2019-03-28 DIAGNOSIS — I10 Essential (primary) hypertension: Secondary | ICD-10-CM | POA: Diagnosis not present

## 2019-03-28 DIAGNOSIS — I208 Other forms of angina pectoris: Secondary | ICD-10-CM

## 2019-03-28 NOTE — Patient Instructions (Signed)
Medication Instructions:  Your Physician recommend you continue on your current medication as directed.    *If you need a refill on your cardiac medications before your next appointment, please call your pharmacy*  Lab Work: None  Testing/Procedures: None  Follow-Up: At [facility], you and your health needs are our priority.  As part of our continuing mission to provide you with exceptional heart care, we have created designated Provider Care Teams.  These Care Teams include your primary Cardiologist (physician) and Advanced Practice Providers (APPs -  Physician Assistants and Nurse Practitioners) who all work together to provide you with the care you need, when you need it.  Your next appointment:   3 months  The format for your next appointment:   In Person  Provider:   Glenetta Hew, MD

## 2019-03-28 NOTE — Progress Notes (Signed)
Cardiology Office Note:    Date:  04/26/2019   ID:  [name], DOB Mar 27, 1951, MRN [id number]  PCP:  Dettinger, Fransisca Kaufmann, MD  Cardiologist:  Glenetta Hew, MD  Referring MD: Dettinger, Fransisca Kaufmann, MD   CC: follow up  History of Present Illness:    Andrew [name] is a 68 y.o. male with a hx of CAD-PCI with multivessel disease otherwise treated medically who presents today for follow-up of Myopericarditis. He follows with Dr. Ellyn Hack but was unable to keep that appointment, so I am seeing him today as doctor of the day.  He reports doing well overall since taper of the prednisone. He has also stopped aspirin and colchicine. Reviewed what these medications are/what they treat. Reviewed signs to watch for.  Denies chest pain, shortness of breath at rest or with normal exertion. No PND, orthopnea, LE edema or unexpected weight gain. No syncope or palpitations.   Past Medical History:  Diagnosis Date  . Acute viral pericarditis 09/03/2018   Inferior STE - but Negative Troponin.  CP &SOB.  Felt to be Viral.  . Anemia   . Arthritis   . Bladder cancer ([place])   . Depression   . Diabetes mellitus without complication ([place])   . Essential hypertension 01/20/2018   in the past no longer on medication  . GERD (gastroesophageal reflux disease)   . Hyperlipidemia with target LDL less than 70 12/19/2015  . Multivessel CAD - CTO dLAD, mCx. DES PCI RI, PTCA of RPDA 01/21/2018   12/2017 - Cath for ? STEMI -> distal/apical LAD & m-dCx CTO (unable to cross Cx).  Mod rPDA. Severe RI - DES PCI.  08/2018 - Cath for ? Inf STEMI - RI stent patent & CTO dLAD/mCx. Progression of rPDA 95% -> PTCA only.  Thought to be Pericarditis & not MI (troponin negative).   . Neuromuscular disorder ([place])   . Sleep apnea    BiPAP not currently being used  . Status post tendon repair 1989  . STEMI (ST elevation myocardial infarction) ([place]) 01/20/2018   Cardiac cath January 21, 2018: Severe multivessel disease with unclear  lesion, but opted for PCI/DES x1 to the RI.  Appeared to have CTO of the distal LAD and circumflex as well as moderate mid LAD and diagonal disease as well as PDA.Marland Kitchen  Unable to cross circumflex lesion.  . Stroke ([place])    At times pt has dizziness with loss of vision  . Tremors of nervous system 2011    Past Surgical History:  Procedure Laterality Date  . APPENDECTOMY    . BLADDER SURGERY    . COLONOSCOPY    . CORONARY STENT INTERVENTION N/A 01/21/2018   Procedure: CORONARY STENT INTERVENTION;  Surgeon: Leonie Man, MD;  Location: [place] [facility];  Service: Cardiovascular;  Laterality: N/A;  95% Ramus Intermedius -PCI with synergy DES 2.25 mm x 12 mm postdilated 2.4 mm.  [place] ACUTE MI REVASCULARIZATION N/A 01/21/2018   Procedure: Coronary/Graft Acute MI Revascularization;  Surgeon: Leonie Man, MD;  Location: [place] [facility];  Service: Cardiovascular;  Laterality: N/A;  attempted revas to distal CFX;   . CORONARY/GRAFT ACUTE MI REVASCULARIZATION N/A 09/04/2018   Procedure: Coronary/Graft Acute MI Revascularization;  Surgeon: Sherren Mocha, MD;  Location: [place] [facility]:: PTCA/POBA of rPDA (2.0 mm balloon)  . CYSTOSCOPY W/ RETROGRADES Bilateral 08/22/2015   Procedure: CYSTOSCOPY WITH RETROGRADE PYELOGRAM;  Surgeon: Irine Seal, MD;  Location: [facility];  Service: Urology;  Laterality: Bilateral;  .  CYSTOSCOPY W/ RETROGRADES Bilateral 01/16/2016   Procedure: CYSTOSCOPY WITH RETROGRADE PYELOGRAM;  Surgeon: Irine Seal, MD;  Location: [facility];  Service: Urology;  Laterality: Bilateral;  . CYSTOSCOPY W/ RETROGRADES Bilateral 01/07/2017   Procedure: CYSTOSCOPY WITH RETROGRADE PYELOGRAM;  Surgeon: Irine Seal, MD;  Location: [facility];  Service: Urology;  Laterality: Bilateral;  . CYSTOSCOPY WITH BIOPSY N/A 08/22/2015   Procedure: CYSTOSCOPY WITH BLADDER BIOPSY;  Surgeon: Irine Seal, MD;  Location: [facility];  Service: Urology;  Laterality: N/A;  . CYSTOSCOPY WITH BIOPSY N/A 01/16/2016    Procedure: CYSTOSCOPY WITH BLADDER BIOPSY;  Surgeon: Irine Seal, MD;  Location: [facility];  Service: Urology;  Laterality: N/A;  . CYSTOSCOPY WITH FULGERATION N/A 01/16/2016   Procedure: CYSTOSCOPY WITH FULGERATION;  Surgeon: Irine Seal, MD;  Location: [facility];  Service: Urology;  Laterality: N/A;  . KNEE ARTHROSCOPY Right 1989  . LEFT HEART CATH AND CORONARY ANGIOGRAPHY N/A 01/21/2018   Procedure: LEFT HEART CATH AND CORONARY ANGIOGRAPHY;  Surgeon: Leonie Man, MD;  Location: [place] [facility];  Service: Cardiovascular;; 95% RI-DES PCI.  80% o-pCX (PTCA) -> dCX 80%-100% CTO (unsuccessful PTCA unable to cross distal lesion with balloon.)  100% CTO dLAD. RPDA 80% and 70% as well as P AV 180%, PA V2 60%.  (too small for PCI)  . LEFT HEART CATH AND CORONARY ANGIOGRAPHY N/A 09/04/2018   Procedure: LEFT HEART CATH AND CORONARY ANGIOGRAPHY;  Surgeon: Sherren Mocha, MD;  Location: [place] [facility]: (presumed Inferior STEMI) = progression of small rPDA -95% (PTCA only). Patent RI stent. CTO of apical LAD & mCx.  Marland Kitchen ROTATOR CUFF REPAIR Bilateral 2000  . rt. leg fracture surgery    . TRANSTHORACIC ECHOCARDIOGRAM  01/21/2018   Mild LVH.  EF 50 and 55%.  Apical inferior hypokinesis.  Mid-apical anterolateral hypokinesis.  Normal diastolic function for age   . TRANSTHORACIC ECHOCARDIOGRAM  09/04/2018   -? Inf STEMI vs. Pericarditis:  Mildly reduced EF of 45 to 50%.  Impaired relaxation-GR 1 DD.  Severe hypokinesis of the anterolateral and inferolateral wall.  Small-moderate anterior pericardial effusion.  Moderate aortic sclerosis but no stenosis.  . TRANSURETHRAL RESECTION OF BLADDER TUMOR N/A 01/07/2017   Procedure: TRANSURETHRAL RESECTION OF BLADDER TUMOR (TURBT);  Surgeon: Irine Seal, MD;  Location: [facility];  Service: Urology;  Laterality: N/A;  . WISDOM TOOTH EXTRACTION      Current Medications: Current Outpatient Medications on File Prior to Visit  Medication Sig  . bisoprolol (ZEBETA) 5 MG tablet  Take 0.5 tablets (2.5 mg total) by mouth daily. Take at 2pm  . clopidogrel (PLAVIX) 75 MG tablet Take 1 tablet (75 mg total) by mouth daily.  . empagliflozin (JARDIANCE) 25 MG TABS tablet Take 25 mg by mouth daily.  . fenofibrate (TRICOR) 48 MG tablet Take 1 tablet (48 mg total) by mouth daily.  Marland Kitchen HYDROcodone-acetaminophen (NORCO/VICODIN) 5-325 MG tablet Take 1 tablet by mouth every 8 (eight) hours as needed for moderate pain or severe pain.  Marland Kitchen lisinopril (ZESTRIL) 5 MG tablet Take 0.5 tablets (2.5 mg total) by mouth daily.  . metFORMIN (GLUCOPHAGE) 1000 MG tablet Take 1 tablet by mouth 2 times daily with a meal.  . nitroGLYCERIN (NITROSTAT) 0.4 MG SL tablet Place 1 tablet (0.4 mg total) under the tongue every 5 (five) minutes as needed for chest pain.  Marland Kitchen omeprazole (PRILOSEC) 20 MG capsule TAKE (1) CAPSULE DAILY  . primidone (MYSOLINE) 50 MG tablet Take 1 tablet (50 mg total) by mouth 4 (  four) times daily.  . sitaGLIPtin (JANUVIA) 100 MG tablet Take 1 tablet (100 mg total) by mouth daily.  . tamsulosin (FLOMAX) 0.4 MG CAPS capsule Take 1 capsule (0.4 mg total) by mouth daily.  . isosorbide mononitrate (IMDUR) 30 MG 24 hr tablet Take 1 tablet (30 mg total) by mouth daily.  . rosuvastatin (CRESTOR) 40 MG tablet Take 1 tablet (40 mg total) by mouth daily.   No current facility-administered medications on file prior to visit.      Allergies:   Tramadol and Trulicity [dulaglutide]   Social History   Tobacco Use  . Smoking status: Former Smoker    Packs/day: 0.50    Years: 42.00    Pack years: 21.00    Types: Cigarettes    Quit date: 12/2017    Years since quitting: 1.3  . Smokeless tobacco: Never Used  Substance Use Topics  . Alcohol use: No    Comment: rarely  . Drug use: No    Family History: family history includes Cancer in his brother; Dementia in his father; Diabetes in his brother and brother; Diabetes (age of onset: 29) in his mother; Heart attack (age of onset: 29) in his  brother; Stroke in his mother; Testicular cancer in his brother. There is no history of Colon cancer, Colon polyps, Esophageal cancer, Rectal cancer, or Stomach cancer.  ROS:   Please see the history of present illness.  Additional pertinent ROS: Constitutional: Negative for chills, fever, night sweats, unintentional weight loss  HENT: Negative for ear pain and hearing loss.   Eyes: Negative for loss of vision and eye pain.  Respiratory: Negative for cough, sputum, wheezing.   Cardiovascular: See HPI. Gastrointestinal: Negative for abdominal pain, melena, and hematochezia.  Genitourinary: Negative for dysuria and hematuria.  Musculoskeletal: Negative for falls and myalgias.  Skin: Negative for itching and rash.  Neurological: Negative for focal weakness, focal sensory changes and loss of consciousness.  Endo/Heme/Allergies: Does not bruise/bleed easily.     EKGs/Labs/Other Studies Reviewed:    The following studies were reviewed today: Cath, echo from 09/04/18 reviewed  EKG:  EKG is personally reviewed.  The ekg ordered today demonstrates SR with PVC  Recent Labs: 09/04/2018: Magnesium 2.1 01/11/2019: ALT 6; BUN 11; Creatinine, Ser 0.70; Hemoglobin 15.8; Platelets CANCELED; Potassium 5.3; Sodium 138  Recent Lipid Panel    Component Value Date/Time   CHOL 113 01/11/2019 1405   TRIG 115 01/11/2019 1405   HDL 42 01/11/2019 1405   CHOLHDL 2.7 01/11/2019 1405   CHOLHDL 2.1 09/04/2018 1029   VLDL 11 09/04/2018 1029   LDLCALC 48 01/11/2019 1405    Physical Exam:    VS:  BP 117/68   Pulse 87   Temp (!) 97.5 F (36.4 C)   Ht 6' 3.5" (1.918 m)   Wt 197 lb (89.4 kg)   SpO2 96%   BMI 24.30 kg/m     Wt Readings from Last 3 Encounters:  03/28/19 197 lb (89.4 kg)  01/11/19 192 lb 12.8 oz (87.5 kg)  10/11/18 190 lb 3.2 oz (86.3 kg)    GEN: Well nourished, well developed in no acute distress HEENT: Normal, moist mucous membranes NECK: No JVD CARDIAC: regular rhythm, normal S1  and S2, no rubs or gallops. No murmurs. VASCULAR: Radial and DP pulses 2+ bilaterally. No carotid bruits RESPIRATORY:  Clear to auscultation without rales, wheezing or rhonchi  ABDOMEN: Soft, non-tender, non-distended MUSCULOSKELETAL:  Ambulates independently SKIN: Warm and dry, no edema NEUROLOGIC:  Alert and  oriented x 3. No focal neuro deficits noted. PSYCHIATRIC:  Normal affect    ASSESSMENT:    1. Essential hypertension   2. Multivessel CAD - CTO dLAD, mCx. DES PCI RI, PTCA of RPDA   3. History of pericarditis    PLAN:    History of recently treated myopericarditis: off prednisone, aspirin, and colchicine. Doing well -instructed on red flag warning signs that need immediate medical attention  Hypertension: well controlled today -continue bisoprolol, lisinopril, imdur  History of multivessel CAD:  -continue clopidogrel, rosuvastatin -continue bisoprolol, imdur for antianginals -on empagliflozin (SGLT2i)  Plan for follow up: with Dr. Ellyn Hack in 3 mos  Medication Adjustments/Labs and Tests Ordered: Current medicines are reviewed at length with the patient today.  Concerns regarding medicines are outlined above.  Orders Placed This Encounter  Procedures  . EKG 12-Lead   No orders of the defined types were placed in this encounter.   Patient Instructions  Medication Instructions:  Your Physician recommend you continue on your current medication as directed.    *If you need a refill on your cardiac medications before your next appointment, please call your pharmacy*  Lab Work: None  Testing/Procedures: None  Follow-Up: At [facility], you and your health needs are our priority.  As part of our continuing mission to provide you with exceptional heart care, we have created designated Provider Care Teams.  These Care Teams include your primary Cardiologist (physician) and Advanced Practice Providers (APPs -  Physician Assistants and Nurse Practitioners) who all work  together to provide you with the care you need, when you need it.  Your next appointment:   3 months  The format for your next appointment:   In Person  Provider:   Glenetta Hew, MD     Signed, Andrew Dresser, MD PhD 04/26/2019 5:22 PM    [place]

## 2019-03-29 ENCOUNTER — Ambulatory Visit: Payer: Medicare Other | Admitting: Cardiology

## 2019-04-04 ENCOUNTER — Telehealth: Payer: Self-pay | Admitting: Family Medicine

## 2019-04-04 NOTE — Chronic Care Management (AMB) (Signed)
°  Chronic Care Management   Outreach Note  04/04/2019 Name: Andrew [name] MRN: [id number] DOB: May 21, 1951  Referred by: Dettinger, Fransisca Kaufmann, MD Reason for referral : Chronic Care Management (Initial CCM outreach was unuccessful.)   An unsuccessful telephone outreach was attempted today. The patient was referred to the case management team by for assistance with care management and care coordination.   Follow Up Plan: A HIPPA compliant phone message was left for the patient providing contact information and requesting a return call.  The care management team will reach out to the patient again over the next 7 days.  If patient returns call to provider office, please advise to call [place] [facility] [place], [address]  [place], [place] [address] Direct Dial: [email or web address]  Website: [email or web address]

## 2019-04-10 NOTE — Chronic Care Management (AMB) (Signed)
°  Chronic Care Management   Outreach Note  04/10/2019 Name: Andrew [name] MRN: [id number] DOB: 05/19/1951  Referred by: Dettinger, Fransisca Kaufmann, MD Reason for referral : Chronic Care Management (Initial CCM outreach was unuccessful.) and Chronic Care Management (Second CCM outreach was unsuccessful.)   A second unsuccessful telephone outreach was attempted today. The patient was referred to the case management team for assistance with care management and care coordination.   Follow Up Plan: A HIPPA compliant phone message was left for the patient providing contact information and requesting a return call.  The care management team will reach out to the patient again over the next 7 days.  If patient returns call to provider office, please advise to call [place] [facility] [place], [address]  [place], [place] [address] Direct Dial: [email or web address]  Website: [email or web address]

## 2019-04-26 ENCOUNTER — Encounter: Payer: Self-pay | Admitting: Cardiology

## 2019-04-26 DIAGNOSIS — Z8679 Personal history of other diseases of the circulatory system: Secondary | ICD-10-CM | POA: Insufficient documentation

## 2019-05-02 ENCOUNTER — Ambulatory Visit (INDEPENDENT_AMBULATORY_CARE_PROVIDER_SITE_OTHER): Payer: Medicare Other | Admitting: Family Medicine

## 2019-05-02 ENCOUNTER — Encounter: Payer: Self-pay | Admitting: Family Medicine

## 2019-05-02 ENCOUNTER — Other Ambulatory Visit: Payer: Self-pay

## 2019-05-02 VITALS — BP 97/64 | HR 83 | Temp 98.6°F | Ht 75.5 in | Wt 195.6 lb

## 2019-05-02 DIAGNOSIS — R399 Unspecified symptoms and signs involving the genitourinary system: Secondary | ICD-10-CM | POA: Diagnosis not present

## 2019-05-02 DIAGNOSIS — E119 Type 2 diabetes mellitus without complications: Secondary | ICD-10-CM | POA: Diagnosis not present

## 2019-05-02 DIAGNOSIS — I209 Angina pectoris, unspecified: Secondary | ICD-10-CM

## 2019-05-02 DIAGNOSIS — R809 Proteinuria, unspecified: Secondary | ICD-10-CM

## 2019-05-02 DIAGNOSIS — I208 Other forms of angina pectoris: Secondary | ICD-10-CM | POA: Diagnosis not present

## 2019-05-02 DIAGNOSIS — E785 Hyperlipidemia, unspecified: Secondary | ICD-10-CM

## 2019-05-02 DIAGNOSIS — G250 Essential tremor: Secondary | ICD-10-CM

## 2019-05-02 DIAGNOSIS — I10 Essential (primary) hypertension: Secondary | ICD-10-CM

## 2019-05-02 DIAGNOSIS — E1159 Type 2 diabetes mellitus with other circulatory complications: Secondary | ICD-10-CM

## 2019-05-02 LAB — BAYER DCA HB A1C WAIVED: HB A1C (BAYER DCA - WAIVED): 7.8 % — ABNORMAL HIGH (ref <7.0)

## 2019-05-02 MED ORDER — SITAGLIPTIN PHOSPHATE 100 MG PO TABS: 100.0000 mg | ORAL_TABLET | Freq: Every day | ORAL | 3 refills | Status: DC

## 2019-05-02 MED ORDER — ISOSORBIDE MONONITRATE ER 30 MG PO TB24: 15.0000 mg | ORAL_TABLET | Freq: Every day | ORAL | 3 refills | Status: DC

## 2019-05-02 MED ORDER — CLOPIDOGREL BISULFATE 75 MG PO TABS: 75.0000 mg | ORAL_TABLET | Freq: Every day | ORAL | 3 refills | Status: AC

## 2019-05-02 MED ORDER — METFORMIN HCL 1000 MG PO TABS: ORAL_TABLET | ORAL | 3 refills | Status: DC

## 2019-05-02 MED ORDER — TAMSULOSIN HCL 0.4 MG PO CAPS: 0.4000 mg | ORAL_CAPSULE | Freq: Every day | ORAL | 3 refills | Status: AC

## 2019-05-02 MED ORDER — FENOFIBRATE 48 MG PO TABS: 48.0000 mg | ORAL_TABLET | Freq: Every day | ORAL | 3 refills | Status: AC

## 2019-05-02 MED ORDER — BISOPROLOL FUMARATE 5 MG PO TABS: 2.5000 mg | ORAL_TABLET | Freq: Every day | ORAL | 3 refills | Status: DC

## 2019-05-02 MED ORDER — EMPAGLIFLOZIN 25 MG PO TABS: 25.0000 mg | ORAL_TABLET | Freq: Every day | ORAL | 3 refills | Status: AC

## 2019-05-02 MED ORDER — ROSUVASTATIN CALCIUM 40 MG PO TABS: 40.0000 mg | ORAL_TABLET | Freq: Every day | ORAL | 3 refills | Status: DC

## 2019-05-02 MED ORDER — OMEPRAZOLE 20 MG PO CPDR: DELAYED_RELEASE_CAPSULE | ORAL | 3 refills | Status: DC

## 2019-05-02 MED ORDER — PRIMIDONE 50 MG PO TABS: 50.0000 mg | ORAL_TABLET | Freq: Four times a day (QID) | ORAL | 1 refills | Status: DC

## 2019-05-02 MED ORDER — LISINOPRIL 2.5 MG PO TABS: 2.5000 mg | ORAL_TABLET | Freq: Every day | ORAL | 3 refills | Status: DC

## 2019-05-02 NOTE — Progress Notes (Signed)
BP 97/64   Pulse 83   Temp 98.6 F (37 C) (Temporal)   Ht 6' 3.5" (1.918 m)   Wt 195 lb 9.6 oz (88.7 kg)   SpO2 93%   BMI 24.13 kg/m    Subjective:   Patient ID: Andrew [name], male    DOB: April 09, 1951, 68 y.o.   MRN: [id number]  HPI: Andrew [name] is a 68 y.o. male presenting on 05/02/2019 for Diabetes (check up of chronic medical conditions) and Hypertension (Patient states that the last 2 days when he wakes up and blows his nose there is blood in it.)   HPI Type 2 diabetes mellitus Patient comes in today for recheck of his diabetes. Patient has been currently taking metformin and Jardiance and Januvia. Patient is currently on an ACE inhibitor/ARB. Patient has not seen an ophthalmologist this year. Patient denies any issues with their feet.  A1c is 7.8.  Patient has known microalbuminuria.  Hypertension Patient is currently on lisinopril and bisoprolol and Imdur, and their blood pressure today is 97/64. Patient denies any lightheadedness or dizziness. Patient denies headaches, blurred vision, chest pains, shortness of breath, or weakness. Denies any side effects from medication and is content with current medication.   Hyperlipidemia Patient is coming in for recheck of his hyperlipidemia. The patient is currently taking fenofibrate and Crestor. They deny any issues with myalgias or history of liver damage from it. They deny any focal numbness or weakness or chest pain.   Patient has COPD but denies wanting any inhalers, mild wheezing currently  Relevant past medical, surgical, family and social history reviewed and updated as indicated. Interim medical history since our last visit reviewed. Allergies and medications reviewed and updated.  Review of Systems  Constitutional: Negative for chills and fever.  Eyes: Negative for visual disturbance.  Respiratory: Negative for shortness of breath and wheezing.   Cardiovascular: Negative for chest pain and leg swelling.   Musculoskeletal: Negative for back pain and gait problem.  Skin: Negative for rash.  Neurological: Negative for dizziness and weakness.  All other systems reviewed and are negative.   Per HPI unless specifically indicated above   Allergies as of 05/02/2019      Reactions   Tramadol Shortness Of Breath   And dizziness   Trulicity [dulaglutide] Swelling   Ankles and feet swell      Medication List       Accurate as of May 02, 2019 10:31 AM. If you have any questions, ask your nurse or doctor.        bisoprolol 5 MG tablet Commonly known as: ZEBETA Take 0.5 tablets (2.5 mg total) by mouth daily. Take at 2pm   clopidogrel 75 MG tablet Commonly known as: Plavix Take 1 tablet (75 mg total) by mouth daily.   empagliflozin 25 MG Tabs tablet Commonly known as: JARDIANCE Take 25 mg by mouth daily.   fenofibrate 48 MG tablet Commonly known as: Tricor Take 1 tablet (48 mg total) by mouth daily.   HYDROcodone-acetaminophen 5-325 MG tablet Commonly known as: NORCO/VICODIN Take 1 tablet by mouth every 8 (eight) hours as needed for moderate pain or severe pain.   isosorbide mononitrate 30 MG 24 hr tablet Commonly known as: IMDUR Take 1 tablet (30 mg total) by mouth daily.   lisinopril 5 MG tablet Commonly known as: ZESTRIL Take 0.5 tablets (2.5 mg total) by mouth daily.   metFORMIN 1000 MG tablet Commonly known as: GLUCOPHAGE Take 1 tablet by mouth 2 times daily with  a meal.   nitroGLYCERIN 0.4 MG SL tablet Commonly known as: NITROSTAT Place 1 tablet (0.4 mg total) under the tongue every 5 (five) minutes as needed for chest pain.   omeprazole 20 MG capsule Commonly known as: PRILOSEC TAKE (1) CAPSULE DAILY   primidone 50 MG tablet Commonly known as: MYSOLINE Take 1 tablet (50 mg total) by mouth 4 (four) times daily.   rosuvastatin 40 MG tablet Commonly known as: CRESTOR Take 1 tablet (40 mg total) by mouth daily.   sitaGLIPtin 100 MG tablet Commonly known  as: JANUVIA Take 1 tablet (100 mg total) by mouth daily.   tamsulosin 0.4 MG Caps capsule Commonly known as: FLOMAX Take 1 capsule (0.4 mg total) by mouth daily.        Objective:   BP 97/64   Pulse 83   Temp 98.6 F (37 C) (Temporal)   Ht 6' 3.5" (1.918 m)   Wt 195 lb 9.6 oz (88.7 kg)   SpO2 93%   BMI 24.13 kg/m   Wt Readings from Last 3 Encounters:  05/02/19 195 lb 9.6 oz (88.7 kg)  03/28/19 197 lb (89.4 kg)  01/11/19 192 lb 12.8 oz (87.5 kg)    Physical Exam Vitals and nursing note reviewed.  Constitutional:      General: He is not in acute distress.    Appearance: He is well-developed. He is not diaphoretic.  Eyes:     General: No scleral icterus.    Conjunctiva/sclera: Conjunctivae normal.  Neck:     Thyroid: No thyromegaly.  Cardiovascular:     Rate and Rhythm: Normal rate and regular rhythm.     Heart sounds: Normal heart sounds. No murmur.  Pulmonary:     Effort: Pulmonary effort is normal. No respiratory distress.     Breath sounds: Normal breath sounds. No wheezing.  Musculoskeletal:        General: Normal range of motion.     Cervical back: Neck supple.  Lymphadenopathy:     Cervical: No cervical adenopathy.  Skin:    General: Skin is warm and dry.     Findings: No rash.  Neurological:     Mental Status: He is alert and oriented to person, place, and time.     Coordination: Coordination normal.  Psychiatric:        Behavior: Behavior normal.      Assessment & Plan:   Problem List Items Addressed This Visit      Cardiovascular and Mediastinum   Type 2 diabetes mellitus with circulatory disorder, without long-term current use of insulin (HCC) (Chronic)   Relevant Medications   bisoprolol (ZEBETA) 5 MG tablet   lisinopril (ZESTRIL) 2.5 MG tablet   metFORMIN (GLUCOPHAGE) 1000 MG tablet   sitaGLIPtin (JANUVIA) 100 MG tablet   empagliflozin (JARDIANCE) 25 MG TABS tablet   fenofibrate (TRICOR) 48 MG tablet   rosuvastatin (CRESTOR) 40 MG  tablet   isosorbide mononitrate (IMDUR) 30 MG 24 hr tablet   Essential hypertension (Chronic)   Relevant Medications   bisoprolol (ZEBETA) 5 MG tablet   lisinopril (ZESTRIL) 2.5 MG tablet   fenofibrate (TRICOR) 48 MG tablet   rosuvastatin (CRESTOR) 40 MG tablet   isosorbide mononitrate (IMDUR) 30 MG 24 hr tablet   Other Relevant Orders   CMP14+EGFR (Completed)     Nervous and Auditory   Essential tremor   Relevant Medications   primidone (MYSOLINE) 50 MG tablet     Other   Hyperlipidemia with target LDL less than 70 (  Chronic)   Relevant Medications   bisoprolol (ZEBETA) 5 MG tablet   lisinopril (ZESTRIL) 2.5 MG tablet   fenofibrate (TRICOR) 48 MG tablet   rosuvastatin (CRESTOR) 40 MG tablet   isosorbide mononitrate (IMDUR) 30 MG 24 hr tablet    Other Visit Diagnoses    Type 2 diabetes mellitus without complication, without long-term current use of insulin (HCC)    -  Primary   Relevant Medications   lisinopril (ZESTRIL) 2.5 MG tablet   metFORMIN (GLUCOPHAGE) 1000 MG tablet   sitaGLIPtin (JANUVIA) 100 MG tablet   empagliflozin (JARDIANCE) 25 MG TABS tablet   rosuvastatin (CRESTOR) 40 MG tablet   Other Relevant Orders   Microalbumin / creatinine urine ratio   hgba1c (Completed)   CMP14+EGFR (Completed)   Angina pectoris (HCC)       Relevant Medications   omeprazole (PRILOSEC) 20 MG capsule   bisoprolol (ZEBETA) 5 MG tablet   lisinopril (ZESTRIL) 2.5 MG tablet   clopidogrel (PLAVIX) 75 MG tablet   fenofibrate (TRICOR) 48 MG tablet   rosuvastatin (CRESTOR) 40 MG tablet   isosorbide mononitrate (IMDUR) 30 MG 24 hr tablet   Other Relevant Orders   CBC with Differential/Platelet (Completed)   Lower urinary tract symptoms       Relevant Medications   tamsulosin (FLOMAX) 0.4 MG CAPS capsule   Microalbuminuria       Relevant Medications   lisinopril (ZESTRIL) 2.5 MG tablet      Patient's blood pressure low, will cut the Imdur in half, of already cut everything else in  half.  Patient will do diet and lifestyle modification instead of medication changes for diabetes for now and we will monitor from there  Patient has been having urinary frequency and takes Flomax for it. Follow up plan: Return in about 3 months (around 07/31/2019), or if symptoms worsen or fail to improve, for Diabetes and hypertension recheck.  Counseling provided for all of the vaccine components Orders Placed This Encounter  Procedures  . Microalbumin / creatinine urine ratio  . hgba1c    Caryl Pina, MD [facility] 05/02/2019, 10:31 AM

## 2019-05-02 NOTE — Chronic Care Management (AMB) (Signed)
°  Chronic Care Management   Outreach Note  05/02/2019 Name: Andrew [name] MRN: [id number] DOB: 1950/05/31  Referred by: Dettinger, Fransisca Kaufmann, MD Reason for referral : Chronic Care Management (Initial CCM outreach was unuccessful.), Chronic Care Management (Second CCM outreach was unsuccessful.), and Chronic Care Management (Third CCM outreach was unsuccessful. )   Third unsuccessful telephone outreach was attempted today. The patient was referred to the case management team for assistance with care management and care coordination. The patient's primary care provider has been notified of our unsuccessful attempts to make or maintain contact with the patient. The care management team is pleased to engage with this patient at any time in the future should he/she be interested in assistance from the care management team.   Follow Up Plan: The care management team is available to follow up with the patient after provider conversation with the patient regarding recommendation for care management engagement and subsequent re-referral to the care management team.   [place], [facility]  [place], [place] [id number] Direct Dial: [email or web address]  Website: [email or web address]

## 2019-05-03 DIAGNOSIS — B351 Tinea unguium: Secondary | ICD-10-CM | POA: Diagnosis not present

## 2019-05-03 DIAGNOSIS — M79676 Pain in unspecified toe(s): Secondary | ICD-10-CM | POA: Diagnosis not present

## 2019-05-03 DIAGNOSIS — E1151 Type 2 diabetes mellitus with diabetic peripheral angiopathy without gangrene: Secondary | ICD-10-CM | POA: Diagnosis not present

## 2019-05-03 DIAGNOSIS — L84 Corns and callosities: Secondary | ICD-10-CM | POA: Diagnosis not present

## 2019-05-03 LAB — CMP14+EGFR
ALT: 7 IU/L (ref 0–44)
AST: 5 IU/L (ref 0–40)
Albumin/Globulin Ratio: 1.2 (ref 1.2–2.2)
Albumin: 4.1 g/dL (ref 3.8–4.8)
Alkaline Phosphatase: 69 IU/L (ref 39–117)
BUN/Creatinine Ratio: 17 (ref 10–24)
BUN: 16 mg/dL (ref 8–27)
Bilirubin Total: 0.3 mg/dL (ref 0.0–1.2)
CO2: 22 mmol/L (ref 20–29)
Calcium: 10 mg/dL (ref 8.6–10.2)
Chloride: 100 mmol/L (ref 96–106)
Creatinine, Ser: 0.92 mg/dL (ref 0.76–1.27)
GFR calc Af Amer: 98 mL/min/{1.73_m2} (ref >59)
GFR calc non Af Amer: 85 mL/min/{1.73_m2} (ref >59)
Globulin, Total: 3.3 g/dL (ref 1.5–4.5)
Glucose: 154 mg/dL — ABNORMAL HIGH (ref 65–99)
Potassium: 4.8 mmol/L (ref 3.5–5.2)
Sodium: 140 mmol/L (ref 134–144)
Total Protein: 7.4 g/dL (ref 6.0–8.5)

## 2019-05-03 LAB — CBC WITH DIFFERENTIAL/PLATELET
Basophils Absolute: 0.1 10*3/uL (ref 0.0–0.2)
Basos: 1 %
EOS (ABSOLUTE): 0.4 10*3/uL (ref 0.0–0.4)
Eos: 4 %
Hematocrit: 48.3 % (ref 37.5–51.0)
Hemoglobin: 16.3 g/dL (ref 13.0–17.7)
Immature Grans (Abs): 0 10*3/uL (ref 0.0–0.1)
Immature Granulocytes: 0 %
Lymphocytes Absolute: 1.8 10*3/uL (ref 0.7–3.1)
Lymphs: 20 %
MCH: 31.8 pg (ref 26.6–33.0)
MCHC: 33.7 g/dL (ref 31.5–35.7)
MCV: 94 fL (ref 79–97)
Monocytes Absolute: 0.8 10*3/uL (ref 0.1–0.9)
Monocytes: 9 %
Neutrophils Absolute: 5.9 10*3/uL (ref 1.4–7.0)
Neutrophils: 66 %
Platelets: 138 10*3/uL — ABNORMAL LOW (ref 150–450)
RBC: 5.13 x10E6/uL (ref 4.14–5.80)
RDW: 12.9 % (ref 11.6–15.4)
WBC: 8.9 10*3/uL (ref 3.4–10.8)

## 2019-05-15 ENCOUNTER — Encounter: Payer: Self-pay | Admitting: Urology

## 2019-05-16 ENCOUNTER — Other Ambulatory Visit: Payer: Self-pay | Admitting: Family Medicine

## 2019-05-16 NOTE — Telephone Encounter (Signed)
Patient has no money this month to pick up his [place]. He does have some Janumet 50/500mg  can he take that instead

## 2019-05-16 NOTE — Telephone Encounter (Signed)
Yes he can take that but it also has the metformin 500 in it so instead of taking his at 1000 mg Metformin with this, he should only take half of the Metformin with this and take this 1 twice a day and the metformin only half a tablet with this twice a day.  In total he should be taking at 1000 mg of Metformin in the morning and at thousand in the evening but 500 will be coming from this pill and 500 will be coming from a half of the tablet of metformin.

## 2019-05-16 NOTE — Telephone Encounter (Signed)
What is the name of the medication? sitaGLIPtin (JANUVIA) 100 MG tablet  Have you contacted your pharmacy to request a refill? No because pt wants samples  Which pharmacy would you like this sent to? [facility] If no samples are available   Patient notified that their request is being sent to the clinical staff for review and that they should receive a call once it is complete. If they do not receive a call within 24 hours they can check with their pharmacy or our office.

## 2019-05-16 NOTE — Telephone Encounter (Signed)
Patient notified and verbalized understanding. 

## 2019-05-28 ENCOUNTER — Telehealth: Payer: Self-pay | Admitting: Family Medicine

## 2019-05-29 NOTE — Telephone Encounter (Signed)
LMOVM ppw is on providers desk, will be faxed as soon as possible provider has been on vacation

## 2019-06-11 ENCOUNTER — Ambulatory Visit: Payer: Medicare Other | Admitting: Nutrition

## 2019-06-20 DIAGNOSIS — E119 Type 2 diabetes mellitus without complications: Secondary | ICD-10-CM | POA: Diagnosis not present

## 2019-06-20 DIAGNOSIS — H25813 Combined forms of age-related cataract, bilateral: Secondary | ICD-10-CM | POA: Diagnosis not present

## 2019-06-27 ENCOUNTER — Telehealth: Payer: Self-pay

## 2019-06-27 NOTE — Telephone Encounter (Signed)
   [facility] Pre-operative Risk Assessment    Request for surgical clearance:  1. What type of surgery is being performed? Possible cleaning, radiographs, fillings, crowns, bridges, extraction (simple or surgical), root canal therapy, local anesthetic (with epinephrine)   2. When is this surgery scheduled? tbd  3. What type of clearance is required (medical clearance vs. Pharmacy clearance to hold med vs. Both)? Both  4. Are there any medications that need to be held prior to surgery and how long? Plavix  5. Practice name and name of physician performing surgery? Rand Shastri DDS, MHA  6. What is your office phone number [phone]   7.   What is your office fax number [phone]  8.   Anesthesia type (None, local, MAC, general) ? local   Andrew [name] Andrew [name] 06/27/2019, 4:31 PM  _________________________________________________________________   (provider comments below)

## 2019-06-28 NOTE — Telephone Encounter (Signed)
Okay to hold Plavix.  Was angioplasty only last year.  Should be fine.   Glenetta Hew, MD

## 2019-06-28 NOTE — Telephone Encounter (Signed)
   Primary Cardiologist: Glenetta Hew, MD  Chart reviewed as part of pre-operative protocol coverage. Simple dental extractions are considered low risk procedures per guidelines and generally do not require any specific cardiac clearance. It is also generally accepted that for simple extractions and dental cleanings, there is no need to interrupt blood thinner therapy.   Patient had balloon angioplasty in 08/2018, so ideally would conitnue Plavix. However, if necessary, Dr. Ellyn Hack said it would be OK to hold Plavix with plans to restart it as soon as possible following procedure.  I will route this recommendation to the requesting party via Epic fax function and remove from pre-op pool.  Please call with questions.  Darreld Mclean, PA-C 06/28/2019, 2:51 PM

## 2019-06-28 NOTE — Telephone Encounter (Signed)
Hi Dr. Ellyn Hack,   Patient has upcoming dental procedure with possible root canal. I know for simple dental cleaning/extractions, we do not recommend holding Plavix. However, is that true for root canals as well? Would you be OK with him holding Plavix if he needs a root canal? He has a history of CAD - most recent cath in 08/2018 showed multivessel CAD with chronic occlusion of the apical LAD and mid CX, patent ramus intermedius stent, and severe progressive stenosis of the right PDA with was treated balloon angioplasty. Uninterrupted DAPT for 12 months was recommended. So if Plavix does indeed need to be held, are you OK with him having the procedure now or should we wait 2 more months until after he has completed a full year of DAPT?  Please route your response back to P [facility] PREOP.  Thank you! Dierdre Mccalip

## 2019-07-03 ENCOUNTER — Ambulatory Visit: Payer: Medicare Other | Admitting: Cardiology

## 2019-07-18 DIAGNOSIS — H52203 Unspecified astigmatism, bilateral: Secondary | ICD-10-CM | POA: Diagnosis not present

## 2019-07-18 DIAGNOSIS — H527 Unspecified disorder of refraction: Secondary | ICD-10-CM | POA: Diagnosis not present

## 2019-07-18 DIAGNOSIS — H25813 Combined forms of age-related cataract, bilateral: Secondary | ICD-10-CM | POA: Diagnosis not present

## 2019-07-18 DIAGNOSIS — E119 Type 2 diabetes mellitus without complications: Secondary | ICD-10-CM | POA: Diagnosis not present

## 2019-07-18 DIAGNOSIS — H353131 Nonexudative age-related macular degeneration, bilateral, early dry stage: Secondary | ICD-10-CM | POA: Diagnosis not present

## 2019-07-18 LAB — HM DIABETES EYE EXAM

## 2019-07-23 DIAGNOSIS — H2181 Floppy iris syndrome: Secondary | ICD-10-CM

## 2019-07-23 HISTORY — DX: Floppy iris syndrome: H21.81

## 2019-07-24 ENCOUNTER — Telehealth: Payer: Self-pay | Admitting: Student

## 2019-07-24 DIAGNOSIS — K219 Gastro-esophageal reflux disease without esophagitis: Secondary | ICD-10-CM

## 2019-07-24 MED ORDER — PANTOPRAZOLE SODIUM 40 MG PO TBEC: 40.0000 mg | DELAYED_RELEASE_TABLET | Freq: Every day | ORAL | 2 refills | Status: DC

## 2019-07-24 NOTE — Telephone Encounter (Addendum)
   Received note from [place] notifying us of possible drug interaction. Patient on Plavix and Omeprazole. Called and spoke with patient and he is indeed taking both of this. Explained that there can be drug interactions between these two medications and recommended switching to Protonix. Patient in agreement with this. Will stop Omeprazole and send prescription of Protonix 40mg  daily into patient's preferred pharmacy.   Will also route note to PCP (Dr. Warrick Parisian) so that he is aware per patient's request.   Darreld Mclean, PA-C 07/24/2019 11:25 AM

## 2019-07-24 NOTE — Telephone Encounter (Signed)
Okay sounds great, I agree with the change

## 2019-07-25 ENCOUNTER — Ambulatory Visit (INDEPENDENT_AMBULATORY_CARE_PROVIDER_SITE_OTHER): Payer: Medicare Other | Admitting: *Deleted

## 2019-07-25 DIAGNOSIS — Z0000 Encounter for general adult medical examination without abnormal findings: Secondary | ICD-10-CM

## 2019-07-25 NOTE — Progress Notes (Signed)
MEDICARE ANNUAL WELLNESS VISIT  07/25/2019  Telephone Visit Disclaimer This Medicare AWV was conducted by telephone due to national recommendations for restrictions regarding the COVID-19 Pandemic (e.g. social distancing).  I verified, using two identifiers, that I am speaking with Andrew [name] or their authorized healthcare agent. I discussed the limitations, risks, security, and privacy concerns of performing an evaluation and management service by telephone and the potential availability of an in-person appointment in the future. The patient expressed understanding and agreed to proceed.   Subjective:  Andrew [name] is a 70 y.o. male patient of Dettinger, Fransisca Kaufmann, MD who had a Medicare Annual Wellness Visit today via telephone. Andrew [name] is retired and lives alone. He has 1 daughter and she lives locally. He reports that he is socially active and does interact with friends/family regularly. He is not physically active and enjoys working outside and taking care of his horses.  Patient Care Team: Dettinger, Fransisca Kaufmann, MD as PCP - General (Family Medicine) Leonie Man, MD as PCP - Cardiology (Cardiology) Irine Seal, MD as Attending Physician (Urology)  Advanced Directives 07/25/2019 09/11/2018 09/04/2018 09/03/2018 02/10/2018 01/20/2018 10/14/2017  Does Patient Have a Medical Advance Directive? Yes Yes Yes No No No Yes  Type of Paramedic of [place];Living will;Out of facility DNR (pink MOST or yellow form) Healthcare Power of [place]  Does patient want to make changes to medical advance directive? - - No - Patient declined - - - -  Copy of [place] in [place]  Would patient like information on creating a medical advance directive? - - - No - Patient declined - - [facility] Utilization Over the Past 12 Months: # of hospitalizations or ER visits: 1 # of surgeries: 1  Review of Systems      Patient reports that his overall health is worse compared to last year.  History obtained from the patient.  Patient Reported Readings (BP, Pulse, CBG, Weight, etc) none  Pain Assessment Pain : No/denies pain     Current Medications & Allergies (verified) Allergies as of 07/25/2019      Reactions   Tramadol Shortness Of Breath   And dizziness   Trulicity [dulaglutide] Swelling   Ankles and feet swell      Medication List       Accurate as of July 25, 2019  1:55 PM. If you have any questions, ask your nurse or doctor.        bisoprolol 5 MG tablet Commonly known as: ZEBETA Take 0.5 tablets (2.5 mg total) by mouth daily. Take at 2pm   clopidogrel 75 MG tablet Commonly known as: Plavix Take 1 tablet (75 mg total) by mouth daily.   empagliflozin 25 MG Tabs tablet Commonly known as: JARDIANCE Take 25 mg by mouth daily.   fenofibrate 48 MG tablet Commonly known as: Tricor Take 1 tablet (48 mg total) by mouth daily.   HYDROcodone-acetaminophen 5-325 MG tablet Commonly known as: NORCO/VICODIN Take 1 tablet by mouth every 8 (eight) hours as needed for moderate pain or severe pain.   isosorbide mononitrate 30 MG 24 hr tablet Commonly known as: IMDUR Take 0.5 tablets (15 mg total) by mouth daily.   ketorolac 0.5 % ophthalmic solution Commonly known as: ACULAR 1 drop 4 (four) times daily.   lisinopril 2.5 MG tablet Commonly known as: ZESTRIL Take 1 tablet (2.5 mg  total) by mouth daily.   metFORMIN 1000 MG tablet Commonly known as: GLUCOPHAGE Take 1 tablet by mouth 2 times daily with a meal.   moxifloxacin 0.5 % ophthalmic solution Commonly known as: VIGAMOX Apply 1 drop to eye 4 (four) times daily.   nitroGLYCERIN 0.4 MG SL tablet Commonly known as: NITROSTAT Place 1 tablet (0.4 mg total) under the tongue every 5 (five) minutes as needed for chest pain.   pantoprazole 40 MG tablet Commonly known as: PROTONIX Take 1 tablet (40 mg total) by mouth daily.    prednisoLONE acetate 1 % ophthalmic suspension Commonly known as: PRED FORTE Place 1 drop into the left eye 4 (four) times daily.   primidone 50 MG tablet Commonly known as: MYSOLINE Take 1 tablet (50 mg total) by mouth 4 (four) times daily.   rosuvastatin 40 MG tablet Commonly known as: CRESTOR Take 1 tablet (40 mg total) by mouth daily.   sitaGLIPtin 100 MG tablet Commonly known as: JANUVIA Take 1 tablet (100 mg total) by mouth daily.   tamsulosin 0.4 MG Caps capsule Commonly known as: FLOMAX Take 1 capsule (0.4 mg total) by mouth daily.       History (reviewed): Past Medical History:  Diagnosis Date  . Acute viral pericarditis 09/03/2018   Inferior STE - but Negative Troponin.  CP &SOB.  Felt to be Viral.  . Anemia   . Arthritis   . Bladder cancer ([place])   . Depression   . Diabetes mellitus without complication ([place])   . Essential hypertension 01/20/2018   in the past no longer on medication  . GERD (gastroesophageal reflux disease)   . Hyperlipidemia with target LDL less than 70 12/19/2015  . Multivessel CAD - CTO dLAD, mCx. DES PCI RI, PTCA of RPDA 01/21/2018   12/2017 - Cath for ? STEMI -> distal/apical LAD & m-dCx CTO (unable to cross Cx).  Mod rPDA. Severe RI - DES PCI.  08/2018 - Cath for ? Inf STEMI - RI stent patent & CTO dLAD/mCx. Progression of rPDA 95% -> PTCA only.  Thought to be Pericarditis & not MI (troponin negative).   . Neuromuscular disorder ([place])   . Sleep apnea    BiPAP not currently being used  . Status post tendon repair 1989  . STEMI (ST elevation myocardial infarction) ([place]) 01/20/2018   Cardiac cath January 21, 2018: Severe multivessel disease with unclear lesion, but opted for PCI/DES x1 to the RI.  Appeared to have CTO of the distal LAD and circumflex as well as moderate mid LAD and diagonal disease as well as PDA.Marland Kitchen  Unable to cross circumflex lesion.  . Stroke ([place])    At times pt has dizziness with loss of vision  . Tremors of nervous system  2011   Past Surgical History:  Procedure Laterality Date  . APPENDECTOMY    . BLADDER SURGERY    . COLONOSCOPY    . CORONARY STENT INTERVENTION N/A 01/21/2018   Procedure: CORONARY STENT INTERVENTION;  Surgeon: Leonie Man, MD;  Location: [facility];  Service: Cardiovascular;  Laterality: N/A;  95% Ramus Intermedius -PCI with synergy DES 2.25 mm x 12 mm postdilated 2.4 mm.  [place] ACUTE MI REVASCULARIZATION N/A 01/21/2018   Procedure: Coronary/Graft Acute MI Revascularization;  Surgeon: Leonie Man, MD;  Location: [facility];  Service: Cardiovascular;  Laterality: N/A;  attempted revas to distal CFX;   . CORONARY/GRAFT ACUTE MI REVASCULARIZATION N/A 09/04/2018   Procedure: Coronary/Graft Acute MI  Revascularization;  Surgeon: Sherren Mocha, MD;  Location: [facility]:: PTCA/POBA of rPDA (2.0 mm balloon)  . CYSTOSCOPY W/ RETROGRADES Bilateral 08/22/2015   Procedure: CYSTOSCOPY WITH RETROGRADE PYELOGRAM;  Surgeon: Irine Seal, MD;  Location: [facility];  Service: Urology;  Laterality: Bilateral;  . CYSTOSCOPY W/ RETROGRADES Bilateral 01/16/2016   Procedure: CYSTOSCOPY WITH RETROGRADE PYELOGRAM;  Surgeon: Irine Seal, MD;  Location: [facility];  Service: Urology;  Laterality: Bilateral;  . CYSTOSCOPY W/ RETROGRADES Bilateral 01/07/2017   Procedure: CYSTOSCOPY WITH RETROGRADE PYELOGRAM;  Surgeon: Irine Seal, MD;  Location: [facility];  Service: Urology;  Laterality: Bilateral;  . CYSTOSCOPY WITH BIOPSY N/A 08/22/2015   Procedure: CYSTOSCOPY WITH BLADDER BIOPSY;  Surgeon: Irine Seal, MD;  Location: [facility];  Service: Urology;  Laterality: N/A;  . CYSTOSCOPY WITH BIOPSY N/A 01/16/2016   Procedure: CYSTOSCOPY WITH BLADDER BIOPSY;  Surgeon: Irine Seal, MD;  [facility]: [facility];  Service: Urology;  Laterality: N/A;  . CYSTOSCOPY WITH FULGERATION N/A 01/16/2016   Procedure: CYSTOSCOPY WITH FULGERATION;  Surgeon: Irine Seal, MD;  [facility]: [facility];  Service: Urology;  Laterality: N/A;    . KNEE ARTHROSCOPY Right 1989  . LEFT HEART CATH AND CORONARY ANGIOGRAPHY N/A 01/21/2018   Procedure: LEFT HEART CATH AND CORONARY ANGIOGRAPHY;  Surgeon: Leonie Man, MD;  Location: [facility];  Service: Cardiovascular;; 95% RI-DES PCI.  80% o-pCX (PTCA) -> dCX 80%-100% CTO (unsuccessful PTCA unable to cross distal lesion with balloon.)  100% CTO dLAD. RPDA 80% and 70% as well as P AV 180%, PA V2 60%.  (too small for PCI)  . LEFT HEART CATH AND CORONARY ANGIOGRAPHY N/A 09/04/2018   Procedure: LEFT HEART CATH AND CORONARY ANGIOGRAPHY;  Surgeon: Sherren Mocha, MD;  Location: [facility]: (presumed Inferior STEMI) = progression of small rPDA -95% (PTCA only). Patent RI stent. CTO of apical LAD & mCx.  Marland Kitchen ROTATOR CUFF REPAIR Bilateral 2000  . rt. leg fracture surgery    . TRANSTHORACIC ECHOCARDIOGRAM  01/21/2018   Mild LVH.  EF 50 and 55%.  Apical inferior hypokinesis.  Mid-apical anterolateral hypokinesis.  Normal diastolic function for age   . TRANSTHORACIC ECHOCARDIOGRAM  09/04/2018   -? Inf STEMI vs. Pericarditis:  Mildly reduced EF of 45 to 50%.  Impaired relaxation-GR 1 DD.  Severe hypokinesis of the anterolateral and inferolateral wall.  Small-moderate anterior pericardial effusion.  Moderate aortic sclerosis but no stenosis.  . TRANSURETHRAL RESECTION OF BLADDER TUMOR N/A 01/07/2017   Procedure: TRANSURETHRAL RESECTION OF BLADDER TUMOR (TURBT);  Surgeon: Irine Seal, MD;  Location: [facility];  Service: Urology;  Laterality: N/A;  . WISDOM TOOTH EXTRACTION     Family History  Problem Relation Age of Onset  . Diabetes Mother 85       type 1  . Stroke Mother   . Dementia Father   . Diabetes Brother   . Heart attack Brother 47  . Testicular cancer Brother   . Diabetes Brother   . Cancer Brother        testicular  . Colon cancer Neg Hx   . Colon polyps Neg Hx   . Esophageal cancer Neg Hx   . Rectal cancer Neg Hx   . Stomach cancer Neg Hx    Social History    Socioeconomic History  . Marital status: Divorced    Spouse name: Not on file  . Number of children: 1  . Years of education: some college  . Highest education level: Some  college, no degree  Occupational History  . Occupation: retired    Comment: Architect, some farming  Tobacco Use  . Smoking status: Current Every Day Smoker    Packs/day: 1.00    Years: 50.00    Pack years: 50.00    Types: Cigarettes  . Smokeless tobacco: Never Used  Substance and Sexual Activity  . Alcohol use: No    Comment: rarely  . Drug use: No  . Sexual activity: Never  Other Topics Concern  . Not on file  Social History Narrative   Andrew [name] is retired and lives alone. He has a daughter that lives locally that he sees regularly. He has a couple of horses and an outside dog. He enjoyed working outside and taking care of his horses.    Social Determinants of Health   Financial Resource Strain:   . Difficulty of Paying Living Expenses: Not on file  Food Insecurity:   . Worried About Charity fundraiser in the Last Year: Not on file  . Ran Out of Food in the Last Year: Not on file  Transportation Needs:   . Lack of Transportation (Medical): Not on file  . Lack of Transportation (Non-Medical): Not on file  Physical Activity:   . Days of Exercise per Week: Not on file  . Minutes of Exercise per Session: Not on file  Stress:   . Feeling of Stress : Not on file  Social Connections:   . Frequency of Communication with Friends and Family: Not on file  . Frequency of Social Gatherings with Friends and Family: Not on file  . Attends Religious Services: Not on file  . Active Member of Clubs or Organizations: Not on file  . Attends Archivist Meetings: Not on file  . Marital Status: Not on file    Activities of Daily Living In your present state of health, do you have any difficulty performing the following activities: 07/25/2019 09/04/2018  Hearing? Y N  Comment some hearing loss -  Vision?  Y N  Comment eye surgery in few weeks -  Difficulty concentrating or making decisions? Y N  Comment some memory trouble -  Walking or climbing stairs? Y N  Comment some trouble with walking, leg and foot trouble -  Dressing or bathing? Y N  Comment a little -  Doing errands, shopping? N Y  Conservation officer, nature and eating ? N -  Using the Toilet? N -  In the past six months, have you accidently leaked urine? Y -  Comment "old age leakage" -  Do you have problems with loss of bowel control? N -  Managing your Medications? N -  Managing your Finances? N -  Housekeeping or managing your Housekeeping? N -  Some recent data might be hidden    Patient Education/ Literacy How often do you need to have someone help you when you read instructions, pamphlets, or other written materials from your doctor or pharmacy?: 1 - Never What is the last grade level you completed in school?: 1 year college  Exercise Current Exercise Habits: The patient does not participate in regular exercise at present, Exercise limited by: orthopedic condition(s);cardiac condition(s)  Diet Patient reports consuming 2 meals a day and 2 snack(s) a day Patient reports that his primary diet is: Regular Patient reports that she does have regular access to food.   Depression Screen PHQ 2/9 Scores 07/25/2019 05/02/2019 02/27/2019 01/11/2019 07/24/2018 07/10/2018 04/10/2018  PHQ - 2 Score 0 4 5 1  0 0 0  PHQ- 9 Score - 10 11 - - - -     Fall Risk Fall Risk  07/25/2019 05/02/2019 03/01/2019 02/27/2019 01/11/2019  Falls in the past year? 1 0 0 0 1  Comment - - - - -  Number falls in past yr: 1 - 0 0 0  Comment - - - - -  Injury with Fall? 0 - 0 0 0  Comment - - - - -  Risk Factor Category  - - - - -  Risk for fall due to : History of fall(s) - Impaired balance/gait;Impaired mobility Impaired balance/gait;Impaired mobility -  Follow up Falls prevention discussed - Falls prevention discussed - -     Objective:  Andrew [name] seemed  alert and oriented and he participated appropriately during our telephone visit.  Blood Pressure Weight BMI  BP Readings from Last 3 Encounters:  05/02/19 97/64  03/28/19 117/68  01/11/19 99/60   Wt Readings from Last 3 Encounters:  05/02/19 195 lb 9.6 oz (88.7 kg)  03/28/19 197 lb (89.4 kg)  01/11/19 192 lb 12.8 oz (87.5 kg)   BMI Readings from Last 1 Encounters:  05/02/19 24.13 kg/m    *Unable to obtain current vital signs, weight, and BMI due to telephone visit type  Hearing/Vision  . Andrew [name] did not seem to have difficulty with hearing/understanding during the telephone conversation . Reports that he has had a formal eye exam by an eye care professional within the past year . Reports that he has not had a formal hearing evaluation within the past year *Unable to fully assess hearing and vision during telephone visit type  Cognitive Function: 6CIT Screen 07/25/2019  What Year? 0 points  What month? 0 points  What time? 0 points  Count back from 20 0 points  Months in reverse 0 points  Repeat phrase 0 points  Total Score 0   (Normal:0-7, Significant for Dysfunction: >8)  Normal Cognitive Function Screening: Yes   Immunization & Health Maintenance Record Immunization History  Administered Date(s) Administered  . Influenza, High Dose Seasonal PF 02/25/2016  . Influenza,inj,Quad PF,6+ Mos 03/14/2017    Health Maintenance  Topic Date Due  . TETANUS/TDAP  01/22/1970  . INFLUENZA VACCINE  08/22/2019 (Originally 12/23/2018)  . PNA vac Low Risk Adult (1 of 2 - PCV13) 07/24/2020 (Originally 01/23/2015)  . HEMOGLOBIN A1C  10/31/2019  . FOOT EXAM  05/01/2020  . OPHTHALMOLOGY EXAM  07/17/2020  . COLONOSCOPY  10/14/2020  . Hepatitis C Screening  Completed       Assessment  This is a routine wellness examination for [employer].  Health Maintenance: Due or Overdue Health Maintenance Due  Topic Date Due  . Samul Dada  01/22/1970    Andrew [name] does not need a  referral for Community Assistance: Care Management:   no Social Work:    no Prescription Assistance:  no Nutrition/Diabetes Education:  no   Plan:  Personalized Goals Goals Addressed            This Visit's Progress   . Patient Stated       07/25/2019 AWV Goal: Fall Prevention  . Over the next year, patient will decrease their risk for falls by: o Using assistive devices, such as a cane or walker, as needed o Identifying fall risks within their home and correcting them by: - Removing throw rugs - Adding handrails to stairs or ramps - Removing clutter and keeping a clear pathway throughout the home - Increasing  light, especially at night - Adding shower handles/bars - Raising toilet seat o Identifying potential personal risk factors for falls: - Medication side effects - Incontinence/urgency - Vestibular dysfunction - Hearing loss - Musculoskeletal disorders - Neurological disorders - Orthostatic hypotension        Personalized Health Maintenance & Screening Recommendations  Td vaccine  Lung Cancer Screening Recommended: yes (Low Dose CT Chest recommended if Age 72-80 years, 30 pack-year currently smoking OR have quit w/in past 15 years) Hepatitis C Screening recommended: no HIV Screening recommended: no  Advanced Directives: Written information was not prepared per patient's request.  Referrals & Orders No orders of the defined types were placed in this encounter.   Follow-up Plan . Follow-up with Dettinger, Fransisca Kaufmann, MD as planned .  .    I have personally reviewed and noted the following in the patient's chart:   . Medical and social history . Use of alcohol, tobacco or illicit drugs  . Current medications and supplements . Functional ability and status . Nutritional status . Physical activity . Advanced directives . List of other physicians . Hospitalizations, surgeries, and ER visits in previous 12 months . Vitals . Screenings to include  cognitive, depression, and falls . Referrals and appointments  In addition, I have reviewed and discussed with Andrew [name] certain preventive protocols, quality metrics, and best practice recommendations. A written personalized care plan for preventive services as well as general preventive health recommendations is available and can be mailed to the patient at his request.      Baldomero Lamy, LPN [id number]

## 2019-08-01 ENCOUNTER — Other Ambulatory Visit: Payer: Self-pay

## 2019-08-02 ENCOUNTER — Ambulatory Visit (INDEPENDENT_AMBULATORY_CARE_PROVIDER_SITE_OTHER): Payer: Medicare Other | Admitting: Family Medicine

## 2019-08-02 ENCOUNTER — Encounter: Payer: Self-pay | Admitting: Family Medicine

## 2019-08-02 VITALS — BP 110/66 | HR 70 | Temp 99.8°F | Ht 76.0 in | Wt 198.1 lb

## 2019-08-02 DIAGNOSIS — E785 Hyperlipidemia, unspecified: Secondary | ICD-10-CM

## 2019-08-02 DIAGNOSIS — M1990 Unspecified osteoarthritis, unspecified site: Secondary | ICD-10-CM

## 2019-08-02 DIAGNOSIS — E1159 Type 2 diabetes mellitus with other circulatory complications: Secondary | ICD-10-CM | POA: Diagnosis not present

## 2019-08-02 DIAGNOSIS — I10 Essential (primary) hypertension: Secondary | ICD-10-CM

## 2019-08-02 DIAGNOSIS — E119 Type 2 diabetes mellitus without complications: Secondary | ICD-10-CM

## 2019-08-02 LAB — BAYER DCA HB A1C WAIVED: HB A1C (BAYER DCA - WAIVED): 8.7 % — ABNORMAL HIGH (ref <7.0)

## 2019-08-02 MED ORDER — HYDROCODONE-ACETAMINOPHEN 5-325 MG PO TABS: 1.0000 | ORAL_TABLET | Freq: Three times a day (TID) | ORAL | 0 refills | Status: DC | PRN

## 2019-08-02 NOTE — Progress Notes (Signed)
BP 110/66   Pulse 70   Temp 99.8 F (37.7 C)   Ht 6' 4" (1.93 m)   Wt 198 lb 2 oz (89.9 kg)   SpO2 98%   BMI 24.12 kg/m    Subjective:   Patient ID: Andrew [name], male    DOB: December 11, 1950, 69 y.o.   MRN: [id number]  HPI: Andrew [name] is a 69 y.o. male presenting on 08/02/2019 for Medical Management of Chronic Issues and Diabetes   HPI Type 2 diabetes mellitus Patient comes in today for recheck of his diabetes. Patient has been currently taking Metformin and Jardiance. Patient is currently on an ACE inhibitor/ARB. Patient has seen an ophthalmologist this year. Patient denies any issues with their feet.   Hypertension Patient is currently on bisoprolol and Imdur and lisinopril, and their blood pressure today is 110/66. Patient denies any lightheadedness or dizziness. Patient denies headaches, blurred vision, chest pains, shortness of breath, or weakness. Denies any side effects from medication and is content with current medication.   Hyperlipidemia Patient is coming in for recheck of his hyperlipidemia. The patient is currently taking Crestor. They deny any issues with myalgias or history of liver damage from it. They deny any focal numbness or weakness or chest pain.   Pain assessment: Cause of pain-arthritis in knees and back Pain location-knees and back Pain on scale of 1-10-1 today Frequency-infrequently, mainly when he is very active What increases pain-working and activity and physical activity and lifting things per What makes pain Better-stretching and Tylenol and the occasional hydrocodone Effects on ADL -minimal Any change in general medical condition-none currently  Current opioids rx-hydrocodone 5-3 25 every 8 hours. # meds rx-60 Effectiveness of current meds-works well, only uses occasionally Adverse reactions form pain meds-none Morphine equivalent-15  Pill count performed-No Last drug screen -07/18/2018 ( high risk q35m moderate risk q675mlow risk yearly  ) Urine drug screen today- Yes [place]  If yes were their any concerning findings? -None    Pain contract signed on: Today  Relevant past medical, surgical, family and social history reviewed and updated as indicated. Interim medical history since our last visit reviewed. Allergies and medications reviewed and updated.  Review of Systems  Constitutional: Negative for chills and fever.  Eyes: Negative for visual disturbance.  Respiratory: Negative for shortness of breath and wheezing.   Cardiovascular: Negative for chest pain and leg swelling.  Musculoskeletal: Positive for arthralgias and back pain. Negative for gait problem.  Skin: Negative for rash.  Neurological: Negative for dizziness, weakness and light-headedness.  All other systems reviewed and are negative.   Per HPI unless specifically indicated above   Allergies as of 08/02/2019      Reactions   Tramadol Shortness Of Breath   And dizziness   Trulicity [dulaglutide] Swelling   Ankles and feet swell      Medication List       Accurate as of August 02, 2019 11:59 PM. If you have any questions, ask your nurse or doctor.        STOP taking these medications   sitaGLIPtin 100 MG tablet Commonly known as: JANUVIA Stopped by: JoFransisca Kaufmannettinger, MD     TAKE these medications   bisoprolol 5 MG tablet Commonly known as: ZEBETA Take 0.5 tablets (2.5 mg total) by mouth daily. Take at 2pm   clopidogrel 75 MG tablet Commonly known as: Plavix Take 1 tablet (75 mg total) by mouth daily.   empagliflozin 25 MG Tabs  tablet Commonly known as: JARDIANCE Take 25 mg by mouth daily.   fenofibrate 48 MG tablet Commonly known as: Tricor Take 1 tablet (48 mg total) by mouth daily.   HYDROcodone-acetaminophen 5-325 MG tablet Commonly known as: NORCO/VICODIN Take 1 tablet by mouth every 8 (eight) hours as needed for moderate pain or severe pain.   isosorbide mononitrate 30 MG 24 hr tablet Commonly  known as: IMDUR Take 0.5 tablets (15 mg total) by mouth daily.   ketorolac 0.5 % ophthalmic solution Commonly known as: ACULAR 1 drop 4 (four) times daily.   lisinopril 2.5 MG tablet Commonly known as: ZESTRIL Take 1 tablet (2.5 mg total) by mouth daily.   metFORMIN 1000 MG tablet Commonly known as: GLUCOPHAGE Take 1 tablet by mouth 2 times daily with a meal.   moxifloxacin 0.5 % ophthalmic solution Commonly known as: VIGAMOX Apply 1 drop to eye 4 (four) times daily.   nitroGLYCERIN 0.4 MG SL tablet Commonly known as: NITROSTAT Place 1 tablet (0.4 mg total) under the tongue every 5 (five) minutes as needed for chest pain.   pantoprazole 40 MG tablet Commonly known as: PROTONIX Take 1 tablet (40 mg total) by mouth daily.   prednisoLONE acetate 1 % ophthalmic suspension Commonly known as: PRED FORTE Place 1 drop into the left eye 4 (four) times daily.   primidone 50 MG tablet Commonly known as: MYSOLINE Take 1 tablet (50 mg total) by mouth 4 (four) times daily.   rosuvastatin 40 MG tablet Commonly known as: CRESTOR Take 1 tablet (40 mg total) by mouth daily.   tamsulosin 0.4 MG Caps capsule Commonly known as: FLOMAX Take 1 capsule (0.4 mg total) by mouth daily.        Objective:   BP 110/66   Pulse 70   Temp 99.8 F (37.7 C)   Ht 6' 4" (1.93 m)   Wt 198 lb 2 oz (89.9 kg)   SpO2 98%   BMI 24.12 kg/m   Wt Readings from Last 3 Encounters:  08/02/19 198 lb 2 oz (89.9 kg)  05/02/19 195 lb 9.6 oz (88.7 kg)  03/28/19 197 lb (89.4 kg)    Physical Exam Vitals and nursing note reviewed.  Constitutional:      General: He is not in acute distress.    Appearance: He is well-developed. He is not diaphoretic.  Eyes:     General: No scleral icterus.    Conjunctiva/sclera: Conjunctivae normal.  Neck:     Thyroid: No thyromegaly.  Cardiovascular:     Rate and Rhythm: Normal rate and regular rhythm.     Heart sounds: Normal heart sounds. No murmur.  Pulmonary:       Effort: Pulmonary effort is normal. No respiratory distress.     Breath sounds: Normal breath sounds. No wheezing.  Musculoskeletal:        General: Normal range of motion.     Cervical back: Neck supple.  Lymphadenopathy:     Cervical: No cervical adenopathy.  Skin:    General: Skin is warm and dry.     Findings: No rash.  Neurological:     Mental Status: He is alert and oriented to person, place, and time.     Coordination: Coordination normal.  Psychiatric:        Behavior: Behavior normal.     Results for orders placed or performed in visit on 08/02/19  Bayer DCA Hb A1c Waived  Result Value Ref Range   HB A1C (BAYER DCA - WAIVED)  8.7 (H) <7.0 %  CBC with Differential/Platelet  Result Value Ref Range   WBC 8.1 3.4 - 10.8 x10E3/uL   RBC 5.34 4.14 - 5.80 x10E6/uL   Hemoglobin 16.4 13.0 - 17.7 g/dL   Hematocrit 49.0 37.5 - 51.0 %   MCV 92 79 - 97 fL   MCH 30.7 26.6 - 33.0 pg   MCHC 33.5 31.5 - 35.7 g/dL   RDW 12.9 11.6 - 15.4 %   Platelets 151 150 - 450 x10E3/uL   Neutrophils 59 Not Estab. %   Lymphs 25 Not Estab. %   Monocytes 9 Not Estab. %   Eos 5 Not Estab. %   Basos 1 Not Estab. %   Neutrophils Absolute 4.9 1.4 - 7.0 x10E3/uL   Lymphocytes Absolute 2.0 0.7 - 3.1 x10E3/uL   Monocytes Absolute 0.8 0.1 - 0.9 x10E3/uL   EOS (ABSOLUTE) 0.4 0.0 - 0.4 x10E3/uL   Basophils Absolute 0.1 0.0 - 0.2 x10E3/uL   Immature Granulocytes 1 Not Estab. %   Immature Grans (Abs) 0.0 0.0 - 0.1 x10E3/uL  CMP14+EGFR  Result Value Ref Range   Glucose 153 (H) 65 - 99 mg/dL   BUN 16 8 - 27 mg/dL   Creatinine, Ser 0.87 0.76 - 1.27 mg/dL   GFR calc non Af Amer 88 >59 mL/min/1.73   GFR calc Af Amer 102 >59 mL/min/1.73   BUN/Creatinine Ratio 18 10 - 24   Sodium 138 134 - 144 mmol/L   Potassium 4.8 3.5 - 5.2 mmol/L   Chloride 103 96 - 106 mmol/L   CO2 19 (L) 20 - 29 mmol/L   Calcium 9.9 8.6 - 10.2 mg/dL   Total Protein 7.1 6.0 - 8.5 g/dL   Albumin 4.5 3.8 - 4.8 g/dL   Globulin,  Total 2.6 1.5 - 4.5 g/dL   Albumin/Globulin Ratio 1.7 1.2 - 2.2   Bilirubin Total <0.2 0.0 - 1.2 mg/dL   Alkaline Phosphatase 61 39 - 117 IU/L   AST 10 0 - 40 IU/L   ALT 6 0 - 44 IU/L  Lipid panel  Result Value Ref Range   Cholesterol, Total 105 100 - 199 mg/dL   Triglycerides 208 (H) 0 - 149 mg/dL   HDL 36 (L) >39 mg/dL   VLDL Cholesterol Cal 33 5 - 40 mg/dL   LDL Chol Calc (NIH) 36 0 - 99 mg/dL   Chol/HDL Ratio 2.9 0.0 - 5.0 ratio    Assessment & Plan:   Problem List Items Addressed This Visit      Cardiovascular and Mediastinum   Type 2 diabetes mellitus with circulatory disorder, without long-term current use of insulin (HCC) (Chronic)   Relevant Orders   CBC with Differential/Platelet (Completed)   Essential hypertension (Chronic)     Other   Hyperlipidemia with target LDL less than 70 (Chronic)   Relevant Orders   Lipid panel (Completed)    Other Visit Diagnoses    Type 2 diabetes mellitus without complication, without long-term current use of insulin (HCC)    -  Primary   Relevant Orders   Bayer DCA Hb A1c Waived (Completed)   CMP14+EGFR (Completed)   Lipid panel (Completed)   Arthritis       Relevant Medications   HYDROcodone-acetaminophen (NORCO/VICODIN) 5-325 MG tablet   Other Relevant Orders   ToxASSURE Select 13 (MW), Urine      Refill hydrocodone, continue current medication other than that, Follow up plan: Return in about 3 months (around 11/02/2019), or if symptoms worsen  or fail to improve, for Diabetes and hypertension recheck.  Counseling provided for all of the vaccine components Orders Placed This Encounter  Procedures  . Bayer DCA Hb A1c Waived  . ToxASSURE Select 13 (MW), Urine  . CBC with Differential/Platelet  . CMP14+EGFR  . Lipid panel    Caryl Pina, MD [facility] 08/08/2019, 10:33 PM

## 2019-08-03 LAB — CBC WITH DIFFERENTIAL/PLATELET
Basophils Absolute: 0.1 10*3/uL (ref 0.0–0.2)
Basos: 1 %
EOS (ABSOLUTE): 0.4 10*3/uL (ref 0.0–0.4)
Eos: 5 %
Hematocrit: 49 % (ref 37.5–51.0)
Hemoglobin: 16.4 g/dL (ref 13.0–17.7)
Immature Grans (Abs): 0 10*3/uL (ref 0.0–0.1)
Immature Granulocytes: 1 %
Lymphocytes Absolute: 2 10*3/uL (ref 0.7–3.1)
Lymphs: 25 %
MCH: 30.7 pg (ref 26.6–33.0)
MCHC: 33.5 g/dL (ref 31.5–35.7)
MCV: 92 fL (ref 79–97)
Monocytes Absolute: 0.8 10*3/uL (ref 0.1–0.9)
Monocytes: 9 %
Neutrophils Absolute: 4.9 10*3/uL (ref 1.4–7.0)
Neutrophils: 59 %
Platelets: 151 10*3/uL (ref 150–450)
RBC: 5.34 x10E6/uL (ref 4.14–5.80)
RDW: 12.9 % (ref 11.6–15.4)
WBC: 8.1 10*3/uL (ref 3.4–10.8)

## 2019-08-03 LAB — CMP14+EGFR
ALT: 6 IU/L (ref 0–44)
AST: 10 IU/L (ref 0–40)
Albumin/Globulin Ratio: 1.7 (ref 1.2–2.2)
Albumin: 4.5 g/dL (ref 3.8–4.8)
Alkaline Phosphatase: 61 IU/L (ref 39–117)
BUN/Creatinine Ratio: 18 (ref 10–24)
BUN: 16 mg/dL (ref 8–27)
Bilirubin Total: <0.2 mg/dL (ref 0.0–1.2)
CO2: 19 mmol/L — ABNORMAL LOW (ref 20–29)
Calcium: 9.9 mg/dL (ref 8.6–10.2)
Chloride: 103 mmol/L (ref 96–106)
Creatinine, Ser: 0.87 mg/dL (ref 0.76–1.27)
GFR calc Af Amer: 102 mL/min/{1.73_m2} (ref >59)
GFR calc non Af Amer: 88 mL/min/{1.73_m2} (ref >59)
Globulin, Total: 2.6 g/dL (ref 1.5–4.5)
Glucose: 153 mg/dL — ABNORMAL HIGH (ref 65–99)
Potassium: 4.8 mmol/L (ref 3.5–5.2)
Sodium: 138 mmol/L (ref 134–144)
Total Protein: 7.1 g/dL (ref 6.0–8.5)

## 2019-08-03 LAB — LIPID PANEL
Chol/HDL Ratio: 2.9 ratio (ref 0.0–5.0)
Cholesterol, Total: 105 mg/dL (ref 100–199)
HDL: 36 mg/dL — ABNORMAL LOW (ref >39)
LDL Chol Calc (NIH): 36 mg/dL (ref 0–99)
Triglycerides: 208 mg/dL — ABNORMAL HIGH (ref 0–149)
VLDL Cholesterol Cal: 33 mg/dL (ref 5–40)

## 2019-08-09 ENCOUNTER — Encounter: Payer: Self-pay | Admitting: Cardiology

## 2019-08-09 ENCOUNTER — Other Ambulatory Visit: Payer: Self-pay

## 2019-08-09 ENCOUNTER — Ambulatory Visit (INDEPENDENT_AMBULATORY_CARE_PROVIDER_SITE_OTHER): Payer: Medicare Other | Admitting: Cardiology

## 2019-08-09 VITALS — BP 110/58 | HR 75 | Temp 97.3°F | Ht 76.0 in | Wt 195.2 lb

## 2019-08-09 DIAGNOSIS — Z955 Presence of coronary angioplasty implant and graft: Secondary | ICD-10-CM | POA: Diagnosis not present

## 2019-08-09 DIAGNOSIS — I200 Unstable angina: Secondary | ICD-10-CM

## 2019-08-09 DIAGNOSIS — I25119 Atherosclerotic heart disease of native coronary artery with unspecified angina pectoris: Secondary | ICD-10-CM

## 2019-08-09 DIAGNOSIS — I10 Essential (primary) hypertension: Secondary | ICD-10-CM

## 2019-08-09 DIAGNOSIS — Z8679 Personal history of other diseases of the circulatory system: Secondary | ICD-10-CM | POA: Diagnosis not present

## 2019-08-09 DIAGNOSIS — E785 Hyperlipidemia, unspecified: Secondary | ICD-10-CM

## 2019-08-09 DIAGNOSIS — F17200 Nicotine dependence, unspecified, uncomplicated: Secondary | ICD-10-CM

## 2019-08-09 MED ORDER — NITROGLYCERIN 0.4 MG SL SUBL: 0.4000 mg | SUBLINGUAL_TABLET | SUBLINGUAL | 6 refills | Status: DC | PRN

## 2019-08-09 MED ORDER — ISOSORBIDE MONONITRATE ER 30 MG PO TB24: 30.0000 mg | ORAL_TABLET | Freq: Every day | ORAL | 3 refills | Status: AC

## 2019-08-09 NOTE — Progress Notes (Signed)
Primary Care Provider: Dettinger, Fransisca Kaufmann, MD Cardiologist: Glenetta Hew, MD Electrophysiologist: None  Clinic Note: Chief Complaint  Patient presents with  . Follow-up    Doing well  . Coronary Artery Disease    No further angina  . Chest Pain    No further pericarditis pain    HPI:    Andrew [name] is a 69 y.o. male with a PMH notable for CAD having PCI (presentation was as a possible STEMI, no obvious culprit lesion found) subsequent presentation with myopericarditis with recurrence of symptoms treated with colchicine and NSAIDs followed by colchicine and prednisone.  Andrew [name] presents today for 42-month follow-up.  Andrew [name] was last seen by me on September 20, 2018 as a follow-up visit from his April 21 telehealth call.  He had persistent chest pain and had bounce back to the emergency room.  While in emergency room I started him on prednisone taper in addition to prednisone.  We provided Norco for breakthrough pain.  Intention was to avoid NSAIDs because of his antiplatelet agent-having just had PTCA of his PDA earlier that month.. --> Was near the end of his course of prednisone and was feeling definitely better.  No further anginal pain.  He had some mild fatigue and weakness but much improved pleuritic pain.  Completed prednisone taper and continue colchicine for 75-month course.  Plan was to wean off ACE inhibitor due to low blood pressure -> he was to reduce to 1/2 tablet daily and if continue to feel dizzy, to completely stop.  Noted that he converted from Brilinta to Plavix for financial reasons.  He was actually seen by Dr. Harrell Gave in my stead on March 28, 2019 (was a DOD visit, patient was unable to keep his original appointment).  At that time he denied any chest pain or dyspnea with rest or exertion.  Doing relatively well.  Palpitations controlled.  Red flag warning signs for myopericarditis discussed.  No longer on prednisone or colchicine.  Recent  Hospitalizations: None  Reviewed  CV studies:    The following studies were reviewed today: (if available, images/films reviewed: From Epic Chart or Care Everywhere) . No new studies:   Interval History:   Andrew [name] returns here today overall doing fairly well.  He is due to have eye surgery at the end of the month and had some questions about preop evaluation.  From cardiac standpoint he seems to be doing okay he does have some intermittent chest discomfort episodes when he gets stressed or overwhelmed we worked up about some Stressful situation.  But for the most part denies any anginal chest pain with rest or exertion.  He has not had any further pleuritic pain unless he has prolonged coughing.  He is not very active, but with what he does he does relatively well.  No claudication.  CV Review of Symptoms (Summary): positive for - Off and on episodes of chest discomfort that is not necessarily associate with any particular activity.  Not like his pleuritic pain or angina.  Has not taken nitroglycerin.  Some fatigue, but not nearly as much dizziness. negative for - dyspnea on exertion, edema, irregular heartbeat, orthopnea, palpitations, paroxysmal nocturnal dyspnea, rapid heart rate, shortness of breath or Syncope/near syncope, TIA/amaurosis fugax, claudication  The patient does not have symptoms concerning for COVID-19 infection (fever, chills, cough, or new shortness of breath).  The patient is practicing social distancing & Masking. We discussed COVID-19 vaccine.  He is somewhat conspiracy theory minded and is  reluctant at this point.   REVIEWED OF SYSTEMS   Review of Systems  Constitutional: Negative for malaise/fatigue (Not a lot of energy, but not excess fatigue.).  HENT: Negative for congestion and nosebleeds.   Eyes:       Has pending eye surgery.  Respiratory: Negative for shortness of breath (Nothing worse than baseline).   Gastrointestinal: Negative for abdominal pain,  blood in stool, heartburn and melena.  Genitourinary: Negative for hematuria.  Musculoskeletal: Positive for joint pain. Negative for falls.  Neurological: Positive for headaches. Negative for dizziness and weakness.  Endo/Heme/Allergies: Negative for environmental allergies.  Psychiatric/Behavioral: Positive for memory loss (A little bit.  Not great historian.). Negative for depression. The patient does not have insomnia.     I have reviewed and (if needed) personally updated the patient's problem list, medications, allergies, past medical and surgical history, social and family history.     PAST MEDICAL HISTORY   Past Medical History:  Diagnosis Date  . Acute viral pericarditis 09/03/2018   Inferior STE - but Negative Troponin.  CP &SOB.  Felt to be Viral.  . Anemia   . Arthritis   . Bladder cancer ([place])   . Depression   . Diabetes mellitus without complication ([place])   . Essential hypertension 01/20/2018   in the past no longer on medication  . GERD (gastroesophageal reflux disease)   . Hyperlipidemia with target LDL less than 70 12/19/2015  . Multivessel CAD - CTO dLAD, mCx. DES PCI RI, PTCA of RPDA 01/21/2018   12/2017 - Cath for ? STEMI -> distal/apical LAD & m-dCx CTO (unable to cross Cx).  Mod rPDA. Severe RI - DES PCI.  08/2018 - Cath for ? Inf STEMI - RI stent patent & CTO dLAD/mCx. Progression of rPDA 95% -> PTCA only.  Thought to be Pericarditis & not MI (troponin negative).   . Neuromuscular disorder ([place])   . Sleep apnea    BiPAP not currently being used  . Status post tendon repair 1989  . STEMI (ST elevation myocardial infarction) ([place]) 01/20/2018   Cardiac cath January 21, 2018: Severe multivessel disease with unclear lesion, but opted for PCI/DES x1 to the RI.  Appeared to have CTO of the distal LAD and circumflex as well as moderate mid LAD and diagonal disease as well as PDA.Andrew [name]  Unable to cross circumflex lesion.  . Stroke ([place])    At times pt has dizziness with loss  of vision  . Tremors of nervous system 2011    PAST SURGICAL HISTORY   Past Surgical History:  Procedure Laterality Date  . APPENDECTOMY    . BLADDER SURGERY    . COLONOSCOPY    . CORONARY STENT INTERVENTION N/A 01/21/2018   Procedure: CORONARY STENT INTERVENTION;  Surgeon: Leonie Man, MD;  Location: [facility];  Service: Cardiovascular;  Laterality: N/A;  95% Ramus Intermedius -PCI with synergy DES 2.25 mm x 12 mm postdilated 2.4 mm.  [place] [place] ACUTE MI REVASCULARIZATION N/A 01/21/2018   Procedure: Coronary/Graft Acute MI Revascularization;  Surgeon: Leonie Man, MD;  Location: [place] [facility];  Service: Cardiovascular;  Laterality: N/A;  attempted revas to distal CFX;   . CORONARY/GRAFT ACUTE MI REVASCULARIZATION N/A 09/04/2018   Procedure: Coronary/Graft Acute MI Revascularization;  Surgeon: Sherren Mocha, MD;  Location: [place] [facility]:: PTCA/POBA of rPDA (2.0 mm balloon)  . CYSTOSCOPY W/ RETROGRADES Bilateral 08/22/2015   Procedure: CYSTOSCOPY WITH RETROGRADE PYELOGRAM;  Surgeon: Irine Seal, MD;  [facility];  Service: Urology;  Laterality: Bilateral;  . CYSTOSCOPY W/ RETROGRADES Bilateral 01/16/2016   Procedure: CYSTOSCOPY WITH RETROGRADE PYELOGRAM;  Surgeon: Irine Seal, MD;  Location: [facility];  Service: Urology;  Laterality: Bilateral;  . CYSTOSCOPY W/ RETROGRADES Bilateral 01/07/2017   Procedure: CYSTOSCOPY WITH RETROGRADE PYELOGRAM;  Surgeon: Irine Seal, MD;  Location: [facility];  Service: Urology;  Laterality: Bilateral;  . CYSTOSCOPY WITH BIOPSY N/A 08/22/2015   Procedure: CYSTOSCOPY WITH BLADDER BIOPSY;  Surgeon: Irine Seal, MD;  Location: [facility];  Service: Urology;  Laterality: N/A;  . CYSTOSCOPY WITH BIOPSY N/A 01/16/2016   Procedure: CYSTOSCOPY WITH BLADDER BIOPSY;  Surgeon: Irine Seal, MD;  Location: [facility];  Service: Urology;  Laterality: N/A;  . CYSTOSCOPY WITH FULGERATION N/A 01/16/2016   Procedure: CYSTOSCOPY WITH FULGERATION;  Surgeon: Irine Seal, MD;  Location: [facility];  Service: Urology;  Laterality: N/A;  . KNEE ARTHROSCOPY Right 1989  . LEFT HEART CATH AND CORONARY ANGIOGRAPHY N/A 01/21/2018   Procedure: LEFT HEART CATH AND CORONARY ANGIOGRAPHY;  Surgeon: Leonie Man, MD;  Location: [facility];  Service: Cardiovascular;; 95% RI-DES PCI.  80% o-pCX (PTCA) -> dCX 80%-100% CTO (unsuccessful PTCA unable to cross distal lesion with balloon.)  100% CTO dLAD. RPDA 80% and 70% as well as P AV 180%, PA V2 60%.  (too small for PCI)  . LEFT HEART CATH AND CORONARY ANGIOGRAPHY N/A 09/04/2018   Procedure: LEFT HEART CATH AND CORONARY ANGIOGRAPHY;  Surgeon: Sherren Mocha, MD;  Location: [place] [facility]: (presumed Inferior STEMI) = progression of small rPDA -95% (PTCA only). Patent RI stent. CTO of apical LAD & mCx.  Andrew [name] ROTATOR CUFF REPAIR Bilateral 2000  . rt. leg fracture surgery    . TRANSTHORACIC ECHOCARDIOGRAM  01/21/2018   Mild LVH.  EF 50 and 55%.  Apical inferior hypokinesis.  Mid-apical anterolateral hypokinesis.  Normal diastolic function for age   . TRANSTHORACIC ECHOCARDIOGRAM  09/04/2018   -? Inf STEMI vs. Pericarditis:  Mildly reduced EF of 45 to 50%.  Impaired relaxation-GR 1 DD.  Severe hypokinesis of the anterolateral and inferolateral wall.  Small-moderate anterior pericardial effusion.  Moderate aortic sclerosis but no stenosis.  . TRANSURETHRAL RESECTION OF BLADDER TUMOR N/A 01/07/2017   Procedure: TRANSURETHRAL RESECTION OF BLADDER TUMOR (TURBT);  Surgeon: Irine Seal, MD;  Location: [facility];  Service: Urology;  Laterality: N/A;  . WISDOM TOOTH EXTRACTION     Diagnostic 4/13/2020Intervention - PDA PTCA   MEDICATIONS/ALLERGIES   Current Meds  Medication Sig  . bisoprolol (ZEBETA) 5 MG tablet Take 0.5 tablets (2.5 mg total) by mouth daily. Take at 2pm  . clopidogrel (PLAVIX) 75 MG tablet Take 1 tablet (75 mg total) by mouth daily.  . empagliflozin (JARDIANCE) 25 MG  TABS tablet Take 25 mg by mouth daily.  . fenofibrate (TRICOR) 48 MG tablet Take 1 tablet (48 mg total) by mouth daily.  Andrew [name] HYDROcodone-acetaminophen (NORCO/VICODIN) 5-325 MG tablet Take 1 tablet by mouth every 8 (eight) hours as needed for moderate pain or severe pain.  . isosorbide mononitrate (IMDUR) 30 MG 24 hr tablet Take 1 tablet (30 mg total) by mouth daily.  Andrew [name] ketorolac (ACULAR) 0.5 % ophthalmic solution 1 drop 4 (four) times daily.  Andrew [name] lisinopril (ZESTRIL) 2.5 MG tablet Take 1 tablet (2.5 mg total) by mouth daily.  . metFORMIN (GLUCOPHAGE) 1000 MG tablet Take 1 tablet by mouth 2 times daily with a meal.  . moxifloxacin (VIGAMOX) 0.5 % ophthalmic solution Apply  1 drop to eye 4 (four) times daily.  . nitroGLYCERIN (NITROSTAT) 0.4 MG SL tablet Place 1 tablet (0.4 mg total) under the tongue every 5 (five) minutes as needed for chest pain.  . pantoprazole (PROTONIX) 40 MG tablet Take 1 tablet (40 mg total) by mouth daily.  . prednisoLONE acetate (PRED FORTE) 1 % ophthalmic suspension Place 1 drop into the left eye 4 (four) times daily.  . primidone (MYSOLINE) 50 MG tablet Take 1 tablet (50 mg total) by mouth 4 (four) times daily.  . tamsulosin (FLOMAX) 0.4 MG CAPS capsule Take 1 capsule (0.4 mg total) by mouth daily.  . [DISCONTINUED] isosorbide mononitrate (IMDUR) 30 MG 24 hr tablet Take 0.5 tablets (15 mg total) by mouth daily.  . [DISCONTINUED] nitroGLYCERIN (NITROSTAT) 0.4 MG SL tablet Place 1 tablet (0.4 mg total) under the tongue every 5 (five) minutes as needed for chest pain.    Allergies  Allergen Reactions  . Tramadol Shortness Of Breath    And dizziness  . Trulicity [Dulaglutide] Swelling    Ankles and feet swell    SOCIAL HISTORY/FAMILY HISTORY   Reviewed in Epic:  Pertinent findings: No new changes  OBJCTIVE -PE, EKG, labs   Wt Readings from Last 3 Encounters:  08/09/19 195 lb 3.2 oz (88.5 kg)  08/02/19 198 lb 2 oz (89.9 kg)  05/02/19 195 lb 9.6 oz (88.7 kg)     Physical Exam: BP (!) 110/58   Pulse 75   Temp (!) 97.3 F (36.3 C)   Ht 6\' 4"  (1.93 m)   Wt 195 lb 3.2 oz (88.5 kg)   SpO2 93%   BMI 23.76 kg/m  Physical Exam  Constitutional: He is oriented to person, place, and time. He appears well-developed and well-nourished. No distress.  Healthy-appearing.  Well-groomed  HENT:  Head: Normocephalic and atraumatic.  Neck: No hepatojugular reflux and no JVD present. Carotid bruit is not present.  Cardiovascular: Normal rate, regular rhythm and normal heart sounds.  No extrasystoles are present. PMI is not displaced. Exam reveals decreased pulses (Borderline diminished pedal pulses.  Otherwise stable). Exam reveals no gallop and no friction rub.  No murmur heard. Pulmonary/Chest: Effort normal and breath sounds normal. No respiratory distress. He has no wheezes. He has no rales.  Abdominal: Soft. Bowel sounds are normal. He exhibits no distension. There is no abdominal tenderness. There is no rebound.  Musculoskeletal:        General: Edema (Trivial 1+ bilateral LE) present. Normal range of motion.     Cervical back: Normal range of motion and neck supple.  Neurological: He is alert and oriented to person, place, and time.  Psychiatric: He has a normal mood and affect. His behavior is normal. Judgment and thought content normal.  Vitals reviewed.    Adult ECG Report  Rate: 75 ;  Rhythm: normal sinus rhythm and 1 degree AVB.  Otherwise normal axis, intervals, and durations.;   Narrative Interpretation: Stable EKG  Recent Labs: Just checked Lab Results  Component Value Date   CHOL 105 08/02/2019   HDL 36 (L) 08/02/2019   LDLCALC 36 08/02/2019   TRIG 208 (H) 08/02/2019   CHOLHDL 2.9 08/02/2019   Lab Results  Component Value Date   CREATININE 0.87 08/02/2019   BUN 16 08/02/2019   NA 138 08/02/2019   K 4.8 08/02/2019   CL 103 08/02/2019   CO2 19 (L) 08/02/2019   No results found for: TSH  ASSESSMENT/PLAN    Problem List  Items  Addressed This Visit    Multivessel CAD - CTO dLAD, mCx. DES PCI RI, PTCA of RPDA - Primary (Chronic)    Doing fairly well.  No real anginal symptoms at this point.  The symptoms he is having is probably more musculoskeletal than anything else.  Plan:   Continue low-dose bisoprolol and Imdur for antianginal benefit. ->    We will have him increase to full dose Imdur since he has had some atypical sounding chest pain episodes.  He never fully complete pleated the weaning off process of ACE inhibitor.  I instructed him if his blood pressures are low to hold this medication.  Continue high-dose rosuvastatin.  On empagliflozin  On Plavix without aspirin.  Okay to hold Plavix 5 to 7 days preop for surgeries if necessary.      Relevant Medications   nitroGLYCERIN (NITROSTAT) 0.4 MG SL tablet   isosorbide mononitrate (IMDUR) 30 MG 24 hr tablet   Other Relevant Orders   EKG 12-Lead (Completed)   Presence of drug coated stent in ramus intermedius coronary artery (Chronic)    Seems doing relatively well.  Is now on Plavix without aspirin.  Is now almost a year out from PTCA of the PDA.  Okay to hold Plavix 5 to 7 days preop for surgeries.      Relevant Orders   EKG 12-Lead (Completed)   Tobacco use disorder (Chronic)    Counseled, still having may be 1/4 pack/day.  Counseled on the importance of fully quitting.      Hyperlipidemia with target LDL less than 70 (Chronic)    Lipids look great on current dose of rosuvastatin.  Triglycerides are little high, but otherwise labs look great. Suspect triglycerides are elevated in relation to glycemic control.      Relevant Medications   nitroGLYCERIN (NITROSTAT) 0.4 MG SL tablet   isosorbide mononitrate (IMDUR) 30 MG 24 hr tablet   Essential hypertension (Chronic)   Relevant Medications   nitroGLYCERIN (NITROSTAT) 0.4 MG SL tablet   isosorbide mononitrate (IMDUR) 30 MG 24 hr tablet   Unstable angina (HCC)    No further anginal  symptoms at this point.  He had some chest discomfort a few weeks ago that I am not sure what it was.  He did not take a nitroglycerin.  I will make sure his nitroglycerin prescription is filled, but we will have him try to increase to full tablet of Imdur.      Relevant Medications   nitroGLYCERIN (NITROSTAT) 0.4 MG SL tablet   isosorbide mononitrate (IMDUR) 30 MG 24 hr tablet   Other Relevant Orders   EKG 12-Lead (Completed)   History of pericarditis    Thankfully, no more pleuritic or pericardial type chest pain.  They asked lots of questions about what this week occur etc.  I suspect that it probably will not, however unfortunately were not sure what the true etiology was.  Question was a viral versus post MI.  Has completed full course.  Doing well.  Last echo showed no significant pericardial fusion.          COVID-19 Education: The signs and symptoms of COVID-19 were discussed with the patient and how to seek care for testing (follow up with PCP or arrange E-visit).   The importance of social distancing was discussed today.  I spent a total of 22minutes with the patient. >  50% of the time was spent in direct patient consultation.  Additional time spent with chart review  /  charting (studies, outside notes, etc): 8 Total Time: 29 min   Current medicines are reviewed at length with the patient today.  (+/- concerns) n/a   Patient Instructions / Medication Changes & Studies & Tests Ordered   Patient Instructions  Medication Instructions:  Try to  Increase taking Imdur  (isosorbide) to 30 mg   A whole tablet .  If you can not tolerate the increase . If you use a nitroglycerin tablet , Dr Ellyn Hack would like for you to take 30 mg of the Imdur for 3 days after the chest discomfort.  *If you need a refill on your cardiac medications before your next appointment, please call your pharmacy*   Lab Work:  Not needed  Testing/Procedures: *not needed   Follow-Up: At [facility], you and your health needs are our priority.  As part of our continuing mission to provide you with exceptional heart care, we have created designated Provider Care Teams.  These Care Teams include your primary Cardiologist (physician) and Advanced Practice Providers (APPs -  Physician Assistants and Nurse Practitioners) who all work together to provide you with the care you need, when you need it.   Your next appointment:   6 month(s)-Sept 2021  The format for your next appointment:   In Person  Provider:   Glenetta Hew, MD   Other Instructions    Studies Ordered:   Orders Placed This Encounter  Procedures  . EKG 12-Lead     Glenetta Hew, M.D., M.S. Interventional Cardiologist   Pager # [phone] Phone # [phone] [address] [place], [place] [address]   Thank you for choosing Heartcare at [facility]!!

## 2019-08-09 NOTE — Patient Instructions (Signed)
Medication Instructions:  Try to  Increase taking Imdur  (isosorbide) to 30 mg   A whole tablet .  If you can not tolerate the increase . If you use a nitroglycerin tablet , Dr Ellyn Hack would like for you to take 30 mg of the Imdur for 3 days after the chest discomfort.  *If you need a refill on your cardiac medications before your next appointment, please call your pharmacy*   Lab Work:  Not needed  Testing/Procedures: *not needed   Follow-Up: At [facility], you and your health needs are our priority.  As part of our continuing mission to provide you with exceptional heart care, we have created designated Provider Care Teams.  These Care Teams include your primary Cardiologist (physician) and Advanced Practice Providers (APPs -  Physician Assistants and Nurse Practitioners) who all work together to provide you with the care you need, when you need it.   Your next appointment:   6 month(s)-Sept 2021  The format for your next appointment:   In Person  Provider:   Glenetta Hew, MD   Other Instructions

## 2019-08-11 DIAGNOSIS — Z01818 Encounter for other preprocedural examination: Secondary | ICD-10-CM | POA: Diagnosis not present

## 2019-08-14 ENCOUNTER — Encounter: Payer: Self-pay | Admitting: Cardiology

## 2019-08-14 DIAGNOSIS — K219 Gastro-esophageal reflux disease without esophagitis: Secondary | ICD-10-CM | POA: Diagnosis not present

## 2019-08-14 DIAGNOSIS — F17200 Nicotine dependence, unspecified, uncomplicated: Secondary | ICD-10-CM | POA: Diagnosis not present

## 2019-08-14 DIAGNOSIS — E1136 Type 2 diabetes mellitus with diabetic cataract: Secondary | ICD-10-CM | POA: Diagnosis not present

## 2019-08-14 DIAGNOSIS — E119 Type 2 diabetes mellitus without complications: Secondary | ICD-10-CM | POA: Diagnosis not present

## 2019-08-14 DIAGNOSIS — Z951 Presence of aortocoronary bypass graft: Secondary | ICD-10-CM | POA: Diagnosis not present

## 2019-08-14 DIAGNOSIS — Z955 Presence of coronary angioplasty implant and graft: Secondary | ICD-10-CM | POA: Diagnosis not present

## 2019-08-14 DIAGNOSIS — G4730 Sleep apnea, unspecified: Secondary | ICD-10-CM | POA: Diagnosis not present

## 2019-08-14 DIAGNOSIS — Z950 Presence of cardiac pacemaker: Secondary | ICD-10-CM | POA: Diagnosis not present

## 2019-08-14 DIAGNOSIS — I10 Essential (primary) hypertension: Secondary | ICD-10-CM | POA: Diagnosis not present

## 2019-08-14 DIAGNOSIS — Z7984 Long term (current) use of oral hypoglycemic drugs: Secondary | ICD-10-CM | POA: Diagnosis not present

## 2019-08-14 DIAGNOSIS — H2181 Floppy iris syndrome: Secondary | ICD-10-CM | POA: Diagnosis not present

## 2019-08-14 DIAGNOSIS — I2510 Atherosclerotic heart disease of native coronary artery without angina pectoris: Secondary | ICD-10-CM | POA: Diagnosis not present

## 2019-08-14 DIAGNOSIS — E039 Hypothyroidism, unspecified: Secondary | ICD-10-CM | POA: Diagnosis not present

## 2019-08-14 DIAGNOSIS — H25812 Combined forms of age-related cataract, left eye: Secondary | ICD-10-CM | POA: Diagnosis not present

## 2019-08-14 DIAGNOSIS — H52222 Regular astigmatism, left eye: Secondary | ICD-10-CM | POA: Diagnosis not present

## 2019-08-14 NOTE — Assessment & Plan Note (Addendum)
No further anginal symptoms at this point.  He had some chest discomfort a few weeks ago that I am not sure what it was.  He did not take a nitroglycerin.  I will make sure his nitroglycerin prescription is filled, but we will have him try to increase to full tablet of Imdur.

## 2019-08-14 NOTE — Assessment & Plan Note (Signed)
Counseled, still having may be 1/4 pack/day.  Counseled on the importance of fully quitting.

## 2019-08-14 NOTE — Assessment & Plan Note (Signed)
Lipids look great on current dose of rosuvastatin.  Triglycerides are little high, but otherwise labs look great. Suspect triglycerides are elevated in relation to glycemic control.

## 2019-08-14 NOTE — Assessment & Plan Note (Signed)
Thankfully, no more pleuritic or pericardial type chest pain.  They asked lots of questions about what this week occur etc.  I suspect that it probably will not, however unfortunately were not sure what the true etiology was.  Question was a viral versus post MI.  Has completed full course.  Doing well.  Last echo showed no significant pericardial fusion.

## 2019-08-14 NOTE — Assessment & Plan Note (Addendum)
Seems doing relatively well.  Is now on Plavix without aspirin.  Is now almost a year out from PTCA of the PDA.  Okay to hold Plavix 5 to 7 days preop for surgeries.

## 2019-08-14 NOTE — Assessment & Plan Note (Addendum)
Doing fairly well.  No real anginal symptoms at this point.  The symptoms he is having is probably more musculoskeletal than anything else.  Plan:   Continue low-dose bisoprolol and Imdur for antianginal benefit. ->    We will have him increase to full dose Imdur since he has had some atypical sounding chest pain episodes.  He never fully complete pleated the weaning off process of ACE inhibitor.  I instructed him if his blood pressures are low to hold this medication.  Continue high-dose rosuvastatin.  On empagliflozin  On Plavix without aspirin.  Okay to hold Plavix 5 to 7 days preop for surgeries if necessary.

## 2019-08-15 DIAGNOSIS — H25811 Combined forms of age-related cataract, right eye: Secondary | ICD-10-CM | POA: Diagnosis not present

## 2019-08-15 DIAGNOSIS — H353131 Nonexudative age-related macular degeneration, bilateral, early dry stage: Secondary | ICD-10-CM | POA: Diagnosis not present

## 2019-08-15 DIAGNOSIS — Z961 Presence of intraocular lens: Secondary | ICD-10-CM | POA: Diagnosis not present

## 2019-08-15 DIAGNOSIS — E119 Type 2 diabetes mellitus without complications: Secondary | ICD-10-CM | POA: Diagnosis not present

## 2019-08-15 DIAGNOSIS — H527 Unspecified disorder of refraction: Secondary | ICD-10-CM | POA: Diagnosis not present

## 2019-08-15 DIAGNOSIS — H52201 Unspecified astigmatism, right eye: Secondary | ICD-10-CM | POA: Diagnosis not present

## 2019-08-16 DIAGNOSIS — E1142 Type 2 diabetes mellitus with diabetic polyneuropathy: Secondary | ICD-10-CM | POA: Diagnosis not present

## 2019-08-16 DIAGNOSIS — B351 Tinea unguium: Secondary | ICD-10-CM | POA: Diagnosis not present

## 2019-08-16 DIAGNOSIS — M79676 Pain in unspecified toe(s): Secondary | ICD-10-CM | POA: Diagnosis not present

## 2019-08-16 DIAGNOSIS — L84 Corns and callosities: Secondary | ICD-10-CM | POA: Diagnosis not present

## 2019-08-18 DIAGNOSIS — Z20822 Contact with and (suspected) exposure to covid-19: Secondary | ICD-10-CM | POA: Diagnosis not present

## 2019-08-18 DIAGNOSIS — Z01812 Encounter for preprocedural laboratory examination: Secondary | ICD-10-CM | POA: Diagnosis not present

## 2019-08-18 DIAGNOSIS — H25811 Combined forms of age-related cataract, right eye: Secondary | ICD-10-CM | POA: Diagnosis not present

## 2019-08-21 DIAGNOSIS — H353131 Nonexudative age-related macular degeneration, bilateral, early dry stage: Secondary | ICD-10-CM | POA: Diagnosis not present

## 2019-08-21 DIAGNOSIS — I2510 Atherosclerotic heart disease of native coronary artery without angina pectoris: Secondary | ICD-10-CM | POA: Diagnosis not present

## 2019-08-21 DIAGNOSIS — I252 Old myocardial infarction: Secondary | ICD-10-CM | POA: Diagnosis not present

## 2019-08-21 DIAGNOSIS — F17200 Nicotine dependence, unspecified, uncomplicated: Secondary | ICD-10-CM | POA: Diagnosis not present

## 2019-08-21 DIAGNOSIS — K219 Gastro-esophageal reflux disease without esophagitis: Secondary | ICD-10-CM | POA: Diagnosis not present

## 2019-08-21 DIAGNOSIS — H527 Unspecified disorder of refraction: Secondary | ICD-10-CM | POA: Diagnosis not present

## 2019-08-21 DIAGNOSIS — I10 Essential (primary) hypertension: Secondary | ICD-10-CM | POA: Diagnosis not present

## 2019-08-21 DIAGNOSIS — H52203 Unspecified astigmatism, bilateral: Secondary | ICD-10-CM | POA: Diagnosis not present

## 2019-08-21 DIAGNOSIS — G4733 Obstructive sleep apnea (adult) (pediatric): Secondary | ICD-10-CM | POA: Diagnosis not present

## 2019-08-21 DIAGNOSIS — H25811 Combined forms of age-related cataract, right eye: Secondary | ICD-10-CM | POA: Diagnosis not present

## 2019-08-21 DIAGNOSIS — E1136 Type 2 diabetes mellitus with diabetic cataract: Secondary | ICD-10-CM | POA: Diagnosis not present

## 2019-08-21 DIAGNOSIS — H2181 Floppy iris syndrome: Secondary | ICD-10-CM | POA: Diagnosis not present

## 2019-08-21 DIAGNOSIS — G4730 Sleep apnea, unspecified: Secondary | ICD-10-CM | POA: Diagnosis not present

## 2019-08-21 DIAGNOSIS — Z955 Presence of coronary angioplasty implant and graft: Secondary | ICD-10-CM | POA: Diagnosis not present

## 2019-08-21 DIAGNOSIS — H52201 Unspecified astigmatism, right eye: Secondary | ICD-10-CM | POA: Diagnosis not present

## 2019-08-22 ENCOUNTER — Encounter: Payer: Self-pay | Admitting: Family Medicine

## 2019-08-22 DIAGNOSIS — Z961 Presence of intraocular lens: Secondary | ICD-10-CM | POA: Diagnosis not present

## 2019-08-22 DIAGNOSIS — E119 Type 2 diabetes mellitus without complications: Secondary | ICD-10-CM | POA: Diagnosis not present

## 2019-08-22 DIAGNOSIS — H527 Unspecified disorder of refraction: Secondary | ICD-10-CM | POA: Diagnosis not present

## 2019-08-22 DIAGNOSIS — H52201 Unspecified astigmatism, right eye: Secondary | ICD-10-CM | POA: Diagnosis not present

## 2019-08-22 DIAGNOSIS — H353131 Nonexudative age-related macular degeneration, bilateral, early dry stage: Secondary | ICD-10-CM | POA: Diagnosis not present

## 2019-08-22 LAB — HM DIABETES EYE EXAM

## 2019-09-21 ENCOUNTER — Encounter: Payer: Self-pay | Admitting: Family Medicine

## 2019-09-21 DIAGNOSIS — H43811 Vitreous degeneration, right eye: Secondary | ICD-10-CM | POA: Diagnosis not present

## 2019-09-21 DIAGNOSIS — H353131 Nonexudative age-related macular degeneration, bilateral, early dry stage: Secondary | ICD-10-CM | POA: Diagnosis not present

## 2019-09-21 DIAGNOSIS — E119 Type 2 diabetes mellitus without complications: Secondary | ICD-10-CM | POA: Diagnosis not present

## 2019-09-21 DIAGNOSIS — H527 Unspecified disorder of refraction: Secondary | ICD-10-CM | POA: Diagnosis not present

## 2019-09-21 DIAGNOSIS — Z961 Presence of intraocular lens: Secondary | ICD-10-CM | POA: Diagnosis not present

## 2019-09-21 DIAGNOSIS — H26493 Other secondary cataract, bilateral: Secondary | ICD-10-CM | POA: Diagnosis not present

## 2019-09-21 DIAGNOSIS — H52201 Unspecified astigmatism, right eye: Secondary | ICD-10-CM | POA: Diagnosis not present

## 2019-09-21 LAB — HM DIABETES EYE EXAM

## 2019-10-10 ENCOUNTER — Other Ambulatory Visit: Payer: Medicare Other | Admitting: Urology

## 2019-10-15 ENCOUNTER — Ambulatory Visit (INDEPENDENT_AMBULATORY_CARE_PROVIDER_SITE_OTHER): Payer: Medicare Other | Admitting: Urology

## 2019-10-15 ENCOUNTER — Other Ambulatory Visit: Payer: Self-pay

## 2019-10-15 ENCOUNTER — Encounter: Payer: Self-pay | Admitting: Urology

## 2019-10-15 VITALS — BP 109/57 | HR 82 | Temp 97.9°F | Ht 74.0 in | Wt 193.0 lb

## 2019-10-15 DIAGNOSIS — C679 Malignant neoplasm of bladder, unspecified: Secondary | ICD-10-CM

## 2019-10-15 DIAGNOSIS — I200 Unstable angina: Secondary | ICD-10-CM | POA: Diagnosis not present

## 2019-10-15 LAB — POCT URINALYSIS DIPSTICK
Bilirubin, UA: NEGATIVE
Blood, UA: NEGATIVE
Glucose, UA: POSITIVE — AB
Ketones, UA: NEGATIVE
Leukocytes, UA: NEGATIVE
Nitrite, UA: NEGATIVE
Protein, UA: NEGATIVE
Spec Grav, UA: 1.02 (ref 1.010–1.025)
Urobilinogen, UA: 0.2 E.U./dL
pH, UA: 5 (ref 5.0–8.0)

## 2019-10-15 MED ORDER — CIPROFLOXACIN HCL 500 MG PO TABS
500.0000 mg | ORAL_TABLET | Freq: Once | ORAL | Status: AC
  Administered 2019-10-15: 500 mg via ORAL

## 2019-10-15 NOTE — Patient Instructions (Signed)
Bladder Cancer  Bladder cancer is an abnormal growth of tissue in the bladder. The bladder is the balloon-like sac in the pelvis. It collects and stores urine that comes from the kidneys through the ureters. The bladder wall is made of layers. If cancer spreads into these layers and through the wall of the bladder, it becomes more difficult to treat. What are the causes? The cause of this condition is not known. What increases the risk? The following factors may make you more likely to develop this condition:  Smoking.  Workplace risks (occupational exposures), such as rubber, leather, textile, dyes, chemicals, and paint.  Being white.  Your age. Most people with bladder cancer are over the age of 55.  Being male.  Having chronic bladder inflammation.  Having a personal history of bladder cancer.  Having a family history of bladder cancer (heredity).  Having had chemotherapy or radiation therapy to the pelvis.  Having been exposed to arsenic. What are the signs or symptoms? Initial symptoms of this condition include:  Blood in the urine.  Painful urination.  Frequent bladder or urine infections.  Increase in urgency and frequency of urination. Advanced symptoms of this condition include:  Not being able to urinate.  Low back pain on one side.  Loss of appetite.  Weight loss.  Fatigue.  Swelling in the feet.  Bone pain. How is this diagnosed? This condition is diagnosed based on your medical history, a physical exam, urine tests, lab tests, imaging tests, and your symptoms. You may also have other tests or procedures done, such as:  A narrow tube being inserted into your bladder through your urethra (cystoscopy) in order to view the lining of your bladder for tumors.  A biopsy to sample the tumor to see if cancer is present. If cancer is present, it will then be staged to determine its severity and extent. Staging is an assessment of:  The size of the  tumor.  Whether the cancer has spread.  Where the cancer has spread. It is important to know how deeply into the bladder wall cancer has grown and whether cancer has spread to any other parts of your body. Staging may require blood tests or imaging tests, such as a CT scan, MRI, bone scan, or chest X-ray. How is this treated? Based on the stage of cancer, one treatment or a combination of treatments may be recommended. The most common forms of treatment are:  Surgery to remove the cancer. Procedures that may be done include transurethral resection and cystectomy.  Radiation therapy. This is high-energy X-rays or other particles. This is often used in combination with chemotherapy.  Chemotherapy. During this treatment, medicines are used to kill cancer cells.  Immunotherapy. This uses medicines to help your own immune system destroy cancer cells. Follow these instructions at home:  Take over-the-counter and prescription medicines only as told by your health care provider.  Maintain a healthy diet. Some of your treatments might affect your appetite.  Consider joining a support group. This may help you learn to cope with the stress of having bladder cancer.  Tell your cancer care team if you develop side effects. They may be able to recommend ways to relieve them.  Keep all follow-up visits as told by your health care provider. This is important. Where to find more information  American Cancer Society: www.cancer.org  National Cancer Institute (NCI): www.cancer.gov Contact a health care provider if:  You have symptoms of a urinary tract infection. These include: ?   Fever. ? Chills. ? Weakness. ? Muscle aches. ? Abdominal pain. ? Frequent and intense urge to urinate. ? Burning feeling in the bladder or urethra during urination. Get help right away if:  There is blood in your urine.  You cannot urinate.  You have severe pain or other symptoms that do not go  away. Summary  Bladder cancer is an abnormal growth of tissue in the bladder.  This condition is diagnosed based on your medical history, a physical exam, urine tests, lab tests, imaging tests, and your symptoms.  Based on the stage of cancer, surgery, chemotherapy, or a combination of treatments may be recommended.  Consider joining a support group. This may help you learn to cope with the stress of having bladder cancer. This information is not intended to replace advice given to you by your health care provider. Make sure you discuss any questions you have with your health care provider. Document Revised: 04/22/2017 Document Reviewed: 04/13/2016 Elsevier Patient Education  2020 Elsevier Inc.  

## 2019-10-15 NOTE — Progress Notes (Signed)
   10/15/19  CC: hx of bladder cancer  HPI: [name] is a 69yo here for followup for bladder cancer. NO recent hematuria. No worsening LUTS  His records from [facility] are as follows: Andrew [name] returns today in f/u for a preop visit. He was found on cystoscopy in May to have a 1cm tumor on the dome. He is voiding without complaints but his urine has been dark and he has 2+ blood on the dip today. He has had prior LG tumors and elected to delay TURBT but that is on for next Friday.   02/04/2017: He is s/p TURBT 3 weeks ago and pathology was LG bladder cancer. He has had multiple recurrences over the past 3 years. He has had BCG therapy twice.   04/27/2017: No hematuria or dysuria   10/26/2017: Pt missed 3 months appointment. no hematuria or dysuria   05/03/2018: No worsening LUTS, no hematuria or dysuria   11/29/2018: no hematuria or dysuria    Blood pressure (!) 109/57, pulse 82, temperature 97.9 F (36.6 C), height 6\' 2"  (1.88 m), weight 193 lb (87.5 kg). NED. A&Ox3.   No respiratory distress   Abd soft, NT, ND Normal phallus with bilateral descended testicles  Cystoscopy Procedure Note  Patient identification was confirmed, informed consent was obtained, and patient was prepped using Betadine solution.  Lidocaine jelly was administered per urethral meatus.     Pre-Procedure: - Inspection reveals a normal caliber ureteral meatus.  Procedure: The flexible cystoscope was introduced without difficulty - No urethral strictures/lesions are present. - Enlarged prostate  - Normal bladder neck - Bilateral ureteral orifices identified - 2 dome tumors 1cm and 2cm papillary - No bladder stones - No trabeculation  Retroflexion shows no intravesical prostatic protrusion   Post-Procedure: - Patient tolerated the procedure well  Assessment/ Plan: Bladder cancer: -Schedule for bladder tumor resection. Risks/benefits/alternatives discussed  No follow-ups on file.  Andrew Bang, MD

## 2019-10-15 NOTE — Progress Notes (Signed)

## 2019-10-25 DIAGNOSIS — B351 Tinea unguium: Secondary | ICD-10-CM | POA: Diagnosis not present

## 2019-10-25 DIAGNOSIS — E1142 Type 2 diabetes mellitus with diabetic polyneuropathy: Secondary | ICD-10-CM | POA: Diagnosis not present

## 2019-10-25 DIAGNOSIS — M79676 Pain in unspecified toe(s): Secondary | ICD-10-CM | POA: Diagnosis not present

## 2019-10-25 DIAGNOSIS — L84 Corns and callosities: Secondary | ICD-10-CM | POA: Diagnosis not present

## 2019-11-07 ENCOUNTER — Ambulatory Visit: Payer: Medicare Other | Admitting: Family Medicine

## 2019-11-19 ENCOUNTER — Other Ambulatory Visit: Payer: Self-pay

## 2019-11-19 ENCOUNTER — Encounter: Payer: Self-pay | Admitting: Family Medicine

## 2019-11-19 ENCOUNTER — Ambulatory Visit (INDEPENDENT_AMBULATORY_CARE_PROVIDER_SITE_OTHER): Payer: Medicare Other | Admitting: Family Medicine

## 2019-11-19 VITALS — BP 107/74 | HR 82 | Temp 98.0°F | Ht 72.0 in | Wt 192.0 lb

## 2019-11-19 DIAGNOSIS — I200 Unstable angina: Secondary | ICD-10-CM

## 2019-11-19 DIAGNOSIS — E785 Hyperlipidemia, unspecified: Secondary | ICD-10-CM | POA: Diagnosis not present

## 2019-11-19 DIAGNOSIS — I10 Essential (primary) hypertension: Secondary | ICD-10-CM | POA: Diagnosis not present

## 2019-11-19 DIAGNOSIS — E1159 Type 2 diabetes mellitus with other circulatory complications: Secondary | ICD-10-CM

## 2019-11-19 DIAGNOSIS — E119 Type 2 diabetes mellitus without complications: Secondary | ICD-10-CM | POA: Diagnosis not present

## 2019-11-19 DIAGNOSIS — R5383 Other fatigue: Secondary | ICD-10-CM

## 2019-11-19 DIAGNOSIS — R809 Proteinuria, unspecified: Secondary | ICD-10-CM | POA: Diagnosis not present

## 2019-11-19 LAB — BAYER DCA HB A1C WAIVED: HB A1C (BAYER DCA - WAIVED): 8.1 % — ABNORMAL HIGH (ref <7.0)

## 2019-11-19 MED ORDER — LISINOPRIL 2.5 MG PO TABS: 2.5000 mg | ORAL_TABLET | Freq: Every day | ORAL | 3 refills | Status: DC

## 2019-11-19 MED ORDER — METFORMIN HCL 1000 MG PO TABS: ORAL_TABLET | ORAL | 3 refills | Status: AC

## 2019-11-19 MED ORDER — ROSUVASTATIN CALCIUM 40 MG PO TABS: 40.0000 mg | ORAL_TABLET | Freq: Every day | ORAL | 3 refills | Status: AC

## 2019-11-19 NOTE — Progress Notes (Signed)
BP 107/74   Pulse 82   Temp 98 F (36.7 C)   Ht 6' (1.829 m)   Wt 192 lb (87.1 kg)   BMI 26.04 kg/m    Subjective:   Patient ID: Andrew [name], male    DOB: 1950-07-14, 69 y.o.   MRN: [id number]  HPI: Andrew [name] is a 69 y.o. male presenting on 11/19/2019 for Medical Management of Chronic Issues and Diabetes   HPI Hypertension Patient is currently on bisoprolol 2.5 mg and isosorbide 15 mg and lisinopril 2.5 mg, we have already cut all of these in half and his blood pressure still down, and their blood pressure today is 107/74.  Patient is still having some dizziness, he even had a fall and broke a rib on the right side a week ago.. Patient denies headaches, blurred vision, chest pains, shortness of breath, or weakness. Denies any side effects from medication and is content with current medication.   Hyperlipidemia and CAD Patient is coming in for recheck of his hyperlipidemia. The patient is currently taking Crestor. They deny any issues with myalgias or history of liver damage from it. They deny any focal numbness or weakness or chest pain.   Type 2 diabetes mellitus Patient comes in today for recheck of his diabetes. Patient has been currently taking Jardiance and Metformin. Patient is currently on an ACE inhibitor/ARB. Patient has not seen an ophthalmologist this year. Patient denies any issues with their feet. The symptom started onset as an adult hyperlipidemia and hypertension and CAD ARE RELATED TO DM   Relevant past medical, surgical, family and social history reviewed and updated as indicated. Interim medical history since our last visit reviewed. Allergies and medications reviewed and updated.  Review of Systems  Constitutional: Negative for chills and fever.  Eyes: Negative for visual disturbance.  Respiratory: Negative for shortness of breath and wheezing.   Cardiovascular: Positive for chest pain (Right lower rib pain where he had a fracture a week ago from a fall).  Negative for palpitations and leg swelling.  Musculoskeletal: Negative for back pain and gait problem.  Skin: Negative for rash.  Neurological: Positive for dizziness and light-headedness. Negative for weakness and numbness.  All other systems reviewed and are negative.   Per HPI unless specifically indicated above   Allergies as of 11/19/2019      Reactions   Tramadol Shortness Of Breath   And dizziness   Trulicity [dulaglutide] Swelling   Ankles and feet swell      Medication List       Accurate as of November 19, 2019  2:36 PM. If you have any questions, ask your nurse or doctor.        bisoprolol 5 MG tablet Commonly known as: ZEBETA Take 0.5 tablets (2.5 mg total) by mouth daily. Take at 2pm   clopidogrel 75 MG tablet Commonly known as: Plavix Take 1 tablet (75 mg total) by mouth daily.   empagliflozin 25 MG Tabs tablet Commonly known as: JARDIANCE Take 25 mg by mouth daily.   fenofibrate 48 MG tablet Commonly known as: Tricor Take 1 tablet (48 mg total) by mouth daily.   HYDROcodone-acetaminophen 5-325 MG tablet Commonly known as: NORCO/VICODIN Take 1 tablet by mouth every 8 (eight) hours as needed for moderate pain or severe pain.   isosorbide mononitrate 30 MG 24 hr tablet Commonly known as: IMDUR Take 1 tablet (30 mg total) by mouth daily.   ketorolac 0.5 % ophthalmic solution Commonly known as: ACULAR 1  drop 4 (four) times daily.   lisinopril 2.5 MG tablet Commonly known as: ZESTRIL Take 1 tablet (2.5 mg total) by mouth daily.   metFORMIN 1000 MG tablet Commonly known as: GLUCOPHAGE Take 1 tablet by mouth 2 times daily with a meal.   moxifloxacin 0.5 % ophthalmic solution Commonly known as: VIGAMOX Apply 1 drop to eye 4 (four) times daily.   nitroGLYCERIN 0.4 MG SL tablet Commonly known as: NITROSTAT Place 1 tablet (0.4 mg total) under the tongue every 5 (five) minutes as needed for chest pain.   pantoprazole 40 MG tablet Commonly known as:  PROTONIX Take 1 tablet (40 mg total) by mouth daily.   prednisoLONE acetate 1 % ophthalmic suspension Commonly known as: PRED FORTE Place 1 drop into the left eye 4 (four) times daily.   primidone 50 MG tablet Commonly known as: MYSOLINE Take 1 tablet (50 mg total) by mouth 4 (four) times daily.   rosuvastatin 40 MG tablet Commonly known as: CRESTOR Take 1 tablet (40 mg total) by mouth daily.   tamsulosin 0.4 MG Caps capsule Commonly known as: FLOMAX Take 1 capsule (0.4 mg total) by mouth daily.        Objective:   BP 107/74   Pulse 82   Temp 98 F (36.7 C)   Ht 6' (1.829 m)   Wt 192 lb (87.1 kg)   BMI 26.04 kg/m   Wt Readings from Last 3 Encounters:  11/19/19 192 lb (87.1 kg)  10/15/19 193 lb (87.5 kg)  08/09/19 195 lb 3.2 oz (88.5 kg)    Physical Exam Vitals and nursing note reviewed.  Constitutional:      General: He is not in acute distress.    Appearance: He is well-developed. He is not diaphoretic.  Eyes:     General: No scleral icterus.    Conjunctiva/sclera: Conjunctivae normal.  Neck:     Thyroid: No thyromegaly.  Cardiovascular:     Rate and Rhythm: Normal rate and regular rhythm.     Heart sounds: Normal heart sounds. No murmur heard.   Pulmonary:     Effort: Pulmonary effort is normal. No respiratory distress.     Breath sounds: Normal breath sounds. No wheezing.  Musculoskeletal:        General: Normal range of motion.     Cervical back: Neck supple.  Lymphadenopathy:     Cervical: No cervical adenopathy.  Skin:    General: Skin is warm and dry.     Findings: No rash.  Neurological:     Mental Status: He is alert and oriented to person, place, and time.     Coordination: Coordination normal.  Psychiatric:        Behavior: Behavior normal.       Assessment & Plan:   Problem List Items Addressed This Visit      Cardiovascular and Mediastinum   Type 2 diabetes mellitus with circulatory disorder, without long-term current use of  insulin (HCC) - Primary (Chronic)   Relevant Medications   lisinopril (ZESTRIL) 2.5 MG tablet   metFORMIN (GLUCOPHAGE) 1000 MG tablet   rosuvastatin (CRESTOR) 40 MG tablet   Other Relevant Orders   Bayer DCA Hb A1c Waived   CMP14+EGFR   Lipid panel   Essential hypertension (Chronic)   Relevant Medications   lisinopril (ZESTRIL) 2.5 MG tablet   rosuvastatin (CRESTOR) 40 MG tablet   Other Relevant Orders   CMP14+EGFR     Other   Hyperlipidemia with target LDL less than  70 (Chronic)   Relevant Medications   lisinopril (ZESTRIL) 2.5 MG tablet   rosuvastatin (CRESTOR) 40 MG tablet   Other Relevant Orders   Lipid panel    Other Visit Diagnoses    Microalbuminuria       Relevant Medications   lisinopril (ZESTRIL) 2.5 MG tablet   Type 2 diabetes mellitus without complication, without long-term current use of insulin (HCC)       Relevant Medications   lisinopril (ZESTRIL) 2.5 MG tablet   metFORMIN (GLUCOPHAGE) 1000 MG tablet   rosuvastatin (CRESTOR) 40 MG tablet   Other Relevant Orders   CMP14+EGFR   Fatigue, unspecified type       Relevant Orders   CMP14+EGFR   Lipid panel   Anemia Profile B   Lyme Ab/Western Blot Reflex   Thyroid Panel With TSH   Rocky mtn spotted fvr abs pnl(IgG+IgM)      Continue current medication, have sent a message to his cardiologist to see about discontinuing Imdur and if he improves then we will get rid of the Imdur to see if that helps more with blood pressures. Follow up plan: Return in about 3 months (around 02/19/2020), or if symptoms worsen or fail to improve, for Hypertension and cholesterol diabetes.  Counseling provided for all of the vaccine components Orders Placed This Encounter  Procedures  . Bayer DCA Hb A1c Waived  . CMP14+EGFR  . Lipid panel  . Anemia Profile B  . Lyme Ab/Western Blot Reflex  . Thyroid Panel With TSH  . Rocky mtn spotted fvr abs pnl(IgG+IgM)    Caryl Pina, MD [facility] 11/19/2019, 2:36 PM

## 2019-11-20 ENCOUNTER — Other Ambulatory Visit: Payer: Self-pay | Admitting: Family Medicine

## 2019-11-20 ENCOUNTER — Telehealth: Payer: Self-pay | Admitting: Family Medicine

## 2019-11-20 DIAGNOSIS — G250 Essential tremor: Secondary | ICD-10-CM

## 2019-11-20 DIAGNOSIS — I10 Essential (primary) hypertension: Secondary | ICD-10-CM

## 2019-11-20 NOTE — Telephone Encounter (Signed)
Visit and lab work done yesterday.  Requesting refills.

## 2019-11-20 NOTE — Telephone Encounter (Signed)
  Prescription Request  11/20/2019  What is the name of the medication or equipment? primidone (MYSOLINE) 50 MG tablet  bisoprolol (ZEBETA) 5 MG tablet    Have you contacted your pharmacy to request a refill? (if applicable) yes  Which pharmacy would you like this sent to? [facility] would like a 3 month supply   Patient notified that their request is being sent to the clinical staff for review and that they should receive a response within 2 business days.

## 2019-11-21 LAB — LYME AB/WESTERN BLOT REFLEX
LYME DISEASE AB, QUANT, IGM: <0.8 index (ref 0.00–0.79)
Lyme IgG/IgM Ab: <0.91 {ISR} (ref 0.00–0.90)

## 2019-11-21 LAB — CMP14+EGFR
ALT: 10 IU/L (ref 0–44)
AST: 9 IU/L (ref 0–40)
Albumin/Globulin Ratio: 1.7 (ref 1.2–2.2)
Albumin: 4.7 g/dL (ref 3.8–4.8)
Alkaline Phosphatase: 66 IU/L (ref 48–121)
BUN/Creatinine Ratio: 13 (ref 10–24)
BUN: 12 mg/dL (ref 8–27)
Bilirubin Total: 0.2 mg/dL (ref 0.0–1.2)
CO2: 22 mmol/L (ref 20–29)
Calcium: 9.9 mg/dL (ref 8.6–10.2)
Chloride: 100 mmol/L (ref 96–106)
Creatinine, Ser: 0.92 mg/dL (ref 0.76–1.27)
GFR calc Af Amer: 98 mL/min/{1.73_m2} (ref >59)
GFR calc non Af Amer: 85 mL/min/{1.73_m2} (ref >59)
Globulin, Total: 2.8 g/dL (ref 1.5–4.5)
Glucose: 132 mg/dL — ABNORMAL HIGH (ref 65–99)
Potassium: 5 mmol/L (ref 3.5–5.2)
Sodium: 136 mmol/L (ref 134–144)
Total Protein: 7.5 g/dL (ref 6.0–8.5)

## 2019-11-21 LAB — LIPID PANEL
Chol/HDL Ratio: 2.6 ratio (ref 0.0–5.0)
Cholesterol, Total: 110 mg/dL (ref 100–199)
HDL: 42 mg/dL (ref >39)
LDL Chol Calc (NIH): 46 mg/dL (ref 0–99)
Triglycerides: 126 mg/dL (ref 0–149)
VLDL Cholesterol Cal: 22 mg/dL (ref 5–40)

## 2019-11-21 LAB — ANEMIA PROFILE B
Basophils Absolute: 0.1 10*3/uL (ref 0.0–0.2)
Basos: 1 %
EOS (ABSOLUTE): 0.3 10*3/uL (ref 0.0–0.4)
Eos: 4 %
Ferritin: 86 ng/mL (ref 30–400)
Folate: 8.7 ng/mL (ref >3.0)
Hematocrit: 52.2 % — ABNORMAL HIGH (ref 37.5–51.0)
Hemoglobin: 17.1 g/dL (ref 13.0–17.7)
Immature Grans (Abs): 0.1 10*3/uL (ref 0.0–0.1)
Immature Granulocytes: 1 %
Iron Saturation: 18 % (ref 15–55)
Iron: 61 ug/dL (ref 38–169)
Lymphocytes Absolute: 2.2 10*3/uL (ref 0.7–3.1)
Lymphs: 23 %
MCH: 31.2 pg (ref 26.6–33.0)
MCHC: 32.8 g/dL (ref 31.5–35.7)
MCV: 95 fL (ref 79–97)
Monocytes Absolute: 0.8 10*3/uL (ref 0.1–0.9)
Monocytes: 8 %
Neutrophils Absolute: 6.1 10*3/uL (ref 1.4–7.0)
Neutrophils: 63 %
Platelets: 155 10*3/uL (ref 150–450)
RBC: 5.48 x10E6/uL (ref 4.14–5.80)
RDW: 12.8 % (ref 11.6–15.4)
Retic Ct Pct: 1.3 % (ref 0.6–2.6)
Total Iron Binding Capacity: 333 ug/dL (ref 250–450)
UIBC: 272 ug/dL (ref 111–343)
Vitamin B-12: 215 pg/mL — ABNORMAL LOW (ref 232–1245)
WBC: 9.5 10*3/uL (ref 3.4–10.8)

## 2019-11-21 LAB — THYROID PANEL WITH TSH
Free Thyroxine Index: 1.8 (ref 1.2–4.9)
T3 Uptake Ratio: 25 % (ref 24–39)
T4, Total: 7.1 ug/dL (ref 4.5–12.0)
TSH: 4.27 u[IU]/mL (ref 0.450–4.500)

## 2019-11-21 LAB — ROCKY MTN SPOTTED FVR ABS PNL(IGG+IGM)
RMSF IgG: NEGATIVE
RMSF IgM: 0.53 index (ref 0.00–0.89)

## 2019-11-21 MED ORDER — BISOPROLOL FUMARATE 5 MG PO TABS: 2.5000 mg | ORAL_TABLET | Freq: Every day | ORAL | 3 refills | Status: DC

## 2019-11-21 MED ORDER — PRIMIDONE 50 MG PO TABS: 50.0000 mg | ORAL_TABLET | Freq: Four times a day (QID) | ORAL | 3 refills | Status: DC

## 2019-11-21 NOTE — Telephone Encounter (Signed)
Send refills for the patient's of both medicines

## 2019-11-22 MED ORDER — PRIMIDONE 50 MG PO TABS: 50.0000 mg | ORAL_TABLET | Freq: Four times a day (QID) | ORAL | 3 refills | Status: AC

## 2019-11-22 MED ORDER — BISOPROLOL FUMARATE 5 MG PO TABS: 2.5000 mg | ORAL_TABLET | Freq: Every day | ORAL | 3 refills | Status: DC

## 2019-11-22 NOTE — Telephone Encounter (Signed)
Refill sent to pharmacy.   

## 2019-11-23 ENCOUNTER — Telehealth: Payer: Self-pay | Admitting: Family Medicine

## 2019-11-23 NOTE — Telephone Encounter (Signed)
-----   Message from Leonie Man, MD sent at 11/23/2019 12:56 PM EDT ----- Regarding: RE: hypotension I would say probably stop his ACE inhibitor and just continue low-dose beta-blocker.  If he is not having angina, okay to stop Imdur.  Glenetta Hew, MD ----- Message ----- From: Vickey Boak, Fransisca Kaufmann, MD Sent: 11/19/2019   2:00 PM EDT To: Leonie Man, MD Subject: hypotension                                    Patient is having hypotension and falls and I have already reduced BP meds, I am thinking now to take off IMDUR but wanted to make sure you were ok with this because of his heart history. BP today is 107/74 Caryl Pina, MD [facility] 11/19/2019, 2:04 PM

## 2019-11-23 NOTE — Telephone Encounter (Signed)
Got a message back from cardiology and he said to stop his lisinopril and we will see how his blood pressure does and then if it does not do well then we will stop the Imdur, check it over the next week after stopping the lisinopril. Caryl Pina, MD [facility] 11/23/2019, 5:11 PM

## 2019-11-27 NOTE — Telephone Encounter (Signed)
Pt aware of provider feedback and voiced understanding. 

## 2019-12-05 ENCOUNTER — Telehealth: Payer: Self-pay | Admitting: Family Medicine

## 2019-12-05 NOTE — Telephone Encounter (Signed)
Pt called stating that he was advised to take OTC vitamin B12 after his lab results were reviewed and wants to know what dosage he is supposed to take each day.

## 2019-12-06 NOTE — Telephone Encounter (Signed)
The typical starting dose is 500 mcg of vitamin B12 daily

## 2019-12-06 NOTE — Telephone Encounter (Signed)
Those are lower than I would like but the lisinopril does take a week to get out of his system, if it is still running low, early next week then we will send another message to cardiology and see about stopping one of his other medicines.

## 2019-12-06 NOTE — Telephone Encounter (Signed)
Advised pt of provider feedback and pt voiced understanding.

## 2019-12-06 NOTE — Telephone Encounter (Signed)
Pt's daughter aware of dosage for B12 and then she also had some concerns about his BP readings which were 104/56, 98/53. His last dose of the lisinopril was Tuesday morning. Are these readings ok?

## 2019-12-10 ENCOUNTER — Telehealth: Payer: Self-pay | Admitting: Family Medicine

## 2019-12-10 ENCOUNTER — Telehealth: Payer: Self-pay | Admitting: Cardiology

## 2019-12-10 NOTE — Telephone Encounter (Signed)
Pt c/o swelling: STAT is pt has developed SOB within 24 hours  1) How much weight have you gained and in what time span? No   2) If swelling, where is the swelling located? Feet & Ankles (purple colored almost rash like looking)  3) Are you currently taking a fluid pill? Not sure   4) Are you currently SOB? A little   5) Do you have a log of your daily weights (if so, list)? No   6) Have you gained 3 pounds in a day or 5 pounds in a week? No has lost 3 lbs since last visit   7) Have you traveled recently? No   Isahia is also having numbness in his feet to the point that he has to slam it on the ground sideways to get feeling back. Luellen Pucker the pt's daughter is requesting an appt due to his PCP not being able to see him as soon as they feel necessary. Please advise.

## 2019-12-10 NOTE — Telephone Encounter (Signed)
error 

## 2019-12-10 NOTE — Telephone Encounter (Signed)
Spoke with pt daughter, she has been aware of the swelling for the last 2-3 days. This morning the swelling is over his ankles. He stopped the lisinopril last week and his bp is running above 637 systolic. His legs are purplish and look like there is a rash but the patient reports that is normal. He reports and increase in the numbness of his feet. He has SOB when walking out to the horses but none in the home and denies orthopnea. He props up his legs in the evenings, he does not have support socks. Follow up scheduled next available with dr harding. They will continue to monitor and watch his sodium and fluid intake. They will call back with concerns prior to appointment. Pt daughter agreed with this plan.

## 2019-12-18 ENCOUNTER — Other Ambulatory Visit: Payer: Self-pay | Admitting: *Deleted

## 2019-12-18 DIAGNOSIS — I10 Essential (primary) hypertension: Secondary | ICD-10-CM | POA: Diagnosis not present

## 2019-12-18 DIAGNOSIS — I252 Old myocardial infarction: Secondary | ICD-10-CM | POA: Diagnosis not present

## 2019-12-18 DIAGNOSIS — R0989 Other specified symptoms and signs involving the circulatory and respiratory systems: Secondary | ICD-10-CM | POA: Diagnosis not present

## 2019-12-18 DIAGNOSIS — M25472 Effusion, left ankle: Secondary | ICD-10-CM | POA: Diagnosis not present

## 2019-12-18 DIAGNOSIS — I25119 Atherosclerotic heart disease of native coronary artery with unspecified angina pectoris: Secondary | ICD-10-CM | POA: Diagnosis not present

## 2019-12-18 DIAGNOSIS — E1159 Type 2 diabetes mellitus with other circulatory complications: Secondary | ICD-10-CM | POA: Diagnosis not present

## 2019-12-18 DIAGNOSIS — K219 Gastro-esophageal reflux disease without esophagitis: Secondary | ICD-10-CM

## 2019-12-18 MED ORDER — PANTOPRAZOLE SODIUM 40 MG PO TBEC: 40.0000 mg | DELAYED_RELEASE_TABLET | Freq: Every day | ORAL | 1 refills | Status: DC

## 2019-12-18 NOTE — Telephone Encounter (Signed)
NOV 02/21/20

## 2020-01-07 ENCOUNTER — Ambulatory Visit: Payer: Medicare Other | Admitting: Cardiology

## 2020-01-10 DIAGNOSIS — B351 Tinea unguium: Secondary | ICD-10-CM | POA: Diagnosis not present

## 2020-01-10 DIAGNOSIS — L84 Corns and callosities: Secondary | ICD-10-CM | POA: Diagnosis not present

## 2020-01-10 DIAGNOSIS — M79676 Pain in unspecified toe(s): Secondary | ICD-10-CM | POA: Diagnosis not present

## 2020-01-10 DIAGNOSIS — E1142 Type 2 diabetes mellitus with diabetic polyneuropathy: Secondary | ICD-10-CM | POA: Diagnosis not present

## 2020-01-16 NOTE — Patient Instructions (Signed)
Andrew [name]  01/16/2020     @PREFPERIOPPHARMACY @   Your procedure is scheduled on  01/21/2020.  Report to [facility] at  1145  A.M.  Call this number if you have problems the morning of surgery:  [phone]   Remember:  Do not eat or drink after midnight.                      Take these medicines the morning of surgery with A SIP OF WATER  Bisoprolol, hydrocodone(if needed), flomax. DO NOT take any medications for diabetes the morning of your procedure. Follow instructions given to you from Dr Alyson Ingles about stopping your plavix.    Do not wear jewelry, make-up or nail polish.  Do not wear lotions, powders, or perfumes. Please wear deodorant and brush your teeth.  Do not shave 48 hours prior to surgery.  Men may shave face and neck.  Do not bring valuables to the hospital.  [facility] is not responsible for any belongings or valuables.  Contacts, dentures or bridgework may not be worn into surgery.  Leave your suitcase in the car.  After surgery it may be brought to your room.  For patients admitted to the hospital, discharge time will be determined by your treatment team.  Patients discharged the day of surgery will not be allowed to drive home.   Name and phone number of your driver:   family Special instructions:  DO NOT smoke the morning of your procedure.  Please read over the following fact sheets that you were given. Anesthesia Post-op Instructions and Care and Recovery After Surgery       Transurethral Resection of Bladder Tumor, Care After This sheet gives you information about how to care for yourself after your procedure. Your health care provider may also give you more specific instructions. If you have problems or questions, contact your health care provider. What can I expect after the procedure? After the procedure, it is common to have:  A small amount of blood in your urine for up to 2 weeks.  Soreness or mild pain from your catheter. After  your catheter is removed, you may have mild soreness, especially when urinating.  Pain in your lower abdomen. Follow these instructions at home: Medicines   Take over-the-counter and prescription medicines only as told by your health care provider.  If you were prescribed an antibiotic medicine, take it as told by your health care provider. Do not stop taking the antibiotic even if you start to feel better.  Do not drive for 24 hours if you were given a sedative during your procedure.  Ask your health care provider if the medicine prescribed to you: ? Requires you to avoid driving or using heavy machinery. ? Can cause constipation. You may need to take these actions to prevent or treat constipation:  Take over-the-counter or prescription medicines.  Eat foods that are high in fiber, such as beans, whole grains, and fresh fruits and vegetables.  Limit foods that are high in fat and processed sugars, such as fried or sweet foods. Activity  Return to your normal activities as told by your health care provider. Ask your health care provider what activities are safe for you.  Do not lift anything that is heavier than 10 lb (4.5 kg), or the limit that you are told, until your health care provider says that it is safe.  Avoid intense physical activity for as long as  told by your health care provider.  Rest as told by your health care provider.  Avoid sitting for a long time without moving. Get up to take short walks every 1-2 hours. This is important to improve blood flow and breathing. Ask for help if you feel weak or unsteady. General instructions   Do not drink alcohol for as long as told by your health care provider. This is especially important if you are taking prescription pain medicines.  Do not take baths, swim, or use a hot tub until your health care provider approves. Ask your health care provider if you may take showers. You may only be allowed to take sponge baths.  If  you have a catheter, follow instructions from your health care provider about caring for your catheter and your drainage bag.  Drink enough fluid to keep your urine pale yellow.  Wear compression stockings as told by your health care provider. These stockings help to prevent blood clots and reduce swelling in your legs.  Keep all follow-up visits as told by your health care provider. This is important. ? You will need to be followed closely with regular checks of your bladder and urethra (cystoscopies) to make sure that the cancer does not come back. Contact a health care provider if:  You have pain that gets worse or does not improve with medicine.  You have blood in your urine for more than 2 weeks.  You have cloudy or bad-smelling urine.  You become constipated. Signs of constipation may include having: ? Fewer than three bowel movements in a week. ? Difficulty having a bowel movement. ? Stools that are dry, hard, or larger than normal.  You have a fever. Get help right away if:  You have: ? Severe pain. ? Bright red blood in your urine. ? Blood clots in your urine. ? A lot of blood in your urine.  Your catheter has been removed and you are not able to urinate.  You have a catheter in place and the catheter is not draining urine. Summary  After your procedure, it is common to have a small amount of blood in your urine, soreness or mild pain from your catheter, and pain in your lower abdomen.  Take over-the-counter and prescription medicines only as told by your health care provider.  Rest as told by your health care provider. Follow your health care provider's instructions about returning to normal activities. Ask what activities are safe for you.  If you have a catheter, follow instructions from your health care provider about caring for your catheter and your drainage bag.  Get help right away if you cannot urinate, you have severe pain, or you have bright red blood or  blood clots in your urine. This information is not intended to replace advice given to you by your health care provider. Make sure you discuss any questions you have with your health care provider. Document Revised: 12/08/2017 Document Reviewed: 12/08/2017 Elsevier Patient Education  [place] After These instructions provide you with information about caring for yourself after your procedure. Your health care provider may also give you more specific instructions. Your treatment has been planned according to current medical practices, but problems sometimes occur. Call your health care provider if you have any problems or questions after your procedure. What can I expect after the procedure? After your procedure, you may:  Feel sleepy for several hours.  Feel clumsy and have poor balance for several hours.  Feel forgetful about what happened after the procedure.  Have poor judgment for several hours.  Feel nauseous or vomit.  Have a sore throat if you had a breathing tube during the procedure. Follow these instructions at home: For at least 24 hours after the procedure:      Have a responsible adult stay with you. It is important to have someone help care for you until you are awake and alert.  Rest as needed.  Do not: ? Participate in activities in which you could fall or become injured. ? Drive. ? Use heavy machinery. ? Drink alcohol. ? Take sleeping pills or medicines that cause drowsiness. ? Make important decisions or sign legal documents. ? Take care of children on your own. Eating and drinking  Follow the diet that is recommended by your health care provider.  If you vomit, drink water, juice, or soup when you can drink without vomiting.  Make sure you have little or no nausea before eating solid foods. General instructions  Take over-the-counter and prescription medicines only as told by your health care provider.  If  you have sleep apnea, surgery and certain medicines can increase your risk for breathing problems. Follow instructions from your health care provider about wearing your sleep device: ? Anytime you are sleeping, including during daytime naps. ? While taking prescription pain medicines, sleeping medicines, or medicines that make you drowsy.  If you smoke, do not smoke without supervision.  Keep all follow-up visits as told by your health care provider. This is important. Contact a health care provider if:  You keep feeling nauseous or you keep vomiting.  You feel light-headed.  You develop a rash.  You have a fever. Get help right away if:  You have trouble breathing. Summary  For several hours after your procedure, you may feel sleepy and have poor judgment.  Have a responsible adult stay with you for at least 24 hours or until you are awake and alert. This information is not intended to replace advice given to you by your health care provider. Make sure you discuss any questions you have with your health care provider. Document Revised: 08/08/2017 Document Reviewed: 08/31/2015 Elsevier Patient Education  [place].

## 2020-01-18 ENCOUNTER — Telehealth: Payer: Self-pay | Admitting: Urology

## 2020-01-18 ENCOUNTER — Encounter (HOSPITAL_COMMUNITY): Payer: Self-pay

## 2020-01-18 ENCOUNTER — Encounter (HOSPITAL_COMMUNITY)
Admission: RE | Admit: 2020-01-18 | Discharge: 2020-01-18 | Disposition: A | Discharge status: Home / Self Care | Payer: Medicare Other | Source: Ambulatory Visit | Attending: Urology | Admitting: Urology

## 2020-01-18 ENCOUNTER — Other Ambulatory Visit: Payer: Self-pay

## 2020-01-18 ENCOUNTER — Other Ambulatory Visit (HOSPITAL_COMMUNITY)
Admission: RE | Admit: 2020-01-18 | Discharge: 2020-01-18 | Disposition: A | Discharge status: Home / Self Care | Payer: Medicare Other | Source: Ambulatory Visit | Attending: Urology | Admitting: Urology

## 2020-01-18 DIAGNOSIS — Z20822 Contact with and (suspected) exposure to covid-19: Secondary | ICD-10-CM | POA: Diagnosis not present

## 2020-01-18 DIAGNOSIS — Z01812 Encounter for preprocedural laboratory examination: Secondary | ICD-10-CM | POA: Diagnosis not present

## 2020-01-18 LAB — CBC WITH DIFFERENTIAL/PLATELET
Abs Immature Granulocytes: 0.05 10*3/uL (ref 0.00–0.07)
Basophils Absolute: 0.1 10*3/uL (ref 0.0–0.1)
Basophils Relative: 1 %
Eosinophils Absolute: 0.4 10*3/uL (ref 0.0–0.5)
Eosinophils Relative: 5 %
HCT: 47.2 % (ref 39.0–52.0)
Hemoglobin: 15.6 g/dL (ref 13.0–17.0)
Immature Granulocytes: 1 %
Lymphocytes Relative: 17 %
Lymphs Abs: 1.4 10*3/uL (ref 0.7–4.0)
MCH: 31.3 pg (ref 26.0–34.0)
MCHC: 33.1 g/dL (ref 30.0–36.0)
MCV: 94.8 fL (ref 80.0–100.0)
Monocytes Absolute: 0.7 10*3/uL (ref 0.1–1.0)
Monocytes Relative: 8 %
Neutro Abs: 5.5 10*3/uL (ref 1.7–7.7)
Neutrophils Relative %: 68 %
Platelets: 221 10*3/uL (ref 150–400)
RBC: 4.98 MIL/uL (ref 4.22–5.81)
RDW: 12.9 % (ref 11.5–15.5)
WBC: 8.1 10*3/uL (ref 4.0–10.5)
nRBC: 0 % (ref 0.0–0.2)

## 2020-01-18 LAB — BASIC METABOLIC PANEL
Anion gap: 11 (ref 5–15)
BUN: 21 mg/dL (ref 8–23)
CO2: 20 mmol/L — ABNORMAL LOW (ref 22–32)
Calcium: 9.2 mg/dL (ref 8.9–10.3)
Chloride: 103 mmol/L (ref 98–111)
Creatinine, Ser: 0.89 mg/dL (ref 0.61–1.24)
GFR calc Af Amer: >60 mL/min (ref >60)
GFR calc non Af Amer: >60 mL/min (ref >60)
Glucose, Bld: 275 mg/dL — ABNORMAL HIGH (ref 70–99)
Potassium: 4 mmol/L (ref 3.5–5.1)
Sodium: 134 mmol/L — ABNORMAL LOW (ref 135–145)

## 2020-01-18 LAB — SARS CORONAVIRUS 2 (TAT 6-24 HRS): SARS Coronavirus 2: NEGATIVE

## 2020-01-18 NOTE — Telephone Encounter (Signed)
Per Dr. Alyson Ingles pt will not get any chemo agent this surgery since it is a repeat. Barbara in Day surgery made aware.

## 2020-01-18 NOTE — Telephone Encounter (Signed)
Pamala Hurry called from day surgery at [facility]. She called about his surgery for Monday. She has a question about a chemo agent.

## 2020-01-21 ENCOUNTER — Ambulatory Visit (HOSPITAL_COMMUNITY)
Admission: RE | Admit: 2020-01-21 | Discharge: 2020-01-21 | Disposition: A | Discharge status: Home / Self Care | Payer: Medicare Other | Attending: Urology | Admitting: Urology

## 2020-01-21 ENCOUNTER — Ambulatory Visit (HOSPITAL_COMMUNITY): Payer: Medicare Other | Admitting: Anesthesiology

## 2020-01-21 ENCOUNTER — Encounter (HOSPITAL_COMMUNITY)
Admission: RE | Disposition: A | Discharge status: Home / Self Care | Payer: Self-pay | Source: Home / Self Care | Attending: Urology

## 2020-01-21 ENCOUNTER — Encounter (HOSPITAL_COMMUNITY): Payer: Self-pay | Admitting: Urology

## 2020-01-21 DIAGNOSIS — Z8249 Family history of ischemic heart disease and other diseases of the circulatory system: Secondary | ICD-10-CM | POA: Insufficient documentation

## 2020-01-21 DIAGNOSIS — I10 Essential (primary) hypertension: Secondary | ICD-10-CM | POA: Diagnosis not present

## 2020-01-21 DIAGNOSIS — D494 Neoplasm of unspecified behavior of bladder: Secondary | ICD-10-CM | POA: Diagnosis not present

## 2020-01-21 DIAGNOSIS — M1990 Unspecified osteoarthritis, unspecified site: Secondary | ICD-10-CM | POA: Insufficient documentation

## 2020-01-21 DIAGNOSIS — E119 Type 2 diabetes mellitus without complications: Secondary | ICD-10-CM | POA: Insufficient documentation

## 2020-01-21 DIAGNOSIS — N35911 Unspecified urethral stricture, male, meatal: Secondary | ICD-10-CM | POA: Insufficient documentation

## 2020-01-21 DIAGNOSIS — G4730 Sleep apnea, unspecified: Secondary | ICD-10-CM | POA: Insufficient documentation

## 2020-01-21 DIAGNOSIS — F17200 Nicotine dependence, unspecified, uncomplicated: Secondary | ICD-10-CM | POA: Diagnosis not present

## 2020-01-21 DIAGNOSIS — I2510 Atherosclerotic heart disease of native coronary artery without angina pectoris: Secondary | ICD-10-CM | POA: Insufficient documentation

## 2020-01-21 DIAGNOSIS — Z8673 Personal history of transient ischemic attack (TIA), and cerebral infarction without residual deficits: Secondary | ICD-10-CM | POA: Diagnosis not present

## 2020-01-21 DIAGNOSIS — Z955 Presence of coronary angioplasty implant and graft: Secondary | ICD-10-CM | POA: Insufficient documentation

## 2020-01-21 DIAGNOSIS — D090 Carcinoma in situ of bladder: Secondary | ICD-10-CM | POA: Diagnosis not present

## 2020-01-21 DIAGNOSIS — C671 Malignant neoplasm of dome of bladder: Secondary | ICD-10-CM | POA: Insufficient documentation

## 2020-01-21 DIAGNOSIS — Z885 Allergy status to narcotic agent status: Secondary | ICD-10-CM | POA: Insufficient documentation

## 2020-01-21 DIAGNOSIS — Z888 Allergy status to other drugs, medicaments and biological substances status: Secondary | ICD-10-CM | POA: Diagnosis not present

## 2020-01-21 DIAGNOSIS — Z833 Family history of diabetes mellitus: Secondary | ICD-10-CM | POA: Diagnosis not present

## 2020-01-21 DIAGNOSIS — I252 Old myocardial infarction: Secondary | ICD-10-CM | POA: Diagnosis not present

## 2020-01-21 DIAGNOSIS — J449 Chronic obstructive pulmonary disease, unspecified: Secondary | ICD-10-CM | POA: Diagnosis not present

## 2020-01-21 DIAGNOSIS — F17210 Nicotine dependence, cigarettes, uncomplicated: Secondary | ICD-10-CM | POA: Insufficient documentation

## 2020-01-21 DIAGNOSIS — C679 Malignant neoplasm of bladder, unspecified: Secondary | ICD-10-CM

## 2020-01-21 HISTORY — PX: CYSTOSCOPY: SHX5120

## 2020-01-21 HISTORY — PX: TRANSURETHRAL RESECTION OF BLADDER TUMOR: SHX2575

## 2020-01-21 LAB — GLUCOSE, CAPILLARY
Glucose-Capillary: 190 mg/dL — ABNORMAL HIGH (ref 70–99)
Glucose-Capillary: 181 mg/dL — ABNORMAL HIGH (ref 70–99)

## 2020-01-21 SURGERY — CYSTOSCOPY
Anesthesia: General

## 2020-01-21 MED ORDER — LIDOCAINE HCL (CARDIAC) PF 100 MG/5ML IV SOSY
PREFILLED_SYRINGE | INTRAVENOUS | Status: DC | PRN
  Administered 2020-01-21: 100 mg via INTRAVENOUS

## 2020-01-21 MED ORDER — PHENYLEPHRINE HCL (PRESSORS) 10 MG/ML IV SOLN
INTRAVENOUS | Status: DC | PRN
  Administered 2020-01-21: 120 ug via INTRAVENOUS

## 2020-01-21 MED ORDER — PROMETHAZINE HCL 25 MG/ML IJ SOLN: 6.2500 mg | INTRAMUSCULAR | Status: DC | PRN

## 2020-01-21 MED ORDER — FENTANYL CITRATE (PF) 250 MCG/5ML IJ SOLN
INTRAMUSCULAR | Status: AC
  Filled 2020-01-21: qty 5

## 2020-01-21 MED ORDER — CEFAZOLIN SODIUM-DEXTROSE 2-4 GM/100ML-% IV SOLN
2.0000 g | INTRAVENOUS | Status: AC
  Administered 2020-01-21: 2 g via INTRAVENOUS
  Filled 2020-01-21 (×2): qty 100

## 2020-01-21 MED ORDER — ETOMIDATE 2 MG/ML IV SOLN
INTRAVENOUS | Status: DC | PRN
  Administered 2020-01-21: 10 mg via INTRAVENOUS

## 2020-01-21 MED ORDER — ONDANSETRON HCL 4 MG/2ML IJ SOLN
INTRAMUSCULAR | Status: DC | PRN
  Administered 2020-01-21: 4 mg via INTRAVENOUS

## 2020-01-21 MED ORDER — PROPOFOL 10 MG/ML IV BOLUS
INTRAVENOUS | Status: AC
  Filled 2020-01-21: qty 40

## 2020-01-21 MED ORDER — PHENYLEPHRINE 40 MCG/ML (10ML) SYRINGE FOR IV PUSH (FOR BLOOD PRESSURE SUPPORT)
PREFILLED_SYRINGE | INTRAVENOUS | Status: AC
  Filled 2020-01-21: qty 10

## 2020-01-21 MED ORDER — LACTATED RINGERS IV SOLN
Freq: Once | INTRAVENOUS | Status: AC
  Administered 2020-01-21: 1000 mL via INTRAVENOUS

## 2020-01-21 MED ORDER — METOPROLOL TARTRATE 5 MG/5ML IV SOLN
INTRAVENOUS | Status: DC | PRN
  Administered 2020-01-21: 1 mg via INTRAVENOUS
  Administered 2020-01-21: 2 mg via INTRAVENOUS

## 2020-01-21 MED ORDER — MEPERIDINE HCL 50 MG/ML IJ SOLN: 6.2500 mg | INTRAMUSCULAR | Status: DC | PRN

## 2020-01-21 MED ORDER — PHENYLEPHRINE 40 MCG/ML (10ML) SYRINGE FOR IV PUSH (FOR BLOOD PRESSURE SUPPORT)
PREFILLED_SYRINGE | INTRAVENOUS | Status: AC
  Filled 2020-01-21: qty 20

## 2020-01-21 MED ORDER — METOPROLOL TARTRATE 5 MG/5ML IV SOLN
INTRAVENOUS | Status: AC
  Filled 2020-01-21: qty 5

## 2020-01-21 MED ORDER — LIDOCAINE 2% (20 MG/ML) 5 ML SYRINGE
INTRAMUSCULAR | Status: AC
  Filled 2020-01-21: qty 10

## 2020-01-21 MED ORDER — FENTANYL CITRATE (PF) 100 MCG/2ML IJ SOLN
INTRAMUSCULAR | Status: DC | PRN
  Administered 2020-01-21 (×4): 25 ug via INTRAVENOUS

## 2020-01-21 MED ORDER — STERILE WATER FOR IRRIGATION IR SOLN
Status: DC | PRN
  Administered 2020-01-21: 500 mL

## 2020-01-21 MED ORDER — FENTANYL CITRATE (PF) 100 MCG/2ML IJ SOLN
25.0000 ug | INTRAMUSCULAR | Status: DC | PRN
  Administered 2020-01-21: 50 ug via INTRAVENOUS
  Filled 2020-01-21: qty 2

## 2020-01-21 MED ORDER — ONDANSETRON HCL 4 MG/2ML IJ SOLN
INTRAMUSCULAR | Status: AC
  Filled 2020-01-21: qty 4

## 2020-01-21 MED ORDER — LACTATED RINGERS IV SOLN: INTRAVENOUS | Status: DC | PRN

## 2020-01-21 MED ORDER — PROPOFOL 10 MG/ML IV BOLUS
INTRAVENOUS | Status: DC | PRN
  Administered 2020-01-21: 50 mg via INTRAVENOUS
  Administered 2020-01-21: 40 mg via INTRAVENOUS

## 2020-01-21 MED ORDER — CHLORHEXIDINE GLUCONATE 0.12 % MT SOLN
15.0000 mL | Freq: Once | OROMUCOSAL | Status: AC
  Administered 2020-01-21: 15 mL via OROMUCOSAL

## 2020-01-21 MED ORDER — KETAMINE HCL 10 MG/ML IJ SOLN
INTRAMUSCULAR | Status: AC
  Filled 2020-01-21: qty 1

## 2020-01-21 MED ORDER — HYDROCODONE-ACETAMINOPHEN 5-325 MG PO TABS
1.0000 | ORAL_TABLET | Freq: Four times a day (QID) | ORAL | 0 refills | Status: DC | PRN
Start: 2020-01-21 — End: 2021-02-03

## 2020-01-21 MED ORDER — ORAL CARE MOUTH RINSE: 15.0000 mL | Freq: Once | OROMUCOSAL | Status: AC

## 2020-01-21 MED ORDER — SODIUM CHLORIDE 0.9 % IR SOLN
Status: DC | PRN
  Administered 2020-01-21: 3000 mL

## 2020-01-21 MED ORDER — ETOMIDATE 2 MG/ML IV SOLN
INTRAVENOUS | Status: AC
  Filled 2020-01-21: qty 10

## 2020-01-21 SURGICAL SUPPLY — 34 items
BAG DRAIN URO TABLE W/ADPT NS (BAG) ×3 IMPLANT
BAG DRN 8 ADPR NS SKTRN CSTL (BAG) ×1
BAG DRN RND TRDRP ANRFLXCHMBR (UROLOGICAL SUPPLIES) ×1
BAG HAMPER (MISCELLANEOUS) ×3 IMPLANT
BAG URINE DRAIN 2000ML AR STRL (UROLOGICAL SUPPLIES) ×3 IMPLANT
CATH FOLEY 2WAY SLVR  5CC 22FR (CATHETERS) ×2
CATH FOLEY 2WAY SLVR 5CC 22FR (CATHETERS) ×1 IMPLANT
CATH FOLEY 3WAY 30CC 22F (CATHETERS) IMPLANT
CATH FOLEY LATEX FREE 22FR (CATHETERS)
CATH FOLEY LF 22FR (CATHETERS) IMPLANT
CLOTH BEACON ORANGE TIMEOUT ST (SAFETY) ×3 IMPLANT
ELECT LOOP 22F BIPOLAR SML (ELECTROSURGICAL) ×3
ELECT REM PT RETURN 9FT ADLT (ELECTROSURGICAL) ×3
ELECTRODE LOOP 22F BIPOLAR SML (ELECTROSURGICAL) ×1 IMPLANT
ELECTRODE REM PT RTRN 9FT ADLT (ELECTROSURGICAL) ×1 IMPLANT
GLOVE BIO SURGEON STRL SZ7.5 (GLOVE) ×3 IMPLANT
GLOVE BIO SURGEON STRL SZ8 (GLOVE) ×3 IMPLANT
GLOVE BIOGEL PI IND STRL 7.0 (GLOVE) ×4 IMPLANT
GLOVE BIOGEL PI IND STRL 7.5 (GLOVE) ×1 IMPLANT
GLOVE BIOGEL PI INDICATOR 7.0 (GLOVE) ×8
GLOVE BIOGEL PI INDICATOR 7.5 (GLOVE) ×2
GLOVE ECLIPSE 6.5 STRL STRAW (GLOVE) ×3 IMPLANT
GOWN STRL REUS W/ TWL XL LVL3 (GOWN DISPOSABLE) ×2 IMPLANT
GOWN STRL REUS W/TWL LRG LVL3 (GOWN DISPOSABLE) ×3 IMPLANT
GOWN STRL REUS W/TWL XL LVL3 (GOWN DISPOSABLE) ×9 IMPLANT
IV NS IRRIG 3000ML ARTHROMATIC (IV SOLUTION) ×3 IMPLANT
KIT TURNOVER CYSTO (KITS) ×3 IMPLANT
PACK CYSTO (CUSTOM PROCEDURE TRAY) ×3 IMPLANT
PAD ARMBOARD 7.5X6 YLW CONV (MISCELLANEOUS) ×3 IMPLANT
PLUG CATH AND CAP STER (CATHETERS) ×3 IMPLANT
SYR 30ML LL (SYRINGE) ×3 IMPLANT
SYR TOOMEY IRRIG 70ML (MISCELLANEOUS) ×3
SYRINGE TOOMEY IRRIG 70ML (MISCELLANEOUS) ×1 IMPLANT
TOWEL OR 17X26 4PK STRL BLUE (TOWEL DISPOSABLE) ×3 IMPLANT

## 2020-01-21 NOTE — H&P (Signed)
Urology Admission H&P  Chief Complaint: recurrent bladder cancer  History of Present Illness: [name] is a 69yo with a history of bladder cancer who was found to have a recurrance on office cystoscopy in 09/2019. No hematuria or dysuria. No significant LUTS  Past Medical History:  Diagnosis Date  . Acute viral pericarditis 09/03/2018   Inferior STE - but Negative Troponin.  CP &SOB.  Felt to be Viral.  . Anemia   . Arthritis   . Bladder cancer ([place])   . Depression   . Diabetes mellitus without complication ([place])   . Essential hypertension 01/20/2018   in the past no longer on medication  . GERD (gastroesophageal reflux disease)   . Hyperlipidemia with target LDL less than 70 12/19/2015  . Multivessel CAD - CTO dLAD, mCx. DES PCI RI, PTCA of RPDA 01/21/2018   12/2017 - Cath for ? STEMI -> distal/apical LAD & m-dCx CTO (unable to cross Cx).  Mod rPDA. Severe RI - DES PCI.  08/2018 - Cath for ? Inf STEMI - RI stent patent & CTO dLAD/mCx. Progression of rPDA 95% -> PTCA only.  Thought to be Pericarditis & not MI (troponin negative).   . Neuromuscular disorder ([place])   . Sleep apnea    BiPAP not currently being used  . Status post tendon repair 1989  . STEMI (ST elevation myocardial infarction) ([place]) 01/20/2018   Cardiac cath January 21, 2018: Severe multivessel disease with unclear lesion, but opted for PCI/DES x1 to the RI.  Appeared to have CTO of the distal LAD and circumflex as well as moderate mid LAD and diagonal disease as well as PDA.Marland Kitchen  Unable to cross circumflex lesion.  . Stroke ([place])    At times pt has dizziness with loss of vision  . Tremors of nervous system 2011   Past Surgical History:  Procedure Laterality Date  . APPENDECTOMY    . BLADDER SURGERY    . COLONOSCOPY    . CORONARY STENT INTERVENTION N/A 01/21/2018   Procedure: CORONARY STENT INTERVENTION;  Surgeon: Leonie Man, MD;  Location: [place] [facility];  Service: Cardiovascular;  Laterality: N/A;  95% Ramus  Intermedius -PCI with synergy DES 2.25 mm x 12 mm postdilated 2.4 mm.  Remus Blake ACUTE MI REVASCULARIZATION N/A 01/21/2018   Procedure: Coronary/Graft Acute MI Revascularization;  Surgeon: Leonie Man, MD;  Location: [place] [facility];  Service: Cardiovascular;  Laterality: N/A;  attempted revas to distal CFX;   . CORONARY/GRAFT ACUTE MI REVASCULARIZATION N/A 09/04/2018   Procedure: Coronary/Graft Acute MI Revascularization;  Surgeon: Sherren Mocha, MD;  Location: [place] [facility]:: PTCA/POBA of rPDA (2.0 mm balloon)  . CYSTOSCOPY W/ RETROGRADES Bilateral 08/22/2015   Procedure: CYSTOSCOPY WITH RETROGRADE PYELOGRAM;  Surgeon: Irine Seal, MD;  Location: [facility];  Service: Urology;  Laterality: Bilateral;  . CYSTOSCOPY W/ RETROGRADES Bilateral 01/16/2016   Procedure: CYSTOSCOPY WITH RETROGRADE PYELOGRAM;  Surgeon: Irine Seal, MD;  Location: [facility];  Service: Urology;  Laterality: Bilateral;  . CYSTOSCOPY W/ RETROGRADES Bilateral 01/07/2017   Procedure: CYSTOSCOPY WITH RETROGRADE PYELOGRAM;  Surgeon: Irine Seal, MD;  Location: [facility];  Service: Urology;  Laterality: Bilateral;  . CYSTOSCOPY WITH BIOPSY N/A 08/22/2015   Procedure: CYSTOSCOPY WITH BLADDER BIOPSY;  Surgeon: Irine Seal, MD;  Location: [facility];  Service: Urology;  Laterality: N/A;  . CYSTOSCOPY WITH BIOPSY N/A 01/16/2016   Procedure: CYSTOSCOPY WITH BLADDER BIOPSY;  Surgeon: Irine Seal, MD;  Location: [facility];  Service: Urology;  Laterality: N/A;  . CYSTOSCOPY WITH FULGERATION N/A 01/16/2016   Procedure: CYSTOSCOPY WITH FULGERATION;  Surgeon: Irine Seal, MD;  Location: [facility];  Service: Urology;  Laterality: N/A;  . KNEE ARTHROSCOPY Right 1989  . LEFT HEART CATH AND CORONARY ANGIOGRAPHY N/A 01/21/2018   Procedure: LEFT HEART CATH AND CORONARY ANGIOGRAPHY;  Surgeon: Leonie Man, MD;  Location: [place] [facility];  Service: Cardiovascular;; 95% RI-DES PCI.  80% o-pCX (PTCA) -> dCX 80%-100% CTO (unsuccessful PTCA unable to cross  distal lesion with balloon.)  100% CTO dLAD. RPDA 80% and 70% as well as P AV 180%, PA V2 60%.  (too small for PCI)  . LEFT HEART CATH AND CORONARY ANGIOGRAPHY N/A 09/04/2018   Procedure: LEFT HEART CATH AND CORONARY ANGIOGRAPHY;  Surgeon: Sherren Mocha, MD;  Location: [place] [facility]: (presumed Inferior STEMI) = progression of small rPDA -95% (PTCA only). Patent RI stent. CTO of apical LAD & mCx.  Marland Kitchen ROTATOR CUFF REPAIR Bilateral 2000  . rt. leg fracture surgery    . TRANSTHORACIC ECHOCARDIOGRAM  01/21/2018   Mild LVH.  EF 50 and 55%.  Apical inferior hypokinesis.  Mid-apical anterolateral hypokinesis.  Normal diastolic function for age   . TRANSTHORACIC ECHOCARDIOGRAM  09/04/2018   -? Inf STEMI vs. Pericarditis:  Mildly reduced EF of 45 to 50%.  Impaired relaxation-GR 1 DD.  Severe hypokinesis of the anterolateral and inferolateral wall.  Small-moderate anterior pericardial effusion.  Moderate aortic sclerosis but no stenosis.  . TRANSURETHRAL RESECTION OF BLADDER TUMOR N/A 01/07/2017   Procedure: TRANSURETHRAL RESECTION OF BLADDER TUMOR (TURBT);  Surgeon: Irine Seal, MD;  Location: [facility];  Service: Urology;  Laterality: N/A;  . WISDOM TOOTH EXTRACTION      Home Medications:  Current Facility-Administered Medications  Medication Dose Route Frequency Provider Last Rate Last Admin  . ceFAZolin (ANCEF) IVPB 2g/100 mL premix  2 g Intravenous 30 min Pre-Op Everlean Bucher, Candee Furbish, MD       Allergies:  Allergies  Allergen Reactions  . Tramadol Shortness Of Breath    And dizziness  . Trulicity [Dulaglutide] Swelling    Ankles and feet swell    Family History  Problem Relation Age of Onset  . Diabetes Mother 60       type 1  . Stroke Mother   . Dementia Father   . Diabetes Brother   . Heart attack Brother 24  . Testicular cancer Brother   . Diabetes Brother   . Cancer Brother        testicular  . Colon cancer Neg Hx   . Colon polyps Neg Hx   . Esophageal cancer Neg Hx   . Rectal  cancer Neg Hx   . Stomach cancer Neg Hx    Social History:  reports that he has been smoking cigarettes. He has a 50.00 pack-year smoking history. He has never used smokeless tobacco. He reports that he does not drink alcohol and does not use drugs.  Review of Systems  All other systems reviewed and are negative.   Physical Exam:  Vital signs in last 24 hours: Temp:  [97.8 F (36.6 C)] 97.8 F (36.6 C) (08/30 1038) Resp:  [15] 15 (08/30 1038) BP: (118)/(79) 118/79 (08/30 1038) SpO2:  [93 %] 93 % (08/30 1038) Physical Exam Vitals reviewed.  Constitutional:      Appearance: Normal appearance.  HENT:     Head: Normocephalic and atraumatic.  Eyes:     Extraocular Movements: Extraocular movements intact.  Pupils: Pupils are equal, round, and reactive to light.  Cardiovascular:     Rate and Rhythm: Normal rate and regular rhythm.  Pulmonary:     Effort: Pulmonary effort is normal. No respiratory distress.  Abdominal:     General: Abdomen is flat. There is no distension.  Musculoskeletal:     Cervical back: Normal range of motion and neck supple.  Skin:    General: Skin is warm and dry.  Neurological:     General: No focal deficit present.     Mental Status: He is alert and oriented to person, place, and time.  Psychiatric:        Mood and Affect: Mood normal.        Behavior: Behavior normal.        Thought Content: Thought content normal.        Judgment: Judgment normal.     Laboratory Data:  Results for orders placed or performed during the hospital encounter of 01/21/20 (from the past 24 hour(s))  Glucose, capillary     Status: Abnormal   Collection Time: 01/21/20 10:42 AM  Result Value Ref Range   Glucose-Capillary 190 (H) 70 - 99 mg/dL   Recent Results (from the past 240 hour(s))  SARS CORONAVIRUS 2 (TAT 6-24 HRS) Nasopharyngeal Nasopharyngeal Swab     Status: None   Collection Time: 01/18/20 10:15 AM   Specimen: Nasopharyngeal Swab  Result Value Ref Range  Status   SARS Coronavirus 2 NEGATIVE NEGATIVE Final    Comment: (NOTE) SARS-CoV-2 target nucleic acids are NOT DETECTED.  The SARS-CoV-2 RNA is generally detectable in upper and lower respiratory specimens during the acute phase of infection. Negative results do not preclude SARS-CoV-2 infection, do not rule out co-infections with other pathogens, and should not be used as the sole basis for treatment or other patient management decisions. Negative results must be combined with clinical observations, patient history, and epidemiological information. The expected result is Negative.  Fact Sheet for Patients: SugarRoll.be  Fact Sheet for Healthcare Providers: https://www.woods-mathews.com/  This test is not yet approved or cleared by the [place] FDA and  has been authorized for detection and/or diagnosis of SARS-CoV-2 by FDA under an Emergency Use Authorization (EUA). This EUA will remain  in effect (meaning this test can be used) for the duration of the COVID-19 declaration under Se ction 564(b)(1) of the Act, 21 U.S.C. section 360bbb-3(b)(1), unless the authorization is terminated or revoked sooner.  Performed at [facility], [place] [address] [place], [place] [address]    Creatinine: Recent Labs    01/18/20 1023  CREATININE 0.89   Baseline Creatinine: 0.9  Impression/Assessment:  68yo with recurrent bladder tumor  Plan:  The risks/benefits/alternatives to transurethral resection of a bladder tumor was explained to the patient and he understands and wishes to proceed with surgery  Nicolette Bang 01/21/2020, 11:44 AM

## 2020-01-21 NOTE — Anesthesia Postprocedure Evaluation (Signed)
Anesthesia Post Note  Patient: Andrew [name]  Procedure(s) Performed: CYSTOSCOPY (N/A ) TRANSURETHRAL RESECTION OF BLADDER TUMOR (TURBT) (N/A )  Patient location during evaluation: PACU Anesthesia Type: General Level of consciousness: awake and alert Pain management: pain level controlled Vital Signs Assessment: post-procedure vital signs reviewed and stable Respiratory status: spontaneous breathing Cardiovascular status: stable Postop Assessment: no apparent nausea or vomiting Anesthetic complications: no   No complications documented.   Last Vitals:  Vitals:   01/21/20 1445 01/21/20 1506  BP: 127/76 127/68  Pulse: 73 67  Resp: 15 18  Temp:  36.6 C  SpO2: 92% 95%    Last Pain:  Vitals:   01/21/20 1506  TempSrc: Oral  PainSc: 0-No pain                 Everette Rank

## 2020-01-21 NOTE — Op Note (Signed)
.  Preoperative diagnosis: bladder tumor  Postoperative diagnosis: Same  Procedure: 1 cystoscopy 2. Transurethral resection of bladder tumor, medium 3. urethral dilation from 12 french to 30 french  Attending: Nicolette Bang  Anesthesia: General  Estimated blood loss: Minimal  Drains: 22 French foley  Specimens: dome bladder tumor  Antibiotics: ancef  Findings: multiple 1-1.5cm papillary dome tumors.  Ureteral orifices in normal anatomic location.   Indications: Patient is a 69 year old male with a history of bladder tumor found on office cystoscopy.  After discussing treatment options, they decided proceed with transurethral resection of a bladder tumor.  Procedure her in detail: The patient was brought to the operating room and a brief timeout was done to ensure correct patient, correct procedure, correct site.  General anesthesia was administered patient was placed in dorsal lithotomy position.  Their genitalia was then prepped and draped in usual sterile fashion.  We attempted to pass a 22 french cystoscope but he had narrowing of the meatus. Using the sequential dilators we dilated the urethra from 12 french to 30 french. A rigid 6 French cystoscope was passed in the urethra and the bladder.  Bladder was inspected and we noted multiple 1-1.5cm  dome bladder tumors.  the ureteral orifices were in the normal orthotopic locations.  We then removed the cystoscope and placed a resectoscope into the bladder. Using the bipolar resectoscope we removed the bladder tumor down to the base.  Hemostasis was then obtained with electrocautery. We then removed the bladder tumor chips and sent them for pathology. We then re-inspected the bladder and found no residula bleeding.  the bladder was then drained, a 22 French foley was placed and this concluded the procedure which was well tolerated by patient.  Complications: None  Condition: Stable, extubated, transferred to PACU  Plan: Patient is to  be discharged home and followup in 5 days for foley catheter removal and pathology discussion.

## 2020-01-21 NOTE — Anesthesia Procedure Notes (Signed)
Procedure Name: LMA Insertion Date/Time: 01/21/2020 12:43 PM Performed by: Georgeanne Nim, CRNA Pre-anesthesia Checklist: Patient identified, Emergency Drugs available, Suction available, Patient being monitored and Timeout performed Patient Re-evaluated:Patient Re-evaluated prior to induction Oxygen Delivery Method: Circle system utilized Preoxygenation: Pre-oxygenation with 100% oxygen Induction Type: IV induction LMA: LMA inserted LMA Size: 5.0 Number of attempts: 1 Placement Confirmation: positive ETCO2,  CO2 detector and breath sounds checked- equal and bilateral Tube secured with: Tape Dental Injury: Teeth and Oropharynx as per pre-operative assessment

## 2020-01-21 NOTE — Discharge Instructions (Signed)
Indwelling Urinary Catheter Care, Adult An indwelling urinary catheter is a thin tube that is put into your bladder. The tube helps to drain pee (urine) out of your body. The tube goes in through your urethra. Your urethra is where pee comes out of your body. Your pee will come out through the catheter, then it will go into a bag (drainage bag). Take good care of your catheter so it will work well. How to wear your catheter and bag Supplies needed  Sticky tape (adhesive tape) or a leg strap.  Alcohol wipe or soap and water (if you use tape).  A clean towel (if you use tape).  Large overnight bag.  Smaller bag (leg bag). Wearing your catheter Attach your catheter to your leg with tape or a leg strap.  Make sure the catheter is not pulled tight.  If a leg strap gets wet, take it off and put on a dry strap.  If you use tape to hold the bag on your leg: 1. Use an alcohol wipe or soap and water to wash your skin where the tape made it sticky before. 2. Use a clean towel to pat-dry that skin. 3. Use new tape to make the bag stay on your leg. Wearing your bags You should have been given a large overnight bag.  You may wear the overnight bag in the day or night.  Always have the overnight bag lower than your bladder.  Do not let the bag touch the floor.  Before you go to sleep, put a clean plastic bag in a wastebasket. Then hang the overnight bag inside the wastebasket. You should also have a smaller leg bag that fits under your clothes.  Always wear the leg bag below your knee.  Do not wear your leg bag at night. How to care for your skin and catheter Supplies needed  A clean washcloth.  Water and mild soap.  A clean towel. Caring for your skin and catheter      Clean the skin around your catheter every day: 1. Wash your hands with soap and water. 2. Wet a clean washcloth in warm water and mild soap. 3. Clean the skin around your urethra.  If you are  male:  Gently spread the folds of skin around your vagina (labia).  With the washcloth in your other hand, wipe the inner side of your labia on each side. Wipe from front to back.  If you are male:  Pull back any skin that covers the end of your penis (foreskin).  With the washcloth in your other hand, wipe your penis in small circles. Start wiping at the tip of your penis, then move away from the catheter.  Move the foreskin back in place, if needed. 4. With your free hand, hold the catheter close to where it goes into your body.  Keep holding the catheter during cleaning so it does not get pulled out. 5. With the washcloth in your other hand, clean the catheter.  Only wipe downward on the catheter.  Do not wipe upward toward your body. Doing this may push germs into your urethra and cause infection. 6. Use a clean towel to pat-dry the catheter and the skin around it. Make sure to wipe off all soap. 7. Wash your hands with soap and water.  Shower every day. Do not take baths.  Do not use cream, ointment, or lotion on the area where the catheter goes into your body, unless your doctor tells you   to.  Do not use powders, sprays, or lotions on your genital area.  Check your skin around the catheter every day for signs of infection. Check for: ? Redness, swelling, or pain. ? Fluid or blood. ? Warmth. ? Pus or a bad smell. How to empty the bag Supplies needed  Rubbing alcohol.  Gauze pad or cotton ball.  Tape or a leg strap. Emptying the bag Pour the pee out of your bag when it is ?- full, or at least 2-3 times a day. Do this for your overnight bag and your leg bag. 1. Wash your hands with soap and water. 2. Separate (detach) the bag from your leg. 3. Hold the bag over the toilet or a clean pail. Keep the bag lower than your hips and bladder. This is so the pee (urine) does not go back into the tube. 4. Open the pour spout. It is at the bottom of the bag. 5. Empty the  pee into the toilet or pail. Do not let the pour spout touch any surface. 6. Put rubbing alcohol on a gauze pad or cotton ball. 7. Use the gauze pad or cotton ball to clean the pour spout. 8. Close the pour spout. 9. Attach the bag to your leg with tape or a leg strap. 10. Wash your hands with soap and water. Follow instructions for cleaning the drainage bag:  From the product maker.  As told by your doctor. How to change the bag Supplies needed  Alcohol wipes.  A clean bag.  Tape or a leg strap. Changing the bag Replace your bag when it starts to leak, smell bad, or look dirty. 1. Wash your hands with soap and water. 2. Separate the dirty bag from your leg. 3. Pinch the catheter with your fingers so that pee does not spill out. 4. Separate the catheter tube from the bag tube where these tubes connect (at the connection valve). Do not let the tubes touch any surface. 5. Clean the end of the catheter tube with an alcohol wipe. Use a different alcohol wipe to clean the end of the bag tube. 6. Connect the catheter tube to the tube of the clean bag. 7. Attach the clean bag to your leg with tape or a leg strap. Do not make the bag tight on your leg. 8. Wash your hands with soap and water. General rules   Never pull on your catheter. Never try to take it out. Doing that can hurt you.  Always wash your hands before and after you touch your catheter or bag. Use a mild, fragrance-free soap. If you do not have soap and water, use hand sanitizer.  Always make sure there are no twists or bends (kinks) in the catheter tube.  Always make sure there are no leaks in the catheter or bag.  Drink enough fluid to keep your pee pale yellow.  Do not take baths, swim, or use a hot tub.  If you are male, wipe from front to back after you poop (have a bowel movement). Contact a doctor if:  Your pee is cloudy.  Your pee smells worse than usual.  Your catheter gets clogged.  Your catheter  leaks.  Your bladder feels full. Get help right away if:  You have redness, swelling, or pain where the catheter goes into your body.  You have fluid, blood, pus, or a bad smell coming from the area where the catheter goes into your body.  Your skin feels warm where   the catheter goes into your body.  You have a fever.  You have pain in your: ? Belly (abdomen). ? Legs. ? Lower back. ? Bladder.  You see blood in the catheter.  Your pee is pink or red.  You feel sick to your stomach (nauseous).  You throw up (vomit).  You have chills.  Your pee is not draining into the bag.  Your catheter gets pulled out. Summary  An indwelling urinary catheter is a thin tube that is placed into the bladder to help drain pee (urine) out of the body.  The catheter is placed into the part of the body that drains pee from the bladder (urethra).  Taking good care of your catheter will keep it working properly and help prevent problems.  Always wash your hands before and after touching your catheter or bag.  Never pull on your catheter or try to take it out. This information is not intended to replace advice given to you by your health care provider. Make sure you discuss any questions you have with your health care provider. Document Revised: 09/01/2018 Document Reviewed: 12/24/2016 Elsevier Patient Education  [place].    Transurethral Resection of Bladder Tumor  Transurethral resection of a bladder tumor is the removal (resection) of a cancerous growth (tumor) on the inside wall of the bladder. The bladder is the organ that holds urine. The tumor is removed through the tube that carries urine out of the body (urethra). In a transurethral resection, a thin telescope with a light, a tiny camera, and an electric cutting edge (resectoscope) is passed through the urethra. In men, the opening of the urethra is at the end of the penis. In women, it is just above the opening of the  vagina. Tell a health care provider about:  Any allergies you have.  All medicines you are taking, including vitamins, herbs, eye drops, creams, and over-the-counter medicines.  Any problems you or family members have had with anesthetic medicines.  Any blood disorders you have.  Any surgeries you have had.  Any medical conditions you have.  Any recent urinary tract infections you have had.  Whether you are pregnant or may be pregnant. What are the risks? Generally, this is a safe procedure. However, problems may occur, including:  Infection.  Bleeding.  Allergic reactions to medicines.  Damage to nearby structures or organs, such as: ? The urethra. ? The tubes that drain urine from the kidneys into the bladder (ureters).  Pain and burning during urination.  Difficulty urinating due to partial blockage of the urethra.  Inability to urinate (urinary retention). What happens before the procedure? Staying hydrated Follow instructions from your health care provider about hydration, which may include:  Up to 2 hours before the procedure - you may continue to drink clear liquids, such as water, clear fruit juice, black coffee, and plain tea.  Eating and drinking restrictions Follow instructions from your health care provider about eating and drinking, which may include:  8 hours before the procedure - stop eating heavy meals or foods, such as meat, fried foods, or fatty foods.  6 hours before the procedure - stop eating light meals or foods, such as toast or cereal.  6 hours before the procedure - stop drinking milk or drinks that contain milk.  2 hours before the procedure - stop drinking clear liquids. Medicines Ask your health care provider about:  Changing or stopping your regular medicines. This is especially important if you are taking  diabetes medicines or blood thinners.  Taking medicines such as aspirin and ibuprofen. These medicines can thin your blood. Do  not take these medicines unless your health care provider tells you to take them.  Taking over-the-counter medicines, vitamins, herbs, and supplements. Tests You may have exams or tests, including:  Physical exam.  Blood tests.  Urine tests.  Electrocardiogram (ECG). This test measures the electrical activity of the heart. General instructions  Plan to have someone take you home from the hospital or clinic.  Ask your health care provider how your surgical site will be marked or identified.  Ask your health care provider what steps will be taken to help prevent infection. These may include: ? Washing skin with a germ-killing soap. ? Taking antibiotic medicine. What happens during the procedure?  An IV will be inserted into one of your veins.  You will be given one or more of the following: ? A medicine to help you relax (sedative). ? A medicine to make you fall asleep (general anesthetic). ? A medicine that is injected into your spine to numb the area below and slightly above the injection site (spinal anesthetic).  Your legs will be placed in foot rests (stirrups) so that your legs are apart and your knees are bent.  The resectoscope will be passed through your urethra and into your bladder.  The part of your bladder that is affected by the tumor will be resected using the cutting edge of the resectoscope.  The resectoscope will be removed.  A thin, flexible tube (catheter) will be passed through your urethra and into your bladder. The catheter will drain urine into a bag outside of your body. ? Fluid may be passed through the catheter to keep the catheter open. The procedure may vary among health care providers and hospitals. What happens after the procedure?  Your blood pressure, heart rate, breathing rate, and blood oxygen level will be monitored until you leave the hospital or clinic.  You may continue to receive fluids and medicines through an IV.  You will have  some pain. You will be given pain medicine to relieve pain.  You will have a catheter to drain your urine. ? You will have blood in your urine. Your catheter may be kept in until your urine is clear. ? The amount of urine will be monitored. If necessary, your bladder may be rinsed out (irrigated) by passing fluid through your catheter.  You will be encouraged to walk around as soon as possible.  You may have to wear compression stockings. These stockings help to prevent blood clots and reduce swelling in your legs.  Do not drive for 24 hours if you were given a sedative during your procedure. Summary  Transurethral resection of a bladder tumor is the removal (resection) of a cancerous growth (tumor) on the inside wall of the bladder.  To do this procedure, your health care provider uses a thin telescope with a light, a tiny camera, and an electric cutting edge (resectoscope).  Follow your health care provider's instructions. You may need to stop or change certain medicines, and you may be told to stop eating and drinking several hours before the procedure.  Your blood pressure, heart rate, breathing rate, and blood oxygen level will be monitored until you leave the hospital or clinic.  You may have to wear compression stockings. These stockings help to prevent blood clots and reduce swelling in your legs. This information is not intended to replace advice given to  you by your health care provider. Make sure you discuss any questions you have with your health care provider. Document Revised: 12/09/2017 Document Reviewed: 12/09/2017 Elsevier Patient Education  [place].   Transurethral Resection of Bladder Tumor, Care After This sheet gives you information about how to care for yourself after your procedure. Your health care provider may also give you more specific instructions. If you have problems or questions, contact your health care provider. What can I expect after the  procedure? After the procedure, it is common to have:  A small amount of blood in your urine for up to 2 weeks.  Soreness or mild pain from your catheter. After your catheter is removed, you may have mild soreness, especially when urinating.  Pain in your lower abdomen. Follow these instructions at home: Medicines   Take over-the-counter and prescription medicines only as told by your health care provider.  If you were prescribed an antibiotic medicine, take it as told by your health care provider. Do not stop taking the antibiotic even if you start to feel better.  Do not drive for 24 hours if you were given a sedative during your procedure.  Ask your health care provider if the medicine prescribed to you: ? Requires you to avoid driving or using heavy machinery. ? Can cause constipation. You may need to take these actions to prevent or treat constipation:  Take over-the-counter or prescription medicines.  Eat foods that are high in fiber, such as beans, whole grains, and fresh fruits and vegetables.  Limit foods that are high in fat and processed sugars, such as fried or sweet foods. Activity  Return to your normal activities as told by your health care provider. Ask your health care provider what activities are safe for you.  Do not lift anything that is heavier than 10 lb (4.5 kg), or the limit that you are told, until your health care provider says that it is safe.  Avoid intense physical activity for as long as told by your health care provider.  Rest as told by your health care provider.  Avoid sitting for a long time without moving. Get up to take short walks every 1-2 hours. This is important to improve blood flow and breathing. Ask for help if you feel weak or unsteady. General instructions   Do not drink alcohol for as long as told by your health care provider. This is especially important if you are taking prescription pain medicines.  Do not take baths, swim,  or use a hot tub until your health care provider approves. Ask your health care provider if you may take showers. You may only be allowed to take sponge baths.  If you have a catheter, follow instructions from your health care provider about caring for your catheter and your drainage bag.  Drink enough fluid to keep your urine pale yellow.  Wear compression stockings as told by your health care provider. These stockings help to prevent blood clots and reduce swelling in your legs.  Keep all follow-up visits as told by your health care provider. This is important. ? You will need to be followed closely with regular checks of your bladder and urethra (cystoscopies) to make sure that the cancer does not come back. Contact a health care provider if:  You have pain that gets worse or does not improve with medicine.  You have blood in your urine for more than 2 weeks.  You have cloudy or bad-smelling urine.  You become  constipated. Signs of constipation may include having: ? Fewer than three bowel movements in a week. ? Difficulty having a bowel movement. ? Stools that are dry, hard, or larger than normal.  You have a fever. Get help right away if:  You have: ? Severe pain. ? Bright red blood in your urine. ? Blood clots in your urine. ? A lot of blood in your urine.  Your catheter has been removed and you are not able to urinate.  You have a catheter in place and the catheter is not draining urine. Summary  After your procedure, it is common to have a small amount of blood in your urine, soreness or mild pain from your catheter, and pain in your lower abdomen.  Take over-the-counter and prescription medicines only as told by your health care provider.  Rest as told by your health care provider. Follow your health care provider's instructions about returning to normal activities. Ask what activities are safe for you.  If you have a catheter, follow instructions from your health  care provider about caring for your catheter and your drainage bag.  Get help right away if you cannot urinate, you have severe pain, or you have bright red blood or blood clots in your urine. This information is not intended to replace advice given to you by your health care provider. Make sure you discuss any questions you have with your health care provider. Document Revised: 12/08/2017 Document Reviewed: 12/08/2017 Elsevier Patient Education  2020 [place] Anesthesia, Adult, Care After This sheet gives you information about how to care for yourself after your procedure. Your health care provider may also give you more specific instructions. If you have problems or questions, contact your health care provider. What can I expect after the procedure? After the procedure, the following side effects are common:  Pain or discomfort at the IV site.  Nausea.  Vomiting.  Sore throat.  Trouble concentrating.  Feeling cold or chills.  Weak or tired.  Sleepiness and fatigue.  Soreness and body aches. These side effects can affect parts of the body that were not involved in surgery. Follow these instructions at home:  For at least 24 hours after the procedure:  Have a responsible adult stay with you. It is important to have someone help care for you until you are awake and alert.  Rest as needed.  Do not: ? Participate in activities in which you could fall or become injured. ? Drive. ? Use heavy machinery. ? Drink alcohol. ? Take sleeping pills or medicines that cause drowsiness. ? Make important decisions or sign legal documents. ? Take care of children on your own. Eating and drinking  Follow any instructions from your health care provider about eating or drinking restrictions.  When you feel hungry, start by eating small amounts of foods that are soft and easy to digest (bland), such as toast. Gradually return to your regular diet.  Drink enough fluid to  keep your urine pale yellow.  If you vomit, rehydrate by drinking water, juice, or clear broth. General instructions  If you have sleep apnea, surgery and certain medicines can increase your risk for breathing problems. Follow instructions from your health care provider about wearing your sleep device: ? Anytime you are sleeping, including during daytime naps. ? While taking prescription pain medicines, sleeping medicines, or medicines that make you drowsy.  Return to your normal activities as told by your health care provider. Ask your health care provider what  activities are safe for you.  Take over-the-counter and prescription medicines only as told by your health care provider.  If you smoke, do not smoke without supervision.  Keep all follow-up visits as told by your health care provider. This is important. Contact a health care provider if:  You have nausea or vomiting that does not get better with medicine.  You cannot eat or drink without vomiting.  You have pain that does not get better with medicine.  You are unable to pass urine.  You develop a skin rash.  You have a fever.  You have redness around your IV site that gets worse. Get help right away if:  You have difficulty breathing.  You have chest pain.  You have blood in your urine or stool, or you vomit blood. Summary  After the procedure, it is common to have a sore throat or nausea. It is also common to feel tired.  Have a responsible adult stay with you for the first 24 hours after general anesthesia. It is important to have someone help care for you until you are awake and alert.  When you feel hungry, start by eating small amounts of foods that are soft and easy to digest (bland), such as toast. Gradually return to your regular diet.  Drink enough fluid to keep your urine pale yellow.  Return to your normal activities as told by your health care provider. Ask your health care provider what activities  are safe for you. This information is not intended to replace advice given to you by your health care provider. Make sure you discuss any questions you have with your health care provider. Document Revised: 05/13/2017 Document Reviewed: 12/24/2016 Elsevier Patient Education  [place].

## 2020-01-21 NOTE — Progress Notes (Signed)
Reviewed with patient and daughter Andrew [name], foley catheter care and maintenance. Foley is to to standard drainage bag, reviewed emptying with daughter and patient, given urinal and alcohol preps to take home for foley care. Emphasized to keep drainage bag hanging below bladder and not laying on the floor for optimal drainage as well as hydration. Reviewed s/s of urinary retention and where to call with problems, see discharge printed information for details. Verbalized understanding.

## 2020-01-21 NOTE — Transfer of Care (Signed)
Immediate Anesthesia Transfer of Care Note  Patient: Andrew [name]  Procedure(s) Performed: CYSTOSCOPY (N/A ) TRANSURETHRAL RESECTION OF BLADDER TUMOR (TURBT) (N/A )  Patient Location: PACU  Anesthesia Type:General  Level of Consciousness: drowsy and patient cooperative  Airway & Oxygen Therapy: Patient Spontanous Breathing  Post-op Assessment: Report given to RN and Post -op Vital signs reviewed and stable  Post vital signs: Reviewed and stable  Last Vitals:  Vitals Value Taken Time  BP 140/76 01/21/20 1334  Temp    Pulse 74 01/21/20 1334  Resp 19 01/21/20 1334  SpO2 92 % 01/21/20 1334  Vitals shown include unvalidated device data.  Last Pain:  Vitals:   01/21/20 1038  TempSrc: Oral  PainSc: 0-No pain      Patients Stated Pain Goal: 9 (81/85/63 [id number])  Complications: No complications documented.

## 2020-01-21 NOTE — Anesthesia Preprocedure Evaluation (Addendum)
Anesthesia Evaluation  Patient identified by MRN, date of birth, ID band Patient awake    Reviewed: Allergy & Precautions, NPO status , Patient's Chart, lab work & pertinent test results, reviewed documented beta blocker date and time   History of Anesthesia Complications (+) PROLONGED EMERGENCE and history of anesthetic complications  Airway Mallampati: II  TM Distance: >3 FB Neck ROM: Full    Dental  (+) Dental Advisory Given, Missing   Pulmonary sleep apnea , COPD,  COPD inhaler, Current Smoker and Patient abstained from smoking.,    Pulmonary exam normal breath sounds clear to auscultation       Cardiovascular Exercise Tolerance: Poor hypertension (last dose of bisoprolol - 01/19/20), Pt. on medications and Pt. on home beta blockers + angina with exertion + CAD, + Past MI, + Cardiac Stents and + DOE  Normal cardiovascular exam Rhythm:Regular Rate:Normal  2020- cath 1.  Multivessel coronary artery disease with chronic occlusion of the apical LAD and mid circumflex, continued patency of the stented segment in the ramus intermedius, and severe progressive stenosis of the right PDA 2.  Mild segmental contraction abnormality the left ventricle with preserved overall LVEF estimated at 55 to 60% 3.  Normal LVEDP 4.  Successful balloon angioplasty of the right PDA, treated with p.o. BA because of small vessel caliber  Recommendations: Check 2D echo, overnight care in the cardiac ICU, post MI medical therapy.  We will cycle cardiac enzymes.  The patient has had fairly prolonged symptoms with negative cardiac markers.  Other considerations include pericarditis or myopericarditis.  However, with his most pronounced ST elevation inferiorly and severe stenosis in the right PDA, I elected to proceed with PCI for threatened vessel closure as outlined above.    Neuro/Psych PSYCHIATRIC DISORDERS Anxiety Depression  Neuromuscular disease CVA     GI/Hepatic GERD  Medicated and Controlled,  Endo/Other  diabetes, Well Controlled, Type 2, Oral Hypoglycemic Agents  Renal/GU    Bladder cancer     Musculoskeletal  (+) Arthritis ,   Abdominal   Peds  Hematology  (+) anemia ,   Anesthesia Other Findings   Reproductive/Obstetrics                           Anesthesia Physical Anesthesia Plan  ASA: IV  Anesthesia Plan: General   Post-op Pain Management:    Induction: Intravenous  PONV Risk Score and Plan:   Airway Management Planned: Oral ETT and LMA  Additional Equipment:   Intra-op Plan:   Post-operative Plan: Extubation in OR  Informed Consent: I have reviewed the patients History and Physical, chart, labs and discussed the procedure including the risks, benefits and alternatives for the proposed anesthesia with the patient or authorized representative who has indicated his/her understanding and acceptance.     Dental advisory given  Plan Discussed with: CRNA and Surgeon  Anesthesia Plan Comments: (Will give beta-blocker during the case.)       Anesthesia Quick Evaluation

## 2020-01-22 ENCOUNTER — Encounter (HOSPITAL_COMMUNITY): Payer: Self-pay | Admitting: Urology

## 2020-01-22 NOTE — Addendum Note (Signed)
Addendum  created 01/22/20 1257 by Vista Deck, CRNA   Charge Capture section accepted

## 2020-01-23 ENCOUNTER — Telehealth: Payer: Self-pay

## 2020-01-23 LAB — SURGICAL PATHOLOGY

## 2020-01-23 NOTE — Telephone Encounter (Signed)
See note

## 2020-01-25 ENCOUNTER — Ambulatory Visit: Payer: Medicare Other

## 2020-01-25 ENCOUNTER — Other Ambulatory Visit: Payer: Self-pay

## 2020-01-25 DIAGNOSIS — C679 Malignant neoplasm of bladder, unspecified: Secondary | ICD-10-CM

## 2020-01-25 NOTE — Progress Notes (Signed)
Fill and Pull Catheter Removal  Patient is present today for a catheter removal.  Patient was cleaned and prepped in a sterile fashion 146ml of sterile water/ saline was instilled into the bladder when the patient felt the urge to urinate. 75ml of water was then drained from the balloon.  A 22FR foley cath was removed from the bladder.  Patient as then given some time to void on their own.  Patient can void  39ml on their own after some time.  Patient was first filled with 100cc of water at the beginning. Pt had a bad spasm and lost all the water. Then pt was filled with 100cc again with urge to urinate. He then urinated the 59cc. Dr. Jeffie Pollock said pt could go with understanding to come back this afternoon if any problems. Pt asked about a med for spasms and if he had pain and had hot flashes. I tried to answer and Gibson Ramp RN finished up answering his questions.  Performed by: Valentina Lucks, LPN

## 2020-01-30 ENCOUNTER — Ambulatory Visit: Payer: Medicare Other | Admitting: Urology

## 2020-01-30 DIAGNOSIS — F17200 Nicotine dependence, unspecified, uncomplicated: Secondary | ICD-10-CM | POA: Diagnosis not present

## 2020-01-30 DIAGNOSIS — M25471 Effusion, right ankle: Secondary | ICD-10-CM | POA: Diagnosis not present

## 2020-01-30 DIAGNOSIS — M25475 Effusion, left foot: Secondary | ICD-10-CM | POA: Diagnosis not present

## 2020-01-30 DIAGNOSIS — M25474 Effusion, right foot: Secondary | ICD-10-CM | POA: Diagnosis not present

## 2020-01-30 DIAGNOSIS — M25472 Effusion, left ankle: Secondary | ICD-10-CM | POA: Diagnosis not present

## 2020-02-21 ENCOUNTER — Ambulatory Visit: Payer: Medicare Other | Admitting: Family Medicine

## 2020-02-25 DIAGNOSIS — I429 Cardiomyopathy, unspecified: Secondary | ICD-10-CM | POA: Diagnosis not present

## 2020-02-25 DIAGNOSIS — I252 Old myocardial infarction: Secondary | ICD-10-CM | POA: Diagnosis not present

## 2020-02-25 DIAGNOSIS — I10 Essential (primary) hypertension: Secondary | ICD-10-CM | POA: Diagnosis not present

## 2020-02-25 DIAGNOSIS — I200 Unstable angina: Secondary | ICD-10-CM | POA: Diagnosis not present

## 2020-02-25 DIAGNOSIS — I25119 Atherosclerotic heart disease of native coronary artery with unspecified angina pectoris: Secondary | ICD-10-CM | POA: Diagnosis not present

## 2020-02-25 DIAGNOSIS — Z8679 Personal history of other diseases of the circulatory system: Secondary | ICD-10-CM | POA: Diagnosis not present

## 2020-02-27 ENCOUNTER — Encounter: Payer: Self-pay | Admitting: Family Medicine

## 2020-02-29 DIAGNOSIS — I5189 Other ill-defined heart diseases: Secondary | ICD-10-CM | POA: Diagnosis not present

## 2020-02-29 DIAGNOSIS — I080 Rheumatic disorders of both mitral and aortic valves: Secondary | ICD-10-CM | POA: Diagnosis not present

## 2020-02-29 DIAGNOSIS — I517 Cardiomegaly: Secondary | ICD-10-CM | POA: Diagnosis not present

## 2020-03-04 DIAGNOSIS — G250 Essential tremor: Secondary | ICD-10-CM | POA: Diagnosis not present

## 2020-03-04 DIAGNOSIS — E1165 Type 2 diabetes mellitus with hyperglycemia: Secondary | ICD-10-CM | POA: Diagnosis not present

## 2020-03-04 DIAGNOSIS — E785 Hyperlipidemia, unspecified: Secondary | ICD-10-CM | POA: Diagnosis not present

## 2020-03-04 DIAGNOSIS — N400 Enlarged prostate without lower urinary tract symptoms: Secondary | ICD-10-CM | POA: Diagnosis not present

## 2020-03-04 DIAGNOSIS — I25119 Atherosclerotic heart disease of native coronary artery with unspecified angina pectoris: Secondary | ICD-10-CM | POA: Diagnosis not present

## 2020-03-04 DIAGNOSIS — C679 Malignant neoplasm of bladder, unspecified: Secondary | ICD-10-CM | POA: Diagnosis not present

## 2020-03-04 DIAGNOSIS — E1159 Type 2 diabetes mellitus with other circulatory complications: Secondary | ICD-10-CM | POA: Diagnosis not present

## 2020-03-04 DIAGNOSIS — K219 Gastro-esophageal reflux disease without esophagitis: Secondary | ICD-10-CM | POA: Diagnosis not present

## 2020-03-13 DIAGNOSIS — E1142 Type 2 diabetes mellitus with diabetic polyneuropathy: Secondary | ICD-10-CM | POA: Diagnosis not present

## 2020-03-13 DIAGNOSIS — L84 Corns and callosities: Secondary | ICD-10-CM | POA: Diagnosis not present

## 2020-03-13 DIAGNOSIS — B351 Tinea unguium: Secondary | ICD-10-CM | POA: Diagnosis not present

## 2020-03-13 DIAGNOSIS — M79676 Pain in unspecified toe(s): Secondary | ICD-10-CM | POA: Diagnosis not present

## 2020-04-21 DIAGNOSIS — R1010 Upper abdominal pain, unspecified: Secondary | ICD-10-CM | POA: Diagnosis not present

## 2020-04-21 DIAGNOSIS — E1165 Type 2 diabetes mellitus with hyperglycemia: Secondary | ICD-10-CM | POA: Diagnosis not present

## 2020-04-21 DIAGNOSIS — R6889 Other general symptoms and signs: Secondary | ICD-10-CM | POA: Diagnosis not present

## 2020-04-21 DIAGNOSIS — E1159 Type 2 diabetes mellitus with other circulatory complications: Secondary | ICD-10-CM | POA: Diagnosis not present

## 2020-04-21 DIAGNOSIS — T887XXA Unspecified adverse effect of drug or medicament, initial encounter: Secondary | ICD-10-CM | POA: Diagnosis not present

## 2020-04-21 DIAGNOSIS — G6189 Other inflammatory polyneuropathies: Secondary | ICD-10-CM | POA: Diagnosis not present

## 2020-05-05 DIAGNOSIS — R1013 Epigastric pain: Secondary | ICD-10-CM | POA: Diagnosis not present

## 2020-05-05 DIAGNOSIS — R1010 Upper abdominal pain, unspecified: Secondary | ICD-10-CM | POA: Diagnosis not present

## 2020-05-07 ENCOUNTER — Encounter: Payer: Self-pay | Admitting: Urology

## 2020-05-07 ENCOUNTER — Ambulatory Visit (INDEPENDENT_AMBULATORY_CARE_PROVIDER_SITE_OTHER): Payer: Medicare Other | Admitting: Urology

## 2020-05-07 ENCOUNTER — Other Ambulatory Visit: Payer: Self-pay

## 2020-05-07 VITALS — BP 103/60 | HR 85 | Temp 98.3°F | Ht 76.0 in | Wt 192.0 lb

## 2020-05-07 DIAGNOSIS — C679 Malignant neoplasm of bladder, unspecified: Secondary | ICD-10-CM

## 2020-05-07 LAB — URINALYSIS, ROUTINE W REFLEX MICROSCOPIC
Bilirubin, UA: NEGATIVE
Ketones, UA: NEGATIVE
Leukocytes,UA: NEGATIVE
Nitrite, UA: NEGATIVE
Protein,UA: NEGATIVE
Specific Gravity, UA: 1.015 (ref 1.005–1.030)
Urobilinogen, Ur: 0.2 mg/dL (ref 0.2–1.0)
pH, UA: 6.5 (ref 5.0–7.5)

## 2020-05-07 LAB — MICROSCOPIC EXAMINATION
Epithelial Cells (non renal): NONE SEEN /hpf (ref 0–10)
Renal Epithel, UA: NONE SEEN /hpf
WBC, UA: NONE SEEN /hpf (ref 0–5)

## 2020-05-07 MED ORDER — CIPROFLOXACIN HCL 500 MG PO TABS
500.0000 mg | ORAL_TABLET | Freq: Once | ORAL | Status: AC
  Administered 2020-05-07: 14:00:00 500 mg via ORAL

## 2020-05-07 NOTE — Progress Notes (Signed)
Urological Symptom Review  Patient is experiencing the following symptoms: Frequent urination Get up at night to urinate Leakage of urine Erection problems (male only)   Review of Systems  Gastrointestinal (upper)  : Negative for upper GI symptoms  Gastrointestinal (lower) : Negative for lower GI symptoms  Constitutional : Negative for symptoms  Skin: Negative for skin symptoms  Eyes: Blurred vision  Ear/Nose/Throat : Sinus problems  Hematologic/Lymphatic: Easy bruising  Cardiovascular : Negative for cardiovascular symptoms  Respiratory : Negative for respiratory symptoms  Endocrine: Negative for endocrine symptoms  Musculoskeletal: Back pain Joint pain  Neurological: Negative for neurological symptoms  Psychologic: Negative for psychiatric symptoms

## 2020-05-07 NOTE — Patient Instructions (Signed)
Bladder Cancer  Bladder cancer is an abnormal growth of tissue in the bladder. The bladder is the balloon-like sac in the pelvis. It collects and stores urine that comes from the kidneys through the ureters. The bladder wall is made of layers. If cancer spreads into these layers and through the wall of the bladder, it becomes more difficult to treat. What are the causes? The cause of this condition is not known. What increases the risk? The following factors may make you more likely to develop this condition:  Smoking.  Workplace risks (occupational exposures), such as rubber, leather, textile, dyes, chemicals, and paint.  Being white.  Your age. Most people with bladder cancer are over the age of 55.  Being male.  Having chronic bladder inflammation.  Having a personal history of bladder cancer.  Having a family history of bladder cancer (heredity).  Having had chemotherapy or radiation therapy to the pelvis.  Having been exposed to arsenic. What are the signs or symptoms? Initial symptoms of this condition include:  Blood in the urine.  Painful urination.  Frequent bladder or urine infections.  Increase in urgency and frequency of urination. Advanced symptoms of this condition include:  Not being able to urinate.  Low back pain on one side.  Loss of appetite.  Weight loss.  Fatigue.  Swelling in the feet.  Bone pain. How is this diagnosed? This condition is diagnosed based on your medical history, a physical exam, urine tests, lab tests, imaging tests, and your symptoms. You may also have other tests or procedures done, such as:  A narrow tube being inserted into your bladder through your urethra (cystoscopy) in order to view the lining of your bladder for tumors.  A biopsy to sample the tumor to see if cancer is present. If cancer is present, it will then be staged to determine its severity and extent. Staging is an assessment of:  The size of the  tumor.  Whether the cancer has spread.  Where the cancer has spread. It is important to know how deeply into the bladder wall cancer has grown and whether cancer has spread to any other parts of your body. Staging may require blood tests or imaging tests, such as a CT scan, MRI, bone scan, or chest X-ray. How is this treated? Based on the stage of cancer, one treatment or a combination of treatments may be recommended. The most common forms of treatment are:  Surgery to remove the cancer. Procedures that may be done include transurethral resection and cystectomy.  Radiation therapy. This is high-energy X-rays or other particles. This is often used in combination with chemotherapy.  Chemotherapy. During this treatment, medicines are used to kill cancer cells.  Immunotherapy. This uses medicines to help your own immune system destroy cancer cells. Follow these instructions at home:  Take over-the-counter and prescription medicines only as told by your health care provider.  Maintain a healthy diet. Some of your treatments might affect your appetite.  Consider joining a support group. This may help you learn to cope with the stress of having bladder cancer.  Tell your cancer care team if you develop side effects. They may be able to recommend ways to relieve them.  Keep all follow-up visits as told by your health care provider. This is important. Where to find more information  American Cancer Society: www.cancer.org  National Cancer Institute (NCI): www.cancer.gov Contact a health care provider if:  You have symptoms of a urinary tract infection. These include: ?   Fever. ? Chills. ? Weakness. ? Muscle aches. ? Abdominal pain. ? Frequent and intense urge to urinate. ? Burning feeling in the bladder or urethra during urination. Get help right away if:  There is blood in your urine.  You cannot urinate.  You have severe pain or other symptoms that do not go  away. Summary  Bladder cancer is an abnormal growth of tissue in the bladder.  This condition is diagnosed based on your medical history, a physical exam, urine tests, lab tests, imaging tests, and your symptoms.  Based on the stage of cancer, surgery, chemotherapy, or a combination of treatments may be recommended.  Consider joining a support group. This may help you learn to cope with the stress of having bladder cancer. This information is not intended to replace advice given to you by your health care provider. Make sure you discuss any questions you have with your health care provider. Document Revised: 04/22/2017 Document Reviewed: 04/13/2016 Elsevier Patient Education  2020 Elsevier Inc.  

## 2020-05-07 NOTE — Progress Notes (Signed)
   05/07/20  CC: followup bladder cancer  HPI: [name] is a 69yo here for followup for low grade bladder cancer. NO new LUTS. No hematuria or dysuria.  Blood pressure 103/60, pulse 85, temperature 98.3 F (36.8 C), height 6\' 4"  (1.93 m), weight 192 lb (87.1 kg). NED. A&Ox3.   No respiratory distress   Abd soft, NT, ND Normal phallus with bilateral descended testicles  Cystoscopy Procedure Note  Patient identification was confirmed, informed consent was obtained, and patient was prepped using Betadine solution.  Lidocaine jelly was administered per urethral meatus.     Pre-Procedure: - Inspection reveals a normal caliber ureteral meatus.  Procedure: The flexible cystoscope was introduced without difficulty - No urethral strictures/lesions are present. - Enlarged prostate  - Normal bladder neck - Bilateral ureteral orifices identified - 82mm papillary dome tumor - No bladder stones - No trabeculation     Post-Procedure: - Patient tolerated the procedure well  Assessment/ Plan: We discussed the management of low grade bladder cancer including repeat tumor resection versus surveillance and the patient elects for surveillance.  Return in about 3 months (around 08/05/2020) for cystoscopy.  Nicolette Bang, MD

## 2020-05-12 DIAGNOSIS — F17200 Nicotine dependence, unspecified, uncomplicated: Secondary | ICD-10-CM | POA: Diagnosis not present

## 2020-05-12 DIAGNOSIS — G6189 Other inflammatory polyneuropathies: Secondary | ICD-10-CM | POA: Diagnosis not present

## 2020-05-12 DIAGNOSIS — Z9181 History of falling: Secondary | ICD-10-CM | POA: Diagnosis not present

## 2020-05-12 DIAGNOSIS — E1165 Type 2 diabetes mellitus with hyperglycemia: Secondary | ICD-10-CM | POA: Diagnosis not present

## 2020-05-12 DIAGNOSIS — Z794 Long term (current) use of insulin: Secondary | ICD-10-CM | POA: Diagnosis not present

## 2020-05-22 DIAGNOSIS — M79676 Pain in unspecified toe(s): Secondary | ICD-10-CM | POA: Diagnosis not present

## 2020-05-22 DIAGNOSIS — E1142 Type 2 diabetes mellitus with diabetic polyneuropathy: Secondary | ICD-10-CM | POA: Diagnosis not present

## 2020-05-22 DIAGNOSIS — L84 Corns and callosities: Secondary | ICD-10-CM | POA: Diagnosis not present

## 2020-05-22 DIAGNOSIS — B351 Tinea unguium: Secondary | ICD-10-CM | POA: Diagnosis not present

## 2020-06-04 DIAGNOSIS — I10 Essential (primary) hypertension: Secondary | ICD-10-CM | POA: Diagnosis not present

## 2020-06-04 DIAGNOSIS — C679 Malignant neoplasm of bladder, unspecified: Secondary | ICD-10-CM | POA: Diagnosis not present

## 2020-06-04 DIAGNOSIS — E1165 Type 2 diabetes mellitus with hyperglycemia: Secondary | ICD-10-CM | POA: Diagnosis not present

## 2020-06-04 DIAGNOSIS — G6189 Other inflammatory polyneuropathies: Secondary | ICD-10-CM | POA: Diagnosis not present

## 2020-06-04 DIAGNOSIS — E1159 Type 2 diabetes mellitus with other circulatory complications: Secondary | ICD-10-CM | POA: Diagnosis not present

## 2020-06-04 DIAGNOSIS — E785 Hyperlipidemia, unspecified: Secondary | ICD-10-CM | POA: Diagnosis not present

## 2020-06-04 DIAGNOSIS — Z794 Long term (current) use of insulin: Secondary | ICD-10-CM | POA: Diagnosis not present

## 2020-07-31 DIAGNOSIS — L84 Corns and callosities: Secondary | ICD-10-CM | POA: Diagnosis not present

## 2020-07-31 DIAGNOSIS — E1142 Type 2 diabetes mellitus with diabetic polyneuropathy: Secondary | ICD-10-CM | POA: Diagnosis not present

## 2020-07-31 DIAGNOSIS — M79676 Pain in unspecified toe(s): Secondary | ICD-10-CM | POA: Diagnosis not present

## 2020-07-31 DIAGNOSIS — B351 Tinea unguium: Secondary | ICD-10-CM | POA: Diagnosis not present

## 2020-08-11 DIAGNOSIS — G250 Essential tremor: Secondary | ICD-10-CM | POA: Diagnosis not present

## 2020-08-11 DIAGNOSIS — I25119 Atherosclerotic heart disease of native coronary artery with unspecified angina pectoris: Secondary | ICD-10-CM | POA: Diagnosis not present

## 2020-08-11 DIAGNOSIS — Z794 Long term (current) use of insulin: Secondary | ICD-10-CM | POA: Diagnosis not present

## 2020-08-11 DIAGNOSIS — E1151 Type 2 diabetes mellitus with diabetic peripheral angiopathy without gangrene: Secondary | ICD-10-CM | POA: Diagnosis not present

## 2020-08-11 DIAGNOSIS — M79605 Pain in left leg: Secondary | ICD-10-CM | POA: Diagnosis not present

## 2020-08-11 DIAGNOSIS — M79604 Pain in right leg: Secondary | ICD-10-CM | POA: Diagnosis not present

## 2020-08-11 DIAGNOSIS — G8929 Other chronic pain: Secondary | ICD-10-CM | POA: Diagnosis not present

## 2020-08-11 DIAGNOSIS — I429 Cardiomyopathy, unspecified: Secondary | ICD-10-CM | POA: Diagnosis not present

## 2020-08-11 DIAGNOSIS — Z0283 Encounter for blood-alcohol and blood-drug test: Secondary | ICD-10-CM | POA: Diagnosis not present

## 2020-08-14 DIAGNOSIS — D849 Immunodeficiency, unspecified: Secondary | ICD-10-CM | POA: Diagnosis not present

## 2020-08-14 DIAGNOSIS — Z833 Family history of diabetes mellitus: Secondary | ICD-10-CM | POA: Diagnosis not present

## 2020-08-15 DIAGNOSIS — Z833 Family history of diabetes mellitus: Secondary | ICD-10-CM | POA: Diagnosis not present

## 2020-08-15 DIAGNOSIS — D849 Immunodeficiency, unspecified: Secondary | ICD-10-CM | POA: Diagnosis not present

## 2020-09-10 ENCOUNTER — Other Ambulatory Visit: Payer: Self-pay

## 2020-09-10 ENCOUNTER — Ambulatory Visit (INDEPENDENT_AMBULATORY_CARE_PROVIDER_SITE_OTHER): Payer: Medicare Other | Admitting: Urology

## 2020-09-10 ENCOUNTER — Encounter: Payer: Self-pay | Admitting: Urology

## 2020-09-10 VITALS — BP 113/66 | HR 85 | Temp 98.0°F | Ht 76.0 in | Wt 200.0 lb

## 2020-09-10 DIAGNOSIS — C679 Malignant neoplasm of bladder, unspecified: Secondary | ICD-10-CM

## 2020-09-10 LAB — URINALYSIS, ROUTINE W REFLEX MICROSCOPIC
Bilirubin, UA: NEGATIVE
Ketones, UA: NEGATIVE
Leukocytes,UA: NEGATIVE
Nitrite, UA: NEGATIVE
Protein,UA: NEGATIVE
Specific Gravity, UA: 1.015 (ref 1.005–1.030)
Urobilinogen, Ur: 0.2 mg/dL (ref 0.2–1.0)
pH, UA: 6.5 (ref 5.0–7.5)

## 2020-09-10 MED ORDER — CIPROFLOXACIN HCL 500 MG PO TABS
500.0000 mg | ORAL_TABLET | Freq: Once | ORAL | Status: AC
  Administered 2020-09-10: 500 mg via ORAL

## 2020-09-10 NOTE — Patient Instructions (Addendum)
Bladder Cancer  Bladder cancer is a condition in which abnormal tissue (a tumor) grows in the bladder. The bladder is the organ that holds urine. Two tubes (ureters) carry the urine from the kidneys to the bladder. The bladder wall is made of layers of tissue. Cancer that spreads through these layers of the bladder wall becomes more difficult to treat. What are the causes? The cause of this condition is not known. What increases the risk? The following factors may make you more likely to develop this condition:  Smoking.  Working where there are risks (occupational exposures), such as working with rubber, leather, clothing fabric, dyes, chemicals, and paint.  Being 71 years of age or older.  Being male.  Having bladder inflammation that is long-term (chronic).  Having a history of cancer, including: ? A family history of bladder cancer. ? Personal experience with bladder cancer. ? Having had certain treatments for cancer before. These include:  Medicines to kill cancer cells (chemotherapy).  Strong X-ray beams or capsules high in energy to kill cancer cells and shrink tumors (radiation therapy).  Having been exposed to arsenic. This is a chemical element that can poison you. What are the signs or symptoms? Early symptoms of this condition include:  Seeing blood in your urine.  Feeling pain when urinating.  Having infections of your urinary system (urinary tract infections or UTIs) that happen often.  Having to urinate sooner or more often than usual. Later symptoms of this condition include:  Not being able to urinate.  Pain on one side of your lower back.  Loss of appetite.  Weight loss.  Tiredness (fatigue).  Swelling in your feet.  Bone pain. How is this diagnosed? This condition is diagnosed based on:  Your medical history.  A physical exam.  Lab tests, such as urine tests.  Imaging tests.  Your symptoms. You may also have other tests or  procedures done, such as:  A cystoscopy. A narrow tube is inserted into your bladder through the organ that connects your bladder to the outside of your body (urethra). This is done to view the lining of your bladder for tumors.  A biopsy. This procedure involves removing a tissue sample to look at it under a microscope to see if cancer is present. It is important to find out:  How deeply into the bladder wall cancer has grown.  Whether cancer has spread to any other parts of your body. This may require blood tests or imaging tests, such as a CT scan, MRI, bone scan, or X-rays. How is this treated? Your health care provider may recommend one or more types of treatment based on the stage of your cancer. The most common types of treatment are:  Surgery to remove the cancer. Procedures that may be done include: ? Removing a tumor on the inside wall of the bladder (transurethral resection). ? Removing the bladder (cystectomy).  Radiation therapy. This is often used together with chemotherapy.  Chemotherapy.  Immunotherapy. This uses medicines to help your immune system destroy cancer cells. Follow these instructions at home:  Take over-the-counter and prescription medicines only as told by your health care provider.  Eat a healthy diet. Some of your treatments might affect your appetite.  Do not use any products that contain nicotine or tobacco, such as cigarettes, e-cigarettes, and chewing tobacco. If you need help quitting, ask your health care provider.  Consider joining a support group. This may help you learn to cope with the stress of having   bladder cancer.  Tell your cancer care team if you develop side effects. Your team may be able to recommend ways to get relief.  Keep all follow-up visits as told by your health care provider. This is important. Where to find more information  American Cancer Society: www.cancer.[place] ([place]):  www.cancer.gov Contact a health care provider if:  You have symptoms of a urinary tract infection. These include: ? Fever. ? Chills. ? Weakness. ? Muscle aches. ? Pain in your abdomen. ? Urge to urinate that is stronger and happens more often than usual. ? Burning feeling in the bladder or urethra when you urinate. Get help right away if:  There is blood in your urine.  You cannot urinate.  You have severe pain or other symptoms that do not go away. Summary  Bladder cancer is a condition in which tumors grow in the bladder and cause illness.  This condition is diagnosed based on your medical history, a physical exam, lab tests, imaging tests, and your symptoms.  Your health care provider may recommend one or more types of treatment based on the stage of your cancer.  Consider joining a support group. This may help you learn to cope with the stress of having bladder cancer. This information is not intended to replace advice given to you by your health care provider. Make sure you discuss any questions you have with your health care provider. Document Revised: 01/17/2019 Document Reviewed: 01/17/2019 Elsevier Patient Education  2021 [place].  Bladder Cancer  Bladder cancer is a condition in which abnormal tissue (a tumor) grows in the bladder. The bladder is the organ that holds urine. Two tubes (ureters) carry the urine from the kidneys to the bladder. The bladder wall is made of layers of tissue. Cancer that spreads through these layers of the bladder wall becomes more difficult to treat. What are the causes? The cause of this condition is not known. What increases the risk? The following factors may make you more likely to develop this condition:  Smoking.  Working where there are risks (occupational exposures), such as working with Engineer, structural, Brewing technologist, clothing fabric, dyes, chemicals, and paint.  Being 71 years of age or older.  Being male.  Having bladder  inflammation that is long-term (chronic).  Having a history of cancer, including: ? A family history of bladder cancer. ? Personal experience with bladder cancer. ? Having had certain treatments for cancer before. These include:  Medicines to kill cancer cells (chemotherapy).  Strong X-ray beams or capsules high in energy to kill cancer cells and shrink tumors (radiation therapy).  Having been exposed to arsenic. This is a Financial risk analyst that can poison you. What are the signs or symptoms? Early symptoms of this condition include:  Seeing blood in your urine.  Feeling pain when urinating.  Having infections of your urinary system (urinary tract infections or UTIs) that happen often.  Having to urinate sooner or more often than usual. Later symptoms of this condition include:  Not being able to urinate.  Pain on one side of your lower back.  Loss of appetite.  Weight loss.  Tiredness (fatigue).  Swelling in your feet.  Bone pain. How is this diagnosed? This condition is diagnosed based on:  Your medical history.  A physical exam.  Lab tests, such as urine tests.  Imaging tests.  Your symptoms. You may also have other tests or procedures done, such as:  A cystoscopy. A narrow tube is inserted into  your bladder through the organ that connects your bladder to the outside of your body (urethra). This is done to view the lining of your bladder for tumors.  A biopsy. This procedure involves removing a tissue sample to look at it under a microscope to see if cancer is present. It is important to find out:  How deeply into the bladder wall cancer has grown.  Whether cancer has spread to any other parts of your body. This may require blood tests or imaging tests, such as a CT scan, MRI, bone scan, or X-rays. How is this treated? Your health care provider may recommend one or more types of treatment based on the stage of your cancer. The most common types of  treatment are:  Surgery to remove the cancer. Procedures that may be done include: ? Removing a tumor on the inside wall of the bladder (transurethral resection). ? Removing the bladder (cystectomy).  Radiation therapy. This is often used together with chemotherapy.  Chemotherapy.  Immunotherapy. This uses medicines to help your immune system destroy cancer cells. Follow these instructions at home:  Take over-the-counter and prescription medicines only as told by your health care provider.  Eat a healthy diet. Some of your treatments might affect your appetite.  Do not use any products that contain nicotine or tobacco, such as cigarettes, e-cigarettes, and chewing tobacco. If you need help quitting, ask your health care provider.  Consider joining a support group. This may help you learn to cope with the stress of having bladder cancer.  Tell your cancer care team if you develop side effects. Your team may be able to recommend ways to get relief.  Keep all follow-up visits as told by your health care provider. This is important. Where to find more information  American Cancer Society: www.cancer.[place] ([place] www.cancer.gov Contact a health care provider if:  You have symptoms of a urinary tract infection. These include: ? Fever. ? Chills. ? Weakness. ? Muscle aches. ? Pain in your abdomen. ? Urge to urinate that is stronger and happens more often than usual. ? Burning feeling in the bladder or urethra when you urinate. Get help right away if:  There is blood in your urine.  You cannot urinate.  You have severe pain or other symptoms that do not go away. Summary  Bladder cancer is a condition in which tumors grow in the bladder and cause illness.  This condition is diagnosed based on your medical history, a physical exam, lab tests, imaging tests, and your symptoms.  Your health care provider may recommend one or more types of treatment  based on the stage of your cancer.  Consider joining a support group. This may help you learn to cope with the stress of having bladder cancer. This information is not intended to replace advice given to you by your health care provider. Make sure you discuss any questions you have with your health care provider. Document Revised: 01/17/2019 Document Reviewed: 01/17/2019 Elsevier Patient Education  [place].

## 2020-09-10 NOTE — Progress Notes (Signed)
Urological Symptom Review  Patient is experiencing the following symptoms: Frequent urination Get up at night to urinate Leakage of urine Stream starts and stops   Review of Systems  Gastrointestinal (upper)  : Negative for upper GI symptoms  Gastrointestinal (lower) : negative Constitutional : Negative for symptoms  Skin: Negative for skin symptoms  Eyes: Negative for eye symptoms  Ear/Nose/Throat : Negative for Ear/Nose/Throat symptoms  Hematologic/Lymphatic: Negative for Hematologic/Lymphatic symptoms  Cardiovascular : Negative for cardiovascular symptoms  Respiratory : Negative for respiratory symptoms  Endocrine: Negative for endocrine symptoms  Musculoskeletal: Negative for musculoskeletal symptoms  Neurological: Negative for neurological symptoms  Psychologic: Negative for psychiatric symptoms

## 2020-09-10 NOTE — Progress Notes (Signed)
   09/10/20  CC: followup bladder cancer  HPI: Andrew [name] is a 70yo here for followup for bladder cancer.  Blood pressure 113/66, pulse 85, temperature 98 F (36.7 C), height 6\' 4"  (1.93 m), weight 200 lb (90.7 kg). NED. A&Ox3.   No respiratory distress   Abd soft, NT, ND Normal phallus with bilateral descended testicles  Cystoscopy Procedure Note  Patient identification was confirmed, informed consent was obtained, and patient was prepped using Betadine solution.  Lidocaine jelly was administered per urethral meatus.     Pre-Procedure: - Inspection reveals a normal caliber ureteral meatus.  Procedure: The flexible cystoscope was introduced without difficulty - No urethral strictures/lesions are present. - Normal prostate  - Normal bladder neck - Bilateral ureteral orifices identified - Bladder mucosa  reveals 24mm papillary dome tumor - No bladder stones - No trabeculation   Post-Procedure: - Patient tolerated the procedure well  Assessment/ Plan: We discussed the management of his bladder tumor including surveillance versus transurethral resection and the patient wishes to pursue surveillance   Return in about 3 months (around 12/10/2020) for cystoscopy.  Nicolette Bang, MD

## 2020-10-09 DIAGNOSIS — B351 Tinea unguium: Secondary | ICD-10-CM | POA: Diagnosis not present

## 2020-10-09 DIAGNOSIS — L84 Corns and callosities: Secondary | ICD-10-CM | POA: Diagnosis not present

## 2020-10-09 DIAGNOSIS — M79676 Pain in unspecified toe(s): Secondary | ICD-10-CM | POA: Diagnosis not present

## 2020-10-09 DIAGNOSIS — E1142 Type 2 diabetes mellitus with diabetic polyneuropathy: Secondary | ICD-10-CM | POA: Diagnosis not present

## 2020-11-14 DIAGNOSIS — G250 Essential tremor: Secondary | ICD-10-CM | POA: Diagnosis not present

## 2020-11-17 DIAGNOSIS — Z961 Presence of intraocular lens: Secondary | ICD-10-CM | POA: Diagnosis not present

## 2020-11-17 DIAGNOSIS — H353131 Nonexudative age-related macular degeneration, bilateral, early dry stage: Secondary | ICD-10-CM | POA: Diagnosis not present

## 2020-11-17 DIAGNOSIS — E119 Type 2 diabetes mellitus without complications: Secondary | ICD-10-CM | POA: Diagnosis not present

## 2020-11-19 ENCOUNTER — Telehealth: Payer: Self-pay

## 2020-11-19 DIAGNOSIS — M25561 Pain in right knee: Secondary | ICD-10-CM | POA: Diagnosis not present

## 2020-11-19 DIAGNOSIS — K635 Polyp of colon: Secondary | ICD-10-CM | POA: Diagnosis not present

## 2020-11-19 DIAGNOSIS — M25562 Pain in left knee: Secondary | ICD-10-CM | POA: Diagnosis not present

## 2020-11-19 DIAGNOSIS — R739 Hyperglycemia, unspecified: Secondary | ICD-10-CM | POA: Diagnosis not present

## 2020-11-19 DIAGNOSIS — G250 Essential tremor: Secondary | ICD-10-CM | POA: Diagnosis not present

## 2020-11-19 DIAGNOSIS — E1165 Type 2 diabetes mellitus with hyperglycemia: Secondary | ICD-10-CM | POA: Diagnosis not present

## 2020-11-19 DIAGNOSIS — I25119 Atherosclerotic heart disease of native coronary artery with unspecified angina pectoris: Secondary | ICD-10-CM | POA: Diagnosis not present

## 2020-11-19 DIAGNOSIS — G8929 Other chronic pain: Secondary | ICD-10-CM | POA: Diagnosis not present

## 2020-11-19 DIAGNOSIS — I10 Essential (primary) hypertension: Secondary | ICD-10-CM | POA: Diagnosis not present

## 2020-11-19 NOTE — Telephone Encounter (Signed)
Patient's urine has a bad odor and cloud like things floating in urine.  Please advise.  Thanks, Helene Kelp

## 2020-11-19 NOTE — Telephone Encounter (Signed)
Left message for patient that he may come leave urine specimen and to return office call

## 2020-11-21 DIAGNOSIS — M25561 Pain in right knee: Secondary | ICD-10-CM | POA: Diagnosis not present

## 2020-11-21 DIAGNOSIS — M25562 Pain in left knee: Secondary | ICD-10-CM | POA: Diagnosis not present

## 2020-11-21 DIAGNOSIS — M170 Bilateral primary osteoarthritis of knee: Secondary | ICD-10-CM | POA: Diagnosis not present

## 2020-12-01 DIAGNOSIS — E785 Hyperlipidemia, unspecified: Secondary | ICD-10-CM | POA: Diagnosis not present

## 2020-12-01 DIAGNOSIS — Z955 Presence of coronary angioplasty implant and graft: Secondary | ICD-10-CM | POA: Diagnosis not present

## 2020-12-01 DIAGNOSIS — I10 Essential (primary) hypertension: Secondary | ICD-10-CM | POA: Diagnosis not present

## 2020-12-01 DIAGNOSIS — I252 Old myocardial infarction: Secondary | ICD-10-CM | POA: Diagnosis not present

## 2020-12-01 DIAGNOSIS — I25119 Atherosclerotic heart disease of native coronary artery with unspecified angina pectoris: Secondary | ICD-10-CM | POA: Diagnosis not present

## 2020-12-10 ENCOUNTER — Other Ambulatory Visit: Payer: Self-pay

## 2020-12-10 ENCOUNTER — Ambulatory Visit (INDEPENDENT_AMBULATORY_CARE_PROVIDER_SITE_OTHER): Payer: Medicare Other | Admitting: Urology

## 2020-12-10 ENCOUNTER — Encounter: Payer: Self-pay | Admitting: Urology

## 2020-12-10 VITALS — BP 115/65 | HR 88 | Temp 98.2°F | Wt 194.2 lb

## 2020-12-10 DIAGNOSIS — C679 Malignant neoplasm of bladder, unspecified: Secondary | ICD-10-CM | POA: Diagnosis not present

## 2020-12-10 LAB — URINALYSIS, ROUTINE W REFLEX MICROSCOPIC
Bilirubin, UA: NEGATIVE
Ketones, UA: NEGATIVE
Leukocytes,UA: NEGATIVE
Nitrite, UA: NEGATIVE
Protein,UA: NEGATIVE
Specific Gravity, UA: 1.01 (ref 1.005–1.030)
Urobilinogen, Ur: 0.2 mg/dL (ref 0.2–1.0)
pH, UA: 5.5 (ref 5.0–7.5)

## 2020-12-10 LAB — MICROSCOPIC EXAMINATION
Bacteria, UA: NONE SEEN
Renal Epithel, UA: NONE SEEN /hpf
WBC, UA: NONE SEEN /hpf (ref 0–5)

## 2020-12-10 MED ORDER — CIPROFLOXACIN HCL 500 MG PO TABS
500.0000 mg | ORAL_TABLET | Freq: Once | ORAL | Status: AC
  Administered 2020-12-10: 500 mg via ORAL

## 2020-12-10 NOTE — Progress Notes (Signed)
   12/10/20  CC:  Chief Complaint  Patient presents with   Follow-up    Cysto    HPI: Mr Wanat is a 70yo here for followup for bladder cancer Blood pressure 115/65, pulse 88, temperature 98.2 F (36.8 C), temperature source Oral, weight 194 lb 3.2 oz (88.1 kg). NED. A&Ox3.   No respiratory distress   Abd soft, NT, ND Normal phallus with bilateral descended testicles  Cystoscopy Procedure Note  Patient identification was confirmed, informed consent was obtained, and patient was prepped using Betadine solution.  Lidocaine jelly was administered per urethral meatus.     Pre-Procedure: - Inspection reveals a normal caliber ureteral meatus.  Procedure: The flexible cystoscope was introduced without difficulty - No urethral strictures/lesions are present. - Enlarged prostate  - Normal bladder neck - Bilateral ureteral orifices identified - 1cm papillary dome tumor - No bladder stones - No trabeculation     Post-Procedure: - Patient tolerated the procedure well  Assessment/ Plan: We discussed the management of bladder tumors including observation versus transurethral resection. After discussing the treatment options the patient elects for transurethral resection of the bladder tumor. Risks/benefits/alternatives discussed  No follow-ups on file.  Nicolette Bang, MD

## 2020-12-13 IMAGING — CT CT ANGIO CHEST-ABD-PELV FOR DISSECTION W/ AND WO/W CM
2 of 8 series · 14 of 46 positions shown, 16 images · IV contrast (omnipaque)
Comparison: Chest x-ray from earlier in the same day.

CLINICAL DATA: Chest pain for 1 week

EXAM:
CT ANGIOGRAPHY CHEST, ABDOMEN AND PELVIS
TECHNIQUE: Multidetector CT imaging through the chest, abdomen and pelvis was
performed using the standard protocol during bolus administration of
intravenous contrast. Multiplanar reconstructed images and MIPs were
obtained and reviewed to evaluate the vascular anatomy.
CONTRAST:  100mL OMNIPAQUE 350

[Series 6: arterial · axial · arterial · 0.75mm/px · z∈[+678,+1304]mm · 11 of 355 slices shown, 13 images]
[im 21/355  soft-tissue]
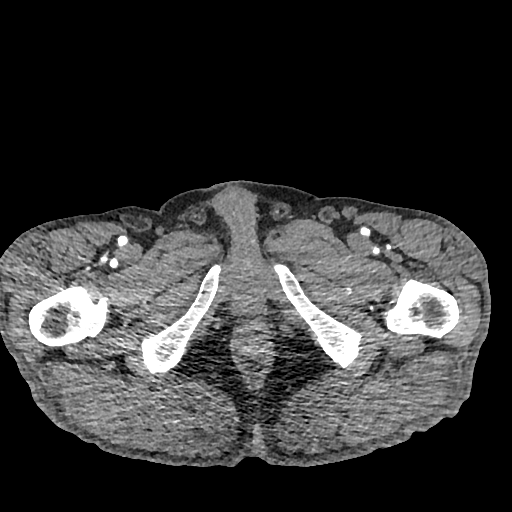
[im 21/355  bone]
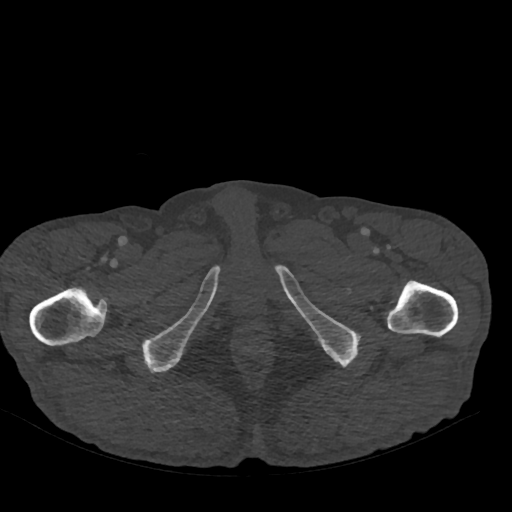
[im 63/355  soft-tissue]
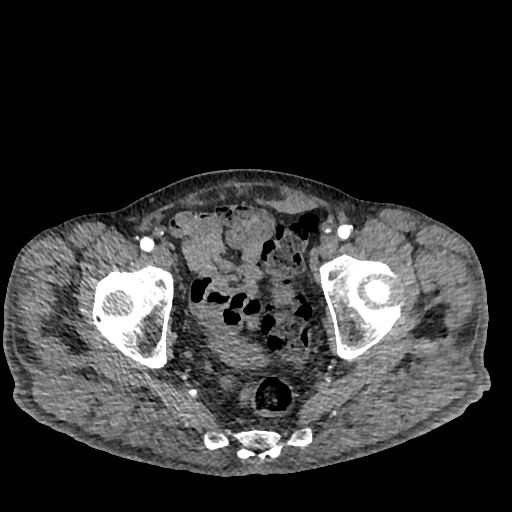
[im 84/355  soft-tissue]
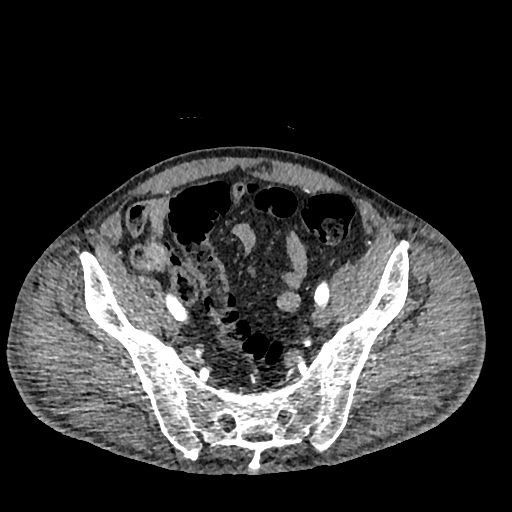
[im 125/355  soft-tissue]
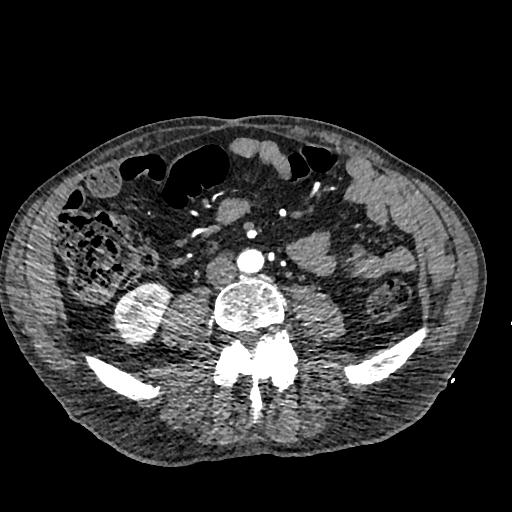
[im 146/355  soft-tissue]
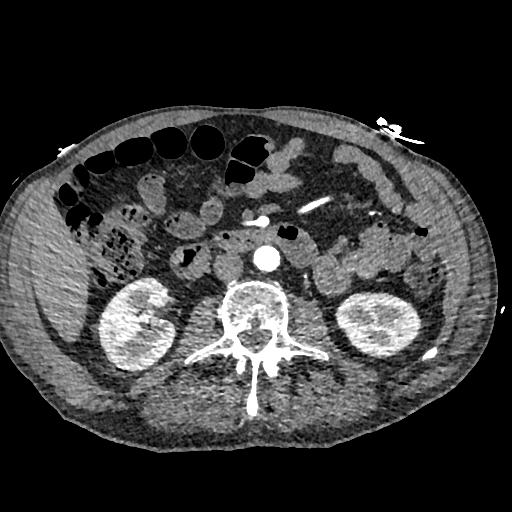
[im 188/355  soft-tissue]
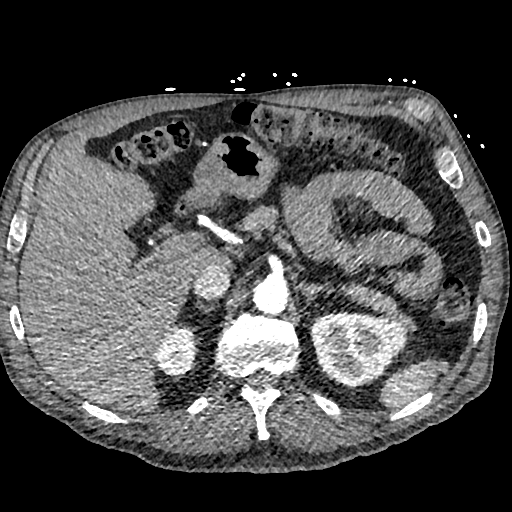
[im 209/355  soft-tissue]
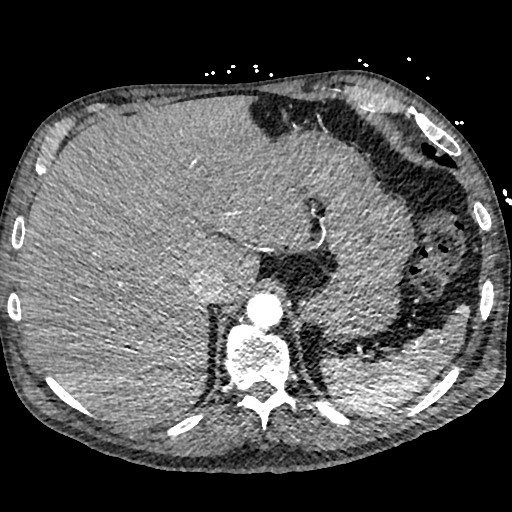
[im 230/355  soft-tissue]
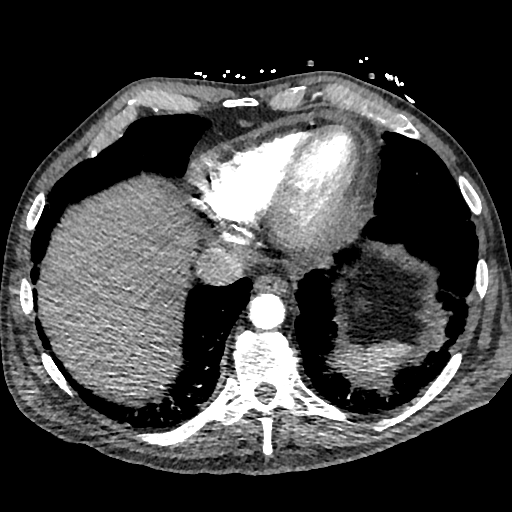
[im 271/355  soft-tissue]
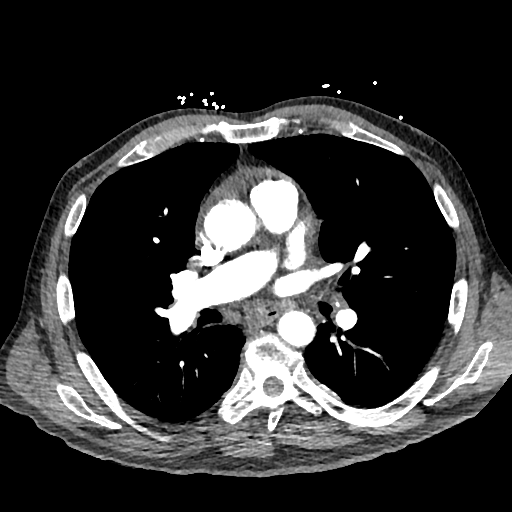
[im 271/355  bone]
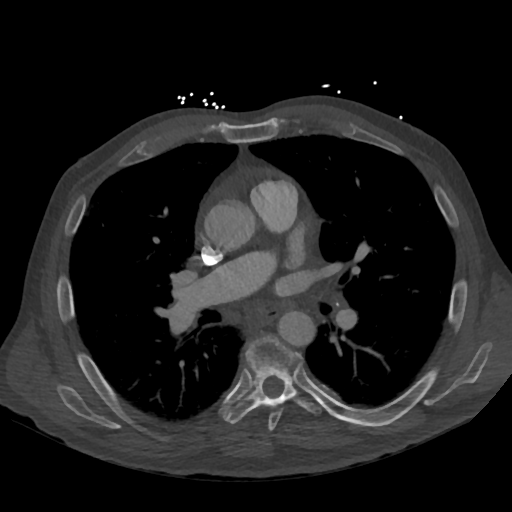
[im 292/355  soft-tissue]
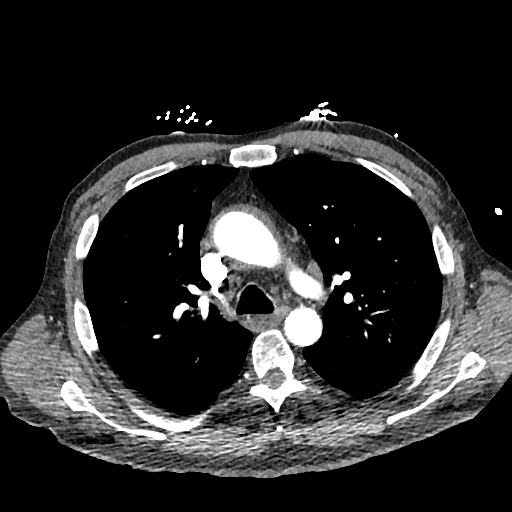
[im 334/355  soft-tissue]
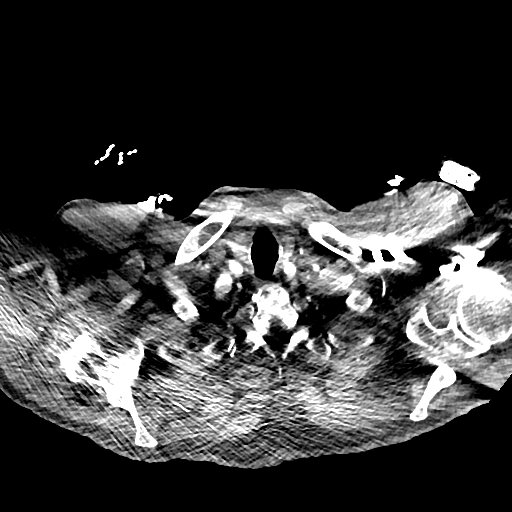

[Series 9: cor · coronal · 0.94mm/px · 3 of 161 slices shown]
[im 41/161  soft-tissue]
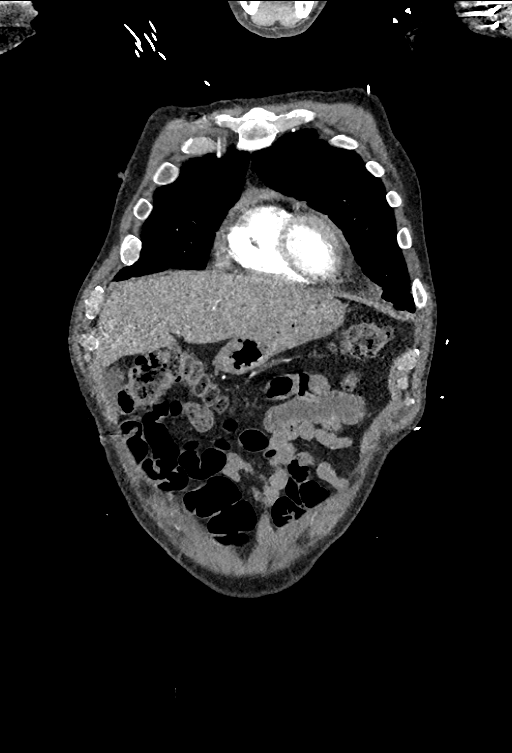
[im 81/161  soft-tissue]
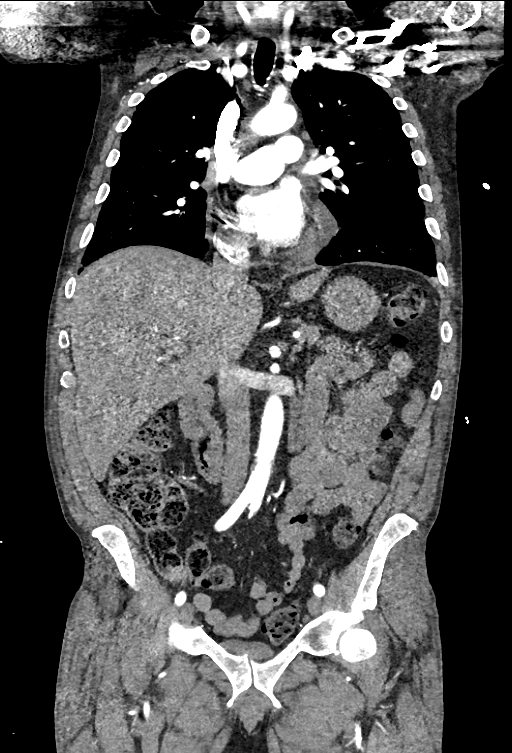
[im 121/161  soft-tissue]
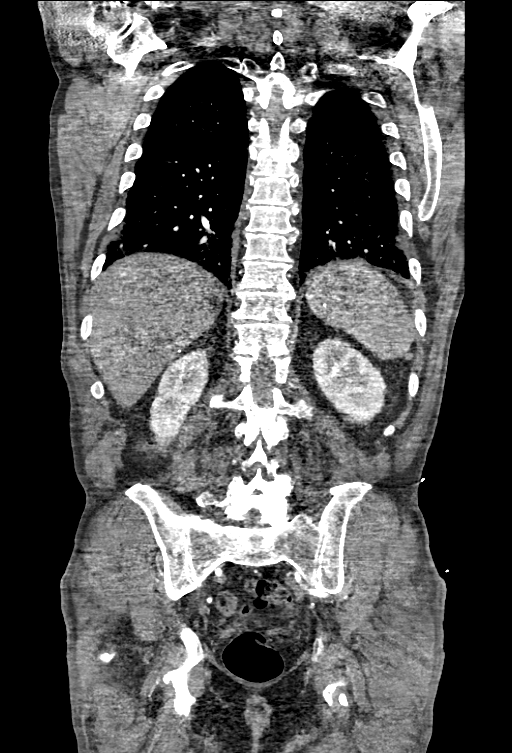

[14 of 46 positions shown; findings below may reference images not displayed]

FINDINGS: CTA CHEST FINDINGS

Cardiovascular: Thoracic aorta demonstrates mild atherosclerotic
calcifications without evidence of aneurysmal dilatation or
dissection. No cardiac enlargement is seen. Mild coronary
calcifications are noted. Pulmonary artery is well visualized
without evidence of pulmonary embolism. Minimal pericardial effusion
is noted. This is new from the prior exam.

Mediastinum/Nodes: Thoracic inlet is within normal limits. Scattered
mediastinal lymph nodes are noted similar to that seen on prior CT.
No significant adenopathy is noted. Multiple calcified hilar nodes
are noted consistent with prior granulomatous disease. The esophagus
is within normal limits.

Lungs/Pleura: Mild emphysematous changes are noted. Mild bibasilar
atelectatic changes are noted. Calcified pleural plaques are seen.
No focal confluent infiltrate is seen.

Musculoskeletal: Degenerative changes of the thoracic spine are
seen. No acute rib abnormality is noted.

Review of the MIP images confirms the above findings.

CTA ABDOMEN AND PELVIS FINDINGS

VASCULAR

Aorta: The abdominal aorta is within normal limits. Normal branching
pattern is noted. Mild aortic calcifications are seen without
aneurysmal dilatation.

Celiac: Patent without evidence of aneurysm, dissection, vasculitis
or significant stenosis.

SMA: Patent without evidence of aneurysm, dissection, vasculitis or
significant stenosis.

Renals: Dual renal arteries are noted on the left. Single renal
artery on the right. No focal stenosis is noted.

IMA: Patent without evidence of aneurysm, dissection, vasculitis or
significant stenosis.

Iliacs: Mild atherosclerotic changes are noted without aneurysmal
dilatation.

Veins: No venous abnormality is noted.

Review of the MIP images confirms the above findings.

NON-VASCULAR

Hepatobiliary: No focal liver abnormality is seen. No gallstones,
gallbladder wall thickening, or biliary dilatation.

Pancreas: Unremarkable. No pancreatic ductal dilatation or
surrounding inflammatory changes.

Spleen: Normal in size without focal abnormality.

Adrenals/Urinary Tract: Adrenal glands are within normal limits.
Kidneys are well visualized bilaterally without renal calculi or
obstructive changes. The bladder is decompressed.

Stomach/Bowel: Scattered diverticular change of the colon is noted
without diverticulitis. No obstructive or inflammatory changes are
seen. The appendix has been surgically removed. No gastric
abnormality is seen.

Lymphatic: No significant lymphadenopathy is noted.

Reproductive: Prostate is unremarkable.

Other: No abdominal wall hernia or abnormality. No abdominopelvic
ascites.

Musculoskeletal: Degenerative changes of the lumbar spine are noted.

Review of the MIP images confirms the above findings.
IMPRESSION: Atherosclerotic changes of the aorta without aneurysmal dilatation
or dissection.

Emphysematous changes and prior granulomatous changes.

Diverticulosis without diverticulitis.

Small pericardial effusion

No other focal abnormality is noted.

Aortic Atherosclerosis (XRUZ5-NGO.O) and Emphysema ([id number].U).

## 2020-12-18 DIAGNOSIS — L84 Corns and callosities: Secondary | ICD-10-CM | POA: Diagnosis not present

## 2020-12-18 DIAGNOSIS — B351 Tinea unguium: Secondary | ICD-10-CM | POA: Diagnosis not present

## 2020-12-18 DIAGNOSIS — E1142 Type 2 diabetes mellitus with diabetic polyneuropathy: Secondary | ICD-10-CM | POA: Diagnosis not present

## 2020-12-18 DIAGNOSIS — M79676 Pain in unspecified toe(s): Secondary | ICD-10-CM | POA: Diagnosis not present

## 2020-12-21 ENCOUNTER — Encounter: Payer: Self-pay | Admitting: Urology

## 2020-12-21 NOTE — Patient Instructions (Signed)
Bladder Cancer Bladder cancer is a condition in which abnormal tissue (a tumor) grows in the bladder. The bladder is the organ that holds urine. Two tubes (ureters) carry the urine from the kidneys to the bladder. The bladder wall is made of layers of tissue. Cancer that spreads through these layers of the bladder wall becomes more difficult to treat. What are the causes? The cause of this condition is not known. What increases the risk? The following factors may make you more likely to develop this condition: Smoking. Working where there are risks (occupational exposures), such as working with rubber, leather, clothing fabric, dyes, chemicals, and paint. Being 55 years of age or older. Being male. Having bladder inflammation that is long-term (chronic). Having a history of cancer, including: A family history of bladder cancer. Personal experience with bladder cancer. Having had certain treatments for cancer before. These include: Medicines to kill cancer cells (chemotherapy). Strong X-ray beams or capsules high in energy to kill cancer cells and shrink tumors (radiation therapy). Having been exposed to arsenic. This is a chemical element that can poison you. What are the signs or symptoms? Early symptoms of this condition include: Seeing blood in your urine. Feeling pain when urinating. Having infections of your urinary system (urinary tract infections or UTIs) that happen often. Having to urinate sooner or more often than usual. Later symptoms of this condition include: Not being able to urinate. Pain on one side of your lower back. Loss of appetite. Weight loss. Tiredness (fatigue). Swelling in your feet. Bone pain. How is this diagnosed? This condition is diagnosed based on: Your medical history. A physical exam. Lab tests, such as urine tests. Imaging tests. Your symptoms. You may also have other tests or procedures done, such as: A cystoscopy. A narrow tube is inserted  into your bladder through the organ that connects your bladder to the outside of your body (urethra). This is done to view the lining of your bladder for tumors. A biopsy. This procedure involves removing a tissue sample to look at it under a microscope to see if cancer is present. It is important to find out: How deeply into the bladder wall cancer has grown. Whether cancer has spread to any other parts of your body. This may require blood tests or imaging tests, such as a CT scan, MRI, bone scan, or X-rays. How is this treated? Your health care provider may recommend one or more types of treatment based on the stage of your cancer. The most common types of treatment are: Surgery to remove the cancer. Procedures that may be done include: Removing a tumor on the inside wall of the bladder (transurethral resection). Removing the bladder (cystectomy). Radiation therapy. This is often used together with chemotherapy. Chemotherapy. Immunotherapy. This uses medicines to help your immune system destroy cancer cells. Follow these instructions at home: Take over-the-counter and prescription medicines only as told by your health care provider. Eat a healthy diet. Some of your treatments might affect your appetite. Do not use any products that contain nicotine or tobacco, such as cigarettes, e-cigarettes, and chewing tobacco. If you need help quitting, ask your health care provider. Consider joining a support group. This may help you learn to cope with the stress of having bladder cancer. Tell your cancer care team if you develop side effects. Your team may be able to recommend ways to get relief. Keep all follow-up visits as told by your health care provider. This is important. Where to find more information American   Cancer Society: www.cancer.org National Cancer Institute (NCI): www.cancer.gov Contact a health care provider if: You have symptoms of a urinary tract infection. These  include: Fever. Chills. Weakness. Muscle aches. Pain in your abdomen. Urge to urinate that is stronger and happens more often than usual. Burning feeling in the bladder or urethra when you urinate. Get help right away if: There is blood in your urine. You cannot urinate. You have severe pain or other symptoms that do not go away. Summary Bladder cancer is a condition in which tumors grow in the bladder and cause illness. This condition is diagnosed based on your medical history, a physical exam, lab tests, imaging tests, and your symptoms. Your health care provider may recommend one or more types of treatment based on the stage of your cancer. Consider joining a support group. This may help you learn to cope with the stress of having bladder cancer. This information is not intended to replace advice given to you by your health care provider. Make sure you discuss any questions you have with your health care provider. Document Revised: 01/17/2019 Document Reviewed: 01/17/2019 [employer] Patient Education  2022 [employer]  

## 2021-01-29 ENCOUNTER — Encounter: Payer: Self-pay | Admitting: Gastroenterology

## 2021-01-31 IMAGING — DX PORTABLE CHEST - 1 VIEW
2 series · 2 of 2 positions shown · non-contrast
Comparison: CT chest from yesterday. Chest x-ray dated September 03, 2018.

CLINICAL DATA: New onset cough.

EXAM:
PORTABLE CHEST 1 VIEW

[chest ap (1 of 2)]
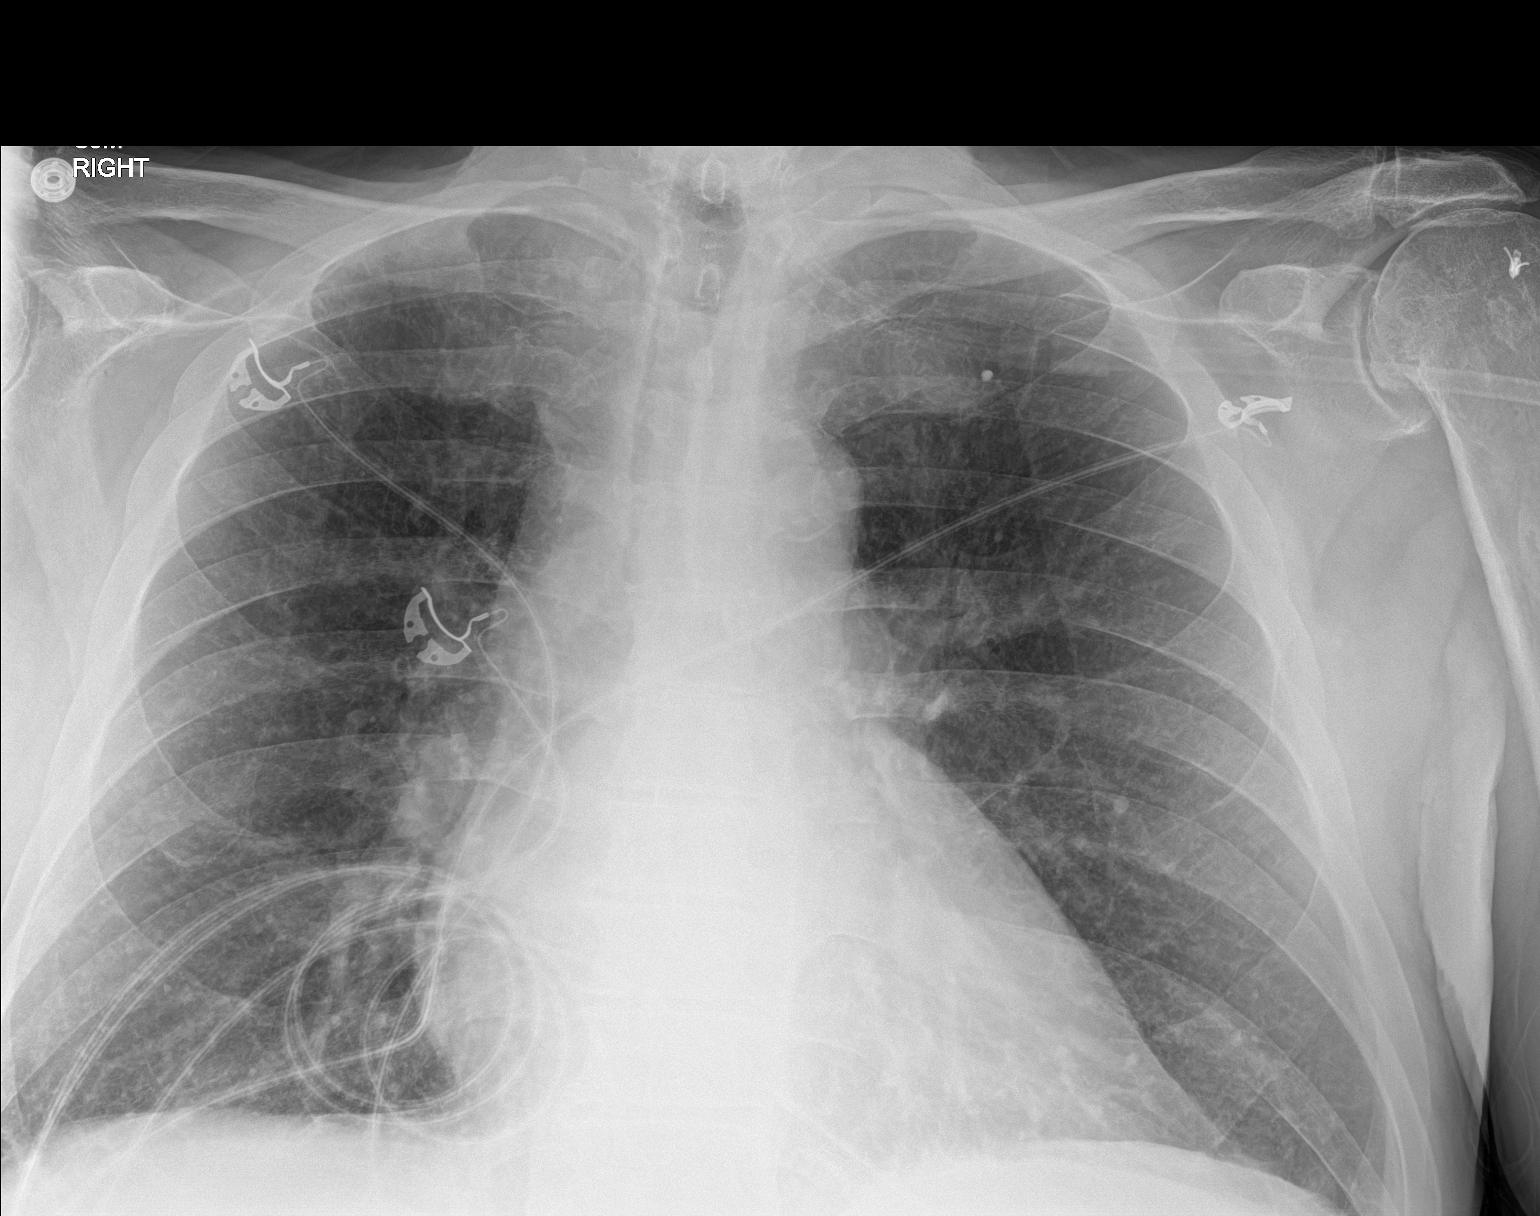

[chest ap (2 of 2)]
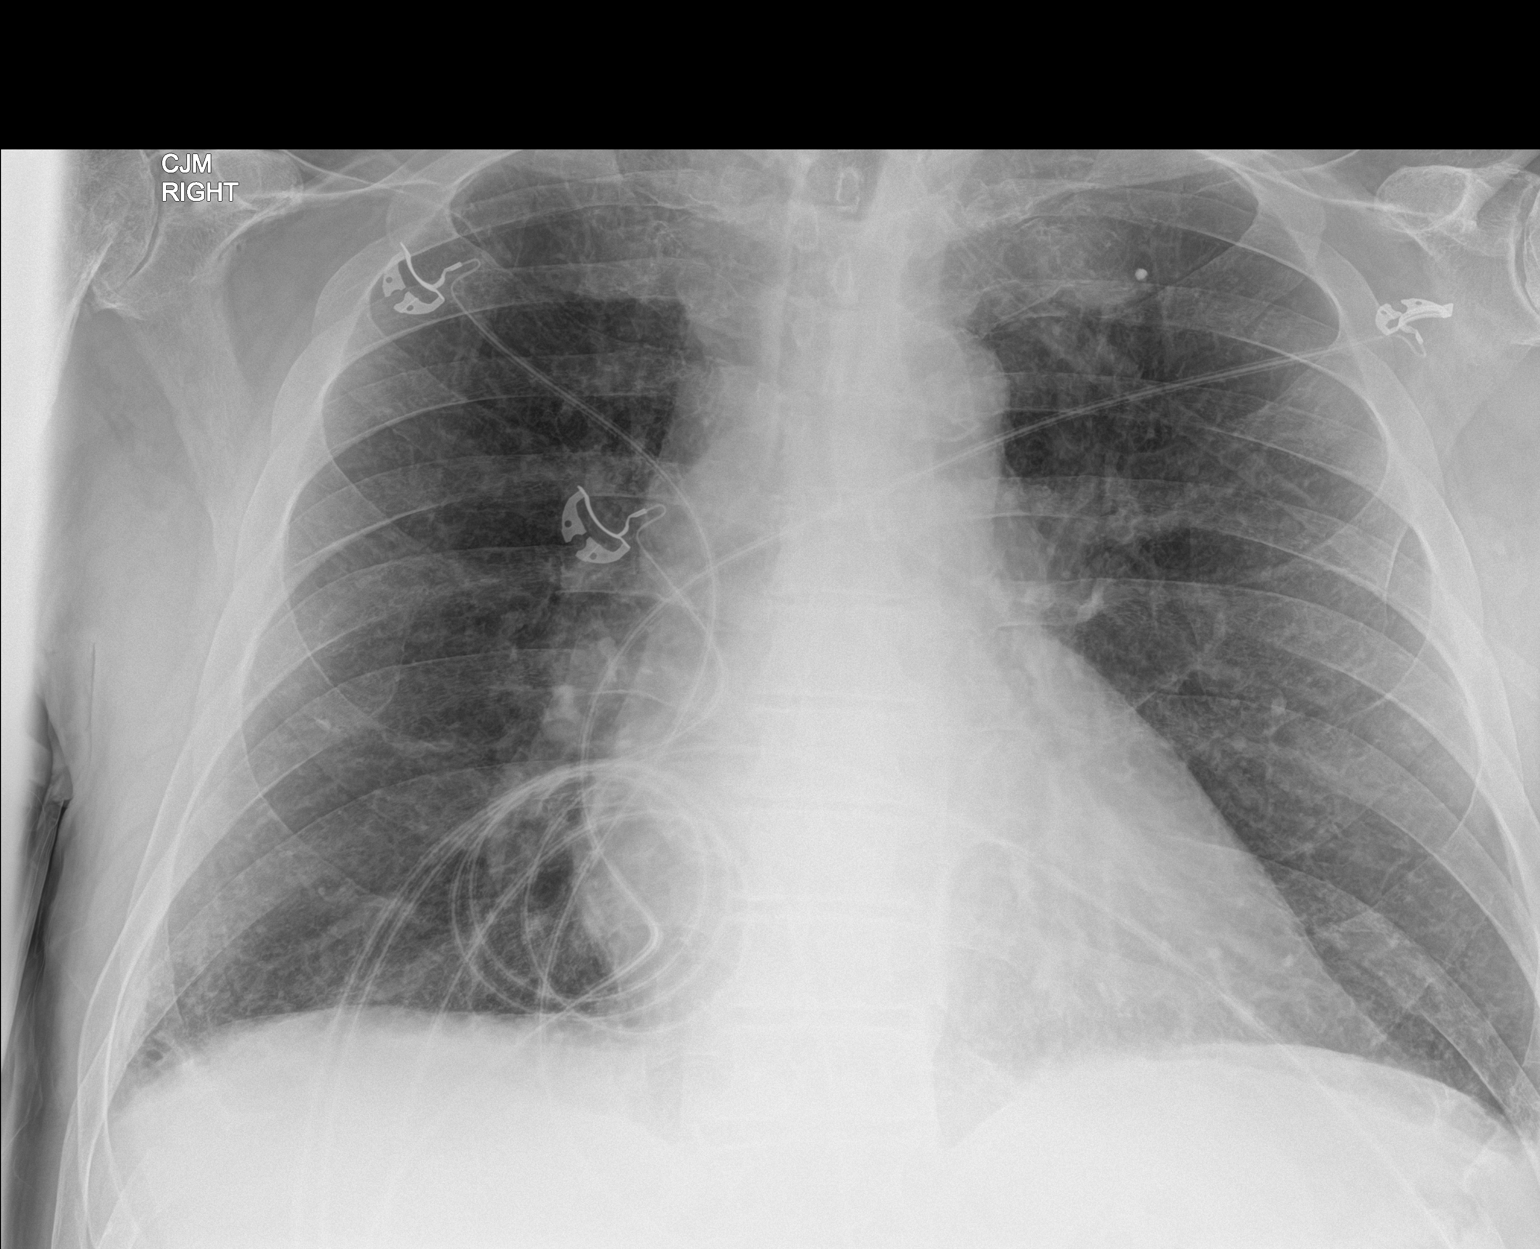

[2 of 2 positions shown; findings below may reference images not displayed]

FINDINGS: The heart size and mediastinal contours are within normal limits.
Normal pulmonary vascularity. Atherosclerotic calcification of the
aortic arch. Chronic, mild interstitial prominence is similar to
prior studies and likely smoking-related. No focal consolidation,
pleural effusion, or pneumothorax. Unchanged punctate calcified
granuloma in the left upper lobe. No acute osseous abnormality.
IMPRESSION: No active disease.

## 2021-02-03 NOTE — Patient Instructions (Signed)
Andrew [name]  02/03/2021     '@PREFPERIOPPHARMACY'$ @   Your procedure is scheduled on  02/09/2021.   Report to [facility] at  1245  P.M.   Call this number if you have problems the morning of surgery:  [phone]   Remember:  Do not eat or drink after midnight.        DO NOT take any medications for diabetes the morning of your procedure.    Take these medicines the morning of surgery with A SIP OF WATER          gabapentin, hydrocodone or oxycodone (if needed), protonix, mysoline, flomax.     Do not wear jewelry, make-up or nail polish.  Do not wear lotions, powders, or perfumes, or deodorant.  Do not shave 48 hours prior to surgery.  Men may shave face and neck.  Do not bring valuables to the hospital.  [facility] is not responsible for any belongings or valuables.  Contacts, dentures or bridgework may not be worn into surgery.  Leave your suitcase in the car.  After surgery it may be brought to your room.  For patients admitted to the hospital, discharge time will be determined by your treatment team.  Patients discharged the day of surgery will not be allowed to drive home and must have someone with them for 24 hours.    Special instructions:   DO NOT smoke tobacco or vape for 24 hours before your procedure.  Please read over the following fact sheets that you were given. Coughing and Deep Breathing, Surgical Site Infection Prevention, Anesthesia Post-op Instructions, and Care and Recovery After Surgery      Transurethral Resection of Bladder Tumor, Care After This sheet gives you information about how to care for yourself after your procedure. Your health care provider may also give you more specific instructions. If you have problems or questions, contact your health care provider. What can I expect after the procedure? After the procedure, it is common to have: A small amount of blood in your urine for up to 2 weeks. Soreness or mild pain from  your catheter. After your catheter is removed, you may have mild soreness, especially when urinating. Pain in your lower abdomen. Follow these instructions at home: Medicines  Take over-the-counter and prescription medicines only as told by your health care provider. If you were prescribed an antibiotic medicine, take it as told by your health care provider. Do not stop taking the antibiotic even if you start to feel better. Do not drive for 24 hours if you were given a sedative during your procedure. Ask your health care provider if the medicine prescribed to you: Requires you to avoid driving or using heavy machinery. Can cause constipation. You may need to take these actions to prevent or treat constipation: Take over-the-counter or prescription medicines. Eat foods that are high in fiber, such as beans, whole grains, and fresh fruits and vegetables. Limit foods that are high in fat and processed sugars, such as fried or sweet foods. Activity Return to your normal activities as told by your health care provider. Ask your health care provider what activities are safe for you. Do not lift anything that is heavier than 10 lb (4.5 kg), or the limit that you are told, until your health care provider says that it is safe. Avoid intense physical activity for as long as told by your health care provider. Rest as told by your health care  provider. Avoid sitting for a long time without moving. Get up to take short walks every 1-2 hours. This is important to improve blood flow and breathing. Ask for help if you feel weak or unsteady. General instructions  Do not drink alcohol for as long as told by your health care provider. This is especially important if you are taking prescription pain medicines. Do not take baths, swim, or use a hot tub until your health care provider approves. Ask your health care provider if you may take showers. You may only be allowed to take sponge baths. If you have a  catheter, follow instructions from your health care provider about caring for your catheter and your drainage bag. Drink enough fluid to keep your urine pale yellow. Wear compression stockings as told by your health care provider. These stockings help to prevent blood clots and reduce swelling in your legs. Keep all follow-up visits as told by your health care provider. This is important. You will need to be followed closely with regular checks of your bladder and urethra (cystoscopies) to make sure that the cancer does not come back. Contact a health care provider if: You have pain that gets worse or does not improve with medicine. You have blood in your urine for more than 2 weeks. You have cloudy or bad-smelling urine. You become constipated. Signs of constipation may include having: Fewer than three bowel movements in a week. Difficulty having a bowel movement. Stools that are dry, hard, or larger than normal. You have a fever. Get help right away if: You have: Severe pain. Bright red blood in your urine. Blood clots in your urine. A lot of blood in your urine. Your catheter has been removed and you are not able to urinate. You have a catheter in place and the catheter is not draining urine. Summary After your procedure, it is common to have a small amount of blood in your urine, soreness or mild pain from your catheter, and pain in your lower abdomen. Take over-the-counter and prescription medicines only as told by your health care provider. Rest as told by your health care provider. Follow your health care provider's instructions about returning to normal activities. Ask what activities are safe for you. If you have a catheter, follow instructions from your health care provider about caring for your catheter and your drainage bag. Get help right away if you cannot urinate, you have severe pain, or you have bright red blood or blood clots in your urine. This information is not  intended to replace advice given to you by your health care provider. Make sure you discuss any questions you have with your health care provider. Document Revised: 12/08/2017 Document Reviewed: 12/08/2017 Elsevier Patient Education  [place] Anesthesia, Adult, Care After This sheet gives you information about how to care for yourself after your procedure. Your health care provider may also give you more specific instructions. If you have problems or questions, contact your health care provider. What can I expect after the procedure? After the procedure, the following side effects are common: Pain or discomfort at the IV site. Nausea. Vomiting. Sore throat. Trouble concentrating. Feeling cold or chills. Feeling weak or tired. Sleepiness and fatigue. Soreness and body aches. These side effects can affect parts of the body that were not involved in surgery. Follow these instructions at home: For the time period you were told by your health care provider:  Rest. Do not participate in activities where you could fall  or become injured. Do not drive or use machinery. Do not drink alcohol. Do not take sleeping pills or medicines that cause drowsiness. Do not make important decisions or sign legal documents. Do not take care of children on your own. Eating and drinking Follow any instructions from your health care provider about eating or drinking restrictions. When you feel hungry, start by eating small amounts of foods that are soft and easy to digest (bland), such as toast. Gradually return to your regular diet. Drink enough fluid to keep your urine pale yellow. If you vomit, rehydrate by drinking water, juice, or clear broth. General instructions If you have sleep apnea, surgery and certain medicines can increase your risk for breathing problems. Follow instructions from your health care provider about wearing your sleep device: Anytime you are sleeping, including  during daytime naps. While taking prescription pain medicines, sleeping medicines, or medicines that make you drowsy. Have a responsible adult stay with you for the time you are told. It is important to have someone help care for you until you are awake and alert. Return to your normal activities as told by your health care provider. Ask your health care provider what activities are safe for you. Take over-the-counter and prescription medicines only as told by your health care provider. If you smoke, do not smoke without supervision. Keep all follow-up visits as told by your health care provider. This is important. Contact a health care provider if: You have nausea or vomiting that does not get better with medicine. You cannot eat or drink without vomiting. You have pain that does not get better with medicine. You are unable to pass urine. You develop a skin rash. You have a fever. You have redness around your IV site that gets worse. Get help right away if: You have difficulty breathing. You have chest pain. You have blood in your urine or stool, or you vomit blood. Summary After the procedure, it is common to have a sore throat or nausea. It is also common to feel tired. Have a responsible adult stay with you for the time you are told. It is important to have someone help care for you until you are awake and alert. When you feel hungry, start by eating small amounts of foods that are soft and easy to digest (bland), such as toast. Gradually return to your regular diet. Drink enough fluid to keep your urine pale yellow. Return to your normal activities as told by your health care provider. Ask your health care provider what activities are safe for you. This information is not intended to replace advice given to you by your health care provider. Make sure you discuss any questions you have with your health care provider. Document Revised: 01/24/2020 Document Reviewed: 08/23/2019 Elsevier  Patient Education  2022 [place]. How to Use Chlorhexidine for Bathing Chlorhexidine gluconate (CHG) is a germ-killing (antiseptic) solution that is used to clean the skin. It can get rid of the bacteria that normally live on the skin and can keep them away for about 24 hours. To clean your skin with CHG, you may be given: A CHG solution to use in the shower or as part of a sponge bath. A prepackaged cloth that contains CHG. Cleaning your skin with CHG may help lower the risk for infection: While you are staying in the intensive care unit of the hospital. If you have a vascular access, such as a central line, to provide short-term or long-term access to your veins.  If you have a catheter to drain urine from your bladder. If you are on a ventilator. A ventilator is a machine that helps you breathe by moving air in and out of your lungs. After surgery. What are the risks? Risks of using CHG include: A skin reaction. Hearing loss, if CHG gets in your ears and you have a perforated eardrum. Eye injury, if CHG gets in your eyes and is not rinsed out. The CHG product catching fire. Make sure that you avoid smoking and flames after applying CHG to your skin. Do not use CHG: If you have a chlorhexidine allergy or have previously reacted to chlorhexidine. On babies younger than 27 months of age. How to use CHG solution Use CHG only as told by your health care provider, and follow the instructions on the label. Use the full amount of CHG as directed. Usually, this is one bottle. During a shower Follow these steps when using CHG solution during a shower (unless your health care provider gives you different instructions): Start the shower. Use your normal soap and shampoo to wash your face and hair. Turn off the shower or move out of the shower stream. Pour the CHG onto a clean washcloth. Do not use any type of brush or rough-edged sponge. Starting at your neck, lather your body down to your  toes. Make sure you follow these instructions: If you will be having surgery, pay special attention to the part of your body where you will be having surgery. Scrub this area for at least 1 minute. Do not use CHG on your head or face. If the solution gets into your ears or eyes, rinse them well with water. Avoid your genital area. Avoid any areas of skin that have broken skin, cuts, or scrapes. Scrub your back and under your arms. Make sure to wash skin folds. Let the lather sit on your skin for 1-2 minutes or as long as told by your health care provider. Thoroughly rinse your entire body in the shower. Make sure that all body creases and crevices are rinsed well. Dry off with a clean towel. Do not put any substances on your body afterward--such as powder, lotion, or perfume--unless you are told to do so by your health care provider. Only use lotions that are recommended by the manufacturer. Put on clean clothes or pajamas. If it is the night before your surgery, sleep in clean sheets.  During a sponge bath Follow these steps when using CHG solution during a sponge bath (unless your health care provider gives you different instructions): Use your normal soap and shampoo to wash your face and hair. Pour the CHG onto a clean washcloth. Starting at your neck, lather your body down to your toes. Make sure you follow these instructions: If you will be having surgery, pay special attention to the part of your body where you will be having surgery. Scrub this area for at least 1 minute. Do not use CHG on your head or face. If the solution gets into your ears or eyes, rinse them well with water. Avoid your genital area. Avoid any areas of skin that have broken skin, cuts, or scrapes. Scrub your back and under your arms. Make sure to wash skin folds. Let the lather sit on your skin for 1-2 minutes or as long as told by your health care provider. Using a different clean, wet washcloth, thoroughly rinse  your entire body. Make sure that all body creases and crevices are rinsed well.  Dry off with a clean towel. Do not put any substances on your body afterward--such as powder, lotion, or perfume--unless you are told to do so by your health care provider. Only use lotions that are recommended by the manufacturer. Put on clean clothes or pajamas. If it is the night before your surgery, sleep in clean sheets. How to use CHG prepackaged cloths Only use CHG cloths as told by your health care provider, and follow the instructions on the label. Use the CHG cloth on clean, dry skin. Do not use the CHG cloth on your head or face unless your health care provider tells you to. When washing with the CHG cloth: Avoid your genital area. Avoid any areas of skin that have broken skin, cuts, or scrapes. Before surgery Follow these steps when using a CHG cloth to clean before surgery (unless your health care provider gives you different instructions): Using the CHG cloth, vigorously scrub the part of your body where you will be having surgery. Scrub using a back-and-forth motion for 3 minutes. The area on your body should be completely wet with CHG when you are done scrubbing. Do not rinse. Discard the cloth and let the area air-dry. Do not put any substances on the area afterward, such as powder, lotion, or perfume. Put on clean clothes or pajamas. If it is the night before your surgery, sleep in clean sheets.  For general bathing Follow these steps when using CHG cloths for general bathing (unless your health care provider gives you different instructions). Use a separate CHG cloth for each area of your body. Make sure you wash between any folds of skin and between your fingers and toes. Wash your body in the following order, switching to a new cloth after each step: The front of your neck, shoulders, and chest. Both of your arms, under your arms, and your hands. Your stomach and groin area, avoiding the  genitals. Your right leg and foot. Your left leg and foot. The back of your neck, your back, and your buttocks. Do not rinse. Discard the cloth and let the area air-dry. Do not put any substances on your body afterward--such as powder, lotion, or perfume--unless you are told to do so by your health care provider. Only use lotions that are recommended by the manufacturer. Put on clean clothes or pajamas. Contact a health care provider if: Your skin gets irritated after scrubbing. You have questions about using your solution or cloth. You swallow any chlorhexidine. Call your local poison control center (1-(503) 884-0950 in the [place] Get help right away if: Your eyes itch badly, or they become very red or swollen. Your skin itches badly and is red or swollen. Your hearing changes. You have trouble seeing. You have swelling or tingling in your mouth or throat. You have trouble breathing. These symptoms may represent a serious problem that is an emergency. Do not wait to see if the symptoms will go away. Get medical help right away. Call your local emergency services (911 in the [place] Do not drive yourself to the hospital. Summary Chlorhexidine gluconate (CHG) is a germ-killing (antiseptic) solution that is used to clean the skin. Cleaning your skin with CHG may help to lower your risk for infection. You may be given CHG to use for bathing. It may be in a bottle or in a prepackaged cloth to use on your skin. Carefully follow your health care provider's instructions and the instructions on the product label. Do not use CHG if you have  a chlorhexidine allergy. Contact your health care provider if your skin gets irritated after scrubbing. This information is not intended to replace advice given to you by your health care provider. Make sure you discuss any questions you have with your health care provider. Document Revised: 07/21/2020 Document Reviewed: 07/21/2020 Elsevier Patient Education  2022  [employer].

## 2021-02-04 NOTE — Pre-Procedure Instructions (Signed)
RE: plavix Received: Today Dorisann Frames, RN  Encarnacion Chu, RN; McKenzie, Candee Furbish, MD Dr. Alyson Ingles stated patient may stay on plavix.        Previous Messages   ----- Message -----  From: Encarnacion Chu, RN  Sent: 02/03/2021   1:25 PM EDT  To: Dorisann Frames, RN  Subject: RE: plavix                                     Thank you!  ----- Message -----  From: Dorisann Frames, RN  Sent: 02/03/2021   1:19 PM EDT  To: Encarnacion Chu, RN  Subject: RE: plavix                                     Hey! I am getting confirmation on this and will get back to you.   ----- Message -----  From: Encarnacion Chu, RN  Sent: 02/03/2021  11:34 AM EDT  To: Dorisann Frames, RN  Subject: plavix                                         Ileene Musa! I didn't see a note in special needs about Keldrick Switala's plavix. Does he need to stop it before surgery?

## 2021-02-05 ENCOUNTER — Encounter (HOSPITAL_COMMUNITY)
Admission: RE | Admit: 2021-02-05 | Discharge: 2021-02-05 | Disposition: A | Discharge status: Home / Self Care | Payer: Medicare Other | Source: Ambulatory Visit | Attending: Urology | Admitting: Urology

## 2021-02-05 ENCOUNTER — Other Ambulatory Visit: Payer: Self-pay

## 2021-02-05 VITALS — BP 123/60 | HR 76 | Temp 97.7°F | Resp 18 | Ht 76.0 in | Wt 195.0 lb

## 2021-02-05 DIAGNOSIS — Z01818 Encounter for other preprocedural examination: Secondary | ICD-10-CM | POA: Diagnosis not present

## 2021-02-05 DIAGNOSIS — E119 Type 2 diabetes mellitus without complications: Secondary | ICD-10-CM | POA: Diagnosis not present

## 2021-02-05 DIAGNOSIS — E1159 Type 2 diabetes mellitus with other circulatory complications: Secondary | ICD-10-CM

## 2021-02-05 LAB — CBC WITH DIFFERENTIAL/PLATELET
Abs Immature Granulocytes: 0.06 10*3/uL (ref 0.00–0.07)
Basophils Absolute: 0.1 10*3/uL (ref 0.0–0.1)
Basophils Relative: 1 %
Eosinophils Absolute: 0.3 10*3/uL (ref 0.0–0.5)
Eosinophils Relative: 4 %
HCT: 48.8 % (ref 39.0–52.0)
Hemoglobin: 16.3 g/dL (ref 13.0–17.0)
Immature Granulocytes: 1 %
Lymphocytes Relative: 22 %
Lymphs Abs: 1.8 10*3/uL (ref 0.7–4.0)
MCH: 32.3 pg (ref 26.0–34.0)
MCHC: 33.4 g/dL (ref 30.0–36.0)
MCV: 96.8 fL (ref 80.0–100.0)
Monocytes Absolute: 0.8 10*3/uL (ref 0.1–1.0)
Monocytes Relative: 10 %
Neutro Abs: 5.1 10*3/uL (ref 1.7–7.7)
Neutrophils Relative %: 62 %
Platelets: 221 10*3/uL (ref 150–400)
RBC: 5.04 MIL/uL (ref 4.22–5.81)
RDW: 12.7 % (ref 11.5–15.5)
WBC: 8.1 10*3/uL (ref 4.0–10.5)
nRBC: 0 % (ref 0.0–0.2)

## 2021-02-05 LAB — BASIC METABOLIC PANEL
Anion gap: 7 (ref 5–15)
BUN: 10 mg/dL (ref 8–23)
CO2: 22 mmol/L (ref 22–32)
Calcium: 8.9 mg/dL (ref 8.9–10.3)
Chloride: 103 mmol/L (ref 98–111)
Creatinine, Ser: 0.87 mg/dL (ref 0.61–1.24)
GFR, Estimated: >60 mL/min (ref >60)
Glucose, Bld: 141 mg/dL — ABNORMAL HIGH (ref 70–99)
Potassium: 4.1 mmol/L (ref 3.5–5.1)
Sodium: 132 mmol/L — ABNORMAL LOW (ref 135–145)

## 2021-02-05 LAB — HEMOGLOBIN A1C
Hgb A1c MFr Bld: 7.1 % — ABNORMAL HIGH (ref 4.8–5.6)
Mean Plasma Glucose: 157.07 mg/dL

## 2021-02-09 ENCOUNTER — Ambulatory Visit (HOSPITAL_COMMUNITY): Payer: Medicare Other | Admitting: Certified Registered Nurse Anesthetist

## 2021-02-09 ENCOUNTER — Other Ambulatory Visit: Payer: Self-pay

## 2021-02-09 ENCOUNTER — Encounter (HOSPITAL_COMMUNITY)
Admission: RE | Disposition: A | Discharge status: Home / Self Care | Payer: Self-pay | Source: Ambulatory Visit | Attending: Urology

## 2021-02-09 ENCOUNTER — Ambulatory Visit (HOSPITAL_COMMUNITY)
Admission: RE | Admit: 2021-02-09 | Discharge: 2021-02-09 | Disposition: A | Discharge status: Home / Self Care | Payer: Medicare Other | Source: Ambulatory Visit | Attending: Urology | Admitting: Urology

## 2021-02-09 ENCOUNTER — Encounter (HOSPITAL_COMMUNITY): Payer: Self-pay | Admitting: Urology

## 2021-02-09 DIAGNOSIS — F17210 Nicotine dependence, cigarettes, uncomplicated: Secondary | ICD-10-CM | POA: Insufficient documentation

## 2021-02-09 DIAGNOSIS — E119 Type 2 diabetes mellitus without complications: Secondary | ICD-10-CM | POA: Diagnosis not present

## 2021-02-09 DIAGNOSIS — I25119 Atherosclerotic heart disease of native coronary artery with unspecified angina pectoris: Secondary | ICD-10-CM | POA: Diagnosis not present

## 2021-02-09 DIAGNOSIS — C671 Malignant neoplasm of dome of bladder: Secondary | ICD-10-CM | POA: Diagnosis not present

## 2021-02-09 DIAGNOSIS — Z8551 Personal history of malignant neoplasm of bladder: Secondary | ICD-10-CM | POA: Insufficient documentation

## 2021-02-09 DIAGNOSIS — Z885 Allergy status to narcotic agent status: Secondary | ICD-10-CM | POA: Insufficient documentation

## 2021-02-09 DIAGNOSIS — D494 Neoplasm of unspecified behavior of bladder: Secondary | ICD-10-CM | POA: Diagnosis not present

## 2021-02-09 DIAGNOSIS — Z888 Allergy status to other drugs, medicaments and biological substances status: Secondary | ICD-10-CM | POA: Diagnosis not present

## 2021-02-09 DIAGNOSIS — C679 Malignant neoplasm of bladder, unspecified: Secondary | ICD-10-CM | POA: Diagnosis present

## 2021-02-09 HISTORY — PX: TRANSURETHRAL RESECTION OF BLADDER TUMOR: SHX2575

## 2021-02-09 HISTORY — PX: CYSTOSCOPY: SHX5120

## 2021-02-09 LAB — GLUCOSE, CAPILLARY
Glucose-Capillary: 163 mg/dL — ABNORMAL HIGH (ref 70–99)
Glucose-Capillary: 169 mg/dL — ABNORMAL HIGH (ref 70–99)

## 2021-02-09 SURGERY — CYSTOSCOPY
Anesthesia: General | Site: Bladder

## 2021-02-09 MED ORDER — PROPOFOL 10 MG/ML IV BOLUS
INTRAVENOUS | Status: AC
  Filled 2021-02-09: qty 20

## 2021-02-09 MED ORDER — FENTANYL CITRATE (PF) 250 MCG/5ML IJ SOLN
INTRAMUSCULAR | Status: DC | PRN
  Administered 2021-02-09 (×4): 50 ug via INTRAVENOUS

## 2021-02-09 MED ORDER — ALBUTEROL SULFATE HFA 108 (90 BASE) MCG/ACT IN AERS
INHALATION_SPRAY | RESPIRATORY_TRACT | Status: DC | PRN
  Administered 2021-02-09: 6 via RESPIRATORY_TRACT

## 2021-02-09 MED ORDER — 0.9 % SODIUM CHLORIDE (POUR BTL) OPTIME
TOPICAL | Status: DC | PRN
  Administered 2021-02-09: 1000 mL

## 2021-02-09 MED ORDER — FENTANYL CITRATE (PF) 250 MCG/5ML IJ SOLN
INTRAMUSCULAR | Status: AC
  Filled 2021-02-09: qty 5

## 2021-02-09 MED ORDER — CEFAZOLIN SODIUM-DEXTROSE 2-4 GM/100ML-% IV SOLN
2.0000 g | INTRAVENOUS | Status: AC
  Administered 2021-02-09: 2 g via INTRAVENOUS

## 2021-02-09 MED ORDER — ONDANSETRON HCL 4 MG/2ML IJ SOLN
INTRAMUSCULAR | Status: DC | PRN
  Administered 2021-02-09: 4 mg via INTRAVENOUS

## 2021-02-09 MED ORDER — CHLORHEXIDINE GLUCONATE 0.12 % MT SOLN
15.0000 mL | Freq: Once | OROMUCOSAL | Status: AC
  Administered 2021-02-09: 15 mL via OROMUCOSAL

## 2021-02-09 MED ORDER — SODIUM CHLORIDE 0.9 % IR SOLN
Status: DC | PRN
  Administered 2021-02-09: 3000 mL

## 2021-02-09 MED ORDER — LACTATED RINGERS IV SOLN
INTRAVENOUS | Status: DC
  Administered 2021-02-09: 1000 mL via INTRAVENOUS

## 2021-02-09 MED ORDER — FENTANYL CITRATE PF 50 MCG/ML IJ SOSY
25.0000 ug | PREFILLED_SYRINGE | INTRAMUSCULAR | Status: DC | PRN
  Administered 2021-02-09: 50 ug via INTRAVENOUS
  Filled 2021-02-09: qty 1

## 2021-02-09 MED ORDER — OXYCODONE-ACETAMINOPHEN 10-325 MG PO TABS: 2.0000 | ORAL_TABLET | Freq: Every day | ORAL | 0 refills | Status: DC | PRN

## 2021-02-09 MED ORDER — LIDOCAINE HCL (CARDIAC) PF 100 MG/5ML IV SOSY
PREFILLED_SYRINGE | INTRAVENOUS | Status: DC | PRN
  Administered 2021-02-09: 60 mg via INTRAVENOUS

## 2021-02-09 MED ORDER — PROPOFOL 10 MG/ML IV BOLUS
INTRAVENOUS | Status: DC | PRN
  Administered 2021-02-09: 200 mg via INTRAVENOUS
  Administered 2021-02-09: 40 mg via INTRAVENOUS

## 2021-02-09 MED ORDER — ORAL CARE MOUTH RINSE: 15.0000 mL | Freq: Once | OROMUCOSAL | Status: AC

## 2021-02-09 MED ORDER — CEFAZOLIN SODIUM-DEXTROSE 2-4 GM/100ML-% IV SOLN
INTRAVENOUS | Status: AC
  Filled 2021-02-09: qty 100

## 2021-02-09 SURGICAL SUPPLY — 26 items
BAG DRAIN URO TABLE W/ADPT NS (BAG) ×2 IMPLANT
BAG DRN 8 ADPR NS SKTRN CSTL (BAG) ×1
BAG DRN RND TRDRP ANRFLXCHMBR (UROLOGICAL SUPPLIES) ×1
BAG HAMPER (MISCELLANEOUS) ×2 IMPLANT
BAG URINE DRAIN 2000ML AR STRL (UROLOGICAL SUPPLIES) ×2 IMPLANT
CATH FOLEY 3WAY 30CC 22F (CATHETERS) ×2 IMPLANT
CATH FOLEY LATEX FREE 22FR (CATHETERS)
CATH FOLEY LF 22FR (CATHETERS) IMPLANT
CLOTH BEACON ORANGE TIMEOUT ST (SAFETY) ×2 IMPLANT
ELECT LOOP 22F BIPOLAR SML (ELECTROSURGICAL) ×2
ELECTRODE LOOP 22F BIPOLAR SML (ELECTROSURGICAL) ×1 IMPLANT
GLOVE SURG POLYISO LF SZ8 (GLOVE) ×2 IMPLANT
GLOVE SURG UNDER POLY LF SZ7 (GLOVE) ×4 IMPLANT
GOWN STRL REUS W/TWL LRG LVL3 (GOWN DISPOSABLE) ×4 IMPLANT
GOWN STRL REUS W/TWL XL LVL3 (GOWN DISPOSABLE) ×2 IMPLANT
IV NS IRRIG 3000ML ARTHROMATIC (IV SOLUTION) ×4 IMPLANT
KIT TURNOVER CYSTO (KITS) ×2 IMPLANT
MANIFOLD NEPTUNE II (INSTRUMENTS) ×2 IMPLANT
PACK CYSTO (CUSTOM PROCEDURE TRAY) ×2 IMPLANT
PAD ARMBOARD 7.5X6 YLW CONV (MISCELLANEOUS) ×2 IMPLANT
PLUG CATH AND CAP STER (CATHETERS) ×2 IMPLANT
SYR 30ML LL (SYRINGE) ×2 IMPLANT
SYR TOOMEY IRRIG 70ML (MISCELLANEOUS) ×2
SYRINGE TOOMEY IRRIG 70ML (MISCELLANEOUS) ×1 IMPLANT
TOWEL OR 17X26 4PK STRL BLUE (TOWEL DISPOSABLE) ×2 IMPLANT
WATER STERILE IRR 500ML POUR (IV SOLUTION) ×2 IMPLANT

## 2021-02-09 NOTE — Anesthesia Preprocedure Evaluation (Signed)
Anesthesia Evaluation  Patient identified by MRN, date of birth, ID band Patient awake    Reviewed: Allergy & Precautions, H&P , NPO status , Patient's Chart, lab work & pertinent test results, reviewed documented beta blocker date and time   Airway Mallampati: II  TM Distance: >3 FB Neck ROM: full    Dental no notable dental hx.    Pulmonary sleep apnea , COPD, Current Smoker and Patient abstained from smoking.,    Pulmonary exam normal breath sounds clear to auscultation       Cardiovascular Exercise Tolerance: Good hypertension, + angina + CAD   Rhythm:regular Rate:Normal     Neuro/Psych PSYCHIATRIC DISORDERS Depression  Neuromuscular disease CVA, Residual Symptoms    GI/Hepatic Neg liver ROS, GERD  Medicated,  Endo/Other  negative endocrine ROSdiabetes  Renal/GU negative Renal ROS  negative genitourinary   Musculoskeletal   Abdominal   Peds  Hematology  (+) Blood dyscrasia, anemia ,   Anesthesia Other Findings   Reproductive/Obstetrics negative OB ROS                             Anesthesia Physical Anesthesia Plan  ASA: 3  Anesthesia Plan: General   Post-op Pain Management:    Induction:   PONV Risk Score and Plan: Ondansetron  Airway Management Planned:   Additional Equipment:   Intra-op Plan:   Post-operative Plan:   Informed Consent: I have reviewed the patients History and Physical, chart, labs and discussed the procedure including the risks, benefits and alternatives for the proposed anesthesia with the patient or authorized representative who has indicated his/her understanding and acceptance.     Dental Advisory Given  Plan Discussed with: CRNA  Anesthesia Plan Comments:         Anesthesia Quick Evaluation

## 2021-02-09 NOTE — Progress Notes (Signed)
Call to DR. McKenzie, he will contact patient friend/caretaker Andrew [name] to discuss surgery today. Patient friend notified verballly of MD will call.

## 2021-02-09 NOTE — Anesthesia Procedure Notes (Signed)
Procedure Name: LMA Insertion Date/Time: 02/09/2021 10:46 AM Performed by: Karna Dupes, CRNA Pre-anesthesia Checklist: Patient identified, Emergency Drugs available, Suction available and Patient being monitored Patient Re-evaluated:Patient Re-evaluated prior to induction Oxygen Delivery Method: Circle system utilized Preoxygenation: Pre-oxygenation with 100% oxygen Induction Type: IV induction LMA: LMA inserted LMA Size: 4.0 Number of attempts: 1 Tube secured with: Tape Dental Injury: Teeth and Oropharynx as per pre-operative assessment

## 2021-02-09 NOTE — Transfer of Care (Signed)
Immediate Anesthesia Transfer of Care Note  Patient: Andrew [name]  Procedure(s) Performed: CYSTOSCOPY (Bladder) TRANSURETHRAL RESECTION OF BLADDER TUMOR (TURBT) (Bladder)  Patient Location: PACU  Anesthesia Type:General  Level of Consciousness: drowsy  Airway & Oxygen Therapy: Patient Spontanous Breathing and Patient connected to face mask oxygen  Post-op Assessment: Report given to RN and Post -op Vital signs reviewed and stable  Post vital signs: Reviewed and stable  Last Vitals:  Vitals Value Taken Time  BP 88/54 02/09/21 1122  Temp    Pulse 100 02/09/21 1124  Resp 13 02/09/21 1124  SpO2 96 % 02/09/21 1124  Vitals shown include unvalidated device data.  Last Pain:  Vitals:   02/09/21 0934  TempSrc: Oral  PainSc: 0-No pain      Patients Stated Pain Goal: 5 ([id number])  Complications: No notable events documented.

## 2021-02-09 NOTE — H&P (Signed)
Urology Admission H&P  Chief Complaint: Bladder cancer  History of Present Illness: [name] is a 70yo here for bladder tumor resection. He was found to have a tumor recurrence in 11/2020. He denies any LUTS. No hematuria.  Past Medical History:  Diagnosis Date   Acute viral pericarditis 09/03/2018   Inferior STE - but Negative Troponin.  CP &SOB.  Felt to be Viral.   Anemia    Arthritis    Bladder cancer ([place]) 2014   Cataract    bilateral   Depression    Diabetes mellitus without complication ([place])    Essential hypertension 01/20/2018   in the past no longer on medication   GERD (gastroesophageal reflux disease)    Hyperlipidemia with target LDL less than 70 12/19/2015   Intraoperative floppy iris syndrome (IFIS) 07/2019   Multivessel CAD - CTO dLAD, mCx. DES PCI RI, PTCA of RPDA 01/21/2018   12/2017 - Cath for ? STEMI -> distal/apical LAD & m-dCx CTO (unable to cross Cx).  Mod rPDA. Severe RI - DES PCI.  08/2018 - Cath for ? Inf STEMI - RI stent patent & CTO dLAD/mCx. Progression of rPDA 95% -> PTCA only.  Thought to be Pericarditis & not MI (troponin negative).    Neuromuscular disorder ([place])    Sleep apnea    BiPAP not currently being used   Status post tendon repair 1989   STEMI (ST elevation myocardial infarction) ([place]) 01/20/2018   Cardiac cath January 21, 2018: Severe multivessel disease with unclear lesion, but opted for PCI/DES x1 to the RI.  Appeared to have CTO of the distal LAD and circumflex as well as moderate mid LAD and diagonal disease as well as PDA.Marland Kitchen  Unable to cross circumflex lesion.   Stroke ([place]) 11/2015   At times pt has dizziness with loss of vision   Tremors of nervous system 2011   Past Surgical History:  Procedure Laterality Date   APPENDECTOMY     BLADDER SURGERY     COLONOSCOPY     CORONARY STENT INTERVENTION N/A 01/21/2018   Procedure: CORONARY STENT INTERVENTION;  Surgeon: Leonie Man, MD;  Location: [facility];  Service:  Cardiovascular;  Laterality: N/A;  95% Ramus Intermedius -PCI with synergy DES 2.25 mm x 12 mm postdilated 2.4 mm.   CORONARY/GRAFT ACUTE MI REVASCULARIZATION N/A 01/21/2018   Procedure: Coronary/Graft Acute MI Revascularization;  Surgeon: Leonie Man, MD;  Location: [facility];  Service: Cardiovascular;  Laterality: N/A;  attempted revas to distal CFX;    CORONARY/GRAFT ACUTE MI REVASCULARIZATION N/A 09/04/2018   Procedure: Coronary/Graft Acute MI Revascularization;  Surgeon: Sherren Mocha, MD;  Location: [facility]:: PTCA/POBA of rPDA (2.0 mm balloon)   CYSTOSCOPY N/A 01/21/2020   Procedure: CYSTOSCOPY;  Surgeon: Cleon Gustin, MD;  Location: [facility];  Service: Urology;  Laterality: N/A;   CYSTOSCOPY W/ RETROGRADES Bilateral 08/22/2015   Procedure: CYSTOSCOPY WITH RETROGRADE PYELOGRAM;  Surgeon: Irine Seal, MD;  Location: [facility];  Service: Urology;  Laterality: Bilateral;   CYSTOSCOPY W/ RETROGRADES Bilateral 01/16/2016   Procedure: CYSTOSCOPY WITH RETROGRADE PYELOGRAM;  Surgeon: Irine Seal, MD;  Location: [facility];  Service: Urology;  Laterality: Bilateral;   CYSTOSCOPY W/ RETROGRADES Bilateral 01/07/2017   Procedure: CYSTOSCOPY WITH RETROGRADE PYELOGRAM;  Surgeon: Irine Seal, MD;  Location: [facility];  Service: Urology;  Laterality: Bilateral;   CYSTOSCOPY WITH BIOPSY N/A 08/22/2015   Procedure: CYSTOSCOPY WITH BLADDER BIOPSY;  Surgeon: Irine Seal, MD;  Location:  [facility];  Service: Urology;  Laterality: N/A;   CYSTOSCOPY WITH BIOPSY N/A 01/16/2016   Procedure: CYSTOSCOPY WITH BLADDER BIOPSY;  Surgeon: Irine Seal, MD;  Location: [facility];  Service: Urology;  Laterality: N/A;   CYSTOSCOPY WITH FULGERATION N/A 01/16/2016   Procedure: CYSTOSCOPY WITH FULGERATION;  Surgeon: Irine Seal, MD;  Location: [facility];  Service: Urology;  Laterality: N/A;   KNEE ARTHROSCOPY Right 1989   LEFT HEART CATH AND CORONARY ANGIOGRAPHY N/A 01/21/2018   Procedure: LEFT HEART CATH AND CORONARY ANGIOGRAPHY;   Surgeon: Leonie Man, MD;  Location: [facility];  Service: Cardiovascular;; 95% RI-DES PCI.  80% o-pCX (PTCA) -> dCX 80%-100% CTO (unsuccessful PTCA unable to cross distal lesion with balloon.)  100% CTO dLAD. RPDA 80% and 70% as well as P AV 180%, PA V2 60%.  (too small for PCI)   LEFT HEART CATH AND CORONARY ANGIOGRAPHY N/A 09/04/2018   Procedure: LEFT HEART CATH AND CORONARY ANGIOGRAPHY;  Surgeon: Sherren Mocha, MD;  Location: [facility]: (presumed Inferior STEMI) = progression of small rPDA -95% (PTCA only). Patent RI stent. CTO of apical LAD & mCx.   ROTATOR CUFF REPAIR Bilateral 2000   rt. leg fracture surgery     TRANSTHORACIC ECHOCARDIOGRAM  01/21/2018   Mild LVH.  EF 50 and 55%.  Apical inferior hypokinesis.  Mid-apical anterolateral hypokinesis.  Normal diastolic function for age    TRANSTHORACIC ECHOCARDIOGRAM  09/04/2018   -? Inf STEMI vs. Pericarditis:  Mildly reduced EF of 45 to 50%.  Impaired relaxation-GR 1 DD.  Severe hypokinesis of the anterolateral and inferolateral wall.  Small-moderate anterior pericardial effusion.  Moderate aortic sclerosis but no stenosis.   TRANSURETHRAL RESECTION OF BLADDER TUMOR N/A 01/07/2017   Procedure: TRANSURETHRAL RESECTION OF BLADDER TUMOR (TURBT);  Surgeon: Irine Seal, MD;  Location: [facility];  Service: Urology;  Laterality: N/A;   TRANSURETHRAL RESECTION OF BLADDER TUMOR N/A 01/21/2020   Procedure: TRANSURETHRAL RESECTION OF BLADDER TUMOR (TURBT);  Surgeon: Cleon Gustin, MD;  Location: [facility];  Service: Urology;  Laterality: N/A;   WISDOM TOOTH EXTRACTION      Home Medications:  Current Facility-Administered Medications  Medication Dose Route Frequency Provider Last Rate Last Admin   ceFAZolin (ANCEF) 2-4 GM/100ML-% IVPB            ceFAZolin (ANCEF) IVPB 2g/100 mL premix  2 g Intravenous 30 min Pre-Op Patrena Santalucia, Candee Furbish, MD       lactated ringers infusion   Intravenous Continuous Louann Sjogren, MD 10 mL/hr at  02/09/21 0949 1,000 mL at 02/09/21 0949   Allergies:  Allergies  Allergen Reactions   Tramadol Shortness Of Breath    And dizziness   Trulicity [Dulaglutide] Swelling    Ankles and feet swell    Family History  Problem Relation Age of Onset   Diabetes Mother 67       type 1   Stroke Mother    Dementia Father    Diabetes Brother    Heart attack Brother 62   Testicular cancer Brother    Diabetes Brother    Cancer Brother        testicular   Colon cancer Neg Hx    Colon polyps Neg Hx    Esophageal cancer Neg Hx    Rectal cancer Neg Hx    Stomach cancer Neg Hx    Social History:  reports that he has been smoking cigarettes. He has a 50.00 pack-year smoking history. He  has never used smokeless tobacco. He reports that he does not drink alcohol and does not use drugs.  Review of Systems  All other systems reviewed and are negative.  Physical Exam:  Vital signs in last 24 hours: Temp:  [98 F (36.7 C)] 98 F (36.7 C) (09/19 0934) Pulse Rate:  [82] 82 (09/19 0934) Resp:  [19] 19 (09/19 0934) BP: (117)/(76) 117/76 (09/19 0934) SpO2:  [95 %] 95 % (09/19 0934) Physical Exam Vitals reviewed.  Constitutional:      Appearance: Normal appearance.  HENT:     Head: Normocephalic and atraumatic.     Nose: Nose normal. No congestion.  Eyes:     Extraocular Movements: Extraocular movements intact.     Pupils: Pupils are equal, round, and reactive to light.  Cardiovascular:     Rate and Rhythm: Normal rate and regular rhythm.  Pulmonary:     Effort: Pulmonary effort is normal. No respiratory distress.  Abdominal:     General: Abdomen is flat. There is no distension.  Musculoskeletal:        General: No swelling. Normal range of motion.     Cervical back: Normal range of motion and neck supple.  Skin:    General: Skin is warm and dry.  Neurological:     General: No focal deficit present.     Mental Status: He is alert and oriented to person, place, and time.  Psychiatric:         Mood and Affect: Mood normal.        Behavior: Behavior normal.        Thought Content: Thought content normal.        Judgment: Judgment normal.    Laboratory Data:  Results for orders placed or performed during the hospital encounter of 02/09/21 (from the past 24 hour(s))  Glucose, capillary     Status: Abnormal   Collection Time: 02/09/21  9:30 AM  Result Value Ref Range   Glucose-Capillary 163 (H) 70 - 99 mg/dL   No results found for this or any previous visit (from the past 240 hour(s)). Creatinine: Recent Labs    02/05/21 1133  CREATININE 0.87   Baseline Creatinine: 0.87  Impression/Assessment:  70yo with a bladder tumor  Plan:  The risks/benefits/alternatives to bladder tumor resection were explained to the patient and he understands and wishes to proceed with surgery  Nicolette Bang 02/09/2021, 10:22 AM

## 2021-02-09 NOTE — Op Note (Signed)
.  Preoperative diagnosis: bladder tumor  Postoperative diagnosis: Same  Procedure: 1 cystoscopy 2. Transurethral resection of bladder tumor, small  Attending: Rosie Fate  Anesthesia: General  Estimated blood loss: Minimal  Drains: 22 French foley  Specimens: none  Antibiotics: ancef  Findings: 1cm papillary dome tumor.  Ureteral orifices in normal anatomic location.   Indications: Patient is a 70 year old male/male with a history of bladder tumor.  After discussing treatment options, they decided proceed with transurethral resection of a bladder tumor.  Procedure in detail: The patient was brought to the operating room and a brief timeout was done to ensure correct patient, correct procedure, correct site.  General anesthesia was administered patient was placed in dorsal lithotomy position.  Their genitalia was then prepped and draped in usual sterile fashion.  A rigid 51 French cystoscope was passed in the urethra and the bladder.  Bladder was inspected and we noted a 1cm bladder tumor on the dome.  the ureteral orifices were in the normal orthotopic locations. We then removed the cystoscope and placed a resectoscope into the bladder. We proceeded to remove the large clot burden from the bladder. Once this was complete we turned our attention to the bladder tumor. Using the bipolar resectoscope we removed the bladder tumor down to the base.  We then re-inspected the bladder and found no residula bleeding.  the bladder was then drained, a 22 French foley was placed and this concluded the procedure which was well tolerated by patient.  Complications: None  Condition: Stable, extubated, transferred to PACU  Plan: Patient is to be discharged home and followup in 5 days for foley catheter removal and pathology discussion.

## 2021-02-09 NOTE — OR Nursing (Signed)
Patient arrived this morning for procedure. He was very upset. Stated that he did not want anyone to speak to his daughter about his surgery.  His daughter is listed as POA. He relayed information that he and his daughter is not getting along at this time. He also verbalized that he was upset because of the way his grandson has been treated. He wanted [place] to remove his daughter off of his chart as his POA. Any information he wanted that to be given to [place] Ann ( his girlfriend). He said that we could contact his grandson but wasn't able to give [place] his number since he left his phone at home. He commented that he will call back to give [place] his number so it can be added to his chart. Patient calmed down after he revealed his frustration about his home situations.  It was reinforced that his information will be kept private and the only information will be given to his girlfriend. Patient was took back to preop area and prepped for surgery. He was in a more relaxed state of mind.

## 2021-02-09 NOTE — Anesthesia Postprocedure Evaluation (Signed)
Anesthesia Post Note  Patient: Andrew [name]  Procedure(s) Performed: CYSTOSCOPY (Bladder) TRANSURETHRAL RESECTION OF BLADDER TUMOR (TURBT) (Bladder)  Patient location during evaluation: Phase II Anesthesia Type: General Level of consciousness: awake Pain management: pain level controlled Vital Signs Assessment: post-procedure vital signs reviewed and stable Respiratory status: spontaneous breathing and respiratory function stable Cardiovascular status: blood pressure returned to baseline and stable Postop Assessment: no headache and no apparent nausea or vomiting Anesthetic complications: no Comments: Late entry   No notable events documented.   Last Vitals:  Vitals:   02/09/21 1300 02/09/21 1310  BP: 128/89 130/88  Pulse: 68 74  Resp: (!) 23 18  Temp:  36.6 C  SpO2: 93% 98%    Last Pain:  Vitals:   02/09/21 1310  TempSrc: Oral  PainSc: [place]

## 2021-02-10 ENCOUNTER — Encounter (HOSPITAL_COMMUNITY): Payer: Self-pay | Admitting: Urology

## 2021-02-16 ENCOUNTER — Ambulatory Visit (INDEPENDENT_AMBULATORY_CARE_PROVIDER_SITE_OTHER): Payer: Medicare Other | Admitting: Urology

## 2021-02-16 ENCOUNTER — Encounter: Payer: Self-pay | Admitting: Urology

## 2021-02-16 ENCOUNTER — Other Ambulatory Visit: Payer: Self-pay

## 2021-02-16 VITALS — BP 119/62 | HR 84

## 2021-02-16 DIAGNOSIS — C679 Malignant neoplasm of bladder, unspecified: Secondary | ICD-10-CM | POA: Diagnosis not present

## 2021-02-16 NOTE — Progress Notes (Signed)
02/16/2021 11:36 AM   Andrew [name] Dec 11, 1950 [id number]  Referring provider: No referring provider defined for this encounter.  Followup bladder cancer   HPI: Andrew [name] is a 70yo here for followup after bladder tumor resection. Voiding trial passed today. No hematuria. He is having severe bladder spasms. No other complaints today.    PMH: Past Medical History:  Diagnosis Date   Acute viral pericarditis 09/03/2018   Inferior STE - but Negative Troponin.  CP &SOB.  Felt to be Viral.   Anemia    Arthritis    Bladder cancer ([place]) 2014   Cataract    bilateral   Depression    Diabetes mellitus without complication ([place])    Essential hypertension 01/20/2018   in the past no longer on medication   GERD (gastroesophageal reflux disease)    Hyperlipidemia with target LDL less than 70 12/19/2015   Intraoperative floppy iris syndrome (IFIS) 07/2019   Multivessel CAD - CTO dLAD, mCx. DES PCI RI, PTCA of RPDA 01/21/2018   12/2017 - Cath for ? STEMI -> distal/apical LAD & m-dCx CTO (unable to cross Cx).  Mod rPDA. Severe RI - DES PCI.  08/2018 - Cath for ? Inf STEMI - RI stent patent & CTO dLAD/mCx. Progression of rPDA 95% -> PTCA only.  Thought to be Pericarditis & not MI (troponin negative).    Neuromuscular disorder ([place])    Sleep apnea    BiPAP not currently being used   Status post tendon repair 1989   STEMI (ST elevation myocardial infarction) ([place]) 01/20/2018   Cardiac cath January 21, 2018: Severe multivessel disease with unclear lesion, but opted for PCI/DES x1 to the RI.  Appeared to have CTO of the distal LAD and circumflex as well as moderate mid LAD and diagonal disease as well as PDA.Marland Kitchen  Unable to cross circumflex lesion.   Stroke ([place]) 11/2015   At times pt has dizziness with loss of vision   Tremors of nervous system 2011    Surgical History: Past Surgical History:  Procedure Laterality Date   APPENDECTOMY     BLADDER SURGERY     COLONOSCOPY     CORONARY STENT  INTERVENTION N/A 01/21/2018   Procedure: CORONARY STENT INTERVENTION;  Surgeon: Leonie Man, MD;  Location: [facility];  Service: Cardiovascular;  Laterality: N/A;  95% Ramus Intermedius -PCI with synergy DES 2.25 mm x 12 mm postdilated 2.4 mm.   CORONARY/GRAFT ACUTE MI REVASCULARIZATION N/A 01/21/2018   Procedure: Coronary/Graft Acute MI Revascularization;  Surgeon: Leonie Man, MD;  Location: [facility];  Service: Cardiovascular;  Laterality: N/A;  attempted revas to distal CFX;    CORONARY/GRAFT ACUTE MI REVASCULARIZATION N/A 09/04/2018   Procedure: Coronary/Graft Acute MI Revascularization;  Surgeon: Sherren Mocha, MD;  Location: [facility]:: PTCA/POBA of rPDA (2.0 mm balloon)   CYSTOSCOPY N/A 01/21/2020   Procedure: CYSTOSCOPY;  Surgeon: Cleon Gustin, MD;  Location: [facility];  Service: Urology;  Laterality: N/A;   CYSTOSCOPY N/A 02/09/2021   Procedure: CYSTOSCOPY;  Surgeon: Cleon Gustin, MD;  Location: [facility];  Service: Urology;  Laterality: N/A;   CYSTOSCOPY W/ RETROGRADES Bilateral 08/22/2015   Procedure: CYSTOSCOPY WITH RETROGRADE PYELOGRAM;  Surgeon: Irine Seal, MD;  Location: [facility];  Service: Urology;  Laterality: Bilateral;   CYSTOSCOPY W/ RETROGRADES Bilateral 01/16/2016   Procedure: CYSTOSCOPY WITH RETROGRADE PYELOGRAM;  Surgeon: Irine Seal, MD;  Location: [facility];  Service: Urology;  Laterality: Bilateral;  CYSTOSCOPY W/ RETROGRADES Bilateral 01/07/2017   Procedure: CYSTOSCOPY WITH RETROGRADE PYELOGRAM;  Surgeon: Irine Seal, MD;  Location: [facility];  Service: Urology;  Laterality: Bilateral;   CYSTOSCOPY WITH BIOPSY N/A 08/22/2015   Procedure: CYSTOSCOPY WITH BLADDER BIOPSY;  Surgeon: Irine Seal, MD;  [facility];  Service: Urology;  Laterality: N/A;   CYSTOSCOPY WITH BIOPSY N/A 01/16/2016   Procedure: CYSTOSCOPY WITH BLADDER BIOPSY;  Surgeon: Irine Seal, MD;  [facility]: [facility];  Service: Urology;  Laterality: N/A;   CYSTOSCOPY WITH  FULGERATION N/A 01/16/2016   Procedure: CYSTOSCOPY WITH FULGERATION;  Surgeon: Irine Seal, MD;  [facility]: [facility];  Service: Urology;  Laterality: N/A;   KNEE ARTHROSCOPY Right 1989   LEFT HEART CATH AND CORONARY ANGIOGRAPHY N/A 01/21/2018   Procedure: LEFT HEART CATH AND CORONARY ANGIOGRAPHY;  Surgeon: Leonie Man, MD;  Location: [facility];  Service: Cardiovascular;; 95% RI-DES PCI.  80% o-pCX (PTCA) -> dCX 80%-100% CTO (unsuccessful PTCA unable to cross distal lesion with balloon.)  100% CTO dLAD. RPDA 80% and 70% as well as P AV 180%, PA V2 60%.  (too small for PCI)   LEFT HEART CATH AND CORONARY ANGIOGRAPHY N/A 09/04/2018   Procedure: LEFT HEART CATH AND CORONARY ANGIOGRAPHY;  Surgeon: Sherren Mocha, MD;  Location: [facility]: (presumed Inferior STEMI) = progression of small rPDA -95% (PTCA only). Patent RI stent. CTO of apical LAD & mCx.   ROTATOR CUFF REPAIR Bilateral 2000   rt. leg fracture surgery     TRANSTHORACIC ECHOCARDIOGRAM  01/21/2018   Mild LVH.  EF 50 and 55%.  Apical inferior hypokinesis.  Mid-apical anterolateral hypokinesis.  Normal diastolic function for age    TRANSTHORACIC ECHOCARDIOGRAM  09/04/2018   -? Inf STEMI vs. Pericarditis:  Mildly reduced EF of 45 to 50%.  Impaired relaxation-GR 1 DD.  Severe hypokinesis of the anterolateral and inferolateral wall.  Small-moderate anterior pericardial effusion.  Moderate aortic sclerosis but no stenosis.   TRANSURETHRAL RESECTION OF BLADDER TUMOR N/A 01/07/2017   Procedure: TRANSURETHRAL RESECTION OF BLADDER TUMOR (TURBT);  Surgeon: Irine Seal, MD;  Location: [facility];  Service: Urology;  Laterality: N/A;   TRANSURETHRAL RESECTION OF BLADDER TUMOR N/A 01/21/2020   Procedure: TRANSURETHRAL RESECTION OF BLADDER TUMOR (TURBT);  Surgeon: Cleon Gustin, MD;  Location: [facility];  Service: Urology;  Laterality: N/A;   TRANSURETHRAL RESECTION OF BLADDER TUMOR N/A 02/09/2021   Procedure: TRANSURETHRAL RESECTION OF BLADDER  TUMOR (TURBT);  Surgeon: Cleon Gustin, MD;  Location: [facility];  Service: Urology;  Laterality: N/A;   WISDOM TOOTH EXTRACTION      Home Medications:  Allergies as of 02/16/2021       Reactions   Tramadol Shortness Of Breath   And dizziness   Trulicity [dulaglutide] Swelling   Ankles and feet swell        Medication List        Accurate as of February 16, 2021 11:36 AM. If you have any questions, ask your nurse or doctor.          Basaglar KwikPen 100 UNIT/ML Inject 12 Units into the skin daily.   clopidogrel 75 MG tablet Commonly known as: PLAVIX Take 75 mg by mouth once.   empagliflozin 25 MG Tabs tablet Commonly known as: JARDIANCE Take 25 mg by mouth daily.   fenofibrate 48 MG tablet Commonly known as: Tricor Take 1 tablet (48 mg total) by mouth daily.   isosorbide mononitrate 30 MG 24 hr tablet  Commonly known as: IMDUR Take 1 tablet (30 mg total) by mouth daily. What changed:  how much to take when to take this   ketoconazole 2 % cream Commonly known as: NIZORAL Apply 1 application topically daily as needed for irritation.   LUBRICATING EYE DROPS OP Place 1 drop into both eyes daily as needed (dry eyes).   metFORMIN 1000 MG tablet Commonly known as: GLUCOPHAGE Take 1 tablet by mouth 2 times daily with a meal. What changed:  how much to take how to take this when to take this additional instructions   nitroGLYCERIN 0.4 MG SL tablet Commonly known as: NITROSTAT Place 1 tablet (0.4 mg total) under the tongue every 5 (five) minutes as needed for chest pain.   oxyCODONE-acetaminophen 10-325 MG tablet Commonly known as: PERCOCET Take 2 tablets by mouth daily as needed for pain.   pantoprazole 40 MG tablet Commonly known as: PROTONIX Take 1 tablet (40 mg total) by mouth daily.   primidone 50 MG tablet Commonly known as: MYSOLINE Take 1 tablet (50 mg total) by mouth 4 (four) times daily. What changed: how much to take   propranolol 20  MG tablet Commonly known as: INDERAL Take 20 mg by mouth 2 (two) times daily.   rosuvastatin 40 MG tablet Commonly known as: CRESTOR Take 1 tablet (40 mg total) by mouth daily. What changed: when to take this   sitaGLIPtin 100 MG tablet Commonly known as: JANUVIA Take 100 mg by mouth daily.   tamsulosin 0.4 MG Caps capsule Commonly known as: FLOMAX Take 1 capsule (0.4 mg total) by mouth daily.        Allergies:  Allergies  Allergen Reactions   Tramadol Shortness Of Breath    And dizziness   Trulicity [Dulaglutide] Swelling    Ankles and feet swell    Family History: Family History  Problem Relation Age of Onset   Diabetes Mother 86       type 1   Stroke Mother    Dementia Father    Diabetes Brother    Heart attack Brother 40   Testicular cancer Brother    Diabetes Brother    Cancer Brother        testicular   Colon cancer Neg Hx    Colon polyps Neg Hx    Esophageal cancer Neg Hx    Rectal cancer Neg Hx    Stomach cancer Neg Hx     Social History:  reports that he has been smoking cigarettes. He has a 50.00 pack-year smoking history. He has never used smokeless tobacco. He reports that he does not drink alcohol and does not use drugs.  ROS: All other review of systems were reviewed and are negative except what is noted above in HPI  Physical Exam: There were no vitals taken for this visit.  Constitutional:  Alert and oriented, No acute distress. HEENT: [place] AT, moist mucus membranes.  Trachea midline, no masses. Cardiovascular: No clubbing, cyanosis, or edema. Respiratory: Normal respiratory effort, no increased work of breathing. GI: Abdomen is soft, nontender, nondistended, no abdominal masses GU: No CVA tenderness.  Lymph: No cervical or inguinal lymphadenopathy. Skin: No rashes, bruises or suspicious lesions. Neurologic: Grossly intact, no focal deficits, moving all 4 extremities. Psychiatric: Normal mood and affect.  Laboratory Data: Lab Results   Component Value Date   WBC 8.1 02/05/2021   HGB 16.3 02/05/2021   HCT 48.8 02/05/2021   MCV 96.8 02/05/2021   PLT 221 02/05/2021  Lab Results  Component Value Date   CREATININE 0.87 02/05/2021    No results found for: PSA  No results found for: TESTOSTERONE  Lab Results  Component Value Date   HGBA1C 7.1 (H) 02/05/2021    Urinalysis    Component Value Date/Time   COLORURINE YELLOW 09/03/2018 0000   APPEARANCEUR Clear 12/10/2020 1420   LABSPEC 1.028 09/03/2018 0000   PHURINE 5.0 09/03/2018 0000   GLUCOSEU 3+ (A) 12/10/2020 1420   HGBUR SMALL (A) 09/03/2018 0000   BILIRUBINUR Negative 12/10/2020 1420   KETONESUR NEGATIVE 09/03/2018 0000   PROTEINUR Negative 12/10/2020 1420   PROTEINUR NEGATIVE 09/03/2018 0000   UROBILINOGEN 0.2 10/15/2019 1606   NITRITE Negative 12/10/2020 1420   NITRITE NEGATIVE 09/03/2018 0000   LEUKOCYTESUR Negative 12/10/2020 1420   LEUKOCYTESUR NEGATIVE 09/03/2018 0000    Lab Results  Component Value Date   LABMICR See below: 12/10/2020   WBCUA None seen 12/10/2020   RBCUA None seen 01/15/2016   LABEPIT 0-10 12/10/2020   MUCUS Present 01/15/2016   BACTERIA None seen 12/10/2020    Pertinent Imaging:  No results found for this or any previous visit.  No results found for this or any previous visit.  No results found for this or any previous visit.  No results found for this or any previous visit.  No results found for this or any previous visit.  No results found for this or any previous visit.  No results found for this or any previous visit.  Results for orders placed during the hospital encounter of 01/14/17  CT RENAL STONE STUDY  Narrative CLINICAL DATA:  Sharp back pain and hematuria. Recent bladder tumor removed 1 week ago.  EXAM: CT ABDOMEN AND PELVIS WITHOUT CONTRAST  TECHNIQUE: Multidetector CT imaging of the abdomen and pelvis was performed following the standard protocol without IV  contrast.  COMPARISON:  None.  FINDINGS: Lower chest: No acute abnormality. Right coronary artery atherosclerotic vascular calcifications.  Hepatobiliary: Hepatic steatosis. Scattered punctate granulomas throughout the liver, consistent with prior granulomatous disease. The gallbladder is decompressed. No biliary dilatation.  Pancreas: Unremarkable. No pancreatic ductal dilatation or surrounding inflammatory changes.  Spleen: Normal size without focal abnormality. Multiple punctate calcifications within the spleen, consistent with prior granulomatous disease.  Adrenals/Urinary Tract: The adrenal glands are unremarkable. No renal or ureteral calculi. Minimal left greater than right perinephric fat stranding, nonspecific. No hydronephrosis. The bladder is unremarkable.  Stomach/Bowel: Stomach is within normal limits. Appendix is surgically absent. No evidence of bowel wall thickening, distention, or inflammatory changes. Scattered left-sided colonic diverticulosis.  Vascular/Lymphatic: Aortic atherosclerosis. No enlarged abdominal or pelvic lymph nodes.  Reproductive: Prostate is unremarkable.  Other: No abdominal wall hernia or abnormality. No abdominopelvic ascites.  Musculoskeletal: No acute osseous abnormality. Degenerative changes of the thoracolumbar spine with severe lower lumbar facet arthropathy. 6 mm anterolisthesis of L4 on L5.  IMPRESSION: 1. No evidence of acute intra-abdominal process. No explanation for the patient's symptoms. 2. No renal or ureteral calculi.  No hydronephrosis. 3. Hepatic steatosis. 4.  Aortic atherosclerosis (ICD10-I70.0).   Electronically Signed By: Titus Dubin M.D. On: 01/14/2017 08:52   Assessment & Plan:    1. Malignant neoplasm of urinary bladder, unspecified site ([place]) -RTC 3 months with cystoscopy   Return in about 3 months (around 05/18/2021) for cystoscopy.  Nicolette Bang, MD  [facility]  [place]

## 2021-02-16 NOTE — Progress Notes (Signed)
Fill and Pull Catheter Removal  Patient is present today for a catheter removal.  Patient was cleaned and prepped in a sterile fashion 195ml of sterile water/ saline was instilled into the bladder when the patient felt the urge to urinate. 85ml of water was then drained from the balloon.  A 24FR foley cath was removed from the bladder no complications were noted .  Patient as then given some time to void on their own.  Patient can void  152ml on their own after some time.  Patient tolerated well.  Performed by: Estill Bamberg RN  Follow up/ Additional notes: MD to see   Urological Symptom Review  Patient is experiencing the following symptoms: Erection problems (male only)   Review of Systems  Gastrointestinal (upper)  : Negative for upper GI symptoms  Gastrointestinal (lower) : Diarrhea  Constitutional : Fatigue  Skin: Negative for skin symptoms  Eyes: Blurred vision  Ear/Nose/Throat : Sinus problems  Hematologic/Lymphatic: Negative for Hematologic/Lymphatic symptoms  Cardiovascular : Negative for cardiovascular symptoms  Respiratory : Cough Shortness of breath  Endocrine: Negative for endocrine symptoms  Musculoskeletal: Joint pain  Neurological: Negative for neurological symptoms  Psychologic: Negative for psychiatric symptoms

## 2021-02-16 NOTE — Patient Instructions (Signed)
Bladder Cancer Bladder cancer is a condition in which abnormal tissue (a tumor) grows in the bladder. The bladder is the organ that holds urine. Two tubes (ureters) carry the urine from the kidneys to the bladder. The bladder wall is made of layers of tissue. Cancer that spreads through these layers of the bladder wall becomes more difficult to treat. What are the causes? The cause of this condition is not known. What increases the risk? The following factors may make you more likely to develop this condition: Smoking. Working where there are risks (occupational exposures), such as working with rubber, leather, clothing fabric, dyes, chemicals, and paint. Being 55 years of age or older. Being male. Having bladder inflammation that is long-term (chronic). Having a history of cancer, including: A family history of bladder cancer. Personal experience with bladder cancer. Having had certain treatments for cancer before. These include: Medicines to kill cancer cells (chemotherapy). Strong X-ray beams or capsules high in energy to kill cancer cells and shrink tumors (radiation therapy). Having been exposed to arsenic. This is a chemical element that can poison you. What are the signs or symptoms? Early symptoms of this condition include: Seeing blood in your urine. Feeling pain when urinating. Having infections of your urinary system (urinary tract infections or UTIs) that happen often. Having to urinate sooner or more often than usual. Later symptoms of this condition include: Not being able to urinate. Pain on one side of your lower back. Loss of appetite. Weight loss. Tiredness (fatigue). Swelling in your feet. Bone pain. How is this diagnosed? This condition is diagnosed based on: Your medical history. A physical exam. Lab tests, such as urine tests. Imaging tests. Your symptoms. You may also have other tests or procedures done, such as: A cystoscopy. A narrow tube is inserted  into your bladder through the organ that connects your bladder to the outside of your body (urethra). This is done to view the lining of your bladder for tumors. A biopsy. This procedure involves removing a tissue sample to look at it under a microscope to see if cancer is present. It is important to find out: How deeply into the bladder wall cancer has grown. Whether cancer has spread to any other parts of your body. This may require blood tests or imaging tests, such as a CT scan, MRI, bone scan, or X-rays. How is this treated? Your health care provider may recommend one or more types of treatment based on the stage of your cancer. The most common types of treatment are: Surgery to remove the cancer. Procedures that may be done include: Removing a tumor on the inside wall of the bladder (transurethral resection). Removing the bladder (cystectomy). Radiation therapy. This is often used together with chemotherapy. Chemotherapy. Immunotherapy. This uses medicines to help your immune system destroy cancer cells. Follow these instructions at home: Take over-the-counter and prescription medicines only as told by your health care provider. Eat a healthy diet. Some of your treatments might affect your appetite. Do not use any products that contain nicotine or tobacco, such as cigarettes, e-cigarettes, and chewing tobacco. If you need help quitting, ask your health care provider. Consider joining a support group. This may help you learn to cope with the stress of having bladder cancer. Tell your cancer care team if you develop side effects. Your team may be able to recommend ways to get relief. Keep all follow-up visits as told by your health care provider. This is important. Where to find more information American   Cancer Society: www.cancer.org National Cancer Institute (NCI): www.cancer.gov Contact a health care provider if: You have symptoms of a urinary tract infection. These  include: Fever. Chills. Weakness. Muscle aches. Pain in your abdomen. Urge to urinate that is stronger and happens more often than usual. Burning feeling in the bladder or urethra when you urinate. Get help right away if: There is blood in your urine. You cannot urinate. You have severe pain or other symptoms that do not go away. Summary Bladder cancer is a condition in which tumors grow in the bladder and cause illness. This condition is diagnosed based on your medical history, a physical exam, lab tests, imaging tests, and your symptoms. Your health care provider may recommend one or more types of treatment based on the stage of your cancer. Consider joining a support group. This may help you learn to cope with the stress of having bladder cancer. This information is not intended to replace advice given to you by your health care provider. Make sure you discuss any questions you have with your health care provider. Document Revised: 01/17/2019 Document Reviewed: 01/17/2019 [employer] Patient Education  2022 [employer]  

## 2021-02-18 ENCOUNTER — Ambulatory Visit: Payer: Medicare Other | Admitting: Gastroenterology

## 2021-02-20 DIAGNOSIS — R300 Dysuria: Secondary | ICD-10-CM | POA: Diagnosis not present

## 2021-02-23 ENCOUNTER — Telehealth: Payer: Self-pay

## 2021-02-23 ENCOUNTER — Other Ambulatory Visit: Payer: Self-pay

## 2021-02-23 ENCOUNTER — Other Ambulatory Visit: Payer: Medicare Other

## 2021-02-23 DIAGNOSIS — N390 Urinary tract infection, site not specified: Secondary | ICD-10-CM

## 2021-02-23 DIAGNOSIS — C679 Malignant neoplasm of bladder, unspecified: Secondary | ICD-10-CM

## 2021-02-23 NOTE — Telephone Encounter (Signed)
Pt called said  he was having upset stomach no fever , with chills,nausea and vomiting. Pt said that he has burn with urination. Pt said this started after he had his cysto done here  in the office. Pt  tried to reach the office however the office was closed and he was treated by his pcp by virtual visit. Pt was given a antibiotic. No UA was done for patient due to pt symptoms pt wasn't allowed in the office . Pt was told stop by the office to give a ua to send for culture ,pt was informed that the culture the results will not back until 48 hours. We contact pt with results. Pt was told to keep taking the antibiotic given by his pcp and follow up with  the symptoms gets worse if we closed to follow up with ED.

## 2021-02-26 DIAGNOSIS — B351 Tinea unguium: Secondary | ICD-10-CM | POA: Diagnosis not present

## 2021-02-26 DIAGNOSIS — M79676 Pain in unspecified toe(s): Secondary | ICD-10-CM | POA: Diagnosis not present

## 2021-02-26 DIAGNOSIS — E1142 Type 2 diabetes mellitus with diabetic polyneuropathy: Secondary | ICD-10-CM | POA: Diagnosis not present

## 2021-02-26 DIAGNOSIS — L84 Corns and callosities: Secondary | ICD-10-CM | POA: Diagnosis not present

## 2021-02-27 ENCOUNTER — Other Ambulatory Visit: Payer: Self-pay

## 2021-02-27 ENCOUNTER — Telehealth: Payer: Self-pay

## 2021-02-27 DIAGNOSIS — N390 Urinary tract infection, site not specified: Secondary | ICD-10-CM

## 2021-02-27 LAB — URINE CULTURE

## 2021-02-27 MED ORDER — CIPROFLOXACIN HCL 500 MG PO TABS: 500.0000 mg | ORAL_TABLET | Freq: Two times a day (BID) | ORAL | 0 refills | Status: DC

## 2021-02-27 NOTE — Telephone Encounter (Signed)
Called patient and left message of antibiotic sent to pharmacy in regards to recent urine culture

## 2021-03-03 DIAGNOSIS — G1223 Primary lateral sclerosis: Secondary | ICD-10-CM | POA: Diagnosis not present

## 2021-03-03 DIAGNOSIS — R2681 Unsteadiness on feet: Secondary | ICD-10-CM | POA: Diagnosis not present

## 2021-03-05 DIAGNOSIS — R6889 Other general symptoms and signs: Secondary | ICD-10-CM | POA: Diagnosis not present

## 2021-03-05 DIAGNOSIS — C679 Malignant neoplasm of bladder, unspecified: Secondary | ICD-10-CM | POA: Diagnosis not present

## 2021-03-05 DIAGNOSIS — Z794 Long term (current) use of insulin: Secondary | ICD-10-CM | POA: Diagnosis not present

## 2021-03-05 DIAGNOSIS — Z87891 Personal history of nicotine dependence: Secondary | ICD-10-CM | POA: Diagnosis not present

## 2021-03-05 DIAGNOSIS — F17210 Nicotine dependence, cigarettes, uncomplicated: Secondary | ICD-10-CM | POA: Diagnosis not present

## 2021-03-05 DIAGNOSIS — I25119 Atherosclerotic heart disease of native coronary artery with unspecified angina pectoris: Secondary | ICD-10-CM | POA: Diagnosis not present

## 2021-03-05 DIAGNOSIS — R319 Hematuria, unspecified: Secondary | ICD-10-CM | POA: Diagnosis not present

## 2021-03-05 DIAGNOSIS — Z0000 Encounter for general adult medical examination without abnormal findings: Secondary | ICD-10-CM | POA: Diagnosis not present

## 2021-03-05 DIAGNOSIS — I10 Essential (primary) hypertension: Secondary | ICD-10-CM | POA: Diagnosis not present

## 2021-03-05 DIAGNOSIS — E1151 Type 2 diabetes mellitus with diabetic peripheral angiopathy without gangrene: Secondary | ICD-10-CM | POA: Diagnosis not present

## 2021-03-05 DIAGNOSIS — E785 Hyperlipidemia, unspecified: Secondary | ICD-10-CM | POA: Diagnosis not present

## 2021-03-09 ENCOUNTER — Telehealth: Payer: Self-pay

## 2021-03-09 NOTE — Telephone Encounter (Signed)
-----   Message from Cleon Gustin, MD sent at 03/09/2021  8:59 AM EDT ----- Continue the cipro ----- Message ----- From: Iris Pert, LPN Sent: 99/07/7167  10:46 AM EDT To: Cleon Gustin, MD  Patient started on cipro 500 bid

## 2021-03-09 NOTE — Telephone Encounter (Signed)
Patient called and made aware.

## 2021-03-30 DIAGNOSIS — E538 Deficiency of other specified B group vitamins: Secondary | ICD-10-CM | POA: Diagnosis not present

## 2021-04-02 ENCOUNTER — Encounter: Payer: Self-pay | Admitting: Gastroenterology

## 2021-04-02 ENCOUNTER — Telehealth: Payer: Self-pay

## 2021-04-02 ENCOUNTER — Ambulatory Visit (INDEPENDENT_AMBULATORY_CARE_PROVIDER_SITE_OTHER): Payer: Medicare Other | Admitting: Gastroenterology

## 2021-04-02 VITALS — BP 98/62 | HR 91 | Ht 76.0 in | Wt 191.0 lb

## 2021-04-02 DIAGNOSIS — R194 Change in bowel habit: Secondary | ICD-10-CM | POA: Diagnosis not present

## 2021-04-02 DIAGNOSIS — Z86010 Personal history of colonic polyps: Secondary | ICD-10-CM | POA: Diagnosis not present

## 2021-04-02 DIAGNOSIS — K219 Gastro-esophageal reflux disease without esophagitis: Secondary | ICD-10-CM

## 2021-04-02 DIAGNOSIS — Z7902 Long term (current) use of antithrombotics/antiplatelets: Secondary | ICD-10-CM | POA: Diagnosis not present

## 2021-04-02 MED ORDER — SUTAB 1479-225-188 MG PO TABS: 1.0000 | ORAL_TABLET | Freq: Once | ORAL | 0 refills | Status: AC

## 2021-04-02 MED ORDER — FIBER CHOICE FRUITY BITES 1.5 G PO CHEW: CHEWABLE_TABLET | ORAL | Status: DC

## 2021-04-02 NOTE — Patient Instructions (Addendum)
If you are age 71 or older, your body mass index should be between 23-30. Your Body mass index is 23.25 kg/m. If this is out of the aforementioned range listed, please consider follow up with your Primary Care Provider.  If you are age 24 or younger, your body mass index should be between 19-25. Your Body mass index is 23.25 kg/m. If this is out of the aformentioned range listed, please consider follow up with your Primary Care Provider.   ________________________________________________________  The [facility] providers would like to encourage you to use [facility] to communicate with providers for non-urgent requests or questions.  Due to long hold times on the telephone, sending your provider a message by [facility] may be a faster and more efficient way to get a response.  Please allow 48 business hours for a response.  Please remember that this is for non-urgent requests.  _______________________________________________________  Dennis Bast have been scheduled for a EGD/Colonoscopy. Please follow written instructions given to you at your visit today.  Please pick up your prep supplies at the pharmacy within the next 1-3 days. If you use inhalers (even only as needed), please bring them with you on the day of your procedure.  You will be contacted by our office prior to your procedure for directions on holding your Plavix.  If you do not hear from our office 1 week prior to your scheduled procedure, please call [phone] to discuss.  Please take a 81 mg aspirin on the days you hold Plavix.     We have given you samples of the following medication to take: Fiber Choice: Take as directed  Thank you for entrusting me with your care and for choosing [facility], Dr. [place] Cellar

## 2021-04-02 NOTE — Telephone Encounter (Signed)
   Andrew [name] 1951-02-28 [id number]  Dear Dr. Jac Canavan:  We have scheduled the above named patient for a(n) EGD and Colonoscopy procedure. Our records show that (s)he is on anticoagulation therapy.  Please advise as to whether the patient may come off their therapy of PLAVIX 5 days prior to their procedure which is scheduled for 06-05-21.  Please route your response to Lemar Lofty, CMA or fax response to [phone]. Thanks for the prompt reply  Sincerely,    [facility]

## 2021-04-02 NOTE — Telephone Encounter (Signed)
Letter was faxed to Dr. Fuller Song at [facility] in [place] at Fax: [phone]. Phone: [phone]

## 2021-04-02 NOTE — Progress Notes (Signed)
HPI :  70 year old male with a history of bladder cancer, colon polyps, coronary artery disease on Plavix, tobacco use, GERD, here to reestablish care for GERD and history of colon polyps, altered bowel habits.  Referred by Melissa Montane, MD. I have not seen him since May 2019.  Recall that his last colonoscopy was in May 2019.  He had 7 polyps removed, the largest was 1 cm in size.  Most adenomas and sessile serrated polyps.  It was recommended that he have a repeat colonoscopy in 3 years.  At that time he had normal bowel habits.  He states he has had some altered bowel habits starting this past summer.  He mostly had loose stools that were intermittent and now having occasional loose stools and occasional constipation.  He has some abdominal discomfort and cramping when he is not having a bowel movement, can go upwards of a few days without a bowel movement and then have multiple loose stools.  Denies any new changes in his medication since it started happening.  In recent weeks he states stools are slightly more formed and more regular than before.  He states he has had some dark stools, by this he means they are dark brown, no melena.  No red blood in the stools.  He has had bladder cancer and has undergone a recurrent cystoscopies with urology, states he had one a few months ago and took some time to recover from that.  He otherwise endorses that he has had longstanding GERD since childhood.  He states he took a lot of Rolaids and Tums over the years, "I should have invested in them".  In recent years has been on Protonix 40 mg once daily.  He states this controls his heartburn quite well and is happy with the regimen.  He states he had an EGD in 1990 when he was around 70 years old, he has not had an endoscopy since that time.  Denies any family history of esophageal cancer or colon cancer.  He does have a significant cardiac history with cardiac stenting and multivessel disease.  He had been on  Plavix 75 mg daily.  He denies any chest pain or shortness of breath.  He does continue to smoke cigarettes and has had a hard time quitting.  His last cardiac evaluation was in 2020 at which point time he had a cardiac catheter and echo, EF was around 50%.   Prior work-up:  Colonoscopy 10/14/2017 -  - One 7 mm polyp in the cecum, removed with a cold snare. Resected and retrieved. - Two 8 to 10 mm polyps in the ascending colon, removed with a cold snare. Resected and retrieved. - One 5 mm polyp in the sigmoid colon, removed with a cold snare. Resected and retrieved. - Two 3 to 5 mm polyps in the sigmoid colon, removed with a cold snare. Resected and retrieved. - One 7 mm polyp in the rectum, removed with a hot snare. Resected and retrieved. - Internal hemorrhoids. - The examination was otherwise normal. - Several minutes spent lavaging the colon to achieve adequate views  1. Surgical [P], rectosigmoid, sigmoid, ascending, and cecum, polyp (6) - TUBULAR ADENOMA. - MULTIPLE FRAGMENTS OF SESSILE SERRATED POLYP WITHOUT DYSPLASIA. - MULTIPLE FRAGMENTS OF HYPERPLASTIC POLYP. - NO HIGH GRADE DYSPLASIA OR MALIGNANCY. 2. Surgical [P], rectum, polyp - LIPOMA. - NO DYSPLASIA OR MALIGNANCY.   Echo 09/04/2018 - EF 45-50%  Cardiac cath 09/04/18 - 1.  Multivessel coronary artery disease with  chronic occlusion of the apical LAD and mid circumflex, continued patency of the stented segment in the ramus intermedius, and severe progressive stenosis of the right PDA 2.  Mild segmental contraction abnormality the left ventricle with preserved overall LVEF estimated at 55 to 60% 3.  Normal LVEDP 4.  Successful balloon angioplasty of the right PDA, treated with p.o. BA because of small vessel caliber      Past Medical History:  Diagnosis Date   Acute viral pericarditis 09/03/2018   Inferior STE - but Negative Troponin.  CP &SOB.  Felt to be Viral.   Anemia    Arthritis    Bladder cancer ([place]) 2014    CAD (coronary artery disease)    Cataract    bilateral   Depression    Diabetes mellitus without complication ([place])    Essential hypertension 01/20/2018   in the past no longer on medication   GERD (gastroesophageal reflux disease)    Hx of adenomatous colonic polyps 09/2017   colonoscopy   Hyperlipidemia with target LDL less than 70 12/19/2015   Intraoperative floppy iris syndrome (IFIS) 07/2019   Multivessel CAD - CTO dLAD, mCx. DES PCI RI, PTCA of RPDA 01/21/2018   12/2017 - Cath for ? STEMI -> distal/apical LAD & m-dCx CTO (unable to cross Cx).  Mod rPDA. Severe RI - DES PCI.  08/2018 - Cath for ? Inf STEMI - RI stent patent & CTO dLAD/mCx. Progression of rPDA 95% -> PTCA only.  Thought to be Pericarditis & not MI (troponin negative).    Neuromuscular disorder ([place])    Sleep apnea    BiPAP not currently being used   Status post tendon repair 1989   STEMI (ST elevation myocardial infarction) ([place]) 01/20/2018   Cardiac cath January 21, 2018: Severe multivessel disease with unclear lesion, but opted for PCI/DES x1 to the RI.  Appeared to have CTO of the distal LAD and circumflex as well as moderate mid LAD and diagonal disease as well as PDA.Marland Kitchen  Unable to cross circumflex lesion.   Stroke ([place]) 11/2015   At times pt has dizziness with loss of vision   Tremors of nervous system 2011     Past Surgical History:  Procedure Laterality Date   APPENDECTOMY     BLADDER SURGERY     COLONOSCOPY     CORONARY STENT INTERVENTION N/A 01/21/2018   Procedure: CORONARY STENT INTERVENTION;  Surgeon: Leonie Man, MD;  Location: [facility];  Service: Cardiovascular;  Laterality: N/A;  95% Ramus Intermedius -PCI with synergy DES 2.25 mm x 12 mm postdilated 2.4 mm.   CORONARY/GRAFT ACUTE MI REVASCULARIZATION N/A 01/21/2018   Procedure: Coronary/Graft Acute MI Revascularization;  Surgeon: Leonie Man, MD;  Location: [facility];  Service: Cardiovascular;  Laterality: N/A;  attempted  revas to distal CFX;    CORONARY/GRAFT ACUTE MI REVASCULARIZATION N/A 09/04/2018   Procedure: Coronary/Graft Acute MI Revascularization;  Surgeon: Sherren Mocha, MD;  Location: [place] [facility] [facility]:: PTCA/POBA of rPDA (2.0 mm balloon)   CYSTOSCOPY N/A 01/21/2020   Procedure: CYSTOSCOPY;  Surgeon: Cleon Gustin, MD;  Location: [facility];  Service: Urology;  Laterality: N/A;   CYSTOSCOPY N/A 02/09/2021   Procedure: CYSTOSCOPY;  Surgeon: Cleon Gustin, MD;  Location: [facility];  Service: Urology;  Laterality: N/A;   CYSTOSCOPY W/ RETROGRADES Bilateral 08/22/2015   Procedure: CYSTOSCOPY WITH RETROGRADE PYELOGRAM;  Surgeon: Irine Seal, MD;  Location: [facility];  Service: Urology;  Laterality: Bilateral;  CYSTOSCOPY W/ RETROGRADES Bilateral 01/16/2016   Procedure: CYSTOSCOPY WITH RETROGRADE PYELOGRAM;  Surgeon: Irine Seal, MD;  Location: [facility];  Service: Urology;  Laterality: Bilateral;   CYSTOSCOPY W/ RETROGRADES Bilateral 01/07/2017   Procedure: CYSTOSCOPY WITH RETROGRADE PYELOGRAM;  Surgeon: Irine Seal, MD;  Location: [facility];  Service: Urology;  Laterality: Bilateral;   CYSTOSCOPY WITH BIOPSY N/A 08/22/2015   Procedure: CYSTOSCOPY WITH BLADDER BIOPSY;  Surgeon: Irine Seal, MD;  [facility]: [facility];  Service: Urology;  Laterality: N/A;   CYSTOSCOPY WITH BIOPSY N/A 01/16/2016   Procedure: CYSTOSCOPY WITH BLADDER BIOPSY;  Surgeon: Irine Seal, MD;  [facility]: [facility];  Service: Urology;  Laterality: N/A;   CYSTOSCOPY WITH FULGERATION N/A 01/16/2016   Procedure: CYSTOSCOPY WITH FULGERATION;  Surgeon: Irine Seal, MD;  [facility]: [facility];  Service: Urology;  Laterality: N/A;   KNEE ARTHROSCOPY Right 1989   LEFT HEART CATH AND CORONARY ANGIOGRAPHY N/A 01/21/2018   Procedure: LEFT HEART CATH AND CORONARY ANGIOGRAPHY;  Surgeon: Leonie Man, MD;  Location: [place];  Service: Cardiovascular;; 95% RI-DES PCI.  80% o-pCX (PTCA) -> dCX 80%-100% CTO (unsuccessful PTCA unable to cross distal lesion with  balloon.)  100% CTO dLAD. RPDA 80% and 70% as well as P AV 180%, PA V2 60%.  (too small for PCI)   LEFT HEART CATH AND CORONARY ANGIOGRAPHY N/A 09/04/2018   Procedure: LEFT HEART CATH AND CORONARY ANGIOGRAPHY;  Surgeon: Sherren Mocha, MD;  Location: [facility]: (presumed Inferior STEMI) = progression of small rPDA -95% (PTCA only). Patent RI stent. CTO of apical LAD & mCx.   ROTATOR CUFF REPAIR Bilateral 2000   rt. leg fracture surgery     TRANSTHORACIC ECHOCARDIOGRAM  01/21/2018   Mild LVH.  EF 50 and 55%.  Apical inferior hypokinesis.  Mid-apical anterolateral hypokinesis.  Normal diastolic function for age    TRANSTHORACIC ECHOCARDIOGRAM  09/04/2018   -? Inf STEMI vs. Pericarditis:  Mildly reduced EF of 45 to 50%.  Impaired relaxation-GR 1 DD.  Severe hypokinesis of the anterolateral and inferolateral wall.  Small-moderate anterior pericardial effusion.  Moderate aortic sclerosis but no stenosis.   TRANSURETHRAL RESECTION OF BLADDER TUMOR N/A 01/07/2017   Procedure: TRANSURETHRAL RESECTION OF BLADDER TUMOR (TURBT);  Surgeon: Irine Seal, MD;  Location: [facility];  Service: Urology;  Laterality: N/A;   TRANSURETHRAL RESECTION OF BLADDER TUMOR N/A 01/21/2020   Procedure: TRANSURETHRAL RESECTION OF BLADDER TUMOR (TURBT);  Surgeon: Cleon Gustin, MD;  Location: [facility];  Service: Urology;  Laterality: N/A;   TRANSURETHRAL RESECTION OF BLADDER TUMOR N/A 02/09/2021   Procedure: TRANSURETHRAL RESECTION OF BLADDER TUMOR (TURBT);  Surgeon: Cleon Gustin, MD;  Location: [facility];  Service: Urology;  Laterality: N/A;   WISDOM TOOTH EXTRACTION     Family History  Problem Relation Age of Onset   Diabetes Mother 44       type 1   Stroke Mother    Dementia Father    Diabetes Brother    Heart attack Brother 21   Testicular cancer Brother    Diabetes Brother    Cancer Brother        testicular   Colon cancer Neg Hx    Colon polyps Neg Hx    Esophageal cancer Neg Hx    Rectal cancer Neg  Hx    Stomach cancer Neg Hx    Pancreatic cancer Neg Hx    Social History   Tobacco Use   Smoking status: Every Day  Packs/day: 1.00    Years: 50.00    Pack years: 50.00    Types: Cigarettes   Smokeless tobacco: Former  Scientific laboratory technician Use: Never used  Substance Use Topics   Alcohol use: No    Comment: rarely   Drug use: No   Current Outpatient Medications  Medication Sig Dispense Refill   Carboxymethylcellul-Glycerin (LUBRICATING EYE DROPS OP) Place 1 drop into both eyes daily as needed (dry eyes).     clopidogrel (PLAVIX) 75 MG tablet Take 75 mg by mouth once.     empagliflozin (JARDIANCE) 25 MG TABS tablet Take 25 mg by mouth daily. 90 mg 3   fenofibrate (TRICOR) 48 MG tablet Take 1 tablet (48 mg total) by mouth daily. 90 tablet 3   Insulin Glargine (BASAGLAR KWIKPEN) 100 UNIT/ML Inject 12 Units into the skin daily.     Inulin (FIBER CHOICE FRUITY BITES) 1.5 g CHEW Take daily as directed     isosorbide mononitrate (IMDUR) 30 MG 24 hr tablet Take 1 tablet (30 mg total) by mouth daily. (Patient taking differently: Take 15 mg by mouth every evening.) 90 tablet 3   ketoconazole (NIZORAL) 2 % cream Apply 1 application topically daily as needed for irritation.     metFORMIN (GLUCOPHAGE) 1000 MG tablet Take 1 tablet by mouth 2 times daily with a meal. (Patient taking differently: Take 1,000 mg by mouth in the morning and at bedtime. Late night & Afternoon) 180 tablet 3   nitroGLYCERIN (NITROSTAT) 0.4 MG SL tablet Place 1 tablet (0.4 mg total) under the tongue every 5 (five) minutes as needed for chest pain. 25 tablet 6   oxyCODONE-acetaminophen (PERCOCET) 10-325 MG tablet Take 2 tablets by mouth daily as needed for pain. 15 tablet 0   pantoprazole (PROTONIX) 40 MG tablet Take 1 tablet (40 mg total) by mouth daily. 90 tablet 1   primidone (MYSOLINE) 50 MG tablet Take 1 tablet (50 mg total) by mouth 4 (four) times daily. (Patient taking differently: No sig reported) 360 tablet 3    propranolol (INDERAL) 20 MG tablet Take 20 mg by mouth 2 (two) times daily.     rosuvastatin (CRESTOR) 40 MG tablet Take 1 tablet (40 mg total) by mouth daily. 90 tablet 3   sitaGLIPtin (JANUVIA) 100 MG tablet Take 100 mg by mouth daily.      Sodium Sulfate-Mag Sulfate-KCl (SUTAB) [phone] MG TABS Take 1 kit by mouth once for 1 dose. 24 tablet 0   tamsulosin (FLOMAX) 0.4 MG CAPS capsule Take 1 capsule (0.4 mg total) by mouth daily. 90 capsule 3   No current facility-administered medications for this visit.   Allergies  Allergen Reactions   Tramadol Shortness Of Breath    And dizziness   Trulicity [Dulaglutide] Swelling    Ankles and feet swell     Review of Systems: All systems reviewed and negative except where noted in HPI.   Lab Results  Component Value Date   WBC 8.1 02/05/2021   HGB 16.3 02/05/2021   HCT 48.8 02/05/2021   MCV 96.8 02/05/2021   PLT 221 02/05/2021    Lab Results  Component Value Date   CREATININE 0.87 02/05/2021   BUN 10 02/05/2021   NA 132 (L) 02/05/2021   K 4.1 02/05/2021   CL 103 02/05/2021   CO2 22 02/05/2021    Lab Results  Component Value Date   ALT 10 11/19/2019   AST 9 11/19/2019   ALKPHOS 66 11/19/2019   BILITOT  0.2 11/19/2019     Physical Exam: BP 98/62   Pulse 91   Ht '6\' 4"'  (1.93 m)   Wt 191 lb (86.6 kg)   SpO2 94%   BMI 23.25 kg/m  Constitutional: Pleasant,well-developed, male in no acute distress. HEENT: Normocephalic and atraumatic.  Neck supple.  Cardiovascular: Normal rate, regular rhythm.  Pulmonary/chest: Effort normal and breath sounds normal.  Abdominal: Soft, nondistended, nontender. There are no masses palpable.  Extremities: no edema Neurological: Alert and oriented to person place and time. Skin: Skin is warm and dry. No rashes noted. Psychiatric: Normal mood and affect. Behavior is normal.   ASSESSMENT AND PLAN: 70 year old male here to reestablish for the following:  Change in bowel  habits History of colon polyps GERD Antiplatelet use  Patient has a history of multiple adenomas including an advanced adenoma more than 3 years ago.  He is due for surveillance colonoscopy in this light.  He has had some change in bowel habits in recent months, initially loose stools and now alternating loose stools and constipated stools without otherwise alarm symptoms.  He has no anemia.  Recommended daily fiber supplement to see if that will help provide some regularity for him.  Ultimately discussed whether or not he wanted to proceed with a surveillance colonoscopy.  I discussed risk benefits of colonoscopy and anesthesia with him and following this discussion he wants to proceed.  He states he is stable from a cardiac perspective and denies any symptoms at this time.  We will reach out to his cardiologist to see if he can hold his Plavix for 5 days prior to exam.  I would recommend he take a baby aspirin when he stops the Plavix for his colonoscopy, he is agreeable to do this.  We otherwise discussed his longstanding reflux.  It appears controlled with Protonix but he has had symptoms for years, significant tobacco history, age, ethnicity, gender, he has multiple risk factors for Barrett's esophagus I offered him a screening EGD at the same time as his colonoscopy.  After discussion of this he strongly wished to have an endoscopy.  Further recommendations pending results and his course  Plan: - schedule for EGD and colonoscopy - will ask for approval to hold Plavix for 5 days prior to procedure, he understands risks of holding Plavix, would start 61m aspirin while holding Plavix - continue protonix - start Fiber supplement daily - samples given of Fiber One tablets today  SJolly Mango MD [facility]  CC: MJolinda Croak MD

## 2021-04-07 DIAGNOSIS — R2681 Unsteadiness on feet: Secondary | ICD-10-CM | POA: Diagnosis not present

## 2021-04-07 DIAGNOSIS — E538 Deficiency of other specified B group vitamins: Secondary | ICD-10-CM | POA: Diagnosis not present

## 2021-04-07 DIAGNOSIS — G1223 Primary lateral sclerosis: Secondary | ICD-10-CM | POA: Diagnosis not present

## 2021-04-07 NOTE — Telephone Encounter (Signed)
Called and spoke to receptionist at [facility] office.  She will give the nurse for Dr. Jac Canavan a message regarding the request we sent last week for patient to hold Plavix for 5 days prior to his ECL . Advised that Dr. Armanda Magic reply be sent via fax # [phone]. Attn: Jan

## 2021-04-09 NOTE — Telephone Encounter (Signed)
Called and spoke to Dr. Armanda Magic office. Women who answered the phone indicated she will fax me Dr. Armanda Magic recommendations regarding holding Plavix for upcoming procedure. 2nd floor Fax verified as: [phone]

## 2021-04-09 NOTE — Telephone Encounter (Signed)
Received reply from Dr. Jac Canavan - "He has had previous cardiac care at [facility] cardiology in [place]. Most recent interventional procedure was angioplasty in 2020. He does have a history of CTO of distal LAD and left circumflex. He has been maintaining  Plavix long-term.  At his office visit with me in July he had no complaints of any anginal symptoms. He should be able to interrupt Plavix for 5 days for GI endoscopic procedures then resume Plavix on the same date after the procedure".

## 2021-04-13 NOTE — Telephone Encounter (Signed)
Called and LM for patient that he has been cleared to hold Plavix for 5 days prior to his procedure on 06-05-2021. Patient will need to start holding on 1-8.  Requested a call back to confirm dates and understanding

## 2021-04-14 DIAGNOSIS — E538 Deficiency of other specified B group vitamins: Secondary | ICD-10-CM | POA: Diagnosis not present

## 2021-04-18 ENCOUNTER — Other Ambulatory Visit: Payer: Self-pay | Admitting: Cardiology

## 2021-04-21 DIAGNOSIS — E538 Deficiency of other specified B group vitamins: Secondary | ICD-10-CM | POA: Diagnosis not present

## 2021-04-21 NOTE — Telephone Encounter (Signed)
Called and spoke to patient. He confirmed understanding to hold Plavix starting on 05-31-21 for procedure on 1-13

## 2021-05-14 DIAGNOSIS — M79676 Pain in unspecified toe(s): Secondary | ICD-10-CM | POA: Diagnosis not present

## 2021-05-14 DIAGNOSIS — B351 Tinea unguium: Secondary | ICD-10-CM | POA: Diagnosis not present

## 2021-05-14 DIAGNOSIS — L84 Corns and callosities: Secondary | ICD-10-CM | POA: Diagnosis not present

## 2021-05-14 DIAGNOSIS — E1142 Type 2 diabetes mellitus with diabetic polyneuropathy: Secondary | ICD-10-CM | POA: Diagnosis not present

## 2021-05-20 ENCOUNTER — Other Ambulatory Visit: Payer: Self-pay

## 2021-05-20 ENCOUNTER — Encounter: Payer: Self-pay | Admitting: Urology

## 2021-05-20 ENCOUNTER — Ambulatory Visit (INDEPENDENT_AMBULATORY_CARE_PROVIDER_SITE_OTHER): Payer: Medicare Other | Admitting: Urology

## 2021-05-20 VITALS — BP 128/73 | HR 87

## 2021-05-20 DIAGNOSIS — N390 Urinary tract infection, site not specified: Secondary | ICD-10-CM

## 2021-05-20 LAB — URINALYSIS, ROUTINE W REFLEX MICROSCOPIC
Bilirubin, UA: NEGATIVE
Ketones, UA: NEGATIVE
Leukocytes,UA: NEGATIVE
Nitrite, UA: NEGATIVE
Protein,UA: NEGATIVE
Specific Gravity, UA: 1.01 (ref 1.005–1.030)
Urobilinogen, Ur: 1 mg/dL (ref 0.2–1.0)
pH, UA: 6.5 (ref 5.0–7.5)

## 2021-05-20 LAB — MICROSCOPIC EXAMINATION
Bacteria, UA: NONE SEEN
RBC, Urine: >30 /hpf — AB (ref 0–2)
Renal Epithel, UA: NONE SEEN /hpf
WBC, UA: NONE SEEN /hpf (ref 0–5)

## 2021-05-20 MED ORDER — CIPROFLOXACIN HCL 500 MG PO TABS
500.0000 mg | ORAL_TABLET | Freq: Once | ORAL | Status: AC
  Administered 2021-05-20: 14:00:00 500 mg via ORAL

## 2021-05-20 NOTE — Patient Instructions (Signed)
Bladder Cancer Bladder cancer is a condition in which abnormal tissue (a tumor) grows in the bladder. The bladder is the organ that holds urine. Two tubes (ureters) carry the urine from the kidneys to the bladder. The bladder wall is made of layers of tissue. Cancer that spreads through these layers of the bladder wall becomes more difficult to treat. What are the causes? The cause of this condition is not known. What increases the risk? The following factors may make you more likely to develop this condition: Smoking. Working where there are risks (occupational exposures), such as working with rubber, leather, clothing fabric, dyes, chemicals, and paint. Being 55 years of age or older. Being male. Having bladder inflammation that is long-term (chronic). Having a history of cancer, including: A family history of bladder cancer. Personal experience with bladder cancer. Having had certain treatments for cancer before. These include: Medicines to kill cancer cells (chemotherapy). Strong X-ray beams or capsules high in energy to kill cancer cells and shrink tumors (radiation therapy). Having been exposed to arsenic. This is a chemical element that can poison you. What are the signs or symptoms? Early symptoms of this condition include: Seeing blood in your urine. Feeling pain when urinating. Having infections of your urinary system (urinary tract infections or UTIs) that happen often. Having to urinate sooner or more often than usual. Later symptoms of this condition include: Not being able to urinate. Pain on one side of your lower back. Loss of appetite. Weight loss. Tiredness (fatigue). Swelling in your feet. Bone pain. How is this diagnosed? This condition is diagnosed based on: Your medical history. A physical exam. Lab tests, such as urine tests. Imaging tests. Your symptoms. You may also have other tests or procedures done, such as: A cystoscopy. A narrow tube is inserted  into your bladder through the organ that connects your bladder to the outside of your body (urethra). This is done to view the lining of your bladder for tumors. A biopsy. This procedure involves removing a tissue sample to look at it under a microscope to see if cancer is present. It is important to find out: How deeply into the bladder wall cancer has grown. Whether cancer has spread to any other parts of your body. This may require blood tests or imaging tests, such as a CT scan, MRI, bone scan, or X-rays. How is this treated? Your health care provider may recommend one or more types of treatment based on the stage of your cancer. The most common types of treatment are: Surgery to remove the cancer. Procedures that may be done include: Removing a tumor on the inside wall of the bladder (transurethral resection). Removing the bladder (cystectomy). Radiation therapy. This is often used together with chemotherapy. Chemotherapy. Immunotherapy. This uses medicines to help your immune system destroy cancer cells. Follow these instructions at home: Take over-the-counter and prescription medicines only as told by your health care provider. Eat a healthy diet. Some of your treatments might affect your appetite. Do not use any products that contain nicotine or tobacco, such as cigarettes, e-cigarettes, and chewing tobacco. If you need help quitting, ask your health care provider. Consider joining a support group. This may help you learn to cope with the stress of having bladder cancer. Tell your cancer care team if you develop side effects. Your team may be able to recommend ways to get relief. Keep all follow-up visits as told by your health care provider. This is important. Where to find more information American   Cancer Society: www.cancer.org National Cancer Institute (NCI): www.cancer.gov Contact a health care provider if: You have symptoms of a urinary tract infection. These  include: Fever. Chills. Weakness. Muscle aches. Pain in your abdomen. Urge to urinate that is stronger and happens more often than usual. Burning feeling in the bladder or urethra when you urinate. Get help right away if: There is blood in your urine. You cannot urinate. You have severe pain or other symptoms that do not go away. Summary Bladder cancer is a condition in which tumors grow in the bladder and cause illness. This condition is diagnosed based on your medical history, a physical exam, lab tests, imaging tests, and your symptoms. Your health care provider may recommend one or more types of treatment based on the stage of your cancer. Consider joining a support group. This may help you learn to cope with the stress of having bladder cancer. This information is not intended to replace advice given to you by your health care provider. Make sure you discuss any questions you have with your health care provider. Document Revised: 01/17/2019 Document Reviewed: 01/17/2019 [employer] Patient Education  2022 [employer]  

## 2021-05-20 NOTE — Progress Notes (Signed)
ciUrological Symptom Review  Patient is experiencing the following symptoms: Frequent urination Hard to postpone urination Get up at night to urinate Leakage of urine Erection problems (male only)   Review of Systems  Gastrointestinal (upper)  : Negative for upper GI symptoms  Gastrointestinal (lower) : Diarrhea  Constitutional : Weight loss Fatigue  Skin: Negative for skin symptoms  Eyes: Blurred vision  Ear/Nose/Throat : Sinus problems  Hematologic/Lymphatic: Easy bruising  Cardiovascular : Chest pain  Respiratory : Negative for respiratory symptoms  Endocrine: Negative for endocrine symptoms  Musculoskeletal: Joint pain  Neurological: Dizziness  Psychologic: Negative for psychiatric symptoms

## 2021-05-20 NOTE — Progress Notes (Signed)
° °  05/20/21  CC: followup low grade bladder cancer   HPI: Andrew [name] is a 70yo here for followup for low grade bladder cancer. No dysuria or gross heamturia Blood pressure 128/73, pulse 87. NED. A&Ox3.   No respiratory distress   Abd soft, NT, ND Normal phallus with bilateral descended testicles  Cystoscopy Procedure Note  Patient identification was confirmed, informed consent was obtained, and patient was prepped using Betadine solution.  Lidocaine jelly was administered per urethral meatus.     Pre-Procedure: - Inspection reveals a normal caliber ureteral meatus.  Procedure: The flexible cystoscope was introduced without difficulty - No urethral strictures/lesions are present. - Enlarged prostate  - Normal bladder neck - Bilateral ureteral orifices identified - Bladder mucosa  reveals no ulcers, tumors, or lesions - No bladder stones - No trabeculation     Post-Procedure: - Patient tolerated the procedure well  Assessment/ Plan:   Return in about 6 months (around 11/18/2021) for cystoscopy.  Nicolette Bang, MD

## 2021-05-21 DIAGNOSIS — E538 Deficiency of other specified B group vitamins: Secondary | ICD-10-CM | POA: Diagnosis not present

## 2021-06-05 ENCOUNTER — Encounter: Payer: Self-pay | Admitting: Gastroenterology

## 2021-06-05 ENCOUNTER — Ambulatory Visit (AMBULATORY_SURGERY_CENTER): Payer: Medicare Other | Admitting: Gastroenterology

## 2021-06-05 ENCOUNTER — Other Ambulatory Visit: Payer: Self-pay

## 2021-06-05 VITALS — BP 120/72 | HR 75 | Temp 98.2°F | Resp 13 | Ht 76.0 in | Wt 191.0 lb

## 2021-06-05 DIAGNOSIS — D122 Benign neoplasm of ascending colon: Secondary | ICD-10-CM

## 2021-06-05 DIAGNOSIS — K2970 Gastritis, unspecified, without bleeding: Secondary | ICD-10-CM | POA: Diagnosis not present

## 2021-06-05 DIAGNOSIS — D124 Benign neoplasm of descending colon: Secondary | ICD-10-CM | POA: Diagnosis not present

## 2021-06-05 DIAGNOSIS — R197 Diarrhea, unspecified: Secondary | ICD-10-CM

## 2021-06-05 DIAGNOSIS — K2270 Barrett's esophagus without dysplasia: Secondary | ICD-10-CM

## 2021-06-05 DIAGNOSIS — K3189 Other diseases of stomach and duodenum: Secondary | ICD-10-CM

## 2021-06-05 DIAGNOSIS — D123 Benign neoplasm of transverse colon: Secondary | ICD-10-CM | POA: Diagnosis not present

## 2021-06-05 DIAGNOSIS — K219 Gastro-esophageal reflux disease without esophagitis: Secondary | ICD-10-CM | POA: Diagnosis not present

## 2021-06-05 DIAGNOSIS — K648 Other hemorrhoids: Secondary | ICD-10-CM

## 2021-06-05 DIAGNOSIS — K5730 Diverticulosis of large intestine without perforation or abscess without bleeding: Secondary | ICD-10-CM

## 2021-06-05 DIAGNOSIS — K449 Diaphragmatic hernia without obstruction or gangrene: Secondary | ICD-10-CM | POA: Diagnosis not present

## 2021-06-05 DIAGNOSIS — D125 Benign neoplasm of sigmoid colon: Secondary | ICD-10-CM

## 2021-06-05 DIAGNOSIS — R194 Change in bowel habit: Secondary | ICD-10-CM

## 2021-06-05 DIAGNOSIS — Z86010 Personal history of colonic polyps: Secondary | ICD-10-CM

## 2021-06-05 MED ORDER — SODIUM CHLORIDE 0.9 % IV SOLN: 500.0000 mL | Freq: Once | INTRAVENOUS | Status: DC

## 2021-06-05 MED ORDER — PANTOPRAZOLE SODIUM 40 MG PO TBEC: 40.0000 mg | DELAYED_RELEASE_TABLET | Freq: Two times a day (BID) | ORAL | 1 refills | Status: DC

## 2021-06-05 NOTE — Op Note (Signed)
[facility] Patient Name: Andrew [name] Procedure Date: 06/05/2021 12:56 PM MRN: [id number] Endoscopist: Remo Lipps P. Havery Moros , MD Age: 71 Referring MD:  Date of Birth: 1951/03/12 Gender: Male Account #: [id number] Procedure:                Upper GI endoscopy Indications:              Follow-up of gastro-esophageal reflux disease -                            rule out Barrett's - on protonix once daily Medicines:                Monitored Anesthesia Care Procedure:                Pre-Anesthesia Assessment:                           - Prior to the procedure, a History and Physical                            was performed, and patient medications and                            allergies were reviewed. The patient's tolerance of                            previous anesthesia was also reviewed. The risks                            and benefits of the procedure and the sedation                            options and risks were discussed with the patient.                            All questions were answered, and informed consent                            was obtained. Prior Anticoagulants: The patient has                            taken Plavix (clopidogrel), last dose was 5 days                            prior to procedure. ASA Grade Assessment: III - A                            patient with severe systemic disease. After                            reviewing the risks and benefits, the patient was                            deemed in satisfactory condition to undergo the  procedure.                           After obtaining informed consent, the endoscope was                            passed under direct vision. Throughout the                            procedure, the patient's blood pressure, pulse, and                            oxygen saturations were monitored continuously. The                            GIF [id number] #[id number] was introduced through  the                            mouth, and advanced to the second part of duodenum.                            The upper GI endoscopy was accomplished without                            difficulty. The patient tolerated the procedure                            well. Scope In: Scope Out: Findings:                 Esophagogastric landmarks were identified: the                            Z-line was found at 36 cm, the gastroesophageal                            junction was found at 38 cm and the upper extent of                            the gastric folds was found at 40 cm from the                            incisors.                           A 2 cm hiatal hernia was present.                           There were esophageal mucosal changes classified as                            Barrett's stage C0-M2 per Prague criteria present                            in the lower third of the esophagus (one 2  cm                            tongue and multiple small islands of salmon colored                            mucosa). The maximum longitudinal extent of these                            mucosal changes was 2 cm in length. Biopsies were                            taken with a cold forceps for histology.                           A single white plaque was found in the lower third                            of the esophagus, 35 cm from the incisors, could                            not be lavaged off. Biopsies were taken with a cold                            forceps for histology.                           The exam of the esophagus was otherwise normal.                           Patchy moderate inflammation characterized by                            erosions, erythema and friability was found in the                            gastric antrum.                           The exam of the stomach was otherwise normal.                           Biopsies were taken with a cold forceps in the                             gastric body, at the incisura and in the gastric                            antrum for Helicobacter pylori testing.                           The duodenal bulb and second portion of the  duodenum were normal. Complications:            No immediate complications. Estimated blood loss:                            Minimal. Estimated Blood Loss:     Estimated blood loss was minimal. Impression:               - Esophagogastric landmarks identified.                           - 2 cm hiatal hernia.                           - Esophageal mucosal changes classified as                            Barrett's stage C0-M2 per Prague criteria. Biopsied.                           - A single plaque in the lower third of the                            esophagus. Biopsied.                           - Gastritis.                           - Normal duodenal bulb and second portion of the                            duodenum.                           - Biopsies were taken with a cold forceps for                            Helicobacter pylori testing. Recommendation:           - Patient has a contact number available for                            emergencies. The signs and symptoms of potential                            delayed complications were discussed with the                            patient. Return to normal activities tomorrow.                            Written discharge instructions were provided to the                            patient.                           -  Resume previous diet.                           - Continue present medications.                           - Resume Plavix tomorrow                           - Increase protonix to twice daily for 6 weeks                            given gastritis noted on the exam                           - Avoid NSAIDs                           - Await pathology results. Remo Lipps P. Aarin Sparkman, MD 06/05/2021  2:31:45 PM This report has been signed electronically.

## 2021-06-05 NOTE — Progress Notes (Signed)
Sedate, gd SR, tolerated procedure well, VSS, report to RN 

## 2021-06-05 NOTE — Progress Notes (Signed)
Called to room to assist during endoscopic procedure.  Patient ID and intended procedure confirmed with present staff. Received instructions for my participation in the procedure from the performing physician.  

## 2021-06-05 NOTE — Patient Instructions (Signed)
Handouts on polyps and gastritis given.  Resume Plavix tomorrow.  Increase Protonix to twice daily for 6 weeks.  YOU HAD AN ENDOSCOPIC PROCEDURE TODAY AT [facility]:   Refer to the procedure report that was given to you for any specific questions about what was found during the examination.  If the procedure report does not answer your questions, please call your gastroenterologist to clarify.  If you requested that your care partner not be given the details of your procedure findings, then the procedure report has been included in a sealed envelope for you to review at your convenience later.  YOU SHOULD EXPECT: Some feelings of bloating in the abdomen. Passage of more gas than usual.  Walking can help get rid of the air that was put into your GI tract during the procedure and reduce the bloating. If you had a lower endoscopy (such as a colonoscopy or flexible sigmoidoscopy) you may notice spotting of blood in your stool or on the toilet paper. If you underwent a bowel prep for your procedure, you may not have a normal bowel movement for a few days.  Please Note:  You might notice some irritation and congestion in your nose or some drainage.  This is from the oxygen used during your procedure.  There is no need for concern and it should clear up in a day or so.  SYMPTOMS TO REPORT IMMEDIATELY:  Following lower endoscopy (colonoscopy or flexible sigmoidoscopy):  Excessive amounts of blood in the stool  Significant tenderness or worsening of abdominal pains  Swelling of the abdomen that is new, acute  Fever of 100F or higher  Following upper endoscopy (EGD)  Vomiting of blood or coffee ground material  New chest pain or pain under the shoulder blades  Painful or persistently difficult swallowing  New shortness of breath  Fever of 100F or higher  Black, tarry-looking stools  For urgent or emergent issues, a gastroenterologist can be reached at any hour by calling [phone]  [phone]. Do not use MyChart messaging for urgent concerns.    DIET:  We do recommend a small meal at first, but then you may proceed to your regular diet.  Drink plenty of fluids but you should avoid alcoholic beverages for 24 hours.  ACTIVITY:  You should plan to take it easy for the rest of today and you should NOT DRIVE or use heavy machinery until tomorrow (because of the sedation medicines used during the test).    FOLLOW UP: Our staff will call the number listed on your records 48-72 hours following your procedure to check on you and address any questions or concerns that you may have regarding the information given to you following your procedure. If we do not reach you, we will leave a message.  We will attempt to reach you two times.  During this call, we will ask if you have developed any symptoms of COVID 19. If you develop any symptoms (ie: fever, flu-like symptoms, shortness of breath, cough etc.) before then, please call [phone].  If you test positive for Covid 19 in the 2 weeks post procedure, please call and report this information to [place].    If any biopsies were taken you will be contacted by phone or by letter within the next 1-3 weeks.  Please call us at [phone] if you have not heard about the biopsies in 3 weeks.    SIGNATURES/CONFIDENTIALITY: You and/or your care partner have signed paperwork which will be entered  into your electronic medical record.  These signatures attest to the fact that that the information above on your After Visit Summary has been reviewed and is understood.  Full responsibility of the confidentiality of this discharge information lies with you and/or your care-partner.

## 2021-06-05 NOTE — Op Note (Signed)
[facility] Patient Name: Andrew [name] Procedure Date: 06/05/2021 12:56 PM MRN: [id number] Endoscopist: Remo Lipps P. Havery Moros , MD Age: 71 Referring MD:  Date of Birth: June 10, 1950 Gender: Male Account #: [id number] Procedure:                Colonoscopy Indications:              High risk colon cancer surveillance: Personal                            history of colonic polyps - 7 polyps removed                            09/2017, also with intermittent loose stools Medicines:                Monitored Anesthesia Care Procedure:                Pre-Anesthesia Assessment:                           - Prior to the procedure, a History and Physical                            was performed, and patient medications and                            allergies were reviewed. The patient's tolerance of                            previous anesthesia was also reviewed. The risks                            and benefits of the procedure and the sedation                            options and risks were discussed with the patient.                            All questions were answered, and informed consent                            was obtained. Prior Anticoagulants: The patient has                            taken Plavix (clopidogrel), last dose was 5 days                            prior to procedure. ASA Grade Assessment: III - A                            patient with severe systemic disease. After                            reviewing the risks and benefits, the patient was  deemed in satisfactory condition to undergo the                            procedure.                           After obtaining informed consent, the colonoscope                            was passed under direct vision. Throughout the                            procedure, the patient's blood pressure, pulse, and                            oxygen saturations were monitored continuously. The                             CF [id number] #[id number] was introduced through the anus                            and advanced to the the cecum, identified by                            appendiceal orifice and ileocecal valve. The                            colonoscopy was performed without difficulty. The                            patient tolerated the procedure well. The quality                            of the bowel preparation was initially fair and                            lavaged to become adequate. The ileocecal valve,                            appendiceal orifice, and rectum were photographed. Scope In: 1:37:15 PM Scope Out: 2:17:57 PM Scope Withdrawal Time: 0 hours 24 minutes 19 seconds  Total Procedure Duration: 0 hours 40 minutes 42 seconds  Findings:                 The perianal and digital rectal examinations were                            normal.                           Four sessile polyps were found in the ascending                            colon. The polyps were 3 to 7 mm in size. These  polyps were removed with a cold snare. Resection                            and retrieval were complete.                           Two sessile polyps were found in the transverse                            colon. The polyps were 5 to 10 mm in size. These                            polyps were removed with a cold snare. Resection                            and retrieval were complete.                           Four sessile polyps were found in the descending                            colon. The polyps were 4 to 8 mm in size. These                            polyps were removed with a cold snare. Resection                            and retrieval were complete.                           Three sessile polyps were found in the sigmoid                            colon. The polyps were 3 to 5 mm in size. These                            polyps were removed with a cold  snare. Resection                            and retrieval were complete.                           A few small-mouthed diverticula were found in the                            left colon.                           Internal hemorrhoids were found during                            retroflexion. The hemorrhoids were moderate.  The exam was otherwise without abnormality. Of                            note, prep was adequate but took several minutes to                            lavage the colon to achieve adequate views and                            prolonged the exam.                           Biopsies for histology were taken with a cold                            forceps for evaluation of microscopic colitis. Complications:            No immediate complications. Estimated blood loss:                            Minimal. Estimated Blood Loss:     Estimated blood loss was minimal. Impression:               - Four 3 to 7 mm polyps in the ascending colon,                            removed with a cold snare. Resected and retrieved.                           - Two 5 to 10 mm polyps in the transverse colon,                            removed with a cold snare. Resected and retrieved.                           - Four 4 to 8 mm polyps in the descending colon,                            removed with a cold snare. Resected and retrieved.                           - Three 3 to 5 mm polyps in the sigmoid colon,                            removed with a cold snare. Resected and retrieved.                           - Diverticulosis in the left colon.                           - Internal hemorrhoids.                           - The examination  was otherwise normal.                           - Biopsies were taken with a cold forceps for                            evaluation of microscopic colitis. Recommendation:           - Patient has a contact number available for                             emergencies. The signs and symptoms of potential                            delayed complications were discussed with the                            patient. Return to normal activities tomorrow.                            Written discharge instructions were provided to the                            patient.                           - Resume previous diet.                           - Continue present medications.                           - Resume Plavix tomorrow                           - Await pathology results.                           - Recommend double prep for the patient's next                            colonoscopy - likely one year, given burden of                            polyps on this exam Quintella Mura P. Havery Moros, MD 06/05/2021 2:25:53 PM This report has been signed electronically.

## 2021-06-05 NOTE — Progress Notes (Signed)
[facility] History and Physical   Primary Care Physician:  Jolinda Croak, MD   Reason for Procedure:   GERD, history of colon polyps, altered bowel habits  Plan:    EGD and colonoscopy     HPI: Andrew [name] is a 71 y.o. male  here for colonoscopy surveillance - multiple polyps removed 09/2017, also with some altered bowels - loose stools occasional. He has chronic GERD, EGD to screen for Barrett's. No family history of colon cancer known. Otherwise feels well without any cardiopulmonary symptoms. Last dose Plavix 5 days ago.   Past Medical History:  Diagnosis Date   Acute viral pericarditis 09/03/2018   Inferior STE - but Negative Troponin.  CP &SOB.  Felt to be Viral.   Anemia    Arthritis    Bladder cancer ([place]) 2014   CAD (coronary artery disease)    Cataract    bilateral   Depression    Diabetes mellitus without complication ([place])    Essential hypertension 01/20/2018   in the past no longer on medication   GERD (gastroesophageal reflux disease)    Hx of adenomatous colonic polyps 09/2017   colonoscopy   Hyperlipidemia with target LDL less than 70 12/19/2015   Intraoperative floppy iris syndrome (IFIS) 07/2019   Multivessel CAD - CTO dLAD, mCx. DES PCI RI, PTCA of RPDA 01/21/2018   12/2017 - Cath for ? STEMI -> distal/apical LAD & m-dCx CTO (unable to cross Cx).  Mod rPDA. Severe RI - DES PCI.  08/2018 - Cath for ? Inf STEMI - RI stent patent & CTO dLAD/mCx. Progression of rPDA 95% -> PTCA only.  Thought to be Pericarditis & not MI (troponin negative).    Neuromuscular disorder ([facility])    Sleep apnea    BiPAP not currently being used   Status post tendon repair 1989   STEMI (ST elevation myocardial infarction) ([place]) 01/20/2018   Cardiac cath January 21, 2018: Severe multivessel disease with unclear lesion, but opted for PCI/DES x1 to the RI.  Appeared to have CTO of the distal LAD and circumflex as well as moderate mid LAD and diagonal disease as well as  PDA.Marland Kitchen  Unable to cross circumflex lesion.   Stroke ([place]) 11/2015   At times pt has dizziness with loss of vision   Tremors of nervous system 2011    Past Surgical History:  Procedure Laterality Date   APPENDECTOMY     BLADDER SURGERY     COLONOSCOPY     CORONARY STENT INTERVENTION N/A 01/21/2018   Procedure: CORONARY STENT INTERVENTION;  Surgeon: Leonie Man, MD;  Location: [facility];  Service: Cardiovascular;  Laterality: N/A;  95% Ramus Intermedius -PCI with synergy DES 2.25 mm x 12 mm postdilated 2.4 mm.   CORONARY/GRAFT ACUTE MI REVASCULARIZATION N/A 01/21/2018   Procedure: Coronary/Graft Acute MI Revascularization;  Surgeon: Leonie Man, MD;  Location: [facility];  Service: Cardiovascular;  Laterality: N/A;  attempted revas to distal CFX;    CORONARY/GRAFT ACUTE MI REVASCULARIZATION N/A 09/04/2018   Procedure: Coronary/Graft Acute MI Revascularization;  Surgeon: Sherren Mocha, MD;  Location: [facility]:: PTCA/POBA of rPDA (2.0 mm balloon)   CYSTOSCOPY N/A 01/21/2020   Procedure: CYSTOSCOPY;  Surgeon: Cleon Gustin, MD;  Location: [facility];  Service: Urology;  Laterality: N/A;   CYSTOSCOPY N/A 02/09/2021   Procedure: CYSTOSCOPY;  Surgeon: Cleon Gustin, MD;  Location: [facility];  Service: Urology;  Laterality: N/A;   CYSTOSCOPY  W/ RETROGRADES Bilateral 08/22/2015   Procedure: CYSTOSCOPY WITH RETROGRADE PYELOGRAM;  Surgeon: Irine Seal, MD;  Location: [facility];  Service: Urology;  Laterality: Bilateral;   CYSTOSCOPY W/ RETROGRADES Bilateral 01/16/2016   Procedure: CYSTOSCOPY WITH RETROGRADE PYELOGRAM;  Surgeon: Irine Seal, MD;  Location: [facility];  Service: Urology;  Laterality: Bilateral;   CYSTOSCOPY W/ RETROGRADES Bilateral 01/07/2017   Procedure: CYSTOSCOPY WITH RETROGRADE PYELOGRAM;  Surgeon: Irine Seal, MD;  Location: [facility];  Service: Urology;  Laterality: Bilateral;   CYSTOSCOPY WITH BIOPSY N/A 08/22/2015   Procedure: CYSTOSCOPY WITH BLADDER  BIOPSY;  Surgeon: Irine Seal, MD;  [facility]: [facility];  Service: Urology;  Laterality: N/A;   CYSTOSCOPY WITH BIOPSY N/A 01/16/2016   Procedure: CYSTOSCOPY WITH BLADDER BIOPSY;  Surgeon: Irine Seal, MD;  [facility]: [facility];  Service: Urology;  Laterality: N/A;   CYSTOSCOPY WITH FULGERATION N/A 01/16/2016   Procedure: CYSTOSCOPY WITH FULGERATION;  Surgeon: Irine Seal, MD;  [facility];  Service: Urology;  Laterality: N/A;   KNEE ARTHROSCOPY Right 1989   LEFT HEART CATH AND CORONARY ANGIOGRAPHY N/A 01/21/2018   Procedure: LEFT HEART CATH AND CORONARY ANGIOGRAPHY;  Surgeon: Leonie Man, MD;  Location: [facility];  Service: Cardiovascular;; 95% RI-DES PCI.  80% o-pCX (PTCA) -> dCX 80%-100% CTO (unsuccessful PTCA unable to cross distal lesion with balloon.)  100% CTO dLAD. RPDA 80% and 70% as well as P AV 180%, PA V2 60%.  (too small for PCI)   LEFT HEART CATH AND CORONARY ANGIOGRAPHY N/A 09/04/2018   Procedure: LEFT HEART CATH AND CORONARY ANGIOGRAPHY;  Surgeon: Sherren Mocha, MD;  Location: [facility]: (presumed Inferior STEMI) = progression of small rPDA -95% (PTCA only). Patent RI stent. CTO of apical LAD & mCx.   ROTATOR CUFF REPAIR Bilateral 2000   rt. leg fracture surgery     TRANSTHORACIC ECHOCARDIOGRAM  01/21/2018   Mild LVH.  EF 50 and 55%.  Apical inferior hypokinesis.  Mid-apical anterolateral hypokinesis.  Normal diastolic function for age    TRANSTHORACIC ECHOCARDIOGRAM  09/04/2018   -? Inf STEMI vs. Pericarditis:  Mildly reduced EF of 45 to 50%.  Impaired relaxation-GR 1 DD.  Severe hypokinesis of the anterolateral and inferolateral wall.  Small-moderate anterior pericardial effusion.  Moderate aortic sclerosis but no stenosis.   TRANSURETHRAL RESECTION OF BLADDER TUMOR N/A 01/07/2017   Procedure: TRANSURETHRAL RESECTION OF BLADDER TUMOR (TURBT);  Surgeon: Irine Seal, MD;  Location: [facility];  Service: Urology;  Laterality: N/A;   TRANSURETHRAL RESECTION OF BLADDER TUMOR  N/A 01/21/2020   Procedure: TRANSURETHRAL RESECTION OF BLADDER TUMOR (TURBT);  Surgeon: Cleon Gustin, MD;  Location: [facility];  Service: Urology;  Laterality: N/A;   TRANSURETHRAL RESECTION OF BLADDER TUMOR N/A 02/09/2021   Procedure: TRANSURETHRAL RESECTION OF BLADDER TUMOR (TURBT);  Surgeon: Cleon Gustin, MD;  Location: [facility];  Service: Urology;  Laterality: N/A;   WISDOM TOOTH EXTRACTION      Prior to Admission medications   Medication Sig Start Date End Date Taking? Authorizing Provider  clopidogrel (PLAVIX) 75 MG tablet Take 75 mg by mouth once. 03/04/20  Yes [provider]  empagliflozin (JARDIANCE) 25 MG TABS tablet Take 25 mg by mouth daily. 05/02/19  Yes Dettinger, Fransisca Kaufmann, MD  fenofibrate (TRICOR) 48 MG tablet Take 1 tablet (48 mg total) by mouth daily. 05/02/19  Yes Dettinger, Fransisca Kaufmann, MD  Insulin Glargine (BASAGLAR KWIKPEN) 100 UNIT/ML Inject 12 Units into the skin daily. 04/21/20  Yes [provider]  isosorbide mononitrate (IMDUR) 30 MG 24 hr tablet Take 1 tablet (30 mg total) by mouth daily. Patient taking differently: Take 15 mg by mouth every evening. 08/09/19  Yes Leonie Man, MD  metFORMIN (GLUCOPHAGE) 1000 MG tablet Take 1 tablet by mouth 2 times daily with a meal. Patient taking differently: Take 1,000 mg by mouth in the morning and at bedtime. Late night & Afternoon 11/19/19  Yes Dettinger, Fransisca Kaufmann, MD  [facility] VERIO test strip daily. 04/01/21  Yes [provider]  pantoprazole (PROTONIX) 40 MG tablet Take 1 tablet (40 mg total) by mouth daily. 12/18/19  Yes Dettinger, Fransisca Kaufmann, MD  primidone (MYSOLINE) 50 MG tablet Take 1 tablet (50 mg total) by mouth 4 (four) times daily. Patient taking differently: Take 100 mg by mouth 4 (four) times daily. 11/22/19  Yes Dettinger, Fransisca Kaufmann, MD  propranolol (INDERAL) 20 MG tablet Take 20 mg by mouth 2 (two) times daily.   Yes [provider]  rosuvastatin (CRESTOR) 40 MG tablet Take 1  tablet (40 mg total) by mouth daily. 11/19/19  Yes Dettinger, Fransisca Kaufmann, MD  sitaGLIPtin (JANUVIA) 100 MG tablet Take 100 mg by mouth daily.  12/18/19  Yes [provider]  tamsulosin (FLOMAX) 0.4 MG CAPS capsule Take 1 capsule (0.4 mg total) by mouth daily. 05/02/19  Yes Dettinger, Fransisca Kaufmann, MD  Carboxymethylcellul-Glycerin (LUBRICATING EYE DROPS OP) Place 1 drop into both eyes daily as needed (dry eyes).    [provider]  Inulin (FIBER CHOICE FRUITY BITES) 1.5 g CHEW Take daily as directed Patient not taking: Reported on 06/05/2021 04/02/21   Astrid Vides, Carlota Raspberry, MD  ketoconazole (NIZORAL) 2 % cream Apply 1 application topically daily as needed for irritation. 01/10/20   [provider]  nitroGLYCERIN (NITROSTAT) 0.4 MG SL tablet PLACE 1 TABLET UNDER THE TONGUE AT ONSET OF CHEST PAIN EVERY 5 MINTUES UP TO 3 TIMES AS NEEDED 04/20/21   Leonie Man, MD  oxyCODONE-acetaminophen (PERCOCET) 10-325 MG tablet Take 2 tablets by mouth daily as needed for pain. 02/09/21   Cleon Gustin, MD    Current Outpatient Medications  Medication Sig Dispense Refill   clopidogrel (PLAVIX) 75 MG tablet Take 75 mg by mouth once.     empagliflozin (JARDIANCE) 25 MG TABS tablet Take 25 mg by mouth daily. 90 mg 3   fenofibrate (TRICOR) 48 MG tablet Take 1 tablet (48 mg total) by mouth daily. 90 tablet 3   Insulin Glargine (BASAGLAR KWIKPEN) 100 UNIT/ML Inject 12 Units into the skin daily.     isosorbide mononitrate (IMDUR) 30 MG 24 hr tablet Take 1 tablet (30 mg total) by mouth daily. (Patient taking differently: Take 15 mg by mouth every evening.) 90 tablet 3   metFORMIN (GLUCOPHAGE) 1000 MG tablet Take 1 tablet by mouth 2 times daily with a meal. (Patient taking differently: Take 1,000 mg by mouth in the morning and at bedtime. Late night & Afternoon) 180 tablet 3   ONETOUCH VERIO test strip daily.     pantoprazole (PROTONIX) 40 MG tablet Take 1 tablet (40 mg total) by mouth daily. 90  tablet 1   primidone (MYSOLINE) 50 MG tablet Take 1 tablet (50 mg total) by mouth 4 (four) times daily. (Patient taking differently: Take 100 mg by mouth 4 (four) times daily.) 360 tablet 3   propranolol (INDERAL) 20 MG tablet Take 20 mg by mouth 2 (two) times daily.     rosuvastatin (CRESTOR) 40 MG  tablet Take 1 tablet (40 mg total) by mouth daily. 90 tablet 3   sitaGLIPtin (JANUVIA) 100 MG tablet Take 100 mg by mouth daily.      tamsulosin (FLOMAX) 0.4 MG CAPS capsule Take 1 capsule (0.4 mg total) by mouth daily. 90 capsule 3   Carboxymethylcellul-Glycerin (LUBRICATING EYE DROPS OP) Place 1 drop into both eyes daily as needed (dry eyes).     Inulin (FIBER CHOICE FRUITY BITES) 1.5 g CHEW Take daily as directed (Patient not taking: Reported on 06/05/2021)     ketoconazole (NIZORAL) 2 % cream Apply 1 application topically daily as needed for irritation.     nitroGLYCERIN (NITROSTAT) 0.4 MG SL tablet PLACE 1 TABLET UNDER THE TONGUE AT ONSET OF CHEST PAIN EVERY 5 MINTUES UP TO 3 TIMES AS NEEDED 25 tablet 0   oxyCODONE-acetaminophen (PERCOCET) 10-325 MG tablet Take 2 tablets by mouth daily as needed for pain. 15 tablet 0   Current Facility-Administered Medications  Medication Dose Route Frequency Provider Last Rate Last Admin   0.9 %  sodium chloride infusion  500 mL Intravenous Once Boaz Berisha, Carlota Raspberry, MD        Allergies as of 06/05/2021 - Review Complete 06/05/2021  Allergen Reaction Noted   Tramadol Shortness Of Breath 86/57/8469   Trulicity [dulaglutide] Swelling 02/16/2016    Family History  Problem Relation Age of Onset   Diabetes Mother 47       type 1   Stroke Mother    Dementia Father    Diabetes Brother    Heart attack Brother 9   Testicular cancer Brother    Diabetes Brother    Cancer Brother        testicular   Colon cancer Neg Hx    Colon polyps Neg Hx    Esophageal cancer Neg Hx    Rectal cancer Neg Hx    Stomach cancer Neg Hx    Pancreatic cancer Neg Hx      Social History   Socioeconomic History   Marital status: Single    Spouse name: Not on file   Number of children: 1   Years of education: some college   Highest education level: Some college, no degree  Occupational History   Occupation: retired    Comment: Architect, some farming  Tobacco Use   Smoking status: Every Day    Packs/day: 1.00    Years: 50.00    Pack years: 50.00    Types: Cigarettes   Smokeless tobacco: Former  Scientific laboratory technician Use: Never used  Substance and Sexual Activity   Alcohol use: No    Comment: rarely   Drug use: No   Sexual activity: Never  Other Topics Concern   Not on file  Social History Narrative   Herron is retired and lives alone. He has a daughter that lives locally that he sees regularly. He has a couple of horses and an outside dog. He enjoys working outside and taking care of his horses.    Social Determinants of Health   Financial Resource Strain: Not on file  Food Insecurity: Not on file  Transportation Needs: Not on file  Physical Activity: Not on file  Stress: Not on file  Social Connections: Not on file  Intimate Partner Violence: Not on file    Review of Systems: All other review of systems negative except as mentioned in the HPI.  Physical Exam: Vital signs BP 108/63    Pulse 86    Temp 98.2  F (36.8 C) (Skin)    Ht 6\' 4"  (1.93 m)    Wt 191 lb (86.6 kg)    SpO2 96%    BMI 23.25 kg/m   General:   Alert,  Well-developed, pleasant and cooperative in NAD Lungs:  Clear throughout to auscultation.   Heart:  Regular rate and rhythm Abdomen:  Soft, nontender and nondistended.   Neuro/Psych:  Alert and cooperative. Normal mood and affect. A and O x 3  Jolly Mango, MD [facility]

## 2021-06-05 NOTE — Progress Notes (Signed)
VS by CW. ?

## 2021-06-09 ENCOUNTER — Telehealth: Payer: Self-pay | Admitting: *Deleted

## 2021-06-09 NOTE — Telephone Encounter (Signed)
°  Follow up Call-  Call back number 06/05/2021  Post procedure Call Back phone  # [phone]  Permission to leave phone message Yes  Some recent data might be hidden     Patient questions:  Do you have a fever, pain , or abdominal swelling? No. Pain Score  0 *  Have you tolerated food without any problems? Yes.    Have you been able to return to your normal activities? Yes.    Do you have any questions about your discharge instructions: Diet   No. Medications  No. Follow up visit  No.  Do you have questions or concerns about your Care? No.  Actions: * If pain score is 4 or above: No action needed, pain <4.  Have you developed a fever since your procedure? no  2.   Have you had an respiratory symptoms (SOB or cough) since your procedure? no  3.   Have you tested positive for COVID 19 since your procedure no  4.   Have you had any family members/close contacts diagnosed with the COVID 19 since your procedure?  no   If yes to any of these questions please route to Joylene John, RN and Joella Prince, RN

## 2021-07-03 ENCOUNTER — Telehealth: Payer: Self-pay | Admitting: Gastroenterology

## 2021-07-03 ENCOUNTER — Other Ambulatory Visit: Payer: Self-pay

## 2021-07-03 DIAGNOSIS — D125 Benign neoplasm of sigmoid colon: Secondary | ICD-10-CM

## 2021-07-03 DIAGNOSIS — Z860100 Personal history of colon polyps, unspecified: Secondary | ICD-10-CM

## 2021-07-03 DIAGNOSIS — D123 Benign neoplasm of transverse colon: Secondary | ICD-10-CM

## 2021-07-03 DIAGNOSIS — K219 Gastro-esophageal reflux disease without esophagitis: Secondary | ICD-10-CM

## 2021-07-03 DIAGNOSIS — D122 Benign neoplasm of ascending colon: Secondary | ICD-10-CM

## 2021-07-03 DIAGNOSIS — R194 Change in bowel habit: Secondary | ICD-10-CM

## 2021-07-03 DIAGNOSIS — D124 Benign neoplasm of descending colon: Secondary | ICD-10-CM

## 2021-07-03 MED ORDER — PANTOPRAZOLE SODIUM 40 MG PO TBEC: 40.0000 mg | DELAYED_RELEASE_TABLET | Freq: Two times a day (BID) | ORAL | 1 refills | Status: DC

## 2021-07-03 NOTE — Telephone Encounter (Signed)
Inbound call from patient states that he needs a refill for Protonix. Please advise.

## 2021-07-03 NOTE — Telephone Encounter (Signed)
Refill sent to pharmacy. Patient has upcoming appointment in Feb

## 2021-07-20 ENCOUNTER — Encounter: Payer: Self-pay | Admitting: Gastroenterology

## 2021-07-20 ENCOUNTER — Ambulatory Visit: Payer: Medicare Other | Admitting: Gastroenterology

## 2021-07-20 VITALS — BP 124/58 | HR 80 | Ht 76.0 in | Wt 197.0 lb

## 2021-07-20 DIAGNOSIS — K2270 Barrett's esophagus without dysplasia: Secondary | ICD-10-CM

## 2021-07-20 DIAGNOSIS — R194 Change in bowel habit: Secondary | ICD-10-CM

## 2021-07-20 DIAGNOSIS — Z86010 Personal history of colonic polyps: Secondary | ICD-10-CM | POA: Diagnosis not present

## 2021-07-20 DIAGNOSIS — K2289 Other specified disease of esophagus: Secondary | ICD-10-CM

## 2021-07-20 MED ORDER — PANTOPRAZOLE SODIUM 40 MG PO TBEC: 40.0000 mg | DELAYED_RELEASE_TABLET | Freq: Every day | ORAL | 5 refills | Status: DC

## 2021-07-20 NOTE — Patient Instructions (Addendum)
If you are age 72 or older, your body mass index should be between 23-30. Your Body mass index is 23.98 kg/m. If this is out of the aforementioned range listed, please consider follow up with your Primary Care Provider.  If you are age 85 or younger, your body mass index should be between 19-25. Your Body mass index is 23.98 kg/m. If this is out of the aformentioned range listed, please consider follow up with your Primary Care Provider.   ________________________________________________________  The [facility] providers would like to encourage you to use [facility] to communicate with providers for non-urgent requests or questions.  Due to long hold times on the telephone, sending your provider a message by [facility] may be a faster and more efficient way to get a response.  Please allow 48 business hours for a response.  Please remember that this is for non-urgent requests.  _______________________________________________________  Take Protonix 40 mg: Take once daily  Please taek a daily Fiber Supplement.  We have given you samples of the following medication to try: Fiber Choice  You can also try Citrucel, over-the-counter  You will be due for a colonoscopy 05-2022.  We will review your EGD recall in 6 months.  Please advise your daughter she needs to have an colonoscopy.  Thank you for entrusting me with your care and for choosing [facility], Dr. [place]

## 2021-07-20 NOTE — Progress Notes (Signed)
HPI :  71 year old male here for follow-up visit for colon polyps, newly diagnosed Barrett's and mucosal abnormality of the esophagus.  Recall he also has a history of bladder cancer, coronary artery disease on Plavix, tobacco use,   Recall that colonoscopy May 2019 showed 7 polyps removed, the largest was 1 cm in size.  Most adenomas and sessile serrated polyps.  He then had a colonoscopy with me in January in which 13 polyps removed, majority adenomas, sessile serrated polyps.  Random biopsies were also taken due to intermittent loose stools.  This showed no evidence of microscopic colitis.  He denies any family history of colon cancer.  He has a 39 year old daughter, he is not sure if she ever had a prior colonoscopy and 2 brothers.  He had been offered genetic testing but wanted to talk to me about that first.  Before the colonoscopy he was having some loose stools more so, now its more so intermittent, has altered bowels with sometimes loose stools, sometimes hard stools.  We had discussed using a fiber supplement which she has not tried.  Recall he otherwise has had longstanding GERD since he was a child, has been on Protonix 40 mg once daily for some time and generally works well for him.  He had an EGD with me in January showing a 2 cm segment of Barrett's esophagus without dysplasia.  He also was noted to have gastritis, and a mucosal abnormality of the esophagus at 35 cm from the incisors..  Pathology results as outlined below.  No evidence of H. 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  He continues to smoke cigarettes daily, roughly 1 pack/day he has been taking Protonix for twice daily since the EGD for 6 weeks to treat the gastritis, trying to avoid NSAIDs.  He has since not had any heartburn that is bothersome but states the 40 mg once daily dose is really no different from the twice daily dose in regards to how he is feeling.    Prior work-up:   Colonoscopy 10/14/2017 -  - One 7 mm polyp in the cecum, removed with a cold snare. Resected and retrieved. - Two 8 to 10 mm polyps in the ascending colon, removed with a cold snare. Resected and retrieved. - One 5 mm polyp in the sigmoid colon, removed with a cold snare. Resected and retrieved. - Two 3 to 5 mm polyps in the sigmoid colon, removed with a cold snare. Resected and retrieved. - One 7 mm polyp in the rectum, removed with a hot snare. Resected and retrieved. - Internal hemorrhoids. - The examination was otherwise normal. - Several minutes spent lavaging the colon to achieve adequate views   1. Surgical [P], rectosigmoid, sigmoid, ascending, and cecum, polyp (6) - TUBULAR ADENOMA. - MULTIPLE FRAGMENTS OF SESSILE SERRATED POLYP WITHOUT DYSPLASIA. - MULTIPLE FRAGMENTS OF HYPERPLASTIC POLYP. - NO HIGH GRADE DYSPLASIA OR MALIGNANCY. 2. Surgical [P], rectum, polyp - LIPOMA. - NO DYSPLASIA OR MALIGNANCY.     Echo 09/04/2018 - EF 45-50%   Cardiac cath 09/04/18 - 1.  Multivessel coronary artery disease with chronic occlusion of the apical LAD and mid circumflex, continued patency of the stented segment in the ramus intermedius, and severe progressive stenosis of the right PDA 2.  Mild segmental contraction abnormality the left ventricle with preserved overall LVEF estimated at 55 to 60% 3.  Normal LVEDP 4.  Successful balloon angioplasty of the right PDA, treated with p.o. BA because of  small vessel caliber     EGD 06/05/2021 -  - A 2 cm hiatal hernia was present. - There were esophageal mucosal changes classified as Barrett's stage C0-M2 per Prague criteria present in the lower third of the esophagus (one 2 cm tongue and multiple small islands of salmon colored mucosa). The maximum longitudinal extent of these mucosal changes was 2 cm in length. Biopsies were taken with a cold forceps for histology. - A single white plaque was found in the lower third of the esophagus, 35 cm  from the incisors, could not be lavaged off. Biopsies were taken with a cold forceps for histology. - The exam of the esophagus was otherwise normal. - Patchy moderate inflammation characterized by erosions, erythema and friability was found in the gastric antrum. - The exam of the stomach was otherwise normal. - Biopsies were taken with a cold forceps in the gastric body, at the incisura and in the gastric antrum for Helicobacter pylori testing. - The duodenal bulb and second portion of the duodenum were normal.   Colonoscopy 06/04/2021:Four 3 to 7 mm polyps in the ascending colon, removed with a cold snare. Resected and retrieved. - Two 5 to 10 mm polyps in the transverse colon, removed with a cold snare. Resected and retrieved. - Four 4 to 8 mm polyps in the descending colon, removed with a cold snare. Resected and retrieved. - Three 3 to 5 mm polyps in the sigmoid colon, removed with a cold snare. Resected and retrieved. - Diverticulosis in the left colon. - Internal hemorrhoids. - The examination was otherwise normal. - Biopsies were taken with a cold forceps for evaluation of microscopic colitis.  1. Surgical [P], gastric antrum and gastric body - GASTRIC OXYNTIC MUCOSA WITH PARIETAL CELL HYPERPLASIA AS CAN BE SEEN IN HYPERGASTRINEMIC STATES SUCH AS PPI THERAPY. - GASTRIC ANTRAL MUCOSA WITH NO SPECIFIC HISTOPATHOLOGIC CHANGES - HELICOBACTER PYLORI-LIKE ORGANISMS ARE NOT IDENTIFIED ON ROUTINE H&E STAIN 2. Surgical [P], distal esophagus - BARRETTS ESOPHAGUS, NEGATIVE FOR DYSPLASIA 3. Surgical [P], distal esophagus at 35 cm ESOPHAGEAL MUCOSA WITH EPIDERMOID METAPLASIA. SEE NOTE 4. Surgical [P], colon, sigmoid, descending, transverse, and ascending, polyp (13) - TUBULAR ADENOMA(S) WITHOUT HIGH-GRADE DYSPLASIA OR MALIGNANCY - SESSILE SERRATED POLYP(S) WITHOUT CYTOLOGIC DYSPLASIA - HYPERPLASTIC POLYP(S) 5. Surgical [P], colon nos, random sites - COLONIC MUCOSA WITH NO SPECIFIC  HISTOPATHOLOGIC CHANGES - NEGATIVE FOR ACUTE INFLAMMATION, INCREASED INTRAEPITHELIAL LYMPHOCYTES OR THICKENED SUBEPITHELIAL COLLAGEN TABLE  Diagnosis Note 3. Esophageal squamous mucosa shows orthokeratosis and hyperkeratosis. Some studies have shown possible association of epidermoid metaplasia with squamous cell carcinoma.   Past Medical History:  Diagnosis Date   Acute viral pericarditis 09/03/2018   Inferior STE - but Negative Troponin.  CP &SOB.  Felt to be Viral.   Anemia    Arthritis    Bladder cancer ([place]) 2014   CAD (coronary artery disease)    Cataract    bilateral   Depression    Diabetes mellitus without complication ([place])    Essential hypertension 01/20/2018   in the past no longer on medication   GERD (gastroesophageal reflux disease)    Hx of adenomatous colonic polyps 09/2017   colonoscopy   Hyperlipidemia with target LDL less than 70 12/19/2015   Intraoperative floppy iris syndrome (IFIS) 07/2019   Multivessel CAD - CTO dLAD, mCx. DES PCI RI, PTCA of RPDA 01/21/2018   12/2017 - Cath for ? STEMI -> distal/apical LAD & m-dCx CTO (unable to cross Cx).  Mod rPDA. Severe RI -  DES PCI.  08/2018 - Cath for ? Inf STEMI - RI stent patent & CTO dLAD/mCx. Progression of rPDA 95% -> PTCA only.  Thought to be Pericarditis & not MI (troponin negative).    Neuromuscular disorder ([place])    Sleep apnea    BiPAP not currently being used   Status post tendon repair 1989   STEMI (ST elevation myocardial infarction) ([place]) 01/20/2018   Cardiac cath January 21, 2018: Severe multivessel disease with unclear lesion, but opted for PCI/DES x1 to the RI.  Appeared to have CTO of the distal LAD and circumflex as well as moderate mid LAD and diagonal disease as well as PDA.Marland Kitchen  Unable to cross circumflex lesion.   Stroke ([place]) 11/2015   At times pt has dizziness with loss of vision   Tremors of nervous system 2011     Past Surgical History:  Procedure Laterality Date   APPENDECTOMY      BLADDER SURGERY     COLONOSCOPY     CORONARY STENT INTERVENTION N/A 01/21/2018   Procedure: CORONARY STENT INTERVENTION;  Surgeon: Leonie Man, MD;  Location: [facility];  Service: Cardiovascular;  Laterality: N/A;  95% Ramus Intermedius -PCI with synergy DES 2.25 mm x 12 mm postdilated 2.4 mm.   CORONARY/GRAFT ACUTE MI REVASCULARIZATION N/A 01/21/2018   Procedure: Coronary/Graft Acute MI Revascularization;  Surgeon: Leonie Man, MD;  Location: [facility];  Service: Cardiovascular;  Laterality: N/A;  attempted revas to distal CFX;    CORONARY/GRAFT ACUTE MI REVASCULARIZATION N/A 09/04/2018   Procedure: Coronary/Graft Acute MI Revascularization;  Surgeon: Sherren Mocha, MD;  Location: [place] [facility]:: PTCA/POBA of rPDA (2.0 mm balloon)   CYSTOSCOPY N/A 01/21/2020   Procedure: CYSTOSCOPY;  Surgeon: Cleon Gustin, MD;  Location: [facility];  Service: Urology;  Laterality: N/A;   CYSTOSCOPY N/A 02/09/2021   Procedure: CYSTOSCOPY;  Surgeon: Cleon Gustin, MD;  Location: [facility];  Service: Urology;  Laterality: N/A;   CYSTOSCOPY W/ RETROGRADES Bilateral 08/22/2015   Procedure: CYSTOSCOPY WITH RETROGRADE PYELOGRAM;  Surgeon: Irine Seal, MD;  Location: [facility];  Service: Urology;  Laterality: Bilateral;   CYSTOSCOPY W/ RETROGRADES Bilateral 01/16/2016   Procedure: CYSTOSCOPY WITH RETROGRADE PYELOGRAM;  Surgeon: Irine Seal, MD;  Location: [facility];  Service: Urology;  Laterality: Bilateral;   CYSTOSCOPY W/ RETROGRADES Bilateral 01/07/2017   Procedure: CYSTOSCOPY WITH RETROGRADE PYELOGRAM;  Surgeon: Irine Seal, MD;  Location: [facility];  Service: Urology;  Laterality: Bilateral;   CYSTOSCOPY WITH BIOPSY N/A 08/22/2015   Procedure: CYSTOSCOPY WITH BLADDER BIOPSY;  Surgeon: Irine Seal, MD;  [facility]: [facility];  Service: Urology;  Laterality: N/A;   CYSTOSCOPY WITH BIOPSY N/A 01/16/2016   Procedure: CYSTOSCOPY WITH BLADDER BIOPSY;  Surgeon: Irine Seal, MD;  Location: [facility];  Service:  Urology;  Laterality: N/A;   CYSTOSCOPY WITH FULGERATION N/A 01/16/2016   Procedure: CYSTOSCOPY WITH FULGERATION;  Surgeon: Irine Seal, MD;  Location: [facility];  Service: Urology;  Laterality: N/A;   KNEE ARTHROSCOPY Right 1989   LEFT HEART CATH AND CORONARY ANGIOGRAPHY N/A 01/21/2018   Procedure: LEFT HEART CATH AND CORONARY ANGIOGRAPHY;  Surgeon: Leonie Man, MD;  Location: [facility];  Service: Cardiovascular;; 95% RI-DES PCI.  80% o-pCX (PTCA) -> dCX 80%-100% CTO (unsuccessful PTCA unable to cross distal lesion with balloon.)  100% CTO dLAD. RPDA 80% and 70% as well as P AV 180%, PA V2 60%.  (too small for PCI)   LEFT HEART  CATH AND CORONARY ANGIOGRAPHY N/A 09/04/2018   Procedure: LEFT HEART CATH AND CORONARY ANGIOGRAPHY;  Surgeon: Sherren Mocha, MD;  Location: [place] [facility]: (presumed Inferior STEMI) = progression of small rPDA -95% (PTCA only). Patent RI stent. CTO of apical LAD & mCx.   ROTATOR CUFF REPAIR Bilateral 2000   rt. leg fracture surgery     TRANSTHORACIC ECHOCARDIOGRAM  01/21/2018   Mild LVH.  EF 50 and 55%.  Apical inferior hypokinesis.  Mid-apical anterolateral hypokinesis.  Normal diastolic function for age    TRANSTHORACIC ECHOCARDIOGRAM  09/04/2018   -? Inf STEMI vs. Pericarditis:  Mildly reduced EF of 45 to 50%.  Impaired relaxation-GR 1 DD.  Severe hypokinesis of the anterolateral and inferolateral wall.  Small-moderate anterior pericardial effusion.  Moderate aortic sclerosis but no stenosis.   TRANSURETHRAL RESECTION OF BLADDER TUMOR N/A 01/07/2017   Procedure: TRANSURETHRAL RESECTION OF BLADDER TUMOR (TURBT);  Surgeon: Irine Seal, MD;  Location: [facility];  Service: Urology;  Laterality: N/A;   TRANSURETHRAL RESECTION OF BLADDER TUMOR N/A 01/21/2020   Procedure: TRANSURETHRAL RESECTION OF BLADDER TUMOR (TURBT);  Surgeon: Cleon Gustin, MD;  Location: [facility];  Service: Urology;  Laterality: N/A;   TRANSURETHRAL RESECTION OF BLADDER TUMOR N/A 02/09/2021    Procedure: TRANSURETHRAL RESECTION OF BLADDER TUMOR (TURBT);  Surgeon: Cleon Gustin, MD;  Location: [facility];  Service: Urology;  Laterality: N/A;   WISDOM TOOTH EXTRACTION     Family History  Problem Relation Age of Onset   Diabetes Mother 29       type 1   Stroke Mother    Dementia Father    Diabetes Brother    Heart attack Brother 73   Testicular cancer Brother    Diabetes Brother    Cancer Brother        testicular   Colon cancer Neg Hx    Colon polyps Neg Hx    Esophageal cancer Neg Hx    Rectal cancer Neg Hx    Stomach cancer Neg Hx    Pancreatic cancer Neg Hx    Social History   Tobacco Use   Smoking status: Every Day    Packs/day: 1.00    Years: 50.00    Pack years: 50.00    Types: Cigarettes   Smokeless tobacco: Former  Scientific laboratory technician Use: Never used  Substance Use Topics   Alcohol use: No    Comment: rarely   Drug use: No   Current Outpatient Medications  Medication Sig Dispense Refill   Carboxymethylcellul-Glycerin (LUBRICATING EYE DROPS OP) Place 1 drop into both eyes daily as needed (dry eyes).     clopidogrel (PLAVIX) 75 MG tablet Take 75 mg by mouth once.     empagliflozin (JARDIANCE) 25 MG TABS tablet Take 25 mg by mouth daily. 90 mg 3   fenofibrate (TRICOR) 48 MG tablet Take 1 tablet (48 mg total) by mouth daily. 90 tablet 3   Insulin Glargine (BASAGLAR KWIKPEN) 100 UNIT/ML Inject 12 Units into the skin daily.     Inulin (FIBER CHOICE FRUITY BITES) 1.5 g CHEW Take daily as directed     isosorbide mononitrate (IMDUR) 30 MG 24 hr tablet Take 1 tablet (30 mg total) by mouth daily. (Patient taking differently: Take 15 mg by mouth every evening.) 90 tablet 3   ketoconazole (NIZORAL) 2 % cream Apply 1 application topically daily as needed for irritation.     metFORMIN (GLUCOPHAGE) 1000 MG tablet Take 1 tablet by mouth  2 times daily with a meal. (Patient taking differently: Take 1,000 mg by mouth in the morning and at bedtime. Late night &  Afternoon) 180 tablet 3   nitroGLYCERIN (NITROSTAT) 0.4 MG SL tablet PLACE 1 TABLET UNDER THE TONGUE AT ONSET OF CHEST PAIN EVERY 5 MINTUES UP TO 3 TIMES AS NEEDED 25 tablet 0   ONETOUCH VERIO test strip daily.     oxyCODONE-acetaminophen (PERCOCET) 10-325 MG tablet Take 2 tablets by mouth daily as needed for pain. 15 tablet 0   pantoprazole (PROTONIX) 40 MG tablet Take 1 tablet (40 mg total) by mouth 2 (two) times daily. 60 tablet 1   primidone (MYSOLINE) 50 MG tablet Take 1 tablet (50 mg total) by mouth 4 (four) times daily. (Patient taking differently: Take 100 mg by mouth 4 (four) times daily.) 360 tablet 3   propranolol (INDERAL) 20 MG tablet Take 20 mg by mouth 2 (two) times daily.     rosuvastatin (CRESTOR) 40 MG tablet Take 1 tablet (40 mg total) by mouth daily. 90 tablet 3   sitaGLIPtin (JANUVIA) 100 MG tablet Take 100 mg by mouth daily.      tamsulosin (FLOMAX) 0.4 MG CAPS capsule Take 1 capsule (0.4 mg total) by mouth daily. 90 capsule 3   No current facility-administered medications for this visit.   Allergies  Allergen Reactions   Tramadol Shortness Of Breath    And dizziness   Trulicity [Dulaglutide] Swelling    Ankles and feet swell     Review of Systems: All systems reviewed and negative except where noted in HPI.   Lab Results  Component Value Date   WBC 8.1 02/05/2021   HGB 16.3 02/05/2021   HCT 48.8 02/05/2021   MCV 96.8 02/05/2021   PLT 221 02/05/2021    Lab Results  Component Value Date   CREATININE 0.87 02/05/2021   BUN 10 02/05/2021   NA 132 (L) 02/05/2021   K 4.1 02/05/2021   CL 103 02/05/2021   CO2 22 02/05/2021    Lab Results  Component Value Date   ALT 10 11/19/2019   AST 9 11/19/2019   ALKPHOS 66 11/19/2019   BILITOT 0.2 11/19/2019     Physical Exam: BP (!) 124/58    Pulse 80    Ht 6\' 4"  (1.93 m)    Wt 197 lb (89.4 kg)    BMI 23.98 kg/m  Constitutional: Pleasant,well-developed, male in no acute distress. Neurological: Alert and  oriented to person place and time. Psychiatric: Normal mood and affect. Behavior is normal.   ASSESSMENT AND PLAN: 71 year old male here for reassessment of following:  Barrett's esophagus Mucosal abnormality of the esophagus History of colon polyps Altered bowel habits  History as outlined above.  Longstanding GERD for years is short segment of Barrett's esophagus without any high risk lesions his reflux is well controlled on Protonix 40 mg once daily he tolerates it well.  We discussed long-term risks of chronic PPI, recommend he continue given his history of Barrett's I think benefits > risks.  Recommend repeat EGD in 3 to 5 years for reassessment of this area.  Otherwise of note a small esophageal plaque biopsy from the distal esophagus that cannot be washed off.  Returns as epidermoid metaplasia pathology raising question of association to development of squamous cell carcinoma.  We discussed surveillance of this evaluation prior to the next EGD for his Barrett's, may consider another EGD in 6 to 12 months.  We discussed his history of colon polyps,  numerous adenomas in the past, given burden of adenomas I offered him a referral to genetic counseling.  Results of this can influence his brothers or his daughter.  He wants to think about this more but is not inclined to pursue it at this time.  I do recommend a repeat colonoscopy in 1 year.  He should tell his daughter that she should have a colonoscopy now (age 31s) given his history of advanced adenomas and numerous polyps.  Regarding his bowel habits, recommend fiber supplementation once daily to see if this can provide some regularity.  Gave him Fiber Choice capsules to sample, also could use Citrucel OTC.  He agrees with the plan as outlined.  Of note we discussed his tobacco use, he struggles to quit in the past and does not think he can do so at this time.  Plan: - continue Protonix 40mg  / day - recall EGD in roughly 6 months - offered  referral to genetic counselor - he declines - he will relay to his daughter that she seek evaluation for a colonoscopy - repeat colonoscopy in one year - tobacco cessation recommended - trial of daily fiber supplement as outlined  Jolly Mango, MD [facility]

## 2021-11-16 ENCOUNTER — Encounter: Payer: Self-pay | Admitting: Urology

## 2021-11-16 ENCOUNTER — Ambulatory Visit: Payer: Medicare Other | Admitting: Urology

## 2021-11-16 VITALS — BP 106/70 | HR 88

## 2021-11-16 DIAGNOSIS — Z8551 Personal history of malignant neoplasm of bladder: Secondary | ICD-10-CM | POA: Diagnosis not present

## 2021-11-16 DIAGNOSIS — C679 Malignant neoplasm of bladder, unspecified: Secondary | ICD-10-CM

## 2021-11-16 LAB — MICROSCOPIC EXAMINATION
Bacteria, UA: NONE SEEN
RBC, Urine: >30 /hpf — AB (ref 0–2)
Renal Epithel, UA: NONE SEEN /hpf
WBC, UA: NONE SEEN /hpf (ref 0–5)

## 2021-11-16 LAB — URINALYSIS, ROUTINE W REFLEX MICROSCOPIC
Bilirubin, UA: NEGATIVE
Ketones, UA: NEGATIVE
Leukocytes,UA: NEGATIVE
Nitrite, UA: NEGATIVE
Specific Gravity, UA: 1.01 (ref 1.005–1.030)
Urobilinogen, Ur: 0.2 mg/dL (ref 0.2–1.0)
pH, UA: 5 (ref 5.0–7.5)

## 2021-11-16 MED ORDER — CIPROFLOXACIN HCL 500 MG PO TABS
500.0000 mg | ORAL_TABLET | Freq: Once | ORAL | Status: AC
  Administered 2021-11-16: 500 mg via ORAL

## 2021-11-18 ENCOUNTER — Encounter: Payer: Self-pay | Admitting: Gastroenterology

## 2021-11-18 LAB — CYTOLOGY, URINE

## 2021-12-23 ENCOUNTER — Other Ambulatory Visit: Payer: Self-pay | Admitting: Gastroenterology

## 2021-12-23 DIAGNOSIS — Z86010 Personal history of colonic polyps: Secondary | ICD-10-CM

## 2022-03-24 DIAGNOSIS — I219 Acute myocardial infarction, unspecified: Secondary | ICD-10-CM

## 2022-03-24 HISTORY — DX: Acute myocardial infarction, unspecified: I21.9

## 2022-04-28 ENCOUNTER — Ambulatory Visit: Payer: Medicare Other

## 2022-04-29 ENCOUNTER — Other Ambulatory Visit: Payer: Self-pay | Admitting: Gastroenterology

## 2022-04-29 DIAGNOSIS — Z86010 Personal history of colonic polyps: Secondary | ICD-10-CM

## 2022-05-12 ENCOUNTER — Ambulatory Visit: Payer: Medicare Other | Admitting: Urology

## 2022-05-12 VITALS — BP 113/72 | HR 93

## 2022-05-12 DIAGNOSIS — Z8551 Personal history of malignant neoplasm of bladder: Secondary | ICD-10-CM

## 2022-05-12 DIAGNOSIS — C679 Malignant neoplasm of bladder, unspecified: Secondary | ICD-10-CM

## 2022-05-12 LAB — MICROSCOPIC EXAMINATION
Bacteria, UA: NONE SEEN
RBC, Urine: >30 /hpf — AB (ref 0–2)

## 2022-05-12 LAB — URINALYSIS, ROUTINE W REFLEX MICROSCOPIC
Bilirubin, UA: NEGATIVE
Leukocytes,UA: NEGATIVE
Nitrite, UA: NEGATIVE
Specific Gravity, UA: <1.005 — ABNORMAL LOW (ref 1.005–1.030)
Urobilinogen, Ur: 0.2 mg/dL (ref 0.2–1.0)
pH, UA: 5 (ref 5.0–7.5)

## 2022-05-12 MED ORDER — CIPROFLOXACIN HCL 500 MG PO TABS
500.0000 mg | ORAL_TABLET | Freq: Once | ORAL | Status: AC
  Administered 2022-05-12: 500 mg via ORAL

## 2022-05-12 NOTE — Progress Notes (Signed)
   05/12/22  CC: followup bladder cancer  HPI: Mr Coppolino is a 71yo here for followup for bladder cancer Blood pressure 113/72, pulse 93. NED. A&Ox3.   No respiratory distress   Abd soft, NT, ND Normal phallus with bilateral descended testicles  Cystoscopy Procedure Note  Patient identification was confirmed, informed consent was obtained, and patient was prepped using Betadine solution.  Lidocaine jelly was administered per urethral meatus.     Pre-Procedure: - Inspection reveals a normal caliber ureteral meatus.  Procedure: The flexible cystoscope was introduced without difficulty - No urethral strictures/lesions are present. - Enlarged prostate  - Normal bladder neck - Bilateral ureteral orifices identified - Bladder mucosa  reveals no ulcers, tumors, or lesions - No bladder stones - No trabeculation    Post-Procedure: - Patient tolerated the procedure well  Assessment/ Plan: Urine for cytology Renal US in 4-6 weeks  No follow-ups on file.  Nicolette Bang, MD

## 2022-05-13 LAB — CYTOLOGY, URINE

## 2022-05-19 ENCOUNTER — Other Ambulatory Visit: Payer: Medicare Other | Admitting: Urology

## 2022-05-20 ENCOUNTER — Encounter: Payer: Self-pay | Admitting: Urology

## 2022-05-20 NOTE — Patient Instructions (Signed)

## 2022-05-26 ENCOUNTER — Ambulatory Visit (INDEPENDENT_AMBULATORY_CARE_PROVIDER_SITE_OTHER): Payer: Medicare Other | Admitting: Urology

## 2022-05-26 ENCOUNTER — Telehealth: Payer: Self-pay

## 2022-05-26 DIAGNOSIS — N390 Urinary tract infection, site not specified: Secondary | ICD-10-CM

## 2022-05-26 NOTE — Telephone Encounter (Signed)
Made patient aware that pt can come drop off a urine for a FISH test. His urine cytology was inconclusive and Dr, Alyson Ingles wanted another urine test done. Patient voiced understanding

## 2022-05-26 NOTE — Progress Notes (Signed)
FISH Cytology scheduled for pick up for 05/27/2022.  Tracking # [id number] Confirmation code- [id number]

## 2022-06-08 ENCOUNTER — Encounter: Payer: Self-pay | Admitting: Gastroenterology

## 2022-06-21 ENCOUNTER — Ambulatory Visit (HOSPITAL_COMMUNITY)
Admission: RE | Admit: 2022-06-21 | Discharge: 2022-06-21 | Disposition: A | Discharge status: Home / Self Care | Payer: Medicare Other | Source: Ambulatory Visit | Attending: Urology | Admitting: Urology

## 2022-06-21 DIAGNOSIS — C679 Malignant neoplasm of bladder, unspecified: Secondary | ICD-10-CM | POA: Diagnosis present

## 2022-06-22 ENCOUNTER — Other Ambulatory Visit: Payer: Self-pay | Admitting: Gastroenterology

## 2022-06-22 DIAGNOSIS — Z86010 Personal history of colonic polyps: Secondary | ICD-10-CM

## 2022-06-23 ENCOUNTER — Ambulatory Visit: Payer: Medicare Other | Admitting: Urology

## 2022-07-05 ENCOUNTER — Ambulatory Visit (INDEPENDENT_AMBULATORY_CARE_PROVIDER_SITE_OTHER): Payer: Medicare Other | Admitting: Urology

## 2022-07-05 DIAGNOSIS — N2889 Other specified disorders of kidney and ureter: Secondary | ICD-10-CM

## 2022-07-05 DIAGNOSIS — Z8551 Personal history of malignant neoplasm of bladder: Secondary | ICD-10-CM | POA: Diagnosis not present

## 2022-07-05 DIAGNOSIS — C679 Malignant neoplasm of bladder, unspecified: Secondary | ICD-10-CM

## 2022-07-09 ENCOUNTER — Encounter: Payer: Self-pay | Admitting: Urology

## 2022-07-09 NOTE — Progress Notes (Signed)
07/05/2022 3:30 PM   Andrew [name] 04/18/1951 [id number]  Referring provider: Jolinda Croak, [place],   [id number]  Followup renal mass  Patient location: home Physician location: office I connected with  Andrew [name] on 07/09/22 by a video enabled telemedicine application and verified that I am speaking with the correct person using two identifiers.   I discussed the limitations of evaluation and management by telemedicine. The patient expressed understanding and agreed to proceed.     HPI: Andrew [name] is a 72yo here for followup for bladder cancer and new left renal mass. Urine cytology was inconclusive. Renal US 06/21/2022 shows new mild left hydronephrosis without mass. He denies any flank pain. No hematuria   PMH: Past Medical History:  Diagnosis Date   Acute viral pericarditis 09/03/2018   Inferior STE - but Negative Troponin.  CP &SOB.  Felt to be Viral.   Anemia    Arthritis    Bladder cancer ([place]) 2014   CAD (coronary artery disease)    Cataract    bilateral   Depression    Diabetes mellitus without complication ([place])    Essential hypertension 01/20/2018   in the past no longer on medication   GERD (gastroesophageal reflux disease)    Hx of adenomatous colonic polyps 09/2017   colonoscopy   Hyperlipidemia with target LDL less than 70 12/19/2015   Intraoperative floppy iris syndrome (IFIS) 07/2019   Multivessel CAD - CTO dLAD, mCx. DES PCI RI, PTCA of RPDA 01/21/2018   12/2017 - Cath for ? STEMI -> distal/apical LAD & m-dCx CTO (unable to cross Cx).  Mod rPDA. Severe RI - DES PCI.  08/2018 - Cath for ? Inf STEMI - RI stent patent & CTO dLAD/mCx. Progression of rPDA 95% -> PTCA only.  Thought to be Pericarditis & not MI (troponin negative).    Neuromuscular disorder ([place])    Sleep apnea    BiPAP not currently being used   Status post tendon repair 1989   STEMI (ST elevation myocardial infarction) ([place]) 01/20/2018    Cardiac cath January 21, 2018: Severe multivessel disease with unclear lesion, but opted for PCI/DES x1 to the RI.  Appeared to have CTO of the distal LAD and circumflex as well as moderate mid LAD and diagonal disease as well as PDA.Marland Kitchen  Unable to cross circumflex lesion.   Stroke ([place]) 11/2015   At times pt has dizziness with loss of vision   Tremors of nervous system 2011    Surgical History: Past Surgical History:  Procedure Laterality Date   APPENDECTOMY     BLADDER SURGERY     COLONOSCOPY     CORONARY STENT INTERVENTION N/A 01/21/2018   Procedure: CORONARY STENT INTERVENTION;  Surgeon: Leonie Man, MD;  Location: [facility];  Service: Cardiovascular;  Laterality: N/A;  95% Ramus Intermedius -PCI with synergy DES 2.25 mm x 12 mm postdilated 2.4 mm.   CORONARY/GRAFT ACUTE MI REVASCULARIZATION N/A 01/21/2018   Procedure: Coronary/Graft Acute MI Revascularization;  Surgeon: Leonie Man, MD;  Location: [facility];  Service: Cardiovascular;  Laterality: N/A;  attempted revas to distal CFX;    CORONARY/GRAFT ACUTE MI REVASCULARIZATION N/A 09/04/2018   Procedure: Coronary/Graft Acute MI Revascularization;  Surgeon: Sherren Mocha, MD;  Location: [facility]:: PTCA/POBA of rPDA (2.0 mm balloon)   CYSTOSCOPY N/A 01/21/2020   Procedure: CYSTOSCOPY;  Surgeon: Cleon Gustin, MD;  Location: [facility];  Service: Urology;  Laterality: N/A;   CYSTOSCOPY N/A 02/09/2021   Procedure: CYSTOSCOPY;  Surgeon: Cleon Gustin, MD;  Location: [facility];  Service: Urology;  Laterality: N/A;   CYSTOSCOPY W/ RETROGRADES Bilateral 08/22/2015   Procedure: CYSTOSCOPY WITH RETROGRADE PYELOGRAM;  Surgeon: Irine Seal, MD;  Location: [facility];  Service: Urology;  Laterality: Bilateral;   CYSTOSCOPY W/ RETROGRADES Bilateral 01/16/2016   Procedure: CYSTOSCOPY WITH RETROGRADE PYELOGRAM;  Surgeon: Irine Seal, MD;  Location: [facility];  Service: Urology;  Laterality: Bilateral;   CYSTOSCOPY W/  RETROGRADES Bilateral 01/07/2017   Procedure: CYSTOSCOPY WITH RETROGRADE PYELOGRAM;  Surgeon: Irine Seal, MD;  Location: [facility];  Service: Urology;  Laterality: Bilateral;   CYSTOSCOPY WITH BIOPSY N/A 08/22/2015   Procedure: CYSTOSCOPY WITH BLADDER BIOPSY;  Surgeon: Irine Seal, MD;  [facility]: [facility];  Service: Urology;  Laterality: N/A;   CYSTOSCOPY WITH BIOPSY N/A 01/16/2016   Procedure: CYSTOSCOPY WITH BLADDER BIOPSY;  Surgeon: Irine Seal, MD;  [facility]: [facility];  Service: Urology;  Laterality: N/A;   CYSTOSCOPY WITH FULGERATION N/A 01/16/2016   Procedure: CYSTOSCOPY WITH FULGERATION;  Surgeon: Irine Seal, MD;  [facility];  Service: Urology;  Laterality: N/A;   KNEE ARTHROSCOPY Right 1989   LEFT HEART CATH AND CORONARY ANGIOGRAPHY N/A 01/21/2018   Procedure: LEFT HEART CATH AND CORONARY ANGIOGRAPHY;  Surgeon: Leonie Man, MD;  Location: [facility];  Service: Cardiovascular;; 95% RI-DES PCI.  80% o-pCX (PTCA) -> dCX 80%-100% CTO (unsuccessful PTCA unable to cross distal lesion with balloon.)  100% CTO dLAD. RPDA 80% and 70% as well as P AV 180%, PA V2 60%.  (too small for PCI)   LEFT HEART CATH AND CORONARY ANGIOGRAPHY N/A 09/04/2018   Procedure: LEFT HEART CATH AND CORONARY ANGIOGRAPHY;  Surgeon: Sherren Mocha, MD;  Location: [facility]: (presumed Inferior STEMI) = progression of small rPDA -95% (PTCA only). Patent RI stent. CTO of apical LAD & mCx.   ROTATOR CUFF REPAIR Bilateral 2000   rt. leg fracture surgery     TRANSTHORACIC ECHOCARDIOGRAM  01/21/2018   Mild LVH.  EF 50 and 55%.  Apical inferior hypokinesis.  Mid-apical anterolateral hypokinesis.  Normal diastolic function for age    TRANSTHORACIC ECHOCARDIOGRAM  09/04/2018   -? Inf STEMI vs. Pericarditis:  Mildly reduced EF of 45 to 50%.  Impaired relaxation-GR 1 DD.  Severe hypokinesis of the anterolateral and inferolateral wall.  Small-moderate anterior pericardial effusion.  Moderate aortic sclerosis but no  stenosis.   TRANSURETHRAL RESECTION OF BLADDER TUMOR N/A 01/07/2017   Procedure: TRANSURETHRAL RESECTION OF BLADDER TUMOR (TURBT);  Surgeon: Irine Seal, MD;  Location: [facility];  Service: Urology;  Laterality: N/A;   TRANSURETHRAL RESECTION OF BLADDER TUMOR N/A 01/21/2020   Procedure: TRANSURETHRAL RESECTION OF BLADDER TUMOR (TURBT);  Surgeon: Cleon Gustin, MD;  Location: [facility];  Service: Urology;  Laterality: N/A;   TRANSURETHRAL RESECTION OF BLADDER TUMOR N/A 02/09/2021   Procedure: TRANSURETHRAL RESECTION OF BLADDER TUMOR (TURBT);  Surgeon: Cleon Gustin, MD;  Location: [facility];  Service: Urology;  Laterality: N/A;   WISDOM TOOTH EXTRACTION      Home Medications:  Allergies as of 07/05/2022       Reactions   Tramadol Shortness Of Breath   And dizziness   Trulicity [dulaglutide] Swelling   Ankles and feet swell        Medication List        Accurate as of July 05, 2022 11:59 PM. If  you have any questions, ask your nurse or doctor.          Basaglar KwikPen 100 UNIT/ML Inject 12 Units into the skin daily.   clopidogrel 75 MG tablet Commonly known as: PLAVIX Take 75 mg by mouth once.   empagliflozin 25 MG Tabs tablet Commonly known as: JARDIANCE Take 25 mg by mouth daily.   fenofibrate 48 MG tablet Commonly known as: Tricor Take 1 tablet (48 mg total) by mouth daily.   Fiber Choice Fruity Bites 1.5 g Chew Generic drug: Inulin Take daily as directed   isosorbide mononitrate 30 MG 24 hr tablet Commonly known as: IMDUR Take 1 tablet (30 mg total) by mouth daily. What changed:  how much to take when to take this   ketoconazole 2 % cream Commonly known as: NIZORAL Apply 1 application topically daily as needed for irritation.   LUBRICATING EYE DROPS OP Place 1 drop into both eyes daily as needed (dry eyes).   metFORMIN 1000 MG tablet Commonly known as: GLUCOPHAGE Take 1 tablet by mouth 2 times daily with a meal. What changed:  how much to  take how to take this when to take this additional instructions   nitroGLYCERIN 0.4 MG SL tablet Commonly known as: NITROSTAT PLACE 1 TABLET UNDER THE TONGUE AT ONSET OF CHEST PAIN EVERY 5 MINTUES UP TO 3 TIMES AS NEEDED   OneTouch Verio test strip Generic drug: glucose blood daily.   oxyCODONE-acetaminophen 10-325 MG tablet Commonly known as: PERCOCET Take 2 tablets by mouth daily as needed for pain.   pantoprazole 40 MG tablet Commonly known as: PROTONIX Take 1 tablet (40 mg total) by mouth 2 (two) times daily. Please schedule an office visit for further refills. Thank you   primidone 50 MG tablet Commonly known as: MYSOLINE Take 1 tablet (50 mg total) by mouth 4 (four) times daily. What changed: how much to take   propranolol 20 MG tablet Commonly known as: INDERAL Take 20 mg by mouth 2 (two) times daily.   rosuvastatin 40 MG tablet Commonly known as: CRESTOR Take 1 tablet (40 mg total) by mouth daily.   sitaGLIPtin 100 MG tablet Commonly known as: JANUVIA Take 100 mg by mouth daily.   tamsulosin 0.4 MG Caps capsule Commonly known as: FLOMAX Take 1 capsule (0.4 mg total) by mouth daily.        Allergies:  Allergies  Allergen Reactions   Tramadol Shortness Of Breath    And dizziness   Trulicity [Dulaglutide] Swelling    Ankles and feet swell    Family History: Family History  Problem Relation Age of Onset   Diabetes Mother 70       type 1   Stroke Mother    Dementia Father    Diabetes Brother    Heart attack Brother 66   Testicular cancer Brother    Diabetes Brother    Cancer Brother        testicular   Colon cancer Neg Hx    Colon polyps Neg Hx    Esophageal cancer Neg Hx    Rectal cancer Neg Hx    Stomach cancer Neg Hx    Pancreatic cancer Neg Hx     Social History:  reports that he has been smoking cigarettes. He has a 50.00 pack-year smoking history. He has quit using smokeless tobacco. He reports that he does not drink alcohol and  does not use drugs.  ROS: All other review of systems were reviewed and are  negative except what is noted above in HPI   Laboratory Data: Lab Results  Component Value Date   WBC 8.1 02/05/2021   HGB 16.3 02/05/2021   HCT 48.8 02/05/2021   MCV 96.8 02/05/2021   PLT 221 02/05/2021    Lab Results  Component Value Date   CREATININE 0.87 02/05/2021    No results found for: "PSA"  No results found for: "TESTOSTERONE"  Lab Results  Component Value Date   HGBA1C 7.1 (H) 02/05/2021    Urinalysis    Component Value Date/Time   COLORURINE YELLOW 09/03/2018 0000   APPEARANCEUR Cloudy (A) 05/12/2022 1407   LABSPEC 1.028 09/03/2018 0000   PHURINE 5.0 09/03/2018 0000   GLUCOSEU 3+ (A) 05/12/2022 1407   HGBUR SMALL (A) 09/03/2018 0000   BILIRUBINUR Negative 05/12/2022 1407   KETONESUR NEGATIVE 09/03/2018 0000   PROTEINUR 1+ (A) 05/12/2022 1407   PROTEINUR NEGATIVE 09/03/2018 0000   UROBILINOGEN 0.2 10/15/2019 1606   NITRITE Negative 05/12/2022 1407   NITRITE NEGATIVE 09/03/2018 0000   LEUKOCYTESUR Negative 05/12/2022 1407   LEUKOCYTESUR NEGATIVE 09/03/2018 0000    Lab Results  Component Value Date   LABMICR See below: 05/12/2022   WBCUA 0-5 05/12/2022   RBCUA None seen 01/15/2016   LABEPIT 0-10 05/12/2022   MUCUS Present 11/16/2021   BACTERIA None seen 05/12/2022    Pertinent Imaging: Renal US 06/21/2022: Images reviewed and discussed with the patient  No results found for this or any previous visit.  No results found for this or any previous visit.  No results found for this or any previous visit.  No results found for this or any previous visit.  Results for orders placed during the hospital encounter of 06/21/22  Ultrasound renal complete  Narrative CLINICAL DATA:  left hydronephrosis  EXAM: RENAL / URINARY TRACT ULTRASOUND COMPLETE  COMPARISON:  September 04, 2018  FINDINGS: Right Kidney:  Renal measurements: 13.1 x 4.5 x 7.6 cm = volume: 232  mL. Echogenicity within normal limits. No mass or hydronephrosis visualized.  Left Kidney:  Assessment is limited secondary to overlapping bowel gas and limited acoustic windows. Renal measurements: 7.3 x 6.3 x 5.5 cm = volume: 140 mL. Echogenicity within normal limits. No mass visualized. There is mild LEFT-sided pelviectasis without frank hydronephrosis.  Bladder:  Appears normal for degree of bladder distention.  Other:  None.  IMPRESSION: There is mild LEFT-sided pelviectasis without frank hydronephrosis.  If concern for obstructive physiology, recommend dedicated CT scan abdomen pelvis with and without contrast.   Electronically Signed By: Valentino Saxon M.D. On: 06/21/2022 16:11  No valid procedures specified. No results found for this or any previous visit.  Results for orders placed during the hospital encounter of 01/14/17  CT RENAL STONE STUDY  Narrative CLINICAL DATA:  Sharp back pain and hematuria. Recent bladder tumor removed 1 week ago.  EXAM: CT ABDOMEN AND PELVIS WITHOUT CONTRAST  TECHNIQUE: Multidetector CT imaging of the abdomen and pelvis was performed following the standard protocol without IV contrast.  COMPARISON:  None.  FINDINGS: Lower chest: No acute abnormality. Right coronary artery atherosclerotic vascular calcifications.  Hepatobiliary: Hepatic steatosis. Scattered punctate granulomas throughout the liver, consistent with prior granulomatous disease. The gallbladder is decompressed. No biliary dilatation.  Pancreas: Unremarkable. No pancreatic ductal dilatation or surrounding inflammatory changes.  Spleen: Normal size without focal abnormality. Multiple punctate calcifications within the spleen, consistent with prior granulomatous disease.  Adrenals/Urinary Tract: The adrenal glands are unremarkable. No renal or ureteral calculi. Minimal left  greater than right perinephric fat stranding, nonspecific. No  hydronephrosis. The bladder is unremarkable.  Stomach/Bowel: Stomach is within normal limits. Appendix is surgically absent. No evidence of bowel wall thickening, distention, or inflammatory changes. Scattered left-sided colonic diverticulosis.  Vascular/Lymphatic: Aortic atherosclerosis. No enlarged abdominal or pelvic lymph nodes.  Reproductive: Prostate is unremarkable.  Other: No abdominal wall hernia or abnormality. No abdominopelvic ascites.  Musculoskeletal: No acute osseous abnormality. Degenerative changes of the thoracolumbar spine with severe lower lumbar facet arthropathy. 6 mm anterolisthesis of L4 on L5.  IMPRESSION: 1. No evidence of acute intra-abdominal process. No explanation for the patient's symptoms. 2. No renal or ureteral calculi.  No hydronephrosis. 3. Hepatic steatosis. 4.  Aortic atherosclerosis (ICD10-I70.0).   Electronically Signed By: Titus Dubin M.D. On: 01/14/2017 08:52   Assessment & Plan:    Left renal mass -I discussed the management of renal masses in patients with a history of bladder cancer. We discussed observation versus diagnostic ureteroscopy and after discussing the options the patient elects for diagnostic ureteroscopy. Risks/benefits/alternitives discussed,  No follow-ups on file.  Nicolette Bang, MD  [facility] [place]

## 2022-07-09 NOTE — Patient Instructions (Signed)

## 2022-07-12 ENCOUNTER — Telehealth: Payer: Self-pay | Admitting: *Deleted

## 2022-07-12 ENCOUNTER — Telehealth: Payer: Self-pay

## 2022-07-12 NOTE — Telephone Encounter (Signed)
It appears patient is now followed by [facility]. He has not been seen by our team since 2021.   Emmaline Life, NP-C  07/12/2022, 3:29 PM [address], [address] Office [phone] Fax [phone]

## 2022-07-12 NOTE — Telephone Encounter (Signed)
   Pre-operative Risk Assessment    Patient Name: Andrew [name]  DOB: [birth year] MRN: [id number]      Request for Surgical Clearance    Procedure:   CYSTOSCOPY LEFT RETROGRADE PYELOGRAM LEFT DIAGNOSTIC URETEROSCOPY WITH POSSIBLE LEFT URETERAL Bx AND FULGURATION PROCEDURE  Date of Surgery:  Clearance 09/06/22                                 Surgeon:  DR. PATRICK McKENZIE Surgeon's Group or Practice Name:  [facility] Phone number:  [phone] Fax number:  [phone] ATTN: Estill Bamberg, RN   Type of Clearance Requested:   - Medical  - Pharmacy:  Hold Clopidogrel (Plavix) x 5 DAYS PRIOR TO PROCEDURE   Type of Anesthesia:  General    Additional requests/questions:    Jiles Prows   07/12/2022, 3:02 PM

## 2022-07-12 NOTE — Telephone Encounter (Signed)
I spoke with Mr Gochnauer. We have discussed possible surgery dates and 09/06/2022  was agreed upon by all parties. Patient given information about surgery date, what to expect pre-operatively and post operatively. Patient requested a Monday appointment for surgery.   We discussed that a pre-op nurse will be calling to set up the pre-op visit that will take place prior to surgery. Informed patient that our office will communicate any additional care to be provided after surgery.    Patients questions or concerns were discussed during our call. Advised to call our office should there be any additional information, questions or concerns that arise. Patient verbalized understanding.    Plavix hold pending cardiology.

## 2022-08-18 ENCOUNTER — Telehealth: Payer: Self-pay

## 2022-08-18 NOTE — Telephone Encounter (Signed)
Surgical clearance received from cardiologist. Patient called and discussed holding medication Plavix dose after April 9th. Patient voiced understanding of medication hold but okay to continue 81mg  ASA for procedure.

## 2022-08-31 NOTE — Patient Instructions (Signed)
Andrew [name]  08/31/2022     @PREFPERIOPPHARMACY @   Your procedure is scheduled on  09/06/2022.   [facility] at  0845  A.M.   Call this number if you have problems the morning of surgery:  [phone]  If you experience any cold or flu symptoms such as cough, fever, chills, shortness of breath, etc. between now and your scheduled surgery, please notify us at the above number.   Remember:  Do not eat or drink after midnight.    Your last dose of plavix should be on 08/31/2022.         Your last dose of jardiance should be on 09/02/2022.        Take 1/2 dose of your evening insulin.      DO NOT take any medications for diabetes the morning of your procedure.     Take these medicines the morning of surgery with A SIP OF WATER        flomax, levothyroxine, metoprolol, oxycodone(if needed), pantoprazole, reneva(if needed).     Do not wear jewelry, make-up or nail polish.  Do not wear lotions, powders, or perfumes, or deodorant.  Do not shave 48 hours prior to surgery.  Men may shave face and neck.  Do not bring valuables to the hospital.  [facility] belongings or valuables.  Contacts, dentures or bridgework may not be worn into surgery.  Leave your suitcase in the car.  After surgery it may be brought to your room.  For patients admitted to the hospital, discharge time will be determined by your treatment team.  Patients discharged the day of surgery will not be allowed to drive home and must have someone with them for 24 hours.    Special instructions:   DO NOT smoke tobacco or vape for 24 hours before your procedure.  Please read over the following fact sheets that you were given. Coughing and Deep Breathing, Surgical Site Infection Prevention, Anesthesia Post-op Instructions, and Care and Recovery After Surgery      Ureteroscopy  Ureteroscopy is a procedure to check for and treat problems inside part of the urinary  tract. In this procedure, a long rigid or flexible tube with a lens and light at the end (ureteroscope) is used to look at the inside of the kidneys and the ureters. The ureters are the tubes that carry urine from the kidneys to the bladder. The ureteroscope is inserted into one or both of the ureters. You may need this procedure if you have frequent urinary tract infections (UTIs), blood in your urine, or a stone in one or both of your ureters. A ureteroscopy can be done: To find the cause of urine blockage in a ureter and to evaluate other abnormalities inside the ureters or kidneys. To remove stones. To remove or treat growths of tissue (polyps), abnormal tissue, and some types of tumors. To remove a tissue sample and check it for disease under a microscope (biopsy). Tell a health care provider about: Any allergies you have. All medicines you are taking, including vitamins, herbs, eye drops, creams, and over-the-counter medicines. Any problems you or family members have had with anesthetic medicines. Any bleeding problems you have. Any surgeries you have had. Any medical conditions you have. Whether you are pregnant or may be pregnant. What are the risks? Your health care provider will talk with you about risks. These may include: Abdominal pain or a burning  feeling or pain while urinating. Abnormal bleeding. A UTI. Allergic reactions to medicines. Scarring that narrows the ureter (stricture) or swelling. Creating a hole (perforation) in the ureter. Damage to other structures or organs, such as the part of your body that drains urine from your bladder (urethra), your bladder, or your uterus. What happens before the procedure? When to stop eating and drinking  8 hours before your procedure Stop eating most foods. Do not eat meat, fried foods, or fatty foods. Eat only light foods, such as toast or crackers. All liquids are okay except energy drinks and alcohol. 6 hours before your  procedure Stop eating. Drink only clear liquids, such as water, clear fruit juice, black coffee, plain tea, and sports drinks. Do not drink energy drinks or alcohol. 2 hours before your procedure Stop drinking all liquids. You may be allowed to take medicines with small sips of water. Medicines Ask your health care provider about: Changing or stopping your regular medicines. These include any diabetes medicines or blood thinners you take. Taking medicines such as aspirin and ibuprofen. These medicines can thin your blood. Do not take these medicines unless your health care provider tells you to. Taking over-the-counter medicines, vitamins, herbs, and supplements. General instructions Do not use any products that contain nicotine or tobacco for at least 4 weeks before the procedure. These products include cigarettes, chewing tobacco, and vaping devices, such as e-cigarettes. If you need help quitting, ask your health care provider. If you will be going home right after the procedure, plan to have a responsible adult: Take you home from the hospital or clinic. You will not be allowed to drive. Care for you for the time you are told. Ask your health care provider what steps will be taken to help prevent infection. These may include: Washing skin with a soap that kills germs. Receiving antibiotic medicine. Tests You may have an exam or testing. You may have a urine sample taken to check for infection. What happens during the procedure? An IV will be inserted into one of your veins. You may be given: A sedative. This helps you relax. Anesthesia. This will: Numb certain areas of your body. Make you fall asleep for surgery. Your urethra will be cleaned with a germ-killing solution. The ureteroscope will be passed through your urethra into your bladder. A salt-water solution will be sent through the ureteroscope to fill your bladder. This will help the health care provider see the openings of  your ureters more clearly. The ureteroscope will be passed into your ureter. If a growth is found, a biopsy may be done. If a stone is found, it may be removed through the ureteroscope, or the stone may be broken up using a laser, shock waves, or electrical energy. In some cases, if the ureter is too small, a tube may be inserted that keeps the ureter open (ureteral stent). The stent may be left in place for 1 or 2 weeks, and then the ureteroscopy procedure will be done again. The scope will be removed, and your bladder will be emptied. The procedure may vary among health care providers and hospitals. What happens after the procedure? Your blood pressure, heart rate, breathing rate, and blood oxygen level will be monitored until you leave the hospital or clinic. It is up to you to get the results of your procedure. Ask your health care provider, or the department that is doing the procedure, when your results will be ready. Summary Ureteroscopy is a procedure used to  look at the inside of the kidneys and the ureters. You may need this procedure if you have frequent urinary tract infections (UTIs), blood in your urine, or a stone in one or both of your ureters. Follow instructions from your health care provider about eating and drinking. In some cases, if the ureter is too small, a tube may be inserted that keeps the ureter open (ureteral stent). The stent may be left in place for 1 or 2 weeks to keep the ureter open, and then the ureteroscopy procedure will be done again. This information is not intended to replace advice given to you by your health care provider. Make sure you discuss any questions you have with your health care provider. Document Revised: 09/04/2021 Document Reviewed: 09/04/2021 Elsevier Patient Education  2023 [employer]  General Anesthesia, Adult, Care After The following information offers guidance on how to care for yourself after your procedure. Your health care  provider may also give you more specific instructions. If you have problems or questions, contact your health care provider. What can I expect after the procedure? After the procedure, it is common for people to: Have pain or discomfort at the IV site. Have nausea or vomiting. Have a sore throat or hoarseness. Have trouble concentrating. Feel cold or chills. Feel weak, sleepy, or tired (fatigue). Have soreness and body aches. These can affect parts of the body that were not involved in surgery. Follow these instructions at home: For the time period you were told by your health care provider:  Rest. Do not participate in activities where you could fall or become injured. Do not drive or use machinery. Do not drink alcohol. Do not take sleeping pills or medicines that cause drowsiness. Do not make important decisions or sign legal documents. Do not take care of children on your own. General instructions Drink enough fluid to keep your urine pale yellow. If you have sleep apnea, surgery and certain medicines can increase your risk for breathing problems. Follow instructions from your health care provider about wearing your sleep device: Anytime you are sleeping, including during daytime naps. While taking prescription pain medicines, sleeping medicines, or medicines that make you drowsy. Return to your normal activities as told by your health care provider. Ask your health care provider what activities are safe for you. Take over-the-counter and prescription medicines only as told by your health care provider. Do not use any products that contain nicotine or tobacco. These products include cigarettes, chewing tobacco, and vaping devices, such as e-cigarettes. These can delay incision healing after surgery. If you need help quitting, ask your health care provider. Contact a health care provider if: You have nausea or vomiting that does not get better with medicine. You vomit every time you  eat or drink. You have pain that does not get better with medicine. You cannot urinate or have bloody urine. You develop a skin rash. You have a fever. Get help right away if: You have trouble breathing. You have chest pain. You vomit blood. These symptoms may be an emergency. Get help right away. Call 911. Do not wait to see if the symptoms will go away. Do not drive yourself to the hospital. Summary After the procedure, it is common to have a sore throat, hoarseness, nausea, vomiting, or to feel weak, sleepy, or fatigue. For the time period you were told by your health care provider, do not drive or use machinery. Get help right away if you have difficulty breathing, have chest pain,  or vomit blood. These symptoms may be an emergency. This information is not intended to replace advice given to you by your health care provider. Make sure you discuss any questions you have with your health care provider. Document Revised: 08/07/2021 Document Reviewed: 08/07/2021 Elsevier Patient Education  2023 [employer].

## 2022-09-01 ENCOUNTER — Encounter (HOSPITAL_COMMUNITY): Payer: Self-pay

## 2022-09-01 ENCOUNTER — Encounter (HOSPITAL_COMMUNITY)
Admission: RE | Admit: 2022-09-01 | Discharge: 2022-09-01 | Disposition: A | Discharge status: Home / Self Care | Payer: Medicare Other | Source: Ambulatory Visit | Attending: Urology | Admitting: Urology

## 2022-09-01 VITALS — BP 121/81 | HR 92 | Temp 97.9°F | Resp 18 | Ht 76.0 in | Wt 195.0 lb

## 2022-09-01 DIAGNOSIS — Z01818 Encounter for other preprocedural examination: Secondary | ICD-10-CM | POA: Diagnosis present

## 2022-09-01 DIAGNOSIS — I10 Essential (primary) hypertension: Secondary | ICD-10-CM | POA: Insufficient documentation

## 2022-09-01 DIAGNOSIS — E1159 Type 2 diabetes mellitus with other circulatory complications: Secondary | ICD-10-CM | POA: Insufficient documentation

## 2022-09-01 DIAGNOSIS — Z862 Personal history of diseases of the blood and blood-forming organs and certain disorders involving the immune mechanism: Secondary | ICD-10-CM | POA: Insufficient documentation

## 2022-09-01 DIAGNOSIS — F17200 Nicotine dependence, unspecified, uncomplicated: Secondary | ICD-10-CM | POA: Insufficient documentation

## 2022-09-01 LAB — BASIC METABOLIC PANEL
Anion gap: 7 (ref 5–15)
BUN: 13 mg/dL (ref 8–23)
CO2: 25 mmol/L (ref 22–32)
Calcium: 9 mg/dL (ref 8.9–10.3)
Chloride: 102 mmol/L (ref 98–111)
Creatinine, Ser: 0.98 mg/dL (ref 0.61–1.24)
GFR, Estimated: >60 mL/min (ref >60)
Glucose, Bld: 180 mg/dL — ABNORMAL HIGH (ref 70–99)
Potassium: 4.5 mmol/L (ref 3.5–5.1)
Sodium: 134 mmol/L — ABNORMAL LOW (ref 135–145)

## 2022-09-01 LAB — CBC WITH DIFFERENTIAL/PLATELET
Abs Immature Granulocytes: 0.04 10*3/uL (ref 0.00–0.07)
Basophils Absolute: 0.1 10*3/uL (ref 0.0–0.1)
Basophils Relative: 1 %
Eosinophils Absolute: 0.3 10*3/uL (ref 0.0–0.5)
Eosinophils Relative: 4 %
HCT: 46.5 % (ref 39.0–52.0)
Hemoglobin: 15.5 g/dL (ref 13.0–17.0)
Immature Granulocytes: 1 %
Lymphocytes Relative: 18 %
Lymphs Abs: 1.6 10*3/uL (ref 0.7–4.0)
MCH: 32 pg (ref 26.0–34.0)
MCHC: 33.3 g/dL (ref 30.0–36.0)
MCV: 95.9 fL (ref 80.0–100.0)
Monocytes Absolute: 0.9 10*3/uL (ref 0.1–1.0)
Monocytes Relative: 10 %
Neutro Abs: 6 10*3/uL (ref 1.7–7.7)
Neutrophils Relative %: 66 %
Platelets: 212 10*3/uL (ref 150–400)
RBC: 4.85 MIL/uL (ref 4.22–5.81)
RDW: 12.7 % (ref 11.5–15.5)
WBC: 8.9 10*3/uL (ref 4.0–10.5)
nRBC: 0 % (ref 0.0–0.2)

## 2022-09-01 LAB — HEMOGLOBIN A1C
Hgb A1c MFr Bld: 8.1 % — ABNORMAL HIGH (ref 4.8–5.6)
Mean Plasma Glucose: 185.77 mg/dL

## 2022-09-02 NOTE — Pre-Procedure Instructions (Signed)
Patient in for pre-op. He states he had MI in November of 2023. Dr Johnnette Litter in to see patient and is okay to proceed with procedure.

## 2022-09-06 ENCOUNTER — Ambulatory Visit (HOSPITAL_COMMUNITY): Payer: Medicare Other | Admitting: Anesthesiology

## 2022-09-06 ENCOUNTER — Ambulatory Visit (HOSPITAL_COMMUNITY)
Admission: RE | Admit: 2022-09-06 | Discharge: 2022-09-06 | Disposition: A | Discharge status: Home / Self Care | Payer: Medicare Other | Attending: Urology | Admitting: Urology

## 2022-09-06 ENCOUNTER — Encounter (HOSPITAL_COMMUNITY): Payer: Self-pay | Admitting: Urology

## 2022-09-06 ENCOUNTER — Ambulatory Visit (HOSPITAL_BASED_OUTPATIENT_CLINIC_OR_DEPARTMENT_OTHER): Payer: Medicare Other | Admitting: Anesthesiology

## 2022-09-06 ENCOUNTER — Encounter (HOSPITAL_COMMUNITY)
Admission: RE | Disposition: A | Discharge status: Home / Self Care | Payer: Self-pay | Source: Home / Self Care | Attending: Urology

## 2022-09-06 ENCOUNTER — Ambulatory Visit (HOSPITAL_COMMUNITY): Payer: Medicare Other

## 2022-09-06 DIAGNOSIS — I2510 Atherosclerotic heart disease of native coronary artery without angina pectoris: Secondary | ICD-10-CM | POA: Diagnosis not present

## 2022-09-06 DIAGNOSIS — Z833 Family history of diabetes mellitus: Secondary | ICD-10-CM | POA: Insufficient documentation

## 2022-09-06 DIAGNOSIS — J449 Chronic obstructive pulmonary disease, unspecified: Secondary | ICD-10-CM | POA: Insufficient documentation

## 2022-09-06 DIAGNOSIS — D630 Anemia in neoplastic disease: Secondary | ICD-10-CM | POA: Diagnosis not present

## 2022-09-06 DIAGNOSIS — D638 Anemia in other chronic diseases classified elsewhere: Secondary | ICD-10-CM | POA: Diagnosis not present

## 2022-09-06 DIAGNOSIS — E039 Hypothyroidism, unspecified: Secondary | ICD-10-CM | POA: Diagnosis not present

## 2022-09-06 DIAGNOSIS — Z794 Long term (current) use of insulin: Secondary | ICD-10-CM | POA: Insufficient documentation

## 2022-09-06 DIAGNOSIS — D49512 Neoplasm of unspecified behavior of left kidney: Secondary | ICD-10-CM | POA: Diagnosis not present

## 2022-09-06 DIAGNOSIS — Z8551 Personal history of malignant neoplasm of bladder: Secondary | ICD-10-CM | POA: Insufficient documentation

## 2022-09-06 DIAGNOSIS — Z955 Presence of coronary angioplasty implant and graft: Secondary | ICD-10-CM | POA: Diagnosis not present

## 2022-09-06 DIAGNOSIS — I252 Old myocardial infarction: Secondary | ICD-10-CM | POA: Insufficient documentation

## 2022-09-06 DIAGNOSIS — G4730 Sleep apnea, unspecified: Secondary | ICD-10-CM | POA: Insufficient documentation

## 2022-09-06 DIAGNOSIS — I10 Essential (primary) hypertension: Secondary | ICD-10-CM | POA: Insufficient documentation

## 2022-09-06 DIAGNOSIS — Z8249 Family history of ischemic heart disease and other diseases of the circulatory system: Secondary | ICD-10-CM | POA: Diagnosis not present

## 2022-09-06 DIAGNOSIS — C652 Malignant neoplasm of left renal pelvis: Secondary | ICD-10-CM

## 2022-09-06 DIAGNOSIS — E119 Type 2 diabetes mellitus without complications: Secondary | ICD-10-CM | POA: Insufficient documentation

## 2022-09-06 DIAGNOSIS — F17200 Nicotine dependence, unspecified, uncomplicated: Secondary | ICD-10-CM | POA: Diagnosis not present

## 2022-09-06 DIAGNOSIS — K219 Gastro-esophageal reflux disease without esophagitis: Secondary | ICD-10-CM | POA: Diagnosis not present

## 2022-09-06 DIAGNOSIS — N2889 Other specified disorders of kidney and ureter: Secondary | ICD-10-CM

## 2022-09-06 DIAGNOSIS — Z8043 Family history of malignant neoplasm of testis: Secondary | ICD-10-CM | POA: Diagnosis not present

## 2022-09-06 HISTORY — PX: URETERAL BIOPSY: SHX6688

## 2022-09-06 HISTORY — PX: CYSTOSCOPY/RETROGRADE/URETEROSCOPY: SHX5316

## 2022-09-06 LAB — GLUCOSE, CAPILLARY
Glucose-Capillary: 185 mg/dL — ABNORMAL HIGH (ref 70–99)
Glucose-Capillary: 250 mg/dL — ABNORMAL HIGH (ref 70–99)

## 2022-09-06 SURGERY — CYSTOSCOPY/RETROGRADE/URETEROSCOPY
Anesthesia: General | Site: Ureter | Laterality: Left

## 2022-09-06 MED ORDER — FENTANYL CITRATE PF 50 MCG/ML IJ SOSY
25.0000 ug | PREFILLED_SYRINGE | INTRAMUSCULAR | Status: AC | PRN
  Administered 2022-09-06 (×2): 25 ug via INTRAVENOUS

## 2022-09-06 MED ORDER — CHLORHEXIDINE GLUCONATE 0.12 % MT SOLN
OROMUCOSAL | Status: AC
  Administered 2022-09-06: 15 mL via OROMUCOSAL
  Filled 2022-09-06: qty 15

## 2022-09-06 MED ORDER — FENTANYL CITRATE PF 50 MCG/ML IJ SOSY
PREFILLED_SYRINGE | INTRAMUSCULAR | Status: AC
  Filled 2022-09-06: qty 1

## 2022-09-06 MED ORDER — ONDANSETRON HCL 4 MG/2ML IJ SOLN
4.0000 mg | Freq: Once | INTRAMUSCULAR | Status: AC | PRN
  Administered 2022-09-06: 4 mg via INTRAVENOUS
  Filled 2022-09-06: qty 2

## 2022-09-06 MED ORDER — FENTANYL CITRATE (PF) 100 MCG/2ML IJ SOLN
INTRAMUSCULAR | Status: AC
  Filled 2022-09-06: qty 2

## 2022-09-06 MED ORDER — METOCLOPRAMIDE HCL 5 MG/ML IJ SOLN
10.0000 mg | Freq: Once | INTRAMUSCULAR | Status: AC
  Administered 2022-09-06: 10 mg via INTRAVENOUS
  Filled 2022-09-06: qty 2

## 2022-09-06 MED ORDER — DEXAMETHASONE SODIUM PHOSPHATE 4 MG/ML IJ SOLN
4.0000 mg | Freq: Once | INTRAMUSCULAR | Status: AC
  Administered 2022-09-06: 4 mg via INTRAVENOUS
  Filled 2022-09-06: qty 1

## 2022-09-06 MED ORDER — HYDROMORPHONE HCL 1 MG/ML IJ SOLN
0.2500 mg | INTRAMUSCULAR | Status: DC | PRN
  Administered 2022-09-06: 0.25 mg via INTRAVENOUS
  Filled 2022-09-06 (×2): qty 0.5

## 2022-09-06 MED ORDER — IPRATROPIUM-ALBUTEROL 0.5-2.5 (3) MG/3ML IN SOLN
RESPIRATORY_TRACT | Status: AC
  Filled 2022-09-06: qty 3

## 2022-09-06 MED ORDER — PHENYLEPHRINE 80 MCG/ML (10ML) SYRINGE FOR IV PUSH (FOR BLOOD PRESSURE SUPPORT)
PREFILLED_SYRINGE | INTRAVENOUS | Status: AC
  Filled 2022-09-06: qty 10

## 2022-09-06 MED ORDER — DEXAMETHASONE SODIUM PHOSPHATE 10 MG/ML IJ SOLN
INTRAMUSCULAR | Status: DC | PRN
  Administered 2022-09-06: 5 mg via INTRAVENOUS

## 2022-09-06 MED ORDER — ONDANSETRON HCL 4 MG/2ML IJ SOLN
INTRAMUSCULAR | Status: DC | PRN
  Administered 2022-09-06: 4 mg via INTRAVENOUS

## 2022-09-06 MED ORDER — DIPHENHYDRAMINE HCL 50 MG/ML IJ SOLN
INTRAMUSCULAR | Status: AC
  Filled 2022-09-06: qty 1

## 2022-09-06 MED ORDER — LIDOCAINE HCL (CARDIAC) PF 100 MG/5ML IV SOSY
PREFILLED_SYRINGE | INTRAVENOUS | Status: DC | PRN
  Administered 2022-09-06: 60 mg via INTRAVENOUS

## 2022-09-06 MED ORDER — CHLORHEXIDINE GLUCONATE 0.12 % MT SOLN: 15.0000 mL | Freq: Once | OROMUCOSAL | Status: AC

## 2022-09-06 MED ORDER — PROPOFOL 10 MG/ML IV BOLUS
INTRAVENOUS | Status: DC | PRN
  Administered 2022-09-06: 20 mg via INTRAVENOUS
  Administered 2022-09-06: 150 mg via INTRAVENOUS
  Administered 2022-09-06: 10 mg via INTRAVENOUS
  Administered 2022-09-06: 40 mg via INTRAVENOUS

## 2022-09-06 MED ORDER — LACTATED RINGERS IV SOLN: INTRAVENOUS | Status: DC

## 2022-09-06 MED ORDER — PROPOFOL 10 MG/ML IV BOLUS
INTRAVENOUS | Status: AC
  Filled 2022-09-06: qty 20

## 2022-09-06 MED ORDER — DIPHENHYDRAMINE HCL 50 MG/ML IJ SOLN
25.0000 mg | Freq: Once | INTRAMUSCULAR | Status: AC
  Administered 2022-09-06: 25 mg via INTRAVENOUS

## 2022-09-06 MED ORDER — DIATRIZOATE MEGLUMINE 30 % UR SOLN
URETHRAL | Status: DC | PRN
  Administered 2022-09-06: 7 mL via URETHRAL

## 2022-09-06 MED ORDER — CEFAZOLIN SODIUM-DEXTROSE 2-4 GM/100ML-% IV SOLN
2.0000 g | INTRAVENOUS | Status: AC
  Administered 2022-09-06: 2 g via INTRAVENOUS

## 2022-09-06 MED ORDER — EPHEDRINE 5 MG/ML INJ
INTRAVENOUS | Status: AC
  Filled 2022-09-06: qty 5

## 2022-09-06 MED ORDER — ORAL CARE MOUTH RINSE: 15.0000 mL | Freq: Once | OROMUCOSAL | Status: AC

## 2022-09-06 MED ORDER — FENTANYL CITRATE (PF) 250 MCG/5ML IJ SOLN
INTRAMUSCULAR | Status: DC | PRN
  Administered 2022-09-06 (×4): 25 ug via INTRAVENOUS

## 2022-09-06 MED ORDER — DIATRIZOATE MEGLUMINE 30 % UR SOLN
URETHRAL | Status: AC
  Filled 2022-09-06: qty 100

## 2022-09-06 MED ORDER — IPRATROPIUM-ALBUTEROL 0.5-2.5 (3) MG/3ML IN SOLN
3.0000 mL | Freq: Once | RESPIRATORY_TRACT | Status: AC
  Administered 2022-09-06: 3 mL via RESPIRATORY_TRACT

## 2022-09-06 MED ORDER — SODIUM CHLORIDE 0.9 % IR SOLN
Status: DC | PRN
  Administered 2022-09-06: 3000 mL

## 2022-09-06 MED ORDER — PHENYLEPHRINE 80 MCG/ML (10ML) SYRINGE FOR IV PUSH (FOR BLOOD PRESSURE SUPPORT)
PREFILLED_SYRINGE | INTRAVENOUS | Status: DC | PRN
  Administered 2022-09-06 (×2): 160 ug via INTRAVENOUS
  Administered 2022-09-06: 80 ug via INTRAVENOUS
  Administered 2022-09-06 (×3): 160 ug via INTRAVENOUS
  Administered 2022-09-06: 80 ug via INTRAVENOUS
  Administered 2022-09-06: 160 ug via INTRAVENOUS

## 2022-09-06 MED ORDER — CEFAZOLIN SODIUM-DEXTROSE 2-4 GM/100ML-% IV SOLN
INTRAVENOUS | Status: AC
  Filled 2022-09-06: qty 100

## 2022-09-06 MED ORDER — EPHEDRINE SULFATE (PRESSORS) 50 MG/ML IJ SOLN
INTRAMUSCULAR | Status: DC | PRN
  Administered 2022-09-06: 5 mg via INTRAVENOUS

## 2022-09-06 MED ORDER — METOCLOPRAMIDE HCL 5 MG/ML IJ SOLN
INTRAMUSCULAR | Status: AC
  Filled 2022-09-06: qty 2

## 2022-09-06 MED ORDER — OXYCODONE-ACETAMINOPHEN 5-325 MG PO TABS: 1.0000 | ORAL_TABLET | ORAL | 0 refills | Status: AC | PRN

## 2022-09-06 SURGICAL SUPPLY — 22 items
BAG DRAIN URO TABLE W/ADPT NS (BAG) ×1 IMPLANT
BAG DRN 8 ADPR NS SKTRN CSTL (BAG) ×1
BAG HAMPER (MISCELLANEOUS) ×1 IMPLANT
CATH INTERMIT  6FR 70CM (CATHETERS) ×1 IMPLANT
CLOTH BEACON ORANGE TIMEOUT ST (SAFETY) ×1 IMPLANT
EXTRACTOR STONE NITINOL NGAGE (UROLOGICAL SUPPLIES) IMPLANT
FORCEPS BIOP 2.4F 115CM BACKLD (INSTRUMENTS) IMPLANT
GLOVE BIO SURGEON STRL SZ8 (GLOVE) ×1 IMPLANT
GLOVE BIOGEL PI IND STRL 7.0 (GLOVE) ×2 IMPLANT
GOWN STRL REUS W/TWL LRG LVL3 (GOWN DISPOSABLE) ×1 IMPLANT
GOWN STRL REUS W/TWL XL LVL3 (GOWN DISPOSABLE) ×1 IMPLANT
GUIDEWIRE STR DUAL SENSOR (WIRE) ×1 IMPLANT
GUIDEWIRE STR ZIPWIRE 035X150 (MISCELLANEOUS) ×1 IMPLANT
IV NS IRRIG 3000ML ARTHROMATIC (IV SOLUTION) ×2 IMPLANT
KIT TURNOVER CYSTO (KITS) ×1 IMPLANT
MANIFOLD NEPTUNE II (INSTRUMENTS) ×1 IMPLANT
PACK CYSTO (CUSTOM PROCEDURE TRAY) ×1 IMPLANT
PAD ARMBOARD 7.5X6 YLW CONV (MISCELLANEOUS) ×1 IMPLANT
SHEATH ACCESS URETERAL 24CM (SHEATH) IMPLANT
STENT URET 6FRX26 CONTOUR (STENTS) IMPLANT
TOWEL NATURAL 4PK STERILE (DISPOSABLE) ×1 IMPLANT
WATER STERILE IRR 500ML POUR (IV SOLUTION) ×1 IMPLANT

## 2022-09-06 NOTE — H&P (Signed)
HPI: Andrew [name] is a 71yo here for followup for bladder cancer and new left renal mass. Urine cytology was inconclusive. Renal US 06/21/2022 shows new mild left hydronephrosis without mass. He denies any flank pain. No hematuria     PMH:     Past Medical History:  Diagnosis Date   Acute viral pericarditis 09/03/2018    Inferior STE - but Negative Troponin.  CP &SOB.  Felt to be Viral.   Anemia     Arthritis     Bladder cancer (HCC) 2014   CAD (coronary artery disease)     Cataract      bilateral   Depression     Diabetes mellitus without complication (HCC)     Essential hypertension 01/20/2018    in the past no longer on medication   GERD (gastroesophageal reflux disease)     Hx of adenomatous colonic polyps 09/2017    colonoscopy   Hyperlipidemia with target LDL less than 70 12/19/2015   Intraoperative floppy iris syndrome (IFIS) 07/2019   Multivessel CAD - CTO dLAD, mCx. DES PCI RI, PTCA of RPDA 01/21/2018    12/2017 - Cath for ? STEMI -> distal/apical LAD & m-dCx CTO (unable to cross Cx).  Mod rPDA. Severe RI - DES PCI.  08/2018 - Cath for ? Inf STEMI - RI stent patent & CTO dLAD/mCx. Progression of rPDA 95% -> PTCA only.  Thought to be Pericarditis & not MI (troponin negative).    Neuromuscular disorder (HCC)     Sleep apnea      BiPAP not currently being used   Status post tendon repair 1989   STEMI (ST elevation myocardial infarction) (HCC) 01/20/2018    Cardiac cath January 21, 2018: Severe multivessel disease with unclear lesion, but opted for PCI/DES x1 to the RI.  Appeared to have CTO of the distal LAD and circumflex as well as moderate mid LAD and diagonal disease as well as PDA.Marland Kitchen  Unable to cross circumflex lesion.   Stroke (HCC) 11/2015    At times pt has dizziness with loss of vision   Tremors of nervous system 2011      Surgical History:      Past Surgical History:  Procedure Laterality Date   APPENDECTOMY       BLADDER SURGERY       COLONOSCOPY       CORONARY  STENT INTERVENTION N/A 01/21/2018    Procedure: CORONARY STENT INTERVENTION;  Surgeon: Marykay Lex, MD;  Location: [facility] INVASIVE CV LAB;  Service: Cardiovascular;  Laterality: N/A;  95% Ramus Intermedius -PCI with synergy DES 2.25 mm x 12 mm postdilated 2.4 mm.   CORONARY/GRAFT ACUTE MI REVASCULARIZATION N/A 01/21/2018    Procedure: Coronary/Graft Acute MI Revascularization;  Surgeon: Marykay Lex, MD;  Location: [facility] INVASIVE CV LAB;  Service: Cardiovascular;  Laterality: N/A;  attempted revas to distal CFX;    CORONARY/GRAFT ACUTE MI REVASCULARIZATION N/A 09/04/2018    Procedure: Coronary/Graft Acute MI Revascularization;  Surgeon: Tonny Bollman, MD;  Location: [facility] INVASIVE CV LAB:: PTCA/POBA of rPDA (2.0 mm balloon)   CYSTOSCOPY N/A 01/21/2020    Procedure: CYSTOSCOPY;  Surgeon: Malen Gauze, MD;  Location: [facility];  Service: Urology;  Laterality: N/A;   CYSTOSCOPY N/A 02/09/2021    Procedure: CYSTOSCOPY;  Surgeon: Malen Gauze, MD;  Location: [facility];  Service: Urology;  Laterality: N/A;   CYSTOSCOPY W/ RETROGRADES Bilateral 08/22/2015    Procedure: CYSTOSCOPY WITH RETROGRADE PYELOGRAM;  Surgeon: Bjorn Pippin, MD;  Location: [facility];  Service: Urology;  Laterality: Bilateral;   CYSTOSCOPY W/ RETROGRADES Bilateral 01/16/2016    Procedure: CYSTOSCOPY WITH RETROGRADE PYELOGRAM;  Surgeon: Bjorn Pippin, MD;  Location: [facility];  Service: Urology;  Laterality: Bilateral;   CYSTOSCOPY W/ RETROGRADES Bilateral 01/07/2017    Procedure: CYSTOSCOPY WITH RETROGRADE PYELOGRAM;  Surgeon: Bjorn Pippin, MD;  Location: [facility];  Service: Urology;  Laterality: Bilateral;   CYSTOSCOPY WITH BIOPSY N/A 08/22/2015    Procedure: CYSTOSCOPY WITH BLADDER BIOPSY;  Surgeon: Bjorn Pippin, MD;  Location: [facility];  Service: Urology;  Laterality: N/A;   CYSTOSCOPY WITH BIOPSY N/A 01/16/2016    Procedure: CYSTOSCOPY WITH BLADDER BIOPSY;  Surgeon: Bjorn Pippin, MD;  Location: [facility];  Service: Urology;  Laterality: N/A;    CYSTOSCOPY WITH FULGERATION N/A 01/16/2016    Procedure: CYSTOSCOPY WITH FULGERATION;  Surgeon: Bjorn Pippin, MD;  Location: [facility];  Service: Urology;  Laterality: N/A;   KNEE ARTHROSCOPY Right 1989   LEFT HEART CATH AND CORONARY ANGIOGRAPHY N/A 01/21/2018    Procedure: LEFT HEART CATH AND CORONARY ANGIOGRAPHY;  Surgeon: Marykay Lex, MD;  Location: [facility] INVASIVE CV LAB;  Service: Cardiovascular;; 95% RI-DES PCI.  80% o-pCX (PTCA) -> dCX 80%-100% CTO (unsuccessful PTCA unable to cross distal lesion with balloon.)  100% CTO dLAD. RPDA 80% and 70% as well as P AV 180%, PA V2 60%.  (too small for PCI)   LEFT HEART CATH AND CORONARY ANGIOGRAPHY N/A 09/04/2018    Procedure: LEFT HEART CATH AND CORONARY ANGIOGRAPHY;  Surgeon: Tonny Bollman, MD;  Location: [facility] INVASIVE CV LAB: (presumed Inferior STEMI) = progression of small rPDA -95% (PTCA only). Patent RI stent. CTO of apical LAD & mCx.   ROTATOR CUFF REPAIR Bilateral 2000   rt. leg fracture surgery       TRANSTHORACIC ECHOCARDIOGRAM   01/21/2018    Mild LVH.  EF 50 and 55%.  Apical inferior hypokinesis.  Mid-apical anterolateral hypokinesis.  Normal diastolic function for age    TRANSTHORACIC ECHOCARDIOGRAM   09/04/2018    -? Inf STEMI vs. Pericarditis:  Mildly reduced EF of 45 to 50%.  Impaired relaxation-GR 1 DD.  Severe hypokinesis of the anterolateral and inferolateral wall.  Small-moderate anterior pericardial effusion.  Moderate aortic sclerosis but no stenosis.   TRANSURETHRAL RESECTION OF BLADDER TUMOR N/A 01/07/2017    Procedure: TRANSURETHRAL RESECTION OF BLADDER TUMOR (TURBT);  Surgeon: Bjorn Pippin, MD;  Location: [facility];  Service: Urology;  Laterality: N/A;   TRANSURETHRAL RESECTION OF BLADDER TUMOR N/A 01/21/2020    Procedure: TRANSURETHRAL RESECTION OF BLADDER TUMOR (TURBT);  Surgeon: Malen Gauze, MD;  Location: [facility];  Service: Urology;  Laterality: N/A;   TRANSURETHRAL RESECTION OF BLADDER TUMOR N/A 02/09/2021    Procedure:  TRANSURETHRAL RESECTION OF BLADDER TUMOR (TURBT);  Surgeon: Malen Gauze, MD;  Location: [facility];  Service: Urology;  Laterality: N/A;   WISDOM TOOTH EXTRACTION          Home Medications:  Allergies as of 07/05/2022         Reactions    Tramadol Shortness Of Breath    And dizziness    Trulicity [dulaglutide] Swelling    Ankles and feet swell            Medication List           Accurate as of July 05, 2022 11:59 PM. If you have any questions, ask your nurse or doctor.  Basaglar KwikPen 100 UNIT/ML Inject 12 Units into the skin daily.    clopidogrel 75 MG tablet Commonly known as: PLAVIX Take 75 mg by mouth once.    empagliflozin 25 MG Tabs tablet Commonly known as: JARDIANCE Take 25 mg by mouth daily.    fenofibrate 48 MG tablet Commonly known as: Tricor Take 1 tablet (48 mg total) by mouth daily.    Fiber Choice Fruity Bites 1.5 g Chew Generic drug: Inulin Take daily as directed    isosorbide mononitrate 30 MG 24 hr tablet Commonly known as: IMDUR Take 1 tablet (30 mg total) by mouth daily. What changed:  how much to take when to take this    ketoconazole 2 % cream Commonly known as: NIZORAL Apply 1 application topically daily as needed for irritation.    LUBRICATING EYE DROPS OP Place 1 drop into both eyes daily as needed (dry eyes).    metFORMIN 1000 MG tablet Commonly known as: GLUCOPHAGE Take 1 tablet by mouth 2 times daily with a meal. What changed:  how much to take how to take this when to take this additional instructions    nitroGLYCERIN 0.4 MG SL tablet Commonly known as: NITROSTAT PLACE 1 TABLET UNDER THE TONGUE AT ONSET OF CHEST PAIN EVERY 5 MINTUES UP TO 3 TIMES AS NEEDED    OneTouch Verio test strip Generic drug: glucose blood daily.    oxyCODONE-acetaminophen 10-325 MG tablet Commonly known as: PERCOCET Take 2 tablets by mouth daily as needed for pain.    pantoprazole 40 MG tablet Commonly known as:  PROTONIX Take 1 tablet (40 mg total) by mouth 2 (two) times daily. Please schedule an office visit for further refills. Thank you    primidone 50 MG tablet Commonly known as: MYSOLINE Take 1 tablet (50 mg total) by mouth 4 (four) times daily. What changed: how much to take    propranolol 20 MG tablet Commonly known as: INDERAL Take 20 mg by mouth 2 (two) times daily.    rosuvastatin 40 MG tablet Commonly known as: CRESTOR Take 1 tablet (40 mg total) by mouth daily.    sitaGLIPtin 100 MG tablet Commonly known as: JANUVIA Take 100 mg by mouth daily.    tamsulosin 0.4 MG Caps capsule Commonly known as: FLOMAX Take 1 capsule (0.4 mg total) by mouth daily.             Allergies:       Allergies  Allergen Reactions   Tramadol Shortness Of Breath      And dizziness   Trulicity [Dulaglutide] Swelling      Ankles and feet swell      Family History:      Family History  Problem Relation Age of Onset   Diabetes Mother 29        type 1   Stroke Mother     Dementia Father     Diabetes Brother     Heart attack Brother 48   Testicular cancer Brother     Diabetes Brother     Cancer Brother          testicular   Colon cancer Neg Hx     Colon polyps Neg Hx     Esophageal cancer Neg Hx     Rectal cancer Neg Hx     Stomach cancer Neg Hx     Pancreatic cancer Neg Hx        Social History:  reports that he has been smoking cigarettes. He has  a 50.00 pack-year smoking history. He has quit using smokeless tobacco. He reports that he does not drink alcohol and does not use drugs.   ROS: All other review of systems were reviewed and are negative except what is noted above in HPI     Laboratory Data: Recent Labs       Lab Results  Component Value Date    WBC 8.1 02/05/2021    HGB 16.3 02/05/2021    HCT 48.8 02/05/2021    MCV 96.8 02/05/2021    PLT 221 02/05/2021        Recent Labs       Lab Results  Component Value Date    CREATININE 0.87 02/05/2021         Recent Labs  No results found for: "PSA"     Recent Labs  No results found for: "TESTOSTERONE"     Recent Labs       Lab Results  Component Value Date    HGBA1C 7.1 (H) 02/05/2021        Urinalysis Labs (Brief)          Component Value Date/Time    COLORURINE YELLOW 09/03/2018 0000    APPEARANCEUR Cloudy (A) 05/12/2022 1407    LABSPEC 1.028 09/03/2018 0000    PHURINE 5.0 09/03/2018 0000    GLUCOSEU 3+ (A) 05/12/2022 1407    HGBUR SMALL (A) 09/03/2018 0000    BILIRUBINUR Negative 05/12/2022 1407    KETONESUR NEGATIVE 09/03/2018 0000    PROTEINUR 1+ (A) 05/12/2022 1407    PROTEINUR NEGATIVE 09/03/2018 0000    UROBILINOGEN 0.2 10/15/2019 1606    NITRITE Negative 05/12/2022 1407    NITRITE NEGATIVE 09/03/2018 0000    LEUKOCYTESUR Negative 05/12/2022 1407    LEUKOCYTESUR NEGATIVE 09/03/2018 0000        Recent Labs       Lab Results  Component Value Date    LABMICR See below: 05/12/2022    WBCUA 0-5 05/12/2022    RBCUA None seen 01/15/2016    LABEPIT 0-10 05/12/2022    MUCUS Present 11/16/2021    BACTERIA None seen 05/12/2022        Pertinent Imaging: Renal US 06/21/2022: Images reviewed and discussed with the patient  No results found for this or any previous visit.   No results found for this or any previous visit.   No results found for this or any previous visit.   No results found for this or any previous visit.   Results for orders placed during the hospital encounter of 06/21/22   Ultrasound renal complete   Narrative CLINICAL DATA:  left hydronephrosis   EXAM: RENAL / URINARY TRACT ULTRASOUND COMPLETE   COMPARISON:  September 04, 2018   FINDINGS: Right Kidney:   Renal measurements: 13.1 x 4.5 x 7.6 cm = volume: 232 mL. Echogenicity within normal limits. No mass or hydronephrosis visualized.   Left Kidney:   Assessment is limited secondary to overlapping bowel gas and limited acoustic windows. Renal measurements: 7.3 x 6.3 x 5.5 cm =  volume: 140 mL. Echogenicity within normal limits. No mass visualized. There is mild LEFT-sided pelviectasis without frank hydronephrosis.   Bladder:   Appears normal for degree of bladder distention.   Other:   None.   IMPRESSION: There is mild LEFT-sided pelviectasis without frank hydronephrosis.   If concern for obstructive physiology, recommend dedicated CT scan abdomen pelvis with and without contrast.     Electronically Signed By: Meda Klinefelter M.D. On:  06/21/2022 16:11   No valid procedures specified. No results found for this or any previous visit.   Results for orders placed during the hospital encounter of 01/14/17   CT RENAL STONE STUDY   Narrative CLINICAL DATA:  Sharp back pain and hematuria. Recent bladder tumor removed 1 week ago.   EXAM: CT ABDOMEN AND PELVIS WITHOUT CONTRAST   TECHNIQUE: Multidetector CT imaging of the abdomen and pelvis was performed following the standard protocol without IV contrast.   COMPARISON:  None.   FINDINGS: Lower chest: No acute abnormality. Right coronary artery atherosclerotic vascular calcifications.   Hepatobiliary: Hepatic steatosis. Scattered punctate granulomas throughout the liver, consistent with prior granulomatous disease. The gallbladder is decompressed. No biliary dilatation.   Pancreas: Unremarkable. No pancreatic ductal dilatation or surrounding inflammatory changes.   Spleen: Normal size without focal abnormality. Multiple punctate calcifications within the spleen, consistent with prior granulomatous disease.   Adrenals/Urinary Tract: The adrenal glands are unremarkable. No renal or ureteral calculi. Minimal left greater than right perinephric fat stranding, nonspecific. No hydronephrosis. The bladder is unremarkable.   Stomach/Bowel: Stomach is within normal limits. Appendix is surgically absent. No evidence of bowel wall thickening, distention, or inflammatory changes. Scattered  left-sided colonic diverticulosis.   Vascular/Lymphatic: Aortic atherosclerosis. No enlarged abdominal or pelvic lymph nodes.   Reproductive: Prostate is unremarkable.   Other: No abdominal wall hernia or abnormality. No abdominopelvic ascites.   Musculoskeletal: No acute osseous abnormality. Degenerative changes of the thoracolumbar spine with severe lower lumbar facet arthropathy. 6 mm anterolisthesis of L4 on L5.   IMPRESSION: 1. No evidence of acute intra-abdominal process. No explanation for the patient's symptoms. 2. No renal or ureteral calculi.  No hydronephrosis. 3. Hepatic steatosis. 4.  Aortic atherosclerosis (ICD10-I70.0).     Electronically Signed By: Obie Dredge M.D. On: 01/14/2017 08:52     Assessment & Plan:     Left renal mass -I discussed the management of renal masses in patients with a history of bladder cancer. We discussed observation versus diagnostic ureteroscopy and after discussing the options the patient elects for diagnostic ureteroscopy. Risks/benefits/alternitives discussed,  No follow-ups on file.   Wilkie Aye, MD   [facility] [place]

## 2022-09-06 NOTE — Anesthesia Procedure Notes (Signed)
Procedure Name: LMA Insertion Date/Time: 09/06/2022 11:18 AM  Performed by: Lorin Glass, CRNAPre-anesthesia Checklist: Emergency Drugs available, Patient identified, Suction available and Patient being monitored Patient Re-evaluated:Patient Re-evaluated prior to induction Oxygen Delivery Method: Circle system utilized Preoxygenation: Pre-oxygenation with 100% oxygen Induction Type: IV induction LMA: LMA inserted LMA Size: 4.0 Number of attempts: 1 Placement Confirmation: positive ETCO2 and breath sounds checked- equal and bilateral Tube secured with: Tape Dental Injury: Teeth and Oropharynx as per pre-operative assessment

## 2022-09-06 NOTE — Progress Notes (Signed)
Patient complained of left testicular pain.  Dr. Ronne Binning was notified.  Dr. Ronne Binning stated that the pain is referred pain from the uretal stent that was placed.  Dark bloody urine was also reported to Dr. Ronne Binning.  He stated that the blood is expected given that the kidney was biopsied.  Patient was given two 0.25 mcg-doses of Fentanyl.  See [facility]

## 2022-09-06 NOTE — Anesthesia Preprocedure Evaluation (Addendum)
Anesthesia Evaluation  Patient identified by MRN, date of birth, ID band Patient awake    Reviewed: Allergy & Precautions, H&P , NPO status , Patient's Chart, lab work & pertinent test results  History of Anesthesia Complications Negative for: history of anesthetic complications  Airway Mallampati: II  TM Distance: >3 FB Neck ROM: Full    Dental  (+) Missing, Dental Advisory Given   Pulmonary sleep apnea , COPD, Patient abstained from smoking., former smoker   Pulmonary exam normal breath sounds clear to auscultation       Cardiovascular Exercise Tolerance: Good hypertension, Pt. on medications + angina  + CAD, + Past MI and + Cardiac Stents  Normal cardiovascular exam Rhythm:Regular Rate:Normal   1. The left ventricle has mildly reduced systolic function, with an  ejection fraction of 45-50%. The cavity size was normal. Left ventricular  diastolic Doppler parameters are consistent with impaired relaxation.   2. Severe hypokinesis of the anterolateral and inferolateral myocardium.   3. The right ventricle has normal systolc function. The cavity was  normal. There is no increase in right ventricular wall thickness. Right  ventricular systolic pressure could not be assessed.   4. Small pericardial effusion.   5. The pericardial effusion is anterior to the right ventricle.   6. Small to moderate pericardial effusion measuring up to 1.2 cm. There  is no collapse of the RA/RV and no significant respiratory flow  variability. Findings are not consistent with tamponade.   7. Moderate thickening of the aortic valve Moderate calcification of the  aortic valve. Aortic valve regurgitation was not assessed by color flow  Doppler.   8. Pulmonic valve regurgitation was not assessed by color flow Doppler.   9. The inferior vena cava was dilated in size with <50% respiratory  variability.     Neuro/Psych  PSYCHIATRIC DISORDERS  Depression      Neuromuscular disease CVA    GI/Hepatic Neg liver ROS,GERD  Medicated and Controlled,,  Endo/Other  diabetes, Well Controlled, Type 2, Oral Hypoglycemic Agents, Insulin DependentHypothyroidism    Renal/GU negative Renal ROS Bladder dysfunction (bladder cancer)      Musculoskeletal  (+) Arthritis , Osteoarthritis,    Abdominal   Peds negative pediatric ROS (+)  Hematology  (+) Blood dyscrasia, anemia   Anesthesia Other Findings   Reproductive/Obstetrics negative OB ROS                             Anesthesia Physical Anesthesia Plan  ASA: 3  Anesthesia Plan: General   Post-op Pain Management: Dilaudid IV   Induction: Intravenous  PONV Risk Score and Plan: 4 or greater and Ondansetron and Dexamethasone  Airway Management Planned: LMA  Additional Equipment:   Intra-op Plan:   Post-operative Plan: Extubation in OR  Informed Consent: I have reviewed the patients History and Physical, chart, labs and discussed the procedure including the risks, benefits and alternatives for the proposed anesthesia with the patient or authorized representative who has indicated his/her understanding and acceptance.     Dental advisory given  Plan Discussed with: CRNA and Surgeon  Anesthesia Plan Comments:         Anesthesia Quick Evaluation

## 2022-09-06 NOTE — Discharge Instructions (Addendum)
Plan: Patient is to be discharged home as to follow-up in one week for pathology discussing. He is to remove his stent in 72 hours by pulling the string

## 2022-09-06 NOTE — Anesthesia Postprocedure Evaluation (Signed)
Anesthesia Post Note  Patient: TKAI KILLEY  Procedure(s) Performed: CYSTOSCOPY/RETROGRADE/URETEROSCOPY/STENT PLACEMENT (Left: Ureter) URETERAL BIOPSY/fulgeration (Left: Ureter)  Patient location during evaluation: PACU Anesthesia Type: General Level of consciousness: awake and alert and oriented Pain management: pain level controlled Vital Signs Assessment: post-procedure vital signs reviewed and stable Respiratory status: spontaneous breathing, nonlabored ventilation and respiratory function stable Cardiovascular status: blood pressure returned to baseline and stable Postop Assessment: no apparent nausea or vomiting Anesthetic complications: no  No notable events documented.   Last Vitals:  Vitals:   09/06/22 1556 09/06/22 1645  BP:  116/77  Pulse: 90 97  Resp: 13   Temp:  36.5 C  SpO2: 97% 96%    Last Pain:  Vitals:   09/06/22 1645  TempSrc: Oral  PainSc: 4                  Jveon Pound C Jonthan Leite

## 2022-09-06 NOTE — Transfer of Care (Signed)
Immediate Anesthesia Transfer of Care Note  Patient: Andrew [name]  Procedure(s) Performed: CYSTOSCOPY/RETROGRADE/URETEROSCOPY/STENT PLACEMENT (Left: Ureter) URETERAL BIOPSY/fulgeration (Left: Ureter)  Patient Location: PACU  Anesthesia Type:General  Level of Consciousness: drowsy  Airway & Oxygen Therapy: Patient Spontanous Breathing and Patient connected to nasal cannula oxygen  Post-op Assessment: Report given to RN and Post -op Vital signs reviewed and stable  Post vital signs: Reviewed and stable  Last Vitals:  Vitals Value Taken Time  BP 93/56 09/06/22 1200  Temp 97.7   Pulse 106 09/06/22 1203  Resp 12 09/06/22 1203  SpO2 97 % 09/06/22 1203  Vitals shown include unvalidated device data.  Last Pain:  Vitals:   09/06/22 0931  TempSrc: Oral  PainSc: 0-No pain         Complications: No notable events documented.

## 2022-09-06 NOTE — Progress Notes (Signed)
Patient began scratching his bilateral out thighs and groin about 5 minutes after entering PACU.  Anesthesia was notified and patient was given 25 mg Benadryl.  Patient continued to scratch for about 10 minutes.  Another 25 mg Benadryl and 4 mg Decadron were ordered and given per Dr. Alva Garnet.  Patient began to rest and was lest itchy.

## 2022-09-06 NOTE — Op Note (Signed)
.  Preoperative diagnosis: Left renal tumor  Postoperative diagnosis: Same  Procedure: 1 cystoscopy 2. Left retrograde pyelography 3.  Intraoperative fluoroscopy, under one hour, with interpretation 4.  Left diagnostic ureteroscopy with biopsy of the renal pelvis 5.  Left 6 x 26 JJ stent placement  Attending: Cleda Mccreedy  Anesthesia: General  Estimated blood loss: None  Drains: Left 6 x 26 JJ ureteral stent with tether  Specimens: Left renal pelvis biopsies  Antibiotics: ancef  Findings:  No hydronephrosis. No masses/lesions in the bladder. Ureteral orifices in normal anatomic location. Papillary tumor involving entire renal pelvis and calices.  Indications: Patient is a 72 year old male with a history of bladder cancer who was found to have a possible renal pelvis mass on CT and renal US. After discussing treatment options, he decided proceed with left diagnostic ureteroscopy with possible renal biopsy  Procedure in detail: The patient was brought to the operating room and a brief timeout was done to ensure correct patient, correct procedure, correct site.  General anesthesia was administered patient was placed in dorsal lithotomy position.  His genitalia was then prepped and draped in usual sterile fashion.  A rigid 22 French cystoscope was passed in the urethra and the bladder.  Bladder was inspected free masses or lesions.  the ureteral orifices were in the normal orthotopic locations.  a 6 french ureteral catheter was then instilled into the left ureteral orifice.  a gentle retrograde was obtained and findings noted above.  we then placed a zip wire through the ureteral catheter and advanced up to the renal pelvis.  we then removed the cystoscope and cannulated the left ureteral orifice with a semirigid ureteroscope.  No stone or tumor was found in the ureter. Once we reached the UPJ a sensor wire was advanced in to the renal pelvis. We then removed the ureteroscope and advanced am  12/14 x 38cm access sheath up to the renal pelvis. We then used the flexible ureteroscope to perform nephroscopy. We encountered papillary tumor involving the entire renal pelvis. Using the BIGopsy we obtained multiple biopsies of the renal pelvis.  Once this wa complete we then removed the access sheath under direct vision and noted no injury to the ureter. We then placed a 6 x 26 double-j ureteral stent over the original zip wire.  We then removed the wire and good coil was noted in the the renal pelvis under fluoroscopy and the bladder under direct vision. the bladder was then drained and this concluded the procedure which was well tolerated by patient.  Complications: None  Condition: Stable, extubated, transferred to PACU  Plan: Patient is to be discharged home as to follow-up in one week for pathology discussing. He is to remove his stent in 72 hours by pulling the string

## 2022-09-07 LAB — SURGICAL PATHOLOGY

## 2022-09-09 ENCOUNTER — Ambulatory Visit: Payer: Medicare Other

## 2022-09-10 ENCOUNTER — Encounter (HOSPITAL_COMMUNITY): Payer: Self-pay | Admitting: Urology

## 2022-09-13 ENCOUNTER — Ambulatory Visit: Payer: Medicare Other | Admitting: Urology

## 2022-09-13 ENCOUNTER — Encounter: Payer: Self-pay | Admitting: Urology

## 2022-09-13 VITALS — BP 110/73 | HR 89 | Ht 76.0 in | Wt 185.0 lb

## 2022-09-13 DIAGNOSIS — C679 Malignant neoplasm of bladder, unspecified: Secondary | ICD-10-CM

## 2022-09-13 DIAGNOSIS — N2889 Other specified disorders of kidney and ureter: Secondary | ICD-10-CM

## 2022-09-13 LAB — URINALYSIS, ROUTINE W REFLEX MICROSCOPIC
Bilirubin, UA: NEGATIVE
Ketones, UA: NEGATIVE
Leukocytes,UA: NEGATIVE
Nitrite, UA: NEGATIVE
Specific Gravity, UA: 1.01 (ref 1.005–1.030)
Urobilinogen, Ur: 0.2 mg/dL (ref 0.2–1.0)
pH, UA: 5 (ref 5.0–7.5)

## 2022-09-13 LAB — MICROSCOPIC EXAMINATION
Bacteria, UA: NONE SEEN
RBC, Urine: >30 /hpf — AB (ref 0–2)

## 2022-09-13 NOTE — Patient Instructions (Signed)

## 2022-09-13 NOTE — Progress Notes (Unsigned)
09/13/2022 9:43 AM   Andrew [name] 29-Sep-1950 [id number]  Referring provider: Stevphen Rochester, MD [address] [place],  [place] [address]  Followup left renal mass   HPI: Andrew [name] is a 71yo here for followup after left ureteroscopy and renal pelvis biopsy. He removed his stent POD#3. Pathology is high grade TCC. He has mild left flank pain and mild left testicular pain. His hematuria resolved yesterday.    PMH: Past Medical History:  Diagnosis Date   Acute viral pericarditis 09/03/2018   Inferior STE - but Negative Troponin.  CP &SOB.  Felt to be Viral.   Anemia    Arthritis    Bladder cancer 2014   CAD (coronary artery disease)    Cataract    bilateral   Depression    Diabetes mellitus without complication    Essential hypertension 01/20/2018   in the past no longer on medication   GERD (gastroesophageal reflux disease)    Hx of adenomatous colonic polyps 09/2017   colonoscopy   Hyperlipidemia with target LDL less than 70 12/19/2015   Intraoperative floppy iris syndrome (IFIS) 07/2019   Multivessel CAD - CTO dLAD, mCx. DES PCI RI, PTCA of RPDA 01/21/2018   12/2017 - Cath for ? STEMI -> distal/apical LAD & m-dCx CTO (unable to cross Cx).  Mod rPDA. Severe RI - DES PCI.  08/2018 - Cath for ? Inf STEMI - RI stent patent & CTO dLAD/mCx. Progression of rPDA 95% -> PTCA only.  Thought to be Pericarditis & not MI (troponin negative).    Neuromuscular disorder    Sleep apnea    BiPAP not currently being used   Status post tendon repair 1989   STEMI (ST elevation myocardial infarction) 01/20/2018   Cardiac cath January 21, 2018: Severe multivessel disease with unclear lesion, but opted for PCI/DES x1 to the RI.  Appeared to have CTO of the distal LAD and circumflex as well as moderate mid LAD and diagonal disease as well as PDA.Marland Kitchen  Unable to cross circumflex lesion.   STEMI (ST elevation myocardial infarction) 03/2022   Stroke 11/2015   At times pt has  dizziness with loss of vision   Tremors of nervous system 2011    Surgical History: Past Surgical History:  Procedure Laterality Date   APPENDECTOMY     BLADDER SURGERY     COLONOSCOPY     CORONARY STENT INTERVENTION N/A 01/21/2018   Procedure: CORONARY STENT INTERVENTION;  Surgeon: Marykay Lex, MD;  Location: [facility] INVASIVE CV LAB;  Service: Cardiovascular;  Laterality: N/A;  95% Ramus Intermedius -PCI with synergy DES 2.25 mm x 12 mm postdilated 2.4 mm.   CORONARY/GRAFT ACUTE MI REVASCULARIZATION N/A 01/21/2018   Procedure: Coronary/Graft Acute MI Revascularization;  Surgeon: Marykay Lex, MD;  Location: [facility] INVASIVE CV LAB;  Service: Cardiovascular;  Laterality: N/A;  attempted revas to distal CFX;    CORONARY/GRAFT ACUTE MI REVASCULARIZATION N/A 09/04/2018   Procedure: Coronary/Graft Acute MI Revascularization;  Surgeon: Tonny Bollman, MD;  Location: [facility] INVASIVE CV LAB:: PTCA/POBA of rPDA (2.0 mm balloon)   CYSTOSCOPY N/A 01/21/2020   Procedure: CYSTOSCOPY;  Surgeon: Malen Gauze, MD;  Location: [facility];  Service: Urology;  Laterality: N/A;   CYSTOSCOPY N/A 02/09/2021   Procedure: CYSTOSCOPY;  Surgeon: Malen Gauze, MD;  Location: [facility];  Service: Urology;  Laterality: N/A;   CYSTOSCOPY W/ RETROGRADES Bilateral 08/22/2015   Procedure: CYSTOSCOPY WITH RETROGRADE PYELOGRAM;  Surgeon: Bjorn Pippin,  MD;  Location: [facility];  Service: Urology;  Laterality: Bilateral;   CYSTOSCOPY W/ RETROGRADES Bilateral 01/16/2016   Procedure: CYSTOSCOPY WITH RETROGRADE PYELOGRAM;  Surgeon: Bjorn Pippin, MD;  Location: [facility];  Service: Urology;  Laterality: Bilateral;   CYSTOSCOPY W/ RETROGRADES Bilateral 01/07/2017   Procedure: CYSTOSCOPY WITH RETROGRADE PYELOGRAM;  Surgeon: Bjorn Pippin, MD;  Location: [facility];  Service: Urology;  Laterality: Bilateral;   CYSTOSCOPY WITH BIOPSY N/A 08/22/2015   Procedure: CYSTOSCOPY WITH BLADDER BIOPSY;  Surgeon: Bjorn Pippin, MD;  Location: [facility];  Service: Urology;   Laterality: N/A;   CYSTOSCOPY WITH BIOPSY N/A 01/16/2016   Procedure: CYSTOSCOPY WITH BLADDER BIOPSY;  Surgeon: Bjorn Pippin, MD;  Location: [facility];  Service: Urology;  Laterality: N/A;   CYSTOSCOPY WITH FULGERATION N/A 01/16/2016   Procedure: CYSTOSCOPY WITH FULGERATION;  Surgeon: Bjorn Pippin, MD;  Location: [facility];  Service: Urology;  Laterality: N/A;   CYSTOSCOPY/RETROGRADE/URETEROSCOPY Left 09/06/2022   Procedure: CYSTOSCOPY/RETROGRADE/URETEROSCOPY/STENT PLACEMENT;  Surgeon: Malen Gauze, MD;  Location: [facility];  Service: Urology;  Laterality: Left;   KNEE ARTHROSCOPY Right 1989   LEFT HEART CATH AND CORONARY ANGIOGRAPHY N/A 01/21/2018   Procedure: LEFT HEART CATH AND CORONARY ANGIOGRAPHY;  Surgeon: Marykay Lex, MD;  Location: [facility] INVASIVE CV LAB;  Service: Cardiovascular;; 95% RI-DES PCI.  80% o-pCX (PTCA) -> dCX 80%-100% CTO (unsuccessful PTCA unable to cross distal lesion with balloon.)  100% CTO dLAD. RPDA 80% and 70% as well as P AV 180%, PA V2 60%.  (too small for PCI)   LEFT HEART CATH AND CORONARY ANGIOGRAPHY N/A 09/04/2018   Procedure: LEFT HEART CATH AND CORONARY ANGIOGRAPHY;  Surgeon: Tonny Bollman, MD;  Location: [facility] INVASIVE CV LAB: (presumed Inferior STEMI) = progression of small rPDA -95% (PTCA only). Patent RI stent. CTO of apical LAD & mCx.   ROTATOR CUFF REPAIR Bilateral 2000   rt. leg fracture surgery     TRANSTHORACIC ECHOCARDIOGRAM  01/21/2018   Mild LVH.  EF 50 and 55%.  Apical inferior hypokinesis.  Mid-apical anterolateral hypokinesis.  Normal diastolic function for age    TRANSTHORACIC ECHOCARDIOGRAM  09/04/2018   -? Inf STEMI vs. Pericarditis:  Mildly reduced EF of 45 to 50%.  Impaired relaxation-GR 1 DD.  Severe hypokinesis of the anterolateral and inferolateral wall.  Small-moderate anterior pericardial effusion.  Moderate aortic sclerosis but no stenosis.   TRANSURETHRAL RESECTION OF BLADDER TUMOR N/A 01/07/2017   Procedure: TRANSURETHRAL RESECTION OF BLADDER  TUMOR (TURBT);  Surgeon: Bjorn Pippin, MD;  Location: [facility];  Service: Urology;  Laterality: N/A;   TRANSURETHRAL RESECTION OF BLADDER TUMOR N/A 01/21/2020   Procedure: TRANSURETHRAL RESECTION OF BLADDER TUMOR (TURBT);  Surgeon: Malen Gauze, MD;  Location: [facility];  Service: Urology;  Laterality: N/A;   TRANSURETHRAL RESECTION OF BLADDER TUMOR N/A 02/09/2021   Procedure: TRANSURETHRAL RESECTION OF BLADDER TUMOR (TURBT);  Surgeon: Malen Gauze, MD;  Location: [facility];  Service: Urology;  Laterality: N/A;   URETERAL BIOPSY Left 09/06/2022   Procedure: URETERAL BIOPSY/fulgeration;  Surgeon: Malen Gauze, MD;  Location: [facility];  Service: Urology;  Laterality: Left;   WISDOM TOOTH EXTRACTION      Home Medications:  Allergies as of 09/13/2022       Reactions   Tramadol Shortness Of Breath   And dizziness   Trulicity [dulaglutide] Swelling   Ankles and feet swell        Medication List        Accurate as of September 13, 2022  9:43 AM. If you have any questions, ask your nurse or doctor.          aspirin EC 81 MG tablet Take 81 mg by mouth daily. Swallow whole.   clopidogrel 75 MG tablet Commonly known as: PLAVIX Take 75 mg by mouth daily.   empagliflozin 25 MG Tabs tablet Commonly known as: JARDIANCE Take 25 mg by mouth daily.   fenofibrate 48 MG tablet Commonly known as: Tricor Take 1 tablet (48 mg total) by mouth daily.   Fiber Choice Fruity Bites 1.5 g Chew Generic drug: Inulin Take daily as directed   furosemide 40 MG tablet Commonly known as: LASIX Take 20 mg by mouth daily.   glimepiride 2 MG tablet Commonly known as: AMARYL Take 2 mg by mouth daily before breakfast.   isosorbide mononitrate 30 MG 24 hr tablet Commonly known as: IMDUR Take 1 tablet (30 mg total) by mouth daily. What changed:  how much to take when to take this   levothyroxine 50 MCG tablet Commonly known as: SYNTHROID Take 50 mcg by mouth daily before breakfast.    LUBRICATING EYE DROPS OP Place 1 drop into both eyes daily as needed (dry eyes).   metFORMIN 1000 MG tablet Commonly known as: GLUCOPHAGE Take 1 tablet by mouth 2 times daily with a meal. What changed:  how much to take how to take this when to take this additional instructions   metoprolol succinate 25 MG 24 hr tablet Commonly known as: TOPROL-XL Take 25 mg by mouth daily.   nitroGLYCERIN 0.4 MG SL tablet Commonly known as: NITROSTAT PLACE 1 TABLET UNDER THE TONGUE AT ONSET OF CHEST PAIN EVERY 5 MINTUES UP TO 3 TIMES AS NEEDED   OneTouch Verio test strip Generic drug: glucose blood daily.   oxyCODONE-acetaminophen 10-325 MG tablet Commonly known as: PERCOCET Take 2 tablets by mouth daily as needed for pain.   oxyCODONE-acetaminophen 5-325 MG tablet Commonly known as: Percocet Take 1 tablet by mouth every 4 (four) hours as needed for severe pain.   pantoprazole 40 MG tablet Commonly known as: PROTONIX Take 1 tablet (40 mg total) by mouth 2 (two) times daily. Please schedule an office visit for further refills. Thank you   PreserVision AREDS 2 Caps Take 1 capsule by mouth 2 (two) times daily.   primidone 50 MG tablet Commonly known as: MYSOLINE Take 1 tablet (50 mg total) by mouth 4 (four) times daily. What changed: how much to take   ranolazine 500 MG 12 hr tablet Commonly known as: RANEXA Take 500 mg by mouth 2 (two) times daily.   rosuvastatin 40 MG tablet Commonly known as: CRESTOR Take 1 tablet (40 mg total) by mouth daily.   tamsulosin 0.4 MG Caps capsule Commonly known as: FLOMAX Take 1 capsule (0.4 mg total) by mouth daily.   Evaristo Bury FlexTouch 100 UNIT/ML FlexTouch Pen Generic drug: insulin degludec Inject 14 Units into the skin daily.   Vitamin D 50 MCG (2000 UT) tablet Take 2,000 Units by mouth 2 (two) times daily.   Voltaren 1 % Gel Generic drug: diclofenac Sodium Apply 1 Application topically 4 (four) times daily as needed (pain).         Allergies:  Allergies  Allergen Reactions   Tramadol Shortness Of Breath    And dizziness   Trulicity [Dulaglutide] Swelling    Ankles and feet swell    Family History: Family History  Problem Relation Age of Onset   Diabetes Mother 78  type 1   Stroke Mother    Dementia Father    Diabetes Brother    Heart attack Brother 15   Testicular cancer Brother    Diabetes Brother    Cancer Brother        testicular   Colon cancer Neg Hx    Colon polyps Neg Hx    Esophageal cancer Neg Hx    Rectal cancer Neg Hx    Stomach cancer Neg Hx    Pancreatic cancer Neg Hx     Social History:  reports that he has quit smoking. His smoking use included cigarettes. He has a 50.00 pack-year smoking history. He has quit using smokeless tobacco. He reports that he does not drink alcohol and does not use drugs.  ROS: All other review of systems were reviewed and are negative except what is noted above in HPI  Physical Exam: BP 110/73   Pulse 89   Ht 6\' 4"  (1.93 m)   Wt 185 lb (83.9 kg)   BMI 22.52 kg/m   Constitutional:  Alert and oriented, No acute distress. HEENT: [place] AT, moist mucus membranes.  Trachea midline, no masses. Cardiovascular: No clubbing, cyanosis, or edema. Respiratory: Normal respiratory effort, no increased work of breathing. GI: Abdomen is soft, nontender, nondistended, no abdominal masses GU: No CVA tenderness.  Lymph: No cervical or inguinal lymphadenopathy. Skin: No rashes, bruises or suspicious lesions. Neurologic: Grossly intact, no focal deficits, moving all 4 extremities. Psychiatric: Normal mood and affect.  Laboratory Data: Lab Results  Component Value Date   WBC 8.9 09/01/2022   HGB 15.5 09/01/2022   HCT 46.5 09/01/2022   MCV 95.9 09/01/2022   PLT 212 09/01/2022    Lab Results  Component Value Date   CREATININE 0.98 09/01/2022    No results found for: "PSA"  No results found for: "TESTOSTERONE"  Lab Results  Component Value Date    HGBA1C 8.1 (H) 09/01/2022    Urinalysis    Component Value Date/Time   COLORURINE YELLOW 09/03/2018 0000   APPEARANCEUR Cloudy (A) 05/12/2022 1407   LABSPEC 1.028 09/03/2018 0000   PHURINE 5.0 09/03/2018 0000   GLUCOSEU 3+ (A) 05/12/2022 1407   HGBUR SMALL (A) 09/03/2018 0000   BILIRUBINUR Negative 05/12/2022 1407   KETONESUR NEGATIVE 09/03/2018 0000   PROTEINUR 1+ (A) 05/12/2022 1407   PROTEINUR NEGATIVE 09/03/2018 0000   UROBILINOGEN 0.2 10/15/2019 1606   NITRITE Negative 05/12/2022 1407   NITRITE NEGATIVE 09/03/2018 0000   LEUKOCYTESUR Negative 05/12/2022 1407   LEUKOCYTESUR NEGATIVE 09/03/2018 0000    Lab Results  Component Value Date   LABMICR See below: 05/12/2022   WBCUA 0-5 05/12/2022   RBCUA None seen 01/15/2016   LABEPIT 0-10 05/12/2022   MUCUS Present 11/16/2021   BACTERIA None seen 05/12/2022    Pertinent Imaging:  No results found for this or any previous visit.  No results found for this or any previous visit.  No results found for this or any previous visit.  No results found for this or any previous visit.  Results for orders placed during the hospital encounter of 06/21/22  Ultrasound renal complete  Narrative CLINICAL DATA:  left hydronephrosis  EXAM: RENAL / URINARY TRACT ULTRASOUND COMPLETE  COMPARISON:  September 04, 2018  FINDINGS: Right Kidney:  Renal measurements: 13.1 x 4.5 x 7.6 cm = volume: 232 mL. Echogenicity within normal limits. No mass or hydronephrosis visualized.  Left Kidney:  Assessment is limited secondary to overlapping bowel gas and limited acoustic  windows. Renal measurements: 7.3 x 6.3 x 5.5 cm = volume: 140 mL. Echogenicity within normal limits. No mass visualized. There is mild LEFT-sided pelviectasis without frank hydronephrosis.  Bladder:  Appears normal for degree of bladder distention.  Other:  None.  IMPRESSION: There is mild LEFT-sided pelviectasis without frank hydronephrosis.  If concern for  obstructive physiology, recommend dedicated CT scan abdomen pelvis with and without contrast.   Electronically Signed By: Meda Klinefelter M.D. On: 06/21/2022 16:11  No valid procedures specified. No results found for this or any previous visit.  Results for orders placed during the hospital encounter of 01/14/17  CT RENAL STONE STUDY  Narrative CLINICAL DATA:  Sharp back pain and hematuria. Recent bladder tumor removed 1 week ago.  EXAM: CT ABDOMEN AND PELVIS WITHOUT CONTRAST  TECHNIQUE: Multidetector CT imaging of the abdomen and pelvis was performed following the standard protocol without IV contrast.  COMPARISON:  None.  FINDINGS: Lower chest: No acute abnormality. Right coronary artery atherosclerotic vascular calcifications.  Hepatobiliary: Hepatic steatosis. Scattered punctate granulomas throughout the liver, consistent with prior granulomatous disease. The gallbladder is decompressed. No biliary dilatation.  Pancreas: Unremarkable. No pancreatic ductal dilatation or surrounding inflammatory changes.  Spleen: Normal size without focal abnormality. Multiple punctate calcifications within the spleen, consistent with prior granulomatous disease.  Adrenals/Urinary Tract: The adrenal glands are unremarkable. No renal or ureteral calculi. Minimal left greater than right perinephric fat stranding, nonspecific. No hydronephrosis. The bladder is unremarkable.  Stomach/Bowel: Stomach is within normal limits. Appendix is surgically absent. No evidence of bowel wall thickening, distention, or inflammatory changes. Scattered left-sided colonic diverticulosis.  Vascular/Lymphatic: Aortic atherosclerosis. No enlarged abdominal or pelvic lymph nodes.  Reproductive: Prostate is unremarkable.  Other: No abdominal wall hernia or abnormality. No abdominopelvic ascites.  Musculoskeletal: No acute osseous abnormality. Degenerative changes of the thoracolumbar spine  with severe lower lumbar facet arthropathy. 6 mm anterolisthesis of L4 on L5.  IMPRESSION: 1. No evidence of acute intra-abdominal process. No explanation for the patient's symptoms. 2. No renal or ureteral calculi.  No hydronephrosis. 3. Hepatic steatosis. 4.  Aortic atherosclerosis (ICD10-I70.0).   Electronically Signed By: Obie Dredge M.D. On: 01/14/2017 08:52   Assessment & Plan:    1. Malignant neoplasm of left renal pelvis -We discussed the management of left renal TCC including surveillance, endoscopic resection, and radical nephroureterectomy. After discussing the options the patient elects for left radical nephroureterectomy. Risks/benefits/alternatives discussed - Urinalysis, Routine w reflex microscopic    No follow-ups on file.  Wilkie Aye, MD  [facility] [place]

## 2022-09-16 ENCOUNTER — Ambulatory Visit: Payer: Medicare Other

## 2022-09-16 ENCOUNTER — Telehealth: Payer: Self-pay | Admitting: *Deleted

## 2022-09-16 NOTE — Telephone Encounter (Signed)
   Pre-operative Risk Assessment    Patient Name: Andrew [name]  DOB: 01-02-1951 MRN: [id number]      Request for Surgical Clearance    Procedure:   Robotic Radical Nephroureterectomy.  Date of Surgery:  Clearance 11/08/22                                 Surgeon:  Dr. Ronne Binning Surgeon's Group or Practice Name:  [facility] [place] Phone number:  [phone] Fax number:  [phone]   Type of Clearance Requested:   - Medical  - Pharmacy:  Hold Aspirin and Clopidogrel (Plavix) 5 days Prior.   Type of Anesthesia:  General    Additional requests/questions:    Signed, Emmit Pomfret   09/16/2022, 1:18 PM

## 2022-09-17 NOTE — Telephone Encounter (Signed)
   Patient Name: Andrew [name]  DOB: Sep 26, 1950 MRN: [id number]  Primary Cardiologist: Bryan Lemma, MD  Chart reviewed as part of pre-operative protocol coverage.  Patient follows with [facility] cardiology.  Patient has not been seen by  heart care since 2021.  Therefore, recommendations for surgical clearance should come from primary cardiologist ([facility]).  I will route this recommendation to the requesting party via Epic fax function and remove from pre-op pool.  Please call with questions.  Joylene Grapes, NP 09/17/2022, 12:53 PM

## 2022-09-23 ENCOUNTER — Telehealth: Payer: Self-pay

## 2022-09-23 DIAGNOSIS — N2889 Other specified disorders of kidney and ureter: Secondary | ICD-10-CM

## 2022-09-23 NOTE — Telephone Encounter (Signed)
I spoke with Andrew [name]. We have discussed possible surgery dates and 11/08/2022 was agreed upon by all parties. Patient given information about surgery date, what to expect pre-operatively and post operatively.    We discussed that a pre-op nurse will be calling to set up the pre-op visit that will take place prior to surgery. Informed patient that our office will communicate any additional care to be provided after surgery.    Patients questions or concerns were discussed during our call. Advised to call our office should there be any additional information, questions or concerns that arise. Patient verbalized understanding.

## 2022-09-23 NOTE — Telephone Encounter (Signed)
Patient's daughter called wanting to follow up to see when the procedure would be scheduled for Patient. Per daughter at patient's last appoint they were advised that he would be having a procedure in June. She advised she could be contacted at [phone].   Thank you

## 2022-09-27 ENCOUNTER — Telehealth: Payer: Self-pay

## 2022-09-27 MED ORDER — MAGNESIUM CITRATE PO SOLN: 1.0000 | Freq: Once | ORAL | 0 refills | Status: AC

## 2022-09-27 NOTE — Telephone Encounter (Signed)
I spoke with Andrew [name]. We have discussed possible surgery dates and 11/08/2022 was agreed upon by all parties. Patient given information about surgery date, what to expect pre-operatively and post operatively.    We discussed that a pre-op nurse will be calling to set up the pre-op visit that will take place prior to surgery. Informed patient that our office will communicate any additional care to be provided after surgery.    Patients questions or concerns were discussed during our call. Advised to call our office should there be any additional information, questions or concerns that arise. Patient verbalized understanding.   

## 2022-09-27 NOTE — Addendum Note (Signed)
Addended by: Grier Rocher on: 09/27/2022 12:22 PM   Modules accepted: Orders

## 2022-09-27 NOTE — Telephone Encounter (Signed)
Can you please schedule cystogram prior to post op on 07/03

## 2022-10-01 ENCOUNTER — Other Ambulatory Visit: Payer: Self-pay | Admitting: Gastroenterology

## 2022-10-01 DIAGNOSIS — Z86010 Personal history of colonic polyps: Secondary | ICD-10-CM

## 2022-10-05 ENCOUNTER — Telehealth: Payer: Self-pay | Admitting: Gastroenterology

## 2022-10-05 DIAGNOSIS — Z86010 Personal history of colonic polyps: Secondary | ICD-10-CM

## 2022-10-05 MED ORDER — PANTOPRAZOLE SODIUM 40 MG PO TBEC
40.0000 mg | DELAYED_RELEASE_TABLET | Freq: Two times a day (BID) | ORAL | 0 refills | Status: DC
Start: 2022-10-05 — End: 2022-11-02

## 2022-10-05 NOTE — Telephone Encounter (Signed)
Inbound call from daughter, states patient needs refill on pantoprazole sent to [facility] on file.

## 2022-10-05 NOTE — Telephone Encounter (Signed)
Thirty day supply sent to pharmacy/ Patient needs to keep his June appointment for further refills. Last seen 06-2021

## 2022-10-28 ENCOUNTER — Ambulatory Visit: Payer: Medicare Other | Admitting: Gastroenterology

## 2022-11-01 NOTE — Telephone Encounter (Signed)
Inbound call from patient daughter speak about Protonix. Please advise.

## 2022-11-02 MED ORDER — PANTOPRAZOLE SODIUM 40 MG PO TBEC
40.0000 mg | DELAYED_RELEASE_TABLET | Freq: Two times a day (BID) | ORAL | 1 refills | Status: DC
Start: 2022-11-02 — End: 2023-01-13

## 2022-11-02 NOTE — Telephone Encounter (Signed)
Called [phone] and Lm to call back to discuss Protonix

## 2022-11-02 NOTE — Telephone Encounter (Signed)
Called and spoke to patient. He is having surgery and had to cancel June appointment.  He wants to be seen sooner than Sept however.  Put him on for 8-12 with [facility]. Patient requested refill of Protonix to get to August appointment. Refill sent

## 2022-11-02 NOTE — Addendum Note (Signed)
Addended by: Cooper Render on: 11/02/2022 09:35 AM   Modules accepted: Orders

## 2022-11-03 ENCOUNTER — Other Ambulatory Visit: Payer: Self-pay

## 2022-11-03 DIAGNOSIS — C679 Malignant neoplasm of bladder, unspecified: Secondary | ICD-10-CM

## 2022-11-04 ENCOUNTER — Other Ambulatory Visit: Payer: Medicare Other

## 2022-11-04 ENCOUNTER — Telehealth: Payer: Self-pay

## 2022-11-04 NOTE — Telephone Encounter (Signed)
Patient in office today for lab work, he is concerned with what is causing his platelets to drop.  He voiced frustration because his PCP keeps requesting labs but he does not feel that anyone is trying to find the cause. He states his daughter read that low B12 can cause low platelets and at one time he was having to get b12 injections.   Patient is also asking if he needs to be referred to hematologist.  He is asking if you have any information for him or if a referral is needed if you can send one?  Please advise.

## 2022-11-04 NOTE — Patient Instructions (Addendum)
Andrew [name]  11/04/2022     @PREFPERIOPPHARMACY @   Your procedure is scheduled on 11/08/2022.  Report to [facility] at 6:00 A.M.  Call this number if you have problems the morning of surgery:  [phone]  If you experience any cold or flu symptoms such as cough, fever, chills, shortness of breath, etc. between now and your scheduled surgery, please notify us at the above number.   Remember:    Do not eat or drink after midnight.     Take these medicines the morning of surgery with A SIP OF WATER :  Imdur Synthroid Metoprolol Percocet (if needed)  Protonix Mysoline Ranexa and Flomax   Last dose of Jardiance should be 11/05/2022   Last dose of Plavix should be   Do not take any diabetic medications the morning of the procedure.     Please take only half of your Tresiba dose the night before the procedure.     Do not wear jewelry, make-up or nail polish, including gel polish,  artificial nails, or any other type of covering on natural nails (fingers and  toes).  Do not wear lotions, powders, or perfumes, or deodorant.  Do not shave 48 hours prior to surgery.  Men may shave face and neck.  Do not bring valuables to the hospital.  [facility] is not responsible for any belongings or valuables.  Contacts, dentures or bridgework may not be worn into surgery.  Leave your suitcase in the car.  After surgery it may be brought to your room.  For patients admitted to the hospital, discharge time will be determined by your treatment team.  Patients discharged the day of surgery will not be allowed to drive home.   Name and phone number of your driver:   Family Special instructions:  N/A  Please read over the following fact sheets that you were given. Care and Recovery After Surgery  General Anesthesia, Adult General anesthesia is the use of medicine to make you fall asleep (unconscious) for a medical procedure. General anesthesia must be used for certain procedures. It is  often recommended for surgery or procedures that: Last a long time. Require you to be still or in an unusual position. Are major and can cause blood loss. Affect your breathing. The medicines used for general anesthesia are called general anesthetics. During general anesthesia, these medicines are given along with medicines that: Prevent pain. Control your blood pressure. Relax your muscles. Prevent nausea and vomiting after the procedure. Tell a health care provider about: Any allergies you have. All medicines you are taking, including vitamins, herbs, eye drops, creams, and over-the-counter medicines. Your history of any: Medical conditions you have, including: High blood pressure. Bleeding problems. Diabetes. Heart or lung conditions, such as: Heart failure. Sleep apnea. Asthma. Chronic obstructive pulmonary disease (COPD). Current or recent illnesses, such as: Upper respiratory, chest, or ear infections. Cough or fever. Tobacco or drug use, including marijuana or alcohol use. Depression or anxiety. Surgeries and types of anesthetics you have had. Problems you or family members have had with anesthetic medicines. Whether you are pregnant or may be pregnant. Whether you have any chipped or loose teeth, dentures, caps, bridgework, or issues with your mouth, swallowing, or choking. What are the risks? Your health care provider will talk with you about risks. These may include: Allergic reaction to the medicines. Lung and heart problems. Inhaling food or liquid from the stomach into the lungs (aspiration). Nerve injury. Injury to the lips,  mouth, teeth, or gums. Stroke. Waking up during your procedure and being unable to move. This is rare. These problems are more likely to develop if you are having a major surgery or if you have an advanced or serious medical condition. You can prevent some of these complications by answering all of your health care provider's questions  thoroughly and by following all instructions before your procedure. General anesthesia can cause side effects, including: Nausea or vomiting. A sore throat or hoarseness from the breathing tube. Wheezing or coughing. Shaking chills or feeling cold. Body aches. Sleepiness. Confusion, agitation (delirium), or anxiety. What happens before the procedure? When to stop eating and drinking Follow instructions from your health care provider about what you may eat and drink before your procedure. If you do not follow your health care provider's instructions, your procedure may be delayed or canceled. Medicines Ask your health care provider about: Changing or stopping your regular medicines. These include any diabetes medicines or blood thinners you take. Taking medicines such as aspirin and ibuprofen. These medicines can thin your blood. Do not take them unless your health care provider tells you to. Taking over-the-counter medicines, vitamins, herbs, and supplements. General instructions Do not use any products that contain nicotine or tobacco for at least 4 weeks before the procedure. These products include cigarettes, chewing tobacco, and vaping devices, such as e-cigarettes. If you need help quitting, ask your health care provider. If you brush your teeth on the morning of the procedure, make sure to spit out all of the water and toothpaste. If told by your health care provider, bring your sleep apnea device with you to surgery (if applicable). If you will be going home right after the procedure, plan to have a responsible adult: Take you home from the hospital or clinic. You will not be allowed to drive. Care for you for the time you are told. What happens during the procedure?  An IV will be inserted into one of your veins. You will be given one or more of the following through a face mask or IV: A sedative. This helps you relax. Anesthesia. This will: Numb certain areas of your  body. Make you fall asleep for surgery. After you are unconscious, a breathing tube may be inserted down your throat to help you breathe. This will be removed before you wake up. An anesthesia provider, such as an anesthesiologist, will stay with you throughout your procedure. The anesthesia provider will: Keep you comfortable and safe by continuing to give you medicines and adjusting the amount of medicine that you get. Monitor your blood pressure, heart rate, and oxygen levels to make sure that the anesthetics do not cause any problems. The procedure may vary among health care providers and hospitals. What happens after the procedure? Your blood pressure, temperature, heart rate, breathing rate, and blood oxygen level will be monitored until you leave the hospital or clinic. You will wake up in a recovery area. You may wake up slowly. You may be given medicine to help you with pain, nausea, or any other side effects from the anesthesia. Summary General anesthesia is the use of medicine to make you fall asleep (unconscious) for a medical procedure. Follow your health care provider's instructions about when to stop eating, drinking, or taking certain medicines before your procedure. Plan to have a responsible adult take you home from the hospital or clinic. This information is not intended to replace advice given to you by your health care provider. Make sure  you discuss any questions you have with your health care provider. Document Revised: 08/06/2021 Document Reviewed: 08/06/2021 Elsevier Patient Education  2024 [employer]  How to Use Chlorhexidine Before Surgery Chlorhexidine gluconate (CHG) is a germ-killing (antiseptic) solution that is used to clean the skin. It can get rid of the bacteria that normally live on the skin and can keep them away for about 24 hours. To clean your skin with CHG, you may be given: A CHG solution to use in the shower or as part of a sponge bath. A  prepackaged cloth that contains CHG. Cleaning your skin with CHG may help lower the risk for infection: While you are staying in the intensive care unit of the hospital. If you have a vascular access, such as a central line, to provide short-term or long-term access to your veins. If you have a catheter to drain urine from your bladder. If you are on a ventilator. A ventilator is a machine that helps you breathe by moving air in and out of your lungs. After surgery. What are the risks? Risks of using CHG include: A skin reaction. Hearing loss, if CHG gets in your ears and you have a perforated eardrum. Eye injury, if CHG gets in your eyes and is not rinsed out. The CHG product catching fire. Make sure that you avoid smoking and flames after applying CHG to your skin. Do not use CHG: If you have a chlorhexidine allergy or have previously reacted to chlorhexidine. On babies younger than 71 months of age. How to use CHG solution Use CHG only as told by your health care provider, and follow the instructions on the label. Use the full amount of CHG as directed. Usually, this is one bottle. During a shower Follow these steps when using CHG solution during a shower (unless your health care provider gives you different instructions): Start the shower. Use your normal soap and shampoo to wash your face and hair. Turn off the shower or move out of the shower stream. Pour the CHG onto a clean washcloth. Do not use any type of brush or rough-edged sponge. Starting at your neck, lather your body down to your toes. Make sure you follow these instructions: If you will be having surgery, pay special attention to the part of your body where you will be having surgery. Scrub this area for at least 1 minute. Do not use CHG on your head or face. If the solution gets into your ears or eyes, rinse them well with water. Avoid your genital area. Avoid any areas of skin that have broken skin, cuts, or  scrapes. Scrub your back and under your arms. Make sure to wash skin folds. Let the lather sit on your skin for 1-2 minutes or as long as told by your health care provider. Thoroughly rinse your entire body in the shower. Make sure that all body creases and crevices are rinsed well. Dry off with a clean towel. Do not put any substances on your body afterward--such as powder, lotion, or perfume--unless you are told to do so by your health care provider. Only use lotions that are recommended by the manufacturer. Put on clean clothes or pajamas. If it is the night before your surgery, sleep in clean sheets.  During a sponge bath Follow these steps when using CHG solution during a sponge bath (unless your health care provider gives you different instructions): Use your normal soap and shampoo to wash your face and hair. Pour the CHG onto  a clean washcloth. Starting at your neck, lather your body down to your toes. Make sure you follow these instructions: If you will be having surgery, pay special attention to the part of your body where you will be having surgery. Scrub this area for at least 1 minute. Do not use CHG on your head or face. If the solution gets into your ears or eyes, rinse them well with water. Avoid your genital area. Avoid any areas of skin that have broken skin, cuts, or scrapes. Scrub your back and under your arms. Make sure to wash skin folds. Let the lather sit on your skin for 1-2 minutes or as long as told by your health care provider. Using a different clean, wet washcloth, thoroughly rinse your entire body. Make sure that all body creases and crevices are rinsed well. Dry off with a clean towel. Do not put any substances on your body afterward--such as powder, lotion, or perfume--unless you are told to do so by your health care provider. Only use lotions that are recommended by the manufacturer. Put on clean clothes or pajamas. If it is the night before your surgery, sleep  in clean sheets. How to use CHG prepackaged cloths Only use CHG cloths as told by your health care provider, and follow the instructions on the label. Use the CHG cloth on clean, dry skin. Do not use the CHG cloth on your head or face unless your health care provider tells you to. When washing with the CHG cloth: Avoid your genital area. Avoid any areas of skin that have broken skin, cuts, or scrapes. Before surgery Follow these steps when using a CHG cloth to clean before surgery (unless your health care provider gives you different instructions): Using the CHG cloth, vigorously scrub the part of your body where you will be having surgery. Scrub using a back-and-forth motion for 3 minutes. The area on your body should be completely wet with CHG when you are done scrubbing. Do not rinse. Discard the cloth and let the area air-dry. Do not put any substances on the area afterward, such as powder, lotion, or perfume. Put on clean clothes or pajamas. If it is the night before your surgery, sleep in clean sheets.  For general bathing Follow these steps when using CHG cloths for general bathing (unless your health care provider gives you different instructions). Use a separate CHG cloth for each area of your body. Make sure you wash between any folds of skin and between your fingers and toes. Wash your body in the following order, switching to a new cloth after each step: The front of your neck, shoulders, and chest. Both of your arms, under your arms, and your hands. Your stomach and groin area, avoiding the genitals. Your right leg and foot. Your left leg and foot. The back of your neck, your back, and your buttocks. Do not rinse. Discard the cloth and let the area air-dry. Do not put any substances on your body afterward--such as powder, lotion, or perfume--unless you are told to do so by your health care provider. Only use lotions that are recommended by the manufacturer. Put on clean clothes  or pajamas. Contact a health care provider if: Your skin gets irritated after scrubbing. You have questions about using your solution or cloth. You swallow any chlorhexidine. Call your local poison control center ([phone] in the [place] Get help right away if: Your eyes itch badly, or they become very red or swollen. Your skin itches badly  and is red or swollen. Your hearing changes. You have trouble seeing. You have swelling or tingling in your mouth or throat. You have trouble breathing. These symptoms may represent a serious problem that is an emergency. Do not wait to see if the symptoms will go away. Get medical help right away. Call your local emergency services (911 in the [place] Do not drive yourself to the hospital. Summary Chlorhexidine gluconate (CHG) is a germ-killing (antiseptic) solution that is used to clean the skin. Cleaning your skin with CHG may help to lower your risk for infection. You may be given CHG to use for bathing. It may be in a bottle or in a prepackaged cloth to use on your skin. Carefully follow your health care provider's instructions and the instructions on the product label. Do not use CHG if you have a chlorhexidine allergy. Contact your health care provider if your skin gets irritated after scrubbing. This information is not intended to replace advice given to you by your health care provider. Make sure you discuss any questions you have with your health care provider. Document Revised: 09/07/2021 Document Reviewed: 07/21/2020 Elsevier Patient Education  2023 [employer].

## 2022-11-05 ENCOUNTER — Encounter (HOSPITAL_COMMUNITY): Payer: Self-pay

## 2022-11-05 ENCOUNTER — Encounter (HOSPITAL_COMMUNITY)
Admission: RE | Admit: 2022-11-05 | Discharge: 2022-11-05 | Disposition: A | Discharge status: Home / Self Care | Payer: Medicare Other | Source: Ambulatory Visit | Attending: Urology | Admitting: Urology

## 2022-11-05 DIAGNOSIS — N2889 Other specified disorders of kidney and ureter: Secondary | ICD-10-CM

## 2022-11-05 DIAGNOSIS — I10 Essential (primary) hypertension: Secondary | ICD-10-CM | POA: Insufficient documentation

## 2022-11-05 DIAGNOSIS — E1151 Type 2 diabetes mellitus with diabetic peripheral angiopathy without gangrene: Secondary | ICD-10-CM | POA: Insufficient documentation

## 2022-11-05 DIAGNOSIS — Z01818 Encounter for other preprocedural examination: Secondary | ICD-10-CM | POA: Insufficient documentation

## 2022-11-05 HISTORY — DX: Other complications of anesthesia, initial encounter: T88.59XA

## 2022-11-05 LAB — CBC WITH DIFFERENTIAL
Basophils Absolute: 0.1 10*3/uL (ref 0.0–0.2)
Basos: 1 %
EOS (ABSOLUTE): 0.4 10*3/uL (ref 0.0–0.4)
Eos: 5 %
Hematocrit: 44.7 % (ref 37.5–51.0)
Hemoglobin: 15.3 g/dL (ref 13.0–17.7)
Immature Grans (Abs): 0 10*3/uL (ref 0.0–0.1)
Immature Granulocytes: 0 %
Lymphocytes Absolute: 1.5 10*3/uL (ref 0.7–3.1)
Lymphs: 22 %
MCH: 32.5 pg (ref 26.6–33.0)
MCHC: 34.2 g/dL (ref 31.5–35.7)
MCV: 95 fL (ref 79–97)
Monocytes Absolute: 0.7 10*3/uL (ref 0.1–0.9)
Monocytes: 10 %
Neutrophils Absolute: 4.4 10*3/uL (ref 1.4–7.0)
Neutrophils: 62 %
RBC: 4.71 x10E6/uL (ref 4.14–5.80)
RDW: 13 % (ref 11.6–15.4)
WBC: 7.1 10*3/uL (ref 3.4–10.8)

## 2022-11-05 LAB — BASIC METABOLIC PANEL
Anion gap: 9 (ref 5–15)
BUN: 21 mg/dL (ref 8–23)
CO2: 22 mmol/L (ref 22–32)
Calcium: 8.9 mg/dL (ref 8.9–10.3)
Chloride: 104 mmol/L (ref 98–111)
Creatinine, Ser: 1.03 mg/dL (ref 0.61–1.24)
GFR, Estimated: >60 mL/min (ref >60)
Glucose, Bld: 225 mg/dL — ABNORMAL HIGH (ref 70–99)
Potassium: 4 mmol/L (ref 3.5–5.1)
Sodium: 135 mmol/L (ref 135–145)

## 2022-11-05 LAB — TYPE AND SCREEN
ABO/RH(D): A POS
Antibody Screen: NEGATIVE

## 2022-11-05 NOTE — Progress Notes (Signed)
Mr Zottola  here for PAT for his surgery on 11/08/2022.  Patient stated he got benadryl for itching after his last surgery and was 'knocked out for 3 days.  Dr Deatra Canter in to assess patient and answer questions about anesthesia.

## 2022-11-08 ENCOUNTER — Other Ambulatory Visit: Payer: Self-pay

## 2022-11-08 ENCOUNTER — Encounter (HOSPITAL_COMMUNITY): Payer: Self-pay | Admitting: Urology

## 2022-11-08 ENCOUNTER — Encounter (HOSPITAL_COMMUNITY)
Admission: RE | Disposition: A | Discharge status: Home / Self Care | Payer: Self-pay | Source: Home / Self Care | Attending: Urology

## 2022-11-08 ENCOUNTER — Inpatient Hospital Stay (HOSPITAL_COMMUNITY)
Admission: RE | Admit: 2022-11-08 | Discharge: 2022-11-12 | DRG: 657 | Disposition: A | Discharge status: Home Health | Payer: Medicare Other | Attending: Urology | Admitting: Urology

## 2022-11-08 ENCOUNTER — Inpatient Hospital Stay (HOSPITAL_COMMUNITY): Payer: Medicare Other | Admitting: Anesthesiology

## 2022-11-08 DIAGNOSIS — Z87891 Personal history of nicotine dependence: Secondary | ICD-10-CM | POA: Diagnosis not present

## 2022-11-08 DIAGNOSIS — C652 Malignant neoplasm of left renal pelvis: Secondary | ICD-10-CM | POA: Diagnosis not present

## 2022-11-08 DIAGNOSIS — F32A Depression, unspecified: Secondary | ICD-10-CM | POA: Diagnosis present

## 2022-11-08 DIAGNOSIS — I25119 Atherosclerotic heart disease of native coronary artery with unspecified angina pectoris: Secondary | ICD-10-CM

## 2022-11-08 DIAGNOSIS — Z823 Family history of stroke: Secondary | ICD-10-CM | POA: Diagnosis not present

## 2022-11-08 DIAGNOSIS — I252 Old myocardial infarction: Secondary | ICD-10-CM | POA: Diagnosis not present

## 2022-11-08 DIAGNOSIS — J449 Chronic obstructive pulmonary disease, unspecified: Secondary | ICD-10-CM

## 2022-11-08 DIAGNOSIS — C662 Malignant neoplasm of left ureter: Secondary | ICD-10-CM | POA: Diagnosis present

## 2022-11-08 DIAGNOSIS — I10 Essential (primary) hypertension: Secondary | ICD-10-CM | POA: Diagnosis present

## 2022-11-08 DIAGNOSIS — K660 Peritoneal adhesions (postprocedural) (postinfection): Secondary | ICD-10-CM | POA: Diagnosis present

## 2022-11-08 DIAGNOSIS — I255 Ischemic cardiomyopathy: Secondary | ICD-10-CM | POA: Diagnosis present

## 2022-11-08 DIAGNOSIS — N2889 Other specified disorders of kidney and ureter: Principal | ICD-10-CM

## 2022-11-08 DIAGNOSIS — Z833 Family history of diabetes mellitus: Secondary | ICD-10-CM | POA: Diagnosis not present

## 2022-11-08 DIAGNOSIS — C7902 Secondary malignant neoplasm of left kidney and renal pelvis: Secondary | ICD-10-CM | POA: Diagnosis present

## 2022-11-08 DIAGNOSIS — I2510 Atherosclerotic heart disease of native coronary artery without angina pectoris: Secondary | ICD-10-CM | POA: Diagnosis present

## 2022-11-08 DIAGNOSIS — C642 Malignant neoplasm of left kidney, except renal pelvis: Secondary | ICD-10-CM | POA: Diagnosis not present

## 2022-11-08 DIAGNOSIS — E119 Type 2 diabetes mellitus without complications: Secondary | ICD-10-CM | POA: Diagnosis present

## 2022-11-08 DIAGNOSIS — Z79890 Hormone replacement therapy (postmenopausal): Secondary | ICD-10-CM

## 2022-11-08 DIAGNOSIS — Z8249 Family history of ischemic heart disease and other diseases of the circulatory system: Secondary | ICD-10-CM

## 2022-11-08 DIAGNOSIS — Z955 Presence of coronary angioplasty implant and graft: Secondary | ICD-10-CM | POA: Diagnosis not present

## 2022-11-08 DIAGNOSIS — Z8673 Personal history of transient ischemic attack (TIA), and cerebral infarction without residual deficits: Secondary | ICD-10-CM

## 2022-11-08 DIAGNOSIS — Z8551 Personal history of malignant neoplasm of bladder: Secondary | ICD-10-CM

## 2022-11-08 DIAGNOSIS — Z79899 Other long term (current) drug therapy: Secondary | ICD-10-CM | POA: Diagnosis not present

## 2022-11-08 DIAGNOSIS — Z7984 Long term (current) use of oral hypoglycemic drugs: Secondary | ICD-10-CM

## 2022-11-08 DIAGNOSIS — R319 Hematuria, unspecified: Secondary | ICD-10-CM | POA: Diagnosis present

## 2022-11-08 DIAGNOSIS — Z885 Allergy status to narcotic agent status: Secondary | ICD-10-CM

## 2022-11-08 DIAGNOSIS — N50812 Left testicular pain: Secondary | ICD-10-CM | POA: Diagnosis present

## 2022-11-08 DIAGNOSIS — G4730 Sleep apnea, unspecified: Secondary | ICD-10-CM | POA: Diagnosis present

## 2022-11-08 DIAGNOSIS — E785 Hyperlipidemia, unspecified: Secondary | ICD-10-CM | POA: Diagnosis present

## 2022-11-08 DIAGNOSIS — Z883 Allergy status to other anti-infective agents status: Secondary | ICD-10-CM

## 2022-11-08 DIAGNOSIS — K219 Gastro-esophageal reflux disease without esophagitis: Secondary | ICD-10-CM | POA: Diagnosis present

## 2022-11-08 DIAGNOSIS — M1990 Unspecified osteoarthritis, unspecified site: Secondary | ICD-10-CM | POA: Diagnosis present

## 2022-11-08 DIAGNOSIS — Z888 Allergy status to other drugs, medicaments and biological substances status: Secondary | ICD-10-CM

## 2022-11-08 DIAGNOSIS — Z8043 Family history of malignant neoplasm of testis: Secondary | ICD-10-CM

## 2022-11-08 DIAGNOSIS — Z86010 Personal history of colonic polyps: Secondary | ICD-10-CM

## 2022-11-08 HISTORY — PX: CYSTOSCOPY: SHX5120

## 2022-11-08 HISTORY — PX: ROBOT ASSITED LAPAROSCOPIC NEPHROURETERECTOMY: SHX6077

## 2022-11-08 LAB — ABO/RH: ABO/RH(D): A POS

## 2022-11-08 LAB — CBC
HCT: 38.8 % — ABNORMAL LOW (ref 39.0–52.0)
Hemoglobin: 12.9 g/dL — ABNORMAL LOW (ref 13.0–17.0)
MCH: 32.1 pg (ref 26.0–34.0)
MCHC: 33.2 g/dL (ref 30.0–36.0)
MCV: 96.5 fL (ref 80.0–100.0)
Platelets: 178 10*3/uL (ref 150–400)
RBC: 4.02 MIL/uL — ABNORMAL LOW (ref 4.22–5.81)
RDW: 13.1 % (ref 11.5–15.5)
WBC: 15.8 10*3/uL — ABNORMAL HIGH (ref 4.0–10.5)
nRBC: 0 % (ref 0.0–0.2)

## 2022-11-08 LAB — PLATELET COUNT: Platelets: 122 10*3/uL — ABNORMAL LOW (ref 150–450)

## 2022-11-08 LAB — BASIC METABOLIC PANEL
Anion gap: 8 (ref 5–15)
BUN: 16 mg/dL (ref 8–23)
CO2: 22 mmol/L (ref 22–32)
Calcium: 8 mg/dL — ABNORMAL LOW (ref 8.9–10.3)
Chloride: 108 mmol/L (ref 98–111)
Creatinine, Ser: 1 mg/dL (ref 0.61–1.24)
GFR, Estimated: >60 mL/min (ref >60)
Glucose, Bld: 216 mg/dL — ABNORMAL HIGH (ref 70–99)
Potassium: 4.3 mmol/L (ref 3.5–5.1)
Sodium: 138 mmol/L (ref 135–145)

## 2022-11-08 LAB — GLUCOSE, CAPILLARY
Glucose-Capillary: 184 mg/dL — ABNORMAL HIGH (ref 70–99)
Glucose-Capillary: 214 mg/dL — ABNORMAL HIGH (ref 70–99)
Glucose-Capillary: 170 mg/dL — ABNORMAL HIGH (ref 70–99)

## 2022-11-08 LAB — SPECIMEN STATUS REPORT

## 2022-11-08 SURGERY — NEPHROURETERECTOMY, ROBOT-ASSISTED, LAPAROSCOPIC
Anesthesia: General | Site: Penis | Laterality: Left

## 2022-11-08 MED ORDER — TAMSULOSIN HCL 0.4 MG PO CAPS
0.4000 mg | ORAL_CAPSULE | Freq: Every day | ORAL | Status: DC
  Administered 2022-11-08 – 2022-11-12 (×5): 0.4 mg via ORAL
  Filled 2022-11-08 (×5): qty 1

## 2022-11-08 MED ORDER — OXYCODONE HCL 5 MG PO TABS
5.0000 mg | ORAL_TABLET | ORAL | Status: DC | PRN
  Administered 2022-11-08 – 2022-11-11 (×5): 5 mg via ORAL
  Filled 2022-11-08 (×5): qty 1

## 2022-11-08 MED ORDER — ACETAMINOPHEN 325 MG PO TABS
650.0000 mg | ORAL_TABLET | ORAL | Status: DC | PRN
  Administered 2022-11-09: 650 mg via ORAL
  Filled 2022-11-08: qty 2

## 2022-11-08 MED ORDER — EPHEDRINE 5 MG/ML INJ
INTRAVENOUS | Status: AC
  Filled 2022-11-08: qty 5

## 2022-11-08 MED ORDER — FENTANYL CITRATE (PF) 250 MCG/5ML IJ SOLN
INTRAMUSCULAR | Status: DC | PRN
  Administered 2022-11-08: 75 ug via INTRAVENOUS
  Administered 2022-11-08: 25 ug via INTRAVENOUS
  Administered 2022-11-08: 100 ug via INTRAVENOUS
  Administered 2022-11-08 (×3): 50 ug via INTRAVENOUS

## 2022-11-08 MED ORDER — BUPIVACAINE LIPOSOME 1.3 % IJ SUSP
INTRAMUSCULAR | Status: DC | PRN
  Administered 2022-11-08: 20 mL

## 2022-11-08 MED ORDER — FENTANYL CITRATE (PF) 250 MCG/5ML IJ SOLN
INTRAMUSCULAR | Status: AC
  Filled 2022-11-08: qty 5

## 2022-11-08 MED ORDER — SODIUM CHLORIDE (PF) 0.9 % IJ SOLN
INTRAMUSCULAR | Status: DC | PRN
  Administered 2022-11-08: 20 mL

## 2022-11-08 MED ORDER — STERILE WATER FOR IRRIGATION IR SOLN
Status: DC | PRN
  Administered 2022-11-08 (×2): 500 mL

## 2022-11-08 MED ORDER — LIDOCAINE HCL (PF) 2 % IJ SOLN
INTRAMUSCULAR | Status: AC
  Filled 2022-11-08: qty 5

## 2022-11-08 MED ORDER — ROCURONIUM BROMIDE 10 MG/ML (PF) SYRINGE
PREFILLED_SYRINGE | INTRAVENOUS | Status: DC | PRN
  Administered 2022-11-08: 10 mg via INTRAVENOUS
  Administered 2022-11-08: 20 mg via INTRAVENOUS
  Administered 2022-11-08: 60 mg via INTRAVENOUS
  Administered 2022-11-08 (×2): 10 mg via INTRAVENOUS

## 2022-11-08 MED ORDER — ROSUVASTATIN CALCIUM 20 MG PO TABS
40.0000 mg | ORAL_TABLET | Freq: Every day | ORAL | Status: DC
  Administered 2022-11-08 – 2022-11-12 (×5): 40 mg via ORAL
  Filled 2022-11-08 (×4): qty 2
  Filled 2022-11-08: qty 4

## 2022-11-08 MED ORDER — LACTATED RINGERS IV BOLUS
500.0000 mL | Freq: Once | INTRAVENOUS | Status: AC
  Administered 2022-11-08: 500 mL via INTRAVENOUS

## 2022-11-08 MED ORDER — ALBUMIN HUMAN 5 % IV SOLN
INTRAVENOUS | Status: AC
  Filled 2022-11-08: qty 250

## 2022-11-08 MED ORDER — SUGAMMADEX SODIUM 200 MG/2ML IV SOLN
INTRAVENOUS | Status: DC | PRN
  Administered 2022-11-08: 400 mg via INTRAVENOUS

## 2022-11-08 MED ORDER — SENNOSIDES-DOCUSATE SODIUM 8.6-50 MG PO TABS
2.0000 | ORAL_TABLET | Freq: Every day | ORAL | Status: DC
  Administered 2022-11-08 – 2022-11-11 (×4): 2 via ORAL
  Filled 2022-11-08 (×4): qty 2

## 2022-11-08 MED ORDER — INSULIN ASPART 100 UNIT/ML IJ SOLN
0.0000 [IU] | Freq: Three times a day (TID) | INTRAMUSCULAR | Status: DC
  Administered 2022-11-09 (×2): 3 [IU] via SUBCUTANEOUS
  Administered 2022-11-10 (×2): 2 [IU] via SUBCUTANEOUS
  Administered 2022-11-10 – 2022-11-12 (×5): 3 [IU] via SUBCUTANEOUS

## 2022-11-08 MED ORDER — HYDROMORPHONE HCL 1 MG/ML IJ SOLN
INTRAMUSCULAR | Status: AC
  Filled 2022-11-08: qty 0.5

## 2022-11-08 MED ORDER — VASOPRESSIN 20 UNIT/ML IV SOLN
INTRAVENOUS | Status: AC
  Filled 2022-11-08: qty 1

## 2022-11-08 MED ORDER — ROCURONIUM BROMIDE 10 MG/ML (PF) SYRINGE
PREFILLED_SYRINGE | INTRAVENOUS | Status: AC
  Filled 2022-11-08: qty 10

## 2022-11-08 MED ORDER — SODIUM CHLORIDE (PF) 0.9 % IJ SOLN
INTRAMUSCULAR | Status: AC
  Filled 2022-11-08: qty 20

## 2022-11-08 MED ORDER — EPHEDRINE SULFATE-NACL 50-0.9 MG/10ML-% IV SOSY
PREFILLED_SYRINGE | INTRAVENOUS | Status: DC | PRN
  Administered 2022-11-08: 10 mg via INTRAVENOUS
  Administered 2022-11-08: 20 mg via INTRAVENOUS

## 2022-11-08 MED ORDER — FENOFIBRATE 54 MG PO TABS
54.0000 mg | ORAL_TABLET | Freq: Every day | ORAL | Status: DC
  Administered 2022-11-08 – 2022-11-12 (×5): 54 mg via ORAL
  Filled 2022-11-08 (×8): qty 1

## 2022-11-08 MED ORDER — CEFAZOLIN SODIUM-DEXTROSE 2-4 GM/100ML-% IV SOLN
2.0000 g | INTRAVENOUS | Status: AC
  Administered 2022-11-08: 2 g via INTRAVENOUS
  Filled 2022-11-08: qty 100

## 2022-11-08 MED ORDER — PHENYLEPHRINE HCL (PRESSORS) 10 MG/ML IV SOLN
INTRAVENOUS | Status: DC | PRN
  Administered 2022-11-08: 80 ug via INTRAVENOUS
  Administered 2022-11-08: 160 ug via INTRAVENOUS
  Administered 2022-11-08: 80 ug via INTRAVENOUS

## 2022-11-08 MED ORDER — SODIUM CHLORIDE 0.9 % IV SOLN: INTRAVENOUS | Status: DC

## 2022-11-08 MED ORDER — ALBUMIN HUMAN 5 % IV SOLN: INTRAVENOUS | Status: DC | PRN

## 2022-11-08 MED ORDER — PROPOFOL 10 MG/ML IV BOLUS
INTRAVENOUS | Status: DC | PRN
  Administered 2022-11-08: 30 mg via INTRAVENOUS
  Administered 2022-11-08: 100 mg via INTRAVENOUS

## 2022-11-08 MED ORDER — LACTATED RINGERS IV SOLN: INTRAVENOUS | Status: DC | PRN

## 2022-11-08 MED ORDER — SODIUM CHLORIDE 0.9 % IR SOLN
Status: DC | PRN
  Administered 2022-11-08: 3000 mL

## 2022-11-08 MED ORDER — ASPIRIN 81 MG PO TBEC
81.0000 mg | DELAYED_RELEASE_TABLET | Freq: Every day | ORAL | Status: DC
  Administered 2022-11-08 – 2022-11-12 (×5): 81 mg via ORAL
  Filled 2022-11-08 (×5): qty 1

## 2022-11-08 MED ORDER — ONDANSETRON HCL 4 MG/2ML IJ SOLN
INTRAMUSCULAR | Status: AC
  Filled 2022-11-08: qty 2

## 2022-11-08 MED ORDER — VASOPRESSIN 20 UNIT/ML IV SOLN
INTRAVENOUS | Status: DC | PRN
  Administered 2022-11-08: 2 [IU] via INTRAVENOUS
  Administered 2022-11-08: 1 [IU] via INTRAVENOUS
  Administered 2022-11-08 (×3): .5 [IU] via INTRAVENOUS
  Administered 2022-11-08: 1 [IU] via INTRAVENOUS

## 2022-11-08 MED ORDER — BUPIVACAINE LIPOSOME 1.3 % IJ SUSP
INTRAMUSCULAR | Status: AC
  Filled 2022-11-08: qty 20

## 2022-11-08 MED ORDER — ONDANSETRON HCL 4 MG/2ML IJ SOLN
INTRAMUSCULAR | Status: DC | PRN
  Administered 2022-11-08: 4 mg via INTRAVENOUS

## 2022-11-08 MED ORDER — KETAMINE HCL 50 MG/5ML IJ SOSY
PREFILLED_SYRINGE | INTRAMUSCULAR | Status: AC
  Filled 2022-11-08: qty 5

## 2022-11-08 MED ORDER — RANOLAZINE ER 500 MG PO TB12
500.0000 mg | ORAL_TABLET | Freq: Two times a day (BID) | ORAL | Status: DC
  Administered 2022-11-08 – 2022-11-12 (×8): 500 mg via ORAL
  Filled 2022-11-08 (×8): qty 1

## 2022-11-08 MED ORDER — CHLORHEXIDINE GLUCONATE CLOTH 2 % EX PADS: 6.0000 | MEDICATED_PAD | Freq: Once | CUTANEOUS | Status: DC

## 2022-11-08 MED ORDER — OXYBUTYNIN CHLORIDE 5 MG PO TABS: 5.0000 mg | ORAL_TABLET | Freq: Three times a day (TID) | ORAL | Status: DC | PRN

## 2022-11-08 MED ORDER — ORAL CARE MOUTH RINSE: 15.0000 mL | Freq: Once | OROMUCOSAL | Status: AC

## 2022-11-08 MED ORDER — FENTANYL CITRATE (PF) 100 MCG/2ML IJ SOLN
INTRAMUSCULAR | Status: AC
  Filled 2022-11-08: qty 2

## 2022-11-08 MED ORDER — ONDANSETRON HCL 4 MG/2ML IJ SOLN: 4.0000 mg | INTRAMUSCULAR | Status: DC | PRN

## 2022-11-08 MED ORDER — PRIMIDONE 50 MG PO TABS
100.0000 mg | ORAL_TABLET | Freq: Four times a day (QID) | ORAL | Status: DC
  Administered 2022-11-08 – 2022-11-12 (×14): 100 mg via ORAL
  Filled 2022-11-08 (×14): qty 2

## 2022-11-08 MED ORDER — FUROSEMIDE 20 MG PO TABS
20.0000 mg | ORAL_TABLET | Freq: Every day | ORAL | Status: DC
  Administered 2022-11-08 – 2022-11-12 (×5): 20 mg via ORAL
  Filled 2022-11-08 (×5): qty 1

## 2022-11-08 MED ORDER — ISOSORBIDE MONONITRATE ER 30 MG PO TB24
15.0000 mg | ORAL_TABLET | Freq: Every evening | ORAL | Status: DC
  Administered 2022-11-08 – 2022-11-11 (×4): 15 mg via ORAL
  Filled 2022-11-08 (×4): qty 1

## 2022-11-08 MED ORDER — LACTATED RINGERS IV SOLN: INTRAVENOUS | Status: DC

## 2022-11-08 MED ORDER — VASOPRESSIN 20 UNITS/100 ML INFUSION FOR SHOCK
INTRAVENOUS | Status: DC | PRN
  Administered 2022-11-08: .02 [IU]/min via INTRAVENOUS

## 2022-11-08 MED ORDER — HYDROMORPHONE HCL 1 MG/ML IJ SOLN
0.5000 mg | INTRAMUSCULAR | Status: DC | PRN
  Administered 2022-11-08 – 2022-11-10 (×3): 1 mg via INTRAVENOUS
  Filled 2022-11-08 (×3): qty 1

## 2022-11-08 MED ORDER — ALBUTEROL SULFATE HFA 108 (90 BASE) MCG/ACT IN AERS
INHALATION_SPRAY | RESPIRATORY_TRACT | Status: DC | PRN
  Administered 2022-11-08: 2 via RESPIRATORY_TRACT

## 2022-11-08 MED ORDER — METOPROLOL SUCCINATE ER 50 MG PO TB24
25.0000 mg | ORAL_TABLET | Freq: Every day | ORAL | Status: DC
  Administered 2022-11-08 – 2022-11-12 (×5): 25 mg via ORAL
  Filled 2022-11-08 (×5): qty 1

## 2022-11-08 MED ORDER — ONDANSETRON HCL 4 MG/2ML IJ SOLN: 4.0000 mg | Freq: Once | INTRAMUSCULAR | Status: DC | PRN

## 2022-11-08 MED ORDER — HYDROMORPHONE HCL 1 MG/ML IJ SOLN
0.2500 mg | INTRAMUSCULAR | Status: DC | PRN
  Administered 2022-11-08 (×2): 0.5 mg via INTRAVENOUS
  Filled 2022-11-08: qty 0.5

## 2022-11-08 MED ORDER — PANTOPRAZOLE SODIUM 40 MG PO TBEC
40.0000 mg | DELAYED_RELEASE_TABLET | Freq: Two times a day (BID) | ORAL | Status: DC
  Administered 2022-11-08 – 2022-11-12 (×8): 40 mg via ORAL
  Filled 2022-11-08 (×8): qty 1

## 2022-11-08 MED ORDER — CHLORHEXIDINE GLUCONATE CLOTH 2 % EX PADS
6.0000 | MEDICATED_PAD | Freq: Every day | CUTANEOUS | Status: DC
  Administered 2022-11-08 – 2022-11-11 (×4): 6 via TOPICAL

## 2022-11-08 MED ORDER — CHLORHEXIDINE GLUCONATE 0.12 % MT SOLN
15.0000 mL | Freq: Once | OROMUCOSAL | Status: AC
  Administered 2022-11-08: 15 mL via OROMUCOSAL

## 2022-11-08 MED ORDER — PHENYLEPHRINE HCL-NACL 20-0.9 MG/250ML-% IV SOLN
INTRAVENOUS | Status: DC | PRN
  Administered 2022-11-08: 25 ug/min via INTRAVENOUS

## 2022-11-08 MED ORDER — PROPOFOL 10 MG/ML IV BOLUS
INTRAVENOUS | Status: AC
  Filled 2022-11-08: qty 20

## 2022-11-08 MED ORDER — LEVOTHYROXINE SODIUM 50 MCG PO TABS
50.0000 ug | ORAL_TABLET | Freq: Every day | ORAL | Status: DC
  Administered 2022-11-09 – 2022-11-12 (×4): 50 ug via ORAL
  Filled 2022-11-08 (×4): qty 1

## 2022-11-08 MED ORDER — INSULIN ASPART 100 UNIT/ML IJ SOLN: 0.0000 [IU] | Freq: Every day | INTRAMUSCULAR | Status: DC

## 2022-11-08 SURGICAL SUPPLY — 75 items
ADH SKN CLS APL DERMABOND .7 (GAUZE/BANDAGES/DRESSINGS) ×2
APL PRP STRL LF DISP 70% ISPRP (MISCELLANEOUS) ×2
BAG DRAIN URO TABLE W/ADPT NS (BAG) ×2 IMPLANT
BAG DRN 8 ADPR NS SKTRN CSTL (BAG) ×2
BAG HAMPER (MISCELLANEOUS) ×2 IMPLANT
BAG LAPAROSCOPIC 12 15 PORT 16 (BASKET) ×2 IMPLANT
BAG RETRIEVAL 12/15 (BASKET) ×2
CATH FOLEY 2WAY SLVR 30CC 20FR (CATHETERS) IMPLANT
CATH URETL OPEN END 6FR 70 (CATHETERS) IMPLANT
CHLORAPREP W/TINT 26 (MISCELLANEOUS) ×2 IMPLANT
CLIP LIGATING HEM O LOK PURPLE (MISCELLANEOUS) ×4 IMPLANT
CLOTH BEACON ORANGE TIMEOUT ST (SAFETY) ×2 IMPLANT
COVER MAYO STAND XLG (MISCELLANEOUS) IMPLANT
COVER SURGICAL LIGHT HANDLE (MISCELLANEOUS) ×4 IMPLANT
COVER TIP SHEARS 8 DVNC (MISCELLANEOUS) ×2 IMPLANT
DERMABOND ADVANCED .7 DNX12 (GAUZE/BANDAGES/DRESSINGS) ×4 IMPLANT
DRAIN CHANNEL 15F RND FF 3/16 (WOUND CARE) ×2 IMPLANT
DRAPE ARM DVNC X/XI (DISPOSABLE) ×8 IMPLANT
DRAPE COLUMN DVNC XI (DISPOSABLE) ×2 IMPLANT
DRAPE HALF SHEET 40X57 (DRAPES) IMPLANT
DRAPE INCISE IOBAN 66X45 STRL (DRAPES) ×2 IMPLANT
DRIVER NDL LRG 8 DVNC XI (INSTRUMENTS) ×2 IMPLANT
DRIVER NDLE LRG 8 DVNC XI (INSTRUMENTS) ×2 IMPLANT
DRSG TEGADERM 2-3/8X2-3/4 SM (GAUZE/BANDAGES/DRESSINGS) IMPLANT
DRSG TEGADERM 4X4.75 (GAUZE/BANDAGES/DRESSINGS) IMPLANT
ELECT LOOP 22F BIPOLAR SML (ELECTROSURGICAL) ×2
ELECT REM PT RETURN 15FT ADLT (MISCELLANEOUS) ×2 IMPLANT
ELECTRODE LOOP 22F BIPOLAR SML (ELECTROSURGICAL) IMPLANT
EVACUATOR SILICONE 100CC (DRAIN) ×2 IMPLANT
FORCEPS BPLR FENES DVNC XI (FORCEP) ×2 IMPLANT
FORCEPS PROGRASP DVNC XI (FORCEP) IMPLANT
GAUZE SPONGE 4X4 12PLY STRL (GAUZE/BANDAGES/DRESSINGS) IMPLANT
GLOVE BIO SURGEON STRL SZ8 (GLOVE) ×4 IMPLANT
GLOVE BIOGEL PI IND STRL 7.0 (GLOVE) ×4 IMPLANT
GLOVE BIOGEL PI IND STRL 8 (GLOVE) ×4 IMPLANT
GLOVE ECLIPSE 6.5 STRL STRAW (GLOVE) IMPLANT
GLOVE SURG SS PI 7.5 STRL IVOR (GLOVE) IMPLANT
GOWN STRL REUS W/TWL LRG LVL3 (GOWN DISPOSABLE) ×4 IMPLANT
GOWN STRL REUS W/TWL XL LVL3 (GOWN DISPOSABLE) ×4 IMPLANT
GUIDEWIRE STR ZIPWIRE 035X150 (MISCELLANEOUS) IMPLANT
IRRIG SUCT STRYKERFLOW 2 WTIP (MISCELLANEOUS) ×2
IRRIGATION SUCT STRKRFLW 2 WTP (MISCELLANEOUS) IMPLANT
KIT TURNOVER CYSTO (KITS) ×2 IMPLANT
MANIFOLD NEPTUNE II (INSTRUMENTS) ×2 IMPLANT
NDL HYPO 21X1.5 SAFETY (NEEDLE) ×2 IMPLANT
NDL INSUFFLATION 14GA 120MM (NEEDLE) ×2 IMPLANT
NEEDLE HYPO 21X1.5 SAFETY (NEEDLE) ×2 IMPLANT
NEEDLE INSUFFLATION 14GA 120MM (NEEDLE) ×2 IMPLANT
NS IRRIG 1000ML POUR BTL (IV SOLUTION) ×2 IMPLANT
PACK CYSTO (CUSTOM PROCEDURE TRAY) ×2 IMPLANT
PACK LAP CHOLE LZT030E (CUSTOM PROCEDURE TRAY) ×2 IMPLANT
PAD ARMBOARD 7.5X6 YLW CONV (MISCELLANEOUS) ×2 IMPLANT
PENCIL HANDSWITCHING (ELECTRODE) IMPLANT
RELOAD STAPLE 45 2.5 WHT DVNC (STAPLE) IMPLANT
RELOAD STAPLER 2.5X45 WHT DVNC (STAPLE) ×8 IMPLANT
SCISSORS MNPLR CVD DVNC XI (INSTRUMENTS) ×2 IMPLANT
SEAL CANN UNIV 5-8 DVNC XI (MISCELLANEOUS) ×8 IMPLANT
SET BASIN LINEN APH (SET/KITS/TRAYS/PACK) ×2 IMPLANT
SET TUBE SMOKE EVAC HIGH FLOW (TUBING) ×2 IMPLANT
SPONGE DRAIN TRACH 4X4 STRL 2S (GAUZE/BANDAGES/DRESSINGS) IMPLANT
STAPLER 45 SUREFORM DVNC (STAPLE) IMPLANT
STAPLER RELOAD 2.5X45 WHT DVNC (STAPLE) ×8
STAPLER VISISTAT (STAPLE) IMPLANT
STENT URET 6FRX26 CONTOUR (STENTS) IMPLANT
SUT ETHILON 3 0 PS 1 (SUTURE) ×2 IMPLANT
SUT MNCRL AB 4-0 PS2 18 (SUTURE) ×4 IMPLANT
SUT PDS AB CT VIOLET #0 27IN (SUTURE) IMPLANT
SUT VLOC BARB 180 ABS3/0GR12 (SUTURE) ×2
SUTURE VLOC BRB 180 ABS3/0GR12 (SUTURE) ×2 IMPLANT
SYR 20ML LL LF (SYRINGE) ×2 IMPLANT
TOWEL OR 17X26 4PK STRL BLUE (TOWEL DISPOSABLE) ×2 IMPLANT
TRAY FOLEY MTR SLVR 16FR STAT (SET/KITS/TRAYS/PACK) ×2 IMPLANT
TROCAR Z-THAD FIOS HNDL 12X100 (TROCAR) ×2 IMPLANT
WATER STERILE IRR 1000ML POUR (IV SOLUTION) ×2 IMPLANT
WATER STERILE IRR 500ML POUR (IV SOLUTION) ×2 IMPLANT

## 2022-11-08 NOTE — Anesthesia Procedure Notes (Signed)
Arterial Line Insertion Start/End6/17/2024 8:30 AM, 11/08/2022 8:33 AM Performed by: Molli Barrows, MD, Vanesha Athens, Lucille Passy, CRNA, CRNA  Patient location: OR. Preanesthetic checklist: patient identified, IV checked, site marked, risks and benefits discussed, surgical consent, monitors and equipment checked, pre-op evaluation, timeout performed and anesthesia consent radial was placed Catheter size: 20 G Hand hygiene performed , maximum sterile barriers used  and Seldinger technique used Allen's test indicative of satisfactory collateral circulation Attempts: 1 Procedure performed using ultrasound guided technique. Ultrasound Notes:anatomy identified, needle tip was noted to be adjacent to the nerve/plexus identified and no ultrasound evidence of intravascular and/or intraneural injection Following insertion, dressing applied. Post procedure assessment: normal and unchanged  Patient tolerated the procedure well with no immediate complications.

## 2022-11-08 NOTE — Op Note (Signed)
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  Left robot assisted laparoscopic radical nephroureterectomy 2.  Lysis of adhesions, moderate   Attending: Wilkie Aye, MD   Assistant: Franky Macho, MD   Anesthesia: General   Estimated blood loss: 150 cc   Drains: 20 French Foley catheter, JP drain   Specimens: Left radical nephroureterectomy and bladder cuff   Antibiotics: ancef   Findings: Multiple anterior wall adhesions. Tumor extension into surrounding fat and hilum.    Indications: Patient is a 72 year old with a history of high grade renal TCC.  Andrew [name]  After discussing treatment options the patient decided to proceed with left robot assisted laparoscopic radical nephroureterectomy   Procedure in detail: Prior to procedure consent was obtained. Patient was brought to the operating room and briefing was done sure correct patient, correct procedure, correct site.  General anesthesia was in administered patient was placed in the dorsal lithotomy position. His genitalia was prepped and draped in the usual sterile fashion. A cystoscope was passed into the urethra and to the bladder. No masses/lesions were noted int he bladder. A 6 french ureteral catheter was used to cannulate the left ureter. We then passed a zipwire through the ureteral catheter and up to the renal pelvis. We then placed a 6x26 JJ ureteral stent over the wire and up to the renal pelvis. The wire was removed a good coil was noted in the bladder. We then placed a 20 french foley. The patient was then repositioned into the right lateral decubitus position. A 16 French catheter was placed. their abdomen and flank was then prepped and draped usual sterile fashion.  A Veress needle was used to obtain pneumoperitoneum.  Once pneumoperitoneum was reestablished to 15 mmHg we then placed a 8 mm camera port lateral to the umbilicus at the latera; edge of rectus.  We then  proceeded to place 4 more robotic ports. We then placed 2 assistant ports. We then docked the robot.  We then started this dissection by removing a extensive amount of anterior abdominal wall adhesions.  We then dissected along the white line of Toldt.  We then reflected the colon medially.  We then identified the psoas muscle.  Once this was done we traced it down to the iliac vessels and identified the ureter.  Once we identified the gonadal vein and ureter were then traced this to the renal hilum.  We noted 2 renal arteries and 2 renal veins The renal veins and renal arteries were skeletonized.  We then placed hemolock clips on both renal arteries. Usng the robotic stapler we then ligated the renal arteries. We then used the other staple loads to ligate the renal veins.  Once the was complete we removed the kindey from it lateral and inferior attachments. Upon dissection the upper pole we noted direct tumor extension through the hilar fat through Gerotas fascia. We attempted to remove all visible tumor but we were unable safely remove tumor from the ligated hilar vessels. Once this was done we then freed the kidney from its lateral and posterior attachments. Once the kidney was freed we then proceeded to dissect the ureter further. We dissected it down to where it crossed the iliac vessels.  We excised the ureter with bladder cuff. The ureter was surrounded by dense fibrosis. We were unable to close the bladder defect due to significant scarring of the small bowel in the area.  We then used a Endo Catch  bag to remove the specimen.  Once the specimen was in the Endo Catch bag we then inspected the retroperitoneum and noted no residual bleeding. We then placed a JP drain in the left lower quadrant robot port. This was secured to the skin with a 2-0 nylon. We then removed our instruments, undocked the robot, and released the pneumoperitoneum.  We then made a left lower quadrant incision to remove the specimen.  Once  the specimen was removed we then closed the camera and assistant ports with 0 Vicryl in interrupted fashion.  We then closed the midline incision with looped PDS in a running fashion.  We then closed the overlying skin with 2-0 Vicryl in running fashion.  These skin was then closed with staples. The assistant was utilized for suction, retraction, specimen removal and closing the incisions.   This concluded the procedure which resulted by the patient.   Complications: None   Condition: Stable, x-rayed, transferred to PACU.   Plan: Patient is to be admitted for inpatient stay. They will be started on a clear liquid diet POD#1. His foley will remain in place for 2 weeks and he will have a cystogram prior to removal.

## 2022-11-08 NOTE — Transfer of Care (Signed)
Immediate Anesthesia Transfer of Care Note  Patient: Andrew [name]  Procedure(s) Performed: XI ROBOT ASSITED LAPAROSCOPIC NEPHROURETERECTOMY (Left: Flank) CYSTOSCOPY (Left: Penis)  Patient Location: PACU  Anesthesia Type:General  Level of Consciousness: awake, confused, and responds to stimulation  Airway & Oxygen Therapy: Patient Spontanous Breathing and Patient connected to face mask oxygen  Post-op Assessment: Report given to RN, Post -op Vital signs reviewed and stable, and Patient moving all extremities X 4  Post vital signs: Reviewed and stable  Last Vitals:  Vitals Value Taken Time  BP 128/80 11/08/22 1226  Temp 98   Pulse 135 11/08/22 1230  Resp 19 11/08/22 1230  SpO2 100 % 11/08/22 1230  Vitals shown include unvalidated device data.  Last Pain:  Vitals:   11/08/22 0653  PainSc: 0-No pain         Complications: No notable events documented.

## 2022-11-08 NOTE — Progress Notes (Signed)
Called patients daughter, Magda Paganini, informed her the patient was in his room on the 300 unit, room 331.  Answered the daughters concerns.  Daughter states she will probably wait a few hours before coming up.  Daughter informed patient resting comfortably.  Patient was drowsy before his procedure today.  Patient is calm, sleepy but arousal now.  Daughter was concerned that patient had received benadryl today and reassured her he did not.

## 2022-11-08 NOTE — Anesthesia Postprocedure Evaluation (Signed)
Anesthesia Post Note  Patient: Andrew [name]  Procedure(s) Performed: XI ROBOT ASSITED LAPAROSCOPIC NEPHROURETERECTOMY (Left: Flank) CYSTOSCOPY (Left: Penis)  Patient location during evaluation: PACU Anesthesia Type: General Level of consciousness: awake and alert, oriented, sedated and patient cooperative Pain management: pain level controlled Vital Signs Assessment: post-procedure vital signs reviewed and stable Respiratory status: spontaneous breathing, nonlabored ventilation, respiratory function stable and patient connected to nasal cannula oxygen Cardiovascular status: blood pressure returned to baseline and stable Postop Assessment: no apparent nausea or vomiting Anesthetic complications: no  No notable events documented.   Last Vitals:  Vitals:   11/08/22 1400 11/08/22 1415  BP: 102/66 (!) 111/57  Pulse: 96 97  Resp: 15 14  Temp:    SpO2: 94% 94%    Last Pain:  Vitals:   11/08/22 1400  PainSc: Asleep                 Erdem Naas C Doratha Mcswain

## 2022-11-08 NOTE — Anesthesia Preprocedure Evaluation (Addendum)
Anesthesia Evaluation  Patient identified by MRN, date of birth, ID band Patient awake    Reviewed: Allergy & Precautions, H&P , NPO status , Patient's Chart, lab work & pertinent test results, reviewed documented beta blocker date and time   History of Anesthesia Complications (+) PROLONGED EMERGENCE and history of anesthetic complications  Airway Mallampati: II  TM Distance: >3 FB Neck ROM: Full    Dental  (+) Missing, Dental Advisory Given   Pulmonary sleep apnea , COPD, Patient abstained from smoking., former smoker   Pulmonary exam normal breath sounds clear to auscultation       Cardiovascular Exercise Tolerance: Good hypertension (did not take metoprolol today), Pt. on medications and Pt. on home beta blockers + angina  + CAD, + Past MI and + Cardiac Stents  Normal cardiovascular exam Rhythm:Regular Rate:Normal   1. The left ventricle has mildly reduced systolic function, with an  ejection fraction of 45-50%. The cavity size was normal. Left ventricular  diastolic Doppler parameters are consistent with impaired relaxation.   2. Severe hypokinesis of the anterolateral and inferolateral myocardium.   3. The right ventricle has normal systolc function. The cavity was  normal. There is no increase in right ventricular wall thickness. Right  ventricular systolic pressure could not be assessed.   4. Small pericardial effusion.   5. The pericardial effusion is anterior to the right ventricle.   6. Small to moderate pericardial effusion measuring up to 1.2 cm. There  is no collapse of the RA/RV and no significant respiratory flow  variability. Findings are not consistent with tamponade.   7. Moderate thickening of the aortic valve Moderate calcification of the  aortic valve. Aortic valve regurgitation was not assessed by color flow  Doppler.   8. Pulmonic valve regurgitation was not assessed by color flow Doppler.   9. The  inferior vena cava was dilated in size with <50% respiratory  variability.     Neuro/Psych  PSYCHIATRIC DISORDERS  Depression     Neuromuscular disease CVA    GI/Hepatic Neg liver ROS,GERD  Medicated and Controlled,,  Endo/Other  diabetes, Well Controlled, Type 2, Oral Hypoglycemic Agents, Insulin DependentHypothyroidism    Renal/GU Renal disease (left renal carcinoma) Bladder dysfunction (bladder cancer)      Musculoskeletal  (+) Arthritis , Osteoarthritis,    Abdominal   Peds negative pediatric ROS (+)  Hematology  (+) Blood dyscrasia, anemia   Anesthesia Other Findings HPI: Andrew [name] is a 72 year old gentleman with history of COPD, diabetes mellitus, GERD, hypertension, myocardial infarction x 2 with heart artery stent, stroke, sleep apnea (unable to tolerate CPAP).  He was previously followed at [facility] cardiology. He has coronary artery disease and suffered a STEMI in April 2020. No significant obvious culprit lesion was found. He was found to have CTO of distal LAD and mid left circumflex.  He had a prior history of PCI of the ramus intermedius in the past. He underwent PTCA of right PDA in April 2020. STEMI was complicated by myopericarditis treated with colchicine for 3 months. Also received prednisone. Echocardiogram reportedly revealed a small to moderate pericardial effusion. Indicates that he had quite a bit of discomfort associated with his pericardial effusion but this is subsequently resolved.  He has a history of ischemic cardiomyopathy with ejection fraction 45 to 50%. He has a history of hypertension, history of dyslipidemia on Crestor, history diabetes mellitus type 2, history of peripheral vascular disease manifested by a stroke. He is a  long-term smoker and continues to smoke cigarettes. He states he did quit for 3 months after his myocardial infarction in 2020 but relapsed and has been unable to quit since that time. He states he has tried  adjunctive measures through the years including patches and oral medications without benefit.  In November 2023 he presented to the office with unstable angina and was referred to the hospital. He underwent cardiac catheterization. Coronary anatomy notable for left main with no obstructive stenosis. LAD had 40% proximal stenosis and 99% mid vessel stenosis the distal LAD was notable for 70 to 75% stenosis. Circumflex had 75% stenosis in the mid vessel, sequential 99% stenosis then 100% occlusion which was noted to be a chronic total occlusion. Right coronary artery had mid segment 50% stenosis but otherwise free of obstructive stenosis. Distal vessels were small. PDA and PLB were small vessels with severe stenoses.  The patient followed with Dr. Ivan Croft two months ago and was doing well. Reported some occasional chest wall twinges but no pressure. He reported compliance with medications including aspirin, Plavix, metoprolol, ranolazine, isosorbide, Crestor. He was wearing LifeVest and denied any discharges. Andrew [name] is on his medication list but he says he stopped taking it a few months ago.  Follow-up echo 06/15/22 revealed improved LVEF of 45-50%.  He returns today for follow-up and he is accompanied today by his daughter. Overall he is doing well. He is not having any chest pain or dyspnea. Denies orthopnea, PND or syncope. No edema. Weights are stable. Blood pressure is excellent. He reports some issues with his balance/gait for several months now. He walks with a walker. Reports weakness with his legs. He would like to participate in physical therapy.  Of note, the patient stopped wearing LifeVest several weeks ago due to rash around pads.   Patient's Medications    Accurate as of August 09, 2022 2:09 PM. Reflects encounter med changes as of last refresh     Reproductive/Obstetrics negative OB ROS                             Anesthesia Physical Anesthesia  Plan  ASA: 3  Anesthesia Plan: General   Post-op Pain Management: Dilaudid IV   Induction: Intravenous  PONV Risk Score and Plan: 4 or greater and Ondansetron and Dexamethasone  Airway Management Planned: Oral ETT  Additional Equipment: Arterial line  Intra-op Plan:   Post-operative Plan: Extubation in OR  Informed Consent: I have reviewed the patients History and Physical, chart, labs and discussed the procedure including the risks, benefits and alternatives for the proposed anesthesia with the patient or authorized representative who has indicated his/her understanding and acceptance.    Discussed DNR with patient, Discussed DNR with power of attorney and Continue DNR.   Dental advisory given  Plan Discussed with: CRNA and Surgeon  Anesthesia Plan Comments:         Anesthesia Quick Evaluation

## 2022-11-08 NOTE — H&P (Signed)
HPI: [name] is a 71yo here for left robotic nephroureterectomy. He removed his stent POD#3. Pathology is high grade TCC. He has mild left flank pain and mild left testicular pain. His hematuria resolved yesterday.      PMH:     Past Medical History:  Diagnosis Date   Acute viral pericarditis 09/03/2018    Inferior STE - but Negative Troponin.  CP &SOB.  Felt to be Viral.   Anemia     Arthritis     Bladder cancer 2014   CAD (coronary artery disease)     Cataract      bilateral   Depression     Diabetes mellitus without complication     Essential hypertension 01/20/2018    in the past no longer on medication   GERD (gastroesophageal reflux disease)     Hx of adenomatous colonic polyps 09/2017    colonoscopy   Hyperlipidemia with target LDL less than 70 12/19/2015   Intraoperative floppy iris syndrome (IFIS) 07/2019   Multivessel CAD - CTO dLAD, mCx. DES PCI RI, PTCA of RPDA 01/21/2018    12/2017 - Cath for ? STEMI -> distal/apical LAD & m-dCx CTO (unable to cross Cx).  Mod rPDA. Severe RI - DES PCI.  08/2018 - Cath for ? Inf STEMI - RI stent patent & CTO dLAD/mCx. Progression of rPDA 95% -> PTCA only.  Thought to be Pericarditis & not MI (troponin negative).    Neuromuscular disorder     Sleep apnea      BiPAP not currently being used   Status post tendon repair 1989   STEMI (ST elevation myocardial infarction) 01/20/2018    Cardiac cath January 21, 2018: Severe multivessel disease with unclear lesion, but opted for PCI/DES x1 to the RI.  Appeared to have CTO of the distal LAD and circumflex as well as moderate mid LAD and diagonal disease as well as PDA.Marland Kitchen  Unable to cross circumflex lesion.   STEMI (ST elevation myocardial infarction) 03/2022   Stroke 11/2015    At times pt has dizziness with loss of vision   Tremors of nervous system 2011      Surgical History:      Past Surgical History:  Procedure Laterality Date   APPENDECTOMY       BLADDER SURGERY       COLONOSCOPY        CORONARY STENT INTERVENTION N/A 01/21/2018    Procedure: CORONARY STENT INTERVENTION;  Surgeon: Marykay Lex, MD;  Location[facility] INVASIVE CV LAB;  Service: Cardiovascular;  Laterality: N/A;  95% Ramus Intermedius -PCI with synergy DES 2.25 mm x 12 mm postdilated 2.4 mm.   CORONARY/GRAFT ACUTE MI REVASCULARIZATION N/A 01/21/2018    Procedure: Coronary/Graft Acute MI Revascularization;  Surgeon: Marykay Lex, MD;  Location: [facility] INVASIVE CV LAB;  Service: Cardiovascular;  Laterality: N/A;  attempted revas to distal CFX;    CORONARY/GRAFT ACUTE MI REVASCULARIZATION N/A 09/04/2018    Procedure: Coronary/Graft Acute MI Revascularization;  Surgeon: Tonny Bollman, MD;  Location: [facility] INVASIVE CV LAB:: PTCA/POBA of rPDA (2.0 mm balloon)   CYSTOSCOPY N/A 01/21/2020    Procedure: CYSTOSCOPY;  Surgeon: Malen Gauze, MD;  Location: [facility];  Service: Urology;  Laterality: N/A;   CYSTOSCOPY N/A 02/09/2021    Procedure: CYSTOSCOPY;  Surgeon: Malen Gauze, MD;  Location: [facility];  Service: Urology;  Laterality: N/A;   CYSTOSCOPY W/ RETROGRADES Bilateral 08/22/2015    Procedure: CYSTOSCOPY WITH RETROGRADE PYELOGRAM;  Surgeon: Jonny Ruiz  Annabell Howells, MD;  Location: [facility];  Service: Urology;  Laterality: Bilateral;   CYSTOSCOPY W/ RETROGRADES Bilateral 01/16/2016    Procedure: CYSTOSCOPY WITH RETROGRADE PYELOGRAM;  Surgeon: Bjorn Pippin, MD;  Location: [facility];  Service: Urology;  Laterality: Bilateral;   CYSTOSCOPY W/ RETROGRADES Bilateral 01/07/2017    Procedure: CYSTOSCOPY WITH RETROGRADE PYELOGRAM;  Surgeon: Bjorn Pippin, MD;  Location: [facility];  Service: Urology;  Laterality: Bilateral;   CYSTOSCOPY WITH BIOPSY N/A 08/22/2015    Procedure: CYSTOSCOPY WITH BLADDER BIOPSY;  Surgeon: Bjorn Pippin, MD;  Location: [facility];  Service: Urology;  Laterality: N/A;   CYSTOSCOPY WITH BIOPSY N/A 01/16/2016    Procedure: CYSTOSCOPY WITH BLADDER BIOPSY;  Surgeon: Bjorn Pippin, MD;  Location: [facility];  Service: Urology;  Laterality:  N/A;   CYSTOSCOPY WITH FULGERATION N/A 01/16/2016    Procedure: CYSTOSCOPY WITH FULGERATION;  Surgeon: Bjorn Pippin, MD;  Location: [facility];  Service: Urology;  Laterality: N/A;   CYSTOSCOPY/RETROGRADE/URETEROSCOPY Left 09/06/2022    Procedure: CYSTOSCOPY/RETROGRADE/URETEROSCOPY/STENT PLACEMENT;  Surgeon: Malen Gauze, MD;  Location: [facility];  Service: Urology;  Laterality: Left;   KNEE ARTHROSCOPY Right 1989   LEFT HEART CATH AND CORONARY ANGIOGRAPHY N/A 01/21/2018    Procedure: LEFT HEART CATH AND CORONARY ANGIOGRAPHY;  Surgeon: Marykay Lex, MD;  Location: [facility] INVASIVE CV LAB;  Service: Cardiovascular;; 95% RI-DES PCI.  80% o-pCX (PTCA) -> dCX 80%-100% CTO (unsuccessful PTCA unable to cross distal lesion with balloon.)  100% CTO dLAD. RPDA 80% and 70% as well as P AV 180%, PA V2 60%.  (too small for PCI)   LEFT HEART CATH AND CORONARY ANGIOGRAPHY N/A 09/04/2018    Procedure: LEFT HEART CATH AND CORONARY ANGIOGRAPHY;  Surgeon: Tonny Bollman, MD;  Location: [facility] INVASIVE CV LAB: (presumed Inferior STEMI) = progression of small rPDA -95% (PTCA only). Patent RI stent. CTO of apical LAD & mCx.   ROTATOR CUFF REPAIR Bilateral 2000   rt. leg fracture surgery       TRANSTHORACIC ECHOCARDIOGRAM   01/21/2018    Mild LVH.  EF 50 and 55%.  Apical inferior hypokinesis.  Mid-apical anterolateral hypokinesis.  Normal diastolic function for age    TRANSTHORACIC ECHOCARDIOGRAM   09/04/2018    -? Inf STEMI vs. Pericarditis:  Mildly reduced EF of 45 to 50%.  Impaired relaxation-GR 1 DD.  Severe hypokinesis of the anterolateral and inferolateral wall.  Small-moderate anterior pericardial effusion.  Moderate aortic sclerosis but no stenosis.   TRANSURETHRAL RESECTION OF BLADDER TUMOR N/A 01/07/2017    Procedure: TRANSURETHRAL RESECTION OF BLADDER TUMOR (TURBT);  Surgeon: Bjorn Pippin, MD;  Location: [facility];  Service: Urology;  Laterality: N/A;   TRANSURETHRAL RESECTION OF BLADDER TUMOR N/A 01/21/2020    Procedure:  TRANSURETHRAL RESECTION OF BLADDER TUMOR (TURBT);  Surgeon: Malen Gauze, MD;  Location: [facility];  Service: Urology;  Laterality: N/A;   TRANSURETHRAL RESECTION OF BLADDER TUMOR N/A 02/09/2021    Procedure: TRANSURETHRAL RESECTION OF BLADDER TUMOR (TURBT);  Surgeon: Malen Gauze, MD;  Location: [facility];  Service: Urology;  Laterality: N/A;   URETERAL BIOPSY Left 09/06/2022    Procedure: URETERAL BIOPSY/fulgeration;  Surgeon: Malen Gauze, MD;  Location: [facility];  Service: Urology;  Laterality: Left;   WISDOM TOOTH EXTRACTION          Home Medications:  Allergies as of 09/13/2022         Reactions    Tramadol Shortness Of Breath    And dizziness    Trulicity [  dulaglutide] Swelling    Ankles and feet swell            Medication List           Accurate as of September 13, 2022  9:43 AM. If you have any questions, ask your nurse or doctor.              aspirin EC 81 MG tablet Take 81 mg by mouth daily. Swallow whole.    clopidogrel 75 MG tablet Commonly known as: PLAVIX Take 75 mg by mouth daily.    empagliflozin 25 MG Tabs tablet Commonly known as: JARDIANCE Take 25 mg by mouth daily.    fenofibrate 48 MG tablet Commonly known as: Tricor Take 1 tablet (48 mg total) by mouth daily.    Fiber Choice Fruity Bites 1.5 g Chew Generic drug: Inulin Take daily as directed    furosemide 40 MG tablet Commonly known as: LASIX Take 20 mg by mouth daily.    glimepiride 2 MG tablet Commonly known as: AMARYL Take 2 mg by mouth daily before breakfast.    isosorbide mononitrate 30 MG 24 hr tablet Commonly known as: IMDUR Take 1 tablet (30 mg total) by mouth daily. What changed:  how much to take when to take this    levothyroxine 50 MCG tablet Commonly known as: SYNTHROID Take 50 mcg by mouth daily before breakfast.    LUBRICATING EYE DROPS OP Place 1 drop into both eyes daily as needed (dry eyes).    metFORMIN 1000 MG tablet Commonly known as:  GLUCOPHAGE Take 1 tablet by mouth 2 times daily with a meal. What changed:  how much to take how to take this when to take this additional instructions    metoprolol succinate 25 MG 24 hr tablet Commonly known as: TOPROL-XL Take 25 mg by mouth daily.    nitroGLYCERIN 0.4 MG SL tablet Commonly known as: NITROSTAT PLACE 1 TABLET UNDER THE TONGUE AT ONSET OF CHEST PAIN EVERY 5 MINTUES UP TO 3 TIMES AS NEEDED    OneTouch Verio test strip Generic drug: glucose blood daily.    oxyCODONE-acetaminophen 10-325 MG tablet Commonly known as: PERCOCET Take 2 tablets by mouth daily as needed for pain.    oxyCODONE-acetaminophen 5-325 MG tablet Commonly known as: Percocet Take 1 tablet by mouth every 4 (four) hours as needed for severe pain.    pantoprazole 40 MG tablet Commonly known as: PROTONIX Take 1 tablet (40 mg total) by mouth 2 (two) times daily. Please schedule an office visit for further refills. Thank you    PreserVision AREDS 2 Caps Take 1 capsule by mouth 2 (two) times daily.    primidone 50 MG tablet Commonly known as: MYSOLINE Take 1 tablet (50 mg total) by mouth 4 (four) times daily. What changed: how much to take    ranolazine 500 MG 12 hr tablet Commonly known as: RANEXA Take 500 mg by mouth 2 (two) times daily.    rosuvastatin 40 MG tablet Commonly known as: CRESTOR Take 1 tablet (40 mg total) by mouth daily.    tamsulosin 0.4 MG Caps capsule Commonly known as: FLOMAX Take 1 capsule (0.4 mg total) by mouth daily.    Evaristo Bury FlexTouch 100 UNIT/ML FlexTouch Pen Generic drug: insulin degludec Inject 14 Units into the skin daily.    Vitamin D 50 MCG (2000 UT) tablet Take 2,000 Units by mouth 2 (two) times daily.    Voltaren 1 % Gel Generic drug: diclofenac Sodium Apply 1 Application  topically 4 (four) times daily as needed (pain).             Allergies:       Allergies  Allergen Reactions   Tramadol Shortness Of Breath      And dizziness    Trulicity [Dulaglutide] Swelling      Ankles and feet swell      Family History:      Family History  Problem Relation Age of Onset   Diabetes Mother 73        type 1   Stroke Mother     Dementia Father     Diabetes Brother     Heart attack Brother 69   Testicular cancer Brother     Diabetes Brother     Cancer Brother          testicular   Colon cancer Neg Hx     Colon polyps Neg Hx     Esophageal cancer Neg Hx     Rectal cancer Neg Hx     Stomach cancer Neg Hx     Pancreatic cancer Neg Hx        Social History:  reports that he has quit smoking. His smoking use included cigarettes. He has a 50.00 pack-year smoking history. He has quit using smokeless tobacco. He reports that he does not drink alcohol and does not use drugs.   ROS: All other review of systems were reviewed and are negative except what is noted above in HPI   Physical Exam: BP 110/73   Pulse 89   Ht 6\' 4"  (1.93 m)   Wt 185 lb (83.9 kg)   BMI 22.52 kg/m   Constitutional:  Alert and oriented, No acute distress. HEENT: [place] AT, moist mucus membranes.  Trachea midline, no masses. Cardiovascular: No clubbing, cyanosis, or edema. Respiratory: Normal respiratory effort, no increased work of breathing. GI: Abdomen is soft, nontender, nondistended, no abdominal masses GU: No CVA tenderness.  Lymph: No cervical or inguinal lymphadenopathy. Skin: No rashes, bruises or suspicious lesions. Neurologic: Grossly intact, no focal deficits, moving all 4 extremities. Psychiatric: Normal mood and affect.   Laboratory Data: Recent Labs       Lab Results  Component Value Date    WBC 8.9 09/01/2022    HGB 15.5 09/01/2022    HCT 46.5 09/01/2022    MCV 95.9 09/01/2022    PLT 212 09/01/2022        Recent Labs       Lab Results  Component Value Date    CREATININE 0.98 09/01/2022        Recent Labs  No results found for: "PSA"     Recent Labs  No results found for: "TESTOSTERONE"     Recent Labs        Lab Results  Component Value Date    HGBA1C 8.1 (H) 09/01/2022        Urinalysis Labs (Brief)          Component Value Date/Time    COLORURINE YELLOW 09/03/2018 0000    APPEARANCEUR Cloudy (A) 05/12/2022 1407    LABSPEC 1.028 09/03/2018 0000    PHURINE 5.0 09/03/2018 0000    GLUCOSEU 3+ (A) 05/12/2022 1407    HGBUR SMALL (A) 09/03/2018 0000    BILIRUBINUR Negative 05/12/2022 1407    KETONESUR NEGATIVE 09/03/2018 0000    PROTEINUR 1+ (A) 05/12/2022 1407    PROTEINUR NEGATIVE 09/03/2018 0000    UROBILINOGEN 0.2 10/15/2019 1606    NITRITE Negative  05/12/2022 1407    NITRITE NEGATIVE 09/03/2018 0000    LEUKOCYTESUR Negative 05/12/2022 1407    LEUKOCYTESUR NEGATIVE 09/03/2018 0000        Recent Labs       Lab Results  Component Value Date    LABMICR See below: 05/12/2022    WBCUA 0-5 05/12/2022    RBCUA None seen 01/15/2016    LABEPIT 0-10 05/12/2022    MUCUS Present 11/16/2021    BACTERIA None seen 05/12/2022        Pertinent Imaging:   No results found for this or any previous visit.   No results found for this or any previous visit.   No results found for this or any previous visit.   No results found for this or any previous visit.   Results for orders placed during the hospital encounter of 06/21/22   Ultrasound renal complete   Narrative CLINICAL DATA:  left hydronephrosis   EXAM: RENAL / URINARY TRACT ULTRASOUND COMPLETE   COMPARISON:  September 04, 2018   FINDINGS: Right Kidney:   Renal measurements: 13.1 x 4.5 x 7.6 cm = volume: 232 mL. Echogenicity within normal limits. No mass or hydronephrosis visualized.   Left Kidney:   Assessment is limited secondary to overlapping bowel gas and limited acoustic windows. Renal measurements: 7.3 x 6.3 x 5.5 cm = volume: 140 mL. Echogenicity within normal limits. No mass visualized. There is mild LEFT-sided pelviectasis without frank hydronephrosis.   Bladder:   Appears normal for degree of bladder  distention.   Other:   None.   IMPRESSION: There is mild LEFT-sided pelviectasis without frank hydronephrosis.   If concern for obstructive physiology, recommend dedicated CT scan abdomen pelvis with and without contrast.     Electronically Signed By: Meda Klinefelter M.D. On: 06/21/2022 16:11   No valid procedures specified. No results found for this or any previous visit.   Results for orders placed during the hospital encounter of 01/14/17   CT RENAL STONE STUDY   Narrative CLINICAL DATA:  Sharp back pain and hematuria. Recent bladder tumor removed 1 week ago.   EXAM: CT ABDOMEN AND PELVIS WITHOUT CONTRAST   TECHNIQUE: Multidetector CT imaging of the abdomen and pelvis was performed following the standard protocol without IV contrast.   COMPARISON:  None.   FINDINGS: Lower chest: No acute abnormality. Right coronary artery atherosclerotic vascular calcifications.   Hepatobiliary: Hepatic steatosis. Scattered punctate granulomas throughout the liver, consistent with prior granulomatous disease. The gallbladder is decompressed. No biliary dilatation.   Pancreas: Unremarkable. No pancreatic ductal dilatation or surrounding inflammatory changes.   Spleen: Normal size without focal abnormality. Multiple punctate calcifications within the spleen, consistent with prior granulomatous disease.   Adrenals/Urinary Tract: The adrenal glands are unremarkable. No renal or ureteral calculi. Minimal left greater than right perinephric fat stranding, nonspecific. No hydronephrosis. The bladder is unremarkable.   Stomach/Bowel: Stomach is within normal limits. Appendix is surgically absent. No evidence of bowel wall thickening, distention, or inflammatory changes. Scattered left-sided colonic diverticulosis.   Vascular/Lymphatic: Aortic atherosclerosis. No enlarged abdominal or pelvic lymph nodes.   Reproductive: Prostate is unremarkable.   Other: No abdominal  wall hernia or abnormality. No abdominopelvic ascites.   Musculoskeletal: No acute osseous abnormality. Degenerative changes of the thoracolumbar spine with severe lower lumbar facet arthropathy. 6 mm anterolisthesis of L4 on L5.   IMPRESSION: 1. No evidence of acute intra-abdominal process. No explanation for the patient's symptoms. 2. No renal or ureteral calculi.  No  hydronephrosis. 3. Hepatic steatosis. 4.  Aortic atherosclerosis (ICD10-I70.0).     Electronically Signed By: Obie Dredge M.D. On: 01/14/2017 08:52     Assessment & Plan:     1. Malignant neoplasm of left renal pelvis -We discussed the management of left renal TCC including surveillance, endoscopic resection, and radical nephroureterectomy. After discussing the options the patient elects for left radical nephroureterectomy. Risks/benefits/alternatives discussed

## 2022-11-08 NOTE — Telephone Encounter (Signed)
Verbal from Dr. Ronne Binning that he does not think b12 is causing his platelets to drop.  Patient aware of MD response and will follow up as scheduled for his surgery.

## 2022-11-09 LAB — CBC
HCT: 35.9 % — ABNORMAL LOW (ref 39.0–52.0)
Hemoglobin: 11.9 g/dL — ABNORMAL LOW (ref 13.0–17.0)
MCH: 32.5 pg (ref 26.0–34.0)
MCHC: 33.1 g/dL (ref 30.0–36.0)
MCV: 98.1 fL (ref 80.0–100.0)
Platelets: 166 10*3/uL (ref 150–400)
RBC: 3.66 MIL/uL — ABNORMAL LOW (ref 4.22–5.81)
RDW: 13.2 % (ref 11.5–15.5)
WBC: 8.5 10*3/uL (ref 4.0–10.5)
nRBC: 0 % (ref 0.0–0.2)

## 2022-11-09 LAB — BASIC METABOLIC PANEL
Anion gap: 9 (ref 5–15)
BUN: 17 mg/dL (ref 8–23)
CO2: 24 mmol/L (ref 22–32)
Calcium: 8.2 mg/dL — ABNORMAL LOW (ref 8.9–10.3)
Chloride: 104 mmol/L (ref 98–111)
Creatinine, Ser: 1.1 mg/dL (ref 0.61–1.24)
GFR, Estimated: >60 mL/min (ref >60)
Glucose, Bld: 178 mg/dL — ABNORMAL HIGH (ref 70–99)
Potassium: 4 mmol/L (ref 3.5–5.1)
Sodium: 137 mmol/L (ref 135–145)

## 2022-11-09 LAB — GLUCOSE, CAPILLARY
Glucose-Capillary: 181 mg/dL — ABNORMAL HIGH (ref 70–99)
Glucose-Capillary: 181 mg/dL — ABNORMAL HIGH (ref 70–99)
Glucose-Capillary: 135 mg/dL — ABNORMAL HIGH (ref 70–99)
Glucose-Capillary: 164 mg/dL — ABNORMAL HIGH (ref 70–99)

## 2022-11-09 NOTE — TOC CM/SW Note (Signed)
Transition of Care [facility]) - Inpatient Brief Assessment   Patient Details  Name: Andrew [name] MRN: [id number] Date of Birth: 1951/04/14  Transition of Care [facility]) CM/SW Contact:    Villa Herb, LCSWA Phone Number: 11/09/2022, 9:36 AM   Clinical Narrative: Transition of Care Department [facility]) has reviewed patient and no TOC needs have been identified at this time. We will continue to monitor patient advancement through interdisciplinary progression rounds. If new patient transition needs arise, please place a TOC consult.  Transition of Care Asessment: Insurance and Status: Insurance coverage has been reviewed Patient has primary care physician: Yes Home environment has been reviewed: from home Prior level of function:: independent Prior/Current Home Services: No current home services Social Determinants of Health Reivew: SDOH reviewed no interventions necessary Readmission risk has been reviewed: Yes Transition of care needs: no transition of care needs at this time

## 2022-11-09 NOTE — Progress Notes (Signed)
1 Day Post-Op Subjective: Patient reports moderate incisional pain. Patient has ambulated to chair. Hemoglobin 11.9. tolerating clears  Objective: Vital signs in last 24 hours: Temp:  [98.7 F (37.1 C)-98.9 F (37.2 C)] 98.9 F (37.2 C) (06/18 0329) Pulse Rate:  [86-94] 94 (06/18 1407) Resp:  [16-20] 16 (06/18 1407) BP: (112-124)/(62-63) 124/63 (06/18 1407) SpO2:  [93 %-96 %] 96 % (06/18 1407)  Intake/Output from previous day: 06/17 0701 - 06/18 0700 In: 3074 [P.O.:240; I.V.:2484; IV Piggyback:350] Out: 2150 [Urine:1625; Drains:125; Blood:400] Intake/Output this shift: No intake/output data recorded.  Physical Exam:  General:alert, cooperative, and appears stated age GI: soft, non tender, normal bowel sounds, no palpable masses, no organomegaly, no inguinal hernia Male genitalia: not done Extremities: extremities normal, atraumatic, no cyanosis or edema  Lab Results: Recent Labs    11/08/22 1356 11/09/22 0449  HGB 12.9* 11.9*  HCT 38.8* 35.9*   BMET Recent Labs    11/08/22 1356 11/09/22 0449  NA 138 137  K 4.3 4.0  CL 108 104  CO2 22 24  GLUCOSE 216* 178*  BUN 16 17  CREATININE 1.00 1.10  CALCIUM 8.0* 8.2*   No results for input(s): "LABPT", "INR" in the last 72 hours. No results for input(s): "LABURIN" in the last 72 hours. Results for orders placed or performed in visit on 09/13/22  Microscopic Examination     Status: Abnormal   Collection Time: 09/13/22  9:23 AM   Urine  Result Value Ref Range Status   WBC, UA 0-5 0 - 5 /hpf Final   RBC, Urine >30 (A) 0 - 2 /hpf Final   Epithelial Cells (non renal) 0-10 0 - 10 /hpf Final   Crystals Present (A) N/A Final   Crystal Type Amorphous Sediment N/A Final   Bacteria, UA None seen None seen/Few Final    Studies/Results: No results found.  Assessment/Plan: POD#1 robotic nephroureterectomy Ambulate in halls with assistance Advance diet as tolerated.  Continue current pain control regiment   LOS: 1 day    Wilkie Aye 11/09/2022, 7:43 PM

## 2022-11-09 NOTE — Progress Notes (Signed)
Mobility Specialist Progress Note:   11/09/22 1201  Mobility  Activity Contraindicated/medical hold  Mobility Referral Yes   Pt received in bed. Deferred mobility at this time d/t intense (rated 10/10) pain from procedure. Left pt in bed, all needs met.   Feliciana Rossetti Mobility Specialist Please contact via Special educational needs teacher or  Rehab office at [phone]

## 2022-11-10 ENCOUNTER — Encounter (HOSPITAL_COMMUNITY): Payer: Self-pay | Admitting: Urology

## 2022-11-10 LAB — GLUCOSE, CAPILLARY
Glucose-Capillary: 134 mg/dL — ABNORMAL HIGH (ref 70–99)
Glucose-Capillary: 136 mg/dL — ABNORMAL HIGH (ref 70–99)
Glucose-Capillary: 163 mg/dL — ABNORMAL HIGH (ref 70–99)
Glucose-Capillary: 168 mg/dL — ABNORMAL HIGH (ref 70–99)

## 2022-11-10 NOTE — TOC Initial Note (Addendum)
Transition of Care [facility]) - Initial/Assessment Note    Patient Details  Name: Andrew [name] MRN: [id number] Date of Birth: 03-07-1951  Transition of Care [facility]) CM/SW Contact:    Villa Herb, LCSWA Phone Number: 11/10/2022, 10:38 AM  Clinical Narrative:                 CSW updated that PT is recommending HH PT for pt at D/C. CSW spoke with pts daughter to complete assessment. Pt lives alone but his daughter lives close and checks in often. Pts daughter states once pt D/C she plans for him to stay with her for a couple days. Pt completed his ADL's independently. Pt is able to drive to local appointments but his daughter provides transportation when needed. Pt has had HH in the past. CSW inquired about interest in this being set up, pts daughter states it did not assist pt much. She is agreeable to OP PT referral and states that pt was already set up to have appointments with OP PT in [place] once he has surgery. CSW sent new referral over for OP PT [place]. Pt has a cane to use in the home when needed. Pt does not wear O2 at baseline. CSW updated that pt will need a rolling walker at D/C. CSW spoke with pts daughter who is agreeable to this being ordered. CSW gave referral to [facility] with [employer], RW will be delivered to pts room prior to D/C. TOC to follow.   Expected Discharge Plan: OP Rehab Barriers to Discharge: Continued Medical Work up   Patient Goals and CMS Choice Patient states their goals for this hospitalization and ongoing recovery are:: return home CMS Medicare.gov Compare Post Acute Care list provided to:: Patient Represenative (must comment) Choice offered to / list presented to : Adult Children      Expected Discharge Plan and Services In-house Referral: Clinical Social Work Discharge Planning Services: CM Consult   Living arrangements for the past 2 months: Single Family Home                                      Prior Living Arrangements/Services Living  arrangements for the past 2 months: Single Family Home Lives with:: Self Patient language and need for interpreter reviewed:: Yes Do you feel safe going back to the place where you live?: Yes      Need for Family Participation in Patient Care: Yes (Comment) Care giver support system in place?: Yes (comment)   Criminal Activity/Legal Involvement Pertinent to Current Situation/Hospitalization: No - Comment as needed  Activities of Daily Living Home Assistive Devices/Equipment: Cane (specify quad or straight) ADL Screening (condition at time of admission) Patient's cognitive ability adequate to safely complete daily activities?: Yes Is the patient deaf or have difficulty hearing?: No Does the patient have difficulty seeing, even when wearing glasses/contacts?: No Does the patient have difficulty concentrating, remembering, or making decisions?: No Patient able to express need for assistance with ADLs?: Yes Does the patient have difficulty dressing or bathing?: No Independently performs ADLs?: Yes (appropriate for developmental age) Does the patient have difficulty walking or climbing stairs?: Yes Weakness of Legs: Both Weakness of Arms/Hands: None  Permission Sought/Granted                  Emotional Assessment Appearance:: Appears stated age Attitude/Demeanor/Rapport: Engaged Affect (typically observed): Accepting Orientation: : Oriented to Self, Oriented to Place, Oriented  to  Time, Oriented to Situation Alcohol / Substance Use: Not Applicable Psych Involvement: No (comment)  Admission diagnosis:  Ureteral cancer, left [facility]) [C66.2] Patient Active Problem List   Diagnosis Date Noted   Ureteral cancer, left (HCC) 11/08/2022   Left renal mass 09/06/2022   History of pericarditis 04/26/2019   History of ST elevation myocardial infarction (STEMI) 09/05/2018   Unstable angina (HCC) 09/04/2018   Presence of drug coated stent in ramus intermedius coronary artery 05/12/2018    COPD with acute exacerbation (HCC) 01/21/2018   Multivessel CAD - CTO dLAD, mCx. DES PCI RI, PTCA of RPDA 01/21/2018   Stable angina 01/20/2018   Depression 01/20/2018   Essential hypertension 01/20/2018   Sleep apnea 10/06/2016   Tobacco use disorder 12/19/2015   Type 2 diabetes mellitus with circulatory disorder, without long-term current use of insulin (HCC) 12/19/2015   Hyperlipidemia with target LDL less than 70 12/19/2015   Bladder cancer (HCC) 12/19/2015   Esophageal reflux 12/19/2015   Essential tremor 12/19/2015   History of stroke 12/19/2015   PCP:  Stevphen Rochester, MD Pharmacy:   [facility] [place], [place] - [address] [place] [address] Phone: [phone] Fax: [phone]  [facility] [address] [place] [address] Phone: [phone] Fax: [phone]  [facility] - [place], [place] - [address] [place] [address] Phone: [phone] Fax: [phone]     Social Determinants of Health (SDOH) Social History: SDOH Screenings   Food Insecurity: No Food Insecurity (11/08/2022)  Housing: Low Risk  (11/08/2022)  Transportation Needs: No Transportation Needs (11/08/2022)  Utilities: Not At Risk (11/08/2022)  Depression (PHQ2-9): Medium Risk (11/19/2019)  Financial Resource Strain: Low Risk  (08/11/2017)  Physical Activity: Sufficiently Active (08/11/2017)  Social Connections: Moderately Isolated (07/24/2018)  Stress: No Stress Concern Present (07/24/2018)  Tobacco Use: Medium Risk (11/08/2022)   SDOH Interventions:     Readmission Risk Interventions     No data to display

## 2022-11-10 NOTE — Evaluation (Signed)
Physical Therapy Evaluation Patient Details Name: Andrew [name] MRN: [id number] DOB: [birth year] Today's Date: 11/10/2022  History of Present Illness  Andrew [name] is a 72yo male, s/p  1.  Left robot assisted laparoscopic radical nephroureterectomy  2.  Lysis of adhesions, moderate. He removed his stent POD#3. Pathology is high grade TCC. He has mild left flank pain and mild left testicular pain. His hematuria resolved yesterday.   Clinical Impression  Patient demonstrates slow labored movement for sitting up at bedside, had to lean on armrest of chair during transfers without AD, required use of RW for safety with good return for using, limited for gait training mostly due to fatigue and generalized weakness.  Patient tolerated sitting up in chair with SpO2 at 96% on room air after therapy - nurse aware.  Patient will benefit from continued skilled physical therapy in hospital and recommended venue below to increase strength, balance, endurance for safe ADLs and gait.          Recommendations for follow up therapy are one component of a multi-disciplinary discharge planning process, led by the attending physician.  Recommendations may be updated based on patient status, additional functional criteria and insurance authorization.  Follow Up Recommendations       Assistance Recommended at Discharge Set up Supervision/Assistance  Patient can return home with the following  A little help with walking and/or transfers;A little help with bathing/dressing/bathroom;Help with stairs or ramp for entrance;Assistance with cooking/housework    Equipment Recommendations Rolling walker (2 wheels)  Recommendations for Other Services       Functional Status Assessment Patient has had a recent decline in their functional status and demonstrates the ability to make significant improvements in function in a reasonable and predictable amount of time.     Precautions / Restrictions Precautions Precautions:  Fall Restrictions Weight Bearing Restrictions: No      Mobility  Bed Mobility Overal bed mobility: Needs Assistance Bed Mobility: Supine to Sit     Supine to sit: Supervision, Min guard     General bed mobility comments: increased time with labored movement    Transfers Overall transfer level: Needs assistance Equipment used: None, Rolling walker (2 wheels) Transfers: Sit to/from Stand, Bed to chair/wheelchair/BSC Sit to Stand: Min guard   Step pivot transfers: Min guard, Min assist       General transfer comment: has to lean on armrest of chair during transfer without AD, required use of RW for safety    Ambulation/Gait Ambulation/Gait assistance: Min guard, Min assist Gait Distance (Feet): 75 Feet Assistive device: Rolling walker (2 wheels) Gait Pattern/deviations: Decreased step length - right, Decreased step length - left, Decreased stride length, Trunk flexed Gait velocity: decreased     General Gait Details: slow labored cadence with frequent standing rest breaks, no loss of balance, limited mostly due to fatigue, on room air with SpO2 at 95%  Stairs            Wheelchair Mobility    Modified Rankin (Stroke Patients Only)       Balance Overall balance assessment: Needs assistance Sitting-balance support: Feet supported, No upper extremity supported Sitting balance-Leahy Scale: Good Sitting balance - Comments: seated at EOB   Standing balance support: During functional activity, No upper extremity supported Standing balance-Leahy Scale: Poor Standing balance comment: fair/good using RW                             Pertinent Vitals/Pain  Pain Assessment Pain Assessment: No/denies pain    Home Living Family/patient expects to be discharged to:: Private residence Living Arrangements: Alone Available Help at Discharge: Family;Available 24 hours/day Type of Home: House Home Access: Ramped entrance       Home Layout: One  level Home Equipment: Cane - single point      Prior Function Prior Level of Function : Independent/Modified Independent;Driving             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, drives, shops ADLs Comments: Independent     Hand Dominance        Extremity/Trunk Assessment   Upper Extremity Assessment Upper Extremity Assessment: Generalized weakness    Lower Extremity Assessment Lower Extremity Assessment: Generalized weakness    Cervical / Trunk Assessment Cervical / Trunk Assessment: Kyphotic  Communication   Communication: No difficulties  Cognition Arousal/Alertness: Awake/alert Behavior During Therapy: WFL for tasks assessed/performed Overall Cognitive Status: Within Functional Limits for tasks assessed                                          General Comments      Exercises     Assessment/Plan    PT Assessment Patient needs continued PT services  PT Problem List Decreased strength;Decreased activity tolerance;Decreased balance;Decreased mobility       PT Treatment Interventions DME instruction;Gait training;Stair training;Functional mobility training;Therapeutic activities;Therapeutic exercise;Patient/family education;Balance training    PT Goals (Current goals can be found in the Care Plan section)  Acute Rehab PT Goals Patient Stated Goal: return home with family to assist PT Goal Formulation: With patient Time For Goal Achievement: 11/17/22 Potential to Achieve Goals: Good    Frequency Min 3X/week     Co-evaluation               AM-PAC PT "6 Clicks" Mobility  Outcome Measure Help needed turning from your back to your side while in a flat bed without using bedrails?: None Help needed moving from lying on your back to sitting on the side of a flat bed without using bedrails?: A Little Help needed moving to and from a bed to a chair (including a wheelchair)?: A Little Help needed standing up from a  chair using your arms (e.g., wheelchair or bedside chair)?: A Little Help needed to walk in hospital room?: A Little Help needed climbing 3-5 steps with a railing? : A Lot 6 Click Score: 18    End of Session   Activity Tolerance: Patient tolerated treatment well;Patient limited by fatigue Patient left: in chair;with call bell/phone within reach Nurse Communication: Mobility status PT Visit Diagnosis: Unsteadiness on feet (R26.81);Other abnormalities of gait and mobility (R26.89);Muscle weakness (generalized) (M62.81)    Time: 1610-9604 PT Time Calculation (min) (ACUTE ONLY): 27 min   Charges:   PT Evaluation $PT Eval Moderate Complexity: 1 Mod PT Treatments $Therapeutic Activity: 23-37 mins        12:18 PM, 11/10/22 Ocie Bob, MPT Physical Therapist with [facility] 336 [phone] office [phone] mobile phone

## 2022-11-10 NOTE — Plan of Care (Signed)
  Problem: Acute Rehab PT Goals(only PT should resolve) Goal: Pt Will Go Supine/Side To Sit Outcome: Progressing Flowsheets (Taken 11/10/2022 1219) Pt will go Supine/Side to Sit: with modified independence Goal: Patient Will Transfer Sit To/From Stand Outcome: Progressing Flowsheets (Taken 11/10/2022 1219) Patient will transfer sit to/from stand: with modified independence Goal: Pt Will Transfer Bed To Chair/Chair To Bed Outcome: Progressing Flowsheets (Taken 11/10/2022 1219) Pt will Transfer Bed to Chair/Chair to Bed:  with modified independence  with supervision Goal: Pt Will Ambulate Outcome: Progressing Flowsheets (Taken 11/10/2022 1219) Pt will Ambulate:  100 feet  with modified independence  with supervision  with rolling walker   12:19 PM, 11/10/22 Andrew [name], MPT Physical Therapist with [facility] 336 [phone] office [phone] mobile phone

## 2022-11-10 NOTE — Progress Notes (Signed)
2 Days Post-Op Subjective: Patient reports moderate incisional pain. Patient has ambulated to chair. Tolerating clears  Objective: Vital signs in last 24 hours: Temp:  [98.2 F (36.8 C)-99.4 F (37.4 C)] 98.2 F (36.8 C) (06/19 1501) Pulse Rate:  [94-99] 94 (06/19 1501) Resp:  [18-20] 18 (06/19 1501) BP: (114-129)/(69-83) 129/83 (06/19 1501) SpO2:  [94 %-97 %] 97 % (06/19 1501)  Intake/Output from previous day: 06/18 0701 - 06/19 0700 In: -  Out: 2939 [Urine:2900; Drains:39] Intake/Output this shift: No intake/output data recorded.  Physical Exam:  General:alert, cooperative, and appears stated age GI: soft, non tender, normal bowel sounds, no palpable masses, no organomegaly, no inguinal hernia Male genitalia: not done Extremities: extremities normal, atraumatic, no cyanosis or edema  Lab Results: Recent Labs    11/08/22 1356 11/09/22 0449  HGB 12.9* 11.9*  HCT 38.8* 35.9*   BMET Recent Labs    11/08/22 1356 11/09/22 0449  NA 138 137  K 4.3 4.0  CL 108 104  CO2 22 24  GLUCOSE 216* 178*  BUN 16 17  CREATININE 1.00 1.10  CALCIUM 8.0* 8.2*   No results for input(s): "LABPT", "INR" in the last 72 hours. No results for input(s): "LABURIN" in the last 72 hours. Results for orders placed or performed in visit on 09/13/22  Microscopic Examination     Status: Abnormal   Collection Time: 09/13/22  9:23 AM   Urine  Result Value Ref Range Status   WBC, UA 0-5 0 - 5 /hpf Final   RBC, Urine >30 (A) 0 - 2 /hpf Final   Epithelial Cells (non renal) 0-10 0 - 10 /hpf Final   Crystals Present (A) N/A Final   Crystal Type Amorphous Sediment N/A Final   Bacteria, UA None seen None seen/Few Final    Studies/Results: No results found.  Assessment/Plan: POD#2 robotic nephroureterectomy Ambulate in halls with assistance Advance diet as tolerated.  PT eval Continue current pain control regiment   LOS: 2 days   Wilkie Aye 11/10/2022, 8:54 PM

## 2022-11-11 LAB — SURGICAL PATHOLOGY

## 2022-11-11 LAB — GLUCOSE, CAPILLARY
Glucose-Capillary: 155 mg/dL — ABNORMAL HIGH (ref 70–99)
Glucose-Capillary: 284 mg/dL — ABNORMAL HIGH (ref 70–99)
Glucose-Capillary: 192 mg/dL — ABNORMAL HIGH (ref 70–99)
Glucose-Capillary: 213 mg/dL — ABNORMAL HIGH (ref 70–99)
Glucose-Capillary: 185 mg/dL — ABNORMAL HIGH (ref 70–99)

## 2022-11-11 NOTE — Progress Notes (Signed)
3 Days Post-Op Subjective: Patient reports moderate incisional pain. Positive flatus. Tolerating regular diet Objective: Vital signs in last 24 hours: Temp:  [97.6 F (36.4 C)-98.8 F (37.1 C)] 98.2 F (36.8 C) (06/20 1946) Pulse Rate:  [86-92] 92 (06/20 1946) Resp:  [18-20] 20 (06/20 1946) BP: (120-162)/(57-77) 134/77 (06/20 1946) SpO2:  [94 %-97 %] 95 % (06/20 1946)  Intake/Output from previous day: 06/19 0701 - 06/20 0700 In: 1020 [P.O.:1020] Out: 2144 [Urine:2100; Drains:44] Intake/Output this shift: No intake/output data recorded.  Physical Exam:  General:alert, cooperative, and appears stated age GI: soft, non tender, normal bowel sounds, no palpable masses, no organomegaly, no inguinal hernia Male genitalia: not done Extremities: extremities normal, atraumatic, no cyanosis or edema  Lab Results: Recent Labs    11/09/22 0449  HGB 11.9*  HCT 35.9*   BMET Recent Labs    11/09/22 0449  NA 137  K 4.0  CL 104  CO2 24  GLUCOSE 178*  BUN 17  CREATININE 1.10  CALCIUM 8.2*   No results for input(s): "LABPT", "INR" in the last 72 hours. No results for input(s): "LABURIN" in the last 72 hours. Results for orders placed or performed in visit on 09/13/22  Microscopic Examination     Status: Abnormal   Collection Time: 09/13/22  9:23 AM   Urine  Result Value Ref Range Status   WBC, UA 0-5 0 - 5 /hpf Final   RBC, Urine >30 (A) 0 - 2 /hpf Final   Epithelial Cells (non renal) 0-10 0 - 10 /hpf Final   Crystals Present (A) N/A Final   Crystal Type Amorphous Sediment N/A Final   Bacteria, UA None seen None seen/Few Final    Studies/Results: No results found.  Assessment/Plan: POD#3 robotic nephroureterectomy Ambulate in halls with assistance Regular diet Continue current pain control regiment   LOS: 3 days   Wilkie Aye 11/11/2022, 9:19 PM

## 2022-11-11 NOTE — Progress Notes (Signed)
   11/11/22 1501  Vitals  Temp 98 F (36.7 C)  Temp Source Oral  BP 127/73  MAP (mmHg) 89  BP Location Right Arm  BP Method Automatic  Patient Position (if appropriate) Lying  Pulse Rate 88  Pulse Rate Source Dinamap  Resp 20  MEWS COLOR  MEWS Score Color Green  Oxygen Therapy  SpO2 94 %  O2 Device Room Air  MEWS Score  MEWS Temp 0  MEWS Systolic 0  MEWS Pulse 0  MEWS RR 0  MEWS LOC 0  MEWS Score 0     Pt diaphoretic and says he feels more tired than normal. Obtained vitals signs and MD contacted.

## 2022-11-12 LAB — GLUCOSE, CAPILLARY
Glucose-Capillary: 172 mg/dL — ABNORMAL HIGH (ref 70–99)
Glucose-Capillary: 257 mg/dL — ABNORMAL HIGH (ref 70–99)

## 2022-11-12 MED ORDER — OXYCODONE HCL 5 MG PO TABS: 5.0000 mg | ORAL_TABLET | ORAL | 0 refills | Status: DC | PRN

## 2022-11-12 NOTE — Care Management Important Message (Signed)
Important Message  Patient Details  Name: FAIZAAN FALLS MRN: [id number] Date of Birth: [birth year]   Medicare Important Message Given:  Yes     Corey Harold 11/12/2022, 9:50 AM

## 2022-11-16 NOTE — Discharge Summary (Signed)
Physician Discharge Summary  Patient ID: Andrew [name] MRN: [id number] DOB/AGE: 1951/02/07 72 y.o.  Admit date: 11/08/2022 Discharge date: 11/12/2022  Admission Diagnoses:  Ureteral cancer, left [facility])  Discharge Diagnoses:  Principal Problem:   Ureteral cancer, left [facility])   Past Medical History:  Diagnosis Date   Acute viral pericarditis 09/03/2018   Inferior STE - but Negative Troponin.  CP &SOB.  Felt to be Viral.   Anemia    Arthritis    Bladder cancer (HCC) 2014   CAD (coronary artery disease)    Cataract    bilateral   Complication of anesthesia    Depression    Diabetes mellitus without complication (HCC)    Essential hypertension 01/20/2018   in the past no longer on medication   GERD (gastroesophageal reflux disease)    Hx of adenomatous colonic polyps 09/2017   colonoscopy   Hyperlipidemia with target LDL less than 70 12/19/2015   Intraoperative floppy iris syndrome (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 DES PCI RI, PTCA of RPDA 01/21/2018   12/2017 - Cath for ? STEMI -> distal/apical LAD & m-dCx CTO (unable to cross Cx).  Mod rPDA. Severe RI - DES PCI.  08/2018 - Cath for ? Inf STEMI - RI stent patent & CTO dLAD/mCx. Progression of rPDA 95% -> PTCA only.  Thought to be Pericarditis & not MI (troponin negative).    Neuromuscular disorder (HCC)    Sleep apnea    BiPAP not currently being used   Status post tendon repair 1989   STEMI (ST elevation myocardial infarction) (HCC) 01/20/2018   Cardiac cath January 21, 2018: Severe multivessel disease with unclear lesion, but opted for PCI/DES x1 to the RI.  Appeared to have CTO of the distal LAD and circumflex as well as moderate mid LAD and diagonal disease as well as PDA.Marland Kitchen  Unable to cross circumflex lesion.   STEMI (ST elevation myocardial infarction) (HCC) 03/2022   Stroke (HCC) 11/2015   At times pt has dizziness with loss of vision   Tremors of nervous system 2011     Surgeries: Procedure(s): XI ROBOT ASSITED LAPAROSCOPIC NEPHROURETERECTOMY CYSTOSCOPY on 11/08/2022   Consultants (if any):   Discharged Condition: Improved  Hospital Course: Andrew [name] is an 72 y.o. male who was admitted 11/08/2022 with a diagnosis of Ureteral cancer, left (HCC) and went to the operating room on 11/08/2022 and underwent the above named procedures.    He was given perioperative antibiotics:  Anti-infectives (From admission, onward)    Start     Dose/Rate Route Frequency Ordered Stop   11/08/22 0622  ceFAZolin (ANCEF) IVPB 2g/100 mL premix        2 g 200 mL/hr over 30 Minutes Intravenous 30 min pre-op 11/08/22 0622 11/08/22 0838     .  He was given sequential compression devices, early ambulation for DVT prophylaxis.  He benefited maximally from the hospital stay and there were no complications.    Recent vital signs:  Vitals:   11/11/22 1946 11/12/22 0403  BP: 134/77 133/73  Pulse: 92 81  Resp: 20 18  Temp: 98.2 F (36.8 C) 98 F (36.7 C)  SpO2: 95% 97%    Recent laboratory studies:  Lab Results  Component Value Date   HGB 11.9 (L) 11/09/2022   HGB 12.9 (L) 11/08/2022   HGB 15.3 11/04/2022   Lab Results  Component Value Date   WBC 8.5 11/09/2022   PLT  166 11/09/2022   Lab Results  Component Value Date   INR 1.00 01/21/2018   Lab Results  Component Value Date   NA 137 11/09/2022   K 4.0 11/09/2022   CL 104 11/09/2022   CO2 24 11/09/2022   BUN 17 11/09/2022   CREATININE 1.10 11/09/2022   GLUCOSE 178 (H) 11/09/2022    Discharge Medications:   Allergies as of 11/12/2022       Reactions   Tramadol Shortness Of Breath   And dizziness   Benadryl [diphenhydramine] Other (See Comments)   Patient was "knocked out for 3 days after surgery" patient received benadryl for severe itching after surgery   Betadine [povidone Iodine] Itching   Patient had severe itching after surgery in the area betadine was used   Trulicity [dulaglutide]  Swelling   Ankles and feet swell        Medication List     TAKE these medications    aspirin EC 81 MG tablet Take 81 mg by mouth daily. Swallow whole.   clopidogrel 75 MG tablet Commonly known as: PLAVIX Take 75 mg by mouth daily.   empagliflozin 25 MG Tabs tablet Commonly known as: JARDIANCE Take 25 mg by mouth daily.   fenofibrate 48 MG tablet Commonly known as: Tricor Take 1 tablet (48 mg total) by mouth daily.   furosemide 40 MG tablet Commonly known as: LASIX Take 20 mg by mouth daily.   glimepiride 2 MG tablet Commonly known as: AMARYL Take 2 mg by mouth daily before breakfast.   isosorbide mononitrate 30 MG 24 hr tablet Commonly known as: IMDUR Take 1 tablet (30 mg total) by mouth daily. What changed:  how much to take when to take this   levothyroxine 50 MCG tablet Commonly known as: SYNTHROID Take 50 mcg by mouth daily before breakfast.   LUBRICATING EYE DROPS OP Place 1 drop into both eyes daily as needed (dry eyes).   metFORMIN 1000 MG tablet Commonly known as: GLUCOPHAGE Take 1 tablet by mouth 2 times daily with a meal. What changed:  how much to take how to take this when to take this additional instructions   metoprolol succinate 25 MG 24 hr tablet Commonly known as: TOPROL-XL Take 25 mg by mouth daily.   nitroGLYCERIN 0.4 MG SL tablet Commonly known as: NITROSTAT PLACE 1 TABLET UNDER THE TONGUE AT ONSET OF CHEST PAIN EVERY 5 MINTUES UP TO 3 TIMES AS NEEDED What changed: See the new instructions.   OneTouch Verio test strip Generic drug: glucose blood daily.   oxyCODONE 5 MG immediate release tablet Commonly known as: Oxy IR/ROXICODONE Take 1 tablet (5 mg total) by mouth every 4 (four) hours as needed for moderate pain.   oxyCODONE-acetaminophen 5-325 MG tablet Commonly known as: Percocet Take 1 tablet by mouth every 4 (four) hours as needed for severe pain.   pantoprazole 40 MG tablet Commonly known as: PROTONIX Take 1  tablet (40 mg total) by mouth 2 (two) times daily. Please keep your June office visit for further refills. Thank you   PreserVision AREDS 2 Caps Take 1 capsule by mouth 2 (two) times daily.   primidone 50 MG tablet Commonly known as: MYSOLINE Take 1 tablet (50 mg total) by mouth 4 (four) times daily. What changed: how much to take   ranolazine 500 MG 12 hr tablet Commonly known as: RANEXA Take 500 mg by mouth 2 (two) times daily.   rosuvastatin 40 MG tablet Commonly known as: CRESTOR Take 1  tablet (40 mg total) by mouth daily.   tamsulosin 0.4 MG Caps capsule Commonly known as: FLOMAX Take 1 capsule (0.4 mg total) by mouth daily.   Evaristo Bury FlexTouch 100 UNIT/ML FlexTouch Pen Generic drug: insulin degludec Inject 14 Units into the skin daily.   Vitamin D 50 MCG (2000 UT) tablet Take 2,000 Units by mouth 2 (two) times daily.   Voltaren 1 % Gel Generic drug: diclofenac Sodium Apply 1 Application topically 4 (four) times daily as needed (pain).        Diagnostic Studies: No results found.  Disposition: Discharge disposition: 01-Home or Self Care       Discharge Instructions     Ambulatory referral to Physical Therapy   Complete by: As directed    Discharge patient   Complete by: As directed    Discharge disposition: 01-Home or Self Care   Discharge patient date: 11/12/2022   Discharge patient   Complete by: As directed    Discharge disposition: 01-Home or Self Care   Discharge patient date: 11/12/2022        Follow-up Information     Malen Gauze, MD. Call in 2 week(s).   Specialty: Urology Contact information: [address]  [place] [address] [phone]                  Signed: Wilkie Aye 11/16/2022, 8:33 AM

## 2022-11-24 ENCOUNTER — Ambulatory Visit (INDEPENDENT_AMBULATORY_CARE_PROVIDER_SITE_OTHER): Payer: Medicare Other | Admitting: Urology

## 2022-11-24 ENCOUNTER — Ambulatory Visit (HOSPITAL_COMMUNITY)
Admission: RE | Admit: 2022-11-24 | Discharge: 2022-11-24 | Disposition: A | Discharge status: Home / Self Care | Payer: Medicare Other | Source: Ambulatory Visit | Attending: Urology | Admitting: Urology

## 2022-11-24 VITALS — BP 97/56 | HR 95

## 2022-11-24 DIAGNOSIS — C679 Malignant neoplasm of bladder, unspecified: Secondary | ICD-10-CM

## 2022-11-24 DIAGNOSIS — R319 Hematuria, unspecified: Secondary | ICD-10-CM | POA: Diagnosis not present

## 2022-11-24 MED ORDER — CIPROFLOXACIN HCL 500 MG PO TABS
500.0000 mg | ORAL_TABLET | Freq: Once | ORAL | Status: AC
Start: 2022-11-24 — End: 2022-11-24
  Administered 2022-11-24: 500 mg via ORAL

## 2022-11-24 NOTE — Patient Instructions (Addendum)
CT Instructions  Nothing to eat 4 hours prior to appointment  Patient can have 2 cups of water during those 4 hours.    Low-Purine Eating Plan A low-purine eating plan involves making food choices to limit your purine intake. Purine is a kind of uric acid. Too much uric acid in your blood can cause certain conditions, such as gout and kidney stones. Eating a low-purine diet may help control these conditions. What are tips for following this plan? Shopping Avoid buying products that contain high-fructose corn syrup. Check for this on food labels. It is commonly found in many processed foods and soft drinks. Be sure to check for it in baked goods such as cookies, canned fruits, and cereals and cereal bars. Avoid buying veal, chicken breast with skin, lamb, and organ meats such as liver. These types of meats tend to have the highest purine content. Choose dairy products. These may lower uric acid levels. Avoid certain types of fish. Not all fish and seafood have high purine content. Examples with high purine content include anchovies, trout, tuna, sardines, and salmon. Avoid buying beverages that contain alcohol, particularly beer and hard liquor. Alcohol can affect the way your body gets rid of uric acid. Meal planning  Learn which foods do or do not affect you. If you find out that a food tends to cause your gout symptoms to flare up, avoid eating that food. You can enjoy foods that do not cause problems. If you have any questions about a food item, talk with your dietitian or health care provider. Reduce the overall amount of meat in your diet. When you do eat meat, choose ones with lower purine content. Include plenty of fruits and vegetables. Although some vegetables may have a high purine content--such as asparagus, mushrooms, spinach, or cauliflower--it has been shown that these do not contribute to uric acid blood levels as much. Consume at least 1 dairy serving a day. This has been shown to  decrease uric acid levels. General information If you drink alcohol: Limit how much you have to: 0-1 drink a day for women who are not pregnant. 0-2 drinks a day for men. Know how much alcohol is in a drink. In the [place] one drink equals one 12 oz bottle of beer (355 mL), one 5 oz glass of wine (148 mL), or one 1 oz glass of hard liquor (44 mL). Drink plenty of water. Try to drink enough to keep your urine pale yellow. Fluids can help remove uric acid from your body. Work with your health care provider and dietitian to develop a plan to achieve or maintain a healthy weight. Losing weight may help reduce uric acid in your blood. What foods are recommended? The following are some types of foods that are good choices when limiting purine intake: Fresh or frozen fruits and vegetables. Whole grains, breads, cereals, and pasta. Rice. Beans, peas, legumes. Nuts and seeds. Dairy products. Fats and oils. The items listed above may not be a complete list. Talk with a dietitian about what dietary choices are best for you. What foods are not recommended? Limit your intake of foods high in purines, including: Beer and other alcohol. Meat-based gravy or sauce. Canned or fresh fish, such as: Anchovies, sardines, herring, salmon, and tuna. Mussels and scallops. Codfish, trout, and haddock. Bacon, veal, chicken breast with skin, and lamb. Organ meats, such as: Liver or kidney. Tripe. Sweetbreads (thymus gland or pancreas). Wild Education officer, environmental. Yeast or yeast extract supplements. Drinks sweetened  with high-fructose corn syrup, such as soda. Processed foods made with high-fructose corn syrup. The items listed above may not be a complete list of foods and beverages you should limit. Contact a dietitian for more information. Summary Eating a low-purine diet may help control conditions caused by too much uric acid in the body, such as gout or kidney stones. Choose low-purine foods, limit alcohol, and  limit high-fructose corn syrup. You will learn over time which foods do or do not affect you. If you find out that a food tends to cause your gout symptoms to flare up, avoid eating that food. This information is not intended to replace advice given to you by your health care provider. Make sure you discuss any questions you have with your health care provider. Document Revised: 04/23/2021 Document Reviewed: 04/23/2021 Elsevier Patient Education  2024 Else[employer] Food Basics for Chronic Kidney Disease Chronic kidney disease (CKD) is when your kidneys are not working well. They cannot remove waste, fluids, and other substances from your blood the way they should. These substances can build up, which can worsen kidney damage and affect how your body works. Eating certain foods can lead to a buildup of these substances. Changing your diet can help prevent more kidney damage. Diet changes may also delay dialysis or even keep you from needing it. What nutrients should I limit? Work with your treatment team and a food expert (dietitian) to make a meal plan that's right for you. Foods you can eat and foods you should limit or avoid will depend on the stage of your kidney disease and any other health conditions you have. The items listed below are not a complete list. Talk with your dietitian to learn what is best for you. Potassium Potassium affects how steadily your heart beats. Too much potassium in your blood can cause an irregular heartbeat or even a heart attack. You may need to limit foods that are high in potassium, such as: Liquid milk and soy milk. Salt substitutes that contain potassium. Fruits like bananas, apricots, nectarines, melon, prunes, raisins, kiwi, and oranges. Vegetables, such as potatoes, sweet potatoes, yams, tomatoes, leafy greens, beets, avocado, pumpkin, and winter squash. Beans, like lima beans. Nuts. Phosphorus Phosphorus is a mineral found in your bones. You need a  balance between calcium and phosphorus to build and maintain healthy bones. Too much added phosphorus from the foods you eat can pull calcium from your bones. Losing calcium can make your bones weak and more likely to break. Too much phosphorus can also make your skin itch. You may need to limit foods that are high in phosphorus or that have added phosphorus, such as: Liquid milk and dairy products. Dark-colored sodas or soft drinks. Bran cereals and oatmeal. Protein  Protein helps you make and keep muscle. Protein also helps to repair your body's cells and tissues. One of the natural breakdown products of protein is a waste product called urea. When your kidneys are not working well, they cannot remove types of waste like urea. Reducing protein in your diet can help keep urea from building up in your blood. Depending on your stage of kidney disease, you may need to eat smaller portions of foods that are high in protein. Sources of animal protein include: Meat (all types). Fish and seafood. Poultry. Eggs. Dairy. Other protein foods include: Beans and legumes. Nuts and nut butter. Soy, like tofu.  Sodium Salt (sodium) helps to keep a healthy balance of fluids in your body. Too much  salt can increase your blood pressure, which can harm your heart and lungs. Extra salt can also cause your body to keep too much fluid, making your kidneys work harder. You may need to limit or avoid foods that are high in salt, such as: Salt seasonings. Soy and teriyaki sauce. Packaged, precooked, cured, or processed meats, such as sausages or meat loaves. Sardines. Salted crackers and snack foods. Fast food. Canned soups and most canned foods. Pickled foods. Vegetable juice. Boxed mixes or ready-to-eat boxed meals and side dishes. Bottled dressings, sauces, and marinades. Talk with your dietitian about how much potassium, phosphorus, protein, and salt you may have each day. Helpful tips Read food  labels  Check the amount of salt in foods. Limit foods that have salt or sodium listed among the first five ingredients. Try to eat low-salt foods. Check the ingredient list for added phosphorus or potassium. "Phos" in an ingredient is a sign that phosphorus has been added. Do not buy foods that are calcium-enriched or that have calcium added to them (are fortified). Buy canned vegetables and beans that say "no salt added" and rinse them before eating. Lifestyle Limit the amount of protein you eat from animal sources each day. Focus on protein from plant sources, like tofu and dried beans, peas, and lentils. Do not add salt to food when cooking or before eating. Do not eat star fruit. It can be toxic for people with kidney problems. Talk with your health care provider before taking any vitamin or mineral supplements. If told by your health care provider, track how much liquid you drink so you can avoid drinking too much. You may need to include foods you eat that are made mostly from water, like gelatin, ice cream, soups, and juicy fruits and vegetables. If you have diabetes: If you have diabetes (diabetes mellitus) and CKD, you need to keep your blood sugar (glucose) in the target range recommended by your health care provider. Follow your diabetes management plan. This may include: Checking your blood glucose regularly. Taking medicines by mouth, or taking insulin, or both. Exercising for at least 30 minutes on 5 or more days each week, or as told by your health care provider. Tracking how many servings of carbohydrates you eat at each meal. Not using orange juice to treat low blood sugars. Instead, use apple juice, cranberry juice, or clear soda. You may be given guidelines on what foods and nutrients you may eat, and how much you can have each day. This depends on your stage of kidney disease and whether you have high blood pressure (hypertension). Follow the meal plan your dietitian gives  you. To learn more: [employer] of Diabetes and Digestive and Kidney Diseases: [employer]: kidney.org Summary Chronic kidney disease (CKD) is when your kidneys are not working well. They cannot remove waste, fluids, and other substances from your blood the way they should. These substances can build up, which can worsen kidney damage and affect how your body works. Changing your diet can help prevent more kidney damage. Diet changes may also delay dialysis or even keep you from needing it. Diet changes are different for each person with CKD. Work with a dietitian to set up a meal plan that is right for you. This information is not intended to replace advice given to you by your health care provider. Make sure you discuss any questions you have with your health care provider. Document Revised: 08/28/2021 Document Reviewed: 09/03/2019 Elsevier Patient Education  2024  [employer].

## 2022-11-24 NOTE — Progress Notes (Signed)
11/24/2022 11:31 AM   Carolynn Serve 1950/08/10 [id number]  Referring provider: Stevphen Rochester, MD [address] [place],  [place] [address]  Followup upper tract TCC   HPI: Mr Drawdy is a 71yo here for followup after nephrourterectomy. Pathology high grade non invasive TCC. Negative margins. Appetite has improved. Cystogram showed no leak and foley removed today   PMH: Past Medical History:  Diagnosis Date   Acute viral pericarditis 09/03/2018   Inferior STE - but Negative Troponin.  CP &SOB.  Felt to be Viral.   Anemia    Arthritis    Bladder cancer (HCC) 2014   CAD (coronary artery disease)    Cataract    bilateral   Complication of anesthesia    Depression    Diabetes mellitus without complication (HCC)    Essential hypertension 01/20/2018   in the past no longer on medication   GERD (gastroesophageal reflux disease)    Hx of adenomatous colonic polyps 09/2017   colonoscopy   Hyperlipidemia with target LDL less than 70 12/19/2015   Intraoperative floppy iris syndrome (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 DES PCI RI, PTCA of RPDA 01/21/2018   12/2017 - Cath for ? STEMI -> distal/apical LAD & m-dCx CTO (unable to cross Cx).  Mod rPDA. Severe RI - DES PCI.  08/2018 - Cath for ? Inf STEMI - RI stent patent & CTO dLAD/mCx. Progression of rPDA 95% -> PTCA only.  Thought to be Pericarditis & not MI (troponin negative).    Neuromuscular disorder (HCC)    Sleep apnea    BiPAP not currently being used   Status post tendon repair 1989   STEMI (ST elevation myocardial infarction) (HCC) 01/20/2018   Cardiac cath January 21, 2018: Severe multivessel disease with unclear lesion, but opted for PCI/DES x1 to the RI.  Appeared to have CTO of the distal LAD and circumflex as well as moderate mid LAD and diagonal disease as well as PDA.Marland Kitchen  Unable to cross circumflex lesion.   STEMI (ST elevation myocardial infarction) (HCC) 03/2022    Stroke (HCC) 11/2015   At times pt has dizziness with loss of vision   Tremors of nervous system 2011    Surgical History: Past Surgical History:  Procedure Laterality Date   APPENDECTOMY     BLADDER SURGERY     COLONOSCOPY     CORONARY STENT INTERVENTION N/A 01/21/2018   Procedure: CORONARY STENT INTERVENTION;  Surgeon: Marykay Lex, MD;  Location: [facility] INVASIVE CV LAB;  Service: Cardiovascular;  Laterality: N/A;  95% Ramus Intermedius -PCI with synergy DES 2.25 mm x 12 mm postdilated 2.4 mm.   CORONARY/GRAFT ACUTE MI REVASCULARIZATION N/A 01/21/2018   Procedure: Coronary/Graft Acute MI Revascularization;  Surgeon: Marykay Lex, MD;  Location: [facility] INVASIVE CV LAB;  Service: Cardiovascular;  Laterality: N/A;  attempted revas to distal CFX;    CORONARY/GRAFT ACUTE MI REVASCULARIZATION N/A 09/04/2018   Procedure: Coronary/Graft Acute MI Revascularization;  Surgeon: Tonny Bollman, MD;  Location: [facility] INVASIVE CV LAB:: PTCA/POBA of rPDA (2.0 mm balloon)   CYSTOSCOPY N/A 01/21/2020   Procedure: CYSTOSCOPY;  Surgeon: Malen Gauze, MD;  Location: [facility];  Service: Urology;  Laterality: N/A;   CYSTOSCOPY N/A 02/09/2021   Procedure: CYSTOSCOPY;  Surgeon: Malen Gauze, MD;  Location: [facility];  Service: Urology;  Laterality: N/A;   CYSTOSCOPY Left 11/08/2022   Procedure: CYSTOSCOPY;  Surgeon: Wilkie Aye  L, MD;  Location: [facility];  Service: Urology;  Laterality: Left;   CYSTOSCOPY W/ RETROGRADES Bilateral 08/22/2015   Procedure: CYSTOSCOPY WITH RETROGRADE PYELOGRAM;  Surgeon: Bjorn Pippin, MD;  Location: [facility];  Service: Urology;  Laterality: Bilateral;   CYSTOSCOPY W/ RETROGRADES Bilateral 01/16/2016   Procedure: CYSTOSCOPY WITH RETROGRADE PYELOGRAM;  Surgeon: Bjorn Pippin, MD;  Location: [facility];  Service: Urology;  Laterality: Bilateral;   CYSTOSCOPY W/ RETROGRADES Bilateral 01/07/2017   Procedure: CYSTOSCOPY WITH RETROGRADE PYELOGRAM;  Surgeon: Bjorn Pippin, MD;  Location: [facility];   Service: Urology;  Laterality: Bilateral;   CYSTOSCOPY WITH BIOPSY N/A 08/22/2015   Procedure: CYSTOSCOPY WITH BLADDER BIOPSY;  Surgeon: Bjorn Pippin, MD;  Location: [facility];  Service: Urology;  Laterality: N/A;   CYSTOSCOPY WITH BIOPSY N/A 01/16/2016   Procedure: CYSTOSCOPY WITH BLADDER BIOPSY;  Surgeon: Bjorn Pippin, MD;  Location: [facility];  Service: Urology;  Laterality: N/A;   CYSTOSCOPY WITH FULGERATION N/A 01/16/2016   Procedure: CYSTOSCOPY WITH FULGERATION;  Surgeon: Bjorn Pippin, MD;  Location: [facility];  Service: Urology;  Laterality: N/A;   CYSTOSCOPY/RETROGRADE/URETEROSCOPY Left 09/06/2022   Procedure: CYSTOSCOPY/RETROGRADE/URETEROSCOPY/STENT PLACEMENT;  Surgeon: Malen Gauze, MD;  Location: [facility];  Service: Urology;  Laterality: Left;   KNEE ARTHROSCOPY Right 1989   LEFT HEART CATH AND CORONARY ANGIOGRAPHY N/A 01/21/2018   Procedure: LEFT HEART CATH AND CORONARY ANGIOGRAPHY;  Surgeon: Marykay Lex, MD;  Location: [facility] INVASIVE CV LAB;  Service: Cardiovascular;; 95% RI-DES PCI.  80% o-pCX (PTCA) -> dCX 80%-100% CTO (unsuccessful PTCA unable to cross distal lesion with balloon.)  100% CTO dLAD. RPDA 80% and 70% as well as P AV 180%, PA V2 60%.  (too small for PCI)   LEFT HEART CATH AND CORONARY ANGIOGRAPHY N/A 09/04/2018   Procedure: LEFT HEART CATH AND CORONARY ANGIOGRAPHY;  Surgeon: Tonny Bollman, MD;  Location: [facility] INVASIVE CV LAB: (presumed Inferior STEMI) = progression of small rPDA -95% (PTCA only). Patent RI stent. CTO of apical LAD & mCx.   ROBOT ASSITED LAPAROSCOPIC NEPHROURETERECTOMY Left 11/08/2022   Procedure: XI ROBOT ASSITED LAPAROSCOPIC NEPHROURETERECTOMY;  Surgeon: Malen Gauze, MD;  Location: [facility];  Service: Urology;  Laterality: Left;   ROTATOR CUFF REPAIR Bilateral 2000   rt. leg fracture surgery     TRANSTHORACIC ECHOCARDIOGRAM  01/21/2018   Mild LVH.  EF 50 and 55%.  Apical inferior hypokinesis.  Mid-apical anterolateral hypokinesis.  Normal diastolic function for  age    TRANSTHORACIC ECHOCARDIOGRAM  09/04/2018   -? Inf STEMI vs. Pericarditis:  Mildly reduced EF of 45 to 50%.  Impaired relaxation-GR 1 DD.  Severe hypokinesis of the anterolateral and inferolateral wall.  Small-moderate anterior pericardial effusion.  Moderate aortic sclerosis but no stenosis.   TRANSURETHRAL RESECTION OF BLADDER TUMOR N/A 01/07/2017   Procedure: TRANSURETHRAL RESECTION OF BLADDER TUMOR (TURBT);  Surgeon: Bjorn Pippin, MD;  Location: [facility];  Service: Urology;  Laterality: N/A;   TRANSURETHRAL RESECTION OF BLADDER TUMOR N/A 01/21/2020   Procedure: TRANSURETHRAL RESECTION OF BLADDER TUMOR (TURBT);  Surgeon: Malen Gauze, MD;  Location: [facility];  Service: Urology;  Laterality: N/A;   TRANSURETHRAL RESECTION OF BLADDER TUMOR N/A 02/09/2021   Procedure: TRANSURETHRAL RESECTION OF BLADDER TUMOR (TURBT);  Surgeon: Malen Gauze, MD;  Location: [facility];  Service: Urology;  Laterality: N/A;   URETERAL BIOPSY Left 09/06/2022   Procedure: URETERAL BIOPSY/fulgeration;  Surgeon: Malen Gauze, MD;  Location: [facility];  Service: Urology;  Laterality: Left;   WISDOM  TOOTH EXTRACTION      Home Medications:  Allergies as of 11/24/2022       Reactions   Tramadol Shortness Of Breath   And dizziness   Benadryl [diphenhydramine] Other (See Comments)   Patient was "knocked out for 3 days after surgery" patient received benadryl for severe itching after surgery   Betadine [povidone Iodine] Itching   Patient had severe itching after surgery in the area betadine was used   Trulicity [dulaglutide] Swelling   Ankles and feet swell        Medication List        Accurate as of November 24, 2022 11:31 AM. If you have any questions, ask your nurse or doctor.          aspirin EC 81 MG tablet Take 81 mg by mouth daily. Swallow whole.   clopidogrel 75 MG tablet Commonly known as: PLAVIX Take 75 mg by mouth daily.   empagliflozin 25 MG Tabs tablet Commonly known as:  JARDIANCE Take 25 mg by mouth daily.   fenofibrate 48 MG tablet Commonly known as: Tricor Take 1 tablet (48 mg total) by mouth daily.   furosemide 40 MG tablet Commonly known as: LASIX Take 20 mg by mouth daily.   glimepiride 2 MG tablet Commonly known as: AMARYL Take 2 mg by mouth daily before breakfast.   isosorbide mononitrate 30 MG 24 hr tablet Commonly known as: IMDUR Take 1 tablet (30 mg total) by mouth daily. What changed:  how much to take when to take this   levothyroxine 50 MCG tablet Commonly known as: SYNTHROID Take 50 mcg by mouth daily before breakfast.   LUBRICATING EYE DROPS OP Place 1 drop into both eyes daily as needed (dry eyes).   metFORMIN 1000 MG tablet Commonly known as: GLUCOPHAGE Take 1 tablet by mouth 2 times daily with a meal. What changed:  how much to take how to take this when to take this additional instructions   metoprolol succinate 25 MG 24 hr tablet Commonly known as: TOPROL-XL Take 25 mg by mouth daily.   nitroGLYCERIN 0.4 MG SL tablet Commonly known as: NITROSTAT PLACE 1 TABLET UNDER THE TONGUE AT ONSET OF CHEST PAIN EVERY 5 MINTUES UP TO 3 TIMES AS NEEDED What changed: See the new instructions.   OneTouch Verio test strip Generic drug: glucose blood daily.   oxyCODONE 5 MG immediate release tablet Commonly known as: Oxy IR/ROXICODONE Take 1 tablet (5 mg total) by mouth every 4 (four) hours as needed for moderate pain.   oxyCODONE-acetaminophen 5-325 MG tablet Commonly known as: Percocet Take 1 tablet by mouth every 4 (four) hours as needed for severe pain.   pantoprazole 40 MG tablet Commonly known as: PROTONIX Take 1 tablet (40 mg total) by mouth 2 (two) times daily. Please keep your June office visit for further refills. Thank you   PreserVision AREDS 2 Caps Take 1 capsule by mouth 2 (two) times daily.   primidone 50 MG tablet Commonly known as: MYSOLINE Take 1 tablet (50 mg total) by mouth 4 (four) times  daily. What changed: how much to take   ranolazine 500 MG 12 hr tablet Commonly known as: RANEXA Take 500 mg by mouth 2 (two) times daily.   rosuvastatin 40 MG tablet Commonly known as: CRESTOR Take 1 tablet (40 mg total) by mouth daily.   tamsulosin 0.4 MG Caps capsule Commonly known as: FLOMAX Take 1 capsule (0.4 mg total) by mouth daily.   Evaristo Bury FlexTouch 100  UNIT/ML FlexTouch Pen Generic drug: insulin degludec Inject 14 Units into the skin daily.   Vitamin D 50 MCG (2000 UT) tablet Take 2,000 Units by mouth 2 (two) times daily.   Voltaren 1 % Gel Generic drug: diclofenac Sodium Apply 1 Application topically 4 (four) times daily as needed (pain).        Allergies:  Allergies  Allergen Reactions   Tramadol Shortness Of Breath    And dizziness   Benadryl [Diphenhydramine] Other (See Comments)    Patient was "knocked out for 3 days after surgery" patient received benadryl for severe itching after surgery   Betadine [Povidone Iodine] Itching    Patient had severe itching after surgery in the area betadine was used   Trulicity [Dulaglutide] Swelling    Ankles and feet swell    Family History: Family History  Problem Relation Age of Onset   Diabetes Mother 32       type 1   Stroke Mother    Dementia Father    Diabetes Brother    Heart attack Brother 37   Testicular cancer Brother    Diabetes Brother    Cancer Brother        testicular   Colon cancer Neg Hx    Colon polyps Neg Hx    Esophageal cancer Neg Hx    Rectal cancer Neg Hx    Stomach cancer Neg Hx    Pancreatic cancer Neg Hx     Social History:  reports that he has quit smoking. His smoking use included cigarettes. He has a 50.00 pack-year smoking history. He has quit using smokeless tobacco. He reports that he does not drink alcohol and does not use drugs.  ROS: All other review of systems were reviewed and are negative except what is noted above in HPI  Physical Exam: BP (!) 97/56   Pulse  95   Constitutional:  Alert and oriented, No acute distress. HEENT: [place] AT, moist mucus membranes.  Trachea midline, no masses. Cardiovascular: No clubbing, cyanosis, or edema. Respiratory: Normal respiratory effort, no increased work of breathing. GI: Abdomen is soft, nontender, nondistended, no abdominal masses GU: No CVA tenderness.  Lymph: No cervical or inguinal lymphadenopathy. Skin: No rashes, bruises or suspicious lesions. Neurologic: Grossly intact, no focal deficits, moving all 4 extremities. Psychiatric: Normal mood and affect.  Laboratory Data: Lab Results  Component Value Date   WBC 8.5 11/09/2022   HGB 11.9 (L) 11/09/2022   HCT 35.9 (L) 11/09/2022   MCV 98.1 11/09/2022   PLT 166 11/09/2022    Lab Results  Component Value Date   CREATININE 1.10 11/09/2022    No results found for: "PSA"  No results found for: "TESTOSTERONE"  Lab Results  Component Value Date   HGBA1C 8.1 (H) 09/01/2022    Urinalysis    Component Value Date/Time   COLORURINE YELLOW 09/03/2018 0000   APPEARANCEUR Cloudy (A) 09/13/2022 0923   LABSPEC 1.028 09/03/2018 0000   PHURINE 5.0 09/03/2018 0000   GLUCOSEU 3+ (A) 09/13/2022 0923   HGBUR SMALL (A) 09/03/2018 0000   BILIRUBINUR Negative 09/13/2022 0923   KETONESUR NEGATIVE 09/03/2018 0000   PROTEINUR 2+ (A) 09/13/2022 0923   PROTEINUR NEGATIVE 09/03/2018 0000   UROBILINOGEN 0.2 10/15/2019 1606   NITRITE Negative 09/13/2022 0923   NITRITE NEGATIVE 09/03/2018 0000   LEUKOCYTESUR Negative 09/13/2022 0923   LEUKOCYTESUR NEGATIVE 09/03/2018 0000    Lab Results  Component Value Date   LABMICR See below: 09/13/2022   WBCUA 0-5  09/13/2022   RBCUA None seen 01/15/2016   LABEPIT 0-10 09/13/2022   MUCUS Present 11/16/2021   BACTERIA None seen 09/13/2022    Pertinent Imaging: Cystogram today: Images reviewed and discussed with the patient  No results found for this or any previous visit.  No results found for this or any previous  visit.  No results found for this or any previous visit.  No results found for this or any previous visit.  Results for orders placed during the hospital encounter of 06/21/22  Ultrasound renal complete  Narrative CLINICAL DATA:  left hydronephrosis  EXAM: RENAL / URINARY TRACT ULTRASOUND COMPLETE  COMPARISON:  September 04, 2018  FINDINGS: Right Kidney:  Renal measurements: 13.1 x 4.5 x 7.6 cm = volume: 232 mL. Echogenicity within normal limits. No mass or hydronephrosis visualized.  Left Kidney:  Assessment is limited secondary to overlapping bowel gas and limited acoustic windows. Renal measurements: 7.3 x 6.3 x 5.5 cm = volume: 140 mL. Echogenicity within normal limits. No mass visualized. There is mild LEFT-sided pelviectasis without frank hydronephrosis.  Bladder:  Appears normal for degree of bladder distention.  Other:  None.  IMPRESSION: There is mild LEFT-sided pelviectasis without frank hydronephrosis.  If concern for obstructive physiology, recommend dedicated CT scan abdomen pelvis with and without contrast.   Electronically Signed By: Meda Klinefelter M.D. On: 06/21/2022 16:11  No valid procedures specified. No results found for this or any previous visit.  Results for orders placed during the hospital encounter of 01/14/17  CT RENAL STONE STUDY  Narrative CLINICAL DATA:  Sharp back pain and hematuria. Recent bladder tumor removed 1 week ago.  EXAM: CT ABDOMEN AND PELVIS WITHOUT CONTRAST  TECHNIQUE: Multidetector CT imaging of the abdomen and pelvis was performed following the standard protocol without IV contrast.  COMPARISON:  None.  FINDINGS: Lower chest: No acute abnormality. Right coronary artery atherosclerotic vascular calcifications.  Hepatobiliary: Hepatic steatosis. Scattered punctate granulomas throughout the liver, consistent with prior granulomatous disease. The gallbladder is decompressed. No biliary  dilatation.  Pancreas: Unremarkable. No pancreatic ductal dilatation or surrounding inflammatory changes.  Spleen: Normal size without focal abnormality. Multiple punctate calcifications within the spleen, consistent with prior granulomatous disease.  Adrenals/Urinary Tract: The adrenal glands are unremarkable. No renal or ureteral calculi. Minimal left greater than right perinephric fat stranding, nonspecific. No hydronephrosis. The bladder is unremarkable.  Stomach/Bowel: Stomach is within normal limits. Appendix is surgically absent. No evidence of bowel wall thickening, distention, or inflammatory changes. Scattered left-sided colonic diverticulosis.  Vascular/Lymphatic: Aortic atherosclerosis. No enlarged abdominal or pelvic lymph nodes.  Reproductive: Prostate is unremarkable.  Other: No abdominal wall hernia or abnormality. No abdominopelvic ascites.  Musculoskeletal: No acute osseous abnormality. Degenerative changes of the thoracolumbar spine with severe lower lumbar facet arthropathy. 6 mm anterolisthesis of L4 on L5.  IMPRESSION: 1. No evidence of acute intra-abdominal process. No explanation for the patient's symptoms. 2. No renal or ureteral calculi.  No hydronephrosis. 3. Hepatic steatosis. 4.  Aortic atherosclerosis (ICD10-I70.0).   Electronically Signed By: Obie Dredge M.D. On: 01/14/2017 08:52   Assessment & Plan:    1. Malignant neoplasm of urinary bladder, unspecified site (HCC) -staples removed today -followup 3 months with CT abd/pelvis and cystoscopy - Bladder Voiding Trial - ciprofloxacin (CIPRO) tablet 500 mg   No follow-ups on file.  Wilkie Aye, MD  [facility] [place]

## 2022-11-24 NOTE — Progress Notes (Signed)
Fill and Pull Catheter Removal  Patient is present today for a catheter removal.  Patient was cleaned and prepped in a sterile fashion 60ml of sterile water/ saline was instilled into the bladder when the patient felt the urge to urinate. 10ml of water was then drained from the balloon.  Patient voided around the foley catheter. A 20FR foley cath was removed from the bladder no complications were noted .  Per Dr. Ronne Binning catheter was removed.  Patient tolerated well.  Performed by: Corneluis Allston LPN  Follow up/ Additional notes: per MD note

## 2022-11-24 NOTE — Progress Notes (Signed)
9 staples removed and steri stripped without difficulty from surgical sites.

## 2022-12-10 ENCOUNTER — Ambulatory Visit (INDEPENDENT_AMBULATORY_CARE_PROVIDER_SITE_OTHER): Payer: Medicare Other

## 2022-12-10 DIAGNOSIS — C652 Malignant neoplasm of left renal pelvis: Secondary | ICD-10-CM

## 2022-12-10 DIAGNOSIS — N2889 Other specified disorders of kidney and ureter: Secondary | ICD-10-CM

## 2022-12-10 NOTE — Progress Notes (Signed)
Patient in for staple removal. 11 staples removed and area steri stripped without difficulty.

## 2022-12-30 ENCOUNTER — Ambulatory Visit: Payer: Medicare Other

## 2023-01-03 ENCOUNTER — Ambulatory Visit: Payer: Medicare Other | Admitting: Gastroenterology

## 2023-01-03 ENCOUNTER — Encounter: Payer: Self-pay | Admitting: Gastroenterology

## 2023-01-03 VITALS — BP 100/56 | HR 95 | Ht 76.0 in | Wt 180.2 lb

## 2023-01-03 DIAGNOSIS — Z79899 Other long term (current) drug therapy: Secondary | ICD-10-CM | POA: Diagnosis not present

## 2023-01-03 DIAGNOSIS — K2270 Barrett's esophagus without dysplasia: Secondary | ICD-10-CM

## 2023-01-03 DIAGNOSIS — K2289 Other specified disease of esophagus: Secondary | ICD-10-CM | POA: Diagnosis not present

## 2023-01-03 DIAGNOSIS — Z860100 Personal history of colon polyps, unspecified: Secondary | ICD-10-CM

## 2023-01-03 DIAGNOSIS — Z7902 Long term (current) use of antithrombotics/antiplatelets: Secondary | ICD-10-CM

## 2023-01-03 DIAGNOSIS — K219 Gastro-esophageal reflux disease without esophagitis: Secondary | ICD-10-CM | POA: Diagnosis not present

## 2023-01-03 DIAGNOSIS — K5900 Constipation, unspecified: Secondary | ICD-10-CM

## 2023-01-03 MED ORDER — PLENVU 140 G PO SOLR: ORAL | 0 refills | Status: DC

## 2023-01-03 NOTE — Progress Notes (Signed)
HPI :  72 year old male here for follow-up visit for colon polyps, Barrett's and mucosal abnormality of the esophagus.  Recall he also has a history of bladder cancer, coronary artery disease on Plavix, tobacco use.  Recall that colonoscopy May 2019 showed 7 polyps removed, the largest was 1 cm in size.  Most adenomas and sessile serrated polyps.  He then had a colonoscopy with me in January in which 13 polyps removed, majority adenomas, sessile serrated polyps.  Random biopsies were also taken due to intermittent loose stools.  This showed no evidence of microscopic colitis.  He has no family history of colon cancer.  I had recommended referral to see genetic counselor after our last visit, he declined at the time but wanted to talk more with his family about it.  He is accompanied by his daughter today.  He has been having regular bowel movements, however sometimes it is harder to completely evacuate his rectum, even if the stool is soft.  He denies any blood in his stools.  He has been using MiraLAX to help with this.  Denies any routine narcotic use.   Recall he otherwise has had longstanding GERD since he was a child, has been on Protonix 40 mg for some time and generally works well for him.  Since his last visit somewhere along the line he had his Protonix increased to twice daily dosing.  He had an EGD with me in January 2022 showing a 2 cm segment of Barrett's esophagus without dysplasia.  He also was noted to have gastritis, and a mucosal abnormality of the esophagus at 35 cm from the incisors..  Pathology results as outlined below.  No evidence of H. 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 I last saw him in February 2023 about these issues.  We discussed doing an EGD last year for this and a colonoscopy 1 year after his last exam.  Unfortunately he was hospitalized with recurrent NSTEMI in November.  He had another stent placed in his heart.  On aspirin and  Plavix at this time.  He states since that time he is not having any issues with his heart.  He is feeling well without complaints, has occasional dyspnea at baseline but no chest pains.  He continues to take Protonix 40 mg twice daily.  States he denies any GERD.  No dysphagia.  He has since quit tobacco after longstanding use at the time of his last MI.  He has had surgery for bladder and renal cell cancer since his MI and tolerated that okay.    He last had an echocardiogram in January 2024 with an EF between 45 and 50%. He was seen by his cardiologist Dr. Ivan Croft on July 16 who cleared him for procedures and stated it was okay for him to hold Plavix for 5 days prior to procedure.     Prior work-up:  Colonoscopy 10/14/2017 -  - One 7 mm polyp in the cecum, removed with a cold snare. Resected and retrieved. - Two 8 to 10 mm polyps in the ascending colon, removed with a cold snare. Resected and retrieved. - One 5 mm polyp in the sigmoid colon, removed with a cold snare. Resected and retrieved. - Two 3 to 5 mm polyps in the sigmoid colon, removed with a cold snare. Resected and retrieved. - One 7 mm polyp in the rectum, removed with a hot snare. Resected and retrieved. - Internal hemorrhoids. - The examination was otherwise  normal. - Several minutes spent lavaging the colon to achieve adequate views   1. Surgical [P], rectosigmoid, sigmoid, ascending, and cecum, polyp (6) - TUBULAR ADENOMA. - MULTIPLE FRAGMENTS OF SESSILE SERRATED POLYP WITHOUT DYSPLASIA. - MULTIPLE FRAGMENTS OF HYPERPLASTIC POLYP. - NO HIGH GRADE DYSPLASIA OR MALIGNANCY. 2. Surgical [P], rectum, polyp - LIPOMA. - NO DYSPLASIA OR MALIGNANCY.     Echo 09/04/2018 - EF 45-50%   Cardiac cath 09/04/18 - 1.  Multivessel coronary artery disease with chronic occlusion of the apical LAD and mid circumflex, continued patency of the stented segment in the ramus intermedius, and severe progressive stenosis of the right PDA 2.   Mild segmental contraction abnormality the left ventricle with preserved overall LVEF estimated at 55 to 60% 3.  Normal LVEDP 4.  Successful balloon angioplasty of the right PDA, treated with p.o. BA because of small vessel caliber      EGD 06/05/2021 -  - A 2 cm hiatal hernia was present. - There were esophageal mucosal changes classified as Barrett's stage C0-M2 per Prague criteria present in the lower third of the esophagus (one 2 cm tongue and multiple small islands of salmon colored mucosa). The maximum longitudinal extent of these mucosal changes was 2 cm in length. Biopsies were taken with a cold forceps for histology. - A single white plaque was found in the lower third of the esophagus, 35 cm from the incisors, could not be lavaged off. Biopsies were taken with a cold forceps for histology. - The exam of the esophagus was otherwise normal. - Patchy moderate inflammation characterized by erosions, erythema and friability was found in the gastric antrum. - The exam of the stomach was otherwise normal. - Biopsies were taken with a cold forceps in the gastric body, at the incisura and in the gastric antrum for Helicobacter pylori testing. - The duodenal bulb and second portion of the duodenum were normal.     Colonoscopy 06/04/2021:Four 3 to 7 mm polyps in the ascending colon, removed with a cold snare. Resected and retrieved. - Two 5 to 10 mm polyps in the transverse colon, removed with a cold snare. Resected and retrieved. - Four 4 to 8 mm polyps in the descending colon, removed with a cold snare. Resected and retrieved. - Three 3 to 5 mm polyps in the sigmoid colon, removed with a cold snare. Resected and retrieved. - Diverticulosis in the left colon. - Internal hemorrhoids. - The examination was otherwise normal. - Biopsies were taken with a cold forceps for evaluation of microscopic colitis.   1. Surgical [P], gastric antrum and gastric body - GASTRIC OXYNTIC MUCOSA WITH  PARIETAL CELL HYPERPLASIA AS CAN BE SEEN IN HYPERGASTRINEMIC STATES SUCH AS PPI THERAPY. - GASTRIC ANTRAL MUCOSA WITH NO SPECIFIC HISTOPATHOLOGIC CHANGES - HELICOBACTER PYLORI-LIKE ORGANISMS ARE NOT IDENTIFIED ON ROUTINE H&E STAIN 2. Surgical [P], distal esophagus - BARRETTS ESOPHAGUS, NEGATIVE FOR DYSPLASIA 3. Surgical [P], distal esophagus at 35 cm ESOPHAGEAL MUCOSA WITH EPIDERMOID METAPLASIA. SEE NOTE 4. Surgical [P], colon, sigmoid, descending, transverse, and ascending, polyp (13) - TUBULAR ADENOMA(S) WITHOUT HIGH-GRADE DYSPLASIA OR MALIGNANCY - SESSILE SERRATED POLYP(S) WITHOUT CYTOLOGIC DYSPLASIA - HYPERPLASTIC POLYP(S) 5. Surgical [P], colon nos, random sites - COLONIC MUCOSA WITH NO SPECIFIC HISTOPATHOLOGIC CHANGES - NEGATIVE FOR ACUTE INFLAMMATION, INCREASED INTRAEPITHELIAL LYMPHOCYTES OR THICKENED SUBEPITHELIAL COLLAGEN TABLE   3. Esophageal squamous mucosa shows orthokeratosis and hyperkeratosis. Some studies have shown possible association of epidermoid metaplasia with squamous cell carcinoma.   Past Medical History:  Diagnosis Date  Acute viral pericarditis 09/03/2018   Inferior STE - but Negative Troponin.  CP &SOB.  Felt to be Viral.   Anemia    Arthritis    Bladder cancer (HCC) 2014   CAD (coronary artery disease)    Cataract    bilateral   Complication of anesthesia    Depression    Diabetes mellitus without complication (HCC)    Essential hypertension 01/20/2018   in the past no longer on medication   GERD (gastroesophageal reflux disease)    Heart attack (HCC) 03/2022   Hx of adenomatous colonic polyps 09/2017   colonoscopy   Hyperlipidemia with target LDL less than 70 12/19/2015   Intraoperative floppy iris syndrome (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 DES PCI RI, PTCA of RPDA 01/21/2018   12/2017 - Cath for ? STEMI -> distal/apical LAD & m-dCx CTO (unable to cross Cx).  Mod rPDA. Severe RI - DES PCI.  08/2018 -  Cath for ? Inf STEMI - RI stent patent & CTO dLAD/mCx. Progression of rPDA 95% -> PTCA only.  Thought to be Pericarditis & not MI (troponin negative).    Neuromuscular disorder (HCC)    Sleep apnea    BiPAP not currently being used   Status post tendon repair 1989   STEMI (ST elevation myocardial infarction) (HCC) 01/20/2018   Cardiac cath January 21, 2018: Severe multivessel disease with unclear lesion, but opted for PCI/DES x1 to the RI.  Appeared to have CTO of the distal LAD and circumflex as well as moderate mid LAD and diagonal disease as well as PDA.Marland Kitchen  Unable to cross circumflex lesion.   STEMI (ST elevation myocardial infarction) (HCC) 03/2022   Stroke (HCC) 11/2015   At times pt has dizziness with loss of vision   Tremors of nervous system 2011     Past Surgical History:  Procedure Laterality Date   APPENDECTOMY     BLADDER SURGERY     COLONOSCOPY     CORONARY STENT INTERVENTION N/A 01/21/2018   Procedure: CORONARY STENT INTERVENTION;  Surgeon: Marykay Lex, MD;  Location: [facility] INVASIVE CV LAB;  Service: Cardiovascular;  Laterality: N/A;  95% Ramus Intermedius -PCI with synergy DES 2.25 mm x 12 mm postdilated 2.4 mm.   CORONARY/GRAFT ACUTE MI REVASCULARIZATION N/A 01/21/2018   Procedure: Coronary/Graft Acute MI Revascularization;  Surgeon: Marykay Lex, MD;  Location: [facility] INVASIVE CV LAB;  Service: Cardiovascular;  Laterality: N/A;  attempted revas to distal CFX;    CORONARY/GRAFT ACUTE MI REVASCULARIZATION N/A 09/04/2018   Procedure: Coronary/Graft Acute MI Revascularization;  Surgeon: Tonny Bollman, MD;  Location: [facility] INVASIVE CV LAB:: PTCA/POBA of rPDA (2.0 mm balloon)   CYSTOSCOPY N/A 01/21/2020   Procedure: CYSTOSCOPY;  Surgeon: Malen Gauze, MD;  Location: [facility];  Service: Urology;  Laterality: N/A;   CYSTOSCOPY N/A 02/09/2021   Procedure: CYSTOSCOPY;  Surgeon: Malen Gauze, MD;  Location: [facility];  Service: Urology;  Laterality: N/A;   CYSTOSCOPY Left  11/08/2022   Procedure: CYSTOSCOPY;  Surgeon: Malen Gauze, MD;  Location: [facility];  Service: Urology;  Laterality: Left;   CYSTOSCOPY W/ RETROGRADES Bilateral 08/22/2015   Procedure: CYSTOSCOPY WITH RETROGRADE PYELOGRAM;  Surgeon: Bjorn Pippin, MD;  Location: [facility];  Service: Urology;  Laterality: Bilateral;   CYSTOSCOPY W/ RETROGRADES Bilateral 01/16/2016   Procedure: CYSTOSCOPY WITH RETROGRADE PYELOGRAM;  Surgeon: Bjorn Pippin, MD;  Location: [facility];  Service: Urology;  Laterality: Bilateral;  CYSTOSCOPY W/ RETROGRADES Bilateral 01/07/2017   Procedure: CYSTOSCOPY WITH RETROGRADE PYELOGRAM;  Surgeon: Bjorn Pippin, MD;  Location: [facility];  Service: Urology;  Laterality: Bilateral;   CYSTOSCOPY WITH BIOPSY N/A 08/22/2015   Procedure: CYSTOSCOPY WITH BLADDER BIOPSY;  Surgeon: Bjorn Pippin, MD;  Location: [facility];  Service: Urology;  Laterality: N/A;   CYSTOSCOPY WITH BIOPSY N/A 01/16/2016   Procedure: CYSTOSCOPY WITH BLADDER BIOPSY;  Surgeon: Bjorn Pippin, MD;  Location: [facility];  Service: Urology;  Laterality: N/A;   CYSTOSCOPY WITH FULGERATION N/A 01/16/2016   Procedure: CYSTOSCOPY WITH FULGERATION;  Surgeon: Bjorn Pippin, MD;  Location: [facility];  Service: Urology;  Laterality: N/A;   CYSTOSCOPY/RETROGRADE/URETEROSCOPY Left 09/06/2022   Procedure: CYSTOSCOPY/RETROGRADE/URETEROSCOPY/STENT PLACEMENT;  Surgeon: Malen Gauze, MD;  Location: [facility];  Service: Urology;  Laterality: Left;   KNEE ARTHROSCOPY Right 1989   LEFT HEART CATH AND CORONARY ANGIOGRAPHY N/A 01/21/2018   Procedure: LEFT HEART CATH AND CORONARY ANGIOGRAPHY;  Surgeon: Marykay Lex, MD;  Location: [facility] INVASIVE CV LAB;  Service: Cardiovascular;; 95% RI-DES PCI.  80% o-pCX (PTCA) -> dCX 80%-100% CTO (unsuccessful PTCA unable to cross distal lesion with balloon.)  100% CTO dLAD. RPDA 80% and 70% as well as P AV 180%, PA V2 60%.  (too small for PCI)   LEFT HEART CATH AND CORONARY ANGIOGRAPHY N/A 09/04/2018   Procedure: LEFT HEART CATH AND  CORONARY ANGIOGRAPHY;  Surgeon: Tonny Bollman, MD;  Location: [facility] INVASIVE CV LAB: (presumed Inferior STEMI) = progression of small rPDA -95% (PTCA only). Patent RI stent. CTO of apical LAD & mCx.   ROBOT ASSITED LAPAROSCOPIC NEPHROURETERECTOMY Left 11/08/2022   Procedure: XI ROBOT ASSITED LAPAROSCOPIC NEPHROURETERECTOMY;  Surgeon: Malen Gauze, MD;  Location: [facility];  Service: Urology;  Laterality: Left;   ROTATOR CUFF REPAIR Bilateral 2000   rt. leg fracture surgery     TRANSTHORACIC ECHOCARDIOGRAM  01/21/2018   Mild LVH.  EF 50 and 55%.  Apical inferior hypokinesis.  Mid-apical anterolateral hypokinesis.  Normal diastolic function for age    TRANSTHORACIC ECHOCARDIOGRAM  09/04/2018   -? Inf STEMI vs. Pericarditis:  Mildly reduced EF of 45 to 50%.  Impaired relaxation-GR 1 DD.  Severe hypokinesis of the anterolateral and inferolateral wall.  Small-moderate anterior pericardial effusion.  Moderate aortic sclerosis but no stenosis.   TRANSURETHRAL RESECTION OF BLADDER TUMOR N/A 01/07/2017   Procedure: TRANSURETHRAL RESECTION OF BLADDER TUMOR (TURBT);  Surgeon: Bjorn Pippin, MD;  Location: [facility];  Service: Urology;  Laterality: N/A;   TRANSURETHRAL RESECTION OF BLADDER TUMOR N/A 01/21/2020   Procedure: TRANSURETHRAL RESECTION OF BLADDER TUMOR (TURBT);  Surgeon: Malen Gauze, MD;  Location: [facility];  Service: Urology;  Laterality: N/A;   TRANSURETHRAL RESECTION OF BLADDER TUMOR N/A 02/09/2021   Procedure: TRANSURETHRAL RESECTION OF BLADDER TUMOR (TURBT);  Surgeon: Malen Gauze, MD;  Location: [facility];  Service: Urology;  Laterality: N/A;   URETERAL BIOPSY Left 09/06/2022   Procedure: URETERAL BIOPSY/fulgeration;  Surgeon: Malen Gauze, MD;  Location: [facility];  Service: Urology;  Laterality: Left;   WISDOM TOOTH EXTRACTION     Family History  Problem Relation Age of Onset   Diabetes Mother 89       type 1   Stroke Mother    Dementia Father    Diabetes Brother    Heart  attack Brother 58   Testicular cancer Brother    Diabetes Brother    Cancer Brother        testicular  Colon cancer Neg Hx    Colon polyps Neg Hx    Esophageal cancer Neg Hx    Rectal cancer Neg Hx    Stomach cancer Neg Hx    Pancreatic cancer Neg Hx    Social History   Tobacco Use   Smoking status: Former    Current packs/day: 1.00    Average packs/day: 1 pack/day for 50.0 years (50.0 ttl pk-yrs)    Types: Cigarettes   Smokeless tobacco: Former  Building services engineer status: Never Used  Substance Use Topics   Alcohol use: No    Comment: rarely   Drug use: No   Current Outpatient Medications  Medication Sig Dispense Refill   aspirin EC 81 MG tablet Take 81 mg by mouth daily. Swallow whole.     Carboxymethylcellul-Glycerin (LUBRICATING EYE DROPS OP) Place 1 drop into both eyes daily as needed (dry eyes).     Cholecalciferol (VITAMIN D) 50 MCG (2000 UT) tablet Take 2,000 Units by mouth 2 (two) times daily.     clopidogrel (PLAVIX) 75 MG tablet Take 75 mg by mouth daily.     diclofenac Sodium (VOLTAREN) 1 % GEL Apply 1 Application topically 4 (four) times daily as needed (pain).     empagliflozin (JARDIANCE) 25 MG TABS tablet Take 25 mg by mouth daily. 90 mg 3   fenofibrate (TRICOR) 48 MG tablet Take 1 tablet (48 mg total) by mouth daily. 90 tablet 3   furosemide (LASIX) 40 MG tablet Take 20 mg by mouth daily.     glimepiride (AMARYL) 2 MG tablet Take 2 mg by mouth daily before breakfast.     insulin degludec (TRESIBA FLEXTOUCH) 100 UNIT/ML FlexTouch Pen Inject 14 Units into the skin daily.     isosorbide mononitrate (IMDUR) 30 MG 24 hr tablet Take 1 tablet (30 mg total) by mouth daily. (Patient taking differently: Take 15 mg by mouth every evening.) 90 tablet 3   levothyroxine (SYNTHROID) 50 MCG tablet Take 50 mcg by mouth daily before breakfast.     metFORMIN (GLUCOPHAGE) 1000 MG tablet Take 1 tablet by mouth 2 times daily with a meal. (Patient taking differently: Take 1,000 mg  by mouth 2 (two) times daily with a meal.) 180 tablet 3   metoprolol succinate (TOPROL-XL) 25 MG 24 hr tablet Take 25 mg by mouth daily.     Multiple Vitamins-Minerals (PRESERVISION AREDS 2) CAPS Take 1 capsule by mouth 2 (two) times daily.     nitroGLYCERIN (NITROSTAT) 0.4 MG SL tablet PLACE 1 TABLET UNDER THE TONGUE AT ONSET OF CHEST PAIN EVERY 5 MINTUES UP TO 3 TIMES AS NEEDED (Patient taking differently: Place 0.4 mg under the tongue every 5 (five) minutes as needed for chest pain.) 25 tablet 0   ONETOUCH VERIO test strip daily.     oxyCODONE-acetaminophen (PERCOCET) 5-325 MG tablet Take 1 tablet by mouth every 4 (four) hours as needed for severe pain. 15 tablet 0   pantoprazole (PROTONIX) 40 MG tablet Take 1 tablet (40 mg total) by mouth 2 (two) times daily. Please keep your June office visit for further refills. Thank you 60 tablet 1   primidone (MYSOLINE) 50 MG tablet Take 1 tablet (50 mg total) by mouth 4 (four) times daily. (Patient taking differently: Take 100 mg by mouth 4 (four) times daily.) 360 tablet 3   ranolazine (RANEXA) 500 MG 12 hr tablet Take 500 mg by mouth 2 (two) times daily.     rosuvastatin (CRESTOR) 40 MG tablet  Take 1 tablet (40 mg total) by mouth daily. 90 tablet 3   tamsulosin (FLOMAX) 0.4 MG CAPS capsule Take 1 capsule (0.4 mg total) by mouth daily. 90 capsule 3   No current facility-administered medications for this visit.   Allergies  Allergen Reactions   Tramadol Shortness Of Breath    And dizziness   Benadryl [Diphenhydramine] Other (See Comments)    Patient was "knocked out for 3 days after surgery" patient received benadryl for severe itching after surgery   Betadine [Povidone Iodine] Itching    Patient had severe itching after surgery in the area betadine was used   Trulicity [Dulaglutide] Swelling    Ankles and feet swell     Review of Systems: All systems reviewed and negative except where noted in HPI.   Lab Results  Component Value Date   WBC  8.5 11/09/2022   HGB 11.9 (L) 11/09/2022   HCT 35.9 (L) 11/09/2022   MCV 98.1 11/09/2022   PLT 166 11/09/2022    Lab Results  Component Value Date   NA 137 11/09/2022   CL 104 11/09/2022   K 4.0 11/09/2022   CO2 24 11/09/2022   BUN 17 11/09/2022   CREATININE 1.10 11/09/2022   GFRNONAA >60 11/09/2022   CALCIUM 8.2 (L) 11/09/2022   ALBUMIN 4.7 11/19/2019   GLUCOSE 178 (H) 11/09/2022    Lab Results  Component Value Date   ALT 10 11/19/2019   AST 9 11/19/2019   ALKPHOS 66 11/19/2019   BILITOT 0.2 11/19/2019     Physical Exam: BP (!) 100/56   Pulse 95   Ht 6\' 4"  (1.93 m)   Wt 180 lb 4 oz (81.8 kg)   BMI 21.94 kg/m  Constitutional: Pleasant,well-developed, male in no acute distress. Neurological: Alert and oriented to person place and time. Psychiatric: Normal mood and affect. Behavior is normal.   ASSESSMENT: 72 y.o. male here for assessment of the following  1. Barrett's esophagus without dysplasia   2. Mucosal abnormality of esophagus   3. Gastroesophageal reflux disease, unspecified whether esophagitis present   4. Long-term current use of proton pump inhibitor therapy   5. Constipation, unspecified constipation type   6. History of colon polyps   7. Antiplatelet or antithrombotic long-term use    As above, short segment of Barrett's esophagus on last EGD along with mucosal plaque consistent with epidermoid metaplasia.  He understands increased risk of esophageal cancer with Barrett's esophagus, that has also been linked with epidermoid metaplasia (SCC).  I offered him an EGD for interval surveillance given the plaque lesion on his last exam.  At the same time, recommend colonoscopy for surveillance purposes if he thinks he can handle it, given numerous polyps on the last exam.  I discussed risks of these exams and anesthesia, he wishes to have another exam before considering holding off on further surveillance given his comorbidities.  He is doing pretty well  since he had coronary stenting last November.  Seen recently by his cardiologist and cleared for procedures and to hold Plavix for these exams.  Following discussion of the exams he is agreeable to this.  He will hold Plavix for 5 days but I recommend he continue aspirin throughout the periprocedure timeframe.  Otherwise, we discussed his altered bowel habits, recommend he add Citrucel once daily to his bowel regimen along with the MiraLAX to see if we can help with his sense of incomplete evacuation.  Additionally, discussed his personal history of cancer and numerous polyps  removed in the past, offered him genetic testing, results may influence his family members who are at the visit with him today.  He wants to proceed with this if covered by insurance.  Referral placed.  Fortunately has quit tobacco since his last exam   PLAN: - schedule EGD and colonoscopy at the [facility] - cleared per cardiology to hold plavix 5 days, can continue aspirin throughout periprocedure time frame - continue protonix  - continue Miralax  - add Citrucel daily - referral to genetic counselor given burden of polyps removed in the past and personal history of cancer   Harlin Rain, MD [facility]astro[facility]

## 2023-01-03 NOTE — Patient Instructions (Addendum)
You have been scheduled for an endoscopy and colonoscopy. Please follow the written instructions given to you at your visit today.  Please pick up your prep supplies at the pharmacy within the next 1-3 days.  If you use inhalers (even only as needed), please bring them with you on the day of your procedure.  DO NOT TAKE 7 DAYS PRIOR TO TEST- Trulicity (dulaglutide) Ozempic, Wegovy (semaglutide) Mounjaro (tirzepatide) Bydureon Bcise (exanatide extended release)  DO NOT TAKE 1 DAY PRIOR TO YOUR TEST Rybelsus (semaglutide) Adlyxin (lixisenatide) Victoza (liraglutide) Byetta (exanatide) ___________________________________________________________________________    Continue your aspirin while holding your Plavix 5 days prior to your procedure starting on Wednesdays 02/16/23.   Continue Miralax   Please purchase the following medications over the counter and take as directed: Citrucel take once daily.  We are referring you to Genetic counseling.  They will contact you directly to schedule an appointment.  It may take a week or more before you hear from them.  Please feel free to contact us if you have not heard from them within 2 weeks and we will follow up on the referral.

## 2023-01-05 ENCOUNTER — Ambulatory Visit: Payer: Medicare Other | Attending: Urology

## 2023-01-05 ENCOUNTER — Telehealth: Payer: Self-pay | Admitting: Genetic Counselor

## 2023-01-05 ENCOUNTER — Other Ambulatory Visit: Payer: Self-pay

## 2023-01-05 DIAGNOSIS — R2681 Unsteadiness on feet: Secondary | ICD-10-CM | POA: Diagnosis present

## 2023-01-05 DIAGNOSIS — M6281 Muscle weakness (generalized): Secondary | ICD-10-CM | POA: Insufficient documentation

## 2023-01-05 DIAGNOSIS — N2889 Other specified disorders of kidney and ureter: Secondary | ICD-10-CM | POA: Insufficient documentation

## 2023-01-05 NOTE — Telephone Encounter (Signed)
Patient's daughter is aware of scheduled appointment times/dates for [facility]

## 2023-01-05 NOTE — Therapy (Signed)
OUTPATIENT PHYSICAL THERAPY LOWER EXTREMITY EVALUATION   Patient Name: Andrew [name] MRN: [id number] DOB:Feb 01, 1951, 72 y.o., male Today's Date: 01/05/2023  END OF SESSION:  PT End of Session - 01/05/23 1304     Visit Number 1    Number of Visits 12    Date for PT Re-Evaluation 03/18/23    PT Start Time 1305    PT Stop Time 1343    PT Time Calculation (min) 38 min    Activity Tolerance Patient tolerated treatment well    Behavior During Therapy [facility] for tasks assessed/performed             Past Medical History:  Diagnosis Date   Acute viral pericarditis 09/03/2018   Inferior STE - but Negative Troponin.  CP &SOB.  Felt to be Viral.   Anemia    Arthritis    Bladder cancer (HCC) 2014   CAD (coronary artery disease)    Cataract    bilateral   Complication of anesthesia    Depression    Diabetes mellitus without complication (HCC)    Essential hypertension 01/20/2018   in the past no longer on medication   GERD (gastroesophageal reflux disease)    Heart attack (HCC) 03/2022   Hx of adenomatous colonic polyps 09/2017   colonoscopy   Hyperlipidemia with target LDL less than 70 12/19/2015   Intraoperative floppy iris syndrome (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 DES PCI RI, PTCA of RPDA 01/21/2018   12/2017 - Cath for ? STEMI -> distal/apical LAD & m-dCx CTO (unable to cross Cx).  Mod rPDA. Severe RI - DES PCI.  08/2018 - Cath for ? Inf STEMI - RI stent patent & CTO dLAD/mCx. Progression of rPDA 95% -> PTCA only.  Thought to be Pericarditis & not MI (troponin negative).    Neuromuscular disorder (HCC)    Sleep apnea    BiPAP not currently being used   Status post tendon repair 1989   STEMI (ST elevation myocardial infarction) (HCC) 01/20/2018   Cardiac cath January 21, 2018: Severe multivessel disease with unclear lesion, but opted for PCI/DES x1 to the RI.  Appeared to have CTO of the distal LAD and circumflex as well as moderate  mid LAD and diagonal disease as well as PDA.Marland Kitchen  Unable to cross circumflex lesion.   STEMI (ST elevation myocardial infarction) (HCC) 03/2022   Stroke (HCC) 11/2015   At times pt has dizziness with loss of vision   Tremors of nervous system 2011   Past Surgical History:  Procedure Laterality Date   APPENDECTOMY     BLADDER SURGERY     COLONOSCOPY     CORONARY STENT INTERVENTION N/A 01/21/2018   Procedure: CORONARY STENT INTERVENTION;  Surgeon: Marykay Lex, MD;  Location: [facility] INVASIVE CV LAB;  Service: Cardiovascular;  Laterality: N/A;  95% Ramus Intermedius -PCI with synergy DES 2.25 mm x 12 mm postdilated 2.4 mm.   CORONARY/GRAFT ACUTE MI REVASCULARIZATION N/A 01/21/2018   Procedure: Coronary/Graft Acute MI Revascularization;  Surgeon: Marykay Lex, MD;  Location: [facility] INVASIVE CV LAB;  Service: Cardiovascular;  Laterality: N/A;  attempted revas to distal CFX;    CORONARY/GRAFT ACUTE MI REVASCULARIZATION N/A 09/04/2018   Procedure: Coronary/Graft Acute MI Revascularization;  Surgeon: Tonny Bollman, MD;  Location: [facility] INVASIVE CV LAB:: PTCA/POBA of rPDA (2.0 mm balloon)   CYSTOSCOPY N/A 01/21/2020   Procedure: CYSTOSCOPY;  Surgeon: Malen Gauze, MD;  Location: [facility];  Service: Urology;  Laterality: N/A;   CYSTOSCOPY N/A 02/09/2021   Procedure: CYSTOSCOPY;  Surgeon: Malen Gauze, MD;  Location: [facility];  Service: Urology;  Laterality: N/A;   CYSTOSCOPY Left 11/08/2022   Procedure: CYSTOSCOPY;  Surgeon: Malen Gauze, MD;  Location: [facility];  Service: Urology;  Laterality: Left;   CYSTOSCOPY W/ RETROGRADES Bilateral 08/22/2015   Procedure: CYSTOSCOPY WITH RETROGRADE PYELOGRAM;  Surgeon: Bjorn Pippin, MD;  Location: [facility];  Service: Urology;  Laterality: Bilateral;   CYSTOSCOPY W/ RETROGRADES Bilateral 01/16/2016   Procedure: CYSTOSCOPY WITH RETROGRADE PYELOGRAM;  Surgeon: Bjorn Pippin, MD;  Location: [facility];  Service: Urology;  Laterality: Bilateral;   CYSTOSCOPY W/ RETROGRADES  Bilateral 01/07/2017   Procedure: CYSTOSCOPY WITH RETROGRADE PYELOGRAM;  Surgeon: Bjorn Pippin, MD;  Location: [facility];  Service: Urology;  Laterality: Bilateral;   CYSTOSCOPY WITH BIOPSY N/A 08/22/2015   Procedure: CYSTOSCOPY WITH BLADDER BIOPSY;  Surgeon: Bjorn Pippin, MD;  Location: [facility];  Service: Urology;  Laterality: N/A;   CYSTOSCOPY WITH BIOPSY N/A 01/16/2016   Procedure: CYSTOSCOPY WITH BLADDER BIOPSY;  Surgeon: Bjorn Pippin, MD;  Location: [facility];  Service: Urology;  Laterality: N/A;   CYSTOSCOPY WITH FULGERATION N/A 01/16/2016   Procedure: CYSTOSCOPY WITH FULGERATION;  Surgeon: Bjorn Pippin, MD;  Location: [facility];  Service: Urology;  Laterality: N/A;   CYSTOSCOPY/RETROGRADE/URETEROSCOPY Left 09/06/2022   Procedure: CYSTOSCOPY/RETROGRADE/URETEROSCOPY/STENT PLACEMENT;  Surgeon: Malen Gauze, MD;  Location: [facility];  Service: Urology;  Laterality: Left;   KNEE ARTHROSCOPY Right 1989   LEFT HEART CATH AND CORONARY ANGIOGRAPHY N/A 01/21/2018   Procedure: LEFT HEART CATH AND CORONARY ANGIOGRAPHY;  Surgeon: Marykay Lex, MD;  Location: [facility] INVASIVE CV LAB;  Service: Cardiovascular;; 95% RI-DES PCI.  80% o-pCX (PTCA) -> dCX 80%-100% CTO (unsuccessful PTCA unable to cross distal lesion with balloon.)  100% CTO dLAD. RPDA 80% and 70% as well as P AV 180%, PA V2 60%.  (too small for PCI)   LEFT HEART CATH AND CORONARY ANGIOGRAPHY N/A 09/04/2018   Procedure: LEFT HEART CATH AND CORONARY ANGIOGRAPHY;  Surgeon: Tonny Bollman, MD;  Location: [facility] INVASIVE CV LAB: (presumed Inferior STEMI) = progression of small rPDA -95% (PTCA only). Patent RI stent. CTO of apical LAD & mCx.   ROBOT ASSITED LAPAROSCOPIC NEPHROURETERECTOMY Left 11/08/2022   Procedure: XI ROBOT ASSITED LAPAROSCOPIC NEPHROURETERECTOMY;  Surgeon: Malen Gauze, MD;  Location: [facility];  Service: Urology;  Laterality: Left;   ROTATOR CUFF REPAIR Bilateral 2000   rt. leg fracture surgery     TRANSTHORACIC ECHOCARDIOGRAM  01/21/2018   Mild  LVH.  EF 50 and 55%.  Apical inferior hypokinesis.  Mid-apical anterolateral hypokinesis.  Normal diastolic function for age    TRANSTHORACIC ECHOCARDIOGRAM  09/04/2018   -? Inf STEMI vs. Pericarditis:  Mildly reduced EF of 45 to 50%.  Impaired relaxation-GR 1 DD.  Severe hypokinesis of the anterolateral and inferolateral wall.  Small-moderate anterior pericardial effusion.  Moderate aortic sclerosis but no stenosis.   TRANSURETHRAL RESECTION OF BLADDER TUMOR N/A 01/07/2017   Procedure: TRANSURETHRAL RESECTION OF BLADDER TUMOR (TURBT);  Surgeon: Bjorn Pippin, MD;  Location: [facility];  Service: Urology;  Laterality: N/A;   TRANSURETHRAL RESECTION OF BLADDER TUMOR N/A 01/21/2020   Procedure: TRANSURETHRAL RESECTION OF BLADDER TUMOR (TURBT);  Surgeon: Malen Gauze, MD;  Location: [facility];  Service: Urology;  Laterality: N/A;   TRANSURETHRAL RESECTION OF BLADDER TUMOR N/A 02/09/2021   Procedure: TRANSURETHRAL RESECTION OF BLADDER TUMOR (TURBT);  Surgeon: Ronne Binning,  Mardene Celeste, MD;  Location: [facility];  Service: Urology;  Laterality: N/A;   URETERAL BIOPSY Left 09/06/2022   Procedure: URETERAL BIOPSY/fulgeration;  Surgeon: Malen Gauze, MD;  Location: [facility];  Service: Urology;  Laterality: Left;   WISDOM TOOTH EXTRACTION     Patient Active Problem List   Diagnosis Date Noted   Ureteral cancer, 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 DES PCI RI, PTCA of RPDA 01/21/2018   Stable angina 01/20/2018   Depression 01/20/2018   Essential hypertension 01/20/2018   Sleep apnea 10/06/2016   Tobacco use disorder 12/19/2015   Type 2 diabetes mellitus with circulatory disorder, without long-term  current use of insulin (HCC) 12/19/2015   Hyperlipidemia with target LDL less than 70 12/19/2015   Bladder cancer (HCC) 12/19/2015   Esophageal reflux 12/19/2015   Essential tremor 12/19/2015   History of stroke 12/19/2015    PCP: Stevphen Rochester, MD  REFERRING PROVIDER: Malen Gauze, MD   REFERRING DIAG: Left renal mass; Weakness   THERAPY DIAG:  Muscle weakness (generalized)  Unsteadiness on feet  Rationale for Evaluation and Treatment: Rehabilitation  ONSET DATE: 4 years ago   SUBJECTIVE:   SUBJECTIVE STATEMENT: Patient reports that he began having problems about 4 years after his first heart attack. He was initially recommend therapy after this heart attack, but they told him that he had to go to [place] or [place]. However, he was unable to go this far and did not go to therapy. He had another heart attack in November 2023 and then had surgery on his kidneys on 11/08/22. He notes that he just got his staples out a few weeks ago. He reports that he can get dizzy at times when standing up from a chair.   PERTINENT HISTORY: Diabetes type 2, angina, hypertension, COPD, essential tremor, history of cancer, history of CVA, history of MI, depression, and arthritis PAIN:  Are you having pain? Yes: NPRS scale: 3-4/10 Pain location: low back and both legs  Pain description: aching Aggravating factors: prolonged standing and sitting (5 minutes)  Relieving factors: medication  PRECAUTIONS: Fall  RED FLAGS: None   WEIGHT BEARING RESTRICTIONS: No  FALLS:  Has patient fallen in last 6 months? Yes. Number of falls 3-4; one was in December 2023 when he fell and hit his head when he tripped in his bedroom   LIVING ENVIRONMENT: Lives with: lives alone Lives in: House/apartment Stairs: No Has following equipment at home: Ramped entry  OCCUPATION: retired  PLOF: Independent with basic ADLs  PATIENT GOALS: improved strength, balance, and be able to pick up  sticks in his yard  NEXT MD VISIT: 03/02/23  OBJECTIVE:   COGNITION: Overall cognitive status: Within functional limits for tasks assessed     SENSATION: Light touch: Impaired with diminished sensation in both lower extremities with no dermatomal pattern Patient reports numbness in both lower extremities  EDEMA:  No edema observed  POSTURE: rounded shoulders, forward head, and flexed trunk   LOWER EXTREMITY ROM: WFL for activities assessed  LOWER EXTREMITY MMT:  MMT Right eval Left eval  Hip flexion 4-/5 4-/5  Hip extension    Hip abduction    Hip  adduction    Hip internal rotation    Hip external rotation    Knee flexion 4/5 4/5  Knee extension 3+/5 4+/5  Ankle dorsiflexion 3/5 3/5  Ankle plantarflexion    Ankle inversion    Ankle eversion     (Blank rows = not tested)  FUNCTIONAL TESTS:  5 times sit to stand: 1:08.69 seconds with significant upper extremity support from the armrests Timed up and go (TUG): 22.54 seconds 30 seconds sit to stand test: 2 repetitions   GAIT: Assistive device utilized: None Level of assistance: Complete Independence Comments: Flexed trunk with decreased stride length and poor foot clearance bilaterally   TODAY'S TREATMENT:                                                                                                                              DATE:     PATIENT EDUCATION:  Education details: Plan of care, healing, prognosis, and goals for therapy Person educated: Patient Education method: Explanation Education comprehension: verbalized understanding  HOME EXERCISE PROGRAM:   ASSESSMENT:  CLINICAL IMPRESSION: Patient is a 72 y.o. male who was seen today for physical therapy evaluation and treatment for deconditioning and a history of falling.  He is at a high fall risk as evidenced by his gait pattern, objective measures, and his history of falling.  He also exhibited significant difficulty with sit to stand transfers as  he required maximal upper extremity assistance from armrests to safely transfer to standing.  Recommend that he continue with skilled physical therapy to address his impairments to maximize his safety and functional mobility.  OBJECTIVE IMPAIRMENTS: Abnormal gait, decreased activity tolerance, decreased balance, decreased mobility, difficulty walking, decreased strength, impaired sensation, postural dysfunction, and pain.   ACTIVITY LIMITATIONS: carrying, lifting, standing, stairs, transfers, and locomotion level  PARTICIPATION LIMITATIONS: cleaning, laundry, shopping, community activity, and yard work  PERSONAL FACTORS: Age, Past/current experiences, Time since onset of injury/illness/exacerbation, and 3+ comorbidities: Diabetes type 2, angina, hypertension, COPD, essential tremor, history of cancer, history of CVA, history of MI, depression, and arthritis  are also affecting patient's functional outcome.   REHAB POTENTIAL: Good  CLINICAL DECISION MAKING: Evolving/moderate complexity  EVALUATION COMPLEXITY: Moderate   GOALS: Goals reviewed with patient? Yes  SHORT TERM GOALS: Target date: 01/26/23 Patient will be independent with his initial HEP. Baseline: Goal status: INITIAL  2.  Patient will improve his 5 times sit to stand time to 45 seconds or less for improved lower extremity power. Baseline:  Goal status: INITIAL  3.  Patient will improve his timed up and go time to 16 seconds or less for improved functional mobility. Baseline:  Goal status: INITIAL  LONG TERM GOALS: Target date: 02/16/23  Patient will be independent with his advanced HEP. Baseline:  Goal status: INITIAL  2.  Patient will be able to safely pick up items from the floor for improved household independence. Baseline:  Goal status: INITIAL  3.  Patient will  improve his 5 times sit to stand time to 35 seconds or less for improved lower extremity power. Baseline:  Goal status: INITIAL  4.  Patient will  improve his timed up and go time to 12 seconds or less to reduce his fall risk. Baseline:  Goal status: INITIAL  5.  Patient will be able to safely transfer from sitting to standing with minimal to no upper extremity support. Baseline:  Goal status: INITIAL  PLAN:  PT FREQUENCY: 2x/week  PT DURATION: 6 weeks  PLANNED INTERVENTIONS: Therapeutic exercises, Therapeutic activity, Neuromuscular re-education, Balance training, Gait training, Patient/Family education, Self Care, Stair training, and Re-evaluation  PLAN FOR NEXT SESSION: NuStep, lower extremity strengthening, and balance interventions  Granville Lewis, PT 01/05/2023, 3:21 PM

## 2023-01-11 ENCOUNTER — Encounter: Payer: Self-pay | Admitting: *Deleted

## 2023-01-11 ENCOUNTER — Ambulatory Visit: Payer: Medicare Other | Admitting: *Deleted

## 2023-01-11 DIAGNOSIS — M6281 Muscle weakness (generalized): Secondary | ICD-10-CM

## 2023-01-11 DIAGNOSIS — R2681 Unsteadiness on feet: Secondary | ICD-10-CM

## 2023-01-11 NOTE — Therapy (Signed)
OUTPATIENT PHYSICAL THERAPY LOWER EXTREMITY EVALUATION   Patient Name: Andrew [name] MRN: [id number] DOB:June 21, 1950, 72 y.o., male Today's Date: 01/11/2023  END OF SESSION:  PT End of Session - 01/11/23 1509     Visit Number 2    Number of Visits 12    Date for PT Re-Evaluation 03/18/23    PT Start Time 1515    PT Stop Time 1603    PT Time Calculation (min) 48 min             Past Medical History:  Diagnosis Date   Acute viral pericarditis 09/03/2018   Inferior STE - but Negative Troponin.  CP &SOB.  Felt to be Viral.   Anemia    Arthritis    Bladder cancer (HCC) 2014   CAD (coronary artery disease)    Cataract    bilateral   Complication of anesthesia    Depression    Diabetes mellitus without complication (HCC)    Essential hypertension 01/20/2018   in the past no longer on medication   GERD (gastroesophageal reflux disease)    Heart attack (HCC) 03/2022   Hx of adenomatous colonic polyps 09/2017   colonoscopy   Hyperlipidemia with target LDL less than 70 12/19/2015   Intraoperative floppy iris syndrome (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 DES PCI RI, PTCA of RPDA 01/21/2018   12/2017 - Cath for ? STEMI -> distal/apical LAD & m-dCx CTO (unable to cross Cx).  Mod rPDA. Severe RI - DES PCI.  08/2018 - Cath for ? Inf STEMI - RI stent patent & CTO dLAD/mCx. Progression of rPDA 95% -> PTCA only.  Thought to be Pericarditis & not MI (troponin negative).    Neuromuscular disorder (HCC)    Sleep apnea    BiPAP not currently being used   Status post tendon repair 1989   STEMI (ST elevation myocardial infarction) (HCC) 01/20/2018   Cardiac cath January 21, 2018: Severe multivessel disease with unclear lesion, but opted for PCI/DES x1 to the RI.  Appeared to have CTO of the distal LAD and circumflex as well as moderate mid LAD and diagonal disease as well as PDA.Marland Kitchen  Unable to cross circumflex lesion.   STEMI (ST elevation myocardial  infarction) (HCC) 03/2022   Stroke (HCC) 11/2015   At times pt has dizziness with loss of vision   Tremors of nervous system 2011   Past Surgical History:  Procedure Laterality Date   APPENDECTOMY     BLADDER SURGERY     COLONOSCOPY     CORONARY STENT INTERVENTION N/A 01/21/2018   Procedure: CORONARY STENT INTERVENTION;  Surgeon: Marykay Lex, MD;  Location: [facility] INVASIVE CV LAB;  Service: Cardiovascular;  Laterality: N/A;  95% Ramus Intermedius -PCI with synergy DES 2.25 mm x 12 mm postdilated 2.4 mm.   CORONARY/GRAFT ACUTE MI REVASCULARIZATION N/A 01/21/2018   Procedure: Coronary/Graft Acute MI Revascularization;  Surgeon: Marykay Lex, MD;  Location: [facility] INVASIVE CV LAB;  Service: Cardiovascular;  Laterality: N/A;  attempted revas to distal CFX;    CORONARY/GRAFT ACUTE MI REVASCULARIZATION N/A 09/04/2018   Procedure: Coronary/Graft Acute MI Revascularization;  Surgeon: Tonny Bollman, MD;  Location: [facility] INVASIVE CV LAB:: PTCA/POBA of rPDA (2.0 mm balloon)   CYSTOSCOPY N/A 01/21/2020   Procedure: CYSTOSCOPY;  Surgeon: Malen Gauze, MD;  Location: [facility];  Service: Urology;  Laterality: N/A;   CYSTOSCOPY N/A 02/09/2021   Procedure: CYSTOSCOPY;  Surgeon: Wilkie Aye  L, MD;  Location: [facility];  Service: Urology;  Laterality: N/A;   CYSTOSCOPY Left 11/08/2022   Procedure: CYSTOSCOPY;  Surgeon: Malen Gauze, MD;  Location: [facility];  Service: Urology;  Laterality: Left;   CYSTOSCOPY W/ RETROGRADES Bilateral 08/22/2015   Procedure: CYSTOSCOPY WITH RETROGRADE PYELOGRAM;  Surgeon: Bjorn Pippin, MD;  Location: [facility];  Service: Urology;  Laterality: Bilateral;   CYSTOSCOPY W/ RETROGRADES Bilateral 01/16/2016   Procedure: CYSTOSCOPY WITH RETROGRADE PYELOGRAM;  Surgeon: Bjorn Pippin, MD;  Location: [facility];  Service: Urology;  Laterality: Bilateral;   CYSTOSCOPY W/ RETROGRADES Bilateral 01/07/2017   Procedure: CYSTOSCOPY WITH RETROGRADE PYELOGRAM;  Surgeon: Bjorn Pippin, MD;  Location: [facility];  Service: Urology;  Laterality: Bilateral;   CYSTOSCOPY WITH BIOPSY N/A 08/22/2015   Procedure: CYSTOSCOPY WITH BLADDER BIOPSY;  Surgeon: Bjorn Pippin, MD;  Location: [facility];  Service: Urology;  Laterality: N/A;   CYSTOSCOPY WITH BIOPSY N/A 01/16/2016   Procedure: CYSTOSCOPY WITH BLADDER BIOPSY;  Surgeon: Bjorn Pippin, MD;  Location: [facility];  Service: Urology;  Laterality: N/A;   CYSTOSCOPY WITH FULGERATION N/A 01/16/2016   Procedure: CYSTOSCOPY WITH FULGERATION;  Surgeon: Bjorn Pippin, MD;  Location: [facility];  Service: Urology;  Laterality: N/A;   CYSTOSCOPY/RETROGRADE/URETEROSCOPY Left 09/06/2022   Procedure: CYSTOSCOPY/RETROGRADE/URETEROSCOPY/STENT PLACEMENT;  Surgeon: Malen Gauze, MD;  Location: [facility];  Service: Urology;  Laterality: Left;   KNEE ARTHROSCOPY Right 1989   LEFT HEART CATH AND CORONARY ANGIOGRAPHY N/A 01/21/2018   Procedure: LEFT HEART CATH AND CORONARY ANGIOGRAPHY;  Surgeon: Marykay Lex, MD;  Location: [facility] INVASIVE CV LAB;  Service: Cardiovascular;; 95% RI-DES PCI.  80% o-pCX (PTCA) -> dCX 80%-100% CTO (unsuccessful PTCA unable to cross distal lesion with balloon.)  100% CTO dLAD. RPDA 80% and 70% as well as P AV 180%, PA V2 60%.  (too small for PCI)   LEFT HEART CATH AND CORONARY ANGIOGRAPHY N/A 09/04/2018   Procedure: LEFT HEART CATH AND CORONARY ANGIOGRAPHY;  Surgeon: Tonny Bollman, MD;  Location: [facility] INVASIVE CV LAB: (presumed Inferior STEMI) = progression of small rPDA -95% (PTCA only). Patent RI stent. CTO of apical LAD & mCx.   ROBOT ASSITED LAPAROSCOPIC NEPHROURETERECTOMY Left 11/08/2022   Procedure: XI ROBOT ASSITED LAPAROSCOPIC NEPHROURETERECTOMY;  Surgeon: Malen Gauze, MD;  Location: [facility];  Service: Urology;  Laterality: Left;   ROTATOR CUFF REPAIR Bilateral 2000   rt. leg fracture surgery     TRANSTHORACIC ECHOCARDIOGRAM  01/21/2018   Mild LVH.  EF 50 and 55%.  Apical inferior hypokinesis.  Mid-apical anterolateral hypokinesis.  Normal diastolic  function for age    TRANSTHORACIC ECHOCARDIOGRAM  09/04/2018   -? Inf STEMI vs. Pericarditis:  Mildly reduced EF of 45 to 50%.  Impaired relaxation-GR 1 DD.  Severe hypokinesis of the anterolateral and inferolateral wall.  Small-moderate anterior pericardial effusion.  Moderate aortic sclerosis but no stenosis.   TRANSURETHRAL RESECTION OF BLADDER TUMOR N/A 01/07/2017   Procedure: TRANSURETHRAL RESECTION OF BLADDER TUMOR (TURBT);  Surgeon: Bjorn Pippin, MD;  Location: [facility];  Service: Urology;  Laterality: N/A;   TRANSURETHRAL RESECTION OF BLADDER TUMOR N/A 01/21/2020   Procedure: TRANSURETHRAL RESECTION OF BLADDER TUMOR (TURBT);  Surgeon: Malen Gauze, MD;  Location: [facility];  Service: Urology;  Laterality: N/A;   TRANSURETHRAL RESECTION OF BLADDER TUMOR N/A 02/09/2021   Procedure: TRANSURETHRAL RESECTION OF BLADDER TUMOR (TURBT);  Surgeon: Malen Gauze, MD;  Location: [facility];  Service: Urology;  Laterality: N/A;   URETERAL BIOPSY Left  09/06/2022   Procedure: URETERAL BIOPSY/fulgeration;  Surgeon: Malen Gauze, MD;  Location: [facility];  Service: Urology;  Laterality: Left;   WISDOM TOOTH EXTRACTION     Patient Active Problem List   Diagnosis Date Noted   Ureteral cancer, 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 DES PCI RI, PTCA of RPDA 01/21/2018   Stable angina 01/20/2018   Depression 01/20/2018   Essential hypertension 01/20/2018   Sleep apnea 10/06/2016   Tobacco use disorder 12/19/2015   Type 2 diabetes mellitus with circulatory disorder, without long-term current use of insulin (HCC) 12/19/2015   Hyperlipidemia with target LDL less than 70 12/19/2015   Bladder  cancer (HCC) 12/19/2015   Esophageal reflux 12/19/2015   Essential tremor 12/19/2015   History of stroke 12/19/2015    PCP: Stevphen Rochester, MD  REFERRING PROVIDER: Malen Gauze, MD   REFERRING DIAG: Left renal mass; Weakness   THERAPY DIAG:  Muscle weakness (generalized)  Unsteadiness on feet  Rationale for Evaluation and Treatment: Rehabilitation  ONSET DATE: 4 years ago   SUBJECTIVE:   SUBJECTIVE STATEMENT: Over did it at home, tired already  PERTINENT HISTORY: Diabetes type 2, angina, hypertension, COPD, essential tremor, history of cancer, history of CVA, history of MI, depression, and arthritis PAIN:  Are you having pain? Yes: NPRS scale: 3-4/10 Pain location: low back and both legs  Pain description: aching Aggravating factors: prolonged standing and sitting (5 minutes)  Relieving factors: medication  PRECAUTIONS: Fall  RED FLAGS: None   WEIGHT BEARING RESTRICTIONS: No  FALLS:  Has patient fallen in last 6 months? Yes. Number of falls 3-4; one was in December 2023 when he fell and hit his head when he tripped in his bedroom   LIVING ENVIRONMENT: Lives with: lives alone Lives in: House/apartment Stairs: No Has following equipment at home: Ramped entry  OCCUPATION: retired  PLOF: Independent with basic ADLs  PATIENT GOALS: improved strength, balance, and be able to pick up sticks in his yard  NEXT MD VISIT: 03/02/23  OBJECTIVE:   COGNITION: Overall cognitive status: Within functional limits for tasks assessed     SENSATION: Light touch: Impaired with diminished sensation in both lower extremities with no dermatomal pattern Patient reports numbness in both lower extremities  EDEMA:  No edema observed  POSTURE: rounded shoulders, forward head, and flexed trunk   LOWER EXTREMITY ROM: WFL for activities assessed  LOWER EXTREMITY MMT:  MMT Right eval Left eval  Hip flexion 4-/5 4-/5  Hip extension    Hip abduction    Hip  adduction    Hip internal rotation    Hip external rotation    Knee flexion 4/5 4/5  Knee extension 3+/5 4+/5  Ankle dorsiflexion 3/5 3/5  Ankle plantarflexion    Ankle inversion    Ankle eversion     (Blank rows = not tested)  FUNCTIONAL TESTS:  5 times sit to stand: 1:08.69 seconds with significant upper extremity support from the armrests Timed up and go (TUG): 22.54 seconds 30 seconds sit to stand test: 2 repetitions   GAIT: Assistive device utilized: None Level of assistance: Complete Independence Comments: Flexed trunk with decreased stride length and poor foot  clearance bilaterally   TODAY'S TREATMENT:                                                                                                                              DATE:                             01-11-23                                    EXERCISE LOG  Exercise Repetitions and Resistance Comments  Nustep  L4 x 18 mins   Rocker board X 5 mins DF/PF, balance   LAQ's 2# 3x10 Bil.   HS curls Green 2x10 each side        Blank cell = exercise not performed today    PATIENT EDUCATION:  Education details: Plan of care, healing, prognosis, and goals for therapy Person educated: Patient Education method: Explanation Education comprehension: verbalized understanding  HOME EXERCISE PROGRAM:   ASSESSMENT:  CLINICAL IMPRESSION: Pt arrived today a little fatigued from working some in the yard today. Rx focused on LE strengthening exs in sitting as well as standing. Balance on the rocker board was challenging , but did well with UE assist PRN.    OBJECTIVE IMPAIRMENTS: Abnormal gait, decreased activity tolerance, decreased balance, decreased mobility, difficulty walking, decreased strength, impaired sensation, postural dysfunction, and pain.   ACTIVITY LIMITATIONS: carrying, lifting, standing, stairs, transfers, and locomotion level  PARTICIPATION LIMITATIONS: cleaning, laundry, shopping, community activity,  and yard work  PERSONAL FACTORS: Age, Past/current experiences, Time since onset of injury/illness/exacerbation, and 3+ comorbidities: Diabetes type 2, angina, hypertension, COPD, essential tremor, history of cancer, history of CVA, history of MI, depression, and arthritis  are also affecting patient's functional outcome.   REHAB POTENTIAL: Good  CLINICAL DECISION MAKING: Evolving/moderate complexity  EVALUATION COMPLEXITY: Moderate   GOALS: Goals reviewed with patient? Yes  SHORT TERM GOALS: Target date: 01/26/23 Patient will be independent with his initial HEP. Baseline: Goal status: INITIAL  2.  Patient will improve his 5 times sit to stand time to 45 seconds or less for improved lower extremity power. Baseline:  Goal status: INITIAL  3.  Patient will improve his timed up and go time to 16 seconds or less for improved functional mobility. Baseline:  Goal status: INITIAL  LONG TERM GOALS: Target date: 02/16/23  Patient will be independent with his advanced HEP. Baseline:  Goal status: INITIAL  2.  Patient will be able to safely pick up items from the floor for improved household independence. Baseline:  Goal status: INITIAL  3.  Patient will improve his 5 times sit to stand time to 35 seconds or less for improved lower extremity power. Baseline:  Goal status: INITIAL  4.  Patient will improve his timed up and go time to 12 seconds or less  to reduce his fall risk. Baseline:  Goal status: INITIAL  5.  Patient will be able to safely transfer from sitting to standing with minimal to no upper extremity support. Baseline:  Goal status: INITIAL  PLAN:  PT FREQUENCY: 2x/week  PT DURATION: 6 weeks  PLANNED INTERVENTIONS: Therapeutic exercises, Therapeutic activity, Neuromuscular re-education, Balance training, Gait training, Patient/Family education, Self Care, Stair training, and Re-evaluation  PLAN FOR NEXT SESSION: NuStep, lower extremity strengthening, and balance  interventions  Doy Taaffe,CHRIS, PTA 01/11/2023, 6:02 PM

## 2023-01-13 ENCOUNTER — Other Ambulatory Visit: Payer: Self-pay | Admitting: Gastroenterology

## 2023-01-13 DIAGNOSIS — Z86010 Personal history of colonic polyps: Secondary | ICD-10-CM

## 2023-01-14 ENCOUNTER — Ambulatory Visit: Payer: Medicare Other

## 2023-01-14 DIAGNOSIS — M6281 Muscle weakness (generalized): Secondary | ICD-10-CM

## 2023-01-14 DIAGNOSIS — R2681 Unsteadiness on feet: Secondary | ICD-10-CM

## 2023-01-14 NOTE — Therapy (Signed)
OUTPATIENT PHYSICAL THERAPY LOWER EXTREMITY TREATMENT   Patient Name: Andrew [name] MRN: [id number] DOB:1951-03-23, 72 y.o., male Today's Date: 01/14/2023  END OF SESSION:  PT End of Session - 01/14/23 1148     Visit Number 3    Number of Visits 12    Date for PT Re-Evaluation 03/18/23    PT Start Time 1145    PT Stop Time 1230    PT Time Calculation (min) 45 min    Activity Tolerance Patient tolerated treatment well    Behavior During Therapy [facility] for tasks assessed/performed             Past Medical History:  Diagnosis Date   Acute viral pericarditis 09/03/2018   Inferior STE - but Negative Troponin.  CP &SOB.  Felt to be Viral.   Anemia    Arthritis    Bladder cancer (HCC) 2014   CAD (coronary artery disease)    Cataract    bilateral   Complication of anesthesia    Depression    Diabetes mellitus without complication (HCC)    Essential hypertension 01/20/2018   in the past no longer on medication   GERD (gastroesophageal reflux disease)    Heart attack (HCC) 03/2022   Hx of adenomatous colonic polyps 09/2017   colonoscopy   Hyperlipidemia with target LDL less than 70 12/19/2015   Intraoperative floppy iris syndrome (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 DES PCI RI, PTCA of RPDA 01/21/2018   12/2017 - Cath for ? STEMI -> distal/apical LAD & m-dCx CTO (unable to cross Cx).  Mod rPDA. Severe RI - DES PCI.  08/2018 - Cath for ? Inf STEMI - RI stent patent & CTO dLAD/mCx. Progression of rPDA 95% -> PTCA only.  Thought to be Pericarditis & not MI (troponin negative).    Neuromuscular disorder (HCC)    Sleep apnea    BiPAP not currently being used   Status post tendon repair 1989   STEMI (ST elevation myocardial infarction) (HCC) 01/20/2018   Cardiac cath January 21, 2018: Severe multivessel disease with unclear lesion, but opted for PCI/DES x1 to the RI.  Appeared to have CTO of the distal LAD and circumflex as well as moderate mid  LAD and diagonal disease as well as PDA.Marland Kitchen  Unable to cross circumflex lesion.   STEMI (ST elevation myocardial infarction) (HCC) 03/2022   Stroke (HCC) 11/2015   At times pt has dizziness with loss of vision   Tremors of nervous system 2011   Past Surgical History:  Procedure Laterality Date   APPENDECTOMY     BLADDER SURGERY     COLONOSCOPY     CORONARY STENT INTERVENTION N/A 01/21/2018   Procedure: CORONARY STENT INTERVENTION;  Surgeon: Marykay Lex, MD;  Location: [facility] INVASIVE CV LAB;  Service: Cardiovascular;  Laterality: N/A;  95% Ramus Intermedius -PCI with synergy DES 2.25 mm x 12 mm postdilated 2.4 mm.   CORONARY/GRAFT ACUTE MI REVASCULARIZATION N/A 01/21/2018   Procedure: Coronary/Graft Acute MI Revascularization;  Surgeon: Marykay Lex, MD;  Location: [facility] INVASIVE CV LAB;  Service: Cardiovascular;  Laterality: N/A;  attempted revas to distal CFX;    CORONARY/GRAFT ACUTE MI REVASCULARIZATION N/A 09/04/2018   Procedure: Coronary/Graft Acute MI Revascularization;  Surgeon: Tonny Bollman, MD;  Location: [facility] INVASIVE CV LAB:: PTCA/POBA of rPDA (2.0 mm balloon)   CYSTOSCOPY N/A 01/21/2020   Procedure: CYSTOSCOPY;  Surgeon: Malen Gauze, MD;  Location: [facility];  Service: Urology;  Laterality: N/A;   CYSTOSCOPY N/A 02/09/2021   Procedure: CYSTOSCOPY;  Surgeon: Malen Gauze, MD;  Location: [facility];  Service: Urology;  Laterality: N/A;   CYSTOSCOPY Left 11/08/2022   Procedure: CYSTOSCOPY;  Surgeon: Malen Gauze, MD;  Location: [facility];  Service: Urology;  Laterality: Left;   CYSTOSCOPY W/ RETROGRADES Bilateral 08/22/2015   Procedure: CYSTOSCOPY WITH RETROGRADE PYELOGRAM;  Surgeon: Bjorn Pippin, MD;  Location: [facility];  Service: Urology;  Laterality: Bilateral;   CYSTOSCOPY W/ RETROGRADES Bilateral 01/16/2016   Procedure: CYSTOSCOPY WITH RETROGRADE PYELOGRAM;  Surgeon: Bjorn Pippin, MD;  Location: [facility];  Service: Urology;  Laterality: Bilateral;   CYSTOSCOPY W/ RETROGRADES  Bilateral 01/07/2017   Procedure: CYSTOSCOPY WITH RETROGRADE PYELOGRAM;  Surgeon: Bjorn Pippin, MD;  Location: [facility];  Service: Urology;  Laterality: Bilateral;   CYSTOSCOPY WITH BIOPSY N/A 08/22/2015   Procedure: CYSTOSCOPY WITH BLADDER BIOPSY;  Surgeon: Bjorn Pippin, MD;  Location: [facility];  Service: Urology;  Laterality: N/A;   CYSTOSCOPY WITH BIOPSY N/A 01/16/2016   Procedure: CYSTOSCOPY WITH BLADDER BIOPSY;  Surgeon: Bjorn Pippin, MD;  Location: [facility];  Service: Urology;  Laterality: N/A;   CYSTOSCOPY WITH FULGERATION N/A 01/16/2016   Procedure: CYSTOSCOPY WITH FULGERATION;  Surgeon: Bjorn Pippin, MD;  Location: [facility];  Service: Urology;  Laterality: N/A;   CYSTOSCOPY/RETROGRADE/URETEROSCOPY Left 09/06/2022   Procedure: CYSTOSCOPY/RETROGRADE/URETEROSCOPY/STENT PLACEMENT;  Surgeon: Malen Gauze, MD;  Location: [facility];  Service: Urology;  Laterality: Left;   KNEE ARTHROSCOPY Right 1989   LEFT HEART CATH AND CORONARY ANGIOGRAPHY N/A 01/21/2018   Procedure: LEFT HEART CATH AND CORONARY ANGIOGRAPHY;  Surgeon: Marykay Lex, MD;  Location: [facility] INVASIVE CV LAB;  Service: Cardiovascular;; 95% RI-DES PCI.  80% o-pCX (PTCA) -> dCX 80%-100% CTO (unsuccessful PTCA unable to cross distal lesion with balloon.)  100% CTO dLAD. RPDA 80% and 70% as well as P AV 180%, PA V2 60%.  (too small for PCI)   LEFT HEART CATH AND CORONARY ANGIOGRAPHY N/A 09/04/2018   Procedure: LEFT HEART CATH AND CORONARY ANGIOGRAPHY;  Surgeon: Tonny Bollman, MD;  Location: [facility] INVASIVE CV LAB: (presumed Inferior STEMI) = progression of small rPDA -95% (PTCA only). Patent RI stent. CTO of apical LAD & mCx.   ROBOT ASSITED LAPAROSCOPIC NEPHROURETERECTOMY Left 11/08/2022   Procedure: XI ROBOT ASSITED LAPAROSCOPIC NEPHROURETERECTOMY;  Surgeon: Malen Gauze, MD;  Location: [facility];  Service: Urology;  Laterality: Left;   ROTATOR CUFF REPAIR Bilateral 2000   rt. leg fracture surgery     TRANSTHORACIC ECHOCARDIOGRAM  01/21/2018   Mild  LVH.  EF 50 and 55%.  Apical inferior hypokinesis.  Mid-apical anterolateral hypokinesis.  Normal diastolic function for age    TRANSTHORACIC ECHOCARDIOGRAM  09/04/2018   -? Inf STEMI vs. Pericarditis:  Mildly reduced EF of 45 to 50%.  Impaired relaxation-GR 1 DD.  Severe hypokinesis of the anterolateral and inferolateral wall.  Small-moderate anterior pericardial effusion.  Moderate aortic sclerosis but no stenosis.   TRANSURETHRAL RESECTION OF BLADDER TUMOR N/A 01/07/2017   Procedure: TRANSURETHRAL RESECTION OF BLADDER TUMOR (TURBT);  Surgeon: Bjorn Pippin, MD;  Location: [facility];  Service: Urology;  Laterality: N/A;   TRANSURETHRAL RESECTION OF BLADDER TUMOR N/A 01/21/2020   Procedure: TRANSURETHRAL RESECTION OF BLADDER TUMOR (TURBT);  Surgeon: Malen Gauze, MD;  Location: [facility];  Service: Urology;  Laterality: N/A;   TRANSURETHRAL RESECTION OF BLADDER TUMOR N/A 02/09/2021   Procedure: TRANSURETHRAL RESECTION OF BLADDER TUMOR (TURBT);  Surgeon: Ronne Binning,  Mardene Celeste, MD;  Location: [facility];  Service: Urology;  Laterality: N/A;   URETERAL BIOPSY Left 09/06/2022   Procedure: URETERAL BIOPSY/fulgeration;  Surgeon: Malen Gauze, MD;  Location: [facility];  Service: Urology;  Laterality: Left;   WISDOM TOOTH EXTRACTION     Patient Active Problem List   Diagnosis Date Noted   Ureteral cancer, 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 DES PCI RI, PTCA of RPDA 01/21/2018   Stable angina 01/20/2018   Depression 01/20/2018   Essential hypertension 01/20/2018   Sleep apnea 10/06/2016   Tobacco use disorder 12/19/2015   Type 2 diabetes mellitus with circulatory disorder, without long-term  current use of insulin (HCC) 12/19/2015   Hyperlipidemia with target LDL less than 70 12/19/2015   Bladder cancer (HCC) 12/19/2015   Esophageal reflux 12/19/2015   Essential tremor 12/19/2015   History of stroke 12/19/2015    PCP: Stevphen Rochester, MD  REFERRING PROVIDER: Malen Gauze, MD   REFERRING DIAG: Left renal mass; Weakness   THERAPY DIAG:  Muscle weakness (generalized)  Unsteadiness on feet  Rationale for Evaluation and Treatment: Rehabilitation  ONSET DATE: 4 years ago   SUBJECTIVE:   SUBJECTIVE STATEMENT: Pt reports feeling stiff today and a little pain  PERTINENT HISTORY: Diabetes type 2, angina, hypertension, COPD, essential tremor, history of cancer, history of CVA, history of MI, depression, and arthritis PAIN:  Are you having pain? Yes: NPRS scale: 5/10 Pain location: low back and both legs  Pain description: aching Aggravating factors: prolonged standing and sitting (5 minutes)  Relieving factors: medication  PRECAUTIONS: Fall  RED FLAGS: None   WEIGHT BEARING RESTRICTIONS: No  FALLS:  Has patient fallen in last 6 months? Yes. Number of falls 3-4; one was in December 2023 when he fell and hit his head when he tripped in his bedroom   LIVING ENVIRONMENT: Lives with: lives alone Lives in: House/apartment Stairs: No Has following equipment at home: Ramped entry  OCCUPATION: retired  PLOF: Independent with basic ADLs  PATIENT GOALS: improved strength, balance, and be able to pick up sticks in his yard  NEXT MD VISIT: 03/02/23  OBJECTIVE:   COGNITION: Overall cognitive status: Within functional limits for tasks assessed     SENSATION: Light touch: Impaired with diminished sensation in both lower extremities with no dermatomal pattern Patient reports numbness in both lower extremities  EDEMA:  No edema observed  POSTURE: rounded shoulders, forward head, and flexed trunk   LOWER EXTREMITY ROM: WFL for activities  assessed  LOWER EXTREMITY MMT:  MMT Right eval Left eval  Hip flexion 4-/5 4-/5  Hip extension    Hip abduction    Hip adduction    Hip internal rotation    Hip external rotation    Knee flexion 4/5 4/5  Knee extension 3+/5 4+/5  Ankle dorsiflexion 3/5 3/5  Ankle plantarflexion    Ankle inversion    Ankle eversion     (Blank rows = not tested)  FUNCTIONAL TESTS:  5 times sit to stand: 1:08.69 seconds with significant upper extremity support from the armrests Timed up and go (TUG): 22.54 seconds 30 seconds sit to stand test: 2 repetitions  GAIT: Assistive device utilized: None Level of assistance: Complete Independence Comments: Flexed trunk with decreased stride length and poor foot clearance bilaterally   TODAY'S TREATMENT:                                                                                                                              DATE:                             01-14-23                                    EXERCISE LOG  Exercise Repetitions and Resistance Comments  Nustep L4 x 18 mins   Rocker board X 5 mins DF/PF, balance   LAQ's 3# x30 reps bil   HS curls Green 2x10 each side   Standing Marches Airex x 4 mins   Seated Hip Abduction X3 mins    Blank cell = exercise not performed today    PATIENT EDUCATION:  Education details: Plan of care, healing, prognosis, and goals for therapy Person educated: Patient Education method: Explanation Education comprehension: verbalized understanding  HOME EXERCISE PROGRAM:   ASSESSMENT:  CLINICAL IMPRESSION:  Pt arrives for today's treatment session reporting 5/10 bil knee and ankle pain.  Pt introduced to standing exercises today with good results.  Pt requiring min cues for proper technique with standing exercises.  Pt requiring BUE use with all standing exercises.  Pt also able to tolerate increase reps with seated exercises today.  Pt eager to increase muscle mass in BLEs.  Pt denied any change in  pain at completion of today's treatment session.     OBJECTIVE IMPAIRMENTS: Abnormal gait, decreased activity tolerance, decreased balance, decreased mobility, difficulty walking, decreased strength, impaired sensation, postural dysfunction, and pain.   ACTIVITY LIMITATIONS: carrying, lifting, standing, stairs, transfers, and locomotion level  PARTICIPATION LIMITATIONS: cleaning, laundry, shopping, community activity, and yard work  PERSONAL FACTORS: Age, Past/current experiences, Time since onset of injury/illness/exacerbation, and 3+ comorbidities: Diabetes type 2, angina, hypertension, COPD, essential tremor, history of cancer, history of CVA, history of MI, depression, and arthritis  are also affecting patient's functional outcome.   REHAB POTENTIAL: Good  CLINICAL DECISION MAKING: Evolving/moderate complexity  EVALUATION COMPLEXITY: Moderate   GOALS: Goals reviewed with patient? Yes  SHORT TERM GOALS: Target date: 01/26/23 Patient will be independent with his initial HEP. Baseline: Goal status: INITIAL  2.  Patient will improve his 5 times sit to stand time to 45 seconds or less for improved lower extremity power. Baseline:  Goal status: INITIAL  3.  Patient will improve his timed up and go time to 16 seconds or less for improved functional mobility. Baseline:  Goal status: INITIAL  LONG TERM GOALS: Target date: 02/16/23  Patient will be independent with his advanced HEP. Baseline:  Goal status:  INITIAL  2.  Patient will be able to safely pick up items from the floor for improved household independence. Baseline:  Goal status: INITIAL  3.  Patient will improve his 5 times sit to stand time to 35 seconds or less for improved lower extremity power. Baseline:  Goal status: INITIAL  4.  Patient will improve his timed up and go time to 12 seconds or less to reduce his fall risk. Baseline:  Goal status: INITIAL  5.  Patient will be able to safely transfer from sitting  to standing with minimal to no upper extremity support. Baseline:  Goal status: INITIAL  PLAN:  PT FREQUENCY: 2x/week  PT DURATION: 6 weeks  PLANNED INTERVENTIONS: Therapeutic exercises, Therapeutic activity, Neuromuscular re-education, Balance training, Gait training, Patient/Family education, Self Care, Stair training, and Re-evaluation  PLAN FOR NEXT SESSION: NuStep, lower extremity strengthening, and balance interventions  Newman Pies, PTA 01/14/2023, 12:55 PM

## 2023-01-18 ENCOUNTER — Ambulatory Visit: Payer: Medicare Other | Admitting: *Deleted

## 2023-01-18 ENCOUNTER — Encounter: Payer: Self-pay | Admitting: *Deleted

## 2023-01-18 DIAGNOSIS — M6281 Muscle weakness (generalized): Secondary | ICD-10-CM | POA: Diagnosis not present

## 2023-01-18 DIAGNOSIS — R2681 Unsteadiness on feet: Secondary | ICD-10-CM

## 2023-01-18 NOTE — Therapy (Signed)
OUTPATIENT PHYSICAL THERAPY LOWER EXTREMITY TREATMENT   Patient Name: Andrew [name] MRN: [id number] DOB:July 29, 1950, 72 y.o., male Today's Date: 01/18/2023  END OF SESSION:  PT End of Session - 01/18/23 1346     Visit Number 4    Number of Visits 12    Date for PT Re-Evaluation 03/18/23    PT Start Time 1346    PT Stop Time 1435    PT Time Calculation (min) 49 min             Past Medical History:  Diagnosis Date   Acute viral pericarditis 09/03/2018   Inferior STE - but Negative Troponin.  CP &SOB.  Felt to be Viral.   Anemia    Arthritis    Bladder cancer (HCC) 2014   CAD (coronary artery disease)    Cataract    bilateral   Complication of anesthesia    Depression    Diabetes mellitus without complication (HCC)    Essential hypertension 01/20/2018   in the past no longer on medication   GERD (gastroesophageal reflux disease)    Heart attack (HCC) 03/2022   Hx of adenomatous colonic polyps 09/2017   colonoscopy   Hyperlipidemia with target LDL less than 70 12/19/2015   Intraoperative floppy iris syndrome (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 DES PCI RI, PTCA of RPDA 01/21/2018   12/2017 - Cath for ? STEMI -> distal/apical LAD & m-dCx CTO (unable to cross Cx).  Mod rPDA. Severe RI - DES PCI.  08/2018 - Cath for ? Inf STEMI - RI stent patent & CTO dLAD/mCx. Progression of rPDA 95% -> PTCA only.  Thought to be Pericarditis & not MI (troponin negative).    Neuromuscular disorder (HCC)    Sleep apnea    BiPAP not currently being used   Status post tendon repair 1989   STEMI (ST elevation myocardial infarction) (HCC) 01/20/2018   Cardiac cath January 21, 2018: Severe multivessel disease with unclear lesion, but opted for PCI/DES x1 to the RI.  Appeared to have CTO of the distal LAD and circumflex as well as moderate mid LAD and diagonal disease as well as PDA.Marland Kitchen  Unable to cross circumflex lesion.   STEMI (ST elevation myocardial  infarction) (HCC) 03/2022   Stroke (HCC) 11/2015   At times pt has dizziness with loss of vision   Tremors of nervous system 2011   Past Surgical History:  Procedure Laterality Date   APPENDECTOMY     BLADDER SURGERY     COLONOSCOPY     CORONARY STENT INTERVENTION N/A 01/21/2018   Procedure: CORONARY STENT INTERVENTION;  Surgeon: Marykay Lex, MD;  Location: [facility] INVASIVE CV LAB;  Service: Cardiovascular;  Laterality: N/A;  95% Ramus Intermedius -PCI with synergy DES 2.25 mm x 12 mm postdilated 2.4 mm.   CORONARY/GRAFT ACUTE MI REVASCULARIZATION N/A 01/21/2018   Procedure: Coronary/Graft Acute MI Revascularization;  Surgeon: Marykay Lex, MD;  Location: [facility] INVASIVE CV LAB;  Service: Cardiovascular;  Laterality: N/A;  attempted revas to distal CFX;    CORONARY/GRAFT ACUTE MI REVASCULARIZATION N/A 09/04/2018   Procedure: Coronary/Graft Acute MI Revascularization;  Surgeon: Tonny Bollman, MD;  Location: [facility] INVASIVE CV LAB:: PTCA/POBA of rPDA (2.0 mm balloon)   CYSTOSCOPY N/A 01/21/2020   Procedure: CYSTOSCOPY;  Surgeon: Malen Gauze, MD;  Location: [facility];  Service: Urology;  Laterality: N/A;   CYSTOSCOPY N/A 02/09/2021   Procedure: CYSTOSCOPY;  Surgeon: Wilkie Aye  L, MD;  Location: [facility];  Service: Urology;  Laterality: N/A;   CYSTOSCOPY Left 11/08/2022   Procedure: CYSTOSCOPY;  Surgeon: Malen Gauze, MD;  Location: [facility];  Service: Urology;  Laterality: Left;   CYSTOSCOPY W/ RETROGRADES Bilateral 08/22/2015   Procedure: CYSTOSCOPY WITH RETROGRADE PYELOGRAM;  Surgeon: Bjorn Pippin, MD;  Location: [facility];  Service: Urology;  Laterality: Bilateral;   CYSTOSCOPY W/ RETROGRADES Bilateral 01/16/2016   Procedure: CYSTOSCOPY WITH RETROGRADE PYELOGRAM;  Surgeon: Bjorn Pippin, MD;  Location: [facility];  Service: Urology;  Laterality: Bilateral;   CYSTOSCOPY W/ RETROGRADES Bilateral 01/07/2017   Procedure: CYSTOSCOPY WITH RETROGRADE PYELOGRAM;  Surgeon: Bjorn Pippin, MD;  Location: [facility];  Service: Urology;  Laterality: Bilateral;   CYSTOSCOPY WITH BIOPSY N/A 08/22/2015   Procedure: CYSTOSCOPY WITH BLADDER BIOPSY;  Surgeon: Bjorn Pippin, MD;  Location: [facility];  Service: Urology;  Laterality: N/A;   CYSTOSCOPY WITH BIOPSY N/A 01/16/2016   Procedure: CYSTOSCOPY WITH BLADDER BIOPSY;  Surgeon: Bjorn Pippin, MD;  Location: [facility];  Service: Urology;  Laterality: N/A;   CYSTOSCOPY WITH FULGERATION N/A 01/16/2016   Procedure: CYSTOSCOPY WITH FULGERATION;  Surgeon: Bjorn Pippin, MD;  Location: [facility];  Service: Urology;  Laterality: N/A;   CYSTOSCOPY/RETROGRADE/URETEROSCOPY Left 09/06/2022   Procedure: CYSTOSCOPY/RETROGRADE/URETEROSCOPY/STENT PLACEMENT;  Surgeon: Malen Gauze, MD;  Location: [facility];  Service: Urology;  Laterality: Left;   KNEE ARTHROSCOPY Right 1989   LEFT HEART CATH AND CORONARY ANGIOGRAPHY N/A 01/21/2018   Procedure: LEFT HEART CATH AND CORONARY ANGIOGRAPHY;  Surgeon: Marykay Lex, MD;  Location: [facility] INVASIVE CV LAB;  Service: Cardiovascular;; 95% RI-DES PCI.  80% o-pCX (PTCA) -> dCX 80%-100% CTO (unsuccessful PTCA unable to cross distal lesion with balloon.)  100% CTO dLAD. RPDA 80% and 70% as well as P AV 180%, PA V2 60%.  (too small for PCI)   LEFT HEART CATH AND CORONARY ANGIOGRAPHY N/A 09/04/2018   Procedure: LEFT HEART CATH AND CORONARY ANGIOGRAPHY;  Surgeon: Tonny Bollman, MD;  Location: [facility] INVASIVE CV LAB: (presumed Inferior STEMI) = progression of small rPDA -95% (PTCA only). Patent RI stent. CTO of apical LAD & mCx.   ROBOT ASSITED LAPAROSCOPIC NEPHROURETERECTOMY Left 11/08/2022   Procedure: XI ROBOT ASSITED LAPAROSCOPIC NEPHROURETERECTOMY;  Surgeon: Malen Gauze, MD;  Location: [facility];  Service: Urology;  Laterality: Left;   ROTATOR CUFF REPAIR Bilateral 2000   rt. leg fracture surgery     TRANSTHORACIC ECHOCARDIOGRAM  01/21/2018   Mild LVH.  EF 50 and 55%.  Apical inferior hypokinesis.  Mid-apical anterolateral hypokinesis.  Normal diastolic  function for age    TRANSTHORACIC ECHOCARDIOGRAM  09/04/2018   -? Inf STEMI vs. Pericarditis:  Mildly reduced EF of 45 to 50%.  Impaired relaxation-GR 1 DD.  Severe hypokinesis of the anterolateral and inferolateral wall.  Small-moderate anterior pericardial effusion.  Moderate aortic sclerosis but no stenosis.   TRANSURETHRAL RESECTION OF BLADDER TUMOR N/A 01/07/2017   Procedure: TRANSURETHRAL RESECTION OF BLADDER TUMOR (TURBT);  Surgeon: Bjorn Pippin, MD;  Location: [facility];  Service: Urology;  Laterality: N/A;   TRANSURETHRAL RESECTION OF BLADDER TUMOR N/A 01/21/2020   Procedure: TRANSURETHRAL RESECTION OF BLADDER TUMOR (TURBT);  Surgeon: Malen Gauze, MD;  Location: [facility];  Service: Urology;  Laterality: N/A;   TRANSURETHRAL RESECTION OF BLADDER TUMOR N/A 02/09/2021   Procedure: TRANSURETHRAL RESECTION OF BLADDER TUMOR (TURBT);  Surgeon: Malen Gauze, MD;  Location: [facility];  Service: Urology;  Laterality: N/A;   URETERAL BIOPSY Left  09/06/2022   Procedure: URETERAL BIOPSY/fulgeration;  Surgeon: Malen Gauze, MD;  Location: [facility];  Service: Urology;  Laterality: Left;   WISDOM TOOTH EXTRACTION     Patient Active Problem List   Diagnosis Date Noted   Ureteral cancer, 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 DES PCI RI, PTCA of RPDA 01/21/2018   Stable angina 01/20/2018   Depression 01/20/2018   Essential hypertension 01/20/2018   Sleep apnea 10/06/2016   Tobacco use disorder 12/19/2015   Type 2 diabetes mellitus with circulatory disorder, without long-term current use of insulin (HCC) 12/19/2015   Hyperlipidemia with target LDL less than 70 12/19/2015   Bladder  cancer (HCC) 12/19/2015   Esophageal reflux 12/19/2015   Essential tremor 12/19/2015   History of stroke 12/19/2015    PCP: Stevphen Rochester, MD  REFERRING PROVIDER: Malen Gauze, MD   REFERRING DIAG: Left renal mass; Weakness   THERAPY DIAG:  Muscle weakness (generalized)  Unsteadiness on feet  Rationale for Evaluation and Treatment: Rehabilitation  ONSET DATE: 4 years ago   SUBJECTIVE:   SUBJECTIVE STATEMENT: Pt reports feeling stiff today in both knees and a little pain. Fairly tired already  PERTINENT HISTORY: Diabetes type 2, angina, hypertension, COPD, essential tremor, history of cancer, history of CVA, history of MI, depression, and arthritis PAIN:  Are you having pain? Yes: NPRS scale: 5/10 Pain location: low back and both legs  Pain description: aching Aggravating factors: prolonged standing and sitting (5 minutes)  Relieving factors: medication  PRECAUTIONS: Fall  RED FLAGS: None   WEIGHT BEARING RESTRICTIONS: No  FALLS:  Has patient fallen in last 6 months? Yes. Number of falls 3-4; one was in December 2023 when he fell and hit his head when he tripped in his bedroom   LIVING ENVIRONMENT: Lives with: lives alone Lives in: House/apartment Stairs: No Has following equipment at home: Ramped entry  OCCUPATION: retired  PLOF: Independent with basic ADLs  PATIENT GOALS: improved strength, balance, and be able to pick up sticks in his yard  NEXT MD VISIT: 03/02/23  OBJECTIVE:   COGNITION: Overall cognitive status: Within functional limits for tasks assessed     SENSATION: Light touch: Impaired with diminished sensation in both lower extremities with no dermatomal pattern Patient reports numbness in both lower extremities  EDEMA:  No edema observed  POSTURE: rounded shoulders, forward head, and flexed trunk   LOWER EXTREMITY ROM: WFL for activities assessed  LOWER EXTREMITY MMT:  MMT Right eval Left eval  Hip flexion 4-/5  4-/5  Hip extension    Hip abduction    Hip adduction    Hip internal rotation    Hip external rotation    Knee flexion 4/5 4/5  Knee extension 3+/5 4+/5  Ankle dorsiflexion 3/5 3/5  Ankle plantarflexion    Ankle inversion    Ankle eversion     (Blank rows = not tested)  FUNCTIONAL TESTS:  5 times sit to stand: 1:08.69 seconds with significant upper extremity support from the armrests Timed up and go (TUG): 22.54 seconds 30 seconds sit to stand test: 2 repetitions   GAIT: Assistive device utilized: None Level of assistance: Complete Independence Comments: Flexed  trunk with decreased stride length and poor foot clearance bilaterally   TODAY'S TREATMENT:                                                                                                                              DATE:                                     01-18-23                                    EXERCISE LOG  Exercise Repetitions and Resistance Comments  Nustep L4 x 19 mins   Rocker board X 5 mins DF/PF, balance   LAQ's 3# x30 reps bil   Seated Hip add Ball squeeze 3x10 hold 5 secs   HS curls Green 2x10 each side   Seated Marches 3x10 alt.   Seated Hip Abduction X  3 mins   red tband    Blank cell = exercise not performed today    PATIENT EDUCATION:  Education details: Plan of care, healing, prognosis, and goals for therapy Person educated: Patient Education method: Explanation Education comprehension: verbalized understanding  HOME EXERCISE PROGRAM:   ASSESSMENT:  CLINICAL IMPRESSION:  Pt arrives for today's treatment session reporting 5-6/10 bil knee and ankle pain and fairly tired  Pt having to do more seated exs today due to fatigue.  Pt eager to increase muscle mass in BLEs.  Pt denied any change in pain, and mainly fatigue  at completion of today's treatment session.     OBJECTIVE IMPAIRMENTS: Abnormal gait, decreased activity tolerance, decreased balance, decreased mobility, difficulty  walking, decreased strength, impaired sensation, postural dysfunction, and pain.   ACTIVITY LIMITATIONS: carrying, lifting, standing, stairs, transfers, and locomotion level  PARTICIPATION LIMITATIONS: cleaning, laundry, shopping, community activity, and yard work  PERSONAL FACTORS: Age, Past/current experiences, Time since onset of injury/illness/exacerbation, and 3+ comorbidities: Diabetes type 2, angina, hypertension, COPD, essential tremor, history of cancer, history of CVA, history of MI, depression, and arthritis  are also affecting patient's functional outcome.   REHAB POTENTIAL: Good  CLINICAL DECISION MAKING: Evolving/moderate complexity  EVALUATION COMPLEXITY: Moderate   GOALS: Goals reviewed with patient? Yes  SHORT TERM GOALS: Target date: 01/26/23 Patient will be independent with his initial HEP. Baseline: Goal status: INITIAL  2.  Patient will improve his 5 times sit to stand time to 45 seconds or less for improved lower extremity power. Baseline:  Goal status: INITIAL  3.  Patient will improve his timed up and go time to 16 seconds or less for improved functional mobility. Baseline:  Goal status: INITIAL  LONG TERM GOALS: Target date: 02/16/23  Patient will be independent with his advanced HEP. Baseline:  Goal status: INITIAL  2.  Patient will be able to safely pick up  items from the floor for improved household independence. Baseline:  Goal status: INITIAL  3.  Patient will improve his 5 times sit to stand time to 35 seconds or less for improved lower extremity power. Baseline:  Goal status: INITIAL  4.  Patient will improve his timed up and go time to 12 seconds or less to reduce his fall risk. Baseline:  Goal status: INITIAL  5.  Patient will be able to safely transfer from sitting to standing with minimal to no upper extremity support. Baseline:  Goal status: INITIAL  PLAN:  PT FREQUENCY: 2x/week  PT DURATION: 6 weeks  PLANNED INTERVENTIONS:  Therapeutic exercises, Therapeutic activity, Neuromuscular re-education, Balance training, Gait training, Patient/Family education, Self Care, Stair training, and Re-evaluation  PLAN FOR NEXT SESSION: NuStep, lower extremity strengthening, and balance interventions  Chetara Kropp,CHRIS, PTA 01/18/2023, 3:16 PM

## 2023-01-19 ENCOUNTER — Inpatient Hospital Stay: Payer: Medicare Other

## 2023-01-19 ENCOUNTER — Other Ambulatory Visit: Payer: Self-pay | Admitting: Genetic Counselor

## 2023-01-19 ENCOUNTER — Inpatient Hospital Stay: Payer: Medicare Other | Attending: Genetic Counselor | Admitting: Genetic Counselor

## 2023-01-19 ENCOUNTER — Encounter: Payer: Self-pay | Admitting: Genetic Counselor

## 2023-01-19 DIAGNOSIS — Z8043 Family history of malignant neoplasm of testis: Secondary | ICD-10-CM

## 2023-01-19 DIAGNOSIS — K635 Polyp of colon: Secondary | ICD-10-CM | POA: Diagnosis not present

## 2023-01-19 DIAGNOSIS — Z87891 Personal history of nicotine dependence: Secondary | ICD-10-CM | POA: Diagnosis not present

## 2023-01-19 DIAGNOSIS — C662 Malignant neoplasm of left ureter: Secondary | ICD-10-CM

## 2023-01-19 DIAGNOSIS — Z803 Family history of malignant neoplasm of breast: Secondary | ICD-10-CM

## 2023-01-19 DIAGNOSIS — Z8042 Family history of malignant neoplasm of prostate: Secondary | ICD-10-CM

## 2023-01-19 LAB — GENETIC SCREENING ORDER

## 2023-01-19 NOTE — Progress Notes (Unsigned)
REFERRING PROVIDER: Stevphen Rochester, MD [address] [place],  [place] [address]  PRIMARY PROVIDER:  Stevphen Rochester, MD  PRIMARY REASON FOR VISIT:  1. Family history of prostate cancer   2. Family history of testicular cancer   3. Family history of breast cancer      HISTORY OF PRESENT ILLNESS:   Andrew [name], a 72 y.o. male, was seen for a [place] cancer genetics consultation at the request of Dr. Donzetta Kohut due to a {Personal/family:20331} history of {cancer/polyps}.  Andrew [name] presents to clinic today to discuss the possibility of a hereditary predisposition to cancer, genetic testing, and to further clarify his future cancer risks, as well as potential cancer risks for family members.   In ***, at the age of ***, Andrew [name] was diagnosed with {CA PATHOLOGY:63853} of the {right left (wildcard):15202} {CA ZSWFU:93235}. The treatment plan ***.    *** Andrew [name] is a 72 y.o. male with no personal history of cancer.    CANCER HISTORY:  Oncology History   No history exists.     RISK FACTORS:  Menarche was at age ***.  First live birth at age ***.  OCP use for approximately {Numbers 1-12 multi-select:20307} years.  Ovaries intact: {Yes/No-Ex:120004}.  Hysterectomy: {Yes/No-Ex:120004}.  Menopausal status: {Menopause:31378}.  HRT use: {Numbers 1-12 multi-select:20307} years. Colonoscopy: {Yes/No-Ex:120004}; {normal/abnormal/not examined:14677}. Mammogram within the last year: {Yes/No-Ex:120004}. Number of breast biopsies: {Numbers 1-12 multi-select:20307}. Up to date with pelvic exams: {Yes/No-Ex:120004}. Any excessive radiation exposure in the past: {Yes/No-Ex:120004}  Past Medical History:  Diagnosis Date   Acute viral pericarditis 09/03/2018   Inferior STE - but Negative Troponin.  CP &SOB.  Felt to be Viral.   Anemia    Arthritis    Bladder cancer (HCC) 2014   CAD (coronary artery disease)    Cataract    bilateral   Complication of  anesthesia    Depression    Diabetes mellitus without complication (HCC)    Essential hypertension 01/20/2018   in the past no longer on medication   Family history of breast cancer    Family history of prostate cancer    Family history of testicular cancer    GERD (gastroesophageal reflux disease)    Heart attack (HCC) 03/2022   Hx of adenomatous colonic polyps 09/2017   colonoscopy   Hyperlipidemia with target LDL less than 70 12/19/2015   Intraoperative floppy iris syndrome (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 DES PCI RI, PTCA of RPDA 01/21/2018   12/2017 - Cath for ? STEMI -> distal/apical LAD & m-dCx CTO (unable to cross Cx).  Mod rPDA. Severe RI - DES PCI.  08/2018 - Cath for ? Inf STEMI - RI stent patent & CTO dLAD/mCx. Progression of rPDA 95% -> PTCA only.  Thought to be Pericarditis & not MI (troponin negative).    Neuromuscular disorder (HCC)    Sleep apnea    BiPAP not currently being used   Status post tendon repair 1989   STEMI (ST elevation myocardial infarction) (HCC) 01/20/2018   Cardiac cath January 21, 2018: Severe multivessel disease with unclear lesion, but opted for PCI/DES x1 to the RI.  Appeared to have CTO of the distal LAD and circumflex as well as moderate mid LAD and diagonal disease as well as PDA.Marland Kitchen  Unable to cross circumflex lesion.   STEMI (ST elevation myocardial infarction) (HCC) 03/2022   Stroke (HCC) 11/2015   At  times pt has dizziness with loss of vision   Tremors of nervous system 2011    Past Surgical History:  Procedure Laterality Date   APPENDECTOMY     BLADDER SURGERY     COLONOSCOPY     CORONARY STENT INTERVENTION N/A 01/21/2018   Procedure: CORONARY STENT INTERVENTION;  Surgeon: Marykay Lex, MD;  Location: [facility] INVASIVE CV LAB;  Service: Cardiovascular;  Laterality: N/A;  95% Ramus Intermedius -PCI with synergy DES 2.25 mm x 12 mm postdilated 2.4 mm.   CORONARY/GRAFT ACUTE MI REVASCULARIZATION N/A  01/21/2018   Procedure: Coronary/Graft Acute MI Revascularization;  Surgeon: Marykay Lex, MD;  Location: [facility] INVASIVE CV LAB;  Service: Cardiovascular;  Laterality: N/A;  attempted revas to distal CFX;    CORONARY/GRAFT ACUTE MI REVASCULARIZATION N/A 09/04/2018   Procedure: Coronary/Graft Acute MI Revascularization;  Surgeon: Tonny Bollman, MD;  Location: [facility] INVASIVE CV LAB:: PTCA/POBA of rPDA (2.0 mm balloon)   CYSTOSCOPY N/A 01/21/2020   Procedure: CYSTOSCOPY;  Surgeon: Malen Gauze, MD;  Location: [facility];  Service: Urology;  Laterality: N/A;   CYSTOSCOPY N/A 02/09/2021   Procedure: CYSTOSCOPY;  Surgeon: Malen Gauze, MD;  Location: [facility];  Service: Urology;  Laterality: N/A;   CYSTOSCOPY Left 11/08/2022   Procedure: CYSTOSCOPY;  Surgeon: Malen Gauze, MD;  Location: [facility];  Service: Urology;  Laterality: Left;   CYSTOSCOPY W/ RETROGRADES Bilateral 08/22/2015   Procedure: CYSTOSCOPY WITH RETROGRADE PYELOGRAM;  Surgeon: Bjorn Pippin, MD;  Location: [facility];  Service: Urology;  Laterality: Bilateral;   CYSTOSCOPY W/ RETROGRADES Bilateral 01/16/2016   Procedure: CYSTOSCOPY WITH RETROGRADE PYELOGRAM;  Surgeon: Bjorn Pippin, MD;  Location: [facility];  Service: Urology;  Laterality: Bilateral;   CYSTOSCOPY W/ RETROGRADES Bilateral 01/07/2017   Procedure: CYSTOSCOPY WITH RETROGRADE PYELOGRAM;  Surgeon: Bjorn Pippin, MD;  Location: [facility];  Service: Urology;  Laterality: Bilateral;   CYSTOSCOPY WITH BIOPSY N/A 08/22/2015   Procedure: CYSTOSCOPY WITH BLADDER BIOPSY;  Surgeon: Bjorn Pippin, MD;  Location: [facility];  Service: Urology;  Laterality: N/A;   CYSTOSCOPY WITH BIOPSY N/A 01/16/2016   Procedure: CYSTOSCOPY WITH BLADDER BIOPSY;  Surgeon: Bjorn Pippin, MD;  Location: [facility];  Service: Urology;  Laterality: N/A;   CYSTOSCOPY WITH FULGERATION N/A 01/16/2016   Procedure: CYSTOSCOPY WITH FULGERATION;  Surgeon: Bjorn Pippin, MD;  Location: [facility];  Service: Urology;  Laterality: N/A;    CYSTOSCOPY/RETROGRADE/URETEROSCOPY Left 09/06/2022   Procedure: CYSTOSCOPY/RETROGRADE/URETEROSCOPY/STENT PLACEMENT;  Surgeon: Malen Gauze, MD;  Location: [facility];  Service: Urology;  Laterality: Left;   KNEE ARTHROSCOPY Right 1989   LEFT HEART CATH AND CORONARY ANGIOGRAPHY N/A 01/21/2018   Procedure: LEFT HEART CATH AND CORONARY ANGIOGRAPHY;  Surgeon: Marykay Lex, MD;  Location: [facility] INVASIVE CV LAB;  Service: Cardiovascular;; 95% RI-DES PCI.  80% o-pCX (PTCA) -> dCX 80%-100% CTO (unsuccessful PTCA unable to cross distal lesion with balloon.)  100% CTO dLAD. RPDA 80% and 70% as well as P AV 180%, PA V2 60%.  (too small for PCI)   LEFT HEART CATH AND CORONARY ANGIOGRAPHY N/A 09/04/2018   Procedure: LEFT HEART CATH AND CORONARY ANGIOGRAPHY;  Surgeon: Tonny Bollman, MD;  Location: [facility] INVASIVE CV LAB: (presumed Inferior STEMI) = progression of small rPDA -95% (PTCA only). Patent RI stent. CTO of apical LAD & mCx.   ROBOT ASSITED LAPAROSCOPIC NEPHROURETERECTOMY Left 11/08/2022   Procedure: XI ROBOT ASSITED LAPAROSCOPIC NEPHROURETERECTOMY;  Surgeon: Malen Gauze, MD;  Location: [facility];  Service: Urology;  Laterality: Left;  ROTATOR CUFF REPAIR Bilateral 2000   rt. leg fracture surgery     TRANSTHORACIC ECHOCARDIOGRAM  01/21/2018   Mild LVH.  EF 50 and 55%.  Apical inferior hypokinesis.  Mid-apical anterolateral hypokinesis.  Normal diastolic function for age    TRANSTHORACIC ECHOCARDIOGRAM  09/04/2018   -? Inf STEMI vs. Pericarditis:  Mildly reduced EF of 45 to 50%.  Impaired relaxation-GR 1 DD.  Severe hypokinesis of the anterolateral and inferolateral wall.  Small-moderate anterior pericardial effusion.  Moderate aortic sclerosis but no stenosis.   TRANSURETHRAL RESECTION OF BLADDER TUMOR N/A 01/07/2017   Procedure: TRANSURETHRAL RESECTION OF BLADDER TUMOR (TURBT);  Surgeon: Bjorn Pippin, MD;  Location: [facility];  Service: Urology;  Laterality: N/A;   TRANSURETHRAL RESECTION OF BLADDER TUMOR  N/A 01/21/2020   Procedure: TRANSURETHRAL RESECTION OF BLADDER TUMOR (TURBT);  Surgeon: Malen Gauze, MD;  Location: [facility];  Service: Urology;  Laterality: N/A;   TRANSURETHRAL RESECTION OF BLADDER TUMOR N/A 02/09/2021   Procedure: TRANSURETHRAL RESECTION OF BLADDER TUMOR (TURBT);  Surgeon: Malen Gauze, MD;  Location: [facility];  Service: Urology;  Laterality: N/A;   URETERAL BIOPSY Left 09/06/2022   Procedure: URETERAL BIOPSY/fulgeration;  Surgeon: Malen Gauze, MD;  Location: [facility];  Service: Urology;  Laterality: Left;   WISDOM TOOTH EXTRACTION      Social History   Socioeconomic History   Marital status: Single    Spouse name: Not on file   Number of children: 1   Years of education: some college   Highest education level: Some college, no degree  Occupational History   Occupation: retired    Comment: Holiday representative, some farming  Tobacco Use   Smoking status: Former    Current packs/day: 1.00    Average packs/day: 1 pack/day for 50.0 years (50.0 ttl pk-yrs)    Types: Cigarettes   Smokeless tobacco: Former  Building services engineer status: Never Used  Substance and Sexual Activity   Alcohol use: No    Comment: rarely   Drug use: No   Sexual activity: Never  Other Topics Concern   Not on file  Social History Narrative   Mayford is retired and lives alone. He has a daughter that lives locally that he sees regularly. He has a couple of horses and an outside dog. He enjoys working outside and taking care of his horses.    Social Determinants of Health   Financial Resource Strain: Low Risk  (08/23/2022)   Received from [facility], [facility]   Overall Financial Resource Strain (CARDIA)    Difficulty of Paying Living Expenses: Not hard at all  Food Insecurity: No Food Insecurity (11/08/2022)   Hunger Vital Sign    Worried About Running Out of Food in the Last Year: Never true    Ran Out of Food in the Last Year: Never true  Transportation Needs: No  Transportation Needs (11/08/2022)   PRAPARE - Administrator, Civil Service (Medical): No    Lack of Transportation (Non-Medical): No  Physical Activity: Unknown (04/20/2022)   [facility] from [facility], [facility]   Exercise Vital Sign    Days of Exercise per Week: 0 days    Minutes of Exercise per Session: Not on file  Stress: No Stress Concern Present (04/20/2022)   Received from [facility], [facility] of Occupational Health - Occupational Stress Questionnaire    Feeling of Stress : Not at all  Recent Concern: Stress - Stress  Concern Present (03/25/2022)   Received from [facility] of Occupational Health - Occupational Stress Questionnaire    Feeling of Stress : To some extent  Social Connections: Somewhat Isolated (04/20/2022)   [facility], [facility]   Social Network    How would you rate your social network (family, work, friends)?: Restricted participation with some degree of social isolation     FAMILY HISTORY:  We obtained a detailed, 4-generation family history.  Significant diagnoses are listed below: Family History  Problem Relation Age of Onset   Diabetes Mother 70       type 1   Stroke Mother    Dementia Father    Diabetes Brother    Heart attack Brother 47   Testicular cancer Brother    Diabetes Brother    Testicular cancer Brother    Breast cancer Maternal Aunt    Lung cancer Maternal Uncle    Bladder Cancer Paternal Grandmother    Prostate cancer Paternal Grandfather    Colon cancer Neg Hx    Colon polyps Neg Hx    Esophageal cancer Neg Hx    Rectal cancer Neg Hx    Stomach cancer Neg Hx    Pancreatic cancer Neg Hx     Andrew [name] is {aware/unaware} of previous family history of genetic testing for hereditary cancer risks. Patient's maternal ancestors are of *** descent, and paternal ancestors are of *** descent. There {IS NO:12509} reported Ashkenazi Jewish ancestry.  There {IS NO:12509} known consanguinity.  GENETIC COUNSELING ASSESSMENT: Andrew [name] is a 72 y.o. male with a {Personal/family:20331} history of {cancer/polyps} which is somewhat suggestive of a {DISEASE} and predisposition to cancer given ***. We, therefore, discussed and recommended the following at today's visit.   DISCUSSION: We discussed that, in general, most cancer is not inherited in families, but instead is sporadic or familial. Sporadic cancers occur by chance and typically happen at older ages (>50 years) as this type of cancer is caused by genetic changes acquired during an individual's lifetime. Some families have more cancers than would be expected by chance; however, the ages or types of cancer are not consistent with a known genetic mutation or known genetic mutations have been ruled out. This type of familial cancer is thought to be due to a combination of multiple genetic, environmental, hormonal, and lifestyle factors. While this combination of factors likely increases the risk of cancer, the exact source of this risk is not currently identifiable or testable.  We discussed that *** - ***% of *** is hereditary, with most cases associated with ***.  There are other genes that can be associated with hereditary *** cancer syndromes.  These include ***.  We discussed that testing is beneficial for several reasons including knowing how to follow individuals after completing their treatment, identifying whether potential treatment options such as PARP inhibitors would be beneficial, and understand if other family members could be at risk for cancer and allow them to undergo genetic testing.   We reviewed the characteristics, features and inheritance patterns of hereditary cancer syndromes. We also discussed genetic testing, including the appropriate family members to test, the process of testing, insurance coverage and turn-around-time for results. We discussed the implications of a negative,  positive, carrier and/or variant of uncertain significant result. Andrew [name]  was offered a common hereditary cancer panel (40+ genes) and an expanded pan-cancer panel (70+ genes). Andrew [name] was informed of the benefits and limitations of each panel, including  that expanded pan-cancer panels contain genes that do not have clear management guidelines at this point in time.  We also discussed that as the number of genes included on a panel increases, the chances of variants of uncertain significance increases. Andrew [name] decided to pursue genetic testing for the *** gene panel.   Based on Andrew [name] {Personal/family:20331} history of cancer, he meets medical criteria for genetic testing. Despite that he meets criteria, he may still have an out of pocket cost. We discussed that if his out of pocket cost for testing is over $100, the laboratory will call and confirm whether he wants to proceed with testing.  If the out of pocket cost of testing is less than $100 he will be billed by the genetic testing laboratory.   ***We reviewed the characteristics, features and inheritance patterns of hereditary cancer syndromes. We also discussed genetic testing, including the appropriate family members to test, the process of testing, insurance coverage and turn-around-time for results. We discussed the implications of a negative, positive and/or variant of uncertain significant result. In order to get genetic test results in a timely manner so that Andrew [name] can use these genetic test results for surgical decisions, we recommended Andrew [name] pursue genetic testing for the ***. Once complete, we recommend Andrew [name] pursue reflex genetic testing to the *** gene panel.   Based on Andrew [name] {Personal/family:20331} history of cancer, he meets medical criteria for genetic testing. Despite that he meets criteria, he may still have an out of pocket cost.   ***We discussed with Andrew [name] that the  {Personal/family:20331} history does not meet insurance or NCCN criteria for genetic testing and, therefore, is not highly consistent with a familial hereditary cancer syndrome.  We feel he is at low risk to harbor a gene mutation associated with such a condition. Thus, we did not recommend any genetic testing, at this time, and recommended Andrew [name] continue to follow the cancer screening guidelines given by his primary healthcare provider.  ***In order to estimate his chance of having a {CA GENE:62345} mutation, we used statistical models ({GENMODELS:62370}) that consider his personal medical history, family history and ancestry.  Because each model is different, there can be a lot of variability in the risks they give.  Therefore, these numbers must be considered a rough range and not a precise risk of having a {CA GENE:62345} mutation.  These models estimate that he has approximately a ***-***% chance of having a mutation. Based on this assessment of his family and personal history, genetic testing {IS/ISNOT:34056} recommended.  ***Based on the patient's {Personal/family:20331} history, a statistical model ({GENMODELS:62370}) was used to estimate his risk of developing {CA HX:54794}. This estimates his lifetime risk of developing {CA HX:54794} to be approximately ***%. This estimation does not consider any genetic testing results.  The patient's lifetime breast cancer risk is a preliminary estimate based on available information using one of several models endorsed by the American Cancer Society (ACS). The ACS recommends consideration of breast MRI screening as an adjunct to mammography for patients at high risk (defined as 20% or greater lifetime risk). Please note that a woman's breast cancer risk changes over time. It may increase or decrease based on age and any changes to the personal and/or family medical history. The risks and recommendations listed above apply to this patient at this point in  time. In the future, he may or may not be eligible for the same medical management strategies and, in some cases, other  medical management strategies may become available to her. If he is interested in an updated breast cancer risk assessment at a later date, he can contact us.  ***Andrew [name] has been determined to be at high risk for breast cancer.  Therefore, we recommend that annual screening with mammography and breast MRI be performed.  ***begin at age 16, or 10 years prior to the age of breast cancer diagnosis in a relative (whichever is earlier).  We discussed that Andrew [name] should discuss her individual situation with his referring physician and determine a breast cancer screening plan with which they are both comfortable.    PLAN: After considering the risks, benefits, and limitations, Mr. Coffer provided informed consent to pursue genetic testing and the blood sample was sent to {Lab} Laboratories for analysis of the {test}. Results should be available within approximately {TAT TIME} weeks' time, at which point they will be disclosed by telephone to Mr. Jemison, as will any additional recommendations warranted by these results. Mr. Cucco will receive a summary of his genetic counseling visit and a copy of his results once available. This information will also be available in Epic.   Lastly, we encouraged Mr. Sidor to remain in contact with cancer genetics annually so that we can continuously update the family history and inform him of any changes in cancer genetics and testing that may be of benefit for this family.   Mr. Percle questions were answered to his satisfaction today. Our contact information was provided should additional questions or concerns arise. Thank you for the referral and allowing [place] to share in the care of your patient.   Anahi Belmar P. Lowell Guitar, MS, [facility] Licensed, Patent attorney [email or web address] phone: [phone]  The patient was seen  for a total of *** minutes in face-to-face genetic counseling.  *** The patient was seen alone.  ***The patient brought ***. Drs. Meliton Rattan, and/or [place] were available for questions, if needed..    _______________________________________________________________________ For Office Staff:  Number of people involved in session: *** Was an Intern/ student involved with case: {YES/NO:63}

## 2023-01-20 ENCOUNTER — Encounter: Payer: Self-pay | Admitting: Genetic Counselor

## 2023-01-21 ENCOUNTER — Ambulatory Visit: Payer: Medicare Other | Admitting: Physical Therapy

## 2023-01-21 DIAGNOSIS — M6281 Muscle weakness (generalized): Secondary | ICD-10-CM | POA: Diagnosis not present

## 2023-01-21 DIAGNOSIS — R2681 Unsteadiness on feet: Secondary | ICD-10-CM

## 2023-01-21 NOTE — Therapy (Signed)
OUTPATIENT PHYSICAL THERAPY LOWER EXTREMITY TREATMENT   Patient Name: Andrew [name] MRN: [id number] DOB:Aug 05, 1950, 72 y.o., male Today's Date: 01/21/2023  END OF SESSION:  PT End of Session - 01/21/23 1206     Visit Number 5    Number of Visits 12    Date for PT Re-Evaluation 03/18/23    PT Start Time 1148    PT Stop Time 1231    PT Time Calculation (min) 43 min    Activity Tolerance Patient tolerated treatment well    Behavior During Therapy [facility] for tasks assessed/performed             Past Medical History:  Diagnosis Date   Acute viral pericarditis 09/03/2018   Inferior STE - but Negative Troponin.  CP &SOB.  Felt to be Viral.   Anemia    Arthritis    Bladder cancer (HCC) 2014   CAD (coronary artery disease)    Cataract    bilateral   Complication of anesthesia    Depression    Diabetes mellitus without complication (HCC)    Essential hypertension 01/20/2018   in the past no longer on medication   Family history of breast cancer    Family history of prostate cancer    Family history of testicular cancer    GERD (gastroesophageal reflux disease)    Heart attack (HCC) 03/2022   Hx of adenomatous colonic polyps 09/2017   colonoscopy   Hyperlipidemia with target LDL less than 70 12/19/2015   Intraoperative floppy iris syndrome (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 DES PCI RI, PTCA of RPDA 01/21/2018   12/2017 - Cath for ? STEMI -> distal/apical LAD & m-dCx CTO (unable to cross Cx).  Mod rPDA. Severe RI - DES PCI.  08/2018 - Cath for ? Inf STEMI - RI stent patent & CTO dLAD/mCx. Progression of rPDA 95% -> PTCA only.  Thought to be Pericarditis & not MI (troponin negative).    Neuromuscular disorder (HCC)    Sleep apnea    BiPAP not currently being used   Status post tendon repair 1989   STEMI (ST elevation myocardial infarction) (HCC) 01/20/2018   Cardiac cath January 21, 2018: Severe multivessel disease with unclear lesion,  but opted for PCI/DES x1 to the RI.  Appeared to have CTO of the distal LAD and circumflex as well as moderate mid LAD and diagonal disease as well as PDA.Marland Kitchen  Unable to cross circumflex lesion.   STEMI (ST elevation myocardial infarction) (HCC) 03/2022   Stroke (HCC) 11/2015   At times pt has dizziness with loss of vision   Tremors of nervous system 2011   Past Surgical History:  Procedure Laterality Date   APPENDECTOMY     BLADDER SURGERY     COLONOSCOPY     CORONARY STENT INTERVENTION N/A 01/21/2018   Procedure: CORONARY STENT INTERVENTION;  Surgeon: Marykay Lex, MD;  Location: [facility] INVASIVE CV LAB;  Service: Cardiovascular;  Laterality: N/A;  95% Ramus Intermedius -PCI with synergy DES 2.25 mm x 12 mm postdilated 2.4 mm.   CORONARY/GRAFT ACUTE MI REVASCULARIZATION N/A 01/21/2018   Procedure: Coronary/Graft Acute MI Revascularization;  Surgeon: Marykay Lex, MD;  Location: [facility] INVASIVE CV LAB;  Service: Cardiovascular;  Laterality: N/A;  attempted revas to distal CFX;    CORONARY/GRAFT ACUTE MI REVASCULARIZATION N/A 09/04/2018   Procedure: Coronary/Graft Acute MI Revascularization;  Surgeon: Tonny Bollman, MD;  Location: [facility] INVASIVE CV LAB:: PTCA/POBA  of rPDA (2.0 mm balloon)   CYSTOSCOPY N/A 01/21/2020   Procedure: CYSTOSCOPY;  Surgeon: Malen Gauze, MD;  Location: [facility];  Service: Urology;  Laterality: N/A;   CYSTOSCOPY N/A 02/09/2021   Procedure: CYSTOSCOPY;  Surgeon: Malen Gauze, MD;  Location: [facility];  Service: Urology;  Laterality: N/A;   CYSTOSCOPY Left 11/08/2022   Procedure: CYSTOSCOPY;  Surgeon: Malen Gauze, MD;  Location: [facility];  Service: Urology;  Laterality: Left;   CYSTOSCOPY W/ RETROGRADES Bilateral 08/22/2015   Procedure: CYSTOSCOPY WITH RETROGRADE PYELOGRAM;  Surgeon: Bjorn Pippin, MD;  Location: [facility];  Service: Urology;  Laterality: Bilateral;   CYSTOSCOPY W/ RETROGRADES Bilateral 01/16/2016   Procedure: CYSTOSCOPY WITH RETROGRADE PYELOGRAM;   Surgeon: Bjorn Pippin, MD;  Location: [facility];  Service: Urology;  Laterality: Bilateral;   CYSTOSCOPY W/ RETROGRADES Bilateral 01/07/2017   Procedure: CYSTOSCOPY WITH RETROGRADE PYELOGRAM;  Surgeon: Bjorn Pippin, MD;  Location: [facility];  Service: Urology;  Laterality: Bilateral;   CYSTOSCOPY WITH BIOPSY N/A 08/22/2015   Procedure: CYSTOSCOPY WITH BLADDER BIOPSY;  Surgeon: Bjorn Pippin, MD;  Location: [facility];  Service: Urology;  Laterality: N/A;   CYSTOSCOPY WITH BIOPSY N/A 01/16/2016   Procedure: CYSTOSCOPY WITH BLADDER BIOPSY;  Surgeon: Bjorn Pippin, MD;  Location: [facility];  Service: Urology;  Laterality: N/A;   CYSTOSCOPY WITH FULGERATION N/A 01/16/2016   Procedure: CYSTOSCOPY WITH FULGERATION;  Surgeon: Bjorn Pippin, MD;  Location: [facility];  Service: Urology;  Laterality: N/A;   CYSTOSCOPY/RETROGRADE/URETEROSCOPY Left 09/06/2022   Procedure: CYSTOSCOPY/RETROGRADE/URETEROSCOPY/STENT PLACEMENT;  Surgeon: Malen Gauze, MD;  Location: [facility];  Service: Urology;  Laterality: Left;   KNEE ARTHROSCOPY Right 1989   LEFT HEART CATH AND CORONARY ANGIOGRAPHY N/A 01/21/2018   Procedure: LEFT HEART CATH AND CORONARY ANGIOGRAPHY;  Surgeon: Marykay Lex, MD;  Location: [facility] INVASIVE CV LAB;  Service: Cardiovascular;; 95% RI-DES PCI.  80% o-pCX (PTCA) -> dCX 80%-100% CTO (unsuccessful PTCA unable to cross distal lesion with balloon.)  100% CTO dLAD. RPDA 80% and 70% as well as P AV 180%, PA V2 60%.  (too small for PCI)   LEFT HEART CATH AND CORONARY ANGIOGRAPHY N/A 09/04/2018   Procedure: LEFT HEART CATH AND CORONARY ANGIOGRAPHY;  Surgeon: Tonny Bollman, MD;  Location: [facility] INVASIVE CV LAB: (presumed Inferior STEMI) = progression of small rPDA -95% (PTCA only). Patent RI stent. CTO of apical LAD & mCx.   ROBOT ASSITED LAPAROSCOPIC NEPHROURETERECTOMY Left 11/08/2022   Procedure: XI ROBOT ASSITED LAPAROSCOPIC NEPHROURETERECTOMY;  Surgeon: Malen Gauze, MD;  Location: [facility];  Service: Urology;  Laterality: Left;    ROTATOR CUFF REPAIR Bilateral 2000   rt. leg fracture surgery     TRANSTHORACIC ECHOCARDIOGRAM  01/21/2018   Mild LVH.  EF 50 and 55%.  Apical inferior hypokinesis.  Mid-apical anterolateral hypokinesis.  Normal diastolic function for age    TRANSTHORACIC ECHOCARDIOGRAM  09/04/2018   -? Inf STEMI vs. Pericarditis:  Mildly reduced EF of 45 to 50%.  Impaired relaxation-GR 1 DD.  Severe hypokinesis of the anterolateral and inferolateral wall.  Small-moderate anterior pericardial effusion.  Moderate aortic sclerosis but no stenosis.   TRANSURETHRAL RESECTION OF BLADDER TUMOR N/A 01/07/2017   Procedure: TRANSURETHRAL RESECTION OF BLADDER TUMOR (TURBT);  Surgeon: Bjorn Pippin, MD;  Location: [facility];  Service: Urology;  Laterality: N/A;   TRANSURETHRAL RESECTION OF BLADDER TUMOR N/A 01/21/2020   Procedure: TRANSURETHRAL RESECTION OF BLADDER TUMOR (TURBT);  Surgeon: Malen Gauze, MD;  Location: [facility];  Service:  Urology;  Laterality: N/A;   TRANSURETHRAL RESECTION OF BLADDER TUMOR N/A 02/09/2021   Procedure: TRANSURETHRAL RESECTION OF BLADDER TUMOR (TURBT);  Surgeon: Malen Gauze, MD;  Location: [facility];  Service: Urology;  Laterality: N/A;   URETERAL BIOPSY Left 09/06/2022   Procedure: URETERAL BIOPSY/fulgeration;  Surgeon: Malen Gauze, MD;  Location: [facility];  Service: Urology;  Laterality: Left;   WISDOM TOOTH EXTRACTION     Patient Active Problem List   Diagnosis Date Noted   Family history of prostate cancer    Family history of testicular cancer    Family history of breast cancer    Ureteral cancer, 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 DES PCI RI, PTCA of RPDA  01/21/2018   Stable angina 01/20/2018   Depression 01/20/2018   Essential hypertension 01/20/2018   Sleep apnea 10/06/2016   Tobacco use disorder 12/19/2015   Type 2 diabetes mellitus with circulatory disorder, without long-term current use of insulin (HCC) 12/19/2015   Hyperlipidemia with target LDL less than 70 12/19/2015   Bladder cancer (HCC) 12/19/2015   Esophageal reflux 12/19/2015   Essential tremor 12/19/2015   History of stroke 12/19/2015    PCP: Stevphen Rochester, MD  REFERRING PROVIDER: Malen Gauze, MD   REFERRING DIAG: Left renal mass; Weakness   THERAPY DIAG:  Muscle weakness (generalized)  Unsteadiness on feet  Rationale for Evaluation and Treatment: Rehabilitation  ONSET DATE: 4 years ago   SUBJECTIVE:   SUBJECTIVE STATEMENT:  Pretty tired yesterday.  Doing okay today.  PERTINENT HISTORY: Diabetes type 2, angina, hypertension, COPD, essential tremor, history of cancer, history of CVA, history of MI, depression, and arthritis PAIN:  Are you having pain? Yes: NPRS scale: 5/10 Pain location: low back and both legs  Pain description: aching Aggravating factors: prolonged standing and sitting (5 minutes)  Relieving factors: medication  PRECAUTIONS: Fall  RED FLAGS: None   WEIGHT BEARING RESTRICTIONS: No  FALLS:  Has patient fallen in last 6 months? Yes. Number of falls 3-4; one was in December 2023 when he fell and hit his head when he tripped in his bedroom   LIVING ENVIRONMENT: Lives with: lives alone Lives in: House/apartment Stairs: No Has following equipment at home: Ramped entry  OCCUPATION: retired  PLOF: Independent with basic ADLs  PATIENT GOALS: improved strength, balance, and be able to pick up sticks in his yard  NEXT MD VISIT: 03/02/23  OBJECTIVE:   COGNITION: Overall cognitive status: Within functional limits for tasks assessed     SENSATION: Light touch: Impaired with diminished sensation in both lower  extremities with no dermatomal pattern Patient reports numbness in both lower extremities  EDEMA:  No edema observed  POSTURE: rounded shoulders, forward head, and flexed trunk   LOWER EXTREMITY ROM: WFL for activities assessed  LOWER EXTREMITY MMT:  MMT Right eval Left eval  Hip flexion 4-/5 4-/5  Hip extension    Hip abduction    Hip adduction    Hip internal rotation    Hip external rotation    Knee flexion 4/5 4/5  Knee extension 3+/5 4+/5  Ankle dorsiflexion 3/5 3/5  Ankle plantarflexion    Ankle inversion  Ankle eversion     (Blank rows = not tested)  FUNCTIONAL TESTS:  5 times sit to stand: 1:08.69 seconds with significant upper extremity support from the armrests Timed up and go (TUG): 22.54 seconds 30 seconds sit to stand test: 2 repetitions   GAIT: Assistive device utilized: None Level of assistance: Complete Independence Comments: Flexed trunk with decreased stride length and poor foot clearance bilaterally   TODAY'S TREATMENT:                                                                                                                              DATE:                                     01-21-23                                    EXERCISE LOG  Exercise Repetitions and Resistance Comments  Nustep L4 x 20 mins   Blue XTS resisted walking 4 way (2 minute each way)   Knee ext 10# x 3 minutes   Ham curls 30# x 3 minutes   Rockerboard in parallel bars 2 minutes for/back and 2 minutes side to side     PATIENT EDUCATION:  Education details: Plan of care, healing, prognosis, and goals for therapy Person educated: Patient Education method: Explanation Education comprehension: verbalized understanding  HOME EXERCISE PROGRAM:   ASSESSMENT:  CLINICAL IMPRESSION:  Patient is very motivated and did well with new interventions such as resisted walking with CGA.  He performed resisted weight machines with excellent technique and without  complaints.  OBJECTIVE IMPAIRMENTS: Abnormal gait, decreased activity tolerance, decreased balance, decreased mobility, difficulty walking, decreased strength, impaired sensation, postural dysfunction, and pain.   ACTIVITY LIMITATIONS: carrying, lifting, standing, stairs, transfers, and locomotion level  PARTICIPATION LIMITATIONS: cleaning, laundry, shopping, community activity, and yard work  PERSONAL FACTORS: Age, Past/current experiences, Time since onset of injury/illness/exacerbation, and 3+ comorbidities: Diabetes type 2, angina, hypertension, COPD, essential tremor, history of cancer, history of CVA, history of MI, depression, and arthritis  are also affecting patient's functional outcome.   REHAB POTENTIAL: Good  CLINICAL DECISION MAKING: Evolving/moderate complexity  EVALUATION COMPLEXITY: Moderate   GOALS: Goals reviewed with patient? Yes  SHORT TERM GOALS: Target date: 01/26/23 Patient will be independent with his initial HEP. Baseline: Goal status: INITIAL  2.  Patient will improve his 5 times sit to stand time to 45 seconds or less for improved lower extremity power. Baseline:  Goal status: INITIAL  3.  Patient will improve his timed up and go time to 16 seconds or less for improved functional mobility. Baseline:  Goal status: INITIAL  LONG TERM GOALS: Target date: 02/16/23  Patient will be independent with his advanced HEP. Baseline:  Goal status: INITIAL  2.  Patient will be able to safely pick up items from the floor for improved household independence. Baseline:  Goal status: INITIAL  3.  Patient will improve his 5 times sit to stand time to 35 seconds or less for improved lower extremity power. Baseline:  Goal status: INITIAL  4.  Patient will improve his timed up and go time to 12 seconds or less to reduce his fall risk. Baseline:  Goal status: INITIAL  5.  Patient will be able to safely transfer from sitting to standing with minimal to no upper  extremity support. Baseline:  Goal status: INITIAL  PLAN:  PT FREQUENCY: 2x/week  PT DURATION: 6 weeks  PLANNED INTERVENTIONS: Therapeutic exercises, Therapeutic activity, Neuromuscular re-education, Balance training, Gait training, Patient/Family education, Self Care, Stair training, and Re-evaluation  PLAN FOR NEXT SESSION: NuStep, lower extremity strengthening, and balance interventions  Tanyia Grabbe, [place], PT 01/21/2023, 12:50 PM

## 2023-01-25 ENCOUNTER — Encounter: Payer: Self-pay | Admitting: Genetic Counselor

## 2023-01-25 ENCOUNTER — Ambulatory Visit: Payer: Medicare Other | Attending: Urology

## 2023-01-25 DIAGNOSIS — M6281 Muscle weakness (generalized): Secondary | ICD-10-CM | POA: Insufficient documentation

## 2023-01-25 DIAGNOSIS — R2681 Unsteadiness on feet: Secondary | ICD-10-CM | POA: Insufficient documentation

## 2023-01-25 DIAGNOSIS — K635 Polyp of colon: Secondary | ICD-10-CM

## 2023-01-25 HISTORY — DX: Polyp of colon: K63.5

## 2023-01-25 NOTE — Therapy (Signed)
OUTPATIENT PHYSICAL THERAPY LOWER EXTREMITY TREATMENT   Patient Name: Andrew [name] MRN: [id number] DOB:10/13/1950, 72 y.o., male Today's Date: 01/25/2023  END OF SESSION:  PT End of Session - 01/25/23 1351     Visit Number 6    Number of Visits 12    Date for PT Re-Evaluation 03/18/23    PT Start Time 1345    PT Stop Time 1430    PT Time Calculation (min) 45 min    Activity Tolerance Patient tolerated treatment well    Behavior During Therapy [facility] for tasks assessed/performed             Past Medical History:  Diagnosis Date   Acute viral pericarditis 09/03/2018   Inferior STE - but Negative Troponin.  CP &SOB.  Felt to be Viral.   Anemia    Arthritis    Bladder cancer (HCC) 2014   CAD (coronary artery disease)    Cataract    bilateral   Complication of anesthesia    Depression    Diabetes mellitus without complication (HCC)    Essential hypertension 01/20/2018   in the past no longer on medication   Family history of breast cancer    Family history of prostate cancer    Family history of testicular cancer    GERD (gastroesophageal reflux disease)    Heart attack (HCC) 03/2022   Hx of adenomatous colonic polyps 09/2017   colonoscopy   Hyperlipidemia with target LDL less than 70 12/19/2015   Intraoperative floppy iris syndrome (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 DES PCI RI, PTCA of RPDA 01/21/2018   12/2017 - Cath for ? STEMI -> distal/apical LAD & m-dCx CTO (unable to cross Cx).  Mod rPDA. Severe RI - DES PCI.  08/2018 - Cath for ? Inf STEMI - RI stent patent & CTO dLAD/mCx. Progression of rPDA 95% -> PTCA only.  Thought to be Pericarditis & not MI (troponin negative).    Neuromuscular disorder (HCC)    Polyposis of colon 01/25/2023   Sleep apnea    BiPAP not currently being used   Status post tendon repair 1989   STEMI (ST elevation myocardial infarction) (HCC) 01/20/2018   Cardiac cath January 21, 2018: Severe  multivessel disease with unclear lesion, but opted for PCI/DES x1 to the RI.  Appeared to have CTO of the distal LAD and circumflex as well as moderate mid LAD and diagonal disease as well as PDA.Marland Kitchen  Unable to cross circumflex lesion.   STEMI (ST elevation myocardial infarction) (HCC) 03/2022   Stroke (HCC) 11/2015   At times pt has dizziness with loss of vision   Tremors of nervous system 2011   Past Surgical History:  Procedure Laterality Date   APPENDECTOMY     BLADDER SURGERY     COLONOSCOPY     CORONARY STENT INTERVENTION N/A 01/21/2018   Procedure: CORONARY STENT INTERVENTION;  Surgeon: Marykay Lex, MD;  Location: [facility] INVASIVE CV LAB;  Service: Cardiovascular;  Laterality: N/A;  95% Ramus Intermedius -PCI with synergy DES 2.25 mm x 12 mm postdilated 2.4 mm.   CORONARY/GRAFT ACUTE MI REVASCULARIZATION N/A 01/21/2018   Procedure: Coronary/Graft Acute MI Revascularization;  Surgeon: Marykay Lex, MD;  Location: [facility] INVASIVE CV LAB;  Service: Cardiovascular;  Laterality: N/A;  attempted revas to distal CFX;    CORONARY/GRAFT ACUTE MI REVASCULARIZATION N/A 09/04/2018   Procedure: Coronary/Graft Acute MI Revascularization;  Surgeon: Tonny Bollman, MD;  Location: [facility] INVASIVE CV LAB:: PTCA/POBA of rPDA (2.0 mm balloon)   CYSTOSCOPY N/A 01/21/2020   Procedure: CYSTOSCOPY;  Surgeon: Malen Gauze, MD;  Location: [facility];  Service: Urology;  Laterality: N/A;   CYSTOSCOPY N/A 02/09/2021   Procedure: CYSTOSCOPY;  Surgeon: Malen Gauze, MD;  Location: [facility];  Service: Urology;  Laterality: N/A;   CYSTOSCOPY Left 11/08/2022   Procedure: CYSTOSCOPY;  Surgeon: Malen Gauze, MD;  Location: [facility];  Service: Urology;  Laterality: Left;   CYSTOSCOPY W/ RETROGRADES Bilateral 08/22/2015   Procedure: CYSTOSCOPY WITH RETROGRADE PYELOGRAM;  Surgeon: Bjorn Pippin, MD;  Location: [facility];  Service: Urology;  Laterality: Bilateral;   CYSTOSCOPY W/ RETROGRADES Bilateral 01/16/2016   Procedure:  CYSTOSCOPY WITH RETROGRADE PYELOGRAM;  Surgeon: Bjorn Pippin, MD;  Location: [facility];  Service: Urology;  Laterality: Bilateral;   CYSTOSCOPY W/ RETROGRADES Bilateral 01/07/2017   Procedure: CYSTOSCOPY WITH RETROGRADE PYELOGRAM;  Surgeon: Bjorn Pippin, MD;  Location: [facility];  Service: Urology;  Laterality: Bilateral;   CYSTOSCOPY WITH BIOPSY N/A 08/22/2015   Procedure: CYSTOSCOPY WITH BLADDER BIOPSY;  Surgeon: Bjorn Pippin, MD;  Location: [facility];  Service: Urology;  Laterality: N/A;   CYSTOSCOPY WITH BIOPSY N/A 01/16/2016   Procedure: CYSTOSCOPY WITH BLADDER BIOPSY;  Surgeon: Bjorn Pippin, MD;  Location: [facility];  Service: Urology;  Laterality: N/A;   CYSTOSCOPY WITH FULGERATION N/A 01/16/2016   Procedure: CYSTOSCOPY WITH FULGERATION;  Surgeon: Bjorn Pippin, MD;  Location: [facility];  Service: Urology;  Laterality: N/A;   CYSTOSCOPY/RETROGRADE/URETEROSCOPY Left 09/06/2022   Procedure: CYSTOSCOPY/RETROGRADE/URETEROSCOPY/STENT PLACEMENT;  Surgeon: Malen Gauze, MD;  Location: [facility];  Service: Urology;  Laterality: Left;   KNEE ARTHROSCOPY Right 1989   LEFT HEART CATH AND CORONARY ANGIOGRAPHY N/A 01/21/2018   Procedure: LEFT HEART CATH AND CORONARY ANGIOGRAPHY;  Surgeon: Marykay Lex, MD;  Location: [facility] INVASIVE CV LAB;  Service: Cardiovascular;; 95% RI-DES PCI.  80% o-pCX (PTCA) -> dCX 80%-100% CTO (unsuccessful PTCA unable to cross distal lesion with balloon.)  100% CTO dLAD. RPDA 80% and 70% as well as P AV 180%, PA V2 60%.  (too small for PCI)   LEFT HEART CATH AND CORONARY ANGIOGRAPHY N/A 09/04/2018   Procedure: LEFT HEART CATH AND CORONARY ANGIOGRAPHY;  Surgeon: Tonny Bollman, MD;  Location: [facility] INVASIVE CV LAB: (presumed Inferior STEMI) = progression of small rPDA -95% (PTCA only). Patent RI stent. CTO of apical LAD & mCx.   ROBOT ASSITED LAPAROSCOPIC NEPHROURETERECTOMY Left 11/08/2022   Procedure: XI ROBOT ASSITED LAPAROSCOPIC NEPHROURETERECTOMY;  Surgeon: Malen Gauze, MD;  Location: [facility];   Service: Urology;  Laterality: Left;   ROTATOR CUFF REPAIR Bilateral 2000   rt. leg fracture surgery     TRANSTHORACIC ECHOCARDIOGRAM  01/21/2018   Mild LVH.  EF 50 and 55%.  Apical inferior hypokinesis.  Mid-apical anterolateral hypokinesis.  Normal diastolic function for age    TRANSTHORACIC ECHOCARDIOGRAM  09/04/2018   -? Inf STEMI vs. Pericarditis:  Mildly reduced EF of 45 to 50%.  Impaired relaxation-GR 1 DD.  Severe hypokinesis of the anterolateral and inferolateral wall.  Small-moderate anterior pericardial effusion.  Moderate aortic sclerosis but no stenosis.   TRANSURETHRAL RESECTION OF BLADDER TUMOR N/A 01/07/2017   Procedure: TRANSURETHRAL RESECTION OF BLADDER TUMOR (TURBT);  Surgeon: Bjorn Pippin, MD;  Location: [facility];  Service: Urology;  Laterality: N/A;   TRANSURETHRAL RESECTION OF BLADDER TUMOR N/A 01/21/2020   Procedure: TRANSURETHRAL RESECTION OF BLADDER TUMOR (TURBT);  Surgeon: Malen Gauze, MD;  Location: [facility];  Service: Urology;  Laterality: N/A;   TRANSURETHRAL RESECTION OF BLADDER TUMOR N/A 02/09/2021   Procedure: TRANSURETHRAL RESECTION OF BLADDER TUMOR (TURBT);  Surgeon: Malen Gauze, MD;  Location: [facility];  Service: Urology;  Laterality: N/A;   URETERAL BIOPSY Left 09/06/2022   Procedure: URETERAL BIOPSY/fulgeration;  Surgeon: Malen Gauze, MD;  Location: [facility];  Service: Urology;  Laterality: Left;   WISDOM TOOTH EXTRACTION     Patient Active Problem List   Diagnosis Date Noted   Polyposis of colon 01/25/2023   Family history of prostate cancer    Family history of testicular cancer    Family history of breast cancer    Ureteral cancer, 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 DES PCI RI, PTCA of RPDA 01/21/2018   Stable angina 01/20/2018   Depression 01/20/2018   Essential hypertension 01/20/2018   Sleep apnea 10/06/2016   Tobacco use disorder 12/19/2015   Type 2 diabetes mellitus with circulatory disorder, without long-term current use of insulin (HCC) 12/19/2015   Hyperlipidemia with target LDL less than 70 12/19/2015   Bladder cancer (HCC) 12/19/2015   Esophageal reflux 12/19/2015   Essential tremor 12/19/2015   History of stroke 12/19/2015    PCP: Stevphen Rochester, MD  REFERRING PROVIDER: Malen Gauze, MD   REFERRING DIAG: Left renal mass; Weakness   THERAPY DIAG:  Muscle weakness (generalized)  Unsteadiness on feet  Rationale for Evaluation and Treatment: Rehabilitation  ONSET DATE: 4 years ago   SUBJECTIVE:   SUBJECTIVE STATEMENT:  Pt reports 5/10 bil leg pain today.   PERTINENT HISTORY: Diabetes type 2, angina, hypertension, COPD, essential tremor, history of cancer, history of CVA, history of MI, depression, and arthritis PAIN:  Are you having pain? Yes: NPRS scale: 5/10 Pain location: low back and both legs  Pain description: aching Aggravating factors: prolonged standing and sitting (5 minutes)  Relieving factors: medication  PRECAUTIONS: Fall  RED FLAGS: None   WEIGHT BEARING RESTRICTIONS: No  FALLS:  Has patient fallen in last 6 months? Yes. Number of falls 3-4; one was in December 2023 when he fell and hit his head when he tripped in his bedroom   LIVING ENVIRONMENT: Lives with: lives alone Lives in: House/apartment Stairs: No Has following equipment at home: Ramped entry  OCCUPATION: retired  PLOF: Independent with basic ADLs  PATIENT GOALS: improved strength, balance, and be able to pick up sticks in his yard  NEXT MD VISIT: 03/02/23  OBJECTIVE:   COGNITION: Overall cognitive status: Within functional limits for tasks assessed     SENSATION: Light  touch: Impaired with diminished sensation in both lower extremities with no dermatomal pattern Patient reports numbness in both lower extremities  EDEMA:  No edema observed  POSTURE: rounded shoulders, forward head, and flexed trunk   LOWER EXTREMITY ROM: WFL for activities assessed  LOWER EXTREMITY MMT:  MMT Right eval Left eval  Hip flexion 4-/5 4-/5  Hip extension    Hip abduction    Hip adduction    Hip internal rotation    Hip external rotation    Knee flexion 4/5 4/5  Knee extension 3+/5 4+/5  Ankle dorsiflexion  3/5 3/5  Ankle plantarflexion    Ankle inversion    Ankle eversion     (Blank rows = not tested)  FUNCTIONAL TESTS:  5 times sit to stand: 1:08.69 seconds with significant upper extremity support from the armrests Timed up and go (TUG): 22.54 seconds 30 seconds sit to stand test: 2 repetitions   GAIT: Assistive device utilized: None Level of assistance: Complete Independence Comments: Flexed trunk with decreased stride length and poor foot clearance bilaterally   TODAY'S TREATMENT:                                                                                                                              DATE:                                     01/25/23                                    EXERCISE LOG  Exercise Repetitions and Resistance Comments  Nustep L4 x 15 mins   Blue XTS resisted walking 4 ways (2.5 mins each way)   Knee ext 10# x 3.5 minutes   Ham curls 30# x 3.5 minutes   Rockerboard in parallel bars 3 minutes for/back and 3 minutes side to side     PATIENT EDUCATION:  Education details: Plan of care, healing, prognosis, and goals for therapy Person educated: Patient Education method: Explanation Education comprehension: verbalized understanding  HOME EXERCISE PROGRAM:   ASSESSMENT:  CLINICAL IMPRESSION:  Pt arrives for today's treatment session reporting 5/10 bil LE pain.  Pt able to tolerate increased time with all exercises  today.  Pt able to perform 5 STS test in 34.01 seconds meeting both his short term and long term goals.  Pt also able to perform TUG in 15.6 seconds meeting his STG and making good progress towards his LTG.  Pt denied any change in pain at completion of today's treatment session.  OBJECTIVE IMPAIRMENTS: Abnormal gait, decreased activity tolerance, decreased balance, decreased mobility, difficulty walking, decreased strength, impaired sensation, postural dysfunction, and pain.   ACTIVITY LIMITATIONS: carrying, lifting, standing, stairs, transfers, and locomotion level  PARTICIPATION LIMITATIONS: cleaning, laundry, shopping, community activity, and yard work  PERSONAL FACTORS: Age, Past/current experiences, Time since onset of injury/illness/exacerbation, and 3+ comorbidities: Diabetes type 2, angina, hypertension, COPD, essential tremor, history of cancer, history of CVA, history of MI, depression, and arthritis  are also affecting patient's functional outcome.   REHAB POTENTIAL: Good  CLINICAL DECISION MAKING: Evolving/moderate complexity  EVALUATION COMPLEXITY: Moderate   GOALS: Goals reviewed with patient? Yes  SHORT TERM GOALS: Target date: 01/26/23 Patient will be independent with his initial HEP. Baseline: Goal status: MET  2.  Patient will improve his 5 times sit to stand time to 45 seconds  or less for improved lower extremity power. Baseline: 34.01 seconds Goal status: MET  3.  Patient will improve his timed up and go time to 16 seconds or less for improved functional mobility. Baseline: 15.6 seconds Goal status: MET  LONG TERM GOALS: Target date: 02/16/23  Patient will be independent with his advanced HEP. Baseline:  Goal status: IN PROGRESS  2.  Patient will be able to safely pick up items from the floor for improved household independence. Baseline:  Goal status: IN PROGRESS  3.  Patient will improve his 5 times sit to stand time to 35 seconds or less for improved  lower extremity power. Baseline: 34.01 seconds Goal status: MET  4.  Patient will improve his timed up and go time to 12 seconds or less to reduce his fall risk. Baseline: 9/3: 15.6 seconds Goal status: IN PROGRESS  5.  Patient will be able to safely transfer from sitting to standing with minimal to no upper extremity support. Baseline:  Goal status: PARTIALLY MET  PLAN:  PT FREQUENCY: 2x/week  PT DURATION: 6 weeks  PLANNED INTERVENTIONS: Therapeutic exercises, Therapeutic activity, Neuromuscular re-education, Balance training, Gait training, Patient/Family education, Self Care, Stair training, and Re-evaluation  PLAN FOR NEXT SESSION: NuStep, lower extremity strengthening, and balance interventions  Newman Pies, PTA 01/25/2023, 2:44 PM

## 2023-01-28 ENCOUNTER — Ambulatory Visit: Payer: Medicare Other | Admitting: Physical Therapy

## 2023-01-28 DIAGNOSIS — M6281 Muscle weakness (generalized): Secondary | ICD-10-CM | POA: Diagnosis not present

## 2023-01-28 DIAGNOSIS — R2681 Unsteadiness on feet: Secondary | ICD-10-CM

## 2023-01-28 NOTE — Therapy (Signed)
OUTPATIENT PHYSICAL THERAPY LOWER EXTREMITY TREATMENT   Patient Name: Andrew [name] MRN: [id number] DOB:08/10/1950, 72 y.o., male Today's Date: 01/28/2023  END OF SESSION:  PT End of Session - 01/28/23 1252     Visit Number 7    Number of Visits 12    Date for PT Re-Evaluation 03/18/23    PT Start Time 1145    PT Stop Time 1230    PT Time Calculation (min) 45 min    Activity Tolerance Patient tolerated treatment well    Behavior During Therapy [facility] for tasks assessed/performed             Past Medical History:  Diagnosis Date   Acute viral pericarditis 09/03/2018   Inferior STE - but Negative Troponin.  CP &SOB.  Felt to be Viral.   Anemia    Arthritis    Bladder cancer (HCC) 2014   CAD (coronary artery disease)    Cataract    bilateral   Complication of anesthesia    Depression    Diabetes mellitus without complication (HCC)    Essential hypertension 01/20/2018   in the past no longer on medication   Family history of breast cancer    Family history of prostate cancer    Family history of testicular cancer    GERD (gastroesophageal reflux disease)    Heart attack (HCC) 03/2022   Hx of adenomatous colonic polyps 09/2017   colonoscopy   Hyperlipidemia with target LDL less than 70 12/19/2015   Intraoperative floppy iris syndrome (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 DES PCI RI, PTCA of RPDA 01/21/2018   12/2017 - Cath for ? STEMI -> distal/apical LAD & m-dCx CTO (unable to cross Cx).  Mod rPDA. Severe RI - DES PCI.  08/2018 - Cath for ? Inf STEMI - RI stent patent & CTO dLAD/mCx. Progression of rPDA 95% -> PTCA only.  Thought to be Pericarditis & not MI (troponin negative).    Neuromuscular disorder (HCC)    Polyposis of colon 01/25/2023   Sleep apnea    BiPAP not currently being used   Status post tendon repair 1989   STEMI (ST elevation myocardial infarction) (HCC) 01/20/2018   Cardiac cath January 21, 2018: Severe  multivessel disease with unclear lesion, but opted for PCI/DES x1 to the RI.  Appeared to have CTO of the distal LAD and circumflex as well as moderate mid LAD and diagonal disease as well as PDA.Marland Kitchen  Unable to cross circumflex lesion.   STEMI (ST elevation myocardial infarction) (HCC) 03/2022   Stroke (HCC) 11/2015   At times pt has dizziness with loss of vision   Tremors of nervous system 2011   Past Surgical History:  Procedure Laterality Date   APPENDECTOMY     BLADDER SURGERY     COLONOSCOPY     CORONARY STENT INTERVENTION N/A 01/21/2018   Procedure: CORONARY STENT INTERVENTION;  Surgeon: Marykay Lex, MD;  Location: [facility] INVASIVE CV LAB;  Service: Cardiovascular;  Laterality: N/A;  95% Ramus Intermedius -PCI with synergy DES 2.25 mm x 12 mm postdilated 2.4 mm.   CORONARY/GRAFT ACUTE MI REVASCULARIZATION N/A 01/21/2018   Procedure: Coronary/Graft Acute MI Revascularization;  Surgeon: Marykay Lex, MD;  Location: [facility] INVASIVE CV LAB;  Service: Cardiovascular;  Laterality: N/A;  attempted revas to distal CFX;    CORONARY/GRAFT ACUTE MI REVASCULARIZATION N/A 09/04/2018   Procedure: Coronary/Graft Acute MI Revascularization;  Surgeon: Tonny Bollman, MD;  Location: [facility] INVASIVE CV LAB:: PTCA/POBA of rPDA (2.0 mm balloon)   CYSTOSCOPY N/A 01/21/2020   Procedure: CYSTOSCOPY;  Surgeon: Malen Gauze, MD;  Location: [facility];  Service: Urology;  Laterality: N/A;   CYSTOSCOPY N/A 02/09/2021   Procedure: CYSTOSCOPY;  Surgeon: Malen Gauze, MD;  Location: [facility];  Service: Urology;  Laterality: N/A;   CYSTOSCOPY Left 11/08/2022   Procedure: CYSTOSCOPY;  Surgeon: Malen Gauze, MD;  Location: [facility];  Service: Urology;  Laterality: Left;   CYSTOSCOPY W/ RETROGRADES Bilateral 08/22/2015   Procedure: CYSTOSCOPY WITH RETROGRADE PYELOGRAM;  Surgeon: Bjorn Pippin, MD;  Location: [facility];  Service: Urology;  Laterality: Bilateral;   CYSTOSCOPY W/ RETROGRADES Bilateral 01/16/2016   Procedure:  CYSTOSCOPY WITH RETROGRADE PYELOGRAM;  Surgeon: Bjorn Pippin, MD;  Location: [facility];  Service: Urology;  Laterality: Bilateral;   CYSTOSCOPY W/ RETROGRADES Bilateral 01/07/2017   Procedure: CYSTOSCOPY WITH RETROGRADE PYELOGRAM;  Surgeon: Bjorn Pippin, MD;  Location: [facility];  Service: Urology;  Laterality: Bilateral;   CYSTOSCOPY WITH BIOPSY N/A 08/22/2015   Procedure: CYSTOSCOPY WITH BLADDER BIOPSY;  Surgeon: Bjorn Pippin, MD;  Location: [facility];  Service: Urology;  Laterality: N/A;   CYSTOSCOPY WITH BIOPSY N/A 01/16/2016   Procedure: CYSTOSCOPY WITH BLADDER BIOPSY;  Surgeon: Bjorn Pippin, MD;  Location: [facility];  Service: Urology;  Laterality: N/A;   CYSTOSCOPY WITH FULGERATION N/A 01/16/2016   Procedure: CYSTOSCOPY WITH FULGERATION;  Surgeon: Bjorn Pippin, MD;  Location: [facility];  Service: Urology;  Laterality: N/A;   CYSTOSCOPY/RETROGRADE/URETEROSCOPY Left 09/06/2022   Procedure: CYSTOSCOPY/RETROGRADE/URETEROSCOPY/STENT PLACEMENT;  Surgeon: Malen Gauze, MD;  Location: [facility];  Service: Urology;  Laterality: Left;   KNEE ARTHROSCOPY Right 1989   LEFT HEART CATH AND CORONARY ANGIOGRAPHY N/A 01/21/2018   Procedure: LEFT HEART CATH AND CORONARY ANGIOGRAPHY;  Surgeon: Marykay Lex, MD;  Location: [facility] INVASIVE CV LAB;  Service: Cardiovascular;; 95% RI-DES PCI.  80% o-pCX (PTCA) -> dCX 80%-100% CTO (unsuccessful PTCA unable to cross distal lesion with balloon.)  100% CTO dLAD. RPDA 80% and 70% as well as P AV 180%, PA V2 60%.  (too small for PCI)   LEFT HEART CATH AND CORONARY ANGIOGRAPHY N/A 09/04/2018   Procedure: LEFT HEART CATH AND CORONARY ANGIOGRAPHY;  Surgeon: Tonny Bollman, MD;  Location: [facility] INVASIVE CV LAB: (presumed Inferior STEMI) = progression of small rPDA -95% (PTCA only). Patent RI stent. CTO of apical LAD & mCx.   ROBOT ASSITED LAPAROSCOPIC NEPHROURETERECTOMY Left 11/08/2022   Procedure: XI ROBOT ASSITED LAPAROSCOPIC NEPHROURETERECTOMY;  Surgeon: Malen Gauze, MD;  Location: [facility];   Service: Urology;  Laterality: Left;   ROTATOR CUFF REPAIR Bilateral 2000   rt. leg fracture surgery     TRANSTHORACIC ECHOCARDIOGRAM  01/21/2018   Mild LVH.  EF 50 and 55%.  Apical inferior hypokinesis.  Mid-apical anterolateral hypokinesis.  Normal diastolic function for age    TRANSTHORACIC ECHOCARDIOGRAM  09/04/2018   -? Inf STEMI vs. Pericarditis:  Mildly reduced EF of 45 to 50%.  Impaired relaxation-GR 1 DD.  Severe hypokinesis of the anterolateral and inferolateral wall.  Small-moderate anterior pericardial effusion.  Moderate aortic sclerosis but no stenosis.   TRANSURETHRAL RESECTION OF BLADDER TUMOR N/A 01/07/2017   Procedure: TRANSURETHRAL RESECTION OF BLADDER TUMOR (TURBT);  Surgeon: Bjorn Pippin, MD;  Location: [facility];  Service: Urology;  Laterality: N/A;   TRANSURETHRAL RESECTION OF BLADDER TUMOR N/A 01/21/2020   Procedure: TRANSURETHRAL RESECTION OF BLADDER TUMOR (TURBT);  Surgeon: Malen Gauze, MD;  Location: [facility];  Service: Urology;  Laterality: N/A;   TRANSURETHRAL RESECTION OF BLADDER TUMOR N/A 02/09/2021   Procedure: TRANSURETHRAL RESECTION OF BLADDER TUMOR (TURBT);  Surgeon: Malen Gauze, MD;  Location: [facility];  Service: Urology;  Laterality: N/A;   URETERAL BIOPSY Left 09/06/2022   Procedure: URETERAL BIOPSY/fulgeration;  Surgeon: Malen Gauze, MD;  Location: [facility];  Service: Urology;  Laterality: Left;   WISDOM TOOTH EXTRACTION     Patient Active Problem List   Diagnosis Date Noted   Polyposis of colon 01/25/2023   Family history of prostate cancer    Family history of testicular cancer    Family history of breast cancer    Ureteral cancer, 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 DES PCI RI, PTCA of RPDA 01/21/2018   Stable angina 01/20/2018   Depression 01/20/2018   Essential hypertension 01/20/2018   Sleep apnea 10/06/2016   Tobacco use disorder 12/19/2015   Type 2 diabetes mellitus with circulatory disorder, without long-term current use of insulin (HCC) 12/19/2015   Hyperlipidemia with target LDL less than 70 12/19/2015   Bladder cancer (HCC) 12/19/2015   Esophageal reflux 12/19/2015   Essential tremor 12/19/2015   History of stroke 12/19/2015    PCP: Stevphen Rochester, MD  REFERRING PROVIDER: Malen Gauze, MD   REFERRING DIAG: Left renal mass; Weakness   THERAPY DIAG:  Unsteadiness on feet  Muscle weakness (generalized)  Rationale for Evaluation and Treatment: Rehabilitation  ONSET DATE: 4 years ago   SUBJECTIVE:   SUBJECTIVE STATEMENT:  No new complaints.  PERTINENT HISTORY: Diabetes type 2, angina, hypertension, COPD, essential tremor, history of cancer, history of CVA, history of MI, depression, and arthritis PAIN:  Are you having pain? Yes: NPRS scale: 5/10 Pain location: low back and both legs  Pain description: aching Aggravating factors: prolonged standing and sitting (5 minutes)  Relieving factors: medication  PRECAUTIONS: Fall  RED FLAGS: None   WEIGHT BEARING RESTRICTIONS: No  FALLS:  Has patient fallen in last 6 months? Yes. Number of falls 3-4; one was in December 2023 when he fell and hit his head when he tripped in his bedroom   LIVING ENVIRONMENT: Lives with: lives alone Lives in: House/apartment Stairs: No Has following equipment at home: Ramped entry  OCCUPATION: retired  PLOF: Independent with basic ADLs  PATIENT GOALS: improved strength, balance, and be able to pick up sticks in his yard  NEXT MD VISIT: 03/02/23  OBJECTIVE:   COGNITION: Overall cognitive status: Within functional limits for tasks assessed     SENSATION: Light touch: Impaired  with diminished sensation in both lower extremities with no dermatomal pattern Patient reports numbness in both lower extremities  EDEMA:  No edema observed  POSTURE: rounded shoulders, forward head, and flexed trunk   LOWER EXTREMITY ROM: WFL for activities assessed  LOWER EXTREMITY MMT:  MMT Right eval Left eval  Hip flexion 4-/5 4-/5  Hip extension    Hip abduction    Hip adduction    Hip internal rotation    Hip external rotation    Knee flexion 4/5 4/5  Knee extension 3+/5 4+/5  Ankle dorsiflexion 3/5 3/5  Ankle plantarflexion  Ankle inversion    Ankle eversion     (Blank rows = not tested)  FUNCTIONAL TESTS:  5 times sit to stand: 1:08.69 seconds with significant upper extremity support from the armrests Timed up and go (TUG): 22.54 seconds 30 seconds sit to stand test: 2 repetitions   GAIT: Assistive device utilized: None Level of assistance: Complete Independence Comments: Flexed trunk with decreased stride length and poor foot clearance bilaterally   TODAY'S TREATMENT:                                                                                                                              DATE:    01/28/23:                                    EXERCISE LOG  Exercise Repetitions and Resistance Comments  Nustep  Level 4 x 20 minutes.   Knee ext 10# x 3 minutes   Ham curls 40# x 3 minutes   Leg press 2 plates x 3 minutes.   Rockerboard with UE support PRN 3 minutes forward and backward and 3 minutes side to side.    Blank cell = exercise not performed today                                         01/25/23                                    EXERCISE LOG  Exercise Repetitions and Resistance Comments  Nustep L4 x 15 mins   Blue XTS resisted walking 4 ways (2.5 mins each way)   Knee ext 10# x 3.5 minutes   Ham curls 30# x 3.5 minutes   Rockerboard in parallel bars 3 minutes for/back and 3 minutes side to side     PATIENT EDUCATION:  Education  details: Plan of care, healing, prognosis, and goals for therapy Person educated: Patient Education method: Explanation Education comprehension: verbalized understanding  HOME EXERCISE PROGRAM:   ASSESSMENT:  CLINICAL IMPRESSION:  Patient is highly motivated and doing well.  Added leg press exercise today which patient performed with excellent technique and no complaints.    OBJECTIVE IMPAIRMENTS: Abnormal gait, decreased activity tolerance, decreased balance, decreased mobility, difficulty walking, decreased strength, impaired sensation, postural dysfunction, and pain.   ACTIVITY LIMITATIONS: carrying, lifting, standing, stairs, transfers, and locomotion level  PARTICIPATION LIMITATIONS: cleaning, laundry, shopping, community activity, and yard work  PERSONAL FACTORS: Age, Past/current experiences, Time since onset of injury/illness/exacerbation, and 3+ comorbidities: Diabetes type 2, angina, hypertension, COPD, essential tremor, history of cancer, history of CVA, history of MI, depression, and arthritis  are also affecting patient's functional outcome.   REHAB POTENTIAL: Good  CLINICAL DECISION MAKING: Evolving/moderate complexity  EVALUATION COMPLEXITY: Moderate   GOALS: Goals reviewed with patient? Yes  SHORT TERM GOALS: Target date: 01/26/23 Patient will be independent with his initial HEP. Baseline: Goal status: MET  2.  Patient will improve his 5 times sit to stand time to 45 seconds or less for improved lower extremity power. Baseline: 34.01 seconds Goal status: MET  3.  Patient will improve his timed up and go time to 16 seconds or less for improved functional mobility. Baseline: 15.6 seconds Goal status: MET  LONG TERM GOALS: Target date: 02/16/23  Patient will be independent with his advanced HEP. Baseline:  Goal status: IN PROGRESS  2.  Patient will be able to safely pick up items from the floor for improved household independence. Baseline:  Goal status:  IN PROGRESS  3.  Patient will improve his 5 times sit to stand time to 35 seconds or less for improved lower extremity power. Baseline: 34.01 seconds Goal status: MET  4.  Patient will improve his timed up and go time to 12 seconds or less to reduce his fall risk. Baseline: 9/3: 15.6 seconds Goal status: IN PROGRESS  5.  Patient will be able to safely transfer from sitting to standing with minimal to no upper extremity support. Baseline:  Goal status: PARTIALLY MET  PLAN:  PT FREQUENCY: 2x/week  PT DURATION: 6 weeks  PLANNED INTERVENTIONS: Therapeutic exercises, Therapeutic activity, Neuromuscular re-education, Balance training, Gait training, Patient/Family education, Self Care, Stair training, and Re-evaluation  PLAN FOR NEXT SESSION: NuStep, lower extremity strengthening, and balance interventions  Jakel Alphin, [place], PT 01/28/2023, 1:13 PM

## 2023-02-01 ENCOUNTER — Ambulatory Visit: Payer: Medicare Other | Admitting: Physical Therapy

## 2023-02-01 DIAGNOSIS — R2681 Unsteadiness on feet: Secondary | ICD-10-CM

## 2023-02-01 DIAGNOSIS — M6281 Muscle weakness (generalized): Secondary | ICD-10-CM

## 2023-02-01 NOTE — Therapy (Signed)
OUTPATIENT PHYSICAL THERAPY LOWER EXTREMITY TREATMENT   Patient Name: Andrew [name] MRN: [id number] DOB:[birth year], 72 y.o., male Today's Date: 02/01/2023  END OF SESSION:  PT End of Session - 02/01/23 1411     Visit Number 8    Number of Visits 12    Date for PT Re-Evaluation 03/18/23    PT Start Time 0152    PT Stop Time 0237    PT Time Calculation (min) 45 min    Activity Tolerance Patient tolerated treatment well    Behavior During Therapy [facility] for tasks assessed/performed              Past Medical History:  Diagnosis Date   Acute viral pericarditis 09/03/2018   Inferior STE - but Negative Troponin.  CP &SOB.  Felt to be Viral.   Anemia    Arthritis    Bladder cancer (HCC) 2014   CAD (coronary artery disease)    Cataract    bilateral   Complication of anesthesia    Depression    Diabetes mellitus without complication (HCC)    Essential hypertension 01/20/2018   in the past no longer on medication   Family history of breast cancer    Family history of prostate cancer    Family history of testicular cancer    GERD (gastroesophageal reflux disease)    Heart attack (HCC) 03/2022   Hx of adenomatous colonic polyps 09/2017   colonoscopy   Hyperlipidemia with target LDL less than 70 12/19/2015   Intraoperative floppy iris syndrome (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 DES PCI RI, PTCA of RPDA 01/21/2018   12/2017 - Cath for ? STEMI -> distal/apical LAD & m-dCx CTO (unable to cross Cx).  Mod rPDA. Severe RI - DES PCI.  08/2018 - Cath for ? Inf STEMI - RI stent patent & CTO dLAD/mCx. Progression of rPDA 95% -> PTCA only.  Thought to be Pericarditis & not MI (troponin negative).    Neuromuscular disorder (HCC)    Polyposis of colon 01/25/2023   Sleep apnea    BiPAP not currently being used   Status post tendon repair 1989   STEMI (ST elevation myocardial infarction) (HCC) 01/20/2018   Cardiac cath January 21, 2018: Severe  multivessel disease with unclear lesion, but opted for PCI/DES x1 to the RI.  Appeared to have CTO of the distal LAD and circumflex as well as moderate mid LAD and diagonal disease as well as PDA.Marland Kitchen  Unable to cross circumflex lesion.   STEMI (ST elevation myocardial infarction) (HCC) 03/2022   Stroke (HCC) 11/2015   At times pt has dizziness with loss of vision   Tremors of nervous system 2011   Past Surgical History:  Procedure Laterality Date   APPENDECTOMY     BLADDER SURGERY     COLONOSCOPY     CORONARY STENT INTERVENTION N/A 01/21/2018   Procedure: CORONARY STENT INTERVENTION;  Surgeon: Marykay Lex, MD;  Location: [facility] INVASIVE CV LAB;  Service: Cardiovascular;  Laterality: N/A;  95% Ramus Intermedius -PCI with synergy DES 2.25 mm x 12 mm postdilated 2.4 mm.   CORONARY/GRAFT ACUTE MI REVASCULARIZATION N/A 01/21/2018   Procedure: Coronary/Graft Acute MI Revascularization;  Surgeon: Marykay Lex, MD;  Location: [facility] INVASIVE CV LAB;  Service: Cardiovascular;  Laterality: N/A;  attempted revas to distal CFX;    CORONARY/GRAFT ACUTE MI REVASCULARIZATION N/A 09/04/2018   Procedure: Coronary/Graft Acute MI Revascularization;  Surgeon: Tonny Bollman, MD;  Location: [facility] INVASIVE CV LAB:: PTCA/POBA of rPDA (2.0 mm balloon)   CYSTOSCOPY N/A 01/21/2020   Procedure: CYSTOSCOPY;  Surgeon: Malen Gauze, MD;  Location: [facility];  Service: Urology;  Laterality: N/A;   CYSTOSCOPY N/A 02/09/2021   Procedure: CYSTOSCOPY;  Surgeon: Malen Gauze, MD;  Location: [facility];  Service: Urology;  Laterality: N/A;   CYSTOSCOPY Left 11/08/2022   Procedure: CYSTOSCOPY;  Surgeon: Malen Gauze, MD;  Location: [facility];  Service: Urology;  Laterality: Left;   CYSTOSCOPY W/ RETROGRADES Bilateral 08/22/2015   Procedure: CYSTOSCOPY WITH RETROGRADE PYELOGRAM;  Surgeon: Bjorn Pippin, MD;  Location: [facility];  Service: Urology;  Laterality: Bilateral;   CYSTOSCOPY W/ RETROGRADES Bilateral 01/16/2016   Procedure:  CYSTOSCOPY WITH RETROGRADE PYELOGRAM;  Surgeon: Bjorn Pippin, MD;  Location: [facility];  Service: Urology;  Laterality: Bilateral;   CYSTOSCOPY W/ RETROGRADES Bilateral 01/07/2017   Procedure: CYSTOSCOPY WITH RETROGRADE PYELOGRAM;  Surgeon: Bjorn Pippin, MD;  Location: [facility];  Service: Urology;  Laterality: Bilateral;   CYSTOSCOPY WITH BIOPSY N/A 08/22/2015   Procedure: CYSTOSCOPY WITH BLADDER BIOPSY;  Surgeon: Bjorn Pippin, MD;  Location: [facility];  Service: Urology;  Laterality: N/A;   CYSTOSCOPY WITH BIOPSY N/A 01/16/2016   Procedure: CYSTOSCOPY WITH BLADDER BIOPSY;  Surgeon: Bjorn Pippin, MD;  Location: [facility];  Service: Urology;  Laterality: N/A;   CYSTOSCOPY WITH FULGERATION N/A 01/16/2016   Procedure: CYSTOSCOPY WITH FULGERATION;  Surgeon: Bjorn Pippin, MD;  Location: [facility];  Service: Urology;  Laterality: N/A;   CYSTOSCOPY/RETROGRADE/URETEROSCOPY Left 09/06/2022   Procedure: CYSTOSCOPY/RETROGRADE/URETEROSCOPY/STENT PLACEMENT;  Surgeon: Malen Gauze, MD;  Location: [facility];  Service: Urology;  Laterality: Left;   KNEE ARTHROSCOPY Right 1989   LEFT HEART CATH AND CORONARY ANGIOGRAPHY N/A 01/21/2018   Procedure: LEFT HEART CATH AND CORONARY ANGIOGRAPHY;  Surgeon: Marykay Lex, MD;  Location: [facility] INVASIVE CV LAB;  Service: Cardiovascular;; 95% RI-DES PCI.  80% o-pCX (PTCA) -> dCX 80%-100% CTO (unsuccessful PTCA unable to cross distal lesion with balloon.)  100% CTO dLAD. RPDA 80% and 70% as well as P AV 180%, PA V2 60%.  (too small for PCI)   LEFT HEART CATH AND CORONARY ANGIOGRAPHY N/A 09/04/2018   Procedure: LEFT HEART CATH AND CORONARY ANGIOGRAPHY;  Surgeon: Tonny Bollman, MD;  Location: [facility] INVASIVE CV LAB: (presumed Inferior STEMI) = progression of small rPDA -95% (PTCA only). Patent RI stent. CTO of apical LAD & mCx.   ROBOT ASSITED LAPAROSCOPIC NEPHROURETERECTOMY Left 11/08/2022   Procedure: XI ROBOT ASSITED LAPAROSCOPIC NEPHROURETERECTOMY;  Surgeon: Malen Gauze, MD;  Location: [facility];   Service: Urology;  Laterality: Left;   ROTATOR CUFF REPAIR Bilateral 2000   rt. leg fracture surgery     TRANSTHORACIC ECHOCARDIOGRAM  01/21/2018   Mild LVH.  EF 50 and 55%.  Apical inferior hypokinesis.  Mid-apical anterolateral hypokinesis.  Normal diastolic function for age    TRANSTHORACIC ECHOCARDIOGRAM  09/04/2018   -? Inf STEMI vs. Pericarditis:  Mildly reduced EF of 45 to 50%.  Impaired relaxation-GR 1 DD.  Severe hypokinesis of the anterolateral and inferolateral wall.  Small-moderate anterior pericardial effusion.  Moderate aortic sclerosis but no stenosis.   TRANSURETHRAL RESECTION OF BLADDER TUMOR N/A 01/07/2017   Procedure: TRANSURETHRAL RESECTION OF BLADDER TUMOR (TURBT);  Surgeon: Bjorn Pippin, MD;  Location: [facility];  Service: Urology;  Laterality: N/A;   TRANSURETHRAL RESECTION OF BLADDER TUMOR N/A 01/21/2020   Procedure: TRANSURETHRAL RESECTION OF BLADDER TUMOR (TURBT);  Surgeon: Malen Gauze, MD;  Location: [facility];  Service: Urology;  Laterality: N/A;   TRANSURETHRAL RESECTION OF BLADDER TUMOR N/A 02/09/2021   Procedure: TRANSURETHRAL RESECTION OF BLADDER TUMOR (TURBT);  Surgeon: Malen Gauze, MD;  Location: [facility];  Service: Urology;  Laterality: N/A;   URETERAL BIOPSY Left 09/06/2022   Procedure: URETERAL BIOPSY/fulgeration;  Surgeon: Malen Gauze, MD;  Location: [facility];  Service: Urology;  Laterality: Left;   WISDOM TOOTH EXTRACTION     Patient Active Problem List   Diagnosis Date Noted   Polyposis of colon 01/25/2023   Family history of prostate cancer    Family history of testicular cancer    Family history of breast cancer    Ureteral cancer, 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 DES PCI RI, PTCA of RPDA 01/21/2018   Stable angina 01/20/2018   Depression 01/20/2018   Essential hypertension 01/20/2018   Sleep apnea 10/06/2016   Tobacco use disorder 12/19/2015   Type 2 diabetes mellitus with circulatory disorder, without long-term current use of insulin (HCC) 12/19/2015   Hyperlipidemia with target LDL less than 70 12/19/2015   Bladder cancer (HCC) 12/19/2015   Esophageal reflux 12/19/2015   Essential tremor 12/19/2015   History of stroke 12/19/2015    PCP: Stevphen Rochester, MD  REFERRING PROVIDER: Malen Gauze, MD   REFERRING DIAG: Left renal mass; Weakness   THERAPY DIAG:  Unsteadiness on feet  Muscle weakness (generalized)  Rationale for Evaluation and Treatment: Rehabilitation  ONSET DATE: 4 years ago   SUBJECTIVE:   SUBJECTIVE STATEMENT:  No new complaints.  PERTINENT HISTORY: Diabetes type 2, angina, hypertension, COPD, essential tremor, history of cancer, history of CVA, history of MI, depression, and arthritis PAIN:  Are you having pain? Yes: NPRS scale: 5/10 Pain location: low back and both legs  Pain description: aching Aggravating factors: prolonged standing and sitting (5 minutes)  Relieving factors: medication  PRECAUTIONS: Fall  RED FLAGS: None   WEIGHT BEARING RESTRICTIONS: No  FALLS:  Has patient fallen in last 6 months? Yes. Number of falls 3-4; one was in December 2023 when he fell and hit his head when he tripped in his bedroom   LIVING ENVIRONMENT: Lives with: lives alone Lives in: House/apartment Stairs: No Has following equipment at home: Ramped entry  OCCUPATION: retired  PLOF: Independent with basic ADLs  PATIENT GOALS: improved strength, balance, and be able to pick up sticks in his yard  NEXT MD VISIT: 03/02/23  OBJECTIVE:   COGNITION: Overall cognitive status: Within functional limits for tasks assessed     SENSATION: Light touch: Impaired  with diminished sensation in both lower extremities with no dermatomal pattern Patient reports numbness in both lower extremities  EDEMA:  No edema observed  POSTURE: rounded shoulders, forward head, and flexed trunk   LOWER EXTREMITY ROM: WFL for activities assessed  LOWER EXTREMITY MMT:  MMT Right eval Left eval  Hip flexion 4-/5 4-/5  Hip extension    Hip abduction    Hip adduction    Hip internal rotation    Hip external rotation    Knee flexion 4/5 4/5  Knee extension 3+/5 4+/5  Ankle dorsiflexion 3/5 3/5  Ankle plantarflexion  Ankle inversion    Ankle eversion     (Blank rows = not tested)  FUNCTIONAL TESTS:  5 times sit to stand: 1:08.69 seconds with significant upper extremity support from the armrests Timed up and go (TUG): 22.54 seconds 30 seconds sit to stand test: 2 repetitions   GAIT: Assistive device utilized: None Level of assistance: Complete Independence Comments: Flexed trunk with decreased stride length and poor foot clearance bilaterally   TODAY'S TREATMENT:                                                                                                                              DATE:    02/01/23                                     EXERCISE LOG  Exercise Repetitions and Resistance Comments  Nustep Level 4 x 20 minutes.   Knee ext 10# x 3 minutes   Ham curls 40# x 3 minutes   Rockerboard  3 minutes       Standing on ballance pad in parallel bars while performing ball catches x 3 minutes.   01/28/23:                                    EXERCISE LOG  Exercise Repetitions and Resistance Comments  Nustep  Level 4 x 20 minutes.   Knee ext 10# x 3 minutes   Ham curls 40# x 3 minutes   Leg press 2 plates x 3 minutes.   Rockerboard with UE support PRN 3 minutes forward and backward and 3 minutes side to side.    Blank cell = exercise not performed today                                         01/25/23                                     EXERCISE LOG  Exercise Repetitions and Resistance Comments  Nustep L4 x 15 mins   Blue XTS resisted walking 4 ways (2.5 mins each way)   Knee ext 10# x 3.5 minutes   Ham curls 30# x 3.5 minutes   Rockerboard in parallel bars 3 minutes for/back and 3 minutes side to side     PATIENT EDUCATION:  Education details: Plan of care, healing, prognosis, and goals for therapy Person educated: Patient Education method: Explanation Education comprehension: verbalized understanding  HOME EXERCISE PROGRAM:   ASSESSMENT:  CLINICAL IMPRESSION:  Patient remains highly motivated.  He did well with treatment today.  He did well with balance activity today  requiring hand usage on parallel bars only once over 3 minutes.  OBJECTIVE IMPAIRMENTS: Abnormal gait, decreased activity tolerance, decreased balance, decreased mobility, difficulty walking, decreased strength, impaired sensation, postural dysfunction, and pain.   ACTIVITY LIMITATIONS: carrying, lifting, standing, stairs, transfers, and locomotion level  PARTICIPATION LIMITATIONS: cleaning, laundry, shopping, community activity, and yard work  PERSONAL FACTORS: Age, Past/current experiences, Time since onset of injury/illness/exacerbation, and 3+ comorbidities: Diabetes type 2, angina, hypertension, COPD, essential tremor, history of cancer, history of CVA, history of MI, depression, and arthritis  are also affecting patient's functional outcome.   REHAB POTENTIAL: Good  CLINICAL DECISION MAKING: Evolving/moderate complexity  EVALUATION COMPLEXITY: Moderate   GOALS: Goals reviewed with patient? Yes  SHORT TERM GOALS: Target date: 01/26/23 Patient will be independent with his initial HEP. Baseline: Goal status: MET  2.  Patient will improve his 5 times sit to stand time to 45 seconds or less for improved lower extremity power. Baseline: 34.01 seconds Goal status: MET  3.  Patient will improve his timed up and go time to 16 seconds or  less for improved functional mobility. Baseline: 15.6 seconds Goal status: MET  LONG TERM GOALS: Target date: 02/16/23  Patient will be independent with his advanced HEP. Baseline:  Goal status: IN PROGRESS  2.  Patient will be able to safely pick up items from the floor for improved household independence. Baseline:  Goal status: IN PROGRESS  3.  Patient will improve his 5 times sit to stand time to 35 seconds or less for improved lower extremity power. Baseline: 34.01 seconds Goal status: MET  4.  Patient will improve his timed up and go time to 12 seconds or less to reduce his fall risk. Baseline: 9/3: 15.6 seconds Goal status: IN PROGRESS  5.  Patient will be able to safely transfer from sitting to standing with minimal to no upper extremity support. Baseline:  Goal status: PARTIALLY MET  PLAN:  PT FREQUENCY: 2x/week  PT DURATION: 6 weeks  PLANNED INTERVENTIONS: Therapeutic exercises, Therapeutic activity, Neuromuscular re-education, Balance training, Gait training, Patient/Family education, Self Care, Stair training, and Re-evaluation  PLAN FOR NEXT SESSION: NuStep, lower extremity strengthening, and balance interventions  Lopez Dentinger, [place], PT 02/01/2023, 3:09 PM

## 2023-02-04 ENCOUNTER — Ambulatory Visit: Payer: Medicare Other

## 2023-02-04 DIAGNOSIS — M6281 Muscle weakness (generalized): Secondary | ICD-10-CM | POA: Diagnosis not present

## 2023-02-04 DIAGNOSIS — R2681 Unsteadiness on feet: Secondary | ICD-10-CM

## 2023-02-04 NOTE — Therapy (Signed)
OUTPATIENT PHYSICAL THERAPY LOWER EXTREMITY TREATMENT   Patient Name: Andrew [name] MRN: [id number] DOB:09/18/1950, 72 y.o., male Today's Date: 02/04/2023  END OF SESSION:  PT End of Session - 02/04/23 1155     Visit Number 9    Number of Visits 12    Date for PT Re-Evaluation 03/18/23    PT Start Time 1150    PT Stop Time 1230    PT Time Calculation (min) 40 min    Activity Tolerance Patient tolerated treatment well    Behavior During Therapy [facility] for tasks assessed/performed              Past Medical History:  Diagnosis Date   Acute viral pericarditis 09/03/2018   Inferior STE - but Negative Troponin.  CP &SOB.  Felt to be Viral.   Anemia    Arthritis    Bladder cancer (HCC) 2014   CAD (coronary artery disease)    Cataract    bilateral   Complication of anesthesia    Depression    Diabetes mellitus without complication (HCC)    Essential hypertension 01/20/2018   in the past no longer on medication   Family history of breast cancer    Family history of prostate cancer    Family history of testicular cancer    GERD (gastroesophageal reflux disease)    Heart attack (HCC) 03/2022   Hx of adenomatous colonic polyps 09/2017   colonoscopy   Hyperlipidemia with target LDL less than 70 12/19/2015   Intraoperative floppy iris syndrome (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 DES PCI RI, PTCA of RPDA 01/21/2018   12/2017 - Cath for ? STEMI -> distal/apical LAD & m-dCx CTO (unable to cross Cx).  Mod rPDA. Severe RI - DES PCI.  08/2018 - Cath for ? Inf STEMI - RI stent patent & CTO dLAD/mCx. Progression of rPDA 95% -> PTCA only.  Thought to be Pericarditis & not MI (troponin negative).    Neuromuscular disorder (HCC)    Polyposis of colon 01/25/2023   Sleep apnea    BiPAP not currently being used   Status post tendon repair 1989   STEMI (ST elevation myocardial infarction) (HCC) 01/20/2018   Cardiac cath January 21, 2018: Severe  multivessel disease with unclear lesion, but opted for PCI/DES x1 to the RI.  Appeared to have CTO of the distal LAD and circumflex as well as moderate mid LAD and diagonal disease as well as PDA.Marland Kitchen  Unable to cross circumflex lesion.   STEMI (ST elevation myocardial infarction) (HCC) 03/2022   Stroke (HCC) 11/2015   At times pt has dizziness with loss of vision   Tremors of nervous system 2011   Past Surgical History:  Procedure Laterality Date   APPENDECTOMY     BLADDER SURGERY     COLONOSCOPY     CORONARY STENT INTERVENTION N/A 01/21/2018   Procedure: CORONARY STENT INTERVENTION;  Surgeon: Marykay Lex, MD;  Location: [facility] INVASIVE CV LAB;  Service: Cardiovascular;  Laterality: N/A;  95% Ramus Intermedius -PCI with synergy DES 2.25 mm x 12 mm postdilated 2.4 mm.   CORONARY/GRAFT ACUTE MI REVASCULARIZATION N/A 01/21/2018   Procedure: Coronary/Graft Acute MI Revascularization;  Surgeon: Marykay Lex, MD;  Location: [facility] INVASIVE CV LAB;  Service: Cardiovascular;  Laterality: N/A;  attempted revas to distal CFX;    CORONARY/GRAFT ACUTE MI REVASCULARIZATION N/A 09/04/2018   Procedure: Coronary/Graft Acute MI Revascularization;  Surgeon: Tonny Bollman, MD;  Location: [facility] INVASIVE CV LAB:: PTCA/POBA of rPDA (2.0 mm balloon)   CYSTOSCOPY N/A 01/21/2020   Procedure: CYSTOSCOPY;  Surgeon: Malen Gauze, MD;  Location: [facility];  Service: Urology;  Laterality: N/A;   CYSTOSCOPY N/A 02/09/2021   Procedure: CYSTOSCOPY;  Surgeon: Malen Gauze, MD;  Location: [facility];  Service: Urology;  Laterality: N/A;   CYSTOSCOPY Left 11/08/2022   Procedure: CYSTOSCOPY;  Surgeon: Malen Gauze, MD;  Location: [facility];  Service: Urology;  Laterality: Left;   CYSTOSCOPY W/ RETROGRADES Bilateral 08/22/2015   Procedure: CYSTOSCOPY WITH RETROGRADE PYELOGRAM;  Surgeon: Bjorn Pippin, MD;  Location: [facility];  Service: Urology;  Laterality: Bilateral;   CYSTOSCOPY W/ RETROGRADES Bilateral 01/16/2016   Procedure:  CYSTOSCOPY WITH RETROGRADE PYELOGRAM;  Surgeon: Bjorn Pippin, MD;  Location: [facility];  Service: Urology;  Laterality: Bilateral;   CYSTOSCOPY W/ RETROGRADES Bilateral 01/07/2017   Procedure: CYSTOSCOPY WITH RETROGRADE PYELOGRAM;  Surgeon: Bjorn Pippin, MD;  Location: [facility];  Service: Urology;  Laterality: Bilateral;   CYSTOSCOPY WITH BIOPSY N/A 08/22/2015   Procedure: CYSTOSCOPY WITH BLADDER BIOPSY;  Surgeon: Bjorn Pippin, MD;  Location: [facility];  Service: Urology;  Laterality: N/A;   CYSTOSCOPY WITH BIOPSY N/A 01/16/2016   Procedure: CYSTOSCOPY WITH BLADDER BIOPSY;  Surgeon: Bjorn Pippin, MD;  Location: [facility];  Service: Urology;  Laterality: N/A;   CYSTOSCOPY WITH FULGERATION N/A 01/16/2016   Procedure: CYSTOSCOPY WITH FULGERATION;  Surgeon: Bjorn Pippin, MD;  Location: [facility];  Service: Urology;  Laterality: N/A;   CYSTOSCOPY/RETROGRADE/URETEROSCOPY Left 09/06/2022   Procedure: CYSTOSCOPY/RETROGRADE/URETEROSCOPY/STENT PLACEMENT;  Surgeon: Malen Gauze, MD;  Location: [facility];  Service: Urology;  Laterality: Left;   KNEE ARTHROSCOPY Right 1989   LEFT HEART CATH AND CORONARY ANGIOGRAPHY N/A 01/21/2018   Procedure: LEFT HEART CATH AND CORONARY ANGIOGRAPHY;  Surgeon: Marykay Lex, MD;  Location: [facility] INVASIVE CV LAB;  Service: Cardiovascular;; 95% RI-DES PCI.  80% o-pCX (PTCA) -> dCX 80%-100% CTO (unsuccessful PTCA unable to cross distal lesion with balloon.)  100% CTO dLAD. RPDA 80% and 70% as well as P AV 180%, PA V2 60%.  (too small for PCI)   LEFT HEART CATH AND CORONARY ANGIOGRAPHY N/A 09/04/2018   Procedure: LEFT HEART CATH AND CORONARY ANGIOGRAPHY;  Surgeon: Tonny Bollman, MD;  Location: [facility] INVASIVE CV LAB: (presumed Inferior STEMI) = progression of small rPDA -95% (PTCA only). Patent RI stent. CTO of apical LAD & mCx.   ROBOT ASSITED LAPAROSCOPIC NEPHROURETERECTOMY Left 11/08/2022   Procedure: XI ROBOT ASSITED LAPAROSCOPIC NEPHROURETERECTOMY;  Surgeon: Malen Gauze, MD;  Location: [facility];   Service: Urology;  Laterality: Left;   ROTATOR CUFF REPAIR Bilateral 2000   rt. leg fracture surgery     TRANSTHORACIC ECHOCARDIOGRAM  01/21/2018   Mild LVH.  EF 50 and 55%.  Apical inferior hypokinesis.  Mid-apical anterolateral hypokinesis.  Normal diastolic function for age    TRANSTHORACIC ECHOCARDIOGRAM  09/04/2018   -? Inf STEMI vs. Pericarditis:  Mildly reduced EF of 45 to 50%.  Impaired relaxation-GR 1 DD.  Severe hypokinesis of the anterolateral and inferolateral wall.  Small-moderate anterior pericardial effusion.  Moderate aortic sclerosis but no stenosis.   TRANSURETHRAL RESECTION OF BLADDER TUMOR N/A 01/07/2017   Procedure: TRANSURETHRAL RESECTION OF BLADDER TUMOR (TURBT);  Surgeon: Bjorn Pippin, MD;  Location: [facility];  Service: Urology;  Laterality: N/A;   TRANSURETHRAL RESECTION OF BLADDER TUMOR N/A 01/21/2020   Procedure: TRANSURETHRAL RESECTION OF BLADDER TUMOR (TURBT);  Surgeon: Malen Gauze, MD;  Location: [facility];  Service: Urology;  Laterality: N/A;   TRANSURETHRAL RESECTION OF BLADDER TUMOR N/A 02/09/2021   Procedure: TRANSURETHRAL RESECTION OF BLADDER TUMOR (TURBT);  Surgeon: Malen Gauze, MD;  Location: [facility];  Service: Urology;  Laterality: N/A;   URETERAL BIOPSY Left 09/06/2022   Procedure: URETERAL BIOPSY/fulgeration;  Surgeon: Malen Gauze, MD;  Location: [facility];  Service: Urology;  Laterality: Left;   WISDOM TOOTH EXTRACTION     Patient Active Problem List   Diagnosis Date Noted   Polyposis of colon 01/25/2023   Family history of prostate cancer    Family history of testicular cancer    Family history of breast cancer    Ureteral cancer, 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 DES PCI RI, PTCA of RPDA 01/21/2018   Stable angina 01/20/2018   Depression 01/20/2018   Essential hypertension 01/20/2018   Sleep apnea 10/06/2016   Tobacco use disorder 12/19/2015   Type 2 diabetes mellitus with circulatory disorder, without long-term current use of insulin (HCC) 12/19/2015   Hyperlipidemia with target LDL less than 70 12/19/2015   Bladder cancer (HCC) 12/19/2015   Esophageal reflux 12/19/2015   Essential tremor 12/19/2015   History of stroke 12/19/2015    PCP: Stevphen Rochester, MD  REFERRING PROVIDER: Malen Gauze, MD   REFERRING DIAG: Left renal mass; Weakness   THERAPY DIAG:  Unsteadiness on feet  Muscle weakness (generalized)  Rationale for Evaluation and Treatment: Rehabilitation  ONSET DATE: 4 years ago   SUBJECTIVE:   SUBJECTIVE STATEMENT:  Pt reports minimal bil leg pain today, but does report BLE fatigue due to yard work performed earlier today.   PERTINENT HISTORY: Diabetes type 2, angina, hypertension, COPD, essential tremor, history of cancer, history of CVA, history of MI, depression, and arthritis PAIN:  Are you having pain? Yes: NPRS scale: 2/10 Pain location: low back and both legs  Pain description: aching Aggravating factors: prolonged standing and sitting (5 minutes)  Relieving factors: medication  PRECAUTIONS: Fall  RED FLAGS: None   WEIGHT BEARING RESTRICTIONS: No  FALLS:  Has patient fallen in last 6 months? Yes. Number of falls 3-4; one was in December 2023 when he fell and hit his head when he tripped in his bedroom   LIVING ENVIRONMENT: Lives with: lives alone Lives in: House/apartment Stairs: No Has following equipment at home: Ramped entry  OCCUPATION: retired  PLOF: Independent with basic ADLs  PATIENT GOALS: improved strength, balance, and be able to pick up sticks in his yard  NEXT MD VISIT: 03/02/23  OBJECTIVE:   COGNITION: Overall cognitive  status: Within functional limits for tasks assessed     SENSATION: Light touch: Impaired with diminished sensation in both lower extremities with no dermatomal pattern Patient reports numbness in both lower extremities  EDEMA:  No edema observed  POSTURE: rounded shoulders, forward head, and flexed trunk   LOWER EXTREMITY ROM: WFL for activities assessed  LOWER EXTREMITY MMT:  MMT Right eval Left eval  Hip flexion 4-/5 4-/5  Hip extension    Hip abduction    Hip adduction    Hip internal rotation    Hip external rotation  Knee flexion 4/5 4/5  Knee extension 3+/5 4+/5  Ankle dorsiflexion 3/5 3/5  Ankle plantarflexion    Ankle inversion    Ankle eversion     (Blank rows = not tested)  FUNCTIONAL TESTS:  5 times sit to stand: 1:08.69 seconds with significant upper extremity support from the armrests Timed up and go (TUG): 22.54 seconds 30 seconds sit to stand test: 2 repetitions   GAIT: Assistive device utilized: None Level of assistance: Complete Independence Comments: Flexed trunk with decreased stride length and poor foot clearance bilaterally   TODAY'S TREATMENT:                                                                                                                              DATE:    02/04/23                                     EXERCISE LOG  Exercise Repetitions and Resistance Comments  Nustep Level 4 x 20 minutes.   Knee ext 10# x 3.5 minutes   Ham curls 40# x 3.5 minutes   Side-stepping Airex x 5 reps   Rockerboard  4 minutes   NBOS Ball toss x 4 mins                      PATIENT EDUCATION:  Education details: Plan of care, healing, prognosis, and goals for therapy Person educated: Patient Education method: Explanation Education comprehension: verbalized understanding  HOME EXERCISE PROGRAM:   ASSESSMENT:  CLINICAL IMPRESSION:  Pt arrives for today's treatment session reporting 2/10 BLE pain.  Pt able to tolerate increased time  with cybex exercises today with notable fatigue at completion of exercises.  Pt challenged by tossing ball with NBOS requiring intermittent CGA for correction and safety.  Pt denied any change in pain at completion of today's treatment session.  OBJECTIVE IMPAIRMENTS: Abnormal gait, decreased activity tolerance, decreased balance, decreased mobility, difficulty walking, decreased strength, impaired sensation, postural dysfunction, and pain.   ACTIVITY LIMITATIONS: carrying, lifting, standing, stairs, transfers, and locomotion level  PARTICIPATION LIMITATIONS: cleaning, laundry, shopping, community activity, and yard work  PERSONAL FACTORS: Age, Past/current experiences, Time since onset of injury/illness/exacerbation, and 3+ comorbidities: Diabetes type 2, angina, hypertension, COPD, essential tremor, history of cancer, history of CVA, history of MI, depression, and arthritis  are also affecting patient's functional outcome.   REHAB POTENTIAL: Good  CLINICAL DECISION MAKING: Evolving/moderate complexity  EVALUATION COMPLEXITY: Moderate   GOALS: Goals reviewed with patient? Yes  SHORT TERM GOALS: Target date: 01/26/23 Patient will be independent with his initial HEP. Baseline: Goal status: MET  2.  Patient will improve his 5 times sit to stand time to 45 seconds or less for improved lower extremity power. Baseline: 34.01 seconds Goal status: MET  3.  Patient will improve his timed up and go time to 16  seconds or less for improved functional mobility. Baseline: 15.6 seconds Goal status: MET  LONG TERM GOALS: Target date: 02/16/23  Patient will be independent with his advanced HEP. Baseline:  Goal status: IN PROGRESS  2.  Patient will be able to safely pick up items from the floor for improved household independence. Baseline:  Goal status: IN PROGRESS  3.  Patient will improve his 5 times sit to stand time to 35 seconds or less for improved lower extremity power. Baseline:  34.01 seconds Goal status: MET  4.  Patient will improve his timed up and go time to 12 seconds or less to reduce his fall risk. Baseline: 9/3: 15.6 seconds Goal status: IN PROGRESS  5.  Patient will be able to safely transfer from sitting to standing with minimal to no upper extremity support. Baseline:  Goal status: PARTIALLY MET  PLAN:  PT FREQUENCY: 2x/week  PT DURATION: 6 weeks  PLANNED INTERVENTIONS: Therapeutic exercises, Therapeutic activity, Neuromuscular re-education, Balance training, Gait training, Patient/Family education, Self Care, Stair training, and Re-evaluation  PLAN FOR NEXT SESSION: NuStep, lower extremity strengthening, and balance interventions  Newman Pies, PTA 02/04/2023, 12:39 PM

## 2023-02-07 ENCOUNTER — Encounter: Payer: Self-pay | Admitting: Gastroenterology

## 2023-02-08 ENCOUNTER — Ambulatory Visit: Payer: Medicare Other

## 2023-02-08 DIAGNOSIS — M6281 Muscle weakness (generalized): Secondary | ICD-10-CM | POA: Diagnosis not present

## 2023-02-08 DIAGNOSIS — R2681 Unsteadiness on feet: Secondary | ICD-10-CM

## 2023-02-08 NOTE — Therapy (Addendum)
OUTPATIENT PHYSICAL THERAPY LOWER EXTREMITY TREATMENT   Patient Name: Andrew [name] MRN: [id number] DOB:1950/12/31, 72 y.o., male Today's Date: 02/08/2023  END OF SESSION:  PT End of Session - 02/08/23 1349     Visit Number 10    Number of Visits 12    Date for PT Re-Evaluation 03/18/23    PT Start Time 1345    PT Stop Time 1430    PT Time Calculation (min) 45 min    Activity Tolerance Patient tolerated treatment well    Behavior During Therapy [facility] for tasks assessed/performed              Past Medical History:  Diagnosis Date   Acute viral pericarditis 09/03/2018   Inferior STE - but Negative Troponin.  CP &SOB.  Felt to be Viral.   Anemia    Arthritis    Bladder cancer (HCC) 2014   CAD (coronary artery disease)    Cataract    bilateral   Complication of anesthesia    Depression    Diabetes mellitus without complication (HCC)    Essential hypertension 01/20/2018   in the past no longer on medication   Family history of breast cancer    Family history of prostate cancer    Family history of testicular cancer    GERD (gastroesophageal reflux disease)    Heart attack (HCC) 03/2022   Hx of adenomatous colonic polyps 09/2017   colonoscopy   Hyperlipidemia with target LDL less than 70 12/19/2015   Intraoperative floppy iris syndrome (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 DES PCI RI, PTCA of RPDA 01/21/2018   12/2017 - Cath for ? STEMI -> distal/apical LAD & m-dCx CTO (unable to cross Cx).  Mod rPDA. Severe RI - DES PCI.  08/2018 - Cath for ? Inf STEMI - RI stent patent & CTO dLAD/mCx. Progression of rPDA 95% -> PTCA only.  Thought to be Pericarditis & not MI (troponin negative).    Neuromuscular disorder (HCC)    Polyposis of colon 01/25/2023   Sleep apnea    BiPAP not currently being used   Status post tendon repair 1989   STEMI (ST elevation myocardial infarction) (HCC) 01/20/2018   Cardiac cath January 21, 2018: Severe  multivessel disease with unclear lesion, but opted for PCI/DES x1 to the RI.  Appeared to have CTO of the distal LAD and circumflex as well as moderate mid LAD and diagonal disease as well as PDA.Marland Kitchen  Unable to cross circumflex lesion.   STEMI (ST elevation myocardial infarction) (HCC) 03/2022   Stroke (HCC) 11/2015   At times pt has dizziness with loss of vision   Tremors of nervous system 2011   Past Surgical History:  Procedure Laterality Date   APPENDECTOMY     BLADDER SURGERY     COLONOSCOPY     CORONARY STENT INTERVENTION N/A 01/21/2018   Procedure: CORONARY STENT INTERVENTION;  Surgeon: Marykay Lex, MD;  Location: [facility] INVASIVE CV LAB;  Service: Cardiovascular;  Laterality: N/A;  95% Ramus Intermedius -PCI with synergy DES 2.25 mm x 12 mm postdilated 2.4 mm.   CORONARY/GRAFT ACUTE MI REVASCULARIZATION N/A 01/21/2018   Procedure: Coronary/Graft Acute MI Revascularization;  Surgeon: Marykay Lex, MD;  Location: [facility] INVASIVE CV LAB;  Service: Cardiovascular;  Laterality: N/A;  attempted revas to distal CFX;    CORONARY/GRAFT ACUTE MI REVASCULARIZATION N/A 09/04/2018   Procedure: Coronary/Graft Acute MI Revascularization;  Surgeon: Tonny Bollman, MD;  Location: [facility] INVASIVE CV LAB:: PTCA/POBA of rPDA (2.0 mm balloon)   CYSTOSCOPY N/A 01/21/2020   Procedure: CYSTOSCOPY;  Surgeon: Malen Gauze, MD;  Location: [facility];  Service: Urology;  Laterality: N/A;   CYSTOSCOPY N/A 02/09/2021   Procedure: CYSTOSCOPY;  Surgeon: Malen Gauze, MD;  Location: [facility];  Service: Urology;  Laterality: N/A;   CYSTOSCOPY Left 11/08/2022   Procedure: CYSTOSCOPY;  Surgeon: Malen Gauze, MD;  Location: [facility];  Service: Urology;  Laterality: Left;   CYSTOSCOPY W/ RETROGRADES Bilateral 08/22/2015   Procedure: CYSTOSCOPY WITH RETROGRADE PYELOGRAM;  Surgeon: Bjorn Pippin, MD;  Location: [facility];  Service: Urology;  Laterality: Bilateral;   CYSTOSCOPY W/ RETROGRADES Bilateral 01/16/2016   Procedure:  CYSTOSCOPY WITH RETROGRADE PYELOGRAM;  Surgeon: Bjorn Pippin, MD;  Location: [facility];  Service: Urology;  Laterality: Bilateral;   CYSTOSCOPY W/ RETROGRADES Bilateral 01/07/2017   Procedure: CYSTOSCOPY WITH RETROGRADE PYELOGRAM;  Surgeon: Bjorn Pippin, MD;  Location: [facility];  Service: Urology;  Laterality: Bilateral;   CYSTOSCOPY WITH BIOPSY N/A 08/22/2015   Procedure: CYSTOSCOPY WITH BLADDER BIOPSY;  Surgeon: Bjorn Pippin, MD;  Location: [facility];  Service: Urology;  Laterality: N/A;   CYSTOSCOPY WITH BIOPSY N/A 01/16/2016   Procedure: CYSTOSCOPY WITH BLADDER BIOPSY;  Surgeon: Bjorn Pippin, MD;  Location: [facility];  Service: Urology;  Laterality: N/A;   CYSTOSCOPY WITH FULGERATION N/A 01/16/2016   Procedure: CYSTOSCOPY WITH FULGERATION;  Surgeon: Bjorn Pippin, MD;  Location: [facility];  Service: Urology;  Laterality: N/A;   CYSTOSCOPY/RETROGRADE/URETEROSCOPY Left 09/06/2022   Procedure: CYSTOSCOPY/RETROGRADE/URETEROSCOPY/STENT PLACEMENT;  Surgeon: Malen Gauze, MD;  Location: [facility];  Service: Urology;  Laterality: Left;   KNEE ARTHROSCOPY Right 1989   LEFT HEART CATH AND CORONARY ANGIOGRAPHY N/A 01/21/2018   Procedure: LEFT HEART CATH AND CORONARY ANGIOGRAPHY;  Surgeon: Marykay Lex, MD;  Location: [facility] INVASIVE CV LAB;  Service: Cardiovascular;; 95% RI-DES PCI.  80% o-pCX (PTCA) -> dCX 80%-100% CTO (unsuccessful PTCA unable to cross distal lesion with balloon.)  100% CTO dLAD. RPDA 80% and 70% as well as P AV 180%, PA V2 60%.  (too small for PCI)   LEFT HEART CATH AND CORONARY ANGIOGRAPHY N/A 09/04/2018   Procedure: LEFT HEART CATH AND CORONARY ANGIOGRAPHY;  Surgeon: Tonny Bollman, MD;  Location: [facility] INVASIVE CV LAB: (presumed Inferior STEMI) = progression of small rPDA -95% (PTCA only). Patent RI stent. CTO of apical LAD & mCx.   ROBOT ASSITED LAPAROSCOPIC NEPHROURETERECTOMY Left 11/08/2022   Procedure: XI ROBOT ASSITED LAPAROSCOPIC NEPHROURETERECTOMY;  Surgeon: Malen Gauze, MD;  Location: [facility];   Service: Urology;  Laterality: Left;   ROTATOR CUFF REPAIR Bilateral 2000   rt. leg fracture surgery     TRANSTHORACIC ECHOCARDIOGRAM  01/21/2018   Mild LVH.  EF 50 and 55%.  Apical inferior hypokinesis.  Mid-apical anterolateral hypokinesis.  Normal diastolic function for age    TRANSTHORACIC ECHOCARDIOGRAM  09/04/2018   -? Inf STEMI vs. Pericarditis:  Mildly reduced EF of 45 to 50%.  Impaired relaxation-GR 1 DD.  Severe hypokinesis of the anterolateral and inferolateral wall.  Small-moderate anterior pericardial effusion.  Moderate aortic sclerosis but no stenosis.   TRANSURETHRAL RESECTION OF BLADDER TUMOR N/A 01/07/2017   Procedure: TRANSURETHRAL RESECTION OF BLADDER TUMOR (TURBT);  Surgeon: Bjorn Pippin, MD;  Location: [facility];  Service: Urology;  Laterality: N/A;   TRANSURETHRAL RESECTION OF BLADDER TUMOR N/A 01/21/2020   Procedure: TRANSURETHRAL RESECTION OF BLADDER TUMOR (TURBT);  Surgeon: Malen Gauze, MD;  Location: [facility];  Service: Urology;  Laterality: N/A;   TRANSURETHRAL RESECTION OF BLADDER TUMOR N/A 02/09/2021   Procedure: TRANSURETHRAL RESECTION OF BLADDER TUMOR (TURBT);  Surgeon: Malen Gauze, MD;  Location: [facility];  Service: Urology;  Laterality: N/A;   URETERAL BIOPSY Left 09/06/2022   Procedure: URETERAL BIOPSY/fulgeration;  Surgeon: Malen Gauze, MD;  Location: [facility];  Service: Urology;  Laterality: Left;   WISDOM TOOTH EXTRACTION     Patient Active Problem List   Diagnosis Date Noted   Polyposis of colon 01/25/2023   Family history of prostate cancer    Family history of testicular cancer    Family history of breast cancer    Ureteral cancer, 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 DES PCI RI, PTCA of RPDA 01/21/2018   Stable angina 01/20/2018   Depression 01/20/2018   Essential hypertension 01/20/2018   Sleep apnea 10/06/2016   Tobacco use disorder 12/19/2015   Type 2 diabetes mellitus with circulatory disorder, without long-term current use of insulin (HCC) 12/19/2015   Hyperlipidemia with target LDL less than 70 12/19/2015   Bladder cancer (HCC) 12/19/2015   Esophageal reflux 12/19/2015   Essential tremor 12/19/2015   History of stroke 12/19/2015    PCP: Stevphen Rochester, MD  REFERRING PROVIDER: Malen Gauze, MD   REFERRING DIAG: Left renal mass; Weakness   THERAPY DIAG:  Unsteadiness on feet  Muscle weakness (generalized)  Rationale for Evaluation and Treatment: Rehabilitation  ONSET DATE: 4 years ago   SUBJECTIVE:   SUBJECTIVE STATEMENT:  Pt reports 3/10 low back and BLE pain today.    PERTINENT HISTORY: Diabetes type 2, angina, hypertension, COPD, essential tremor, history of cancer, history of CVA, history of MI, depression, and arthritis PAIN:  Are you having pain? Yes: NPRS scale: 3/10 Pain location: low back and both legs  Pain description: aching Aggravating factors: prolonged standing and sitting (5 minutes)  Relieving factors: medication  PRECAUTIONS: Fall  RED FLAGS: None   WEIGHT BEARING RESTRICTIONS: No  FALLS:  Has patient fallen in last 6 months? Yes. Number of falls 3-4; one was in December 2023 when he fell and hit his head when he tripped in his bedroom   LIVING ENVIRONMENT: Lives with: lives alone Lives in: House/apartment Stairs: No Has following equipment at home: Ramped entry  OCCUPATION: retired  PLOF: Independent with basic ADLs  PATIENT GOALS: improved strength, balance, and be able to pick up sticks in his yard  NEXT MD VISIT: 03/02/23  OBJECTIVE:   COGNITION: Overall cognitive status: Within functional limits for tasks  assessed     SENSATION: Light touch: Impaired with diminished sensation in both lower extremities with no dermatomal pattern Patient reports numbness in both lower extremities  EDEMA:  No edema observed  POSTURE: rounded shoulders, forward head, and flexed trunk   LOWER EXTREMITY ROM: WFL for activities assessed  LOWER EXTREMITY MMT:  MMT Right eval Left eval  Hip flexion 4-/5 4-/5  Hip extension    Hip abduction    Hip adduction    Hip internal rotation    Hip external rotation    Knee flexion 4/5 4/5  Knee extension 3+/5 4+/5  Ankle dorsiflexion 3/5 3/5  Ankle plantarflexion    Ankle inversion    Ankle eversion     (Blank rows = not tested)  FUNCTIONAL TESTS:  5 times sit to stand: 1:08.69 seconds with significant upper extremity support from the armrests Timed up and go (TUG): 22.54 seconds 30 seconds sit to stand test: 2 repetitions   GAIT: Assistive device utilized: None Level of assistance: Complete Independence Comments: Flexed trunk with decreased stride length and poor foot clearance bilaterally   TODAY'S TREATMENT:                                                                                                                              DATE:    02/08/23                                     EXERCISE LOG  Exercise Repetitions and Resistance Comments  Nustep Level 4 x 20 minutes.   Knee ext 10# x 4 minutes   Ham curls 40# x 4 minutes   Leg Press 2 plates; seat 9; 4 mins   Side-stepping Airex x 5 reps   Rockerboard  4 minutes   NBOS                       PATIENT EDUCATION:  Education details: Plan of care, healing, prognosis, and goals for therapy Person educated: Patient Education method: Explanation Education comprehension: verbalized understanding  HOME EXERCISE PROGRAM:   ASSESSMENT:  CLINICAL IMPRESSION:  Pt arrives for today's treatment session reporting 3/10 bil leg pain.  Pt able to tolerate increased time with all cybex exercises  today.  Today's treatment concentrating on strengthening exercises with good results.  Pt reports fatigue at completion of today's treatment session, but denies any pain.  Pt is making good progress towards all of his goals at this time.   Pt would benefit from strength and balance at next treatment session.    02/08/23 PROGRESS REPORT:  Patient is making good progress with skilled physical therapy as evidenced by his objective measures, functional mobility, and progress toward his goals. He was able to meet all of his short term and his long term five time sit to stand goals. However, he continues to exhibit reduced lower extremity power with functional activities such as sit to stand transfers. Recommend that he continue with his current plan of care to address his remaining impairments to maximize his functional mobility.   Candi Leash, PT, DPT   OBJECTIVE IMPAIRMENTS: Abnormal gait, decreased activity tolerance, decreased balance, decreased mobility, difficulty walking, decreased strength, impaired sensation, postural dysfunction, and pain.   ACTIVITY LIMITATIONS: carrying, lifting, standing, stairs, transfers, and locomotion level  PARTICIPATION LIMITATIONS: cleaning, laundry, shopping, community activity, and yard work  PERSONAL FACTORS: Age, Past/current experiences, Time since onset of injury/illness/exacerbation, and 3+ comorbidities: Diabetes type 2, angina,  hypertension, COPD, essential tremor, history of cancer, history of CVA, history of MI, depression, and arthritis  are also affecting patient's functional outcome.   REHAB POTENTIAL: Good  CLINICAL DECISION MAKING: Evolving/moderate complexity  EVALUATION COMPLEXITY: Moderate   GOALS: Goals reviewed with patient? Yes  SHORT TERM GOALS: Target date: 01/26/23 Patient will be independent with his initial HEP. Baseline: Goal status: MET  2.  Patient will improve his 5 times sit to stand time to 45 seconds or less for improved  lower extremity power. Baseline: 34.01 seconds Goal status: MET  3.  Patient will improve his timed up and go time to 16 seconds or less for improved functional mobility. Baseline: 15.6 seconds Goal status: MET  LONG TERM GOALS: Target date: 02/16/23  Patient will be independent with his advanced HEP. Baseline:  Goal status: IN PROGRESS  2.  Patient will be able to safely pick up items from the floor for improved household independence. Baseline:  Goal status: IN PROGRESS  3.  Patient will improve his 5 times sit to stand time to 35 seconds or less for improved lower extremity power. Baseline: 34.01 seconds Goal status: MET  4.  Patient will improve his timed up and go time to 12 seconds or less to reduce his fall risk. Baseline: 9/3: 15.6 seconds Goal status: IN PROGRESS  5.  Patient will be able to safely transfer from sitting to standing with minimal to no upper extremity support. Baseline:  Goal status: PARTIALLY MET  PLAN:  PT FREQUENCY: 2x/week  PT DURATION: 6 weeks  PLANNED INTERVENTIONS: Therapeutic exercises, Therapeutic activity, Neuromuscular re-education, Balance training, Gait training, Patient/Family education, Self Care, Stair training, and Re-evaluation  PLAN FOR NEXT SESSION: NuStep, lower extremity strengthening, and balance interventions  Newman Pies, PTA 02/08/2023, 3:22 PM

## 2023-02-10 ENCOUNTER — Encounter: Payer: Self-pay | Admitting: Genetic Counselor

## 2023-02-10 ENCOUNTER — Telehealth: Payer: Self-pay | Admitting: Genetic Counselor

## 2023-02-10 DIAGNOSIS — Z1379 Encounter for other screening for genetic and chromosomal anomalies: Secondary | ICD-10-CM | POA: Insufficient documentation

## 2023-02-10 NOTE — Telephone Encounter (Signed)
Mailbox is full and cannot accept messages.  I will send a mychart message.

## 2023-02-11 ENCOUNTER — Telehealth: Payer: Self-pay | Admitting: Genetic Counselor

## 2023-02-11 ENCOUNTER — Ambulatory Visit: Payer: Medicare Other

## 2023-02-11 DIAGNOSIS — M6281 Muscle weakness (generalized): Secondary | ICD-10-CM | POA: Diagnosis not present

## 2023-02-11 DIAGNOSIS — R2681 Unsteadiness on feet: Secondary | ICD-10-CM

## 2023-02-11 NOTE — Therapy (Signed)
OUTPATIENT PHYSICAL THERAPY LOWER EXTREMITY TREATMENT   Patient Name: Andrew [name] MRN: [id number] DOB:1951/01/05, 72 y.o., male Today's Date: 02/11/2023  END OF SESSION:  PT End of Session - 02/11/23 1150     Visit Number 11    Number of Visits 12    Date for PT Re-Evaluation 03/18/23    PT Start Time 1145    PT Stop Time 1230    PT Time Calculation (min) 45 min    Activity Tolerance Patient tolerated treatment well    Behavior During Therapy [facility] for tasks assessed/performed              Past Medical History:  Diagnosis Date   Acute viral pericarditis 09/03/2018   Inferior STE - but Negative Troponin.  CP &SOB.  Felt to be Viral.   Anemia    Arthritis    Bladder cancer (HCC) 2014   CAD (coronary artery disease)    Cataract    bilateral   Complication of anesthesia    Depression    Diabetes mellitus without complication (HCC)    Essential hypertension 01/20/2018   in the past no longer on medication   Family history of breast cancer    Family history of prostate cancer    Family history of testicular cancer    GERD (gastroesophageal reflux disease)    Heart attack (HCC) 03/2022   Hx of adenomatous colonic polyps 09/2017   colonoscopy   Hyperlipidemia with target LDL less than 70 12/19/2015   Intraoperative floppy iris syndrome (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 DES PCI RI, PTCA of RPDA 01/21/2018   12/2017 - Cath for ? STEMI -> distal/apical LAD & m-dCx CTO (unable to cross Cx).  Mod rPDA. Severe RI - DES PCI.  08/2018 - Cath for ? Inf STEMI - RI stent patent & CTO dLAD/mCx. Progression of rPDA 95% -> PTCA only.  Thought to be Pericarditis & not MI (troponin negative).    Neuromuscular disorder (HCC)    Polyposis of colon 01/25/2023   Sleep apnea    BiPAP not currently being used   Status post tendon repair 1989   STEMI (ST elevation myocardial infarction) (HCC) 01/20/2018   Cardiac cath January 21, 2018: Severe  multivessel disease with unclear lesion, but opted for PCI/DES x1 to the RI.  Appeared to have CTO of the distal LAD and circumflex as well as moderate mid LAD and diagonal disease as well as PDA.Marland Kitchen  Unable to cross circumflex lesion.   STEMI (ST elevation myocardial infarction) (HCC) 03/2022   Stroke (HCC) 11/2015   At times pt has dizziness with loss of vision   Tremors of nervous system 2011   Past Surgical History:  Procedure Laterality Date   APPENDECTOMY     BLADDER SURGERY     COLONOSCOPY     CORONARY STENT INTERVENTION N/A 01/21/2018   Procedure: CORONARY STENT INTERVENTION;  Surgeon: Marykay Lex, MD;  Location: [facility] INVASIVE CV LAB;  Service: Cardiovascular;  Laterality: N/A;  95% Ramus Intermedius -PCI with synergy DES 2.25 mm x 12 mm postdilated 2.4 mm.   CORONARY/GRAFT ACUTE MI REVASCULARIZATION N/A 01/21/2018   Procedure: Coronary/Graft Acute MI Revascularization;  Surgeon: Marykay Lex, MD;  Location: [facility] INVASIVE CV LAB;  Service: Cardiovascular;  Laterality: N/A;  attempted revas to distal CFX;    CORONARY/GRAFT ACUTE MI REVASCULARIZATION N/A 09/04/2018   Procedure: Coronary/Graft Acute MI Revascularization;  Surgeon: Tonny Bollman, MD;  Location: [facility] INVASIVE CV LAB:: PTCA/POBA of rPDA (2.0 mm balloon)   CYSTOSCOPY N/A 01/21/2020   Procedure: CYSTOSCOPY;  Surgeon: Malen Gauze, MD;  Location: [facility];  Service: Urology;  Laterality: N/A;   CYSTOSCOPY N/A 02/09/2021   Procedure: CYSTOSCOPY;  Surgeon: Malen Gauze, MD;  Location: [facility];  Service: Urology;  Laterality: N/A;   CYSTOSCOPY Left 11/08/2022   Procedure: CYSTOSCOPY;  Surgeon: Malen Gauze, MD;  Location: [facility];  Service: Urology;  Laterality: Left;   CYSTOSCOPY W/ RETROGRADES Bilateral 08/22/2015   Procedure: CYSTOSCOPY WITH RETROGRADE PYELOGRAM;  Surgeon: Bjorn Pippin, MD;  Location: [facility];  Service: Urology;  Laterality: Bilateral;   CYSTOSCOPY W/ RETROGRADES Bilateral 01/16/2016   Procedure:  CYSTOSCOPY WITH RETROGRADE PYELOGRAM;  Surgeon: Bjorn Pippin, MD;  Location: [facility];  Service: Urology;  Laterality: Bilateral;   CYSTOSCOPY W/ RETROGRADES Bilateral 01/07/2017   Procedure: CYSTOSCOPY WITH RETROGRADE PYELOGRAM;  Surgeon: Bjorn Pippin, MD;  Location: [facility];  Service: Urology;  Laterality: Bilateral;   CYSTOSCOPY WITH BIOPSY N/A 08/22/2015   Procedure: CYSTOSCOPY WITH BLADDER BIOPSY;  Surgeon: Bjorn Pippin, MD;  Location: [facility];  Service: Urology;  Laterality: N/A;   CYSTOSCOPY WITH BIOPSY N/A 01/16/2016   Procedure: CYSTOSCOPY WITH BLADDER BIOPSY;  Surgeon: Bjorn Pippin, MD;  Location: [facility];  Service: Urology;  Laterality: N/A;   CYSTOSCOPY WITH FULGERATION N/A 01/16/2016   Procedure: CYSTOSCOPY WITH FULGERATION;  Surgeon: Bjorn Pippin, MD;  Location: [facility];  Service: Urology;  Laterality: N/A;   CYSTOSCOPY/RETROGRADE/URETEROSCOPY Left 09/06/2022   Procedure: CYSTOSCOPY/RETROGRADE/URETEROSCOPY/STENT PLACEMENT;  Surgeon: Malen Gauze, MD;  Location: [facility];  Service: Urology;  Laterality: Left;   KNEE ARTHROSCOPY Right 1989   LEFT HEART CATH AND CORONARY ANGIOGRAPHY N/A 01/21/2018   Procedure: LEFT HEART CATH AND CORONARY ANGIOGRAPHY;  Surgeon: Marykay Lex, MD;  Location: [facility] INVASIVE CV LAB;  Service: Cardiovascular;; 95% RI-DES PCI.  80% o-pCX (PTCA) -> dCX 80%-100% CTO (unsuccessful PTCA unable to cross distal lesion with balloon.)  100% CTO dLAD. RPDA 80% and 70% as well as P AV 180%, PA V2 60%.  (too small for PCI)   LEFT HEART CATH AND CORONARY ANGIOGRAPHY N/A 09/04/2018   Procedure: LEFT HEART CATH AND CORONARY ANGIOGRAPHY;  Surgeon: Tonny Bollman, MD;  Location: [facility] INVASIVE CV LAB: (presumed Inferior STEMI) = progression of small rPDA -95% (PTCA only). Patent RI stent. CTO of apical LAD & mCx.   ROBOT ASSITED LAPAROSCOPIC NEPHROURETERECTOMY Left 11/08/2022   Procedure: XI ROBOT ASSITED LAPAROSCOPIC NEPHROURETERECTOMY;  Surgeon: Malen Gauze, MD;  Location: [facility];   Service: Urology;  Laterality: Left;   ROTATOR CUFF REPAIR Bilateral 2000   rt. leg fracture surgery     TRANSTHORACIC ECHOCARDIOGRAM  01/21/2018   Mild LVH.  EF 50 and 55%.  Apical inferior hypokinesis.  Mid-apical anterolateral hypokinesis.  Normal diastolic function for age    TRANSTHORACIC ECHOCARDIOGRAM  09/04/2018   -? Inf STEMI vs. Pericarditis:  Mildly reduced EF of 45 to 50%.  Impaired relaxation-GR 1 DD.  Severe hypokinesis of the anterolateral and inferolateral wall.  Small-moderate anterior pericardial effusion.  Moderate aortic sclerosis but no stenosis.   TRANSURETHRAL RESECTION OF BLADDER TUMOR N/A 01/07/2017   Procedure: TRANSURETHRAL RESECTION OF BLADDER TUMOR (TURBT);  Surgeon: Bjorn Pippin, MD;  Location: [facility];  Service: Urology;  Laterality: N/A;   TRANSURETHRAL RESECTION OF BLADDER TUMOR N/A 01/21/2020   Procedure: TRANSURETHRAL RESECTION OF BLADDER TUMOR (TURBT);  Surgeon: Malen Gauze, MD;  Location: [facility];  Service: Urology;  Laterality: N/A;   TRANSURETHRAL RESECTION OF BLADDER TUMOR N/A 02/09/2021   Procedure: TRANSURETHRAL RESECTION OF BLADDER TUMOR (TURBT);  Surgeon: Malen Gauze, MD;  Location: [facility];  Service: Urology;  Laterality: N/A;   URETERAL BIOPSY Left 09/06/2022   Procedure: URETERAL BIOPSY/fulgeration;  Surgeon: Malen Gauze, MD;  Location: [facility];  Service: Urology;  Laterality: Left;   WISDOM TOOTH EXTRACTION     Patient Active Problem List   Diagnosis Date Noted   Genetic testing 02/10/2023   Polyposis of colon 01/25/2023   Family history of prostate cancer    Family history of testicular cancer    Family history of breast cancer    Ureteral cancer, left (HCC) 11/08/2022   Left renal mass 09/06/2022   History of pericarditis 04/26/2019   History of ST elevation myocardial infarction (STEMI) 09/05/2018   Unstable angina (HCC) 09/04/2018   Presence of drug coated stent in ramus intermedius coronary artery 05/12/2018   COPD with  acute exacerbation (HCC) 01/21/2018   Multivessel CAD - CTO dLAD, mCx. DES PCI RI, PTCA of RPDA 01/21/2018   Stable angina 01/20/2018   Depression 01/20/2018   Essential hypertension 01/20/2018   Sleep apnea 10/06/2016   Tobacco use disorder 12/19/2015   Type 2 diabetes mellitus with circulatory disorder, without long-term current use of insulin (HCC) 12/19/2015   Hyperlipidemia with target LDL less than 70 12/19/2015   Bladder cancer (HCC) 12/19/2015   Esophageal reflux 12/19/2015   Essential tremor 12/19/2015   History of stroke 12/19/2015    PCP: Stevphen Rochester, MD  REFERRING PROVIDER: Malen Gauze, MD   REFERRING DIAG: Left renal mass; Weakness   THERAPY DIAG:  Unsteadiness on feet  Muscle weakness (generalized)  Rationale for Evaluation and Treatment: Rehabilitation  ONSET DATE: 4 years ago   SUBJECTIVE:   SUBJECTIVE STATEMENT:  Pt reports 2/10 low back and BLE pain today.    PERTINENT HISTORY: Diabetes type 2, angina, hypertension, COPD, essential tremor, history of cancer, history of CVA, history of MI, depression, and arthritis PAIN:  Are you having pain? Yes: NPRS scale: 2/10 Pain location: low back and both legs  Pain description: aching Aggravating factors: prolonged standing and sitting (5 minutes)  Relieving factors: medication  PRECAUTIONS: Fall  RED FLAGS: None   WEIGHT BEARING RESTRICTIONS: No  FALLS:  Has patient fallen in last 6 months? Yes. Number of falls 3-4; one was in December 2023 when he fell and hit his head when he tripped in his bedroom   LIVING ENVIRONMENT: Lives with: lives alone Lives in: House/apartment Stairs: No Has following equipment at home: Ramped entry  OCCUPATION: retired  PLOF: Independent with basic ADLs  PATIENT GOALS: improved strength, balance, and be able to pick up sticks in his yard  NEXT MD VISIT: 03/02/23  OBJECTIVE:   COGNITION: Overall cognitive status: Within functional limits for  tasks assessed     SENSATION: Light touch: Impaired with diminished sensation in both lower extremities with no dermatomal pattern Patient reports numbness in both lower extremities  EDEMA:  No edema observed  POSTURE: rounded shoulders, forward head, and flexed trunk   LOWER EXTREMITY ROM: WFL for activities assessed  LOWER EXTREMITY MMT:  MMT Right eval Left eval  Hip flexion 4-/5 4-/5  Hip extension    Hip abduction    Hip adduction    Hip internal rotation    Hip external rotation    Knee flexion 4/5 4/5  Knee extension 3+/5 4+/5  Ankle dorsiflexion 3/5 3/5  Ankle plantarflexion    Ankle inversion    Ankle eversion     (Blank rows = not tested)  FUNCTIONAL TESTS:  5 times sit to stand: 1:08.69 seconds with significant upper extremity support from the armrests Timed up and go (TUG): 22.54 seconds 30 seconds sit to stand test: 2 repetitions   GAIT: Assistive device utilized: None Level of assistance: Complete Independence Comments: Flexed trunk with decreased stride length and poor foot clearance bilaterally   TODAY'S TREATMENT:                                                                                                                              DATE:    02/11/23                                     EXERCISE LOG  Exercise Repetitions and Resistance Comments  Nustep Level 4 x 20 minutes.   Knee ext 10# x 4 minutes   Ham curls 40# x 4 minutes   Leg Press 2 plates; seat 9; 4 mins   Tandem Gait Airex x 8 reps   Side-stepping Airex x 8 reps   Rockerboard  4 minutes   NBOS                       PATIENT EDUCATION:  Education details: Plan of care, healing, prognosis, and goals for therapy Person educated: Patient Education method: Explanation Education comprehension: verbalized understanding  HOME EXERCISE PROGRAM:   ASSESSMENT:  CLINICAL IMPRESSION:  Pt arrives for today's treatment session reporting 2/10 low back and BLE pain.  Pt able to  tolerate increased standing balance activities with Airex pad today.  Pt requiring frequent BUE usage for safety and stability.  Pt reports feeling stronger since beginning therapy and interested in possibility of joining a local gym.  Pt denied any change in pain at completion of today's treatment session.   OBJECTIVE IMPAIRMENTS: Abnormal gait, decreased activity tolerance, decreased balance, decreased mobility, difficulty walking, decreased strength, impaired sensation, postural dysfunction, and pain.   ACTIVITY LIMITATIONS: carrying, lifting, standing, stairs, transfers, and locomotion level  PARTICIPATION LIMITATIONS: cleaning, laundry, shopping, community activity, and yard work  PERSONAL FACTORS: Age, Past/current experiences, Time since onset of injury/illness/exacerbation, and 3+ comorbidities: Diabetes type 2, angina, hypertension, COPD, essential tremor, history of cancer, history of CVA, history of MI, depression, and arthritis  are also affecting patient's functional outcome.   REHAB POTENTIAL: Good  CLINICAL DECISION MAKING: Evolving/moderate complexity  EVALUATION COMPLEXITY: Moderate   GOALS: Goals reviewed with patient? Yes  SHORT TERM GOALS: Target date: 01/26/23 Patient will be independent with his initial HEP. Baseline: Goal status: MET  2.  Patient will improve his 5 times sit to stand time to 45 seconds or less for improved lower extremity power.  Baseline: 34.01 seconds Goal status: MET  3.  Patient will improve his timed up and go time to 16 seconds or less for improved functional mobility. Baseline: 15.6 seconds Goal status: MET  LONG TERM GOALS: Target date: 02/16/23  Patient will be independent with his advanced HEP. Baseline:  Goal status: IN PROGRESS  2.  Patient will be able to safely pick up items from the floor for improved household independence. Baseline:  Goal status: IN PROGRESS  3.  Patient will improve his 5 times sit to stand time to 35  seconds or less for improved lower extremity power. Baseline: 34.01 seconds Goal status: MET  4.  Patient will improve his timed up and go time to 12 seconds or less to reduce his fall risk. Baseline: 9/3: 15.6 seconds Goal status: IN PROGRESS  5.  Patient will be able to safely transfer from sitting to standing with minimal to no upper extremity support. Baseline:  Goal status: PARTIALLY MET  PLAN:  PT FREQUENCY: 2x/week  PT DURATION: 6 weeks  PLANNED INTERVENTIONS: Therapeutic exercises, Therapeutic activity, Neuromuscular re-education, Balance training, Gait training, Patient/Family education, Self Care, Stair training, and Re-evaluation  PLAN FOR NEXT SESSION: NuStep, lower extremity strengthening, and balance interventions  Newman Pies, PTA 02/11/2023, 12:43 PM

## 2023-02-11 NOTE — Telephone Encounter (Signed)
Spoke with Alleen Borne and explained that I was trying to get a hold of Magda Paganini or [place].  Brett Canales will contact Magda Paganini and let her know I am trying to call and ask her to call me.

## 2023-02-14 ENCOUNTER — Telehealth: Payer: Self-pay | Admitting: Genetic Counselor

## 2023-02-14 ENCOUNTER — Ambulatory Visit: Payer: Self-pay | Admitting: Genetic Counselor

## 2023-02-14 DIAGNOSIS — Z1379 Encounter for other screening for genetic and chromosomal anomalies: Secondary | ICD-10-CM

## 2023-02-14 NOTE — Telephone Encounter (Signed)
Revealed negative genetic testing.  Discussed that we do not know why he has bladder and kidney cancer. It could be due to a different gene that we are not testing, or maybe our current technology may not be able to pick something up.  It will be important for him to keep in contact with genetics to keep up with whether additional testing may be needed.

## 2023-02-14 NOTE — Progress Notes (Signed)
HPI:  [name] was previously seen in the [facility] due to a personal and family history of cancer and personal history of polyposis and concerns regarding a hereditary predisposition to cancer. Please refer to our prior cancer genetics clinic note for more information regarding our discussion, assessment and recommendations, at the time. [name] recent genetic test results were disclosed to him, as were recommendations warranted by these results. These results and recommendations are discussed in more detail below.  CANCER HISTORY:  Oncology History  Bladder cancer (HCC)  12/19/2015 Initial Diagnosis   Bladder cancer (HCC)   02/08/2023 Genetic Testing   Negative genetic testing on the CancerNext-Expanded+RNAinsight panel.  The report date is February 08, 2023.  The CancerNext-Expanded gene panel offered by [facility] and includes sequencing and rearrangement analysis for the following 77 genes: AIP, ALK, APC*, ATM*, AXIN2, BAP1, BARD1, BMPR1A, BRCA1*, BRCA2*, BRIP1*, CDC73, CDH1*, CDK4, CDKN1B, CDKN2A, CHEK2*, CTNNA1, DICER1, FH, FLCN, KIF1B, LZTR1, MAX, MEN1, MET, MLH1*, MSH2*, MSH3, MSH6*, MUTYH*, NF1*, NF2, NTHL1, PALB2*, PHOX2B, PMS2*, POT1, PRKAR1A, PTCH1, PTEN*, RAD51C*, RAD51D*, RB1, RET, SDHA, SDHAF2, SDHB, SDHC, SDHD, SMAD4, SMARCA4, SMARCB1, SMARCE1, STK11, SUFU, TMEM127, TP53*, TSC1, TSC2, and VHL (sequencing and deletion/duplication); EGFR, EGLN1, HOXB13, KIT, MITF, PDGFRA, POLD1, and POLE (sequencing only); EPCAM and GREM1 (deletion/duplication only). DNA and RNA analyses performed for * genes.    Ureteral cancer, left (HCC)  11/08/2022 Initial Diagnosis   Ureteral cancer, left (HCC)     FAMILY HISTORY:  We obtained a detailed, 4-generation family history.  Significant diagnoses are listed below: Family History  Problem Relation Age of Onset   Diabetes Mother 90       type 1   Stroke Mother    Dementia Father    Diabetes Brother    Heart  attack Brother    Diabetes Brother    Testicular cancer Brother        dx. 50s   Breast cancer Maternal Aunt    Lung cancer Maternal Uncle    Bladder Cancer Paternal Grandmother    Prostate cancer Paternal Grandfather    Colon cancer Neg Hx    Colon polyps Neg Hx    Esophageal cancer Neg Hx    Rectal cancer Neg Hx    Stomach cancer Neg Hx    Pancreatic cancer Neg Hx        The patient has one daughter who is cancer free.  He had three brothers.  His oldest brother had testicular cancer and died at 58.  Both parents are deceased.   The patient's mother died at 57.  She had 8 siblings, one sister had possibly breast cancer.  Her parents died of non-cancer related issues.   The patient's father died with dementia.  He had two siblings who were cancer free.  The paternal grandmother had bladder cancer and the grandfather had prostate cancer.   [name] is unaware of previous family history of genetic testing for hereditary cancer risks. There is no reported Ashkenazi Jewish ancestry. There is no known consanguinity  GENETIC TEST RESULTS: Genetic testing reported out on February 08, 2023 through the CancerNext-Expanded+RNAinsight cancer panel found no pathogenic mutations. The CancerNext-Expanded gene panel offered by [facility] and includes sequencing and rearrangement analysis for the following 77 genes: AIP, ALK, APC*, ATM*, AXIN2, BAP1, BARD1, BMPR1A, BRCA1*, BRCA2*, BRIP1*, CDC73, CDH1*, CDK4, CDKN1B, CDKN2A, CHEK2*, CTNNA1, DICER1, FH, FLCN, KIF1B, LZTR1, MAX, MEN1, MET, MLH1*, MSH2*, MSH3, MSH6*, MUTYH*, NF1*, NF2, NTHL1, PALB2*,  PHOX2B, PMS2*, POT1, PRKAR1A, PTCH1, PTEN*, RAD51C*, RAD51D*, RB1, RET, SDHA, SDHAF2, SDHB, SDHC, SDHD, SMAD4, SMARCA4, SMARCB1, SMARCE1, STK11, SUFU, TMEM127, TP53*, TSC1, TSC2, and VHL (sequencing and deletion/duplication); EGFR, EGLN1, HOXB13, KIT, MITF, PDGFRA, POLD1, and POLE (sequencing only); EPCAM and GREM1 (deletion/duplication only). DNA and  RNA analyses performed for * genes. The test report has been scanned into EPIC and is located under the Molecular Pathology section of the Results Review tab.  A portion of the result report is included below for reference.     We discussed with [name] that because current genetic testing is not perfect, it is possible there may be a gene mutation in one of these genes that current testing cannot detect, but that chance is small.  We also discussed, that there could be another gene that has not yet been discovered, or that we have not yet tested, that is responsible for the cancer diagnoses in the family. It is also possible there is a hereditary cause for the cancer in the family that [name] did not inherit and therefore was not identified in his testing.  Therefore, it is important to remain in touch with cancer genetics in the future so that we can continue to offer [name] the most up to date genetic testing.   ADDITIONAL GENETIC TESTING: We discussed with [name] that his genetic testing was fairly extensive.  If there are genes identified to increase cancer risk that can be analyzed in the future, we would be happy to discuss and coordinate this testing at that time.    CANCER SCREENING RECOMMENDATIONS: [name] test result is considered negative (normal).  This means that we have not identified a hereditary cause for his personal and family history of cancer and personal history of polyposis at this time. Most cancers happen by chance and this negative test suggests that his personal and family history of cancer may fall into this category.    Possible reasons for [name] negative genetic test include:  1. There may be a gene mutation in one of these genes that current testing methods cannot detect but that chance is small.  2. There could be another gene that has not yet been discovered, or that we have not yet tested, that is responsible for the cancer  diagnoses in the family.  3.  There may be no hereditary risk for cancer in the family. The cancers in [name] and/or his family may be sporadic/familial or due to other genetic and environmental factors. 4. It is also possible there is a hereditary cause for the cancer in the family that [name] did not inherit.  Therefore, it is recommended he continue to follow the cancer management and screening guidelines provided by his oncology and primary healthcare provider. An individual's cancer risk and medical management are not determined by genetic test results alone. Overall cancer risk assessment incorporates additional factors, including personal medical history, family history, and any available genetic information that may result in a personalized plan for cancer prevention and surveillance   RECOMMENDATIONS FOR FAMILY MEMBERS:  Individuals in this family might be at some increased risk of developing cancer, over the general population risk, simply due to the family history of cancer.  We recommended women in this family have a yearly mammogram beginning at age 20, or 47 years younger than the earliest onset of cancer, an annual clinical breast exam, and perform monthly breast self-exams. Women in this family should also have a gynecological  exam as recommended by their primary provider. All family members should be referred for colonoscopy starting at age 68.  FOLLOW-UP: Lastly, we discussed with [name] that cancer genetics is a rapidly advancing field and it is possible that new genetic tests will be appropriate for him and/or his family members in the future. We encouraged him to remain in contact with cancer genetics on an annual basis so we can update his personal and family histories and let him know of advances in cancer genetics that may benefit this family.   Our contact number was provided. [name] questions were answered to his satisfaction, and he knows he is welcome  to call us at anytime with additional questions or concerns.   Maylon Cos, MS, [facility] Licensed, Certified Genetic Counselor [email or web address]

## 2023-02-15 ENCOUNTER — Ambulatory Visit: Payer: Medicare Other

## 2023-02-16 ENCOUNTER — Ambulatory Visit: Payer: Medicare Other | Admitting: Gastroenterology

## 2023-02-17 ENCOUNTER — Ambulatory Visit: Payer: Medicare Other

## 2023-02-17 DIAGNOSIS — M6281 Muscle weakness (generalized): Secondary | ICD-10-CM | POA: Diagnosis not present

## 2023-02-17 DIAGNOSIS — R2681 Unsteadiness on feet: Secondary | ICD-10-CM

## 2023-02-17 NOTE — Therapy (Signed)
OUTPATIENT PHYSICAL THERAPY LOWER EXTREMITY TREATMENT   Patient Name: Andrew [name] MRN: [id number] DOB:[birth year], 72 y.o., male Today's Date: 02/17/2023  END OF SESSION:  PT End of Session - 02/17/23 1031     Visit Number 12    Number of Visits 12    Date for PT Re-Evaluation 03/18/23    PT Start Time 1015    Activity Tolerance Patient tolerated treatment well    Behavior During Therapy [facility] for tasks assessed/performed              Past Medical History:  Diagnosis Date   Acute viral pericarditis 09/03/2018   Inferior STE - but Negative Troponin.  CP &SOB.  Felt to be Viral.   Anemia    Arthritis    Bladder cancer (HCC) 2014   CAD (coronary artery disease)    Cataract    bilateral   Complication of anesthesia    Depression    Diabetes mellitus without complication (HCC)    Essential hypertension 01/20/2018   in the past no longer on medication   Family history of breast cancer    Family history of prostate cancer    Family history of testicular cancer    GERD (gastroesophageal reflux disease)    Heart attack (HCC) 03/2022   Hx of adenomatous colonic polyps 09/2017   colonoscopy   Hyperlipidemia with target LDL less than 70 12/19/2015   Intraoperative floppy iris syndrome (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 DES PCI RI, PTCA of RPDA 01/21/2018   12/2017 - Cath for ? STEMI -> distal/apical LAD & m-dCx CTO (unable to cross Cx).  Mod rPDA. Severe RI - DES PCI.  08/2018 - Cath for ? Inf STEMI - RI stent patent & CTO dLAD/mCx. Progression of rPDA 95% -> PTCA only.  Thought to be Pericarditis & not MI (troponin negative).    Neuromuscular disorder (HCC)    Polyposis of colon 01/25/2023   Sleep apnea    BiPAP not currently being used   Status post tendon repair 1989   STEMI (ST elevation myocardial infarction) (HCC) 01/20/2018   Cardiac cath January 21, 2018: Severe multivessel disease with unclear lesion, but opted for PCI/DES  x1 to the RI.  Appeared to have CTO of the distal LAD and circumflex as well as moderate mid LAD and diagonal disease as well as PDA.Marland Kitchen  Unable to cross circumflex lesion.   STEMI (ST elevation myocardial infarction) (HCC) 03/2022   Stroke (HCC) 11/2015   At times pt has dizziness with loss of vision   Tremors of nervous system 2011   Past Surgical History:  Procedure Laterality Date   APPENDECTOMY     BLADDER SURGERY     COLONOSCOPY     CORONARY STENT INTERVENTION N/A 01/21/2018   Procedure: CORONARY STENT INTERVENTION;  Surgeon: Marykay Lex, MD;  Location: [facility] INVASIVE CV LAB;  Service: Cardiovascular;  Laterality: N/A;  95% Ramus Intermedius -PCI with synergy DES 2.25 mm x 12 mm postdilated 2.4 mm.   CORONARY/GRAFT ACUTE MI REVASCULARIZATION N/A 01/21/2018   Procedure: Coronary/Graft Acute MI Revascularization;  Surgeon: Marykay Lex, MD;  Location: [facility] INVASIVE CV LAB;  Service: Cardiovascular;  Laterality: N/A;  attempted revas to distal CFX;    CORONARY/GRAFT ACUTE MI REVASCULARIZATION N/A 09/04/2018   Procedure: Coronary/Graft Acute MI Revascularization;  Surgeon: Tonny Bollman, MD;  Location: [facility] INVASIVE CV LAB:: PTCA/POBA of rPDA (2.0 mm balloon)   CYSTOSCOPY N/A  01/21/2020   Procedure: CYSTOSCOPY;  Surgeon: Malen Gauze, MD;  Location: [facility];  Service: Urology;  Laterality: N/A;   CYSTOSCOPY N/A 02/09/2021   Procedure: CYSTOSCOPY;  Surgeon: Malen Gauze, MD;  Location: [facility];  Service: Urology;  Laterality: N/A;   CYSTOSCOPY Left 11/08/2022   Procedure: CYSTOSCOPY;  Surgeon: Malen Gauze, MD;  Location: [facility];  Service: Urology;  Laterality: Left;   CYSTOSCOPY W/ RETROGRADES Bilateral 08/22/2015   Procedure: CYSTOSCOPY WITH RETROGRADE PYELOGRAM;  Surgeon: Bjorn Pippin, MD;  Location: [facility];  Service: Urology;  Laterality: Bilateral;   CYSTOSCOPY W/ RETROGRADES Bilateral 01/16/2016   Procedure: CYSTOSCOPY WITH RETROGRADE PYELOGRAM;  Surgeon: Bjorn Pippin, MD;   Location: [facility];  Service: Urology;  Laterality: Bilateral;   CYSTOSCOPY W/ RETROGRADES Bilateral 01/07/2017   Procedure: CYSTOSCOPY WITH RETROGRADE PYELOGRAM;  Surgeon: Bjorn Pippin, MD;  Location: [facility];  Service: Urology;  Laterality: Bilateral;   CYSTOSCOPY WITH BIOPSY N/A 08/22/2015   Procedure: CYSTOSCOPY WITH BLADDER BIOPSY;  Surgeon: Bjorn Pippin, MD;  Location: [facility];  Service: Urology;  Laterality: N/A;   CYSTOSCOPY WITH BIOPSY N/A 01/16/2016   Procedure: CYSTOSCOPY WITH BLADDER BIOPSY;  Surgeon: Bjorn Pippin, MD;  Location: [facility];  Service: Urology;  Laterality: N/A;   CYSTOSCOPY WITH FULGERATION N/A 01/16/2016   Procedure: CYSTOSCOPY WITH FULGERATION;  Surgeon: Bjorn Pippin, MD;  Location: [facility];  Service: Urology;  Laterality: N/A;   CYSTOSCOPY/RETROGRADE/URETEROSCOPY Left 09/06/2022   Procedure: CYSTOSCOPY/RETROGRADE/URETEROSCOPY/STENT PLACEMENT;  Surgeon: Malen Gauze, MD;  Location: [facility];  Service: Urology;  Laterality: Left;   KNEE ARTHROSCOPY Right 1989   LEFT HEART CATH AND CORONARY ANGIOGRAPHY N/A 01/21/2018   Procedure: LEFT HEART CATH AND CORONARY ANGIOGRAPHY;  Surgeon: Marykay Lex, MD;  Location: [facility] INVASIVE CV LAB;  Service: Cardiovascular;; 95% RI-DES PCI.  80% o-pCX (PTCA) -> dCX 80%-100% CTO (unsuccessful PTCA unable to cross distal lesion with balloon.)  100% CTO dLAD. RPDA 80% and 70% as well as P AV 180%, PA V2 60%.  (too small for PCI)   LEFT HEART CATH AND CORONARY ANGIOGRAPHY N/A 09/04/2018   Procedure: LEFT HEART CATH AND CORONARY ANGIOGRAPHY;  Surgeon: Tonny Bollman, MD;  Location: [facility] INVASIVE CV LAB: (presumed Inferior STEMI) = progression of small rPDA -95% (PTCA only). Patent RI stent. CTO of apical LAD & mCx.   ROBOT ASSITED LAPAROSCOPIC NEPHROURETERECTOMY Left 11/08/2022   Procedure: XI ROBOT ASSITED LAPAROSCOPIC NEPHROURETERECTOMY;  Surgeon: Malen Gauze, MD;  Location: [facility];  Service: Urology;  Laterality: Left;   ROTATOR CUFF REPAIR  Bilateral 2000   rt. leg fracture surgery     TRANSTHORACIC ECHOCARDIOGRAM  01/21/2018   Mild LVH.  EF 50 and 55%.  Apical inferior hypokinesis.  Mid-apical anterolateral hypokinesis.  Normal diastolic function for age    TRANSTHORACIC ECHOCARDIOGRAM  09/04/2018   -? Inf STEMI vs. Pericarditis:  Mildly reduced EF of 45 to 50%.  Impaired relaxation-GR 1 DD.  Severe hypokinesis of the anterolateral and inferolateral wall.  Small-moderate anterior pericardial effusion.  Moderate aortic sclerosis but no stenosis.   TRANSURETHRAL RESECTION OF BLADDER TUMOR N/A 01/07/2017   Procedure: TRANSURETHRAL RESECTION OF BLADDER TUMOR (TURBT);  Surgeon: Bjorn Pippin, MD;  Location: [facility];  Service: Urology;  Laterality: N/A;   TRANSURETHRAL RESECTION OF BLADDER TUMOR N/A 01/21/2020   Procedure: TRANSURETHRAL RESECTION OF BLADDER TUMOR (TURBT);  Surgeon: Malen Gauze, MD;  Location: [facility];  Service: Urology;  Laterality: N/A;   TRANSURETHRAL RESECTION OF  BLADDER TUMOR N/A 02/09/2021   Procedure: TRANSURETHRAL RESECTION OF BLADDER TUMOR (TURBT);  Surgeon: Malen Gauze, MD;  Location: [facility];  Service: Urology;  Laterality: N/A;   URETERAL BIOPSY Left 09/06/2022   Procedure: URETERAL BIOPSY/fulgeration;  Surgeon: Malen Gauze, MD;  Location: [facility];  Service: Urology;  Laterality: Left;   WISDOM TOOTH EXTRACTION     Patient Active Problem List   Diagnosis Date Noted   Genetic testing 02/10/2023   Polyposis of colon 01/25/2023   Family history of prostate cancer    Family history of testicular cancer    Family history of breast cancer    Ureteral cancer, 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 DES PCI RI, PTCA of RPDA 01/21/2018   Stable angina 01/20/2018   Depression 01/20/2018   Essential hypertension 01/20/2018   Sleep apnea 10/06/2016   Tobacco use disorder 12/19/2015   Type 2 diabetes mellitus with circulatory disorder, without long-term current use of insulin (HCC) 12/19/2015   Hyperlipidemia with target LDL less than 70 12/19/2015   Bladder cancer (HCC) 12/19/2015   Esophageal reflux 12/19/2015   Essential tremor 12/19/2015   History of stroke 12/19/2015    PCP: Stevphen Rochester, MD  REFERRING PROVIDER: Malen Gauze, MD   REFERRING DIAG: Left renal mass; Weakness   THERAPY DIAG:  Unsteadiness on feet  Muscle weakness (generalized)  Rationale for Evaluation and Treatment: Rehabilitation  ONSET DATE: 4 years ago   SUBJECTIVE:   SUBJECTIVE STATEMENT:  Pt reports 2/10 low back and BLE pain today.  Pt ready for discharge today.   PERTINENT HISTORY: Diabetes type 2, angina, hypertension, COPD, essential tremor, history of cancer, history of CVA, history of MI, depression, and arthritis PAIN:  Are you having pain? Yes: NPRS scale: 2/10 Pain location: low back and both legs  Pain description: aching Aggravating factors: prolonged standing and sitting (5 minutes)  Relieving factors: medication  PRECAUTIONS: Fall  RED FLAGS: None   WEIGHT BEARING RESTRICTIONS: No  FALLS:  Has patient fallen in last 6 months? Yes. Number of falls 3-4; one was in December 2023 when he fell and hit his head when he tripped in his bedroom   LIVING ENVIRONMENT: Lives with: lives alone Lives in: House/apartment Stairs: No Has following equipment at home: Ramped entry  OCCUPATION: retired  PLOF: Independent with basic ADLs  PATIENT GOALS: improved strength, balance, and be able to pick up sticks in his yard  NEXT MD VISIT: 03/02/23  OBJECTIVE:   COGNITION: Overall cognitive status: Within functional limits for tasks  assessed     SENSATION: Light touch: Impaired with diminished sensation in both lower extremities with no dermatomal pattern Patient reports numbness in both lower extremities  EDEMA:  No edema observed  POSTURE: rounded shoulders, forward head, and flexed trunk   LOWER EXTREMITY ROM: WFL for activities assessed  LOWER EXTREMITY MMT:  MMT Right eval Left eval  Hip flexion 4-/5 4-/5  Hip extension    Hip abduction    Hip adduction    Hip internal rotation    Hip external rotation    Knee flexion 4/5 4/5  Knee extension 3+/5 4+/5  Ankle dorsiflexion 3/5  3/5  Ankle plantarflexion    Ankle inversion    Ankle eversion     (Blank rows = not tested)  FUNCTIONAL TESTS:  5 times sit to stand: 1:08.69 seconds with significant upper extremity support from the armrests Timed up and go (TUG): 22.54 seconds 30 seconds sit to stand test: 2 repetitions   GAIT: Assistive device utilized: None Level of assistance: Complete Independence Comments: Flexed trunk with decreased stride length and poor foot clearance bilaterally   TODAY'S TREATMENT:                                                                                                                              DATE:    02/11/23                                     EXERCISE LOG  Exercise Repetitions and Resistance Comments  Nustep Level 4 x 20 minutes.   Knee ext 10# x 4.5 minutes   Ham curls 40# x 4.5 minutes   Leg Press 2 plates; seat 9; 4.5 mins   Tandem Gait    Side-stepping    Rockerboard  4 minutes   NBOS                       PATIENT EDUCATION:  Education details: Plan of care, healing, prognosis, and goals for therapy Person educated: Patient Education method: Explanation Education comprehension: verbalized understanding  HOME EXERCISE PROGRAM:   ASSESSMENT:  CLINICAL IMPRESSION:  Pt arrives for today's treatment session 2/10 low back and BLE pain.  Pt able to perform TUG in 13.4 seconds making good  progress towards his goal, but not meeting it at this time.  Pt able to pick up two objects from the floor safely without balance issues.  Pt unable to perform STS from lower chair without use of BUE support, but is able to perform from elevated mat table.  Pt able to tolerate increased time with cybex exercises today without minimal fatigue.  Pt given information about local gym program.  Pt encouraged to call facility with any questions or concerns.  Pt reported no change in pain level at completion of today's treatment session.  Pt ready to discharge at this time.   OBJECTIVE IMPAIRMENTS: Abnormal gait, decreased activity tolerance, decreased balance, decreased mobility, difficulty walking, decreased strength, impaired sensation, postural dysfunction, and pain.   ACTIVITY LIMITATIONS: carrying, lifting, standing, stairs, transfers, and locomotion level  PARTICIPATION LIMITATIONS: cleaning, laundry, shopping, community activity, and yard work  PERSONAL FACTORS: Age, Past/current experiences, Time since onset of injury/illness/exacerbation, and 3+ comorbidities: Diabetes type 2, angina, hypertension, COPD, essential tremor, history of cancer, history of CVA, history of MI, depression, and arthritis  are also affecting patient's functional outcome.   REHAB POTENTIAL: Good  CLINICAL DECISION MAKING: Evolving/moderate complexity  EVALUATION COMPLEXITY: Moderate   GOALS:  Goals reviewed with patient? Yes  SHORT TERM GOALS: Target date: 01/26/23 Patient will be independent with his initial HEP. Baseline: Goal status: MET  2.  Patient will improve his 5 times sit to stand time to 45 seconds or less for improved lower extremity power. Baseline: 34.01 seconds Goal status: MET  3.  Patient will improve his timed up and go time to 16 seconds or less for improved functional mobility. Baseline: 15.6 seconds Goal status: MET  LONG TERM GOALS: Target date: 02/16/23  Patient will be independent with  his advanced HEP. Baseline:  Goal status: MET  2.  Patient will be able to safely pick up items from the floor for improved household independence. Baseline:  Goal status: MET  3.  Patient will improve his 5 times sit to stand time to 35 seconds or less for improved lower extremity power. Baseline: 34.01 seconds Goal status: MET  4.  Patient will improve his timed up and go time to 12 seconds or less to reduce his fall risk. Baseline: 9/3: 15.6 seconds; 9/26: 13.4 seconds Goal status: IN PROGRESS  5.  Patient will be able to safely transfer from sitting to standing with minimal to no upper extremity support. Baseline:  Goal status: PARTIALLY MET  PLAN:  PT FREQUENCY: 2x/week  PT DURATION: 6 weeks  PLANNED INTERVENTIONS: Therapeutic exercises, Therapeutic activity, Neuromuscular re-education, Balance training, Gait training, Patient/Family education, Self Care, Stair training, and Re-evaluation  PLAN FOR NEXT SESSION: NuStep, lower extremity strengthening, and balance interventions  Newman Pies, PTA 02/17/2023, 11:15 AM

## 2023-02-21 ENCOUNTER — Encounter: Payer: Self-pay | Admitting: Gastroenterology

## 2023-02-21 ENCOUNTER — Ambulatory Visit: Payer: Medicare Other | Admitting: Gastroenterology

## 2023-02-21 VITALS — BP 120/68 | HR 68 | Temp 97.7°F | Resp 13 | Ht 76.0 in | Wt 180.0 lb

## 2023-02-21 DIAGNOSIS — D123 Benign neoplasm of transverse colon: Secondary | ICD-10-CM | POA: Diagnosis not present

## 2023-02-21 DIAGNOSIS — D125 Benign neoplasm of sigmoid colon: Secondary | ICD-10-CM

## 2023-02-21 DIAGNOSIS — Z860100 Personal history of colon polyps, unspecified: Secondary | ICD-10-CM

## 2023-02-21 DIAGNOSIS — K633 Ulcer of intestine: Secondary | ICD-10-CM | POA: Diagnosis not present

## 2023-02-21 DIAGNOSIS — K2289 Other specified disease of esophagus: Secondary | ICD-10-CM

## 2023-02-21 DIAGNOSIS — K219 Gastro-esophageal reflux disease without esophagitis: Secondary | ICD-10-CM

## 2023-02-21 DIAGNOSIS — Z09 Encounter for follow-up examination after completed treatment for conditions other than malignant neoplasm: Secondary | ICD-10-CM

## 2023-02-21 DIAGNOSIS — K6389 Other specified diseases of intestine: Secondary | ICD-10-CM

## 2023-02-21 DIAGNOSIS — K2270 Barrett's esophagus without dysplasia: Secondary | ICD-10-CM

## 2023-02-21 MED ORDER — SODIUM CHLORIDE 0.9 % IV SOLN: 500.0000 mL | Freq: Once | INTRAVENOUS | Status: DC

## 2023-02-21 NOTE — Op Note (Signed)
[facility] Patient Name: Andrew [name] Procedure Date: 02/21/2023 2:06 PM MRN: [id number] Endoscopist: Viviann Spare P. Adela Lank , MD, [id number] Age: 72 Referring MD:  Date of Birth: 1950-09-24 Gender: Male Account #: [id number] Procedure:                Colonoscopy Indications:              High risk colon cancer surveillance: Personal                            history of colonic polyps - 13 polyps removed 05/2021 Medicines:                Monitored Anesthesia Care Procedure:                Pre-Anesthesia Assessment:                           - Prior to the procedure, a History and Physical                            was performed, and patient medications and                            allergies were reviewed. The patient's tolerance of                            previous anesthesia was also reviewed. The risks                            and benefits of the procedure and the sedation                            options and risks were discussed with the patient.                            All questions were answered, and informed consent                            was obtained. Prior Anticoagulants: The patient has                            taken Plavix (clopidogrel), last dose was 5 days                            prior to procedure. ASA Grade Assessment: III - A                            patient with severe systemic disease. After                            reviewing the risks and benefits, the patient was                            deemed in satisfactory condition to undergo the  procedure.                           After obtaining informed consent, the colonoscope                            was passed under direct vision. Throughout the                            procedure, the patient's blood pressure, pulse, and                            oxygen saturations were monitored continuously. The                            Olympus Scope SN: [id number] was  introduced through                            the anus and advanced to the the cecum, identified                            by appendiceal orifice and ileocecal valve. The                            colonoscopy was performed without difficulty. The                            patient tolerated the procedure well. The quality                            of the bowel preparation was adequate. The                            ileocecal valve, appendiceal orifice, and rectum                            were photographed. Scope In: 2:32:52 PM Scope Out: 3:02:13 PM Scope Withdrawal Time: 0 hours 22 minutes 27 seconds  Total Procedure Duration: 0 hours 29 minutes 21 seconds  Findings:                 The perianal and digital rectal examinations were                            normal.                           A 5 to 6 mm polyp was found in the hepatic flexure.                            The polyp was sessile. The polyp was removed with a                            cold snare. Resection and retrieval were complete.  Two two-three mm ulcers were found in the ascending                            colon. No bleeding was present. Biopsies were taken                            with a cold forceps for histology.                           Three sessile polyps were found in the transverse                            colon. The polyps were 3 to 4 mm in size. These                            polyps were removed with a cold snare. Resection                            and retrieval were complete.                           A 4 mm polyp was found in the sigmoid colon. The                            polyp was sessile. The polyp was removed with a                            cold snare. Resection and retrieval were complete.                           A few small-mouthed diverticula were found in the                            sigmoid colon.                           Internal hemorrhoids were  found during retroflexion.                           A large amount of liquid stool was found in the                            entire colon, making visualization difficult.                            Lavage of the colon was performed using copious                            amounts of sterile water, resulting in clearance                            with adequate visualization. This process prolonged  the exam.                           The exam was otherwise without abnormality. Complications:            No immediate complications. Estimated blood loss:                            Minimal. Estimated Blood Loss:     Estimated blood loss was minimal. Impression:               - One 5 to 6 mm polyp at the hepatic flexure,                            removed with a cold snare. Resected and retrieved.                           - Two small ulcers in the ascending colon. Biopsied.                           - Three 3 to 4 mm polyps in the transverse colon,                            removed with a cold snare. Resected and retrieved.                           - One 4 mm polyp in the sigmoid colon, removed with                            a cold snare. Resected and retrieved.                           - Diverticulosis in the sigmoid colon.                           - Internal hemorrhoids.                           - Stool in the entire examined colon leading to                            extensive lavage.                           - The examination was otherwise normal. Recommendation:           - Patient has a contact number available for                            emergencies. The signs and symptoms of potential                            delayed complications were discussed with the  patient. Return to normal activities tomorrow.                            Written discharge instructions were provided to the                            patient.                            - Resume previous diet.                           - Continue present medications.                           - Resume Plavix tomorrow.                           - Await pathology results. Viviann Spare P. Tiphany Fayson, MD 02/21/2023 3:09:27 PM This report has been signed electronically.

## 2023-02-21 NOTE — Progress Notes (Signed)
[facility] History and Physical   Primary Care Physician:  Stevphen Rochester, MD   Reason for Procedure:   Barrett's esophagus, epidermoid metaplasia, history of colon polyps  Plan:    EGD and colonoscopy     HPI: Andrew [name] is a 72 y.o. male  here for EGD and colonoscopy to evaluate issues as above. See office visit 01/03/23 for details of his history.  Otherwise feels well without any cardiopulmonary symptoms. Has been off Plavix for 5 days prior to this exam.   I have discussed risks / benefits of anesthesia and endoscopic procedure with Andrew [name] and they wish to proceed with the exams as outlined today.    Past Medical History:  Diagnosis Date   Acute viral pericarditis 09/03/2018   Inferior STE - but Negative Troponin.  CP &SOB.  Felt to be Viral.   Anemia    Arthritis    Bladder cancer (HCC) 2014   CAD (coronary artery disease)    Cataract    bilateral   Complication of anesthesia    Depression    Diabetes mellitus without complication (HCC)    Essential hypertension 01/20/2018   in the past no longer on medication   Family history of breast cancer    Family history of prostate cancer    Family history of testicular cancer    GERD (gastroesophageal reflux disease)    Heart attack (HCC) 03/2022   Hx of adenomatous colonic polyps 09/2017   colonoscopy   Hyperlipidemia with target LDL less than 70 12/19/2015   Intraoperative floppy iris syndrome (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 DES PCI RI, PTCA of RPDA 01/21/2018   12/2017 - Cath for ? STEMI -> distal/apical LAD & m-dCx CTO (unable to cross Cx).  Mod rPDA. Severe RI - DES PCI.  08/2018 - Cath for ? Inf STEMI - RI stent patent & CTO dLAD/mCx. Progression of rPDA 95% -> PTCA only.  Thought to be Pericarditis & not MI (troponin negative).    Neuromuscular disorder (HCC)    Polyposis of colon 01/25/2023   Sleep apnea    BiPAP not currently being used    Status post tendon repair 1989   STEMI (ST elevation myocardial infarction) (HCC) 01/20/2018   Cardiac cath January 21, 2018: Severe multivessel disease with unclear lesion, but opted for PCI/DES x1 to the RI.  Appeared to have CTO of the distal LAD and circumflex as well as moderate mid LAD and diagonal disease as well as PDA.Marland Kitchen  Unable to cross circumflex lesion.   STEMI (ST elevation myocardial infarction) (HCC) 03/2022   Stroke (HCC) 11/2015   At times pt has dizziness with loss of vision   Tremors of nervous system 2011    Past Surgical History:  Procedure Laterality Date   APPENDECTOMY     BLADDER SURGERY     COLONOSCOPY     CORONARY STENT INTERVENTION N/A 01/21/2018   Procedure: CORONARY STENT INTERVENTION;  Surgeon: Marykay Lex, MD;  Location: [facility] INVASIVE CV LAB;  Service: Cardiovascular;  Laterality: N/A;  95% Ramus Intermedius -PCI with synergy DES 2.25 mm x 12 mm postdilated 2.4 mm.   CORONARY/GRAFT ACUTE MI REVASCULARIZATION N/A 01/21/2018   Procedure: Coronary/Graft Acute MI Revascularization;  Surgeon: Marykay Lex, MD;  Location: [facility] INVASIVE CV LAB;  Service: Cardiovascular;  Laterality: N/A;  attempted revas to distal CFX;    CORONARY/GRAFT ACUTE MI REVASCULARIZATION N/A 09/04/2018  Procedure: Coronary/Graft Acute MI Revascularization;  Surgeon: Tonny Bollman, MD;  Location: [facility] INVASIVE CV LAB:: PTCA/POBA of rPDA (2.0 mm balloon)   CYSTOSCOPY N/A 01/21/2020   Procedure: CYSTOSCOPY;  Surgeon: Malen Gauze, MD;  Location: [facility];  Service: Urology;  Laterality: N/A;   CYSTOSCOPY N/A 02/09/2021   Procedure: CYSTOSCOPY;  Surgeon: Malen Gauze, MD;  Location: [facility];  Service: Urology;  Laterality: N/A;   CYSTOSCOPY Left 11/08/2022   Procedure: CYSTOSCOPY;  Surgeon: Malen Gauze, MD;  Location: [facility];  Service: Urology;  Laterality: Left;   CYSTOSCOPY W/ RETROGRADES Bilateral 08/22/2015   Procedure: CYSTOSCOPY WITH RETROGRADE PYELOGRAM;  Surgeon: Bjorn Pippin, MD;  Location: [facility];  Service: Urology;  Laterality: Bilateral;   CYSTOSCOPY W/ RETROGRADES Bilateral 01/16/2016   Procedure: CYSTOSCOPY WITH RETROGRADE PYELOGRAM;  Surgeon: Bjorn Pippin, MD;  Location: [facility];  Service: Urology;  Laterality: Bilateral;   CYSTOSCOPY W/ RETROGRADES Bilateral 01/07/2017   Procedure: CYSTOSCOPY WITH RETROGRADE PYELOGRAM;  Surgeon: Bjorn Pippin, MD;  Location: [facility];  Service: Urology;  Laterality: Bilateral;   CYSTOSCOPY WITH BIOPSY N/A 08/22/2015   Procedure: CYSTOSCOPY WITH BLADDER BIOPSY;  Surgeon: Bjorn Pippin, MD;  Location: [facility];  Service: Urology;  Laterality: N/A;   CYSTOSCOPY WITH BIOPSY N/A 01/16/2016   Procedure: CYSTOSCOPY WITH BLADDER BIOPSY;  Surgeon: Bjorn Pippin, MD;  Location: [facility];  Service: Urology;  Laterality: N/A;   CYSTOSCOPY WITH FULGERATION N/A 01/16/2016   Procedure: CYSTOSCOPY WITH FULGERATION;  Surgeon: Bjorn Pippin, MD;  Location: [facility];  Service: Urology;  Laterality: N/A;   CYSTOSCOPY/RETROGRADE/URETEROSCOPY Left 09/06/2022   Procedure: CYSTOSCOPY/RETROGRADE/URETEROSCOPY/STENT PLACEMENT;  Surgeon: Malen Gauze, MD;  Location: [facility];  Service: Urology;  Laterality: Left;   KNEE ARTHROSCOPY Right 1989   LEFT HEART CATH AND CORONARY ANGIOGRAPHY N/A 01/21/2018   Procedure: LEFT HEART CATH AND CORONARY ANGIOGRAPHY;  Surgeon: Marykay Lex, MD;  Location: [facility] INVASIVE CV LAB;  Service: Cardiovascular;; 95% RI-DES PCI.  80% o-pCX (PTCA) -> dCX 80%-100% CTO (unsuccessful PTCA unable to cross distal lesion with balloon.)  100% CTO dLAD. RPDA 80% and 70% as well as P AV 180%, PA V2 60%.  (too small for PCI)   LEFT HEART CATH AND CORONARY ANGIOGRAPHY N/A 09/04/2018   Procedure: LEFT HEART CATH AND CORONARY ANGIOGRAPHY;  Surgeon: Tonny Bollman, MD;  Location: [facility] INVASIVE CV LAB: (presumed Inferior STEMI) = progression of small rPDA -95% (PTCA only). Patent RI stent. CTO of apical LAD & mCx.   ROBOT ASSITED LAPAROSCOPIC NEPHROURETERECTOMY  Left 11/08/2022   Procedure: XI ROBOT ASSITED LAPAROSCOPIC NEPHROURETERECTOMY;  Surgeon: Malen Gauze, MD;  Location: [facility];  Service: Urology;  Laterality: Left;   ROTATOR CUFF REPAIR Bilateral 2000   rt. leg fracture surgery     TRANSTHORACIC ECHOCARDIOGRAM  01/21/2018   Mild LVH.  EF 50 and 55%.  Apical inferior hypokinesis.  Mid-apical anterolateral hypokinesis.  Normal diastolic function for age    TRANSTHORACIC ECHOCARDIOGRAM  09/04/2018   -? Inf STEMI vs. Pericarditis:  Mildly reduced EF of 45 to 50%.  Impaired relaxation-GR 1 DD.  Severe hypokinesis of the anterolateral and inferolateral wall.  Small-moderate anterior pericardial effusion.  Moderate aortic sclerosis but no stenosis.   TRANSURETHRAL RESECTION OF BLADDER TUMOR N/A 01/07/2017   Procedure: TRANSURETHRAL RESECTION OF BLADDER TUMOR (TURBT);  Surgeon: Bjorn Pippin, MD;  Location: [facility];  Service: Urology;  Laterality: N/A;   TRANSURETHRAL RESECTION OF BLADDER TUMOR N/A 01/21/2020   Procedure: TRANSURETHRAL  RESECTION OF BLADDER TUMOR (TURBT);  Surgeon: Malen Gauze, MD;  Location: [facility];  Service: Urology;  Laterality: N/A;   TRANSURETHRAL RESECTION OF BLADDER TUMOR N/A 02/09/2021   Procedure: TRANSURETHRAL RESECTION OF BLADDER TUMOR (TURBT);  Surgeon: Malen Gauze, MD;  Location: [facility];  Service: Urology;  Laterality: N/A;   URETERAL BIOPSY Left 09/06/2022   Procedure: URETERAL BIOPSY/fulgeration;  Surgeon: Malen Gauze, MD;  Location: [facility];  Service: Urology;  Laterality: Left;   WISDOM TOOTH EXTRACTION      Prior to Admission medications   Medication Sig Start Date End Date Taking? Authorizing Provider  aspirin EC 81 MG tablet Take 81 mg by mouth daily. Swallow whole.   Yes [provider]  Cholecalciferol (VITAMIN D) 50 MCG (2000 UT) tablet Take 2,000 Units by mouth 2 (two) times daily.   Yes [provider]  empagliflozin (JARDIANCE) 25 MG TABS tablet Take 25 mg by mouth  daily. 05/02/19  Yes Dettinger, Elige Radon, MD  fenofibrate (TRICOR) 48 MG tablet Take 1 tablet (48 mg total) by mouth daily. 05/02/19  Yes Dettinger, Elige Radon, MD  furosemide (LASIX) 40 MG tablet Take 20 mg by mouth daily.   Yes [provider]  glimepiride (AMARYL) 2 MG tablet Take 2 mg by mouth daily before breakfast.   Yes [provider]  insulin degludec (TRESIBA FLEXTOUCH) 100 UNIT/ML FlexTouch Pen Inject 14 Units into the skin daily.   Yes [provider]  isosorbide mononitrate (IMDUR) 30 MG 24 hr tablet Take 1 tablet (30 mg total) by mouth daily. Patient taking differently: Take 15 mg by mouth every evening. 08/09/19  Yes Marykay Lex, MD  levothyroxine (SYNTHROID) 50 MCG tablet Take 50 mcg by mouth daily before breakfast.   Yes [provider]  metFORMIN (GLUCOPHAGE) 1000 MG tablet Take 1 tablet by mouth 2 times daily with a meal. Patient taking differently: Take 1,000 mg by mouth 2 (two) times daily with a meal. 11/19/19  Yes Dettinger, Elige Radon, MD  metoprolol succinate (TOPROL-XL) 25 MG 24 hr tablet Take 25 mg by mouth daily.   Yes [provider]  Multiple Vitamins-Minerals (PRESERVISION AREDS 2) CAPS Take 1 capsule by mouth 2 (two) times daily.   Yes [provider]  pantoprazole (PROTONIX) 40 MG tablet TAKE ONE TABLET TWICE DAILY 01/13/23  Yes Mattea Seger, Willaim Rayas, MD  primidone (MYSOLINE) 50 MG tablet Take 1 tablet (50 mg total) by mouth 4 (four) times daily. Patient taking differently: Take 100 mg by mouth 4 (four) times daily. 11/22/19  Yes Dettinger, Elige Radon, MD  ranolazine (RANEXA) 500 MG 12 hr tablet Take 500 mg by mouth 2 (two) times daily.   Yes [provider]  rosuvastatin (CRESTOR) 40 MG tablet Take 1 tablet (40 mg total) by mouth daily. 11/19/19  Yes Dettinger, Elige Radon, MD  tamsulosin (FLOMAX) 0.4 MG CAPS capsule Take 1 capsule (0.4 mg total) by mouth daily. 05/02/19  Yes Dettinger, Elige Radon, MD   Carboxymethylcellul-Glycerin (LUBRICATING EYE DROPS OP) Place 1 drop into both eyes daily as needed (dry eyes). Patient not taking: Reported on 02/21/2023    [provider]  clopidogrel (PLAVIX) 75 MG tablet Take 75 mg by mouth daily. 03/04/20   [provider]  diclofenac Sodium (VOLTAREN) 1 % GEL Apply 1 Application topically 4 (four) times daily as needed (pain). Patient not taking: Reported on 02/21/2023    [provider]  nitroGLYCERIN (NITROSTAT) 0.4 MG SL tablet PLACE  1 TABLET UNDER THE TONGUE AT ONSET OF CHEST PAIN EVERY 5 MINTUES UP TO 3 TIMES AS NEEDED Patient not taking: Reported on 02/21/2023 04/20/21   Marykay Lex, MD  [facility] VERIO test strip daily. Patient not taking: Reported on 02/21/2023 04/01/21   [provider]  oxyCODONE-acetaminophen (PERCOCET) 5-325 MG tablet Take 1 tablet by mouth every 4 (four) hours as needed for severe pain. Patient not taking: Reported on 02/21/2023 09/06/22 09/06/23  Malen Gauze, MD    Current Outpatient Medications  Medication Sig Dispense Refill   aspirin EC 81 MG tablet Take 81 mg by mouth daily. Swallow whole.     Cholecalciferol (VITAMIN D) 50 MCG (2000 UT) tablet Take 2,000 Units by mouth 2 (two) times daily.     empagliflozin (JARDIANCE) 25 MG TABS tablet Take 25 mg by mouth daily. 90 mg 3   fenofibrate (TRICOR) 48 MG tablet Take 1 tablet (48 mg total) by mouth daily. 90 tablet 3   furosemide (LASIX) 40 MG tablet Take 20 mg by mouth daily.     glimepiride (AMARYL) 2 MG tablet Take 2 mg by mouth daily before breakfast.     insulin degludec (TRESIBA FLEXTOUCH) 100 UNIT/ML FlexTouch Pen Inject 14 Units into the skin daily.     isosorbide mononitrate (IMDUR) 30 MG 24 hr tablet Take 1 tablet (30 mg total) by mouth daily. (Patient taking differently: Take 15 mg by mouth every evening.) 90 tablet 3   levothyroxine (SYNTHROID) 50 MCG tablet Take 50 mcg by mouth daily before breakfast.     metFORMIN  (GLUCOPHAGE) 1000 MG tablet Take 1 tablet by mouth 2 times daily with a meal. (Patient taking differently: Take 1,000 mg by mouth 2 (two) times daily with a meal.) 180 tablet 3   metoprolol succinate (TOPROL-XL) 25 MG 24 hr tablet Take 25 mg by mouth daily.     Multiple Vitamins-Minerals (PRESERVISION AREDS 2) CAPS Take 1 capsule by mouth 2 (two) times daily.     pantoprazole (PROTONIX) 40 MG tablet TAKE ONE TABLET TWICE DAILY 60 tablet 1   primidone (MYSOLINE) 50 MG tablet Take 1 tablet (50 mg total) by mouth 4 (four) times daily. (Patient taking differently: Take 100 mg by mouth 4 (four) times daily.) 360 tablet 3   ranolazine (RANEXA) 500 MG 12 hr tablet Take 500 mg by mouth 2 (two) times daily.     rosuvastatin (CRESTOR) 40 MG tablet Take 1 tablet (40 mg total) by mouth daily. 90 tablet 3   tamsulosin (FLOMAX) 0.4 MG CAPS capsule Take 1 capsule (0.4 mg total) by mouth daily. 90 capsule 3   Carboxymethylcellul-Glycerin (LUBRICATING EYE DROPS OP) Place 1 drop into both eyes daily as needed (dry eyes). (Patient not taking: Reported on 02/21/2023)     clopidogrel (PLAVIX) 75 MG tablet Take 75 mg by mouth daily.     diclofenac Sodium (VOLTAREN) 1 % GEL Apply 1 Application topically 4 (four) times daily as needed (pain). (Patient not taking: Reported on 02/21/2023)     nitroGLYCERIN (NITROSTAT) 0.4 MG SL tablet PLACE 1 TABLET UNDER THE TONGUE AT ONSET OF CHEST PAIN EVERY 5 MINTUES UP TO 3 TIMES AS NEEDED (Patient not taking: Reported on 02/21/2023) 25 tablet 0   ONETOUCH VERIO test strip daily. (Patient not taking: Reported on 02/21/2023)     oxyCODONE-acetaminophen (PERCOCET) 5-325 MG tablet Take 1 tablet by mouth every 4 (four) hours as needed for severe pain. (Patient not taking: Reported on 02/21/2023) 15 tablet 0  Current Facility-Administered Medications  Medication Dose Route Frequency Provider Last Rate Last Admin   0.9 %  sodium chloride infusion  500 mL Intravenous Once Rona Tomson, Willaim Rayas, MD         Allergies as of 02/21/2023 - Review Complete 02/21/2023  Allergen Reaction Noted   Tramadol Shortness Of Breath 01/14/2017   Benadryl [diphenhydramine] Other (See Comments) 11/05/2022   Betadine [povidone iodine] Itching 11/05/2022   Trulicity [dulaglutide] Swelling 02/16/2016    Family History  Problem Relation Age of Onset   Diabetes Mother 17       type 1   Stroke Mother    Dementia Father    Diabetes Brother    Heart attack Brother    Diabetes Brother    Testicular cancer Brother        dx. 50s   Breast cancer Maternal Aunt    Lung cancer Maternal Uncle    Bladder Cancer Paternal Grandmother    Prostate cancer Paternal Grandfather    Colon cancer Neg Hx    Colon polyps Neg Hx    Esophageal cancer Neg Hx    Rectal cancer Neg Hx    Stomach cancer Neg Hx    Pancreatic cancer Neg Hx     Social History   Socioeconomic History   Marital status: Single    Spouse name: Not on file   Number of children: 1   Years of education: some college   Highest education level: Some college, no degree  Occupational History   Occupation: retired    Comment: Holiday representative, some farming  Tobacco Use   Smoking status: Former    Current packs/day: 1.00    Average packs/day: 1 pack/day for 50.0 years (50.0 ttl pk-yrs)    Types: Cigarettes   Smokeless tobacco: Former  Building services engineer status: Never Used  Substance and Sexual Activity   Alcohol use: No    Comment: rarely   Drug use: No   Sexual activity: Never  Other Topics Concern   Not on file  Social History Narrative   Jahari is retired and lives alone. He has a daughter that lives locally that he sees regularly. He has a couple of horses and an outside dog. He enjoys working outside and taking care of his horses.    Social Determinants of Health   Financial Resource Strain: Low Risk  (08/23/2022)   Received from [facility]   Overall Financial Resource Strain (CARDIA)    Difficulty of Paying Living  Expenses: Not hard at all  Food Insecurity: No Food Insecurity (11/08/2022)   Hunger Vital Sign    Worried About Running Out of Food in the Last Year: Never true    Ran Out of Food in the Last Year: Never true  Transportation Needs: No Transportation Needs (11/08/2022)   PRAPARE - Administrator, Civil Service (Medical): No    Lack of Transportation (Non-Medical): No  Physical Activity: Unknown (04/20/2022)   Received from [facility], [facility]   Exercise Vital Sign    Days of Exercise per Week: 0 days    Minutes of Exercise per Session: Not on file  Stress: No Stress Concern Present (04/20/2022)   Received from [facility], [facility] - Occupational Stress Questionnaire    Feeling of Stress : Not at all  Recent Concern: Stress - Stress Concern Present (03/25/2022)   Received from [facility] of  Occupational Health - Occupational Stress Questionnaire    Feeling of Stress : To some extent  Social Connections: Somewhat Isolated (04/20/2022)   Received from [facility], [facility]   Social Network    How would you rate your social network (family, work, friends)?: Restricted participation with some degree of social isolation  Intimate Partner Violence: Not At Risk (11/08/2022)   Humiliation, Afraid, Rape, and Kick questionnaire    Fear of Current or Ex-Partner: No    Emotionally Abused: No    Physically Abused: No    Sexually Abused: No    Review of Systems: All other review of systems negative except as mentioned in the HPI.  Physical Exam: Vital signs BP 122/70   Pulse 79   Temp 97.7 F (36.5 C) (Skin)   Resp 16   Ht 6\' 4"  (1.93 m)   Wt 180 lb (81.6 kg)   SpO2 96%   BMI 21.91 kg/m   General:   Alert,  Well-developed, pleasant and cooperative in NAD Lungs:  Clear throughout to auscultation.   Heart:  Regular rate and rhythm Abdomen:  Soft, nontender and nondistended.    Neuro/Psych:  Alert and cooperative. Normal mood and affect. A and O x 3  Harlin Rain, MD [facility]

## 2023-02-21 NOTE — Patient Instructions (Addendum)
You may resume your Plavix as normal tomorrow (02/22/23), do not take today.   YOU HAD AN ENDOSCOPIC PROCEDURE TODAY AT THE  ENDOSCOPY CENTER:   Refer to the procedure report that was given to you for any specific questions about what was found during the examination.  If the procedure report does not answer your questions, please call your gastroenterologist to clarify.  If you requested that your care partner not be given the details of your procedure findings, then the procedure report has been included in a sealed envelope for you to review at your convenience later.  YOU SHOULD EXPECT: Some feelings of bloating in the abdomen. Passage of more gas than usual.  Walking can help get rid of the air that was put into your GI tract during the procedure and reduce the bloating. If you had a lower endoscopy (such as a colonoscopy or flexible sigmoidoscopy) you may notice spotting of blood in your stool or on the toilet paper. If you underwent a bowel prep for your procedure, you may not have a normal bowel movement for a few days.  Please Note:  You might notice some irritation and congestion in your nose or some drainage.  This is from the oxygen used during your procedure.  There is no need for concern and it should clear up in a day or so.  SYMPTOMS TO REPORT IMMEDIATELY:  Following lower endoscopy (colonoscopy or flexible sigmoidoscopy):  Excessive amounts of blood in the stool  Significant tenderness or worsening of abdominal pains  Swelling of the abdomen that is new, acute  Fever of 100F or higher  Following upper endoscopy (EGD)  Vomiting of blood or coffee ground material  New chest pain or pain under the shoulder blades  Painful or persistently difficult swallowing  New shortness of breath  Fever of 100F or higher  Black, tarry-looking stools  For urgent or emergent issues, a gastroenterologist can be reached at any hour by calling [phone]. Do not use MyChart messaging  for urgent concerns.    DIET:  We do recommend a small meal at first, but then you may proceed to your regular diet.  Drink plenty of fluids but you should avoid alcoholic beverages for 24 hours.  ACTIVITY:  You should plan to take it easy for the rest of today and you should NOT DRIVE or use heavy machinery until tomorrow (because of the sedation medicines used during the test).    FOLLOW UP: Our staff will call the number listed on your records the next business day following your procedure.  We will call around 7:15- 8:00 am to check on you and address any questions or concerns that you may have regarding the information given to you following your procedure. If we do not reach you, we will leave a message.     If any biopsies were taken you will be contacted by phone or by letter within the next 1-3 weeks.  Please call us at [phone] if you have not heard about the biopsies in 3 weeks.    SIGNATURES/CONFIDENTIALITY: You and/or your care partner have signed paperwork which will be entered into your electronic medical record.  These signatures attest to the fact that that the information above on your After Visit Summary has been reviewed and is understood.  Full responsibility of the confidentiality of this discharge information lies with you and/or your care-partner.

## 2023-02-21 NOTE — Progress Notes (Signed)
Vss nad trans to pacu 

## 2023-02-21 NOTE — Progress Notes (Signed)
Called to room to assist during endoscopic procedure.  Patient ID and intended procedure confirmed with present staff. Received instructions for my participation in the procedure from the performing physician.  

## 2023-02-21 NOTE — Progress Notes (Signed)
Pt's states no medical or surgical changes since previsit or office visit. VS assessed by K.P ?

## 2023-02-21 NOTE — Op Note (Signed)
[facility] Patient Name: Andrew [name] Procedure Date: 02/21/2023 2:09 PM MRN: [id number] Endoscopist: Viviann Spare P. 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 On                            protonix twice daily Medicines:                Monitored Anesthesia Care Procedure:                Pre-Anesthesia Assessment:                           - Prior to the procedure, a History and Physical                            was performed, and patient medications and                            allergies were reviewed. The patient's tolerance of                            previous anesthesia was also reviewed. The risks                            and benefits of the procedure and the sedation                            options and risks were discussed with the patient.                            All questions were answered, and informed consent                            was obtained. Prior Anticoagulants: The patient has                            taken Plavix (clopidogrel), last dose was 5 days                            prior to procedure. ASA Grade Assessment: III - A  patient with severe systemic disease. After                            reviewing the risks and benefits, the patient was                            deemed in satisfactory condition to undergo the                            procedure.                           After obtaining informed consent, the endoscope was                            passed under direct vision. Throughout the                             procedure, the patient's blood pressure, pulse, and                            oxygen saturations were monitored continuously. The                            Olympus Scope [id number] was introduced through the                            mouth, and advanced to the second part of duodenum.                            The upper GI endoscopy was accomplished without                            difficulty. The patient tolerated the procedure                            well. Scope In: Scope Out: Findings:                 Esophagogastric landmarks were identified: the                            Z-line was found at 38 cm, the gastroesophageal                            junction was found at 40 cm and the upper extent of                            the gastric folds was found at 42 cm from the                            incisors.                           A 2 cm hiatal hernia was present.  Barrett's esophagus was present in the lower third                            of the esophagus. Multiple small islands of salmon                            colored mucosa circumferentially, no nodularity.                            The maximum longitudinal extent of these mucosal                            changes was 2 cm in length. Biopsies were taken                            with a cold forceps for histology.                           A single 8-10 mm whitish colored plaque was found                            in the lower third of the esophagus, 35 cm from the                            incisors. Biopsies were taken with a cold forceps                            for histology.                           The exam of the esophagus was otherwise normal.                           The entire examined stomach was normal.                           The examined duodenum was normal. Complications:            No immediate complications. Estimated blood loss:                             Minimal. Estimated Blood Loss:     Estimated blood loss was minimal. Impression:               - Esophagogastric landmarks identified.                           - 2 cm hiatal hernia.                           - Short segment of Barrett's esophagus. Biopsied.                           - A single plaque in the lower third of the  esophagus similar in size / location as the last                            exam, likely epidermoid metaplasia. Biopsied.                           - Normal stomach.                           - Normal examined duodenum. Recommendation:           - Patient has a contact number available for                            emergencies. The signs and symptoms of potential                            delayed complications were discussed with the                            patient. Return to normal activities tomorrow.                            Written discharge instructions were provided to the                            patient.                           - Resume previous diet.                           - Continue present medications.                           - Resume Plavix tomorrow.                           - Await pathology results with further                            recommendations. Viviann Spare P. Bobie Caris, MD 02/21/2023 3:14:57 PM This report has been signed electronically.

## 2023-02-22 ENCOUNTER — Telehealth: Payer: Self-pay

## 2023-02-22 NOTE — Telephone Encounter (Signed)
Attempted f/u call. No answer, Left VM.

## 2023-02-24 ENCOUNTER — Ambulatory Visit (HOSPITAL_COMMUNITY)
Admission: RE | Admit: 2023-02-24 | Discharge: 2023-02-24 | Disposition: A | Discharge status: Home / Self Care | Payer: Medicare Other | Source: Ambulatory Visit | Attending: Urology | Admitting: Urology

## 2023-02-24 DIAGNOSIS — Z905 Acquired absence of kidney: Secondary | ICD-10-CM | POA: Diagnosis not present

## 2023-02-24 DIAGNOSIS — Z85528 Personal history of other malignant neoplasm of kidney: Secondary | ICD-10-CM | POA: Diagnosis not present

## 2023-02-24 DIAGNOSIS — Z483 Aftercare following surgery for neoplasm: Secondary | ICD-10-CM | POA: Diagnosis not present

## 2023-02-24 DIAGNOSIS — C679 Malignant neoplasm of bladder, unspecified: Secondary | ICD-10-CM | POA: Diagnosis present

## 2023-02-24 DIAGNOSIS — Z9889 Other specified postprocedural states: Secondary | ICD-10-CM | POA: Diagnosis present

## 2023-02-24 LAB — SURGICAL PATHOLOGY

## 2023-03-02 ENCOUNTER — Ambulatory Visit: Payer: Medicare Other | Admitting: Urology

## 2023-03-02 VITALS — BP 114/68 | HR 81

## 2023-03-02 DIAGNOSIS — C679 Malignant neoplasm of bladder, unspecified: Secondary | ICD-10-CM

## 2023-03-02 DIAGNOSIS — Z8551 Personal history of malignant neoplasm of bladder: Secondary | ICD-10-CM | POA: Diagnosis not present

## 2023-03-02 LAB — URINALYSIS, ROUTINE W REFLEX MICROSCOPIC
Bilirubin, UA: NEGATIVE
Ketones, UA: NEGATIVE
Leukocytes,UA: NEGATIVE
Nitrite, UA: NEGATIVE
Protein,UA: NEGATIVE
RBC, UA: NEGATIVE
Specific Gravity, UA: 1.015 (ref 1.005–1.030)
Urobilinogen, Ur: 0.2 mg/dL (ref 0.2–1.0)
pH, UA: 6 (ref 5.0–7.5)

## 2023-03-02 MED ORDER — CIPROFLOXACIN HCL 500 MG PO TABS
500.0000 mg | ORAL_TABLET | Freq: Once | ORAL | Status: AC
Start: 2023-03-02 — End: 2023-03-02
  Administered 2023-03-02: 500 mg via ORAL

## 2023-03-02 NOTE — Progress Notes (Signed)
   03/02/23  [id number] bladder cancer   HPI: Andrew [name] is a 72yo here for followup for bladder cancer Blood pressure 114/68, pulse 81. NED. A&Ox3.   No respiratory distress   Abd soft, NT, ND Normal phallus with bilateral descended testicles  Cystoscopy Procedure Note  Patient identification was confirmed, informed consent was obtained, and patient was prepped using Betadine solution.  Lidocaine jelly was administered per urethral meatus.     Pre-Procedure: - Inspection reveals a normal caliber ureteral meatus.  Procedure: The flexible cystoscope was introduced without difficulty - No urethral strictures/lesions are present. - Enlarged prostate  - Normal bladder neck - Right ureteral orifice identified - Bladder mucosa  reveals no ulcers, tumors, or lesions - No bladder stones - No trabeculation     Post-Procedure: - Patient tolerated the procedure well  Assessment/ Plan: Followup 3 months for cystoscopy  No follow-ups on file.  Andrew Aye, MD

## 2023-03-03 LAB — COMPREHENSIVE METABOLIC PANEL
ALT: 13 [IU]/L (ref 0–44)
AST: 11 [IU]/L (ref 0–40)
Albumin: 4.4 g/dL (ref 3.8–4.8)
Alkaline Phosphatase: 59 [IU]/L (ref 44–121)
BUN/Creatinine Ratio: 21 (ref 10–24)
BUN: 24 mg/dL (ref 8–27)
Bilirubin Total: <0.2 mg/dL (ref 0.0–1.2)
CO2: 22 mmol/L (ref 20–29)
Calcium: 9.4 mg/dL (ref 8.6–10.2)
Chloride: 101 mmol/L (ref 96–106)
Creatinine, Ser: 1.14 mg/dL (ref 0.76–1.27)
Globulin, Total: 2.4 g/dL (ref 1.5–4.5)
Glucose: 121 mg/dL — ABNORMAL HIGH (ref 70–99)
Potassium: 4.5 mmol/L (ref 3.5–5.2)
Sodium: 139 mmol/L (ref 134–144)
Total Protein: 6.8 g/dL (ref 6.0–8.5)
eGFR: 68 mL/min/{1.73_m2} (ref >59)

## 2023-03-08 ENCOUNTER — Encounter: Payer: Self-pay | Admitting: Urology

## 2023-03-08 NOTE — Progress Notes (Signed)
Letter sent.

## 2023-03-08 NOTE — Patient Instructions (Signed)

## 2023-03-22 ENCOUNTER — Ambulatory Visit: Payer: Medicare Other | Admitting: Gastroenterology

## 2023-03-28 ENCOUNTER — Other Ambulatory Visit: Payer: Self-pay

## 2023-03-28 DIAGNOSIS — Z860100 Personal history of colon polyps, unspecified: Secondary | ICD-10-CM

## 2023-03-28 MED ORDER — PANTOPRAZOLE SODIUM 40 MG PO TBEC: 40.0000 mg | DELAYED_RELEASE_TABLET | Freq: Two times a day (BID) | ORAL | 3 refills | Status: DC

## 2023-05-23 ENCOUNTER — Other Ambulatory Visit: Payer: Self-pay | Admitting: Gastroenterology

## 2023-05-23 DIAGNOSIS — Z860100 Personal history of colon polyps, unspecified: Secondary | ICD-10-CM

## 2023-06-01 ENCOUNTER — Ambulatory Visit: Payer: Medicare Other | Admitting: Urology

## 2023-06-01 VITALS — BP 105/66 | HR 92

## 2023-06-01 DIAGNOSIS — C679 Malignant neoplasm of bladder, unspecified: Secondary | ICD-10-CM

## 2023-06-01 DIAGNOSIS — Z8551 Personal history of malignant neoplasm of bladder: Secondary | ICD-10-CM

## 2023-06-01 LAB — URINALYSIS, ROUTINE W REFLEX MICROSCOPIC
Bilirubin, UA: NEGATIVE
Ketones, UA: NEGATIVE
Leukocytes,UA: NEGATIVE
Nitrite, UA: NEGATIVE
Protein,UA: NEGATIVE
RBC, UA: NEGATIVE
Specific Gravity, UA: 1.01 (ref 1.005–1.030)
Urobilinogen, Ur: 0.2 mg/dL (ref 0.2–1.0)
pH, UA: 5.5 (ref 5.0–7.5)

## 2023-06-01 MED ORDER — CIPROFLOXACIN HCL 500 MG PO TABS
500.0000 mg | ORAL_TABLET | Freq: Once | ORAL | Status: AC
Start: 2023-06-01 — End: 2023-06-01
  Administered 2023-06-01: 500 mg via ORAL

## 2023-06-07 ENCOUNTER — Encounter: Payer: Self-pay | Admitting: Urology

## 2023-06-07 NOTE — Progress Notes (Signed)
   06/01/2023  CC: followup bladder cancer   HPI: Mr Straka is a 73yo here for cystoscopy for bladder cancer Blood pressure 105/66, pulse 92. NED. A&Ox3.   No respiratory distress   Abd soft, NT, ND Normal phallus with bilateral descended testicles  Cystoscopy Procedure Note  Patient identification was confirmed, informed consent was obtained, and patient was prepped using Betadine solution.  Lidocaine  jelly was administered per urethral meatus.     Pre-Procedure: - Inspection reveals a normal caliber ureteral meatus.  Procedure: The flexible cystoscope was introduced without difficulty - No urethral strictures/lesions are present. - Enlarged prostate  - Normal bladder neck - left ureteral orifice surgically absent - Bladder mucosa  reveals no ulcers, tumors, or lesions - No bladder stones - No trabeculation     Post-Procedure: - Patient tolerated the procedure well  Assessment/ Plan: Followup 3 months for cystoscopy  No follow-ups on file.  Belvie Clara, MD

## 2023-06-07 NOTE — Patient Instructions (Signed)

## 2023-06-22 ENCOUNTER — Ambulatory Visit: Payer: Medicare Other | Admitting: Gastroenterology

## 2023-07-19 ENCOUNTER — Encounter: Payer: Self-pay | Admitting: Gastroenterology

## 2023-07-19 ENCOUNTER — Ambulatory Visit: Payer: Medicare Other | Admitting: Gastroenterology

## 2023-07-19 VITALS — BP 96/52 | HR 92 | Ht 72.75 in | Wt 197.5 lb

## 2023-07-19 DIAGNOSIS — R933 Abnormal findings on diagnostic imaging of other parts of digestive tract: Secondary | ICD-10-CM | POA: Diagnosis not present

## 2023-07-19 DIAGNOSIS — K2270 Barrett's esophagus without dysplasia: Secondary | ICD-10-CM | POA: Insufficient documentation

## 2023-07-19 DIAGNOSIS — K2289 Other specified disease of esophagus: Secondary | ICD-10-CM | POA: Diagnosis not present

## 2023-07-19 DIAGNOSIS — K229 Disease of esophagus, unspecified: Secondary | ICD-10-CM | POA: Insufficient documentation

## 2023-07-19 NOTE — Progress Notes (Signed)
 GASTROENTEROLOGY OUTPATIENT CLINIC VISIT   Primary Care Provider Stevphen Rochester, MD [address] [place] [address] [phone]  Referring Provider Dr. Adela Lank  Patient Profile: Andrew [name] is a 73 y.o. male with a pmh significant for CAD, diabetes, hyperlipidemia, hypertension, OSA, arthritis, bladder cancer, GERD, Barrett's esophagus (nondysplastic), esophageal epidermoid metaplasia, colon polyps.  The patient presents to the [facility] for an evaluation and management of problem(s) noted below:  Problem List 1. Barrett's esophagus without dysplasia   2. Epidermoid metaplasia esophagus   3. Abnormal endoscopy of upper gastrointestinal tract    Discussed the use of AI scribe software for clinical note transcription with the patient, who gave verbal consent to proceed.  History of Present Illness Please see prior GI notes for full details of HPI.  Interval History This is the first time I meet this patient; he is a general patient of Dr. Adela Lank.  He is being seen in clinic today to discuss his known Esophageal Epidermoid metaplasia as well as history of Barrett's esophagus.  He has a history of non-dysplastic Barrett's esophagus, monitored with multiple endoscopies.  In 2023, he was also found to have in the distal third of the esophagus, an area of abnormal tissue that returned on sampling as Epidermoid Metaplasia (a rare finding associated as a risk factor towards future development of squamous cell esophageal cancer.  The patient underwent a repeat EGD in 2024 with similar findings in regard to his Barrett's and his Epidermoid metaplasia.  He had no dysplastic changes found on his sampling of the area.  He has no dysphagia symptoms.  As long as he is taking his PPI, he does not have pyrosis symptoms.  If he misses any days without his PPI, he has significant pyrosis and reflux.  He had been prescribed to take his PPI twice daily  but after discussion with his family and his other MDs, he has reduced this to once daily dosing and it has not significantly impacted his symptoms.  He wonders if he can keep this current regimen, especially since he only has a single kidney at this time.  He has no odynophagia symptoms.  No changes in his bowel habits have occured.   GI Review of Systems Positive as above Negative for odynophagia, melena, hematochezia, change in bowel habits  Review of Systems General: Denies fevers/chills/weight loss unintentionally Cardiovascular: Denies chest pain Pulmonary: Denies shortness of breath Gastroenterological: See HPI Genitourinary: Denies darkened urine  Hematological: Positive for easy bruising/bleeding due to aspirin/Plavix long-term use Dermatological: Denies jaundice Psychological: Mood is stable   Medications Current Outpatient Medications  Medication Sig Dispense Refill   aspirin EC 81 MG tablet Take 81 mg by mouth daily. Swallow whole.     Carboxymethylcellul-Glycerin (LUBRICATING EYE DROPS OP) Place 1 drop into both eyes daily as needed (dry eyes).     Cholecalciferol (VITAMIN D) 50 MCG (2000 UT) tablet Take 2,000 Units by mouth 2 (two) times daily.     clopidogrel (PLAVIX) 75 MG tablet Take 75 mg by mouth daily.     diclofenac Sodium (VOLTAREN) 1 % GEL Apply 1 Application topically 4 (four) times daily as needed (pain).     empagliflozin (JARDIANCE) 25 MG TABS tablet Take 25 mg by mouth daily. 90 mg 3   fenofibrate (TRICOR) 48 MG tablet Take 1 tablet (48 mg total) by mouth daily. 90 tablet 3   furosemide (LASIX) 40 MG tablet Take 20 mg by mouth daily.  glimepiride (AMARYL) 2 MG tablet Take 2 mg by mouth daily before breakfast.     insulin degludec (TRESIBA FLEXTOUCH) 100 UNIT/ML FlexTouch Pen Inject 14 Units into the skin daily.     isosorbide mononitrate (IMDUR) 30 MG 24 hr tablet Take 1 tablet (30 mg total) by mouth daily. (Patient taking differently: Take 15 mg by mouth  every evening.) 90 tablet 3   levothyroxine (SYNTHROID) 50 MCG tablet Take 50 mcg by mouth daily before breakfast.     magnesium hydroxide (DULCOLAX) 400 MG/5ML suspension Take 15-30 mLs by mouth as needed for mild constipation.     metFORMIN (GLUCOPHAGE) 1000 MG tablet Take 1 tablet by mouth 2 times daily with a meal. (Patient taking differently: Take 1,000 mg by mouth 2 (two) times daily with a meal.) 180 tablet 3   metoprolol succinate (TOPROL-XL) 25 MG 24 hr tablet Take 25 mg by mouth daily.     Multiple Vitamins-Minerals (PRESERVISION AREDS 2) CAPS Take 1 capsule by mouth 2 (two) times daily.     nitroGLYCERIN (NITROSTAT) 0.4 MG SL tablet PLACE 1 TABLET UNDER THE TONGUE AT ONSET OF CHEST PAIN EVERY 5 MINTUES UP TO 3 TIMES AS NEEDED 25 tablet 0   ONETOUCH VERIO test strip daily.     oxyCODONE-acetaminophen (PERCOCET) 5-325 MG tablet Take 1 tablet by mouth every 4 (four) hours as needed for severe pain. 15 tablet 0   pantoprazole (PROTONIX) 40 MG tablet TAKE ONE TABLET TWICE DAILY (Patient taking differently: Take 40 mg by mouth daily.) 180 tablet 1   primidone (MYSOLINE) 50 MG tablet Take 1 tablet (50 mg total) by mouth 4 (four) times daily. (Patient taking differently: Take 100 mg by mouth 4 (four) times daily.) 360 tablet 3   ranolazine (RANEXA) 500 MG 12 hr tablet Take 500 mg by mouth 2 (two) times daily.     rosuvastatin (CRESTOR) 40 MG tablet Take 1 tablet (40 mg total) by mouth daily. 90 tablet 3   tamsulosin (FLOMAX) 0.4 MG CAPS capsule Take 1 capsule (0.4 mg total) by mouth daily. 90 capsule 3   No current facility-administered medications for this visit.    Allergies Allergies  Allergen Reactions   Tramadol Shortness Of Breath    And dizziness   Benadryl [Diphenhydramine] Other (See Comments)    Patient was "knocked out for 3 days after surgery" patient received benadryl for severe itching after surgery   Betadine [Povidone Iodine] Itching    Patient had severe itching after  surgery in the area betadine was used   Trulicity [Dulaglutide] Swelling    Ankles and feet swell    Histories Past Medical History:  Diagnosis Date   Acute viral pericarditis 09/03/2018   Inferior STE - but Negative Troponin.  CP &SOB.  Felt to be Viral.   Anemia    Arthritis    Bladder cancer (HCC) 2014   CAD (coronary artery disease)    Cataract    bilateral   Complication of anesthesia    Depression    Diabetes mellitus without complication (HCC)    Essential hypertension 01/20/2018   in the past no longer on medication   Family history of breast cancer    Family history of prostate cancer    Family history of testicular cancer    GERD (gastroesophageal reflux disease)    Heart attack (HCC) 03/2022   Hx of adenomatous colonic polyps 09/2017   colonoscopy   Hyperlipidemia with target LDL less than 70 12/19/2015   Intraoperative  floppy iris syndrome (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 DES PCI RI, PTCA of RPDA 01/21/2018   12/2017 - Cath for ? STEMI -> distal/apical LAD & m-dCx CTO (unable to cross Cx).  Mod rPDA. Severe RI - DES PCI.  08/2018 - Cath for ? Inf STEMI - RI stent patent & CTO dLAD/mCx. Progression of rPDA 95% -> PTCA only.  Thought to be Pericarditis & not MI (troponin negative).    Neuromuscular disorder (HCC)    Polyposis of colon 01/25/2023   Sleep apnea    BiPAP not currently being used   Status post tendon repair 1989   STEMI (ST elevation myocardial infarction) (HCC) 01/20/2018   Cardiac cath January 21, 2018: Severe multivessel disease with unclear lesion, but opted for PCI/DES x1 to the RI.  Appeared to have CTO of the distal LAD and circumflex as well as moderate mid LAD and diagonal disease as well as PDA.Marland Kitchen  Unable to cross circumflex lesion.   STEMI (ST elevation myocardial infarction) (HCC) 03/2022   Stroke (HCC) 11/2015   At times pt has dizziness with loss of vision   Tremors of nervous system 2011   Past  Surgical History:  Procedure Laterality Date   APPENDECTOMY     BLADDER SURGERY     COLONOSCOPY     CORONARY STENT INTERVENTION N/A 01/21/2018   Procedure: CORONARY STENT INTERVENTION;  Surgeon: Marykay Lex, MD;  Location: [facility] INVASIVE CV LAB;  Service: Cardiovascular;  Laterality: N/A;  95% Ramus Intermedius -PCI with synergy DES 2.25 mm x 12 mm postdilated 2.4 mm.   CORONARY/GRAFT ACUTE MI REVASCULARIZATION N/A 01/21/2018   Procedure: Coronary/Graft Acute MI Revascularization;  Surgeon: Marykay Lex, MD;  Location: [facility] INVASIVE CV LAB;  Service: Cardiovascular;  Laterality: N/A;  attempted revas to distal CFX;    CORONARY/GRAFT ACUTE MI REVASCULARIZATION N/A 09/04/2018   Procedure: Coronary/Graft Acute MI Revascularization;  Surgeon: Tonny Bollman, MD;  Location: [facility] INVASIVE CV LAB:: PTCA/POBA of rPDA (2.0 mm balloon)   CYSTOSCOPY N/A 01/21/2020   Procedure: CYSTOSCOPY;  Surgeon: Malen Gauze, MD;  Location: [facility];  Service: Urology;  Laterality: N/A;   CYSTOSCOPY N/A 02/09/2021   Procedure: CYSTOSCOPY;  Surgeon: Malen Gauze, MD;  Location: [facility];  Service: Urology;  Laterality: N/A;   CYSTOSCOPY Left 11/08/2022   Procedure: CYSTOSCOPY;  Surgeon: Malen Gauze, MD;  Location: [facility];  Service: Urology;  Laterality: Left;   CYSTOSCOPY W/ RETROGRADES Bilateral 08/22/2015   Procedure: CYSTOSCOPY WITH RETROGRADE PYELOGRAM;  Surgeon: Bjorn Pippin, MD;  Location: [facility];  Service: Urology;  Laterality: Bilateral;   CYSTOSCOPY W/ RETROGRADES Bilateral 01/16/2016   Procedure: CYSTOSCOPY WITH RETROGRADE PYELOGRAM;  Surgeon: Bjorn Pippin, MD;  Location: [facility];  Service: Urology;  Laterality: Bilateral;   CYSTOSCOPY W/ RETROGRADES Bilateral 01/07/2017   Procedure: CYSTOSCOPY WITH RETROGRADE PYELOGRAM;  Surgeon: Bjorn Pippin, MD;  Location: [facility];  Service: Urology;  Laterality: Bilateral;   CYSTOSCOPY WITH BIOPSY N/A 08/22/2015   Procedure: CYSTOSCOPY WITH BLADDER BIOPSY;  Surgeon:  Bjorn Pippin, MD;  Location: [facility];  Service: Urology;  Laterality: N/A;   CYSTOSCOPY WITH BIOPSY N/A 01/16/2016   Procedure: CYSTOSCOPY WITH BLADDER BIOPSY;  Surgeon: Bjorn Pippin, MD;  Location: [facility];  Service: Urology;  Laterality: N/A;   CYSTOSCOPY WITH FULGERATION N/A 01/16/2016   Procedure: CYSTOSCOPY WITH FULGERATION;  Surgeon: Bjorn Pippin, MD;  Location: [facility];  Service: Urology;  Laterality:  N/A;   CYSTOSCOPY/RETROGRADE/URETEROSCOPY Left 09/06/2022   Procedure: CYSTOSCOPY/RETROGRADE/URETEROSCOPY/STENT PLACEMENT;  Surgeon: Malen Gauze, MD;  Location: [facility];  Service: Urology;  Laterality: Left;   KNEE ARTHROSCOPY Right 1989   LEFT HEART CATH AND CORONARY ANGIOGRAPHY N/A 01/21/2018   Procedure: LEFT HEART CATH AND CORONARY ANGIOGRAPHY;  Surgeon: Marykay Lex, MD;  Location: [facility] INVASIVE CV LAB;  Service: Cardiovascular;; 95% RI-DES PCI.  80% o-pCX (PTCA) -> dCX 80%-100% CTO (unsuccessful PTCA unable to cross distal lesion with balloon.)  100% CTO dLAD. RPDA 80% and 70% as well as P AV 180%, PA V2 60%.  (too small for PCI)   LEFT HEART CATH AND CORONARY ANGIOGRAPHY N/A 09/04/2018   Procedure: LEFT HEART CATH AND CORONARY ANGIOGRAPHY;  Surgeon: Tonny Bollman, MD;  Location: [facility] INVASIVE CV LAB: (presumed Inferior STEMI) = progression of small rPDA -95% (PTCA only). Patent RI stent. CTO of apical LAD & mCx.   ROBOT ASSITED LAPAROSCOPIC NEPHROURETERECTOMY Left 11/08/2022   Procedure: XI ROBOT ASSITED LAPAROSCOPIC NEPHROURETERECTOMY;  Surgeon: Malen Gauze, MD;  Location: [facility];  Service: Urology;  Laterality: Left;   ROTATOR CUFF REPAIR Bilateral 2000   rt. leg fracture surgery     TRANSTHORACIC ECHOCARDIOGRAM  01/21/2018   Mild LVH.  EF 50 and 55%.  Apical inferior hypokinesis.  Mid-apical anterolateral hypokinesis.  Normal diastolic function for age    TRANSTHORACIC ECHOCARDIOGRAM  09/04/2018   -? Inf STEMI vs. Pericarditis:  Mildly reduced EF of 45 to 50%.  Impaired  relaxation-GR 1 DD.  Severe hypokinesis of the anterolateral and inferolateral wall.  Small-moderate anterior pericardial effusion.  Moderate aortic sclerosis but no stenosis.   TRANSURETHRAL RESECTION OF BLADDER TUMOR N/A 01/07/2017   Procedure: TRANSURETHRAL RESECTION OF BLADDER TUMOR (TURBT);  Surgeon: Bjorn Pippin, MD;  Location: [facility];  Service: Urology;  Laterality: N/A;   TRANSURETHRAL RESECTION OF BLADDER TUMOR N/A 01/21/2020   Procedure: TRANSURETHRAL RESECTION OF BLADDER TUMOR (TURBT);  Surgeon: Malen Gauze, MD;  Location: [facility];  Service: Urology;  Laterality: N/A;   TRANSURETHRAL RESECTION OF BLADDER TUMOR N/A 02/09/2021   Procedure: TRANSURETHRAL RESECTION OF BLADDER TUMOR (TURBT);  Surgeon: Malen Gauze, MD;  Location: [facility];  Service: Urology;  Laterality: N/A;   URETERAL BIOPSY Left 09/06/2022   Procedure: URETERAL BIOPSY/fulgeration;  Surgeon: Malen Gauze, MD;  Location: [facility];  Service: Urology;  Laterality: Left;   WISDOM TOOTH EXTRACTION     Social History   Socioeconomic History   Marital status: Single    Spouse name: Not on file   Number of children: 1   Years of education: some college   Highest education level: Some college, no degree  Occupational History   Occupation: retired    Comment: Holiday representative, some farming  Tobacco Use   Smoking status: Former    Current packs/day: 1.00    Average packs/day: 1 pack/day for 50.0 years (50.0 ttl pk-yrs)    Types: Cigarettes   Smokeless tobacco: Former  Building services engineer status: Never Used  Substance and Sexual Activity   Alcohol use: No    Comment: rarely   Drug use: No   Sexual activity: Never  Other Topics Concern   Not on file  Social History Narrative   Dayven is retired and lives alone. He has a daughter that lives locally that he sees regularly. He has a couple of horses and an outside dog. He enjoys working outside and taking care of his horses.  Social Drivers of Manufacturing engineer Strain: Low Risk  (05/16/2023)   Received from [facility]   Overall Financial Resource Strain (CARDIA)    Difficulty of Paying Living Expenses: Not hard at all  Food Insecurity: No Food Insecurity (05/16/2023)   Received from [facility]   Hunger Vital Sign    Worried About Running Out of Food in the Last Year: Never true    Ran Out of Food in the Last Year: Never true  Transportation Needs: No Transportation Needs (05/16/2023)   Received from [facility] - Transportation    Lack of Transportation (Medical): No    Lack of Transportation (Non-Medical): No  Physical Activity: Unknown (05/16/2023)   Received from [facility]   Exercise Vital Sign    Days of Exercise per Week: 0 days    Minutes of Exercise per Session: Not on file  Stress: No Stress Concern Present (04/20/2022)   Received from [facility] of Occupational Health - Occupational Stress Questionnaire    Feeling of Stress : Not at all  Recent Concern: Stress - Stress Concern Present (03/25/2022)   Received from [facility] of Occupational Health - Occupational Stress Questionnaire    Feeling of Stress : To some extent  Social Connections: Socially Integrated (05/16/2023)   Received from [facility]   Social Network    How would you rate your social network (family, work, friends)?: Good participation with social networks  Intimate Partner Violence: Not At Risk (11/08/2022)   Humiliation, Afraid, Rape, and Kick questionnaire    Fear of Current or Ex-Partner: No    Emotionally Abused: No    Physically Abused: No    Sexually Abused: No   Family History  Problem Relation Age of Onset   Diabetes Mother 53       type 1   Stroke Mother    Dementia Father    Diabetes Brother    Heart attack Brother    Diabetes Brother    Testicular cancer Brother        dx. 50s   Breast cancer Maternal Aunt    Lung cancer Maternal  Uncle    Bladder Cancer Paternal Grandmother    Prostate cancer Paternal Grandfather    Colon cancer Neg Hx    Colon polyps Neg Hx    Esophageal cancer Neg Hx    Rectal cancer Neg Hx    Stomach cancer Neg Hx    Pancreatic cancer Neg Hx    Inflammatory bowel disease Neg Hx    Liver disease Neg Hx    I have reviewed his medical, social, and family history in detail and updated the electronic medical record as necessary.    PHYSICAL EXAMINATION  BP (!) 96/52 (BP Location: Left Arm, Patient Position: Sitting, Cuff Size: Normal)   Pulse 92   Ht 6' 0.75" (1.848 m) Comment: height measured without shoes  Wt 197 lb 8 oz (89.6 kg)   BMI 26.24 kg/m  Wt Readings from Last 3 Encounters:  07/19/23 197 lb 8 oz (89.6 kg)  02/21/23 180 lb (81.6 kg)  01/03/23 180 lb 4 oz (81.8 kg)  GEN: NAD, appears stated age, doesn't appear chronically ill, accompanied by daughter PSYCH: Cooperative, without pressured speech EYE: Conjunctivae pink, sclerae anicteric ENT: MMM CV: Nontachycardic RESP: No audible wheezing GI: NABS, soft, NT/ND, without rebound or guarding MSK/EXT: No significant lower extremity edema SKIN: No jaundice  NEURO:  Alert & Oriented x 3, no focal deficits   REVIEW OF DATA  I reviewed the following data at the time of this encounter:  GI Procedures and Studies  September 2024 EGD - Esophagogastric landmarks identified. - 2 cm hiatal hernia. - Short segment of Barrett's esophagus. Biopsied. - A single plaque in the lower third of the esophagus similar in size / location as the last exam, likely epidermoid metaplasia. Biopsied. - Normal stomach. - Normal examined duodenum.  Pathology FINAL DIAGNOSIS      1. Surgical [P], distal esophagus :      - BARRETT MUCOSA, NO DYSPLASIA      2. Surgical [P], distal esophagus at 35 :      - SQUAMOUS MUCOSA WITH HYPERKERATOSIS AND FOCALLY PROMINENT GRANULAR LAYER.SEE      NOTE.      Diagnosis Note : - The findings are consistent with  epidermoid metaplasia in the      appropriate clinical context.There is no evidence of sebaceous metaplasia,      intestinal metaplasia, dysplasia or malignancy.This part was reviewed with Dr.Lu      who agrees with the above interpretation.  January 2023 upper endoscopy - Esophagogastric landmarks identified. - 2 cm hiatal hernia. - Esophageal mucosal changes classified as Barrett's stage C0-M2 per Prague criteria. Biopsied. - A single plaque in the lower third of the esophagus. Biopsied. - Gastritis. - Normal duodenal bulb and second portion of the duodenum. - Biopsies were taken with a cold forceps for Helicobacter pylori testing.  Pathology Diagnosis 1. Surgical [P], gastric antrum and gastric body - GASTRIC OXYNTIC MUCOSA WITH PARIETAL CELL HYPERPLASIA AS CAN BE SEEN IN HYPERGASTRINEMIC STATES SUCH AS PPI THERAPY. - GASTRIC ANTRAL MUCOSA WITH NO SPECIFIC HISTOPATHOLOGIC CHANGES - HELICOBACTER PYLORI-LIKE ORGANISMS ARE NOT IDENTIFIED ON ROUTINE H&E STAIN 2. Surgical [P], distal esophagus - BARRETTS ESOPHAGUS, NEGATIVE FOR DYSPLASIA 3. Surgical [P], distal esophagus at 35 cm ESOPHAGEAL MUCOSA WITH EPIDERMOID METAPLASIA. SEE NOTE  Laboratory Studies  Reviewed those in epic  Imaging Studies  No relevant studies to review   ASSESSMENT  Mr. Craighead is a 73 y.o. male with a pmh significant for CAD, diabetes, hyperlipidemia, hypertension, OSA, arthritis, bladder cancer, GERD, Barrett's esophagus (nondysplastic), esophageal epidermoid metaplasia, colon polyps.  The patient is seen today for evaluation and management of:  1. Barrett's esophagus without dysplasia   2. Epidermoid metaplasia esophagus   3. Abnormal endoscopy of upper gastrointestinal tract    The patient is clinically and hemodynamically stable at this time.  Long discussion today about epidermoid metaplasia and it being a risk factor towards the development of squamous cell esophageal cancers.  We do not have as much  data about this type of esophageal lesion as opposed to Barrett's esophagus, which she also does have but is nondysplastic.  In the literature endoscopic surveillance versus endoscopic therapy such as EMR and ablative techniques such as RFA have been used to try to decrease the risk of progression of epidermoid metaplasia.  We thoroughly discussed the therapeutic management options.  The patient and daughter would like to continue active surveillance for now unless something else develops or changes in regards to biopsy sampling.  I do not think that this is unreasonable if he continues to have close monitoring, but things can change sometimes very quickly so the development of cancer in the future has to still be considered a possibility.  I recommend he have a 1 year follow-up endoscopy with his primary gastroenterologist  to ensure no significant changes, and sample does show evidence of developing dysplasia.  If they show no evidence of dysplasia then continued monitoring every couple of years should be considered.  If any evidence of dysplasia or further enlargement of the growth of the epidermoid metaplasia, then I would recommend therapeutic techniques to be strongly considered.  I am happy to be available in the future for this.  We also did discuss Barrett's esophagus again in greater detail which was helpful for him and his daughter.  All patient questions were answered to the best of my ability, and the patient agrees to the aforementioned plan of action with follow-up as indicated.   PLAN  After extensive discussion patient has elected to continue active surveillance of the epidermoid metaplasia of the esophagus EGD recall with Dr. Adela Lank in late fall 2025 I am happy to be available in the future for therapeutic interventions if needed or if patient changes mind   No orders of the defined types were placed in this encounter.   New Prescriptions   No medications on file   Modified  Medications   No medications on file    Planned Follow Up No follow-ups on file.   Total Time in Face-to-Face and in Coordination of Care for patient including independent/personal interpretation/review of prior testing, medical history, examination, medication adjustment, communicating results with the patient directly, and documentation within the EHR is 30 minutes.   Corliss Parish, MD [facility] Advanced Endoscopy Office # [phone]

## 2023-07-19 NOTE — Patient Instructions (Addendum)
 You will be due for a recall Endoscopy in Sept /October With Dr Adela Lank. We will send you a reminder in the mail when it gets closer to that time.  _______________________________________________________  If your blood pressure at your visit was 140/90 or greater, please contact your primary care physician to follow up on this.  _______________________________________________________  If you are age 74 or older, your body mass index should be between 23-30. Your Body mass index is 26.24 kg/m. If this is out of the aforementioned range listed, please consider follow up with your Primary Care Provider.  If you are age 64 or younger, your body mass index should be between 19-25. Your Body mass index is 26.24 kg/m. If this is out of the aformentioned range listed, please consider follow up with your Primary Care Provider.   ________________________________________________________  The [facility] providers would like to encourage you to use [facility] to communicate with providers for non-urgent requests or questions.  Due to long hold times on the telephone, sending your provider a message by [facility] may be a faster and more efficient way to get a response.  Please allow 48 business hours for a response.  Please remember that this is for non-urgent requests.  _______________________________________________________  Thank you for choosing me and [facility].  Dr. Meridee Score

## 2023-08-31 ENCOUNTER — Other Ambulatory Visit: Payer: Medicare Other | Admitting: Urology

## 2023-09-12 ENCOUNTER — Ambulatory Visit: Attending: Family Medicine

## 2023-09-12 DIAGNOSIS — R2681 Unsteadiness on feet: Secondary | ICD-10-CM | POA: Insufficient documentation

## 2023-09-12 DIAGNOSIS — M6281 Muscle weakness (generalized): Secondary | ICD-10-CM | POA: Diagnosis present

## 2023-09-12 NOTE — Therapy (Signed)
 OUTPATIENT PHYSICAL THERAPY LOWER EXTREMITY EVALUATION   Patient Name: Andrew [name] MRN: [id number] DOB:Sep 12, 1950, 73 y.o., male Today's Date: 09/12/2023  END OF SESSION:  PT End of Session - 09/12/23 1348     Visit Number 1    Number of Visits 12    Date for PT Re-Evaluation 11/18/23    PT Start Time 1355    PT Stop Time 1428    PT Time Calculation (min) 33 min    Activity Tolerance Patient tolerated treatment well    Behavior During Therapy [facility] for tasks assessed/performed             Past Medical History:  Diagnosis Date   Acute viral pericarditis 09/03/2018   Inferior STE - but Negative Troponin.  CP &SOB.  Felt to be Viral.   Anemia    Arthritis    Bladder cancer (HCC) 2014   CAD (coronary artery disease)    Cataract    bilateral   Complication of anesthesia    Depression    Diabetes mellitus without complication (HCC)    Essential hypertension 01/20/2018   in the past no longer on medication   Family history of breast cancer    Family history of prostate cancer    Family history of testicular cancer    GERD (gastroesophageal reflux disease)    Heart attack (HCC) 03/2022   Hx of adenomatous colonic polyps 09/2017   colonoscopy   Hyperlipidemia with target LDL less than 70 12/19/2015   Intraoperative floppy iris syndrome (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 DES PCI RI, PTCA of RPDA 01/21/2018   12/2017 - Cath for ? STEMI -> distal/apical LAD & m-dCx CTO (unable to cross Cx).  Mod rPDA. Severe RI - DES PCI.  08/2018 - Cath for ? Inf STEMI - RI stent patent & CTO dLAD/mCx. Progression of rPDA 95% -> PTCA only.  Thought to be Pericarditis & not MI (troponin negative).    Neuromuscular disorder (HCC)    Polyposis of colon 01/25/2023   Sleep apnea    BiPAP not currently being used   Status post tendon repair 1989   STEMI (ST elevation myocardial infarction) (HCC) 01/20/2018   Cardiac cath January 21, 2018: Severe  multivessel disease with unclear lesion, but opted for PCI/DES x1 to the RI.  Appeared to have CTO of the distal LAD and circumflex as well as moderate mid LAD and diagonal disease as well as PDA.[name]  Unable to cross circumflex lesion.   STEMI (ST elevation myocardial infarction) (HCC) 03/2022   Stroke (HCC) 11/2015   At times pt has dizziness with loss of vision   Tremors of nervous system 2011   Past Surgical History:  Procedure Laterality Date   APPENDECTOMY     BLADDER SURGERY     COLONOSCOPY     CORONARY STENT INTERVENTION N/A 01/21/2018   Procedure: CORONARY STENT INTERVENTION;  Surgeon: Arleen Lacer, MD;  Location: [facility] INVASIVE CV LAB;  Service: Cardiovascular;  Laterality: N/A;  95% Ramus Intermedius -PCI with synergy DES 2.25 mm x 12 mm postdilated 2.4 mm.   CORONARY/GRAFT ACUTE MI REVASCULARIZATION N/A 01/21/2018   Procedure: Coronary/Graft Acute MI Revascularization;  Surgeon: Arleen Lacer, MD;  Location: [facility] INVASIVE CV LAB;  Service: Cardiovascular;  Laterality: N/A;  attempted revas to distal CFX;    CORONARY/GRAFT ACUTE MI REVASCULARIZATION N/A 09/04/2018   Procedure: Coronary/Graft Acute MI Revascularization;  Surgeon: Arnoldo Lapping, MD;  Location: [facility] INVASIVE CV LAB:: PTCA/POBA of rPDA (2.0 mm balloon)   CYSTOSCOPY N/A 01/21/2020   Procedure: CYSTOSCOPY;  Surgeon: Marco Severs, MD;  Location: [facility];  Service: Urology;  Laterality: N/A;   CYSTOSCOPY N/A 02/09/2021   Procedure: CYSTOSCOPY;  Surgeon: Marco Severs, MD;  Location: [facility];  Service: Urology;  Laterality: N/A;   CYSTOSCOPY Left 11/08/2022   Procedure: CYSTOSCOPY;  Surgeon: Marco Severs, MD;  Location: [facility];  Service: Urology;  Laterality: Left;   CYSTOSCOPY W/ RETROGRADES Bilateral 08/22/2015   Procedure: CYSTOSCOPY WITH RETROGRADE PYELOGRAM;  Surgeon: Homero Luster, MD;  Location: [facility];  Service: Urology;  Laterality: Bilateral;   CYSTOSCOPY W/ RETROGRADES Bilateral 01/16/2016   Procedure:  CYSTOSCOPY WITH RETROGRADE PYELOGRAM;  Surgeon: Homero Luster, MD;  Location: [facility];  Service: Urology;  Laterality: Bilateral;   CYSTOSCOPY W/ RETROGRADES Bilateral 01/07/2017   Procedure: CYSTOSCOPY WITH RETROGRADE PYELOGRAM;  Surgeon: Homero Luster, MD;  Location: [facility];  Service: Urology;  Laterality: Bilateral;   CYSTOSCOPY WITH BIOPSY N/A 08/22/2015   Procedure: CYSTOSCOPY WITH BLADDER BIOPSY;  Surgeon: Homero Luster, MD;  Location: [facility];  Service: Urology;  Laterality: N/A;   CYSTOSCOPY WITH BIOPSY N/A 01/16/2016   Procedure: CYSTOSCOPY WITH BLADDER BIOPSY;  Surgeon: Homero Luster, MD;  Location: [facility];  Service: Urology;  Laterality: N/A;   CYSTOSCOPY WITH FULGERATION N/A 01/16/2016   Procedure: CYSTOSCOPY WITH FULGERATION;  Surgeon: Homero Luster, MD;  Location: [facility];  Service: Urology;  Laterality: N/A;   CYSTOSCOPY/RETROGRADE/URETEROSCOPY Left 09/06/2022   Procedure: CYSTOSCOPY/RETROGRADE/URETEROSCOPY/STENT PLACEMENT;  Surgeon: Marco Severs, MD;  Location: [facility];  Service: Urology;  Laterality: Left;   KNEE ARTHROSCOPY Right 1989   LEFT HEART CATH AND CORONARY ANGIOGRAPHY N/A 01/21/2018   Procedure: LEFT HEART CATH AND CORONARY ANGIOGRAPHY;  Surgeon: Arleen Lacer, MD;  Location: [facility] INVASIVE CV LAB;  Service: Cardiovascular;; 95% RI-DES PCI.  80% o-pCX (PTCA) -> dCX 80%-100% CTO (unsuccessful PTCA unable to cross distal lesion with balloon.)  100% CTO dLAD. RPDA 80% and 70% as well as P AV 180%, PA V2 60%.  (too small for PCI)   LEFT HEART CATH AND CORONARY ANGIOGRAPHY N/A 09/04/2018   Procedure: LEFT HEART CATH AND CORONARY ANGIOGRAPHY;  Surgeon: Arnoldo Lapping, MD;  Location: [facility] INVASIVE CV LAB: (presumed Inferior STEMI) = progression of small rPDA -95% (PTCA only). Patent RI stent. CTO of apical LAD & mCx.   ROBOT ASSITED LAPAROSCOPIC NEPHROURETERECTOMY Left 11/08/2022   Procedure: XI ROBOT ASSITED LAPAROSCOPIC NEPHROURETERECTOMY;  Surgeon: Marco Severs, MD;  Location: [facility];   Service: Urology;  Laterality: Left;   ROTATOR CUFF REPAIR Bilateral 2000   rt. leg fracture surgery     TRANSTHORACIC ECHOCARDIOGRAM  01/21/2018   Mild LVH.  EF 50 and 55%.  Apical inferior hypokinesis.  Mid-apical anterolateral hypokinesis.  Normal diastolic function for age    TRANSTHORACIC ECHOCARDIOGRAM  09/04/2018   -? Inf STEMI vs. Pericarditis:  Mildly reduced EF of 45 to 50%.  Impaired relaxation-GR 1 DD.  Severe hypokinesis of the anterolateral and inferolateral wall.  Small-moderate anterior pericardial effusion.  Moderate aortic sclerosis but no stenosis.   TRANSURETHRAL RESECTION OF BLADDER TUMOR N/A 01/07/2017   Procedure: TRANSURETHRAL RESECTION OF BLADDER TUMOR (TURBT);  Surgeon: Homero Luster, MD;  Location: [facility];  Service: Urology;  Laterality: N/A;   TRANSURETHRAL RESECTION OF BLADDER TUMOR N/A 01/21/2020   Procedure: TRANSURETHRAL RESECTION OF BLADDER TUMOR (TURBT);  Surgeon: Marco Severs, MD;  Location: [facility];  Service: Urology;  Laterality: N/A;   TRANSURETHRAL RESECTION OF BLADDER TUMOR N/A 02/09/2021   Procedure: TRANSURETHRAL RESECTION OF BLADDER TUMOR (TURBT);  Surgeon: Marco Severs, MD;  Location: [facility];  Service: Urology;  Laterality: N/A;   URETERAL BIOPSY Left 09/06/2022   Procedure: URETERAL BIOPSY/fulgeration;  Surgeon: Marco Severs, MD;  Location: [facility];  Service: Urology;  Laterality: Left;   WISDOM TOOTH EXTRACTION     Patient Active Problem List   Diagnosis Date Noted   Epidermoid metaplasia esophagus 07/19/2023   Barrett's esophagus without dysplasia 07/19/2023   Abnormal endoscopy of upper gastrointestinal tract 07/19/2023   Genetic testing 02/10/2023   Polyposis of colon 01/25/2023   Family history of prostate cancer    Family history of testicular cancer    Family history of breast cancer    Ureteral cancer, left (HCC) 11/08/2022   Left renal mass 09/06/2022   History of pericarditis 04/26/2019   History of ST elevation  myocardial infarction (STEMI) 09/05/2018   Unstable angina (HCC) 09/04/2018   Presence of drug coated stent in ramus intermedius coronary artery 05/12/2018   COPD with acute exacerbation (HCC) 01/21/2018   Multivessel CAD - CTO dLAD, mCx. DES PCI RI, PTCA of RPDA 01/21/2018   Stable angina (HCC) 01/20/2018   Depression 01/20/2018   Essential hypertension 01/20/2018   Sleep apnea 10/06/2016   Tobacco use disorder 12/19/2015   Type 2 diabetes mellitus with circulatory disorder, without long-term current use of insulin  (HCC) 12/19/2015   Hyperlipidemia with target LDL less than 70 12/19/2015   Bladder cancer (HCC) 12/19/2015   Esophageal reflux 12/19/2015   Essential tremor 12/19/2015   History of stroke 12/19/2015    PCP: Authur Leghorn, MD   REFERRING PROVIDER: Authur Leghorn, MD   REFERRING DIAG: poor balance  THERAPY DIAG:  Unsteadiness on feet  Muscle weakness (generalized)  Rationale for Evaluation and Treatment: Rehabilitation  ONSET DATE: Winter 2024  SUBJECTIVE:   SUBJECTIVE STATEMENT: Patient reports that he was doing good after coming to physical therapy last year. However, he felt like he got worse over the winter when he could not go outside. He is unable to go outside especially when it is windy. He has had two falls in the past six months. One fall was about one month ago in his bathtub. He was unable to recall the other fall. He has multiple close calls, but he was able to catch himself. He had a brain scan when showed some atrophy in his brain. He was told that this may be the beginning of Alzheimer or Dementia.He has noticed that he can easily lose his balance when he bends over to try and pick things up off the floor.   PERTINENT HISTORY: Type 2 diabetes, angina, hypertension, COPD, history of cancer, history of a stroke, history of a myocardial infarction, tremors, arthritis, and depression PAIN:  Are you having pain? Yes: NPRS scale: 5-6/10 Pain  location: back, shoulders, and knees Pain description: aching, sore   PRECAUTIONS: Fall  RED FLAGS: None   WEIGHT BEARING RESTRICTIONS: No  FALLS:  Has patient fallen in last 6 months? Yes. Number of falls 2  LIVING ENVIRONMENT: Lives with: lives alone Lives in: House/apartment Stairs: Yes: Internal: 1 steps; none Has following equipment at home: Ramped entry  OCCUPATION: retired  PLOF: Independent with basic ADLs  PATIENT GOALS: improved safety, be able to use his chainsaw, improved strength, and be able to pick up things off  the floor  NEXT MD VISIT: unsure  OBJECTIVE:  Note: Objective measures were completed at Evaluation unless otherwise noted.  COGNITION: Overall cognitive status: Within functional limits for tasks assessed     SENSATION: Light touch: no significant deficits noted Patient reports numbness in both legs with the left being worse and more frequent than the right  LOWER EXTREMITY ROM: WFL for activities assessed  LOWER EXTREMITY MMT:  MMT Right eval Left eval  Hip flexion 4-/5; hip pain 3+/5  Hip extension    Hip abduction    Hip adduction    Hip internal rotation    Hip external rotation    Knee flexion 4/5 5/5  Knee extension 3+/5 5/5  Ankle dorsiflexion 3/5 3/5  Ankle plantarflexion    Ankle inversion    Ankle eversion     (Blank rows = not tested)  FUNCTIONAL TESTS:  5 times sit to stand: 1:05 with BUE support from armrests Timed up and go (TUG): 24.17 seconds 30 second sit to stand: 3 reps  GAIT: Assistive device utilized: None Level of assistance: Complete Independence Comments: Decreased gait speed with poor lateral stability and foot clearance bilaterally                                                                                                                                TREATMENT DATE:     PATIENT EDUCATION:  Education details: Plan of care, prognosis, objective findings, and goals for physical  therapy Person educated: Patient Education method: Explanation Education comprehension: verbalized understanding  HOME EXERCISE PROGRAM:   ASSESSMENT:  CLINICAL IMPRESSION: Patient is a 73 y.o. male who was seen today for physical therapy evaluation and treatment for deconditioning and a history of falling. He is a high fall risk as evidenced by his objective measures, gait mechanics, and his history of falling. Recommend that he continue with his skilled physical therapy to address his remaining impairments to maximize his safety and functional mobility.    OBJECTIVE IMPAIRMENTS: Abnormal gait, decreased activity tolerance, decreased balance, decreased mobility, difficulty walking, decreased strength, and pain.   ACTIVITY LIMITATIONS: carrying, lifting, bending, standing, stairs, transfers, bathing, and locomotion level  PARTICIPATION LIMITATIONS: meal prep, cleaning, laundry, shopping, community activity, and yard work  PERSONAL FACTORS: Age, Past/current experiences, Time since onset of injury/illness/exacerbation, and 3+ comorbidities: Type 2 diabetes, angina, hypertension, COPD, history of cancer, history of a stroke, history of a myocardial infarction, tremors, arthritis, and depression  are also affecting patient's functional outcome.   REHAB POTENTIAL: Fair    CLINICAL DECISION MAKING: Evolving/moderate complexity  EVALUATION COMPLEXITY: Moderate   GOALS: Goals reviewed with patient? Yes  SHORT TERM GOALS: Target date: 10/03/23 Patient will be independent with his initial HEP. Baseline: Goal status: INITIAL  2.  Patient will improve his timed up and go time to 19 seconds or less for improved functional mobility. Baseline:  Goal status: INITIAL  3.  Patient will improve his  5 times sit to stand time to 45 seconds or less for improved lower extremity power. Baseline:  Goal status: INITIAL  LONG TERM GOALS: Target date: 10/24/23  Patient will be independent with his  advanced HEP. Baseline:  Goal status: INITIAL  2.  Patient will be able to safely pick up items from the floor for improved household independence. Baseline:  Goal status: INITIAL  3.  Patient will improve his 5 times sit to stand time to 30 seconds or less for improved lower extremity power. Baseline:  Goal status: INITIAL  4.  Patient will improve his timed up and go time to 15 seconds or less to reduce his fall risk. Baseline:  Goal status: INITIAL  5.  Patient will improve his 30 second sit to stand test to at least 5 repetitions for improved lower extremity power needed for transfers. Baseline:  Goal status: INITIAL  PLAN:  PT FREQUENCY: 2x/week  PT DURATION: 6 weeks  PLANNED INTERVENTIONS: 96295- PT Re-evaluation, 97750- Physical Performance Testing, 97110-Therapeutic exercises, 97530- Therapeutic activity, 97112- Neuromuscular re-education, 97535- Self Care, 28413- Manual therapy, [phone]- Gait training, Patient/Family education, Balance training, and Stair training  PLAN FOR NEXT SESSION: Nustep, lower extremity strengthening, balance interventions, and gait training   Lane Pinon, PT 09/12/2023, 5:01 PM

## 2023-09-20 ENCOUNTER — Ambulatory Visit

## 2023-09-20 DIAGNOSIS — R2681 Unsteadiness on feet: Secondary | ICD-10-CM | POA: Diagnosis not present

## 2023-09-20 DIAGNOSIS — M6281 Muscle weakness (generalized): Secondary | ICD-10-CM

## 2023-09-20 NOTE — Therapy (Signed)
 OUTPATIENT PHYSICAL THERAPY LOWER EXTREMITY TREATMENT   Patient Name: Andrew [name] MRN: [id number] DOB:[birth year], 73 y.o., male Today's Date: 09/20/2023  END OF SESSION:  PT End of Session - 09/20/23 1102     Visit Number 2    Number of Visits 12    Date for PT Re-Evaluation 11/18/23    PT Start Time 1100    PT Stop Time 1151    PT Time Calculation (min) 51 min    Activity Tolerance Patient tolerated treatment well    Behavior During Therapy [facility] for tasks assessed/performed             Past Medical History:  Diagnosis Date   Acute viral pericarditis 09/03/2018   Inferior STE - but Negative Troponin.  CP &SOB.  Felt to be Viral.   Anemia    Arthritis    Bladder cancer (HCC) 2014   CAD (coronary artery disease)    Cataract    bilateral   Complication of anesthesia    Depression    Diabetes mellitus without complication (HCC)    Essential hypertension 01/20/2018   in the past no longer on medication   Family history of breast cancer    Family history of prostate cancer    Family history of testicular cancer    GERD (gastroesophageal reflux disease)    Heart attack (HCC) 03/2022   Hx of adenomatous colonic polyps 09/2017   colonoscopy   Hyperlipidemia with target LDL less than 70 12/19/2015   Intraoperative floppy iris syndrome (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 DES PCI RI, PTCA of RPDA 01/21/2018   12/2017 - Cath for ? STEMI -> distal/apical LAD & m-dCx CTO (unable to cross Cx).  Mod rPDA. Severe RI - DES PCI.  08/2018 - Cath for ? Inf STEMI - RI stent patent & CTO dLAD/mCx. Progression of rPDA 95% -> PTCA only.  Thought to be Pericarditis & not MI (troponin negative).    Neuromuscular disorder (HCC)    Polyposis of colon 01/25/2023   Sleep apnea    BiPAP not currently being used   Status post tendon repair 1989   STEMI (ST elevation myocardial infarction) (HCC) 01/20/2018   Cardiac cath January 21, 2018: Severe  multivessel disease with unclear lesion, but opted for PCI/DES x1 to the RI.  Appeared to have CTO of the distal LAD and circumflex as well as moderate mid LAD and diagonal disease as well as PDA.[name]  Unable to cross circumflex lesion.   STEMI (ST elevation myocardial infarction) (HCC) 03/2022   Stroke (HCC) 11/2015   At times pt has dizziness with loss of vision   Tremors of nervous system 2011   Past Surgical History:  Procedure Laterality Date   APPENDECTOMY     BLADDER SURGERY     COLONOSCOPY     CORONARY STENT INTERVENTION N/A 01/21/2018   Procedure: CORONARY STENT INTERVENTION;  Surgeon: Arleen Lacer, MD;  Location: [facility] INVASIVE CV LAB;  Service: Cardiovascular;  Laterality: N/A;  95% Ramus Intermedius -PCI with synergy DES 2.25 mm x 12 mm postdilated 2.4 mm.   CORONARY/GRAFT ACUTE MI REVASCULARIZATION N/A 01/21/2018   Procedure: Coronary/Graft Acute MI Revascularization;  Surgeon: Arleen Lacer, MD;  Location: [facility] INVASIVE CV LAB;  Service: Cardiovascular;  Laterality: N/A;  attempted revas to distal CFX;    CORONARY/GRAFT ACUTE MI REVASCULARIZATION N/A 09/04/2018   Procedure: Coronary/Graft Acute MI Revascularization;  Surgeon: Arnoldo Lapping, MD;  Location: [facility] INVASIVE CV LAB:: PTCA/POBA of rPDA (2.0 mm balloon)   CYSTOSCOPY N/A 01/21/2020   Procedure: CYSTOSCOPY;  Surgeon: Marco Severs, MD;  Location: [facility];  Service: Urology;  Laterality: N/A;   CYSTOSCOPY N/A 02/09/2021   Procedure: CYSTOSCOPY;  Surgeon: Marco Severs, MD;  Location: [facility];  Service: Urology;  Laterality: N/A;   CYSTOSCOPY Left 11/08/2022   Procedure: CYSTOSCOPY;  Surgeon: Marco Severs, MD;  Location: [facility];  Service: Urology;  Laterality: Left;   CYSTOSCOPY W/ RETROGRADES Bilateral 08/22/2015   Procedure: CYSTOSCOPY WITH RETROGRADE PYELOGRAM;  Surgeon: Homero Luster, MD;  Location: [facility];  Service: Urology;  Laterality: Bilateral;   CYSTOSCOPY W/ RETROGRADES Bilateral 01/16/2016   Procedure:  CYSTOSCOPY WITH RETROGRADE PYELOGRAM;  Surgeon: Homero Luster, MD;  Location: [facility];  Service: Urology;  Laterality: Bilateral;   CYSTOSCOPY W/ RETROGRADES Bilateral 01/07/2017   Procedure: CYSTOSCOPY WITH RETROGRADE PYELOGRAM;  Surgeon: Homero Luster, MD;  Location: [facility];  Service: Urology;  Laterality: Bilateral;   CYSTOSCOPY WITH BIOPSY N/A 08/22/2015   Procedure: CYSTOSCOPY WITH BLADDER BIOPSY;  Surgeon: Homero Luster, MD;  Location: [facility];  Service: Urology;  Laterality: N/A;   CYSTOSCOPY WITH BIOPSY N/A 01/16/2016   Procedure: CYSTOSCOPY WITH BLADDER BIOPSY;  Surgeon: Homero Luster, MD;  Location: [facility];  Service: Urology;  Laterality: N/A;   CYSTOSCOPY WITH FULGERATION N/A 01/16/2016   Procedure: CYSTOSCOPY WITH FULGERATION;  Surgeon: Homero Luster, MD;  Location: [facility];  Service: Urology;  Laterality: N/A;   CYSTOSCOPY/RETROGRADE/URETEROSCOPY Left 09/06/2022   Procedure: CYSTOSCOPY/RETROGRADE/URETEROSCOPY/STENT PLACEMENT;  Surgeon: Marco Severs, MD;  Location: [facility];  Service: Urology;  Laterality: Left;   KNEE ARTHROSCOPY Right 1989   LEFT HEART CATH AND CORONARY ANGIOGRAPHY N/A 01/21/2018   Procedure: LEFT HEART CATH AND CORONARY ANGIOGRAPHY;  Surgeon: Arleen Lacer, MD;  Location: [facility] INVASIVE CV LAB;  Service: Cardiovascular;; 95% RI-DES PCI.  80% o-pCX (PTCA) -> dCX 80%-100% CTO (unsuccessful PTCA unable to cross distal lesion with balloon.)  100% CTO dLAD. RPDA 80% and 70% as well as P AV 180%, PA V2 60%.  (too small for PCI)   LEFT HEART CATH AND CORONARY ANGIOGRAPHY N/A 09/04/2018   Procedure: LEFT HEART CATH AND CORONARY ANGIOGRAPHY;  Surgeon: Arnoldo Lapping, MD;  Location: [facility] INVASIVE CV LAB: (presumed Inferior STEMI) = progression of small rPDA -95% (PTCA only). Patent RI stent. CTO of apical LAD & mCx.   ROBOT ASSITED LAPAROSCOPIC NEPHROURETERECTOMY Left 11/08/2022   Procedure: XI ROBOT ASSITED LAPAROSCOPIC NEPHROURETERECTOMY;  Surgeon: Marco Severs, MD;  Location: [facility];   Service: Urology;  Laterality: Left;   ROTATOR CUFF REPAIR Bilateral 2000   rt. leg fracture surgery     TRANSTHORACIC ECHOCARDIOGRAM  01/21/2018   Mild LVH.  EF 50 and 55%.  Apical inferior hypokinesis.  Mid-apical anterolateral hypokinesis.  Normal diastolic function for age    TRANSTHORACIC ECHOCARDIOGRAM  09/04/2018   -? Inf STEMI vs. Pericarditis:  Mildly reduced EF of 45 to 50%.  Impaired relaxation-GR 1 DD.  Severe hypokinesis of the anterolateral and inferolateral wall.  Small-moderate anterior pericardial effusion.  Moderate aortic sclerosis but no stenosis.   TRANSURETHRAL RESECTION OF BLADDER TUMOR N/A 01/07/2017   Procedure: TRANSURETHRAL RESECTION OF BLADDER TUMOR (TURBT);  Surgeon: Homero Luster, MD;  Location: [facility];  Service: Urology;  Laterality: N/A;   TRANSURETHRAL RESECTION OF BLADDER TUMOR N/A 01/21/2020   Procedure: TRANSURETHRAL RESECTION OF BLADDER TUMOR (TURBT);  Surgeon: Marco Severs, MD;  Location: [facility];  Service: Urology;  Laterality: N/A;   TRANSURETHRAL RESECTION OF BLADDER TUMOR N/A 02/09/2021   Procedure: TRANSURETHRAL RESECTION OF BLADDER TUMOR (TURBT);  Surgeon: Marco Severs, MD;  Location: [facility];  Service: Urology;  Laterality: N/A;   URETERAL BIOPSY Left 09/06/2022   Procedure: URETERAL BIOPSY/fulgeration;  Surgeon: Marco Severs, MD;  Location: [facility];  Service: Urology;  Laterality: Left;   WISDOM TOOTH EXTRACTION     Patient Active Problem List   Diagnosis Date Noted   Epidermoid metaplasia esophagus 07/19/2023   Barrett's esophagus without dysplasia 07/19/2023   Abnormal endoscopy of upper gastrointestinal tract 07/19/2023   Genetic testing 02/10/2023   Polyposis of colon 01/25/2023   Family history of prostate cancer    Family history of testicular cancer    Family history of breast cancer    Ureteral cancer, left (HCC) 11/08/2022   Left renal mass 09/06/2022   History of pericarditis 04/26/2019   History of ST elevation  myocardial infarction (STEMI) 09/05/2018   Unstable angina (HCC) 09/04/2018   Presence of drug coated stent in ramus intermedius coronary artery 05/12/2018   COPD with acute exacerbation (HCC) 01/21/2018   Multivessel CAD - CTO dLAD, mCx. DES PCI RI, PTCA of RPDA 01/21/2018   Stable angina (HCC) 01/20/2018   Depression 01/20/2018   Essential hypertension 01/20/2018   Sleep apnea 10/06/2016   Tobacco use disorder 12/19/2015   Type 2 diabetes mellitus with circulatory disorder, without long-term current use of insulin  (HCC) 12/19/2015   Hyperlipidemia with target LDL less than 70 12/19/2015   Bladder cancer (HCC) 12/19/2015   Esophageal reflux 12/19/2015   Essential tremor 12/19/2015   History of stroke 12/19/2015    PCP: Authur Leghorn, MD   REFERRING PROVIDER: Authur Leghorn, MD   REFERRING DIAG: poor balance  THERAPY DIAG:  Unsteadiness on feet  Muscle weakness (generalized)  Rationale for Evaluation and Treatment: Rehabilitation  ONSET DATE: Winter 2024  SUBJECTIVE:   SUBJECTIVE STATEMENT: Pt reports every stiffness and soreness, but nothing out of the ordinary.R  PERTINENT HISTORY: Type 2 diabetes, angina, hypertension, COPD, history of cancer, history of a stroke, history of a myocardial infarction, tremors, arthritis, and depression PAIN:  Are you having pain? Yes: NPRS scale: no number given/10 Pain location: back, shoulders, and knees Pain description: aching, sore   PRECAUTIONS: Fall  RED FLAGS: None   WEIGHT BEARING RESTRICTIONS: No  FALLS:  Has patient fallen in last 6 months? Yes. Number of falls 2  LIVING ENVIRONMENT: Lives with: lives alone Lives in: House/apartment Stairs: Yes: Internal: 1 steps; none Has following equipment at home: Ramped entry  OCCUPATION: retired  PLOF: Independent with basic ADLs  PATIENT GOALS: improved safety, be able to use his chainsaw, improved strength, and be able to pick up things off the  floor  NEXT MD VISIT: unsure  OBJECTIVE:  Note: Objective measures were completed at Evaluation unless otherwise noted.  COGNITION: Overall cognitive status: Within functional limits for tasks assessed     SENSATION: Light touch: no significant deficits noted Patient reports numbness in both legs with the left being worse and more frequent than the right  LOWER EXTREMITY ROM: WFL for activities assessed  LOWER EXTREMITY MMT:  MMT Right eval Left eval  Hip flexion 4-/5; hip pain 3+/5  Hip extension    Hip abduction    Hip adduction    Hip internal rotation    Hip external rotation    Knee flexion 4/5  5/5  Knee extension 3+/5 5/5  Ankle dorsiflexion 3/5 3/5  Ankle plantarflexion    Ankle inversion    Ankle eversion     (Blank rows = not tested)  FUNCTIONAL TESTS:  5 times sit to stand: 1:05 with BUE support from armrests Timed up and go (TUG): 24.17 seconds 30 second sit to stand: 3 reps  GAIT: Assistive device utilized: None Level of assistance: Complete Independence Comments: Decreased gait speed with poor lateral stability and foot clearance bilaterally                                                                                                                                TREATMENT DATE:    09/20/23                                  EXERCISE LOG  Exercise Repetitions and Resistance Comments  Nustep Lvl 3 x 15 mins   Rockerboard 4 mins   Forward Step Ups 6" box x 20 reps bil   LAQs 3# x 20 reps bil   Seated Marches 3# x 20 reps bil   Seated Hip Abduction Red x 3 mins   Seated Hip Adduction    Seated Ham Curls Red x 20 reps bil   STS 10 reps     Blank cell = exercise not performed today   PATIENT EDUCATION:  Education details: Plan of care, prognosis, objective findings, and goals for physical therapy Person educated: Patient Education method: Explanation Education comprehension: verbalized understanding  HOME EXERCISE  PROGRAM:   ASSESSMENT:  CLINICAL IMPRESSION: Pt arrives for today's treatment session reporting typical BLE and low back stiffness and soreness, but nothing out of the ordinary.  Pt does not give a number for pain.  Pt instructed in several standing and seated BLE exercises with min cues required for proper technique.  Pt given seated rest breaks as needed due to fatigue.  Pt reported "feeling good" at completion of today's treatment session.  OBJECTIVE IMPAIRMENTS: Abnormal gait, decreased activity tolerance, decreased balance, decreased mobility, difficulty walking, decreased strength, and pain.   ACTIVITY LIMITATIONS: carrying, lifting, bending, standing, stairs, transfers, bathing, and locomotion level  PARTICIPATION LIMITATIONS: meal prep, cleaning, laundry, shopping, community activity, and yard work  PERSONAL FACTORS: Age, Past/current experiences, Time since onset of injury/illness/exacerbation, and 3+ comorbidities: Type 2 diabetes, angina, hypertension, COPD, history of cancer, history of a stroke, history of a myocardial infarction, tremors, arthritis, and depression  are also affecting patient's functional outcome.   REHAB POTENTIAL: Fair    CLINICAL DECISION MAKING: Evolving/moderate complexity  EVALUATION COMPLEXITY: Moderate   GOALS: Goals reviewed with patient? Yes  SHORT TERM GOALS: Target date: 10/03/23 Patient will be independent with his initial HEP. Baseline: Goal status: INITIAL  2.  Patient will improve his timed up and go time to 19 seconds or less for improved functional  mobility. Baseline:  Goal status: INITIAL  3.  Patient will improve his 5 times sit to stand time to 45 seconds or less for improved lower extremity power. Baseline:  Goal status: INITIAL  LONG TERM GOALS: Target date: 10/24/23  Patient will be independent with his advanced HEP. Baseline:  Goal status: INITIAL  2.  Patient will be able to safely pick up items from the floor for  improved household independence. Baseline:  Goal status: INITIAL  3.  Patient will improve his 5 times sit to stand time to 30 seconds or less for improved lower extremity power. Baseline:  Goal status: INITIAL  4.  Patient will improve his timed up and go time to 15 seconds or less to reduce his fall risk. Baseline:  Goal status: INITIAL  5.  Patient will improve his 30 second sit to stand test to at least 5 repetitions for improved lower extremity power needed for transfers. Baseline:  Goal status: INITIAL  PLAN:  PT FREQUENCY: 2x/week  PT DURATION: 6 weeks  PLANNED INTERVENTIONS: 97164- PT Re-evaluation, 97750- Physical Performance Testing, 97110-Therapeutic exercises, 97530- Therapeutic activity, 97112- Neuromuscular re-education, 97535- Self Care, 16109- Manual therapy, [phone]- Gait training, Patient/Family education, Balance training, and Stair training  PLAN FOR NEXT SESSION: Nustep, lower extremity strengthening, balance interventions, and gait training   Deryl Flora, PTA 09/20/2023, 12:07 PM

## 2023-09-23 ENCOUNTER — Ambulatory Visit: Attending: Family Medicine | Admitting: *Deleted

## 2023-09-23 DIAGNOSIS — M6281 Muscle weakness (generalized): Secondary | ICD-10-CM | POA: Diagnosis present

## 2023-09-23 DIAGNOSIS — R2681 Unsteadiness on feet: Secondary | ICD-10-CM | POA: Insufficient documentation

## 2023-09-23 NOTE — Therapy (Signed)
 OUTPATIENT PHYSICAL THERAPY LOWER EXTREMITY TREATMENT   Patient Name: Andrew [name] MRN: [id number] DOB:[birth year], 73 y.o., male Today's Date: 09/23/2023  END OF SESSION:  PT End of Session - 09/23/23 1113     Visit Number 3    Number of Visits 12    Date for PT Re-Evaluation 11/18/23    PT Start Time 1100    PT Stop Time 1150    PT Time Calculation (min) 50 min             Past Medical History:  Diagnosis Date   Acute viral pericarditis 09/03/2018   Inferior STE - but Negative Troponin.  CP &SOB.  Felt to be Viral.   Anemia    Arthritis    Bladder cancer (HCC) 2014   CAD (coronary artery disease)    Cataract    bilateral   Complication of anesthesia    Depression    Diabetes mellitus without complication (HCC)    Essential hypertension 01/20/2018   in the past no longer on medication   Family history of breast cancer    Family history of prostate cancer    Family history of testicular cancer    GERD (gastroesophageal reflux disease)    Heart attack (HCC) 03/2022   Hx of adenomatous colonic polyps 09/2017   colonoscopy   Hyperlipidemia with target LDL less than 70 12/19/2015   Intraoperative floppy iris syndrome (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 DES PCI RI, PTCA of RPDA 01/21/2018   12/2017 - Cath for ? STEMI -> distal/apical LAD & m-dCx CTO (unable to cross Cx).  Mod rPDA. Severe RI - DES PCI.  08/2018 - Cath for ? Inf STEMI - RI stent patent & CTO dLAD/mCx. Progression of rPDA 95% -> PTCA only.  Thought to be Pericarditis & not MI (troponin negative).    Neuromuscular disorder (HCC)    Polyposis of colon 01/25/2023   Sleep apnea    BiPAP not currently being used   Status post tendon repair 1989   STEMI (ST elevation myocardial infarction) (HCC) 01/20/2018   Cardiac cath January 21, 2018: Severe multivessel disease with unclear lesion, but opted for PCI/DES x1 to the RI.  Appeared to have CTO of the distal LAD and  circumflex as well as moderate mid LAD and diagonal disease as well as PDA.Andrew [name]  Unable to cross circumflex lesion.   STEMI (ST elevation myocardial infarction) (HCC) 03/2022   Stroke (HCC) 11/2015   At times pt has dizziness with loss of vision   Tremors of nervous system 2011   Past Surgical History:  Procedure Laterality Date   APPENDECTOMY     BLADDER SURGERY     COLONOSCOPY     CORONARY STENT INTERVENTION N/A 01/21/2018   Procedure: CORONARY STENT INTERVENTION;  Surgeon: Arleen Lacer, MD;  Location: [facility] INVASIVE CV LAB;  Service: Cardiovascular;  Laterality: N/A;  95% Ramus Intermedius -PCI with synergy DES 2.25 mm x 12 mm postdilated 2.4 mm.   CORONARY/GRAFT ACUTE MI REVASCULARIZATION N/A 01/21/2018   Procedure: Coronary/Graft Acute MI Revascularization;  Surgeon: Arleen Lacer, MD;  Location: [facility] INVASIVE CV LAB;  Service: Cardiovascular;  Laterality: N/A;  attempted revas to distal CFX;    CORONARY/GRAFT ACUTE MI REVASCULARIZATION N/A 09/04/2018   Procedure: Coronary/Graft Acute MI Revascularization;  Surgeon: Arnoldo Lapping, MD;  Location: [facility] INVASIVE CV LAB:: PTCA/POBA of rPDA (2.0 mm balloon)   CYSTOSCOPY N/A 01/21/2020   Procedure:  CYSTOSCOPY;  Surgeon: Marco Severs, MD;  Location: [facility];  Service: Urology;  Laterality: N/A;   CYSTOSCOPY N/A 02/09/2021   Procedure: CYSTOSCOPY;  Surgeon: Marco Severs, MD;  Location: [facility];  Service: Urology;  Laterality: N/A;   CYSTOSCOPY Left 11/08/2022   Procedure: CYSTOSCOPY;  Surgeon: Marco Severs, MD;  Location: [facility];  Service: Urology;  Laterality: Left;   CYSTOSCOPY W/ RETROGRADES Bilateral 08/22/2015   Procedure: CYSTOSCOPY WITH RETROGRADE PYELOGRAM;  Surgeon: Homero Luster, MD;  Location: [facility];  Service: Urology;  Laterality: Bilateral;   CYSTOSCOPY W/ RETROGRADES Bilateral 01/16/2016   Procedure: CYSTOSCOPY WITH RETROGRADE PYELOGRAM;  Surgeon: Homero Luster, MD;  Location: [facility];  Service: Urology;  Laterality:  Bilateral;   CYSTOSCOPY W/ RETROGRADES Bilateral 01/07/2017   Procedure: CYSTOSCOPY WITH RETROGRADE PYELOGRAM;  Surgeon: Homero Luster, MD;  Location: [facility];  Service: Urology;  Laterality: Bilateral;   CYSTOSCOPY WITH BIOPSY N/A 08/22/2015   Procedure: CYSTOSCOPY WITH BLADDER BIOPSY;  Surgeon: Homero Luster, MD;  Location: [facility];  Service: Urology;  Laterality: N/A;   CYSTOSCOPY WITH BIOPSY N/A 01/16/2016   Procedure: CYSTOSCOPY WITH BLADDER BIOPSY;  Surgeon: Homero Luster, MD;  Location: [facility];  Service: Urology;  Laterality: N/A;   CYSTOSCOPY WITH FULGERATION N/A 01/16/2016   Procedure: CYSTOSCOPY WITH FULGERATION;  Surgeon: Homero Luster, MD;  Location: [facility];  Service: Urology;  Laterality: N/A;   CYSTOSCOPY/RETROGRADE/URETEROSCOPY Left 09/06/2022   Procedure: CYSTOSCOPY/RETROGRADE/URETEROSCOPY/STENT PLACEMENT;  Surgeon: Marco Severs, MD;  Location: [facility];  Service: Urology;  Laterality: Left;   KNEE ARTHROSCOPY Right 1989   LEFT HEART CATH AND CORONARY ANGIOGRAPHY N/A 01/21/2018   Procedure: LEFT HEART CATH AND CORONARY ANGIOGRAPHY;  Surgeon: Arleen Lacer, MD;  Location: [facility] INVASIVE CV LAB;  Service: Cardiovascular;; 95% RI-DES PCI.  80% o-pCX (PTCA) -> dCX 80%-100% CTO (unsuccessful PTCA unable to cross distal lesion with balloon.)  100% CTO dLAD. RPDA 80% and 70% as well as P AV 180%, PA V2 60%.  (too small for PCI)   LEFT HEART CATH AND CORONARY ANGIOGRAPHY N/A 09/04/2018   Procedure: LEFT HEART CATH AND CORONARY ANGIOGRAPHY;  Surgeon: Arnoldo Lapping, MD;  Location: [facility] INVASIVE CV LAB: (presumed Inferior STEMI) = progression of small rPDA -95% (PTCA only). Patent RI stent. CTO of apical LAD & mCx.   ROBOT ASSITED LAPAROSCOPIC NEPHROURETERECTOMY Left 11/08/2022   Procedure: XI ROBOT ASSITED LAPAROSCOPIC NEPHROURETERECTOMY;  Surgeon: Marco Severs, MD;  Location: [facility];  Service: Urology;  Laterality: Left;   ROTATOR CUFF REPAIR Bilateral 2000   rt. leg fracture surgery      TRANSTHORACIC ECHOCARDIOGRAM  01/21/2018   Mild LVH.  EF 50 and 55%.  Apical inferior hypokinesis.  Mid-apical anterolateral hypokinesis.  Normal diastolic function for age    TRANSTHORACIC ECHOCARDIOGRAM  09/04/2018   -? Inf STEMI vs. Pericarditis:  Mildly reduced EF of 45 to 50%.  Impaired relaxation-GR 1 DD.  Severe hypokinesis of the anterolateral and inferolateral wall.  Small-moderate anterior pericardial effusion.  Moderate aortic sclerosis but no stenosis.   TRANSURETHRAL RESECTION OF BLADDER TUMOR N/A 01/07/2017   Procedure: TRANSURETHRAL RESECTION OF BLADDER TUMOR (TURBT);  Surgeon: Homero Luster, MD;  Location: [facility];  Service: Urology;  Laterality: N/A;   TRANSURETHRAL RESECTION OF BLADDER TUMOR N/A 01/21/2020   Procedure: TRANSURETHRAL RESECTION OF BLADDER TUMOR (TURBT);  Surgeon: Marco Severs, MD;  Location: [facility];  Service: Urology;  Laterality: N/A;   TRANSURETHRAL RESECTION OF BLADDER TUMOR N/A 02/09/2021  Procedure: TRANSURETHRAL RESECTION OF BLADDER TUMOR (TURBT);  Surgeon: Marco Severs, MD;  Location: [facility];  Service: Urology;  Laterality: N/A;   URETERAL BIOPSY Left 09/06/2022   Procedure: URETERAL BIOPSY/fulgeration;  Surgeon: Marco Severs, MD;  Location: [facility];  Service: Urology;  Laterality: Left;   WISDOM TOOTH EXTRACTION     Patient Active Problem List   Diagnosis Date Noted   Epidermoid metaplasia esophagus 07/19/2023   Barrett's esophagus without dysplasia 07/19/2023   Abnormal endoscopy of upper gastrointestinal tract 07/19/2023   Genetic testing 02/10/2023   Polyposis of colon 01/25/2023   Family history of prostate cancer    Family history of testicular cancer    Family history of breast cancer    Ureteral cancer, 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 DES PCI RI, PTCA of RPDA 01/21/2018   Stable angina (HCC) 01/20/2018   Depression 01/20/2018   Essential hypertension 01/20/2018   Sleep apnea 10/06/2016   Tobacco use disorder 12/19/2015   Type 2 diabetes mellitus with circulatory disorder, without long-term current use of insulin  (HCC) 12/19/2015   Hyperlipidemia with target LDL less than 70 12/19/2015   Bladder cancer (HCC) 12/19/2015   Esophageal reflux 12/19/2015   Essential tremor 12/19/2015   History of stroke 12/19/2015    PCP: Authur Leghorn, MD   REFERRING PROVIDER: Authur Leghorn, MD   REFERRING DIAG: poor balance  THERAPY DIAG:  Unsteadiness on feet  Muscle weakness (generalized)  Rationale for Evaluation and Treatment: Rehabilitation  ONSET DATE: Winter 2024  SUBJECTIVE:   SUBJECTIVE STATEMENT: Pt  did okay after last Rx.  PERTINENT HISTORY: Type 2 diabetes, angina, hypertension, COPD, history of cancer, history of a stroke, history of a myocardial infarction, tremors, arthritis, and depression PAIN:  Are you having pain? Yes: NPRS scale: no number given/10 Pain location: back, shoulders, and knees Pain description: aching, sore   PRECAUTIONS: Fall  RED FLAGS: None   WEIGHT BEARING RESTRICTIONS: No  FALLS:  Has patient fallen in last 6 months? Yes. Number of falls 2  LIVING ENVIRONMENT: Lives with: lives alone Lives in: House/apartment Stairs: Yes: Internal: 1 steps; none Has following equipment at home: Ramped entry  OCCUPATION: retired  PLOF: Independent with basic ADLs  PATIENT GOALS: improved safety, be able to use his chainsaw, improved strength, and be able to pick up things off the floor  NEXT MD VISIT: unsure  OBJECTIVE:  Note: Objective measures were completed at Evaluation unless otherwise noted.  COGNITION: Overall cognitive status: Within  functional limits for tasks assessed     SENSATION: Light touch: no significant deficits noted Patient reports numbness in both legs with the left being worse and more frequent than the right  LOWER EXTREMITY ROM: WFL for activities assessed  LOWER EXTREMITY MMT:  MMT Right eval Left eval  Hip flexion 4-/5; hip pain 3+/5  Hip extension    Hip abduction    Hip adduction    Hip internal rotation    Hip external rotation    Knee flexion 4/5 5/5  Knee extension 3+/5 5/5  Ankle dorsiflexion 3/5 3/5  Ankle plantarflexion    Ankle inversion    Ankle eversion     (  Blank rows = not tested)  FUNCTIONAL TESTS:  5 times sit to stand: 1:05 with BUE support from armrests Timed up and go (TUG): 24.17 seconds 30 second sit to stand: 3 reps  GAIT: Assistive device utilized: None Level of assistance: Complete Independence Comments: Decreased gait speed with poor lateral stability and foot clearance bilaterally                                                                                                                                TREATMENT DATE:    09/23/23                                  EXERCISE LOG  Exercise Repetitions and Resistance Comments  Nustep Lvl 3 x 16 mins   Rockerboard 6 mins   balance   Forward Step Ups    LAQs 3# 2 x 20 reps bil   Seated Marches    Seated Hip Abduction Red x 3 mins   Seated Hip Adduction    Seated Ham Curls Red x 20 reps bil   STS 10 reps  elevated table   Balance beam Side stepping    Blank cell = exercise not performed today   PATIENT EDUCATION:  Education details: Plan of care, prognosis, objective findings, and goals for physical therapy Person educated: Patient Education method: Explanation Education comprehension: verbalized understanding  HOME EXERCISE PROGRAM:   ASSESSMENT:  CLINICAL IMPRESSION:  Pt arrived today  doing fairly well with energy levels. Rx focused on standing exs, sitting exs as well proprioception/ balance  exs  And did well. Fatigued end of session.  OBJECTIVE IMPAIRMENTS: Abnormal gait, decreased activity tolerance, decreased balance, decreased mobility, difficulty walking, decreased strength, and pain.   ACTIVITY LIMITATIONS: carrying, lifting, bending, standing, stairs, transfers, bathing, and locomotion level  PARTICIPATION LIMITATIONS: meal prep, cleaning, laundry, shopping, community activity, and yard work  PERSONAL FACTORS: Age, Past/current experiences, Time since onset of injury/illness/exacerbation, and 3+ comorbidities: Type 2 diabetes, angina, hypertension, COPD, history of cancer, history of a stroke, history of a myocardial infarction, tremors, arthritis, and depression  are also affecting patient's functional outcome.   REHAB POTENTIAL: Fair    CLINICAL DECISION MAKING: Evolving/moderate complexity  EVALUATION COMPLEXITY: Moderate   GOALS: Goals reviewed with patient? Yes  SHORT TERM GOALS: Target date: 10/03/23 Patient will be independent with his initial HEP. Baseline: Goal status: INITIAL  2.  Patient will improve his timed up and go time to 19 seconds or less for improved functional mobility. Baseline:  Goal status: INITIAL  3.  Patient will improve his 5 times sit to stand time to 45 seconds or less for improved lower extremity power. Baseline:  Goal status: INITIAL  LONG TERM GOALS: Target date: 10/24/23  Patient will be independent with his advanced HEP. Baseline:  Goal status: INITIAL  2.  Patient will be able to  safely pick up items from the floor for improved household independence. Baseline:  Goal status: INITIAL  3.  Patient will improve his 5 times sit to stand time to 30 seconds or less for improved lower extremity power. Baseline:  Goal status: INITIAL  4.  Patient will improve his timed up and go time to 15 seconds or less to reduce his fall risk. Baseline:  Goal status: INITIAL  5.  Patient will improve his 30 second sit to stand test to  at least 5 repetitions for improved lower extremity power needed for transfers. Baseline:  Goal status: INITIAL  PLAN:  PT FREQUENCY: 2x/week  PT DURATION: 6 weeks  PLANNED INTERVENTIONS: 40981- PT Re-evaluation, 97750- Physical Performance Testing, 97110-Therapeutic exercises, 97530- Therapeutic activity, 97112- Neuromuscular re-education, 97535- Self Care, [id number]- Manual therapy, [phone]- Gait training, Patient/Family education, Balance training, and Stair training  PLAN FOR NEXT SESSION: Nustep, lower extremity strengthening, balance interventions, and gait training   Keyana Guevara,CHRIS, PTA 09/23/2023, 12:06 PM

## 2023-09-27 ENCOUNTER — Encounter

## 2023-09-30 ENCOUNTER — Ambulatory Visit

## 2023-09-30 DIAGNOSIS — R2681 Unsteadiness on feet: Secondary | ICD-10-CM | POA: Diagnosis not present

## 2023-09-30 DIAGNOSIS — M6281 Muscle weakness (generalized): Secondary | ICD-10-CM

## 2023-09-30 NOTE — Therapy (Signed)
 OUTPATIENT PHYSICAL THERAPY LOWER EXTREMITY TREATMENT   Patient Name: Andrew [name] MRN: [id number] DOB:Dec 05, 1950, 73 y.o., male Today's Date: 09/30/2023  END OF SESSION:  PT End of Session - 09/30/23 1106     Visit Number 4    Number of Visits 12    Date for PT Re-Evaluation 11/18/23    PT Start Time 1102    PT Stop Time 1146    PT Time Calculation (min) 44 min    Activity Tolerance Patient tolerated treatment well    Behavior During Therapy [facility] for tasks assessed/performed              Past Medical History:  Diagnosis Date   Acute viral pericarditis 09/03/2018   Inferior STE - but Negative Troponin.  CP &SOB.  Felt to be Viral.   Anemia    Arthritis    Bladder cancer (HCC) 2014   CAD (coronary artery disease)    Cataract    bilateral   Complication of anesthesia    Depression    Diabetes mellitus without complication (HCC)    Essential hypertension 01/20/2018   in the past no longer on medication   Family history of breast cancer    Family history of prostate cancer    Family history of testicular cancer    GERD (gastroesophageal reflux disease)    Heart attack (HCC) 03/2022   Hx of adenomatous colonic polyps 09/2017   colonoscopy   Hyperlipidemia with target LDL less than 70 12/19/2015   Intraoperative floppy iris syndrome (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 DES PCI RI, PTCA of RPDA 01/21/2018   12/2017 - Cath for ? STEMI -> distal/apical LAD & m-dCx CTO (unable to cross Cx).  Mod rPDA. Severe RI - DES PCI.  08/2018 - Cath for ? Inf STEMI - RI stent patent & CTO dLAD/mCx. Progression of rPDA 95% -> PTCA only.  Thought to be Pericarditis & not MI (troponin negative).    Neuromuscular disorder (HCC)    Polyposis of colon 01/25/2023   Sleep apnea    BiPAP not currently being used   Status post tendon repair 1989   STEMI (ST elevation myocardial infarction) (HCC) 01/20/2018   Cardiac cath January 21, 2018: Severe  multivessel disease with unclear lesion, but opted for PCI/DES x1 to the RI.  Appeared to have CTO of the distal LAD and circumflex as well as moderate mid LAD and diagonal disease as well as PDA.[name]  Unable to cross circumflex lesion.   STEMI (ST elevation myocardial infarction) (HCC) 03/2022   Stroke (HCC) 11/2015   At times pt has dizziness with loss of vision   Tremors of nervous system 2011   Past Surgical History:  Procedure Laterality Date   APPENDECTOMY     BLADDER SURGERY     COLONOSCOPY     CORONARY STENT INTERVENTION N/A 01/21/2018   Procedure: CORONARY STENT INTERVENTION;  Surgeon: Arleen Lacer, MD;  Location: [facility] INVASIVE CV LAB;  Service: Cardiovascular;  Laterality: N/A;  95% Ramus Intermedius -PCI with synergy DES 2.25 mm x 12 mm postdilated 2.4 mm.   CORONARY/GRAFT ACUTE MI REVASCULARIZATION N/A 01/21/2018   Procedure: Coronary/Graft Acute MI Revascularization;  Surgeon: Arleen Lacer, MD;  Location: [facility] INVASIVE CV LAB;  Service: Cardiovascular;  Laterality: N/A;  attempted revas to distal CFX;    CORONARY/GRAFT ACUTE MI REVASCULARIZATION N/A 09/04/2018   Procedure: Coronary/Graft Acute MI Revascularization;  Surgeon: Arnoldo Lapping, MD;  Location: [facility] INVASIVE CV LAB:: PTCA/POBA of rPDA (2.0 mm balloon)   CYSTOSCOPY N/A 01/21/2020   Procedure: CYSTOSCOPY;  Surgeon: Marco Severs, MD;  Location: [facility];  Service: Urology;  Laterality: N/A;   CYSTOSCOPY N/A 02/09/2021   Procedure: CYSTOSCOPY;  Surgeon: Marco Severs, MD;  Location: [facility];  Service: Urology;  Laterality: N/A;   CYSTOSCOPY Left 11/08/2022   Procedure: CYSTOSCOPY;  Surgeon: Marco Severs, MD;  Location: [facility];  Service: Urology;  Laterality: Left;   CYSTOSCOPY W/ RETROGRADES Bilateral 08/22/2015   Procedure: CYSTOSCOPY WITH RETROGRADE PYELOGRAM;  Surgeon: Homero Luster, MD;  Location: [facility];  Service: Urology;  Laterality: Bilateral;   CYSTOSCOPY W/ RETROGRADES Bilateral 01/16/2016   Procedure:  CYSTOSCOPY WITH RETROGRADE PYELOGRAM;  Surgeon: Homero Luster, MD;  Location: [facility];  Service: Urology;  Laterality: Bilateral;   CYSTOSCOPY W/ RETROGRADES Bilateral 01/07/2017   Procedure: CYSTOSCOPY WITH RETROGRADE PYELOGRAM;  Surgeon: Homero Luster, MD;  Location: [facility];  Service: Urology;  Laterality: Bilateral;   CYSTOSCOPY WITH BIOPSY N/A 08/22/2015   Procedure: CYSTOSCOPY WITH BLADDER BIOPSY;  Surgeon: Homero Luster, MD;  Location: [facility];  Service: Urology;  Laterality: N/A;   CYSTOSCOPY WITH BIOPSY N/A 01/16/2016   Procedure: CYSTOSCOPY WITH BLADDER BIOPSY;  Surgeon: Homero Luster, MD;  Location: [facility];  Service: Urology;  Laterality: N/A;   CYSTOSCOPY WITH FULGERATION N/A 01/16/2016   Procedure: CYSTOSCOPY WITH FULGERATION;  Surgeon: Homero Luster, MD;  Location: [facility];  Service: Urology;  Laterality: N/A;   CYSTOSCOPY/RETROGRADE/URETEROSCOPY Left 09/06/2022   Procedure: CYSTOSCOPY/RETROGRADE/URETEROSCOPY/STENT PLACEMENT;  Surgeon: Marco Severs, MD;  Location: [facility];  Service: Urology;  Laterality: Left;   KNEE ARTHROSCOPY Right 1989   LEFT HEART CATH AND CORONARY ANGIOGRAPHY N/A 01/21/2018   Procedure: LEFT HEART CATH AND CORONARY ANGIOGRAPHY;  Surgeon: Arleen Lacer, MD;  Location: [facility] INVASIVE CV LAB;  Service: Cardiovascular;; 95% RI-DES PCI.  80% o-pCX (PTCA) -> dCX 80%-100% CTO (unsuccessful PTCA unable to cross distal lesion with balloon.)  100% CTO dLAD. RPDA 80% and 70% as well as P AV 180%, PA V2 60%.  (too small for PCI)   LEFT HEART CATH AND CORONARY ANGIOGRAPHY N/A 09/04/2018   Procedure: LEFT HEART CATH AND CORONARY ANGIOGRAPHY;  Surgeon: Arnoldo Lapping, MD;  Location: [facility] INVASIVE CV LAB: (presumed Inferior STEMI) = progression of small rPDA -95% (PTCA only). Patent RI stent. CTO of apical LAD & mCx.   ROBOT ASSITED LAPAROSCOPIC NEPHROURETERECTOMY Left 11/08/2022   Procedure: XI ROBOT ASSITED LAPAROSCOPIC NEPHROURETERECTOMY;  Surgeon: Marco Severs, MD;  Location: [facility];   Service: Urology;  Laterality: Left;   ROTATOR CUFF REPAIR Bilateral 2000   rt. leg fracture surgery     TRANSTHORACIC ECHOCARDIOGRAM  01/21/2018   Mild LVH.  EF 50 and 55%.  Apical inferior hypokinesis.  Mid-apical anterolateral hypokinesis.  Normal diastolic function for age    TRANSTHORACIC ECHOCARDIOGRAM  09/04/2018   -? Inf STEMI vs. Pericarditis:  Mildly reduced EF of 45 to 50%.  Impaired relaxation-GR 1 DD.  Severe hypokinesis of the anterolateral and inferolateral wall.  Small-moderate anterior pericardial effusion.  Moderate aortic sclerosis but no stenosis.   TRANSURETHRAL RESECTION OF BLADDER TUMOR N/A 01/07/2017   Procedure: TRANSURETHRAL RESECTION OF BLADDER TUMOR (TURBT);  Surgeon: Homero Luster, MD;  Location: [facility];  Service: Urology;  Laterality: N/A;   TRANSURETHRAL RESECTION OF BLADDER TUMOR N/A 01/21/2020   Procedure: TRANSURETHRAL RESECTION OF BLADDER TUMOR (TURBT);  Surgeon: Marco Severs, MD;  Location: [facility];  Service: Urology;  Laterality: N/A;   TRANSURETHRAL RESECTION OF BLADDER TUMOR N/A 02/09/2021   Procedure: TRANSURETHRAL RESECTION OF BLADDER TUMOR (TURBT);  Surgeon: Marco Severs, MD;  Location: [facility];  Service: Urology;  Laterality: N/A;   URETERAL BIOPSY Left 09/06/2022   Procedure: URETERAL BIOPSY/fulgeration;  Surgeon: Marco Severs, MD;  Location: [facility];  Service: Urology;  Laterality: Left;   WISDOM TOOTH EXTRACTION     Patient Active Problem List   Diagnosis Date Noted   Epidermoid metaplasia esophagus 07/19/2023   Barrett's esophagus without dysplasia 07/19/2023   Abnormal endoscopy of upper gastrointestinal tract 07/19/2023   Genetic testing 02/10/2023   Polyposis of colon 01/25/2023   Family history of prostate cancer    Family history of testicular cancer    Family history of breast cancer    Ureteral cancer, left (HCC) 11/08/2022   Left renal mass 09/06/2022   History of pericarditis 04/26/2019   History of ST elevation  myocardial infarction (STEMI) 09/05/2018   Unstable angina (HCC) 09/04/2018   Presence of drug coated stent in ramus intermedius coronary artery 05/12/2018   COPD with acute exacerbation (HCC) 01/21/2018   Multivessel CAD - CTO dLAD, mCx. DES PCI RI, PTCA of RPDA 01/21/2018   Stable angina (HCC) 01/20/2018   Depression 01/20/2018   Essential hypertension 01/20/2018   Sleep apnea 10/06/2016   Tobacco use disorder 12/19/2015   Type 2 diabetes mellitus with circulatory disorder, without long-term current use of insulin  (HCC) 12/19/2015   Hyperlipidemia with target LDL less than 70 12/19/2015   Bladder cancer (HCC) 12/19/2015   Esophageal reflux 12/19/2015   Essential tremor 12/19/2015   History of stroke 12/19/2015    PCP: Authur Leghorn, MD   REFERRING PROVIDER: Authur Leghorn, MD   REFERRING DIAG: poor balance  THERAPY DIAG:  Unsteadiness on feet  Muscle weakness (generalized)  Rationale for Evaluation and Treatment: Rehabilitation  ONSET DATE: Winter 2024  SUBJECTIVE:   SUBJECTIVE STATEMENT: Patient reports that he feels better today than he did earlier this week.   PERTINENT HISTORY: Type 2 diabetes, angina, hypertension, COPD, history of cancer, history of a stroke, history of a myocardial infarction, tremors, arthritis, and depression PAIN:  Are you having pain? Yes: NPRS scale: no number given/10 Pain location: back, shoulders, and knees Pain description: aching, sore   PRECAUTIONS: Fall  RED FLAGS: None   WEIGHT BEARING RESTRICTIONS: No  FALLS:  Has patient fallen in last 6 months? Yes. Number of falls 2  LIVING ENVIRONMENT: Lives with: lives alone Lives in: House/apartment Stairs: Yes: Internal: 1 steps; none Has following equipment at home: Ramped entry  OCCUPATION: retired  PLOF: Independent with basic ADLs  PATIENT GOALS: improved safety, be able to use his chainsaw, improved strength, and be able to pick up things off the  floor  NEXT MD VISIT: unsure  OBJECTIVE:  Note: Objective measures were completed at Evaluation unless otherwise noted.  COGNITION: Overall cognitive status: Within functional limits for tasks assessed     SENSATION: Light touch: no significant deficits noted Patient reports numbness in both legs with the left being worse and more frequent than the right  LOWER EXTREMITY ROM: WFL for activities assessed  LOWER EXTREMITY MMT:  MMT Right eval Left eval  Hip flexion 4-/5; hip pain 3+/5  Hip extension    Hip abduction    Hip adduction    Hip internal rotation    Hip external rotation    Knee  flexion 4/5 5/5  Knee extension 3+/5 5/5  Ankle dorsiflexion 3/5 3/5  Ankle plantarflexion    Ankle inversion    Ankle eversion     (Blank rows = not tested)  FUNCTIONAL TESTS:  5 times sit to stand: 1:05 with BUE support from armrests Timed up and go (TUG): 24.17 seconds 30 second sit to stand: 3 reps  GAIT: Assistive device utilized: None Level of assistance: Complete Independence Comments: Decreased gait speed with poor lateral stability and foot clearance bilaterally                                                                                                                                TREATMENT DATE:                                     09/30/23 EXERCISE LOG  Exercise Repetitions and Resistance Comments  Nustep   L4 x 15 minutes   Pallof press  Red t-band x 2 x 8 reps each    Ball reach out  4# x 3 x 8 reps    Seated clams  Red t-band x 3 x 15 reps   Seated heel raise 3 minutes   Seated toe raise  3 x 8 reps    Seated hip ADD isometric  2 minutes w/ 3 second hold     Blank cell = exercise not performed today   09/23/23                                  EXERCISE LOG  Exercise Repetitions and Resistance Comments  Nustep Lvl 3 x 16 mins   Rockerboard 6 mins   balance   Forward Step Ups    LAQs 3# 2 x 20 reps bil   Seated Marches    Seated Hip Abduction Red x 3  mins   Seated Hip Adduction    Seated Ham Curls Red x 20 reps bil   STS 10 reps  elevated table   Balance beam Side stepping    Blank cell = exercise not performed today   PATIENT EDUCATION:  Education details: Plan of care, prognosis, objective findings, and goals for physical therapy Person educated: Patient Education method: Explanation Education comprehension: verbalized understanding  HOME EXERCISE PROGRAM:   ASSESSMENT:  CLINICAL IMPRESSION:  Patient was progressed with multiple new and familiar interventions with moderate difficulty. He required minimal cueing with today's new interventions for proper exercise performance. Muscular fatigue was his primary limitation with today's interventions as he required brief rest breaks throughout treatment. He reported feeling "ok" upon the conclusion of treatment. He continues to require skilled physical therapy to address his remaining impairments to maximize his safety and functional mobility.   OBJECTIVE IMPAIRMENTS: Abnormal gait, decreased activity tolerance, decreased balance, decreased mobility,  difficulty walking, decreased strength, and pain.   ACTIVITY LIMITATIONS: carrying, lifting, bending, standing, stairs, transfers, bathing, and locomotion level  PARTICIPATION LIMITATIONS: meal prep, cleaning, laundry, shopping, community activity, and yard work  PERSONAL FACTORS: Age, Past/current experiences, Time since onset of injury/illness/exacerbation, and 3+ comorbidities: Type 2 diabetes, angina, hypertension, COPD, history of cancer, history of a stroke, history of a myocardial infarction, tremors, arthritis, and depression are also affecting patient's functional outcome.   REHAB POTENTIAL: Fair    CLINICAL DECISION MAKING: Evolving/moderate complexity  EVALUATION COMPLEXITY: Moderate   GOALS: Goals reviewed with patient? Yes  SHORT TERM GOALS: Target date: 10/03/23 Patient will be independent with his initial  HEP. Baseline: Goal status: INITIAL  2.  Patient will improve his timed up and go time to 19 seconds or less for improved functional mobility. Baseline:  Goal status: INITIAL  3.  Patient will improve his 5 times sit to stand time to 45 seconds or less for improved lower extremity power. Baseline:  Goal status: INITIAL  LONG TERM GOALS: Target date: 10/24/23  Patient will be independent with his advanced HEP. Baseline:  Goal status: INITIAL  2.  Patient will be able to safely pick up items from the floor for improved household independence. Baseline:  Goal status: INITIAL  3.  Patient will improve his 5 times sit to stand time to 30 seconds or less for improved lower extremity power. Baseline:  Goal status: INITIAL  4.  Patient will improve his timed up and go time to 15 seconds or less to reduce his fall risk. Baseline:  Goal status: INITIAL  5.  Patient will improve his 30 second sit to stand test to at least 5 repetitions for improved lower extremity power needed for transfers. Baseline:  Goal status: INITIAL  PLAN:  PT FREQUENCY: 2x/week  PT DURATION: 6 weeks  PLANNED INTERVENTIONS: 16109- PT Re-evaluation, 97750- Physical Performance Testing, 97110-Therapeutic exercises, 97530- Therapeutic activity, 97112- Neuromuscular re-education, 97535- Self Care, [id number]- Manual therapy, [phone]- Gait training, Patient/Family education, Balance training, and Stair training  PLAN FOR NEXT SESSION: Nustep, lower extremity strengthening, balance interventions, and gait training   Lane Pinon, PT 09/30/2023, 12:10 PM

## 2023-10-03 ENCOUNTER — Encounter: Payer: Self-pay | Admitting: Urology

## 2023-10-03 ENCOUNTER — Ambulatory Visit: Admitting: Urology

## 2023-10-03 VITALS — BP 99/61 | HR 94

## 2023-10-03 DIAGNOSIS — C679 Malignant neoplasm of bladder, unspecified: Secondary | ICD-10-CM

## 2023-10-03 DIAGNOSIS — Z8554 Personal history of malignant neoplasm of ureter: Secondary | ICD-10-CM | POA: Diagnosis not present

## 2023-10-03 LAB — URINALYSIS, ROUTINE W REFLEX MICROSCOPIC
Bilirubin, UA: NEGATIVE
Ketones, UA: NEGATIVE
Leukocytes,UA: NEGATIVE
Nitrite, UA: NEGATIVE
Protein,UA: NEGATIVE
RBC, UA: NEGATIVE
Specific Gravity, UA: 1.01 (ref 1.005–1.030)
Urobilinogen, Ur: 0.2 mg/dL (ref 0.2–1.0)
pH, UA: 5.5 (ref 5.0–7.5)

## 2023-10-03 MED ORDER — CIPROFLOXACIN HCL 500 MG PO TABS
500.0000 mg | ORAL_TABLET | Freq: Once | ORAL | Status: AC
  Administered 2023-10-03: 500 mg via ORAL

## 2023-10-03 NOTE — Patient Instructions (Signed)

## 2023-10-03 NOTE — Progress Notes (Signed)
   10/03/23  CC: followup ureteral cancer   HPI: [name] is a 72yo here for followup for bladder and ureteral cancer Blood pressure 99/61, pulse 94. NED. A&Ox3.   No respiratory distress   Abd soft, NT, ND Normal phallus with bilateral descended testicles  Cystoscopy Procedure Note  Patient identification was confirmed, informed consent was obtained, and patient was prepped using Betadine solution.  Lidocaine  jelly was administered per urethral meatus.     Pre-Procedure: - Inspection reveals a normal caliber ureteral meatus.  Procedure: The flexible cystoscope was introduced without difficulty - No urethral strictures/lesions are present. - Enlarged prostate  - Normal bladder neck - Right ureteral orifices identified. Left surgically absent - Bladder mucosa  reveals no ulcers, tumors, or lesions - No bladder stones - No trabeculation     Post-Procedure: - Patient tolerated the procedure well  Assessment/ Plan: Followup 3 months for cystoscopy  No follow-ups on file.  Johnie Nailer, MD

## 2023-10-04 ENCOUNTER — Ambulatory Visit

## 2023-10-04 DIAGNOSIS — R2681 Unsteadiness on feet: Secondary | ICD-10-CM | POA: Diagnosis not present

## 2023-10-04 DIAGNOSIS — M6281 Muscle weakness (generalized): Secondary | ICD-10-CM

## 2023-10-04 NOTE — Therapy (Signed)
 OUTPATIENT PHYSICAL THERAPY LOWER EXTREMITY TREATMENT   Patient Name: Andrew [name] MRN: [id number] DOB:1950-09-14, 73 y.o., male Today's Date: 10/04/2023  END OF SESSION:  PT End of Session - 10/04/23 1118     Visit Number 5    Number of Visits 12    Date for PT Re-Evaluation 11/18/23    PT Start Time 1057    PT Stop Time 1147    PT Time Calculation (min) 50 min    Activity Tolerance Patient tolerated treatment well    Behavior During Therapy [facility] for tasks assessed/performed               Past Medical History:  Diagnosis Date   Acute viral pericarditis 09/03/2018   Inferior STE - but Negative Troponin.  CP &SOB.  Felt to be Viral.   Anemia    Arthritis    Bladder cancer (HCC) 2014   CAD (coronary artery disease)    Cataract    bilateral   Complication of anesthesia    Depression    Diabetes mellitus without complication (HCC)    Essential hypertension 01/20/2018   in the past no longer on medication   Family history of breast cancer    Family history of prostate cancer    Family history of testicular cancer    GERD (gastroesophageal reflux disease)    Heart attack (HCC) 03/2022   Hx of adenomatous colonic polyps 09/2017   colonoscopy   Hyperlipidemia with target LDL less than 70 12/19/2015   Intraoperative floppy iris syndrome (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 DES PCI RI, PTCA of RPDA 01/21/2018   12/2017 - Cath for ? STEMI -> distal/apical LAD & m-dCx CTO (unable to cross Cx).  Mod rPDA. Severe RI - DES PCI.  08/2018 - Cath for ? Inf STEMI - RI stent patent & CTO dLAD/mCx. Progression of rPDA 95% -> PTCA only.  Thought to be Pericarditis & not MI (troponin negative).    Neuromuscular disorder (HCC)    Polyposis of colon 01/25/2023   Sleep apnea    BiPAP not currently being used   Status post tendon repair 1989   STEMI (ST elevation myocardial infarction) (HCC) 01/20/2018   Cardiac cath January 21, 2018: Severe  multivessel disease with unclear lesion, but opted for PCI/DES x1 to the RI.  Appeared to have CTO of the distal LAD and circumflex as well as moderate mid LAD and diagonal disease as well as PDA.[name]  Unable to cross circumflex lesion.   STEMI (ST elevation myocardial infarction) (HCC) 03/2022   Stroke (HCC) 11/2015   At times pt has dizziness with loss of vision   Tremors of nervous system 2011   Past Surgical History:  Procedure Laterality Date   APPENDECTOMY     BLADDER SURGERY     COLONOSCOPY     CORONARY STENT INTERVENTION N/A 01/21/2018   Procedure: CORONARY STENT INTERVENTION;  Surgeon: Arleen Lacer, MD;  Location: [facility] INVASIVE CV LAB;  Service: Cardiovascular;  Laterality: N/A;  95% Ramus Intermedius -PCI with synergy DES 2.25 mm x 12 mm postdilated 2.4 mm.   CORONARY/GRAFT ACUTE MI REVASCULARIZATION N/A 01/21/2018   Procedure: Coronary/Graft Acute MI Revascularization;  Surgeon: Arleen Lacer, MD;  Location: [facility] INVASIVE CV LAB;  Service: Cardiovascular;  Laterality: N/A;  attempted revas to distal CFX;    CORONARY/GRAFT ACUTE MI REVASCULARIZATION N/A 09/04/2018   Procedure: Coronary/Graft Acute MI Revascularization;  Surgeon: Arnoldo Lapping,  MD;  Location: [facility] INVASIVE CV LAB:: PTCA/POBA of rPDA (2.0 mm balloon)   CYSTOSCOPY N/A 01/21/2020   Procedure: CYSTOSCOPY;  Surgeon: Marco Severs, MD;  Location: [facility];  Service: Urology;  Laterality: N/A;   CYSTOSCOPY N/A 02/09/2021   Procedure: CYSTOSCOPY;  Surgeon: Marco Severs, MD;  Location: [facility];  Service: Urology;  Laterality: N/A;   CYSTOSCOPY Left 11/08/2022   Procedure: CYSTOSCOPY;  Surgeon: Marco Severs, MD;  Location: [facility];  Service: Urology;  Laterality: Left;   CYSTOSCOPY W/ RETROGRADES Bilateral 08/22/2015   Procedure: CYSTOSCOPY WITH RETROGRADE PYELOGRAM;  Surgeon: Homero Luster, MD;  Location: [facility];  Service: Urology;  Laterality: Bilateral;   CYSTOSCOPY W/ RETROGRADES Bilateral 01/16/2016   Procedure:  CYSTOSCOPY WITH RETROGRADE PYELOGRAM;  Surgeon: Homero Luster, MD;  Location: [facility];  Service: Urology;  Laterality: Bilateral;   CYSTOSCOPY W/ RETROGRADES Bilateral 01/07/2017   Procedure: CYSTOSCOPY WITH RETROGRADE PYELOGRAM;  Surgeon: Homero Luster, MD;  Location: [facility];  Service: Urology;  Laterality: Bilateral;   CYSTOSCOPY WITH BIOPSY N/A 08/22/2015   Procedure: CYSTOSCOPY WITH BLADDER BIOPSY;  Surgeon: Homero Luster, MD;  Location: [facility];  Service: Urology;  Laterality: N/A;   CYSTOSCOPY WITH BIOPSY N/A 01/16/2016   Procedure: CYSTOSCOPY WITH BLADDER BIOPSY;  Surgeon: Homero Luster, MD;  Location: [facility];  Service: Urology;  Laterality: N/A;   CYSTOSCOPY WITH FULGERATION N/A 01/16/2016   Procedure: CYSTOSCOPY WITH FULGERATION;  Surgeon: Homero Luster, MD;  Location: [facility];  Service: Urology;  Laterality: N/A;   CYSTOSCOPY/RETROGRADE/URETEROSCOPY Left 09/06/2022   Procedure: CYSTOSCOPY/RETROGRADE/URETEROSCOPY/STENT PLACEMENT;  Surgeon: Marco Severs, MD;  Location: [facility];  Service: Urology;  Laterality: Left;   KNEE ARTHROSCOPY Right 1989   LEFT HEART CATH AND CORONARY ANGIOGRAPHY N/A 01/21/2018   Procedure: LEFT HEART CATH AND CORONARY ANGIOGRAPHY;  Surgeon: Arleen Lacer, MD;  Location: [facility] INVASIVE CV LAB;  Service: Cardiovascular;; 95% RI-DES PCI.  80% o-pCX (PTCA) -> dCX 80%-100% CTO (unsuccessful PTCA unable to cross distal lesion with balloon.)  100% CTO dLAD. RPDA 80% and 70% as well as P AV 180%, PA V2 60%.  (too small for PCI)   LEFT HEART CATH AND CORONARY ANGIOGRAPHY N/A 09/04/2018   Procedure: LEFT HEART CATH AND CORONARY ANGIOGRAPHY;  Surgeon: Arnoldo Lapping, MD;  Location: [facility] INVASIVE CV LAB: (presumed Inferior STEMI) = progression of small rPDA -95% (PTCA only). Patent RI stent. CTO of apical LAD & mCx.   ROBOT ASSITED LAPAROSCOPIC NEPHROURETERECTOMY Left 11/08/2022   Procedure: XI ROBOT ASSITED LAPAROSCOPIC NEPHROURETERECTOMY;  Surgeon: Marco Severs, MD;  Location: [facility];   Service: Urology;  Laterality: Left;   ROTATOR CUFF REPAIR Bilateral 2000   rt. leg fracture surgery     TRANSTHORACIC ECHOCARDIOGRAM  01/21/2018   Mild LVH.  EF 50 and 55%.  Apical inferior hypokinesis.  Mid-apical anterolateral hypokinesis.  Normal diastolic function for age    TRANSTHORACIC ECHOCARDIOGRAM  09/04/2018   -? Inf STEMI vs. Pericarditis:  Mildly reduced EF of 45 to 50%.  Impaired relaxation-GR 1 DD.  Severe hypokinesis of the anterolateral and inferolateral wall.  Small-moderate anterior pericardial effusion.  Moderate aortic sclerosis but no stenosis.   TRANSURETHRAL RESECTION OF BLADDER TUMOR N/A 01/07/2017   Procedure: TRANSURETHRAL RESECTION OF BLADDER TUMOR (TURBT);  Surgeon: Homero Luster, MD;  Location: [facility];  Service: Urology;  Laterality: N/A;   TRANSURETHRAL RESECTION OF BLADDER TUMOR N/A 01/21/2020   Procedure: TRANSURETHRAL RESECTION OF BLADDER TUMOR (TURBT);  Surgeon: Johnie Nailer  L, MD;  Location: [facility];  Service: Urology;  Laterality: N/A;   TRANSURETHRAL RESECTION OF BLADDER TUMOR N/A 02/09/2021   Procedure: TRANSURETHRAL RESECTION OF BLADDER TUMOR (TURBT);  Surgeon: Marco Severs, MD;  Location: [facility];  Service: Urology;  Laterality: N/A;   URETERAL BIOPSY Left 09/06/2022   Procedure: URETERAL BIOPSY/fulgeration;  Surgeon: Marco Severs, MD;  Location: [facility];  Service: Urology;  Laterality: Left;   WISDOM TOOTH EXTRACTION     Patient Active Problem List   Diagnosis Date Noted   Epidermoid metaplasia esophagus 07/19/2023   Barrett's esophagus without dysplasia 07/19/2023   Abnormal endoscopy of upper gastrointestinal tract 07/19/2023   Genetic testing 02/10/2023   Polyposis of colon 01/25/2023   Family history of prostate cancer    Family history of testicular cancer    Family history of breast cancer    Ureteral cancer, left (HCC) 11/08/2022   Left renal mass 09/06/2022   History of pericarditis 04/26/2019   History of ST elevation  myocardial infarction (STEMI) 09/05/2018   Unstable angina (HCC) 09/04/2018   Presence of drug coated stent in ramus intermedius coronary artery 05/12/2018   COPD with acute exacerbation (HCC) 01/21/2018   Multivessel CAD - CTO dLAD, mCx. DES PCI RI, PTCA of RPDA 01/21/2018   Stable angina (HCC) 01/20/2018   Depression 01/20/2018   Essential hypertension 01/20/2018   Sleep apnea 10/06/2016   Tobacco use disorder 12/19/2015   Type 2 diabetes mellitus with circulatory disorder, without long-term current use of insulin  (HCC) 12/19/2015   Hyperlipidemia with target LDL less than 70 12/19/2015   Bladder cancer (HCC) 12/19/2015   Esophageal reflux 12/19/2015   Essential tremor 12/19/2015   History of stroke 12/19/2015    PCP: Authur Leghorn, MD   REFERRING PROVIDER: Authur Leghorn, MD   REFERRING DIAG: poor balance  THERAPY DIAG:  Unsteadiness on feet  Muscle weakness (generalized)  Rationale for Evaluation and Treatment: Rehabilitation  ONSET DATE: Winter 2024  SUBJECTIVE:   SUBJECTIVE STATEMENT: Patient reports that he feels better today than he did earlier this week.   PERTINENT HISTORY: Type 2 diabetes, angina, hypertension, COPD, history of cancer, history of a stroke, history of a myocardial infarction, tremors, arthritis, and depression PAIN:  Are you having pain? Yes: NPRS scale: no number given/10 Pain location: back, shoulders, and knees Pain description: aching, sore   PRECAUTIONS: Fall  RED FLAGS: None   WEIGHT BEARING RESTRICTIONS: No  FALLS:  Has patient fallen in last 6 months? Yes. Number of falls 2  LIVING ENVIRONMENT: Lives with: lives alone Lives in: House/apartment Stairs: Yes: Internal: 1 steps; none Has following equipment at home: Ramped entry  OCCUPATION: retired  PLOF: Independent with basic ADLs  PATIENT GOALS: improved safety, be able to use his chainsaw, improved strength, and be able to pick up things off the  floor  NEXT MD VISIT: unsure  OBJECTIVE:  Note: Objective measures were completed at Evaluation unless otherwise noted.  COGNITION: Overall cognitive status: Within functional limits for tasks assessed     SENSATION: Light touch: no significant deficits noted Patient reports numbness in both legs with the left being worse and more frequent than the right  LOWER EXTREMITY ROM: WFL for activities assessed  LOWER EXTREMITY MMT:  MMT Right eval Left eval  Hip flexion 4-/5; hip pain 3+/5  Hip extension    Hip abduction    Hip adduction    Hip internal rotation    Hip external rotation  Knee flexion 4/5 5/5  Knee extension 3+/5 5/5  Ankle dorsiflexion 3/5 3/5  Ankle plantarflexion    Ankle inversion    Ankle eversion     (Blank rows = not tested)  FUNCTIONAL TESTS:  5 times sit to stand: 1:05 with BUE support from armrests Timed up and go (TUG): 24.17 seconds 30 second sit to stand: 3 reps  GAIT: Assistive device utilized: None Level of assistance: Complete Independence Comments: Decreased gait speed with poor lateral stability and foot clearance bilaterally                                                                                                                                TREATMENT DATE:                                     10/04/23 EXERCISE LOG  Exercise Repetitions and Resistance Comments  Nustep  L4 x 18 minutes   LAQ 4# x 2 x 10 reps each followed by 1 x 5 reps each    Seated marching  4# x 3 x 10 reps each  Alternating LE  Standing heel raise  3 minutes   Wall push up plus 20 reps    Standing hip ABD  2 minutes    Blank cell = exercise not performed today                                    09/30/23 EXERCISE LOG  Exercise Repetitions and Resistance Comments  Nustep   L4 x 15 minutes   Pallof press  Red t-band x 2 x 8 reps each    Ball reach out  4# x 3 x 8 reps    Seated clams  Red t-band x 3 x 15 reps   Seated heel raise 3 minutes   Seated  toe raise  3 x 8 reps    Seated hip ADD isometric  2 minutes w/ 3 second hold     Blank cell = exercise not performed today   09/23/23                                  EXERCISE LOG  Exercise Repetitions and Resistance Comments  Nustep Lvl 3 x 16 mins   Rockerboard 6 mins   balance   Forward Step Ups    LAQs 3# 2 x 20 reps bil   Seated Marches    Seated Hip Abduction Red x 3 mins   Seated Hip Adduction    Seated Ham Curls Red x 20 reps bil   STS 10 reps  elevated table   Balance beam Side stepping    Blank cell = exercise not performed today  PATIENT EDUCATION:  Education details: safety and exercising Person educated: Patient Education method: Explanation Education comprehension: verbalized understanding  HOME EXERCISE PROGRAM:   ASSESSMENT:  CLINICAL IMPRESSION:  Patient was progressed with multiple familiar interventions for improved lower extremity muscular strength and endurance with moderate difficulty and fatigue. He required minimal cueing with today's interventions for proper pacing to avoid a significant increase in fatigue. He required brief rest breaks throughout treatment due to fatigue. He reported feeling alright upon the conclusion of treatment. He continues to require skilled physical therapy to address his remaining impairments to maximize his functional mobility.   OBJECTIVE IMPAIRMENTS: Abnormal gait, decreased activity tolerance, decreased balance, decreased mobility, difficulty walking, decreased strength, and pain.   ACTIVITY LIMITATIONS: carrying, lifting, bending, standing, stairs, transfers, bathing, and locomotion level  PARTICIPATION LIMITATIONS: meal prep, cleaning, laundry, shopping, community activity, and yard work  PERSONAL FACTORS: Age, Past/current experiences, Time since onset of injury/illness/exacerbation, and 3+ comorbidities: Type 2 diabetes, angina, hypertension, COPD, history of cancer, history of a stroke, history of a myocardial  infarction, tremors, arthritis, and depression are also affecting patient's functional outcome.   REHAB POTENTIAL: Fair    CLINICAL DECISION MAKING: Evolving/moderate complexity  EVALUATION COMPLEXITY: Moderate   GOALS: Goals reviewed with patient? Yes  SHORT TERM GOALS: Target date: 10/03/23 Patient will be independent with his initial HEP. Baseline: Goal status: INITIAL  2.  Patient will improve his timed up and go time to 19 seconds or less for improved functional mobility. Baseline:  Goal status: INITIAL  3.  Patient will improve his 5 times sit to stand time to 45 seconds or less for improved lower extremity power. Baseline:  Goal status: INITIAL  LONG TERM GOALS: Target date: 10/24/23  Patient will be independent with his advanced HEP. Baseline:  Goal status: INITIAL  2.  Patient will be able to safely pick up items from the floor for improved household independence. Baseline:  Goal status: INITIAL  3.  Patient will improve his 5 times sit to stand time to 30 seconds or less for improved lower extremity power. Baseline:  Goal status: INITIAL  4.  Patient will improve his timed up and go time to 15 seconds or less to reduce his fall risk. Baseline:  Goal status: INITIAL  5.  Patient will improve his 30 second sit to stand test to at least 5 repetitions for improved lower extremity power needed for transfers. Baseline:  Goal status: INITIAL  PLAN:  PT FREQUENCY: 2x/week  PT DURATION: 6 weeks  PLANNED INTERVENTIONS: 16109- PT Re-evaluation, 97750- Physical Performance Testing, 97110-Therapeutic exercises, 97530- Therapeutic activity, 97112- Neuromuscular re-education, 97535- Self Care, [id number]- Manual therapy, [phone]- Gait training, Patient/Family education, Balance training, and Stair training  PLAN FOR NEXT SESSION: Nustep, lower extremity strengthening, balance interventions, and gait training   Lane Pinon, PT 10/04/2023, 12:55 PM

## 2023-10-07 ENCOUNTER — Ambulatory Visit

## 2023-10-07 DIAGNOSIS — R2681 Unsteadiness on feet: Secondary | ICD-10-CM | POA: Diagnosis not present

## 2023-10-07 DIAGNOSIS — M6281 Muscle weakness (generalized): Secondary | ICD-10-CM

## 2023-10-07 NOTE — Therapy (Signed)
 OUTPATIENT PHYSICAL THERAPY LOWER EXTREMITY TREATMENT   Patient Name: Andrew [name] MRN: [id number] DOB:Aug 29, 1950, 73 y.o., male Today's Date: 10/07/2023  END OF SESSION:  PT End of Session - 10/07/23 1104     Visit Number 6    Number of Visits 12    Date for PT Re-Evaluation 11/18/23    PT Start Time 1100    PT Stop Time 1146    PT Time Calculation (min) 46 min    Activity Tolerance Patient tolerated treatment well    Behavior During Therapy [facility] for tasks assessed/performed               Past Medical History:  Diagnosis Date   Acute viral pericarditis 09/03/2018   Inferior STE - but Negative Troponin.  CP &SOB.  Felt to be Viral.   Anemia    Arthritis    Bladder cancer (HCC) 2014   CAD (coronary artery disease)    Cataract    bilateral   Complication of anesthesia    Depression    Diabetes mellitus without complication (HCC)    Essential hypertension 01/20/2018   in the past no longer on medication   Family history of breast cancer    Family history of prostate cancer    Family history of testicular cancer    GERD (gastroesophageal reflux disease)    Heart attack (HCC) 03/2022   Hx of adenomatous colonic polyps 09/2017   colonoscopy   Hyperlipidemia with target LDL less than 70 12/19/2015   Intraoperative floppy iris syndrome (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 DES PCI RI, PTCA of RPDA 01/21/2018   12/2017 - Cath for ? STEMI -> distal/apical LAD & m-dCx CTO (unable to cross Cx).  Mod rPDA. Severe RI - DES PCI.  08/2018 - Cath for ? Inf STEMI - RI stent patent & CTO dLAD/mCx. Progression of rPDA 95% -> PTCA only.  Thought to be Pericarditis & not MI (troponin negative).    Neuromuscular disorder (HCC)    Polyposis of colon 01/25/2023   Sleep apnea    BiPAP not currently being used   Status post tendon repair 1989   STEMI (ST elevation myocardial infarction) (HCC) 01/20/2018   Cardiac cath January 21, 2018: Severe  multivessel disease with unclear lesion, but opted for PCI/DES x1 to the RI.  Appeared to have CTO of the distal LAD and circumflex as well as moderate mid LAD and diagonal disease as well as PDA.[name]  Unable to cross circumflex lesion.   STEMI (ST elevation myocardial infarction) (HCC) 03/2022   Stroke (HCC) 11/2015   At times pt has dizziness with loss of vision   Tremors of nervous system 2011   Past Surgical History:  Procedure Laterality Date   APPENDECTOMY     BLADDER SURGERY     COLONOSCOPY     CORONARY STENT INTERVENTION N/A 01/21/2018   Procedure: CORONARY STENT INTERVENTION;  Surgeon: Arleen Lacer, MD;  Location: [name] INVASIVE CV LAB;  Service: Cardiovascular;  Laterality: N/A;  95% Ramus Intermedius -PCI with synergy DES 2.25 mm x 12 mm postdilated 2.4 mm.   CORONARY/GRAFT ACUTE MI REVASCULARIZATION N/A 01/21/2018   Procedure: Coronary/Graft Acute MI Revascularization;  Surgeon: Arleen Lacer, MD;  Location: [facility] INVASIVE CV LAB;  Service: Cardiovascular;  Laterality: N/A;  attempted revas to distal CFX;    CORONARY/GRAFT ACUTE MI REVASCULARIZATION N/A 09/04/2018   Procedure: Coronary/Graft Acute MI Revascularization;  Surgeon: Arnoldo Lapping,  MD;  Location: [facility] INVASIVE CV LAB:: PTCA/POBA of rPDA (2.0 mm balloon)   CYSTOSCOPY N/A 01/21/2020   Procedure: CYSTOSCOPY;  Surgeon: Marco Severs, MD;  Location: [facility];  Service: Urology;  Laterality: N/A;   CYSTOSCOPY N/A 02/09/2021   Procedure: CYSTOSCOPY;  Surgeon: Marco Severs, MD;  Location: [facility];  Service: Urology;  Laterality: N/A;   CYSTOSCOPY Left 11/08/2022   Procedure: CYSTOSCOPY;  Surgeon: Marco Severs, MD;  Location: [facility];  Service: Urology;  Laterality: Left;   CYSTOSCOPY W/ RETROGRADES Bilateral 08/22/2015   Procedure: CYSTOSCOPY WITH RETROGRADE PYELOGRAM;  Surgeon: Homero Luster, MD;  Location: [facility];  Service: Urology;  Laterality: Bilateral;   CYSTOSCOPY W/ RETROGRADES Bilateral 01/16/2016   Procedure:  CYSTOSCOPY WITH RETROGRADE PYELOGRAM;  Surgeon: Homero Luster, MD;  Location: [facility];  Service: Urology;  Laterality: Bilateral;   CYSTOSCOPY W/ RETROGRADES Bilateral 01/07/2017   Procedure: CYSTOSCOPY WITH RETROGRADE PYELOGRAM;  Surgeon: Homero Luster, MD;  Location: [facility];  Service: Urology;  Laterality: Bilateral;   CYSTOSCOPY WITH BIOPSY N/A 08/22/2015   Procedure: CYSTOSCOPY WITH BLADDER BIOPSY;  Surgeon: Homero Luster, MD;  Location: [facility];  Service: Urology;  Laterality: N/A;   CYSTOSCOPY WITH BIOPSY N/A 01/16/2016   Procedure: CYSTOSCOPY WITH BLADDER BIOPSY;  Surgeon: Homero Luster, MD;  Location: [facility];  Service: Urology;  Laterality: N/A;   CYSTOSCOPY WITH FULGERATION N/A 01/16/2016   Procedure: CYSTOSCOPY WITH FULGERATION;  Surgeon: Homero Luster, MD;  Location: [facility];  Service: Urology;  Laterality: N/A;   CYSTOSCOPY/RETROGRADE/URETEROSCOPY Left 09/06/2022   Procedure: CYSTOSCOPY/RETROGRADE/URETEROSCOPY/STENT PLACEMENT;  Surgeon: Marco Severs, MD;  Location: [facility];  Service: Urology;  Laterality: Left;   KNEE ARTHROSCOPY Right 1989   LEFT HEART CATH AND CORONARY ANGIOGRAPHY N/A 01/21/2018   Procedure: LEFT HEART CATH AND CORONARY ANGIOGRAPHY;  Surgeon: Arleen Lacer, MD;  Location[facility] INVAS[facility] CV LAB;  Service: Cardiovascular;; 95% RI-DES PCI.  80% o-pCX (PTCA) -> dCX 80%-100% CTO (unsuccessful PTCA unable to cross distal lesion with balloon.)  100% CTO dLAD. RPDA 80% and 70% as well as P AV 180%, PA V2 60%.  (too small for PCI)   LEFT HEART CATH AND CORONARY ANGIOGRAPHY N/A 09/04/2018   Procedure: LEFT HEART CATH AND CORONARY ANGIOGRAPHY;  Surgeon: Arnoldo Lapping, MD;  Location: [facility] INVASIVE CV LAB: (presumed Inferior STEMI) = progression of small rPDA -95% (PTCA only). Patent RI stent. CTO of apical LAD & mCx.   ROBOT ASSITED LAPAROSCOPIC NEPHROURETERECTOMY Left 11/08/2022   Procedure: XI ROBOT ASSITED LAPAROSCOPIC NEPHROURETERECTOMY;  Surgeon: Marco Severs, MD;  Location: [facility];   Service: Urology;  Laterality: Left;   ROTATOR CUFF REPAIR Bilateral 2000   rt. leg fracture surgery     TRANSTHORACIC ECHOCARDIOGRAM  01/21/2018   Mild LVH.  EF 50 and 55%.  Apical inferior hypokinesis.  Mid-apical anterolateral hypokinesis.  Normal diastolic function for age    TRANSTHORACIC ECHOCARDIOGRAM  09/04/2018   -? Inf STEMI vs. Pericarditis:  Mildly reduced EF of 45 to 50%.  Impaired relaxation-GR 1 DD.  Severe hypokinesis of the anterolateral and inferolateral wall.  Small-moderate anterior pericardial effusion.  Moderate aortic sclerosis but no stenosis.   TRANSURETHRAL RESECTION OF BLADDER TUMOR N/A 01/07/2017   Procedure: TRANSURETHRAL RESECTION OF BLADDER TUMOR (TURBT);  Surgeon: Homero Luster, MD;  Location: [facility];  Service: Urology;  Laterality: N/A;   TRANSURETHRAL RESECTION OF BLADDER TUMOR N/A 01/21/2020   Procedure: TRANSURETHRAL RESECTION OF BLADDER TUMOR (TURBT);  Surgeon: Johnie Nailer  L, MD;  Location: [facility];  Service: Urology;  Laterality: N/A;   TRANSURETHRAL RESECTION OF BLADDER TUMOR N/A 02/09/2021   Procedure: TRANSURETHRAL RESECTION OF BLADDER TUMOR (TURBT);  Surgeon: Marco Severs, MD;  Location: [facility];  Service: Urology;  Laterality: N/A;   URETERAL BIOPSY Left 09/06/2022   Procedure: URETERAL BIOPSY/fulgeration;  Surgeon: Marco Severs, MD;  Location: [facility];  Service: Urology;  Laterality: Left;   WISDOM TOOTH EXTRACTION     Patient Active Problem List   Diagnosis Date Noted   Epidermoid metaplasia esophagus 07/19/2023   Barrett's esophagus without dysplasia 07/19/2023   Abnormal endoscopy of upper gastrointestinal tract 07/19/2023   Genetic testing 02/10/2023   Polyposis of colon 01/25/2023   Family history of prostate cancer    Family history of testicular cancer    Family history of breast cancer    Ureteral cancer, left (HCC) 11/08/2022   Left renal mass 09/06/2022   History of pericarditis 04/26/2019   History of ST elevation  myocardial infarction (STEMI) 09/05/2018   Unstable angina (HCC) 09/04/2018   Presence of drug coated stent in ramus intermedius coronary artery 05/12/2018   COPD with acute exacerbation (HCC) 01/21/2018   Multivessel CAD - CTO dLAD, mCx. DES PCI RI, PTCA of RPDA 01/21/2018   Stable angina (HCC) 01/20/2018   Depression 01/20/2018   Essential hypertension 01/20/2018   Sleep apnea 10/06/2016   Tobacco use disorder 12/19/2015   Type 2 diabetes mellitus with circulatory disorder, without long-term current use of insulin  (HCC) 12/19/2015   Hyperlipidemia with target LDL less than 70 12/19/2015   Bladder cancer (HCC) 12/19/2015   Esophageal reflux 12/19/2015   Essential tremor 12/19/2015   History of stroke 12/19/2015    PCP: Authur Leghorn, MD   REFERRING PROVIDER: Authur Leghorn, MD   REFERRING DIAG: poor balance  THERAPY DIAG:  Unsteadiness on feet  Muscle weakness (generalized)  Rationale for Evaluation and Treatment: Rehabilitation  ONSET DATE: Winter 2024  SUBJECTIVE:   SUBJECTIVE STATEMENT: Patient reports that he feels better today than he did earlier this week.   PERTINENT HISTORY: Type 2 diabetes, angina, hypertension, COPD, history of cancer, history of a stroke, history of a myocardial infarction, tremors, arthritis, and depression PAIN:  Are you having pain? Yes: NPRS scale: no number given/10 Pain location: back, shoulders, and knees Pain description: aching, sore   PRECAUTIONS: Fall  RED FLAGS: None   WEIGHT BEARING RESTRICTIONS: No  FALLS:  Has patient fallen in last 6 months? Yes. Number of falls 2  LIVING ENVIRONMENT: Lives with: lives alone Lives in: House/apartment Stairs: Yes: Internal: 1 steps; none Has following equipment at home: Ramped entry  OCCUPATION: retired  PLOF: Independent with basic ADLs  PATIENT GOALS: improved safety, be able to use his chainsaw, improved strength, and be able to pick up things off the  floor  NEXT MD VISIT: unsure  OBJECTIVE:  Note: Objective measures were completed at Evaluation unless otherwise noted.  COGNITION: Overall cognitive status: Within functional limits for tasks assessed     SENSATION: Light touch: no significant deficits noted Patient reports numbness in both legs with the left being worse and more frequent than the right  LOWER EXTREMITY ROM: WFL for activities assessed  LOWER EXTREMITY MMT:  MMT Right eval Left eval  Hip flexion 4-/5; hip pain 3+/5  Hip extension    Hip abduction    Hip adduction    Hip internal rotation    Hip external rotation  Knee flexion 4/5 5/5  Knee extension 3+/5 5/5  Ankle dorsiflexion 3/5 3/5  Ankle plantarflexion    Ankle inversion    Ankle eversion     (Blank rows = not tested)  FUNCTIONAL TESTS:  5 times sit to stand: 1:05 with BUE support from armrests Timed up and go (TUG): 24.17 seconds 30 second sit to stand: 3 reps  GAIT: Assistive device utilized: None Level of assistance: Complete Independence Comments: Decreased gait speed with poor lateral stability and foot clearance bilaterally                                                                                                                                TREATMENT DATE:    10/07/23     EXERCISE LOG  Exercise Repetitions and Resistance Comments  Nustep  L4 x 16 minutes   LAQs 5# x 20 reps bil   Seated marching  5# x 20 reps bil   Standing heel raise  15 reps no UE support   TUG 18 seconds   5 STS 42 seconds   Side-stepping Airex x 3 mins    Blank cell = exercise not performed today                                    09/30/23 EXERCISE LOG  Exercise Repetitions and Resistance Comments  Nustep   L4 x 15 minutes   Pallof press  Red t-band x 2 x 8 reps each    Ball reach out  4# x 3 x 8 reps    Seated clams  Red t-band x 3 x 15 reps   Seated heel raise 3 minutes   Seated toe raise  3 x 8 reps    Seated hip ADD isometric  2 minutes  w/ 3 second hold     Blank cell = exercise not performed today   09/23/23                                  EXERCISE LOG  Exercise Repetitions and Resistance Comments  Nustep Lvl 3 x 16 mins   Rockerboard 6 mins   balance   Forward Step Ups    LAQs 3# 2 x 20 reps bil   Seated Marches    Seated Hip Abduction Red x 3 mins   Seated Hip Adduction    Seated Ham Curls Red x 20 reps bil   STS 10 reps  elevated table   Balance beam Side stepping    Blank cell = exercise not performed today   PATIENT EDUCATION:  Education details: safety and exercising Person educated: Patient Education method: Explanation Education comprehension: verbalized understanding  HOME EXERCISE PROGRAM:   ASSESSMENT:  CLINICAL IMPRESSION:  Pt arrives for today's treatment session denying any pain, but  reports just getting out of bed and being tired today.   Pt able to perform 5 STS in 42.6 seconds and TUG in 18 seconds, meeting all of his STGs today.  Pt challenged with balance exercises today requiring CGA/sup for safety and stability.  Pt denied any pain at completion of today's treatment session.   OBJECTIVE IMPAIRMENTS: Abnormal gait, decreased activity tolerance, decreased balance, decreased mobility, difficulty walking, decreased strength, and pain.   ACTIVITY LIMITATIONS: carrying, lifting, bending, standing, stairs, transfers, bathing, and locomotion level  PARTICIPATION LIMITATIONS: meal prep, cleaning, laundry, shopping, community activity, and yard work  PERSONAL FACTORS: Age, Past/current experiences, Time since onset of injury/illness/exacerbation, and 3+ comorbidities: Type 2 diabetes, angina, hypertension, COPD, history of cancer, history of a stroke, history of a myocardial infarction, tremors, arthritis, and depression are also affecting patient's functional outcome.   REHAB POTENTIAL: Fair    CLINICAL DECISION MAKING: Evolving/moderate complexity  EVALUATION COMPLEXITY:  Moderate   GOALS: Goals reviewed with patient? Yes  SHORT TERM GOALS: Target date: 10/03/23 Patient will be independent with his initial HEP. Baseline: Goal status: MET  2.  Patient will improve his timed up and go time to 19 seconds or less for improved functional mobility. Baseline: 5/16: 18.6 seconds Goal status: MET  3.  Patient will improve his 5 times sit to stand time to 45 seconds or less for improved lower extremity power. Baseline: 5/16: 42.6 seconds Goal status: MET  LONG TERM GOALS: Target date: 10/24/23  Patient will be independent with his advanced HEP. Baseline:  Goal status: IN PROGRESS  2.  Patient will be able to safely pick up items from the floor for improved household independence. Baseline:  Goal status: IN PROGRESS  3.  Patient will improve his 5 times sit to stand time to 30 seconds or less for improved lower extremity power. Baseline:  Goal status: IN PROGRESS  4.  Patient will improve his timed up and go time to 15 seconds or less to reduce his fall risk. Baseline:  Goal status: IN PROGRESS  5.  Patient will improve his 30 second sit to stand test to at least 5 repetitions for improved lower extremity power needed for transfers. Baseline:  Goal status: IN PROGRESS  PLAN:  PT FREQUENCY: 2x/week  PT DURATION: 6 weeks  PLANNED INTERVENTIONS: 97164- PT Re-evaluation, 97750- Physical Performance Testing, 97110-Therapeutic exercises, 97530- Therapeutic activity, 97112- Neuromuscular re-education, 97535- Self Care, [id number]- Manual therapy, [phone]- Gait training, Patient/Family education, Balance training, and Stair training  PLAN FOR NEXT SESSION: Nustep, lower extremity strengthening, balance interventions, and gait training   Deryl Flora, PTA 10/07/2023, 11:53 AM

## 2023-10-11 ENCOUNTER — Ambulatory Visit

## 2023-10-11 DIAGNOSIS — M6281 Muscle weakness (generalized): Secondary | ICD-10-CM

## 2023-10-11 DIAGNOSIS — R2681 Unsteadiness on feet: Secondary | ICD-10-CM

## 2023-10-11 NOTE — Therapy (Signed)
 OUTPATIENT PHYSICAL THERAPY LOWER EXTREMITY TREATMENT   Patient Name: Andrew [name] MRN: [id number] DOB:05-17-1951, 73 y.o., male Today's Date: 10/11/2023  END OF SESSION:  PT End of Session - 10/11/23 1307     Visit Number 7    Number of Visits 12    Date for PT Re-Evaluation 11/18/23    PT Start Time 1303    PT Stop Time 1350    PT Time Calculation (min) 47 min    Activity Tolerance Patient tolerated treatment well    Behavior During Therapy [facility] for tasks assessed/performed               Past Medical History:  Diagnosis Date   Acute viral pericarditis 09/03/2018   Inferior STE - but Negative Troponin.  CP &SOB.  Felt to be Viral.   Anemia    Arthritis    Bladder cancer (HCC) 2014   CAD (coronary artery disease)    Cataract    bilateral   Complication of anesthesia    Depression    Diabetes mellitus without complication (HCC)    Essential hypertension 01/20/2018   in the past no longer on medication   Family history of breast cancer    Family history of prostate cancer    Family history of testicular cancer    GERD (gastroesophageal reflux disease)    Heart attack (HCC) 03/2022   Hx of adenomatous colonic polyps 09/2017   colonoscopy   Hyperlipidemia with target LDL less than 70 12/19/2015   Intraoperative floppy iris syndrome (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 DES PCI RI, PTCA of RPDA 01/21/2018   12/2017 - Cath for ? STEMI -> distal/apical LAD & m-dCx CTO (unable to cross Cx).  Mod rPDA. Severe RI - DES PCI.  08/2018 - Cath for ? Inf STEMI - RI stent patent & CTO dLAD/mCx. Progression of rPDA 95% -> PTCA only.  Thought to be Pericarditis & not MI (troponin negative).    Neuromuscular disorder (HCC)    Polyposis of colon 01/25/2023   Sleep apnea    BiPAP not currently being used   Status post tendon repair 1989   STEMI (ST elevation myocardial infarction) (HCC) 01/20/2018   Cardiac cath January 21, 2018: Severe  multivessel disease with unclear lesion, but opted for PCI/DES x1 to the RI.  Appeared to have CTO of the distal LAD and circumflex as well as moderate mid LAD and diagonal disease as well as PDA.[name]  Unable to cross circumflex lesion.   STEMI (ST elevation myocardial infarction) (HCC) 03/2022   Stroke (HCC) 11/2015   At times pt has dizziness with loss of vision   Tremors of nervous system 2011   Past Surgical History:  Procedure Laterality Date   APPENDECTOMY     BLADDER SURGERY     COLONOSCOPY     CORONARY STENT INTERVENTION N/A 01/21/2018   Procedure: CORONARY STENT INTERVENTION;  Surgeon: Arleen Lacer, MD;  Location: [facility] INVASIVE CV LAB;  Service: Cardiovascular;  Laterality: N/A;  95% Ramus Intermedius -PCI with synergy DES 2.25 mm x 12 mm postdilated 2.4 mm.   CORONARY/GRAFT ACUTE MI REVASCULARIZATION N/A 01/21/2018   Procedure: Coronary/Graft Acute MI Revascularization;  Surgeon: Arleen Lacer, MD;  Location: [facility] INVASIVE CV LAB;  Service: Cardiovascular;  Laterality: N/A;  attempted revas to distal CFX;    CORONARY/GRAFT ACUTE MI REVASCULARIZATION N/A 09/04/2018   Procedure: Coronary/Graft Acute MI Revascularization;  Surgeon: Arnoldo Lapping,  MD;  Location: [facility] INVASIVE CV LAB:: PTCA/POBA of rPDA (2.0 mm balloon)   CYSTOSCOPY N/A 01/21/2020   Procedure: CYSTOSCOPY;  Surgeon: Marco Severs, MD;  Location: [facility];  Service: Urology;  Laterality: N/A;   CYSTOSCOPY N/A 02/09/2021   Procedure: CYSTOSCOPY;  Surgeon: Marco Severs, MD;  Location: [facility];  Service: Urology;  Laterality: N/A;   CYSTOSCOPY Left 11/08/2022   Procedure: CYSTOSCOPY;  Surgeon: Marco Severs, MD;  Location: [facility];  Service: Urology;  Laterality: Left;   CYSTOSCOPY W/ RETROGRADES Bilateral 08/22/2015   Procedure: CYSTOSCOPY WITH RETROGRADE PYELOGRAM;  Surgeon: Homero Luster, MD;  Location: [facility];  Service: Urology;  Laterality: Bilateral;   CYSTOSCOPY W/ RETROGRADES Bilateral 01/16/2016   Procedure:  CYSTOSCOPY WITH RETROGRADE PYELOGRAM;  Surgeon: Homero Luster, MD;  Location: [facility];  Service: Urology;  Laterality: Bilateral;   CYSTOSCOPY W/ RETROGRADES Bilateral 01/07/2017   Procedure: CYSTOSCOPY WITH RETROGRADE PYELOGRAM;  Surgeon: Homero Luster, MD;  Location: [facility];  Service: Urology;  Laterality: Bilateral;   CYSTOSCOPY WITH BIOPSY N/A 08/22/2015   Procedure: CYSTOSCOPY WITH BLADDER BIOPSY;  Surgeon: Homero Luster, MD;  Location: [facility];  Service: Urology;  Laterality: N/A;   CYSTOSCOPY WITH BIOPSY N/A 01/16/2016   Procedure: CYSTOSCOPY WITH BLADDER BIOPSY;  Surgeon: Homero Luster, MD;  Location: [facility];  Service: Urology;  Laterality: N/A;   CYSTOSCOPY WITH FULGERATION N/A 01/16/2016   Procedure: CYSTOSCOPY WITH FULGERATION;  Surgeon: Homero Luster, MD;  Location: [facility];  Service: Urology;  Laterality: N/A;   CYSTOSCOPY/RETROGRADE/URETEROSCOPY Left 09/06/2022   Procedure: CYSTOSCOPY/RETROGRADE/URETEROSCOPY/STENT PLACEMENT;  Surgeon: Marco Severs, MD;  Location: [facility];  Service: Urology;  Laterality: Left;   KNEE ARTHROSCOPY Right 1989   LEFT HEART CATH AND CORONARY ANGIOGRAPHY N/A 01/21/2018   Procedure: LEFT HEART CATH AND CORONARY ANGIOGRAPHY;  Surgeon: Arleen Lacer, MD;  Location: [facility] INVASIVE CV LAB;  Service: Cardiovascular;; 95% RI-DES PCI.  80% o-pCX (PTCA) -> dCX 80%-100% CTO (unsuccessful PTCA unable to cross distal lesion with balloon.)  100% CTO dLAD. RPDA 80% and 70% as well as P AV 180%, PA V2 60%.  (too small for PCI)   LEFT HEART CATH AND CORONARY ANGIOGRAPHY N/A 09/04/2018   Procedure: LEFT HEART CATH AND CORONARY ANGIOGRAPHY;  Surgeon: Arnoldo Lapping, MD;  Location: [facility] INVASIVE CV LAB: (presumed Inferior STEMI) = progression of small rPDA -95% (PTCA only). Patent RI stent. CTO of apical LAD & mCx.   ROBOT ASSITED LAPAROSCOPIC NEPHROURETERECTOMY Left 11/08/2022   Procedure: XI ROBOT ASSITED LAPAROSCOPIC NEPHROURETERECTOMY;  Surgeon: Marco Severs, MD;  Location: [facility];   Service: Urology;  Laterality: Left;   ROTATOR CUFF REPAIR Bilateral 2000   rt. leg fracture surgery     TRANSTHORACIC ECHOCARDIOGRAM  01/21/2018   Mild LVH.  EF 50 and 55%.  Apical inferior hypokinesis.  Mid-apical anterolateral hypokinesis.  Normal diastolic function for age    TRANSTHORACIC ECHOCARDIOGRAM  09/04/2018   -? Inf STEMI vs. Pericarditis:  Mildly reduced EF of 45 to 50%.  Impaired relaxation-GR 1 DD.  Severe hypokinesis of the anterolateral and inferolateral wall.  Small-moderate anterior pericardial effusion.  Moderate aortic sclerosis but no stenosis.   TRANSURETHRAL RESECTION OF BLADDER TUMOR N/A 01/07/2017   Procedure: TRANSURETHRAL RESECTION OF BLADDER TUMOR (TURBT);  Surgeon: Homero Luster, MD;  Location: [facility];  Service: Urology;  Laterality: N/A;   TRANSURETHRAL RESECTION OF BLADDER TUMOR N/A 01/21/2020   Procedure: TRANSURETHRAL RESECTION OF BLADDER TUMOR (TURBT);  Surgeon: Johnie Nailer  L, MD;  Location: [facility];  Service: Urology;  Laterality: N/A;   TRANSURETHRAL RESECTION OF BLADDER TUMOR N/A 02/09/2021   Procedure: TRANSURETHRAL RESECTION OF BLADDER TUMOR (TURBT);  Surgeon: Marco Severs, MD;  Location: [facility];  Service: Urology;  Laterality: N/A;   URETERAL BIOPSY Left 09/06/2022   Procedure: URETERAL BIOPSY/fulgeration;  Surgeon: Marco Severs, MD;  Location: [facility];  Service: Urology;  Laterality: Left;   WISDOM TOOTH EXTRACTION     Patient Active Problem List   Diagnosis Date Noted   Epidermoid metaplasia esophagus 07/19/2023   Barrett's esophagus without dysplasia 07/19/2023   Abnormal endoscopy of upper gastrointestinal tract 07/19/2023   Genetic testing 02/10/2023   Polyposis of colon 01/25/2023   Family history of prostate cancer    Family history of testicular cancer    Family history of breast cancer    Ureteral cancer, left (HCC) 11/08/2022   Left renal mass 09/06/2022   History of pericarditis 04/26/2019   History of ST elevation  myocardial infarction (STEMI) 09/05/2018   Unstable angina (HCC) 09/04/2018   Presence of drug coated stent in ramus intermedius coronary artery 05/12/2018   COPD with acute exacerbation (HCC) 01/21/2018   Multivessel CAD - CTO dLAD, mCx. DES PCI RI, PTCA of RPDA 01/21/2018   Stable angina (HCC) 01/20/2018   Depression 01/20/2018   Essential hypertension 01/20/2018   Sleep apnea 10/06/2016   Tobacco use disorder 12/19/2015   Type 2 diabetes mellitus with circulatory disorder, without long-term current use of insulin  (HCC) 12/19/2015   Hyperlipidemia with target LDL less than 70 12/19/2015   Bladder cancer (HCC) 12/19/2015   Esophageal reflux 12/19/2015   Essential tremor 12/19/2015   History of stroke 12/19/2015    PCP: Authur Leghorn, MD   REFERRING PROVIDER: Authur Leghorn, MD   REFERRING DIAG: poor balance  THERAPY DIAG:  Unsteadiness on feet  Muscle weakness (generalized)  Rationale for Evaluation and Treatment: Rehabilitation  ONSET DATE: Winter 2024  SUBJECTIVE:   SUBJECTIVE STATEMENT: Patient reports that he feels better today than he did earlier this week.   PERTINENT HISTORY: Type 2 diabetes, angina, hypertension, COPD, history of cancer, history of a stroke, history of a myocardial infarction, tremors, arthritis, and depression PAIN:  Are you having pain? Yes: NPRS scale: no number given/10 Pain location: back, shoulders, and knees Pain description: aching, sore   PRECAUTIONS: Fall  RED FLAGS: None   WEIGHT BEARING RESTRICTIONS: No  FALLS:  Has patient fallen in last 6 months? Yes. Number of falls 2  LIVING ENVIRONMENT: Lives with: lives alone Lives in: House/apartment Stairs: Yes: Internal: 1 steps; none Has following equipment at home: Ramped entry  OCCUPATION: retired  PLOF: Independent with basic ADLs  PATIENT GOALS: improved safety, be able to use his chainsaw, improved strength, and be able to pick up things off the  floor  NEXT MD VISIT: unsure  OBJECTIVE:  Note: Objective measures were completed at Evaluation unless otherwise noted.  COGNITION: Overall cognitive status: Within functional limits for tasks assessed     SENSATION: Light touch: no significant deficits noted Patient reports numbness in both legs with the left being worse and more frequent than the right  LOWER EXTREMITY ROM: WFL for activities assessed  LOWER EXTREMITY MMT:  MMT Right eval Left eval  Hip flexion 4-/5; hip pain 3+/5  Hip extension    Hip abduction    Hip adduction    Hip internal rotation    Hip external rotation  Knee flexion 4/5 5/5  Knee extension 3+/5 5/5  Ankle dorsiflexion 3/5 3/5  Ankle plantarflexion    Ankle inversion    Ankle eversion     (Blank rows = not tested)  FUNCTIONAL TESTS:  5 times sit to stand: 1:05 with BUE support from armrests Timed up and go (TUG): 24.17 seconds 30 second sit to stand: 3 reps  GAIT: Assistive device utilized: None Level of assistance: Complete Independence Comments: Decreased gait speed with poor lateral stability and foot clearance bilaterally                                                                                                                                TREATMENT DATE:      10/11/23                                 EXERCISE LOG  Exercise Repetitions and Resistance Comments  Nustep Lvl 4 x 15 mins   Rockerboard 4 mins   Toe Taps 6" step x 3 mins   Standing Marches Airex x 2 mins   Side-stepping Airex x 4 mins   Tandem Gait Airex x 4 mins   NBOS Ball Toss 4 mins   Seated Ham Cur[place] x 25 reps bil    Blank cell = exercise not performed today   10/07/23     EXERCISE LOG  Exercise Repetitions and Resistance Comments  Nustep  L4 x 16 minutes   LAQs 5# x 20 reps bil   Seated marching  5# x 20 reps bil   Standing heel raise  15 reps no UE support   TUG 18 seconds   5 STS 42 seconds   Side-stepping Airex x 3 mins    Blank cell  = exercise not performed today                                    09/30/23 EXERCISE LOG  Exercise Repetitions and Resistance Comments  Nustep   L4 x 15 minutes   Pallof press  Red t-band x 2 x 8 reps each    Ball reach out  4# x 3 x 8 reps    Seated clams  Red t-band x 3 x 15 reps   Seated heel raise 3 minutes   Seated toe raise  3 x 8 reps    Seated hip ADD isometric  2 minutes w/ 3 second hold     Blank cell = exercise not performed today   PATIENT EDUCATION:  Education details: safety and exercising Person educated: Patient Education method: Explanation Education comprehension: verbalized understanding  HOME EXERCISE PROGRAM:   ASSESSMENT:  CLINICAL IMPRESSION:  Pt arrives for today's treatment session denying any pain.  Today's treatment session concentrating on balance exercises.  Pt requiring intermittent use  of of bil and single UE support with balances exercises for safety and stability.  Pt challenged by standing toe taps and standing marches on Airex pad.  Pt reports feeling good, but tired at completion of today's treatment session.   OBJECTIVE IMPAIRMENTS: Abnormal gait, decreased activity tolerance, decreased balance, decreased mobility, difficulty walking, decreased strength, and pain.   ACTIVITY LIMITATIONS: carrying, lifting, bending, standing, stairs, transfers, bathing, and locomotion level  PARTICIPATION LIMITATIONS: meal prep, cleaning, laundry, shopping, community activity, and yard work  PERSONAL FACTORS: Age, Past/current experiences, Time since onset of injury/illness/exacerbation, and 3+ comorbidities: Type 2 diabetes, angina, hypertension, COPD, history of cancer, history of a stroke, history of a myocardial infarction, tremors, arthritis, and depression are also affecting patient's functional outcome.   REHAB POTENTIAL: Fair    CLINICAL DECISION MAKING: Evolving/moderate complexity  EVALUATION COMPLEXITY: Moderate   GOALS: Goals reviewed with  patient? Yes  SHORT TERM GOALS: Target date: 10/03/23 Patient will be independent with his initial HEP. Baseline: Goal status: MET  2.  Patient will improve his timed up and go time to 19 seconds or less for improved functional mobility. Baseline: 5/16: 18.6 seconds Goal status: MET  3.  Patient will improve his 5 times sit to stand time to 45 seconds or less for improved lower extremity power. Baseline: 5/16: 42.6 seconds Goal status: MET  LONG TERM GOALS: Target date: 10/24/23  Patient will be independent with his advanced HEP. Baseline:  Goal status: IN PROGRESS  2.  Patient will be able to safely pick up items from the floor for improved household independence. Baseline:  Goal status: IN PROGRESS  3.  Patient will improve his 5 times sit to stand time to 30 seconds or less for improved lower extremity power. Baseline:  Goal status: IN PROGRESS  4.  Patient will improve his timed up and go time to 15 seconds or less to reduce his fall risk. Baseline:  Goal status: IN PROGRESS  5.  Patient will improve his 30 second sit to stand test to at least 5 repetitions for improved lower extremity power needed for transfers. Baseline:  Goal status: IN PROGRESS  PLAN:  PT FREQUENCY: 2x/week  PT DURATION: 6 weeks  PLANNED INTERVENTIONS: 97164- PT Re-evaluation, 97750- Physical Performance Testing, 97110-Therapeutic exercises, 97530- Therapeutic activity, 97112- Neuromuscular re-education, 97535- Self Care, 16109- Manual therapy, [id number]- Gait training, Patient/Family education, Balance training, and Stair training  PLAN FOR NEXT SESSION: Nustep, lower extremity strengthening, balance interventions, and gait training   Deryl Flora, PTA 10/11/2023, 2:40 PM

## 2023-10-14 ENCOUNTER — Ambulatory Visit

## 2023-10-14 DIAGNOSIS — R2681 Unsteadiness on feet: Secondary | ICD-10-CM

## 2023-10-14 DIAGNOSIS — M6281 Muscle weakness (generalized): Secondary | ICD-10-CM

## 2023-10-14 NOTE — Therapy (Signed)
 OUTPATIENT PHYSICAL THERAPY LOWER EXTREMITY TREATMENT   Patient Name: Andrew [name] MRN: [id number] DOB:December 30, 1950, 73 y.o., male Today's Date: 10/14/2023  END OF SESSION:  PT End of Session - 10/14/23 1110     Visit Number 8    Number of Visits 12    Date for PT Re-Evaluation 11/18/23    PT Start Time 1102    PT Stop Time 1148    PT Time Calculation (min) 46 min    Activity Tolerance Patient tolerated treatment well    Behavior During Therapy [facility] for tasks assessed/performed               Past Medical History:  Diagnosis Date   Acute viral pericarditis 09/03/2018   Inferior STE - but Negative Troponin.  CP &SOB.  Felt to be Viral.   Anemia    Arthritis    Bladder cancer (HCC) 2014   CAD (coronary artery disease)    Cataract    bilateral   Complication of anesthesia    Depression    Diabetes mellitus without complication (HCC)    Essential hypertension 01/20/2018   in the past no longer on medication   Family history of breast cancer    Family history of prostate cancer    Family history of testicular cancer    GERD (gastroesophageal reflux disease)    Heart attack (HCC) 03/2022   Hx of adenomatous colonic polyps 09/2017   colonoscopy   Hyperlipidemia with target LDL less than 70 12/19/2015   Intraoperative floppy iris syndrome (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 DES PCI RI, PTCA of RPDA 01/21/2018   12/2017 - Cath for ? STEMI -> distal/apical LAD & m-dCx CTO (unable to cross Cx).  Mod rPDA. Severe RI - DES PCI.  08/2018 - Cath for ? Inf STEMI - RI stent patent & CTO dLAD/mCx. Progression of rPDA 95% -> PTCA only.  Thought to be Pericarditis & not MI (troponin negative).    Neuromuscular disorder (HCC)    Polyposis of colon 01/25/2023   Sleep apnea    BiPAP not currently being used   Status post tendon repair 1989   STEMI (ST elevation myocardial infarction) (HCC) 01/20/2018   Cardiac cath January 21, 2018: Severe  multivessel disease with unclear lesion, but opted for PCI/DES x1 to the RI.  Appeared to have CTO of the distal LAD and circumflex as well as moderate mid LAD and diagonal disease as well as PDA.[name]  Unable to cross circumflex lesion.   STEMI (ST elevation myocardial infarction) (HCC) 03/2022   Stroke (HCC) 11/2015   At times pt has dizziness with loss of vision   Tremors of nervous system 2011   Past Surgical History:  Procedure Laterality Date   APPENDECTOMY     BLADDER SURGERY     COLONOSCOPY     CORONARY STENT INTERVENTION N/A 01/21/2018   Procedure: CORONARY STENT INTERVENTION;  Surgeon: Arleen Lacer, MD;  Location: [facility] INVASIVE CV LAB;  Service: Cardiovascular;  Laterality: N/A;  95% Ramus Intermedius -PCI with synergy DES 2.25 mm x 12 mm postdilated 2.4 mm.   CORONARY/GRAFT ACUTE MI REVASCULARIZATION N/A 01/21/2018   Procedure: Coronary/Graft Acute MI Revascularization;  Surgeon: Arleen Lacer, MD;  Location: [facility] INVASIVE CV LAB;  Service: Cardiovascular;  Laterality: N/A;  attempted revas to distal CFX;    CORONARY/GRAFT ACUTE MI REVASCULARIZATION N/A 09/04/2018   Procedure: Coronary/Graft Acute MI Revascularization;  Surgeon: Arnoldo Lapping,  MD;  Location: [facility] INVASIVE CV LAB:: PTCA/POBA of rPDA (2.0 mm balloon)   CYSTOSCOPY N/A 01/21/2020   Procedure: CYSTOSCOPY;  Surgeon: Marco Severs, MD;  Location: [facility];  Service: Urology;  Laterality: N/A;   CYSTOSCOPY N/A 02/09/2021   Procedure: CYSTOSCOPY;  Surgeon: Marco Severs, MD;  Location: [facility];  Service: Urology;  Laterality: N/A;   CYSTOSCOPY Left 11/08/2022   Procedure: CYSTOSCOPY;  Surgeon: Marco Severs, MD;  Location: [facility];  Service: Urology;  Laterality: Left;   CYSTOSCOPY W/ RETROGRADES Bilateral 08/22/2015   Procedure: CYSTOSCOPY WITH RETROGRADE PYELOGRAM;  Surgeon: Homero Luster, MD;  Location: [facility];  Service: Urology;  Laterality: Bilateral;   CYSTOSCOPY W/ RETROGRADES Bilateral 01/16/2016   Procedure:  CYSTOSCOPY WITH RETROGRADE PYELOGRAM;  Surgeon: Homero Luster, MD;  Location: [facility];  Service: Urology;  Laterality: Bilateral;   CYSTOSCOPY W/ RETROGRADES Bilateral 01/07/2017   Procedure: CYSTOSCOPY WITH RETROGRADE PYELOGRAM;  Surgeon: Homero Luster, MD;  Location: [facility];  Service: Urology;  Laterality: Bilateral;   CYSTOSCOPY WITH BIOPSY N/A 08/22/2015   Procedure: CYSTOSCOPY WITH BLADDER BIOPSY;  Surgeon: Homero Luster, MD;  Location: [facility];  Service: Urology;  Laterality: N/A;   CYSTOSCOPY WITH BIOPSY N/A 01/16/2016   Procedure: CYSTOSCOPY WITH BLADDER BIOPSY;  Surgeon: Homero Luster, MD;  Location: [facility];  Service: Urology;  Laterality: N/A;   CYSTOSCOPY WITH FULGERATION N/A 01/16/2016   Procedure: CYSTOSCOPY WITH FULGERATION;  Surgeon: Homero Luster, MD;  Location: [facility];  Service: Urology;  Laterality: N/A;   CYSTOSCOPY/RETROGRADE/URETEROSCOPY Left 09/06/2022   Procedure: CYSTOSCOPY/RETROGRADE/URETEROSCOPY/STENT PLACEMENT;  Surgeon: Marco Severs, MD;  Location: [facility];  Service: Urology;  Laterality: Left;   KNEE ARTHROSCOPY Right 1989   LEFT HEART CATH AND CORONARY ANGIOGRAPHY N/A 01/21/2018   Procedure: LEFT HEART CATH AND CORONARY ANGIOGRAPHY;  Surgeon: Arleen Lacer, MD;  Location: [facility] INVASIVE CV LAB;  Service: Cardiovascular;; 95% RI-DES PCI.  80% o-pCX (PTCA) -> dCX 80%-100% CTO (unsuccessful PTCA unable to cross distal lesion with balloon.)  100% CTO dLAD. RPDA 80% and 70% as well as P AV 180%, PA V2 60%.  (too small for PCI)   LEFT HEART CATH AND CORONARY ANGIOGRAPHY N/A 09/04/2018   Procedure: LEFT HEART CATH AND CORONARY ANGIOGRAPHY;  Surgeon: Arnoldo Lapping, MD;  Location: [facility] INVASIVE CV LAB: (presumed Inferior STEMI) = progression of small rPDA -95% (PTCA only). Patent RI stent. CTO of apical LAD & mCx.   ROBOT ASSITED LAPAROSCOPIC NEPHROURETERECTOMY Left 11/08/2022   Procedure: XI ROBOT ASSITED LAPAROSCOPIC NEPHROURETERECTOMY;  Surgeon: Marco Severs, MD;  Location: [facility];   Service: Urology;  Laterality: Left;   ROTATOR CUFF REPAIR Bilateral 2000   rt. leg fracture surgery     TRANSTHORACIC ECHOCARDIOGRAM  01/21/2018   Mild LVH.  EF 50 and 55%.  Apical inferior hypokinesis.  Mid-apical anterolateral hypokinesis.  Normal diastolic function for age    TRANSTHORACIC ECHOCARDIOGRAM  09/04/2018   -? Inf STEMI vs. Pericarditis:  Mildly reduced EF of 45 to 50%.  Impaired relaxation-GR 1 DD.  Severe hypokinesis of the anterolateral and inferolateral wall.  Small-moderate anterior pericardial effusion.  Moderate aortic sclerosis but no stenosis.   TRANSURETHRAL RESECTION OF BLADDER TUMOR N/A 01/07/2017   Procedure: TRANSURETHRAL RESECTION OF BLADDER TUMOR (TURBT);  Surgeon: Homero Luster, MD;  Location: [facility];  Service: Urology;  Laterality: N/A;   TRANSURETHRAL RESECTION OF BLADDER TUMOR N/A 01/21/2020   Procedure: TRANSURETHRAL RESECTION OF BLADDER TUMOR (TURBT);  Surgeon: Johnie Nailer  L, MD;  Location: [facility];  Service: Urology;  Laterality: N/A;   TRANSURETHRAL RESECTION OF BLADDER TUMOR N/A 02/09/2021   Procedure: TRANSURETHRAL RESECTION OF BLADDER TUMOR (TURBT);  Surgeon: Marco Severs, MD;  Location: [facility];  Service: Urology;  Laterality: N/A;   URETERAL BIOPSY Left 09/06/2022   Procedure: URETERAL BIOPSY/fulgeration;  Surgeon: Marco Severs, MD;  Location: [facility];  Service: Urology;  Laterality: Left;   WISDOM TOOTH EXTRACTION     Patient Active Problem List   Diagnosis Date Noted   Epidermoid metaplasia esophagus 07/19/2023   Barrett's esophagus without dysplasia 07/19/2023   Abnormal endoscopy of upper gastrointestinal tract 07/19/2023   Genetic testing 02/10/2023   Polyposis of colon 01/25/2023   Family history of prostate cancer    Family history of testicular cancer    Family history of breast cancer    Ureteral cancer, left (HCC) 11/08/2022   Left renal mass 09/06/2022   History of pericarditis 04/26/2019   History of ST elevation  myocardial infarction (STEMI) 09/05/2018   Unstable angina (HCC) 09/04/2018   Presence of drug coated stent in ramus intermedius coronary artery 05/12/2018   COPD with acute exacerbation (HCC) 01/21/2018   Multivessel CAD - CTO dLAD, mCx. DES PCI RI, PTCA of RPDA 01/21/2018   Stable angina (HCC) 01/20/2018   Depression 01/20/2018   Essential hypertension 01/20/2018   Sleep apnea 10/06/2016   Tobacco use disorder 12/19/2015   Type 2 diabetes mellitus with circulatory disorder, without long-term current use of insulin  (HCC) 12/19/2015   Hyperlipidemia with target LDL less than 70 12/19/2015   Bladder cancer (HCC) 12/19/2015   Esophageal reflux 12/19/2015   Essential tremor 12/19/2015   History of stroke 12/19/2015    PCP: Authur Leghorn, MD   REFERRING PROVIDER: Authur Leghorn, MD   REFERRING DIAG: poor balance  THERAPY DIAG:  Unsteadiness on feet  Muscle weakness (generalized)  Rationale for Evaluation and Treatment: Rehabilitation  ONSET DATE: Winter 2024  SUBJECTIVE:   SUBJECTIVE STATEMENT: Patient reports that he feels better today than he did earlier this week.   PERTINENT HISTORY: Type 2 diabetes, angina, hypertension, COPD, history of cancer, history of a stroke, history of a myocardial infarction, tremors, arthritis, and depression PAIN:  Are you having pain? Yes: NPRS scale: no number given/10 Pain location: back, shoulders, and knees Pain description: aching, sore   PRECAUTIONS: Fall  RED FLAGS: None   WEIGHT BEARING RESTRICTIONS: No  FALLS:  Has patient fallen in last 6 months? Yes. Number of falls 2  LIVING ENVIRONMENT: Lives with: lives alone Lives in: House/apartment Stairs: Yes: Internal: 1 steps; none Has following equipment at home: Ramped entry  OCCUPATION: retired  PLOF: Independent with basic ADLs  PATIENT GOALS: improved safety, be able to use his chainsaw, improved strength, and be able to pick up things off the  floor  NEXT MD VISIT: unsure  OBJECTIVE:  Note: Objective measures were completed at Evaluation unless otherwise noted.  COGNITION: Overall cognitive status: Within functional limits for tasks assessed     SENSATION: Light touch: no significant deficits noted Patient reports numbness in both legs with the left being worse and more frequent than the right  LOWER EXTREMITY ROM: WFL for activities assessed  LOWER EXTREMITY MMT:  MMT Right eval Left eval  Hip flexion 4-/5; hip pain 3+/5  Hip extension    Hip abduction    Hip adduction    Hip internal rotation    Hip external rotation  Knee flexion 4/5 5/5  Knee extension 3+/5 5/5  Ankle dorsiflexion 3/5 3/5  Ankle plantarflexion    Ankle inversion    Ankle eversion     (Blank rows = not tested)  FUNCTIONAL TESTS:  5 times sit to stand: 1:05 with BUE support from armrests Timed up and go (TUG): 24.17 seconds 30 second sit to stand: 3 reps  GAIT: Assistive device utilized: None Level of assistance: Complete Independence Comments: Decreased gait speed with poor lateral stability and foot clearance bilaterally                                                                                                                                TREATMENT DATE:     10/14/23                                 EXERCISE LOG  Exercise Repetitions and Resistance Comments  Nustep  Lvl 4 x 15 mins   Cybex Knee Flexion 30# x 3 mins   Cybex Knee Extensions 10# x 3 mins   Cybex Leg Press 2 plates; seat 8; 3 mins   STS 10 reps no UE support   Side-stepping Airex x 3.5 mins   Tandem Gait Airex x 3.5 mins    Blank cell = exercise not performed today    10/11/23                                 EXERCISE LOG  Exercise Repetitions and Resistance Comments  Nustep Lvl 4 x 15 mins   Rockerboard 4 mins   Toe Taps 6" step x 3 mins   Standing Marches Airex x 2 mins   Side-stepping Airex x 4 mins   Tandem Gait Airex x 4 mins   NBOS Ball Toss  4 mins   Seated Ham Curls [place] x 25 reps bil    Blank cell = exercise not performed today   10/07/23     EXERCISE LOG  Exercise Repetitions and Resistance Comments  Nustep  L4 x 16 minutes   LAQs 5# x 20 reps bil   Seated marching  5# x 20 reps bil   Standing heel raise  15 reps no UE support   TUG 18 seconds   5 STS 42 seconds   Side-stepping Airex x 3 mins    Blank cell = exercise not performed today                                PATIENT EDUCATION:  Education details: safety and exercising Person educated: Patient Education method: Explanation Education comprehension: verbalized understanding  HOME EXERCISE PROGRAM:   ASSESSMENT:  CLINICAL IMPRESSION:  Pt arrives for today's treatment session reporting bil knee and low back pain.  Pt introduced to cybex machinery today with good results.  Pt requiring cues for eccentric control.  Pt able to tolerate increased time with balance exercises with continued use of UE support for safety and stability.  Pt reports feeling "good" at completion of today's treatment session.   OBJECTIVE IMPAIRMENTS: Abnormal gait, decreased activity tolerance, decreased balance, decreased mobility, difficulty walking, decreased strength, and pain.   ACTIVITY LIMITATIONS: carrying, lifting, bending, standing, stairs, transfers, bathing, and locomotion level  PARTICIPATION LIMITATIONS: meal prep, cleaning, laundry, shopping, community activity, and yard work  PERSONAL FACTORS: Age, Past/current experiences, Time since onset of injury/illness/exacerbation, and 3+ comorbidities: Type 2 diabetes, angina, hypertension, COPD, history of cancer, history of a stroke, history of a myocardial infarction, tremors, arthritis, and depression are also affecting patient's functional outcome.   REHAB POTENTIAL: Fair    CLINICAL DECISION MAKING: Evolving/moderate complexity  EVALUATION COMPLEXITY: Moderate   GOALS: Goals reviewed with patient? Yes  SHORT TERM  GOALS: Target date: 10/03/23 Patient will be independent with his initial HEP. Baseline: Goal status: MET  2.  Patient will improve his timed up and go time to 19 seconds or less for improved functional mobility. Baseline: 5/16: 18.6 seconds Goal status: MET  3.  Patient will improve his 5 times sit to stand time to 45 seconds or less for improved lower extremity power. Baseline: 5/16: 42.6 seconds Goal status: MET  LONG TERM GOALS: Target date: 10/24/23  Patient will be independent with his advanced HEP. Baseline:  Goal status: IN PROGRESS  2.  Patient will be able to safely pick up items from the floor for improved household independence. Baseline:  Goal status: IN PROGRESS  3.  Patient will improve his 5 times sit to stand time to 30 seconds or less for improved lower extremity power. Baseline:  Goal status: IN PROGRESS  4.  Patient will improve his timed up and go time to 15 seconds or less to reduce his fall risk. Baseline:  Goal status: IN PROGRESS  5.  Patient will improve his 30 second sit to stand test to at least 5 repetitions for improved lower extremity power needed for transfers. Baseline:  Goal status: IN PROGRESS  PLAN:  PT FREQUENCY: 2x/week  PT DURATION: 6 weeks  PLANNED INTERVENTIONS: 97164- PT Re-evaluation, 97750- Physical Performance Testing, 97110-Therapeutic exercises, 97530- Therapeutic activity, 97112- Neuromuscular re-education, 97535- Self Care, 24401- Manual therapy, [phone]- Gait training, Patient/Family education, Balance training, and Stair training  PLAN FOR NEXT SESSION: Nustep, lower extremity strengthening, balance interventions, and gait training   Deryl Flora, PTA 10/14/2023, 12:03 PM

## 2023-10-18 ENCOUNTER — Encounter

## 2023-10-21 ENCOUNTER — Ambulatory Visit

## 2023-10-21 DIAGNOSIS — M6281 Muscle weakness (generalized): Secondary | ICD-10-CM

## 2023-10-21 DIAGNOSIS — R2681 Unsteadiness on feet: Secondary | ICD-10-CM

## 2023-10-21 NOTE — Therapy (Signed)
 OUTPATIENT PHYSICAL THERAPY LOWER EXTREMITY TREATMENT   Patient Name: Andrew [name] MRN: [id number] DOB:[birth year], 73 y.o., male Today's Date: 10/21/2023  END OF SESSION:  PT End of Session - 10/21/23 1112     Visit Number 9    Number of Visits 12    Date for PT Re-Evaluation 11/18/23    PT Start Time 1100    PT Stop Time 1145    PT Time Calculation (min) 45 min    Activity Tolerance Patient tolerated treatment well    Behavior During Therapy [facility] for tasks assessed/performed                Past Medical History:  Diagnosis Date   Acute viral pericarditis 09/03/2018   Inferior STE - but Negative Troponin.  CP &SOB.  Felt to be Viral.   Anemia    Arthritis    Bladder cancer (HCC) 2014   CAD (coronary artery disease)    Cataract    bilateral   Complication of anesthesia    Depression    Diabetes mellitus without complication (HCC)    Essential hypertension 01/20/2018   in the past no longer on medication   Family history of breast cancer    Family history of prostate cancer    Family history of testicular cancer    GERD (gastroesophageal reflux disease)    Heart attack (HCC) 03/2022   Hx of adenomatous colonic polyps 09/2017   colonoscopy   Hyperlipidemia with target LDL less than 70 12/19/2015   Intraoperative floppy iris syndrome (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 DES PCI RI, PTCA of RPDA 01/21/2018   12/2017 - Cath for ? STEMI -> distal/apical LAD & m-dCx CTO (unable to cross Cx).  Mod rPDA. Severe RI - DES PCI.  08/2018 - Cath for ? Inf STEMI - RI stent patent & CTO dLAD/mCx. Progression of rPDA 95% -> PTCA only.  Thought to be Pericarditis & not MI (troponin negative).    Neuromuscular disorder (HCC)    Polyposis of colon 01/25/2023   Sleep apnea    BiPAP not currently being used   Status post tendon repair 1989   STEMI (ST elevation myocardial infarction) (HCC) 01/20/2018   Cardiac cath January 21, 2018: Severe  multivessel disease with unclear lesion, but opted for PCI/DES x1 to the RI.  Appeared to have CTO of the distal LAD and circumflex as well as moderate mid LAD and diagonal disease as well as PDA.[name]  Unable to cross circumflex lesion.   STEMI (ST elevation myocardial infarction) (HCC) 03/2022   Stroke (HCC) 11/2015   At times pt has dizziness with loss of vision   Tremors of nervous system 2011   Past Surgical History:  Procedure Laterality Date   APPENDECTOMY     BLADDER SURGERY     COLONOSCOPY     CORONARY STENT INTERVENTION N/A 01/21/2018   Procedure: CORONARY STENT INTERVENTION;  Surgeon: Arleen Lacer, MD;  Location: [facility] INVASIVE CV LAB;  Service: Cardiovascular;  Laterality: N/A;  95% Ramus Intermedius -PCI with synergy DES 2.25 mm x 12 mm postdilated 2.4 mm.   CORONARY/GRAFT ACUTE MI REVASCULARIZATION N/A 01/21/2018   Procedure: Coronary/Graft Acute MI Revascularization;  Surgeon: Arleen Lacer, MD;  Location: [facility] INVASIVE CV LAB;  Service: Cardiovascular;  Laterality: N/A;  attempted revas to distal CFX;    CORONARY/GRAFT ACUTE MI REVASCULARIZATION N/A 09/04/2018   Procedure: Coronary/Graft Acute MI Revascularization;  Surgeon: Arlester Ladd,  Bambi Lever, MD;  Location: [facility] INVASIVE CV LAB:: PTCA/POBA of rPDA (2.0 mm balloon)   CYSTOSCOPY N/A 01/21/2020   Procedure: CYSTOSCOPY;  Surgeon: Marco Severs, MD;  Location: [facility];  Service: Urology;  Laterality: N/A;   CYSTOSCOPY N/A 02/09/2021   Procedure: CYSTOSCOPY;  Surgeon: Marco Severs, MD;  Location: [facility];  Service: Urology;  Laterality: N/A;   CYSTOSCOPY Left 11/08/2022   Procedure: CYSTOSCOPY;  Surgeon: Marco Severs, MD;  Location: [facility];  Service: Urology;  Laterality: Left;   CYSTOSCOPY W/ RETROGRADES Bilateral 08/22/2015   Procedure: CYSTOSCOPY WITH RETROGRADE PYELOGRAM;  Surgeon: Homero Luster, MD;  Location: [facility];  Service: Urology;  Laterality: Bilateral;   CYSTOSCOPY W/ RETROGRADES Bilateral 01/16/2016   Procedure:  CYSTOSCOPY WITH RETROGRADE PYELOGRAM;  Surgeon: Homero Luster, MD;  Location: [facility];  Service: Urology;  Laterality: Bilateral;   CYSTOSCOPY W/ RETROGRADES Bilateral 01/07/2017   Procedure: CYSTOSCOPY WITH RETROGRADE PYELOGRAM;  Surgeon: Homero Luster, MD;  Location: [facility];  Service: Urology;  Laterality: Bilateral;   CYSTOSCOPY WITH BIOPSY N/A 08/22/2015   Procedure: CYSTOSCOPY WITH BLADDER BIOPSY;  Surgeon: Homero Luster, MD;  Location: [facility];  Service: Urology;  Laterality: N/A;   CYSTOSCOPY WITH BIOPSY N/A 01/16/2016   Procedure: CYSTOSCOPY WITH BLADDER BIOPSY;  Surgeon: Homero Luster, MD;  Location: [facility];  Service: Urology;  Laterality: N/A;   CYSTOSCOPY WITH FULGERATION N/A 01/16/2016   Procedure: CYSTOSCOPY WITH FULGERATION;  Surgeon: Homero Luster, MD;  Location: [facility];  Service: Urology;  Laterality: N/A;   CYSTOSCOPY/RETROGRADE/URETEROSCOPY Left 09/06/2022   Procedure: CYSTOSCOPY/RETROGRADE/URETEROSCOPY/STENT PLACEMENT;  Surgeon: Marco Severs, MD;  Location: [facility];  Service: Urology;  Laterality: Left;   KNEE ARTHROSCOPY Right 1989   LEFT HEART CATH AND CORONARY ANGIOGRAPHY N/A 01/21/2018   Procedure: LEFT HEART CATH AND CORONARY ANGIOGRAPHY;  Surgeon: Arleen Lacer, MD;  Location: [facility] INVASIVE CV LAB;  Service: Cardiovascular;; 95% RI-DES PCI.  80% o-pCX (PTCA) -> dCX 80%-100% CTO (unsuccessful PTCA unable to cross distal lesion with balloon.)  100% CTO dLAD. RPDA 80% and 70% as well as P AV 180%, PA V2 60%.  (too small for PCI)   LEFT HEART CATH AND CORONARY ANGIOGRAPHY N/A 09/04/2018   Procedure: LEFT HEART CATH AND CORONARY ANGIOGRAPHY;  Surgeon: Arnoldo Lapping, MD;  Location: [facility] INVASIVE CV LAB: (presumed Inferior STEMI) = progression of small rPDA -95% (PTCA only). Patent RI stent. CTO of apical LAD & mCx.   ROBOT ASSITED LAPAROSCOPIC NEPHROURETERECTOMY Left 11/08/2022   Procedure: XI ROBOT ASSITED LAPAROSCOPIC NEPHROURETERECTOMY;  Surgeon: Marco Severs, MD;  Location: [facility];   Service: Urology;  Laterality: Left;   ROTATOR CUFF REPAIR Bilateral 2000   rt. leg fracture surgery     TRANSTHORACIC ECHOCARDIOGRAM  01/21/2018   Mild LVH.  EF 50 and 55%.  Apical inferior hypokinesis.  Mid-apical anterolateral hypokinesis.  Normal diastolic function for age    TRANSTHORACIC ECHOCARDIOGRAM  09/04/2018   -? Inf STEMI vs. Pericarditis:  Mildly reduced EF of 45 to 50%.  Impaired relaxation-GR 1 DD.  Severe hypokinesis of the anterolateral and inferolateral wall.  Small-moderate anterior pericardial effusion.  Moderate aortic sclerosis but no stenosis.   TRANSURETHRAL RESECTION OF BLADDER TUMOR N/A 01/07/2017   Procedure: TRANSURETHRAL RESECTION OF BLADDER TUMOR (TURBT);  Surgeon: Homero Luster, MD;  Location: [facility];  Service: Urology;  Laterality: N/A;   TRANSURETHRAL RESECTION OF BLADDER TUMOR N/A 01/21/2020   Procedure: TRANSURETHRAL RESECTION OF BLADDER TUMOR (TURBT);  Surgeon: Claretta Croft,  Arden Beck, MD;  Location: [facility];  Service: Urology;  Laterality: N/A;   TRANSURETHRAL RESECTION OF BLADDER TUMOR N/A 02/09/2021   Procedure: TRANSURETHRAL RESECTION OF BLADDER TUMOR (TURBT);  Surgeon: Marco Severs, MD;  Location: [facility];  Service: Urology;  Laterality: N/A;   URETERAL BIOPSY Left 09/06/2022   Procedure: URETERAL BIOPSY/fulgeration;  Surgeon: Marco Severs, MD;  Location: [facility];  Service: Urology;  Laterality: Left;   WISDOM TOOTH EXTRACTION     Patient Active Problem List   Diagnosis Date Noted   Epidermoid metaplasia esophagus 07/19/2023   Barrett's esophagus without dysplasia 07/19/2023   Abnormal endoscopy of upper gastrointestinal tract 07/19/2023   Genetic testing 02/10/2023   Polyposis of colon 01/25/2023   Family history of prostate cancer    Family history of testicular cancer    Family history of breast cancer    Ureteral cancer, left (HCC) 11/08/2022   Left renal mass 09/06/2022   History of pericarditis 04/26/2019   History of ST elevation  myocardial infarction (STEMI) 09/05/2018   Unstable angina (HCC) 09/04/2018   Presence of drug coated stent in ramus intermedius coronary artery 05/12/2018   COPD with acute exacerbation (HCC) 01/21/2018   Multivessel CAD - CTO dLAD, mCx. DES PCI RI, PTCA of RPDA 01/21/2018   Stable angina (HCC) 01/20/2018   Depression 01/20/2018   Essential hypertension 01/20/2018   Sleep apnea 10/06/2016   Tobacco use disorder 12/19/2015   Type 2 diabetes mellitus with circulatory disorder, without long-term current use of insulin  (HCC) 12/19/2015   Hyperlipidemia with target LDL less than 70 12/19/2015   Bladder cancer (HCC) 12/19/2015   Esophageal reflux 12/19/2015   Essential tremor 12/19/2015   History of stroke 12/19/2015    PCP: Authur Leghorn, MD   REFERRING PROVIDER: Authur Leghorn, MD   REFERRING DIAG: poor balance  THERAPY DIAG:  Unsteadiness on feet  Muscle weakness (generalized)  Rationale for Evaluation and Treatment: Rehabilitation  ONSET DATE: Winter 2024  SUBJECTIVE:   SUBJECTIVE STATEMENT: Patient reports that he has been having a hard time getting moving today, but he has already been to [place] for another appointment.   PERTINENT HISTORY: Type 2 diabetes, angina, hypertension, COPD, history of cancer, history of a stroke, history of a myocardial infarction, tremors, arthritis, and depression PAIN:  Are you having pain? Yes: NPRS scale: no number given/10 Pain location: back, shoulders, and knees Pain description: aching, sore   PRECAUTIONS: Fall  RED FLAGS: None   WEIGHT BEARING RESTRICTIONS: No  FALLS:  Has patient fallen in last 6 months? Yes. Number of falls 2  LIVING ENVIRONMENT: Lives with: lives alone Lives in: House/apartment Stairs: Yes: Internal: 1 steps; none Has following equipment at home: Ramped entry  OCCUPATION: retired  PLOF: Independent with basic ADLs  PATIENT GOALS: improved safety, be able to use his chainsaw,  improved strength, and be able to pick up things off the floor  NEXT MD VISIT: unsure  OBJECTIVE:  Note: Objective measures were completed at Evaluation unless otherwise noted.  COGNITION: Overall cognitive status: Within functional limits for tasks assessed     SENSATION: Light touch: no significant deficits noted Patient reports numbness in both legs with the left being worse and more frequent than the right  LOWER EXTREMITY ROM: WFL for activities assessed  LOWER EXTREMITY MMT:  MMT Right eval Left eval  Hip flexion 4-/5; hip pain 3+/5  Hip extension    Hip abduction    Hip adduction  Hip internal rotation    Hip external rotation    Knee flexion 4/5 5/5  Knee extension 3+/5 5/5  Ankle dorsiflexion 3/5 3/5  Ankle plantarflexion    Ankle inversion    Ankle eversion     (Blank rows = not tested)  FUNCTIONAL TESTS:  5 times sit to stand: 1:05 with BUE support from armrests Timed up and go (TUG): 24.17 seconds 30 second sit to stand: 3 reps  GAIT: Assistive device utilized: None Level of assistance: Complete Independence Comments: Decreased gait speed with poor lateral stability and foot clearance bilaterally                                                                                                                                TREATMENT DATE:                                     10/21/23 EXERCISE LOG  Exercise Repetitions and Resistance Comments  Nustep  L5 x 17 minutes   Goal assessment See goal section    LAQ 5# x 30 reps each    Rocker board  5 minutes   Standing hip extension  3 minutes  With bent knee; alternating LE   Blank cell = exercise not performed today    10/14/23                                 EXERCISE LOG  Exercise Repetitions and Resistance Comments  Nustep  Lvl 4 x 15 mins   Cybex Knee Flexion 30# x 3 mins   Cybex Knee Extensions 10# x 3 mins   Cybex Leg Press 2 plates; seat 8; 3 mins   STS 10 reps no UE support   Side-stepping  Airex x 3.5 mins   Tandem Gait Airex x 3.5 mins    Blank cell = exercise not performed today    10/11/23                                 EXERCISE LOG  Exercise Repetitions and Resistance Comments  Nustep Lvl 4 x 15 mins   Rockerboard 4 mins   Toe Taps 6" step x 3 mins   Standing Marches Airex x 2 mins   Side-stepping Airex x 4 mins   Tandem Gait Airex x 4 mins   NBOS Ball Toss 4 mins   Seated Ham Curls Green x 25 reps bil    Blank cell = exercise not performed today   PATIENT EDUCATION:  Education details: safety and exercising Person educated: Patient Education method: Explanation Education comprehension: verbalized understanding  HOME EXERCISE PROGRAM:   ASSESSMENT:  CLINICAL IMPRESSION:  Patient is making fair progress with skilled physical therapy as evidenced  by his functional mobility and progress toward his goals. He was able to meet all of his short term goals for physical therapy.  However, his mobility regressed slightly which could partially be attributed to his inability to perform his HEP since his last appointment due to having to be out of town. Today's treatment focused on familiar interventions for improved lower extremity strength and stability with moderate difficulty.  Recommend that he continue with his current plan of care to address his remaining impairments to maximize his safety and functional mobility.  OBJECTIVE IMPAIRMENTS: Abnormal gait, decreased activity tolerance, decreased balance, decreased mobility, difficulty walking, decreased strength, and pain.   ACTIVITY LIMITATIONS: carrying, lifting, bending, standing, stairs, transfers, bathing, and locomotion level  PARTICIPATION LIMITATIONS: meal prep, cleaning, laundry, shopping, community activity, and yard work  PERSONAL FACTORS: Age, Past/current experiences, Time since onset of injury/illness/exacerbation, and 3+ comorbidities: Type 2 diabetes, angina, hypertension, COPD, history of cancer, history  of a stroke, history of a myocardial infarction, tremors, arthritis, and depression are also affecting patient's functional outcome.   REHAB POTENTIAL: Fair    CLINICAL DECISION MAKING: Evolving/moderate complexity  EVALUATION COMPLEXITY: Moderate   GOALS: Goals reviewed with patient? Yes  SHORT TERM GOALS: Target date: 10/03/23 Patient will be independent with his initial HEP. Baseline: Goal status: MET  2.  Patient will improve his timed up and go time to 19 seconds or less for improved functional mobility. Baseline: 5/16: 18.6 seconds Goal status: MET  3.  Patient will improve his 5 times sit to stand time to 45 seconds or less for improved lower extremity power. Baseline: 5/16: 42.6 seconds Goal status: MET  LONG TERM GOALS: Target date: 10/24/23  Patient will be independent with his advanced HEP. Baseline: "I have not been doing my exercises much" Goal status: IN PROGRESS  2.  Patient will be able to safely pick up items from the floor for improved household independence. Baseline:  Goal status: MET  3.  Patient will improve his 5 times sit to stand time to 30 seconds or less for improved lower extremity power. Baseline: 47.02 seconds on 10/21/23 Goal status: IN PROGRESS  4.  Patient will improve his timed up and go time to 15 seconds or less to reduce his fall risk. Baseline: 26.82 seconds on 10/21/23 Goal status: IN PROGRESS  5.  Patient will improve his 30 second sit to stand test to at least 5 repetitions for improved lower extremity power needed for transfers. Baseline: 3 reps on 10/21/23 Goal status: IN PROGRESS  PLAN:  PT FREQUENCY: 2x/week  PT DURATION: 6 weeks  PLANNED INTERVENTIONS: 97164- PT Re-evaluation, 97750- Physical Performance Testing, 97110-Therapeutic exercises, 97530- Therapeutic activity, 97112- Neuromuscular re-education, 97535- Self Care, [id number]- Manual therapy, [phone]- Gait training, Patient/Family education, Balance training, and Stair  training  PLAN FOR NEXT SESSION: Nustep, lower extremity strengthening, balance interventions, and gait training   Lane Pinon, PT 10/21/2023, 1:10 PM

## 2023-10-25 ENCOUNTER — Ambulatory Visit: Attending: Family Medicine

## 2023-10-25 DIAGNOSIS — M6281 Muscle weakness (generalized): Secondary | ICD-10-CM | POA: Insufficient documentation

## 2023-10-25 DIAGNOSIS — R2681 Unsteadiness on feet: Secondary | ICD-10-CM | POA: Diagnosis present

## 2023-10-25 NOTE — Therapy (Signed)
 OUTPATIENT PHYSICAL THERAPY LOWER EXTREMITY TREATMENT   Patient Name: Andrew [name] MRN: [id number] DOB:May 26, 1950, 73 y.o., male Today's Date: 10/25/2023  END OF SESSION:  PT End of Session - 10/25/23 1111     Visit Number 10    Number of Visits 12    Date for PT Re-Evaluation 11/18/23    PT Start Time 1106    PT Stop Time 1156    PT Time Calculation (min) 50 min    Activity Tolerance Patient tolerated treatment well    Behavior During Therapy [facility] for tasks assessed/performed                Past Medical History:  Diagnosis Date   Acute viral pericarditis 09/03/2018   Inferior STE - but Negative Troponin.  CP &SOB.  Felt to be Viral.   Anemia    Arthritis    Bladder cancer (HCC) 2014   CAD (coronary artery disease)    Cataract    bilateral   Complication of anesthesia    Depression    Diabetes mellitus without complication (HCC)    Essential hypertension 01/20/2018   in the past no longer on medication   Family history of breast cancer    Family history of prostate cancer    Family history of testicular cancer    GERD (gastroesophageal reflux disease)    Heart attack (HCC) 03/2022   Hx of adenomatous colonic polyps 09/2017   colonoscopy   Hyperlipidemia with target LDL less than 70 12/19/2015   Intraoperative floppy iris syndrome (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 DES PCI RI, PTCA of RPDA 01/21/2018   12/2017 - Cath for ? STEMI -> distal/apical LAD & m-dCx CTO (unable to cross Cx).  Mod rPDA. Severe RI - DES PCI.  08/2018 - Cath for ? Inf STEMI - RI stent patent & CTO dLAD/mCx. Progression of rPDA 95% -> PTCA only.  Thought to be Pericarditis & not MI (troponin negative).    Neuromuscular disorder (HCC)    Polyposis of colon 01/25/2023   Sleep apnea    BiPAP not currently being used   Status post tendon repair 1989   STEMI (ST elevation myocardial infarction) (HCC) 01/20/2018   Cardiac cath January 21, 2018: Severe  multivessel disease with unclear lesion, but opted for PCI/DES x1 to the RI.  Appeared to have CTO of the distal LAD and circumflex as well as moderate mid LAD and diagonal disease as well as PDA.Andrew [name]  Unable to cross circumflex lesion.   STEMI (ST elevation myocardial infarction) (HCC) 03/2022   Stroke (HCC) 11/2015   At times pt has dizziness with loss of vision   Tremors of nervous system 2011   Past Surgical History:  Procedure Laterality Date   APPENDECTOMY     BLADDER SURGERY     COLONOSCOPY     CORONARY STENT INTERVENTION N/A 01/21/2018   Procedure: CORONARY STENT INTERVENTION;  Surgeon: Arleen Lacer, MD;  Location: [facility] INVASIVE CV LAB;  Service: Cardiovascular;  Laterality: N/A;  95% Ramus Intermedius -PCI with synergy DES 2.25 mm x 12 mm postdilated 2.4 mm.   CORONARY/GRAFT ACUTE MI REVASCULARIZATION N/A 01/21/2018   Procedure: Coronary/Graft Acute MI Revascularization;  Surgeon: Arleen Lacer, MD;  Location: [facility] INVASIVE CV LAB;  Service: Cardiovascular;  Laterality: N/A;  attempted revas to distal CFX;    CORONARY/GRAFT ACUTE MI REVASCULARIZATION N/A 09/04/2018   Procedure: Coronary/Graft Acute MI Revascularization;  Surgeon: Arlester Ladd,  Bambi Lever, MD;  Location: [facility] INVASIVE CV LAB:: PTCA/POBA of rPDA (2.0 mm balloon)   CYSTOSCOPY N/A 01/21/2020   Procedure: CYSTOSCOPY;  Surgeon: Marco Severs, MD;  Location: [facility];  Service: Urology;  Laterality: N/A;   CYSTOSCOPY N/A 02/09/2021   Procedure: CYSTOSCOPY;  Surgeon: Marco Severs, MD;  Location: [facility];  Service: Urology;  Laterality: N/A;   CYSTOSCOPY Left 11/08/2022   Procedure: CYSTOSCOPY;  Surgeon: Marco Severs, MD;  Location: [facility];  Service: Urology;  Laterality: Left;   CYSTOSCOPY W/ RETROGRADES Bilateral 08/22/2015   Procedure: CYSTOSCOPY WITH RETROGRADE PYELOGRAM;  Surgeon: Homero Luster, MD;  Location: [facility];  Service: Urology;  Laterality: Bilateral;   CYSTOSCOPY W/ RETROGRADES Bilateral 01/16/2016   Procedure:  CYSTOSCOPY WITH RETROGRADE PYELOGRAM;  Surgeon: Homero Luster, MD;  Location: [facility];  Service: Urology;  Laterality: Bilateral;   CYSTOSCOPY W/ RETROGRADES Bilateral 01/07/2017   Procedure: CYSTOSCOPY WITH RETROGRADE PYELOGRAM;  Surgeon: Homero Luster, MD;  Location: [facility];  Service: Urology;  Laterality: Bilateral;   CYSTOSCOPY WITH BIOPSY N/A 08/22/2015   Procedure: CYSTOSCOPY WITH BLADDER BIOPSY;  Surgeon: Homero Luster, MD;  Location: [facility];  Service: Urology;  Laterality: N/A;   CYSTOSCOPY WITH BIOPSY N/A 01/16/2016   Procedure: CYSTOSCOPY WITH BLADDER BIOPSY;  Surgeon: Homero Luster, MD;  Location: [facility];  Service: Urology;  Laterality: N/A;   CYSTOSCOPY WITH FULGERATION N/A 01/16/2016   Procedure: CYSTOSCOPY WITH FULGERATION;  Surgeon: Homero Luster, MD;  Location: [facility];  Service: Urology;  Laterality: N/A;   CYSTOSCOPY/RETROGRADE/URETEROSCOPY Left 09/06/2022   Procedure: CYSTOSCOPY/RETROGRADE/URETEROSCOPY/STENT PLACEMENT;  Surgeon: Marco Severs, MD;  Location: [facility];  Service: Urology;  Laterality: Left;   KNEE ARTHROSCOPY Right 1989   LEFT HEART CATH AND CORONARY ANGIOGRAPHY N/A 01/21/2018   Procedure: LEFT HEART CATH AND CORONARY ANGIOGRAPHY;  Surgeon: Arleen Lacer, MD;  Location: [facility] INVASIVE CV LAB;  Service: Cardiovascular;; 95% RI-DES PCI.  80% o-pCX (PTCA) -> dCX 80%-100% CTO (unsuccessful PTCA unable to cross distal lesion with balloon.)  100% CTO dLAD. RPDA 80% and 70% as well as P AV 180%, PA V2 60%.  (too small for PCI)   LEFT HEART CATH AND CORONARY ANGIOGRAPHY N/A 09/04/2018   Procedure: LEFT HEART CATH AND CORONARY ANGIOGRAPHY;  Surgeon: Arnoldo Lapping, MD;  Location: [facility] INVASIVE CV LAB: (presumed Inferior STEMI) = progression of small rPDA -95% (PTCA only). Patent RI stent. CTO of apical LAD & mCx.   ROBOT ASSITED LAPAROSCOPIC NEPHROURETERECTOMY Left 11/08/2022   Procedure: XI ROBOT ASSITED LAPAROSCOPIC NEPHROURETERECTOMY;  Surgeon: Marco Severs, MD;  Location: [facility];   Service: Urology;  Laterality: Left;   ROTATOR CUFF REPAIR Bilateral 2000   rt. leg fracture surgery     TRANSTHORACIC ECHOCARDIOGRAM  01/21/2018   Mild LVH.  EF 50 and 55%.  Apical inferior hypokinesis.  Mid-apical anterolateral hypokinesis.  Normal diastolic function for age    TRANSTHORACIC ECHOCARDIOGRAM  09/04/2018   -? Inf STEMI vs. Pericarditis:  Mildly reduced EF of 45 to 50%.  Impaired relaxation-GR 1 DD.  Severe hypokinesis of the anterolateral and inferolateral wall.  Small-moderate anterior pericardial effusion.  Moderate aortic sclerosis but no stenosis.   TRANSURETHRAL RESECTION OF BLADDER TUMOR N/A 01/07/2017   Procedure: TRANSURETHRAL RESECTION OF BLADDER TUMOR (TURBT);  Surgeon: Homero Luster, MD;  Location: [facility];  Service: Urology;  Laterality: N/A;   TRANSURETHRAL RESECTION OF BLADDER TUMOR N/A 01/21/2020   Procedure: TRANSURETHRAL RESECTION OF BLADDER TUMOR (TURBT);  Surgeon: Claretta Croft,  Arden Beck, MD;  Location: [facility];  Service: Urology;  Laterality: N/A;   TRANSURETHRAL RESECTION OF BLADDER TUMOR N/A 02/09/2021   Procedure: TRANSURETHRAL RESECTION OF BLADDER TUMOR (TURBT);  Surgeon: Marco Severs, MD;  Location: [facility];  Service: Urology;  Laterality: N/A;   URETERAL BIOPSY Left 09/06/2022   Procedure: URETERAL BIOPSY/fulgeration;  Surgeon: Marco Severs, MD;  Location: [facility];  Service: Urology;  Laterality: Left;   WISDOM TOOTH EXTRACTION     Patient Active Problem List   Diagnosis Date Noted   Epidermoid metaplasia esophagus 07/19/2023   Barrett's esophagus without dysplasia 07/19/2023   Abnormal endoscopy of upper gastrointestinal tract 07/19/2023   Genetic testing 02/10/2023   Polyposis of colon 01/25/2023   Family history of prostate cancer    Family history of testicular cancer    Family history of breast cancer    Ureteral cancer, left (HCC) 11/08/2022   Left renal mass 09/06/2022   History of pericarditis 04/26/2019   History of ST elevation  myocardial infarction (STEMI) 09/05/2018   Unstable angina (HCC) 09/04/2018   Presence of drug coated stent in ramus intermedius coronary artery 05/12/2018   COPD with acute exacerbation (HCC) 01/21/2018   Multivessel CAD - CTO dLAD, mCx. DES PCI RI, PTCA of RPDA 01/21/2018   Stable angina (HCC) 01/20/2018   Depression 01/20/2018   Essential hypertension 01/20/2018   Sleep apnea 10/06/2016   Tobacco use disorder 12/19/2015   Type 2 diabetes mellitus with circulatory disorder, without long-term current use of insulin  (HCC) 12/19/2015   Hyperlipidemia with target LDL less than 70 12/19/2015   Bladder cancer (HCC) 12/19/2015   Esophageal reflux 12/19/2015   Essential tremor 12/19/2015   History of stroke 12/19/2015    PCP: Authur Leghorn, MD   REFERRING PROVIDER: Authur Leghorn, MD   REFERRING DIAG: poor balance  THERAPY DIAG:  Unsteadiness on feet  Muscle weakness (generalized)  Rationale for Evaluation and Treatment: Rehabilitation  ONSET DATE: Winter 2024  SUBJECTIVE:   SUBJECTIVE STATEMENT: Pt reports 5/10 low back pain today.    PERTINENT HISTORY: Type 2 diabetes, angina, hypertension, COPD, history of cancer, history of a stroke, history of a myocardial infarction, tremors, arthritis, and depression PAIN:  Are you having pain? Yes: NPRS scale: 5/10 Pain location: back, shoulders, and knees Pain description: aching, sore   PRECAUTIONS: Fall  RED FLAGS: None   WEIGHT BEARING RESTRICTIONS: No  FALLS:  Has patient fallen in last 6 months? Yes. Number of falls 2  LIVING ENVIRONMENT: Lives with: lives alone Lives in: House/apartment Stairs: Yes: Internal: 1 steps; none Has following equipment at home: Ramped entry  OCCUPATION: retired  PLOF: Independent with basic ADLs  PATIENT GOALS: improved safety, be able to use his chainsaw, improved strength, and be able to pick up things off the floor  NEXT MD VISIT: unsure  OBJECTIVE:  Note: Objective  measures were completed at Evaluation unless otherwise noted.  COGNITION: Overall cognitive status: Within functional limits for tasks assessed     SENSATION: Light touch: no significant deficits noted Patient reports numbness in both legs with the left being worse and more frequent than the right  LOWER EXTREMITY ROM: WFL for activities assessed  LOWER EXTREMITY MMT:  MMT Right eval Left eval  Hip flexion 4-/5; hip pain 3+/5  Hip extension    Hip abduction    Hip adduction    Hip internal rotation    Hip external rotation    Knee flexion 4/5 5/5  Knee extension 3+/5 5/5  Ankle dorsiflexion 3/5 3/5  Ankle plantarflexion    Ankle inversion    Ankle eversion     (Blank rows = not tested)  FUNCTIONAL TESTS:  5 times sit to stand: 1:05 with BUE support from armrests Timed up and go (TUG): 24.17 seconds 30 second sit to stand: 3 reps  GAIT: Assistive device utilized: None Level of assistance: Complete Independence Comments: Decreased gait speed with poor lateral stability and foot clearance bilaterally                                                                                                                                TREATMENT DATE:    10/25/23                                 EXERCISE LOG  Exercise Repetitions and Resistance Comments  Nustep  Lvl 5 x 15 mins   Cybex Knee Flexion 30# x 3.5 mins   Cybex Knee Extensions 10# x 3.5 mins   Cybex Leg Press 2 plates; seat 8; 3.5 mins   Rockerboard 4 mins   Side-stepping Airex x 4 mins   Tandem Gait Airex x 4 mins    Blank cell = exercise not performed today                                    10/21/23 EXERCISE LOG  Exercise Repetitions and Resistance Comments  Nustep  L5 x 17 minutes   Goal assessment See goal section    LAQ 5# x 30 reps each    Rocker board  5 minutes   Standing hip extension  3 minutes  With bent knee; alternating LE   Blank cell = exercise not performed today    10/14/23                                  EXERCISE LOG  Exercise Repetitions and Resistance Comments  Nustep  Lvl 4 x 15 mins   Cybex Knee Flexion 30# x 3 mins   Cybex Knee Extensions 10# x 3 mins   Cybex Leg Press 2 plates; seat 8; 3 mins   STS 10 reps no UE support   Side-stepping Airex x 3.5 mins   Tandem Gait Airex x 3.5 mins    Blank cell = exercise not performed today   PATIENT EDUCATION:  Education details: safety and exercising Person educated: Patient Education method: Explanation Education comprehension: verbalized understanding  HOME EXERCISE PROGRAM:   ASSESSMENT:  CLINICAL IMPRESSION:  Pt arrives for today's treatment session reporting 5/10 low back pain, but reports feeling good otherwise.   Pt performed strengthening exercises first to fatigue BLE to create greater challenge with balance exercises.  Pt able to tolerate increased time with all cybex and balance exercise today.  Pt denies any change in low back pain at completion of toda'ys treatment session.  OBJECTIVE IMPAIRMENTS: Abnormal gait, decreased activity tolerance, decreased balance, decreased mobility, difficulty walking, decreased strength, and pain.   ACTIVITY LIMITATIONS: carrying, lifting, bending, standing, stairs, transfers, bathing, and locomotion level  PARTICIPATION LIMITATIONS: meal prep, cleaning, laundry, shopping, community activity, and yard work  PERSONAL FACTORS: Age, Past/current experiences, Time since onset of injury/illness/exacerbation, and 3+ comorbidities: Type 2 diabetes, angina, hypertension, COPD, history of cancer, history of a stroke, history of a myocardial infarction, tremors, arthritis, and depression are also affecting patient's functional outcome.   REHAB POTENTIAL: Fair    CLINICAL DECISION MAKING: Evolving/moderate complexity  EVALUATION COMPLEXITY: Moderate   GOALS: Goals reviewed with patient? Yes  SHORT TERM GOALS: Target date: 10/03/23 Patient will be independent with his initial  HEP. Baseline: Goal status: MET  2.  Patient will improve his timed up and go time to 19 seconds or less for improved functional mobility. Baseline: 5/16: 18.6 seconds Goal status: MET  3.  Patient will improve his 5 times sit to stand time to 45 seconds or less for improved lower extremity power. Baseline: 5/16: 42.6 seconds Goal status: MET  LONG TERM GOALS: Target date: 10/24/23  Patient will be independent with his advanced HEP. Baseline: "I have not been doing my exercises much" Goal status: IN PROGRESS  2.  Patient will be able to safely pick up items from the floor for improved household independence. Baseline:  Goal status: MET  3.  Patient will improve his 5 times sit to stand time to 30 seconds or less for improved lower extremity power. Baseline: 47.02 seconds on 10/21/23 Goal status: IN PROGRESS  4.  Patient will improve his timed up and go time to 15 seconds or less to reduce his fall risk. Baseline: 26.82 seconds on 10/21/23 Goal status: IN PROGRESS  5.  Patient will improve his 30 second sit to stand test to at least 5 repetitions for improved lower extremity power needed for transfers. Baseline: 3 reps on 10/21/23 Goal status: IN PROGRESS  PLAN:  PT FREQUENCY: 2x/week  PT DURATION: 6 weeks  PLANNED INTERVENTIONS: 97164- PT Re-evaluation, 97750- Physical Performance Testing, 97110-Therapeutic exercises, 97530- Therapeutic activity, 97112- Neuromuscular re-education, 97535- Self Care, [id number]- Manual therapy, [phone]- Gait training, Patient/Family education, Balance training, and Stair training  PLAN FOR NEXT SESSION: Nustep, lower extremity strengthening, balance interventions, and gait training   Deryl Flora, PTA 10/25/2023, 12:04 PM

## 2023-10-28 ENCOUNTER — Ambulatory Visit

## 2023-10-28 DIAGNOSIS — R2681 Unsteadiness on feet: Secondary | ICD-10-CM

## 2023-10-28 DIAGNOSIS — M6281 Muscle weakness (generalized): Secondary | ICD-10-CM

## 2023-10-28 NOTE — Therapy (Signed)
 OUTPATIENT PHYSICAL THERAPY LOWER EXTREMITY TREATMENT   Patient Name: Andrew [name] MRN: [id number] DOB:1950/07/03, 73 y.o., male Today's Date: 10/28/2023  END OF SESSION:  PT End of Session - 10/28/23 1058     Visit Number 11    Number of Visits 12    Date for PT Re-Evaluation 11/18/23    PT Start Time 1100    PT Stop Time 1145    PT Time Calculation (min) 45 min    Activity Tolerance Patient tolerated treatment well    Behavior During Therapy [facility] for tasks assessed/performed                 Past Medical History:  Diagnosis Date   Acute viral pericarditis 09/03/2018   Inferior STE - but Negative Troponin.  CP &SOB.  Felt to be Viral.   Anemia    Arthritis    Bladder cancer (HCC) 2014   CAD (coronary artery disease)    Cataract    bilateral   Complication of anesthesia    Depression    Diabetes mellitus without complication (HCC)    Essential hypertension 01/20/2018   in the past no longer on medication   Family history of breast cancer    Family history of prostate cancer    Family history of testicular cancer    GERD (gastroesophageal reflux disease)    Heart attack (HCC) 03/2022   Hx of adenomatous colonic polyps 09/2017   colonoscopy   Hyperlipidemia with target LDL less than 70 12/19/2015   Intraoperative floppy iris syndrome (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 DES PCI RI, PTCA of RPDA 01/21/2018   12/2017 - Cath for ? STEMI -> distal/apical LAD & m-dCx CTO (unable to cross Cx).  Mod rPDA. Severe RI - DES PCI.  08/2018 - Cath for ? Inf STEMI - RI stent patent & CTO dLAD/mCx. Progression of rPDA 95% -> PTCA only.  Thought to be Pericarditis & not MI (troponin negative).    Neuromuscular disorder (HCC)    Polyposis of colon 01/25/2023   Sleep apnea    BiPAP not currently being used   Status post tendon repair 1989   STEMI (ST elevation myocardial infarction) (HCC) 01/20/2018   Cardiac cath January 21, 2018: Severe  multivessel disease with unclear lesion, but opted for PCI/DES x1 to the RI.  Appeared to have CTO of the distal LAD and circumflex as well as moderate mid LAD and diagonal disease as well as PDA.[name]  Unable to cross circumflex lesion.   STEMI (ST elevation myocardial infarction) (HCC) 03/2022   Stroke (HCC) 11/2015   At times pt has dizziness with loss of vision   Tremors of nervous system 2011   Past Surgical History:  Procedure Laterality Date   APPENDECTOMY     BLADDER SURGERY     COLONOSCOPY     CORONARY STENT INTERVENTION N/A 01/21/2018   Procedure: CORONARY STENT INTERVENTION;  Surgeon: Arleen Lacer, MD;  Location: [facility] INVASIVE CV LAB;  Service: Cardiovascular;  Laterality: N/A;  95% Ramus Intermedius -PCI with synergy DES 2.25 mm x 12 mm postdilated 2.4 mm.   CORONARY/GRAFT ACUTE MI REVASCULARIZATION N/A 01/21/2018   Procedure: Coronary/Graft Acute MI Revascularization;  Surgeon: Arleen Lacer, MD;  Location: [facility] INVASIVE CV LAB;  Service: Cardiovascular;  Laterality: N/A;  attempted revas to distal CFX;    CORONARY/GRAFT ACUTE MI REVASCULARIZATION N/A 09/04/2018   Procedure: Coronary/Graft Acute MI Revascularization;  Surgeon:  Arnoldo Lapping, MD;  Location: [facility] INVASIVE CV LAB:: PTCA/POBA of rPDA (2.0 mm balloon)   CYSTOSCOPY N/A 01/21/2020   Procedure: CYSTOSCOPY;  Surgeon: Marco Severs, MD;  Location: [facility];  Service: Urology;  Laterality: N/A;   CYSTOSCOPY N/A 02/09/2021   Procedure: CYSTOSCOPY;  Surgeon: Marco Severs, MD;  Location: [facility];  Service: Urology;  Laterality: N/A;   CYSTOSCOPY Left 11/08/2022   Procedure: CYSTOSCOPY;  Surgeon: Marco Severs, MD;  Location: [facility];  Service: Urology;  Laterality: Left;   CYSTOSCOPY W/ RETROGRADES Bilateral 08/22/2015   Procedure: CYSTOSCOPY WITH RETROGRADE PYELOGRAM;  Surgeon: Homero Luster, MD;  Location: [facility];  Service: Urology;  Laterality: Bilateral;   CYSTOSCOPY W/ RETROGRADES Bilateral 01/16/2016   Procedure:  CYSTOSCOPY WITH RETROGRADE PYELOGRAM;  Surgeon: Homero Luster, MD;  Location: [facility];  Service: Urology;  Laterality: Bilateral;   CYSTOSCOPY W/ RETROGRADES Bilateral 01/07/2017   Procedure: CYSTOSCOPY WITH RETROGRADE PYELOGRAM;  Surgeon: Homero Luster, MD;  Location: [facility];  Service: Urology;  Laterality: Bilateral;   CYSTOSCOPY WITH BIOPSY N/A 08/22/2015   Procedure: CYSTOSCOPY WITH BLADDER BIOPSY;  Surgeon: Homero Luster, MD;  Location: [facility];  Service: Urology;  Laterality: N/A;   CYSTOSCOPY WITH BIOPSY N/A 01/16/2016   Procedure: CYSTOSCOPY WITH BLADDER BIOPSY;  Surgeon: Homero Luster, MD;  Location: [facility];  Service: Urology;  Laterality: N/A;   CYSTOSCOPY WITH FULGERATION N/A 01/16/2016   Procedure: CYSTOSCOPY WITH FULGERATION;  Surgeon: Homero Luster, MD;  Location: [facility];  Service: Urology;  Laterality: N/A;   CYSTOSCOPY/RETROGRADE/URETEROSCOPY Left 09/06/2022   Procedure: CYSTOSCOPY/RETROGRADE/URETEROSCOPY/STENT PLACEMENT;  Surgeon: Marco Severs, MD;  Location: [facility];  Service: Urology;  Laterality: Left;   KNEE ARTHROSCOPY Right 1989   LEFT HEART CATH AND CORONARY ANGIOGRAPHY N/A 01/21/2018   Procedure: LEFT HEART CATH AND CORONARY ANGIOGRAPHY;  Surgeon: Arleen Lacer, MD;  Location: [facility] INVASIVE CV LAB;  Service: Cardiovascular;; 95% RI-DES PCI.  80% o-pCX (PTCA) -> dCX 80%-100% CTO (unsuccessful PTCA unable to cross distal lesion with balloon.)  100% CTO dLAD. RPDA 80% and 70% as well as P AV 180%, PA V2 60%.  (too small for PCI)   LEFT HEART CATH AND CORONARY ANGIOGRAPHY N/A 09/04/2018   Procedure: LEFT HEART CATH AND CORONARY ANGIOGRAPHY;  Surgeon: Arnoldo Lapping, MD;  Location: [facility] INVASIVE CV LAB: (presumed Inferior STEMI) = progression of small rPDA -95% (PTCA only). Patent RI stent. CTO of apical LAD & mCx.   ROBOT ASSITED LAPAROSCOPIC NEPHROURETERECTOMY Left 11/08/2022   Procedure: XI ROBOT ASSITED LAPAROSCOPIC NEPHROURETERECTOMY;  Surgeon: Marco Severs, MD;  Location: [facility];   Service: Urology;  Laterality: Left;   ROTATOR CUFF REPAIR Bilateral 2000   rt. leg fracture surgery     TRANSTHORACIC ECHOCARDIOGRAM  01/21/2018   Mild LVH.  EF 50 and 55%.  Apical inferior hypokinesis.  Mid-apical anterolateral hypokinesis.  Normal diastolic function for age    TRANSTHORACIC ECHOCARDIOGRAM  09/04/2018   -? Inf STEMI vs. Pericarditis:  Mildly reduced EF of 45 to 50%.  Impaired relaxation-GR 1 DD.  Severe hypokinesis of the anterolateral and inferolateral wall.  Small-moderate anterior pericardial effusion.  Moderate aortic sclerosis but no stenosis.   TRANSURETHRAL RESECTION OF BLADDER TUMOR N/A 01/07/2017   Procedure: TRANSURETHRAL RESECTION OF BLADDER TUMOR (TURBT);  Surgeon: Homero Luster, MD;  Location: [facility];  Service: Urology;  Laterality: N/A;   TRANSURETHRAL RESECTION OF BLADDER TUMOR N/A 01/21/2020   Procedure: TRANSURETHRAL RESECTION OF BLADDER TUMOR (TURBT);  Surgeon:  McKenzie, Arden Beck, MD;  Location: [facility];  Service: Urology;  Laterality: N/A;   TRANSURETHRAL RESECTION OF BLADDER TUMOR N/A 02/09/2021   Procedure: TRANSURETHRAL RESECTION OF BLADDER TUMOR (TURBT);  Surgeon: Marco Severs, MD;  Location: [facility];  Service: Urology;  Laterality: N/A;   URETERAL BIOPSY Left 09/06/2022   Procedure: URETERAL BIOPSY/fulgeration;  Surgeon: Marco Severs, MD;  Location: [facility];  Service: Urology;  Laterality: Left;   WISDOM TOOTH EXTRACTION     Patient Active Problem List   Diagnosis Date Noted   Epidermoid metaplasia esophagus 07/19/2023   Barrett's esophagus without dysplasia 07/19/2023   Abnormal endoscopy of upper gastrointestinal tract 07/19/2023   Genetic testing 02/10/2023   Polyposis of colon 01/25/2023   Family history of prostate cancer    Family history of testicular cancer    Family history of breast cancer    Ureteral cancer, left (HCC) 11/08/2022   Left renal mass 09/06/2022   History of pericarditis 04/26/2019   History of ST elevation  myocardial infarction (STEMI) 09/05/2018   Unstable angina (HCC) 09/04/2018   Presence of drug coated stent in ramus intermedius coronary artery 05/12/2018   COPD with acute exacerbation (HCC) 01/21/2018   Multivessel CAD - CTO dLAD, mCx. DES PCI RI, PTCA of RPDA 01/21/2018   Stable angina (HCC) 01/20/2018   Depression 01/20/2018   Essential hypertension 01/20/2018   Sleep apnea 10/06/2016   Tobacco use disorder 12/19/2015   Type 2 diabetes mellitus with circulatory disorder, without long-term current use of insulin  (HCC) 12/19/2015   Hyperlipidemia with target LDL less than 70 12/19/2015   Bladder cancer (HCC) 12/19/2015   Esophageal reflux 12/19/2015   Essential tremor 12/19/2015   History of stroke 12/19/2015    PCP: Authur Leghorn, MD   REFERRING PROVIDER: Authur Leghorn, MD   REFERRING DIAG: poor balance  THERAPY DIAG:  Unsteadiness on feet  Muscle weakness (generalized)  Rationale for Evaluation and Treatment: Rehabilitation  ONSET DATE: Winter 2024  SUBJECTIVE:   SUBJECTIVE STATEMENT: He feels like he has gotten stronger, but he is still unsure about his balance.   PERTINENT HISTORY: Type 2 diabetes, angina, hypertension, COPD, history of cancer, history of a stroke, history of a myocardial infarction, tremors, arthritis, and depression PAIN:  Are you having pain? Yes: NPRS scale: no pain score provided  Pain location: back, shoulders, and knees Pain description: aching, sore   PRECAUTIONS: Fall  RED FLAGS: None   WEIGHT BEARING RESTRICTIONS: No  FALLS:  Has patient fallen in last 6 months? Yes. Number of falls 2  LIVING ENVIRONMENT: Lives with: lives alone Lives in: House/apartment Stairs: Yes: Internal: 1 steps; none Has following equipment at home: Ramped entry  OCCUPATION: retired  PLOF: Independent with basic ADLs  PATIENT GOALS: improved safety, be able to use his chainsaw, improved strength, and be able to pick up things off the  floor  NEXT MD VISIT: unsure  OBJECTIVE:  Note: Objective measures were completed at Evaluation unless otherwise noted.  COGNITION: Overall cognitive status: Within functional limits for tasks assessed     SENSATION: Light touch: no significant deficits noted Patient reports numbness in both legs with the left being worse and more frequent than the right  LOWER EXTREMITY ROM: WFL for activities assessed  LOWER EXTREMITY MMT:  MMT Right eval Left eval  Hip flexion 4-/5; hip pain 3+/5  Hip extension    Hip abduction    Hip adduction    Hip internal rotation  Hip external rotation    Knee flexion 4/5 5/5  Knee extension 3+/5 5/5  Ankle dorsiflexion 3/5 3/5  Ankle plantarflexion    Ankle inversion    Ankle eversion     (Blank rows = not tested)  FUNCTIONAL TESTS:  5 times sit to stand: 1:05 with BUE support from armrests Timed up and go (TUG): 24.17 seconds 30 second sit to stand: 3 reps  GAIT: Assistive device utilized: None Level of assistance: Complete Independence Comments: Decreased gait speed with poor lateral stability and foot clearance bilaterally                                                                                                                                TREATMENT DATE:                                     10/28/23 EXERCISE LOG  Exercise Repetitions and Resistance Comments  Nustep  L5 x 17 minutes   Static stance on foam 4.5 minutes Intermittent upper extremity support with dual tasking  Marching on foam 4 minutes With bilateral upper extremity support from parallel bars  Tandem walking on foam 4 minutes With bilateral upper extremity support from parallel bars  Sidestepping on foam 4 minutes Intermittent upper extremity support from parallel bars   Blank cell = exercise not performed today   10/25/23                                 EXERCISE LOG  Exercise Repetitions and Resistance Comments  Nustep  Lvl 5 x 15 mins   Cybex Knee  Flexion 30# x 3.5 mins   Cybex Knee Extensions 10# x 3.5 mins   Cybex Leg Press 2 plates; seat 8; 3.5 mins   Rockerboard 4 mins   Side-stepping Airex x 4 mins   Tandem Gait Airex x 4 mins    Blank cell = exercise not performed today                                    10/21/23 EXERCISE LOG  Exercise Repetitions and Resistance Comments  Nustep  L5 x 17 minutes   Goal assessment See goal section    LAQ 5# x 30 reps each    Rocker board  5 minutes   Standing hip extension  3 minutes  With bent knee; alternating LE   Blank cell = exercise not performed today   PATIENT EDUCATION:  Education details: safety and exercising Person educated: Patient Education method: Explanation Education comprehension: verbalized understanding  HOME EXERCISE PROGRAM:   ASSESSMENT:  CLINICAL IMPRESSION:  Today's treatment focused on improved static and dynamic stability needed to safely navigate his home and community.  He  required upper extremity support from the parallel bars with today's dynamic interventions.  However, he was able to complete today static interventions with occasional upper extremity support.  He reported feeling alright upon conclusion of treatment.  His progress with skilled physical therapy will be assessed at his next appointment and his plan of care will be updated as needed.  OBJECTIVE IMPAIRMENTS: Abnormal gait, decreased activity tolerance, decreased balance, decreased mobility, difficulty walking, decreased strength, and pain.   ACTIVITY LIMITATIONS: carrying, lifting, bending, standing, stairs, transfers, bathing, and locomotion level  PARTICIPATION LIMITATIONS: meal prep, cleaning, laundry, shopping, community activity, and yard work  PERSONAL FACTORS: Age, Past/current experiences, Time since onset of injury/illness/exacerbation, and 3+ comorbidities: Type 2 diabetes, angina, hypertension, COPD, history of cancer, history of a stroke, history of a myocardial infarction,  tremors, arthritis, and depression are also affecting patient's functional outcome.   REHAB POTENTIAL: Fair    CLINICAL DECISION MAKING: Evolving/moderate complexity  EVALUATION COMPLEXITY: Moderate   GOALS: Goals reviewed with patient? Yes  SHORT TERM GOALS: Target date: 10/03/23 Patient will be independent with his initial HEP. Baseline: Goal status: MET  2.  Patient will improve his timed up and go time to 19 seconds or less for improved functional mobility. Baseline: 5/16: 18.6 seconds Goal status: MET  3.  Patient will improve his 5 times sit to stand time to 45 seconds or less for improved lower extremity power. Baseline: 5/16: 42.6 seconds Goal status: MET  LONG TERM GOALS: Target date: 10/24/23  Patient will be independent with his advanced HEP. Baseline: "I have not been doing my exercises much" Goal status: IN PROGRESS  2.  Patient will be able to safely pick up items from the floor for improved household independence. Baseline:  Goal status: MET  3.  Patient will improve his 5 times sit to stand time to 30 seconds or less for improved lower extremity power. Baseline: 47.02 seconds on 10/21/23 Goal status: IN PROGRESS  4.  Patient will improve his timed up and go time to 15 seconds or less to reduce his fall risk. Baseline: 26.82 seconds on 10/21/23 Goal status: IN PROGRESS  5.  Patient will improve his 30 second sit to stand test to at least 5 repetitions for improved lower extremity power needed for transfers. Baseline: 3 reps on 10/21/23 Goal status: IN PROGRESS  PLAN:  PT FREQUENCY: 2x/week  PT DURATION: 6 weeks  PLANNED INTERVENTIONS: 97164- PT Re-evaluation, 97750- Physical Performance Testing, 97110-Therapeutic exercises, 97530- Therapeutic activity, 97112- Neuromuscular re-education, 97535- Self Care, 82956- Manual therapy, [phone]- Gait training, Patient/Family education, Balance training, and Stair training  PLAN FOR NEXT SESSION: Nustep, lower  extremity strengthening, balance interventions, and gait training   Lane Pinon, PT 10/28/2023, 1:13 PM

## 2023-11-01 ENCOUNTER — Ambulatory Visit: Admitting: *Deleted

## 2023-11-01 DIAGNOSIS — R2681 Unsteadiness on feet: Secondary | ICD-10-CM | POA: Diagnosis not present

## 2023-11-01 DIAGNOSIS — M6281 Muscle weakness (generalized): Secondary | ICD-10-CM

## 2023-11-01 NOTE — Therapy (Addendum)
 OUTPATIENT PHYSICAL THERAPY LOWER EXTREMITY TREATMENT   Patient Name: Andrew [name] MRN: [id number] DOB:[birth year], 73 y.o., male Today's Date: 11/01/2023  END OF SESSION:  PT End of Session - 11/01/23 1123     Visit Number 12    Number of Visits 12    Date for PT Re-Evaluation 11/18/23    PT Start Time 1100    PT Stop Time 1152    PT Time Calculation (min) 52 min                 Past Medical History:  Diagnosis Date   Acute viral pericarditis 09/03/2018   Inferior STE - but Negative Troponin.  CP &SOB.  Felt to be Viral.   Anemia    Arthritis    Bladder cancer (HCC) 2014   CAD (coronary artery disease)    Cataract    bilateral   Complication of anesthesia    Depression    Diabetes mellitus without complication (HCC)    Essential hypertension 01/20/2018   in the past no longer on medication   Family history of breast cancer    Family history of prostate cancer    Family history of testicular cancer    GERD (gastroesophageal reflux disease)    Heart attack (HCC) 03/2022   Hx of adenomatous colonic polyps 09/2017   colonoscopy   Hyperlipidemia with target LDL less than 70 12/19/2015   Intraoperative floppy iris syndrome (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 DES PCI RI, PTCA of RPDA 01/21/2018   12/2017 - Cath for ? STEMI -> distal/apical LAD & m-dCx CTO (unable to cross Cx).  Mod rPDA. Severe RI - DES PCI.  08/2018 - Cath for ? Inf STEMI - RI stent patent & CTO dLAD/mCx. Progression of rPDA 95% -> PTCA only.  Thought to be Pericarditis & not MI (troponin negative).    Neuromuscular disorder (HCC)    Polyposis of colon 01/25/2023   Sleep apnea    BiPAP not currently being used   Status post tendon repair 1989   STEMI (ST elevation myocardial infarction) (HCC) 01/20/2018   Cardiac cath January 21, 2018: Severe multivessel disease with unclear lesion, but opted for PCI/DES x1 to the RI.  Appeared to have CTO of the distal LAD  and circumflex as well as moderate mid LAD and diagonal disease as well as PDA.[name]  Unable to cross circumflex lesion.   STEMI (ST elevation myocardial infarction) (HCC) 03/2022   Stroke (HCC) 11/2015   At times pt has dizziness with loss of vision   Tremors of nervous system 2011   Past Surgical History:  Procedure Laterality Date   APPENDECTOMY     BLADDER SURGERY     COLONOSCOPY     CORONARY STENT INTERVENTION N/A 01/21/2018   Procedure: CORONARY STENT INTERVENTION;  Surgeon: Arleen Lacer, MD;  Location: [facility] INVASIVE CV LAB;  Service: Cardiovascular;  Laterality: N/A;  95% Ramus Intermedius -PCI with synergy DES 2.25 mm x 12 mm postdilated 2.4 mm.   CORONARY/GRAFT ACUTE MI REVASCULARIZATION N/A 01/21/2018   Procedure: Coronary/Graft Acute MI Revascularization;  Surgeon: Arleen Lacer, MD;  Location: [facility] INVASIVE CV LAB;  Service: Cardiovascular;  Laterality: N/A;  attempted revas to distal CFX;    CORONARY/GRAFT ACUTE MI REVASCULARIZATION N/A 09/04/2018   Procedure: Coronary/Graft Acute MI Revascularization;  Surgeon: Arnoldo Lapping, MD;  Location: [facility] INVASIVE CV LAB:: PTCA/POBA of rPDA (2.0 mm balloon)   CYSTOSCOPY N/A  01/21/2020   Procedure: CYSTOSCOPY;  Surgeon: Marco Severs, MD;  Location: [facility];  Service: Urology;  Laterality: N/A;   CYSTOSCOPY N/A 02/09/2021   Procedure: CYSTOSCOPY;  Surgeon: Marco Severs, MD;  Location: [facility];  Service: Urology;  Laterality: N/A;   CYSTOSCOPY Left 11/08/2022   Procedure: CYSTOSCOPY;  Surgeon: Marco Severs, MD;  Location: [facility];  Service: Urology;  Laterality: Left;   CYSTOSCOPY W/ RETROGRADES Bilateral 08/22/2015   Procedure: CYSTOSCOPY WITH RETROGRADE PYELOGRAM;  Surgeon: Homero Luster, MD;  Location: [facility];  Service: Urology;  Laterality: Bilateral;   CYSTOSCOPY W/ RETROGRADES Bilateral 01/16/2016   Procedure: CYSTOSCOPY WITH RETROGRADE PYELOGRAM;  Surgeon: Homero Luster, MD;  Location: [facility];  Service: Urology;  Laterality:  Bilateral;   CYSTOSCOPY W/ RETROGRADES Bilateral 01/07/2017   Procedure: CYSTOSCOPY WITH RETROGRADE PYELOGRAM;  Surgeon: Homero Luster, MD;  Location: [facility];  Service: Urology;  Laterality: Bilateral;   CYSTOSCOPY WITH BIOPSY N/A 08/22/2015   Procedure: CYSTOSCOPY WITH BLADDER BIOPSY;  Surgeon: Homero Luster, MD;  Location: [facility];  Service: Urology;  Laterality: N/A;   CYSTOSCOPY WITH BIOPSY N/A 01/16/2016   Procedure: CYSTOSCOPY WITH BLADDER BIOPSY;  Surgeon: Homero Luster, MD;  Location: [facility];  Service: Urology;  Laterality: N/A;   CYSTOSCOPY WITH FULGERATION N/A 01/16/2016   Procedure: CYSTOSCOPY WITH FULGERATION;  Surgeon: Homero Luster, MD;  Location: [facility];  Service: Urology;  Laterality: N/A;   CYSTOSCOPY/RETROGRADE/URETEROSCOPY Left 09/06/2022   Procedure: CYSTOSCOPY/RETROGRADE/URETEROSCOPY/STENT PLACEMENT;  Surgeon: Marco Severs, MD;  Location: [facility];  Service: Urology;  Laterality: Left;   KNEE ARTHROSCOPY Right 1989   LEFT HEART CATH AND CORONARY ANGIOGRAPHY N/A 01/21/2018   Procedure: LEFT HEART CATH AND CORONARY ANGIOGRAPHY;  Surgeon: Arleen Lacer, MD;  Location: [facility] INVASIVE CV LAB;  Service: Cardiovascular;; 95% RI-DES PCI.  80% o-pCX (PTCA) -> dCX 80%-100% CTO (unsuccessful PTCA unable to cross distal lesion with balloon.)  100% CTO dLAD. RPDA 80% and 70% as well as P AV 180%, PA V2 60%.  (too small for PCI)   LEFT HEART CATH AND CORONARY ANGIOGRAPHY N/A 09/04/2018   Procedure: LEFT HEART CATH AND CORONARY ANGIOGRAPHY;  Surgeon: Arnoldo Lapping, MD;  Location: [facility] INVASIVE CV LAB: (presumed Inferior STEMI) = progression of small rPDA -95% (PTCA only). Patent RI stent. CTO of apical LAD & mCx.   ROBOT ASSITED LAPAROSCOPIC NEPHROURETERECTOMY Left 11/08/2022   Procedure: XI ROBOT ASSITED LAPAROSCOPIC NEPHROURETERECTOMY;  Surgeon: Marco Severs, MD;  Location: [facility];  Service: Urology;  Laterality: Left;   ROTATOR CUFF REPAIR Bilateral 2000   rt. leg fracture surgery      TRANSTHORACIC ECHOCARDIOGRAM  01/21/2018   Mild LVH.  EF 50 and 55%.  Apical inferior hypokinesis.  Mid-apical anterolateral hypokinesis.  Normal diastolic function for age    TRANSTHORACIC ECHOCARDIOGRAM  09/04/2018   -? Inf STEMI vs. Pericarditis:  Mildly reduced EF of 45 to 50%.  Impaired relaxation-GR 1 DD.  Severe hypokinesis of the anterolateral and inferolateral wall.  Small-moderate anterior pericardial effusion.  Moderate aortic sclerosis but no stenosis.   TRANSURETHRAL RESECTION OF BLADDER TUMOR N/A 01/07/2017   Procedure: TRANSURETHRAL RESECTION OF BLADDER TUMOR (TURBT);  Surgeon: Homero Luster, MD;  Location: [facility];  Service: Urology;  Laterality: N/A;   TRANSURETHRAL RESECTION OF BLADDER TUMOR N/A 01/21/2020   Procedure: TRANSURETHRAL RESECTION OF BLADDER TUMOR (TURBT);  Surgeon: Marco Severs, MD;  Location: [facility];  Service: Urology;  Laterality: N/A;   TRANSURETHRAL RESECTION OF  BLADDER TUMOR N/A 02/09/2021   Procedure: TRANSURETHRAL RESECTION OF BLADDER TUMOR (TURBT);  Surgeon: Marco Severs, MD;  Location: [facility];  Service: Urology;  Laterality: N/A;   URETERAL BIOPSY Left 09/06/2022   Procedure: URETERAL BIOPSY/fulgeration;  Surgeon: Marco Severs, MD;  Location: [facility];  Service: Urology;  Laterality: Left;   WISDOM TOOTH EXTRACTION     Patient Active Problem List   Diagnosis Date Noted   Epidermoid metaplasia esophagus 07/19/2023   Barrett's esophagus without dysplasia 07/19/2023   Abnormal endoscopy of upper gastrointestinal tract 07/19/2023   Genetic testing 02/10/2023   Polyposis of colon 01/25/2023   Family history of prostate cancer    Family history of testicular cancer    Family history of breast cancer    Ureteral cancer, 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 DES PCI RI, PTCA of RPDA 01/21/2018   Stable angina (HCC) 01/20/2018   Depression 01/20/2018   Essential hypertension 01/20/2018   Sleep apnea 10/06/2016   Tobacco use disorder 12/19/2015   Type 2 diabetes mellitus with circulatory disorder, without long-term current use of insulin  (HCC) 12/19/2015   Hyperlipidemia with target LDL less than 70 12/19/2015   Bladder cancer (HCC) 12/19/2015   Esophageal reflux 12/19/2015   Essential tremor 12/19/2015   History of stroke 12/19/2015    PCP: Authur Leghorn, MD   REFERRING PROVIDER: Authur Leghorn, MD   REFERRING DIAG: poor balance  THERAPY DIAG:  Unsteadiness on feet  Muscle weakness (generalized)  Rationale for Evaluation and Treatment: Rehabilitation  ONSET DATE: Winter 2024  SUBJECTIVE:   SUBJECTIVE STATEMENT: He feels like he has gotten stronger, but he is still unsure about his balance and wants to come for more PT.   PERTINENT HISTORY: Type 2 diabetes, angina, hypertension, COPD, history of cancer, history of a stroke, history of a myocardial infarction, tremors, arthritis, and depression PAIN:  Are you having pain? Yes: NPRS scale: no pain score provided  Pain location: back, shoulders, and knees Pain description: aching, sore   PRECAUTIONS: Fall  RED FLAGS: None   WEIGHT BEARING RESTRICTIONS: No  FALLS:  Has patient fallen in last 6 months? Yes. Number of falls 2  LIVING ENVIRONMENT: Lives with: lives alone Lives in: House/apartment Stairs: Yes: Internal: 1 steps; none Has following equipment at home: Ramped entry  OCCUPATION: retired  PLOF: Independent with basic ADLs  PATIENT GOALS: improved safety, be able to use his chainsaw, improved strength, and be able to pick up things off the floor  NEXT MD VISIT: unsure  OBJECTIVE:  Note: Objective measures were  completed at Evaluation unless otherwise noted.  COGNITION: Overall cognitive status: Within functional limits for tasks assessed     SENSATION: Light touch: no significant deficits noted Patient reports numbness in both legs with the left being worse and more frequent than the right  LOWER EXTREMITY ROM: WFL for activities assessed  LOWER EXTREMITY MMT:  MMT Right eval Left eval  Hip flexion 4-/5; hip pain 3+/5  Hip extension    Hip abduction    Hip adduction    Hip internal rotation    Hip external rotation    Knee flexion 4/5 5/5  Knee extension 3+/5 5/5  Ankle dorsiflexion 3/5 3/5  Ankle plantarflexion    Ankle inversion    Ankle eversion     (Blank rows = not tested)  FUNCTIONAL TESTS:  5 times sit to stand: 1:05 with BUE support from armrests Timed up and go (TUG): 24.17 seconds 30 second sit to stand: 3 reps  GAIT: Assistive device utilized: None Level of assistance: Complete Independence Comments: Decreased gait speed with poor lateral stability and foot clearance bilaterally                                                                                                                                TREATMENT DATE:                                     11/01/23 EXERCISE LOG  Exercise Repetitions and Resistance Comments  Nustep  L5 x 17 minutes   Static stance on foam 4.5 minutes Intermittent upper extremity support with dual tasking  Marching on foam 4 minutes With bilateral upper extremity support from parallel bars  Tandem walking on foam 4 minutes With bilateral upper extremity support from parallel bars  Sidestepping on foam 4 minutes Intermittent upper extremity support from parallel bars  Rocker board X 4 mins    Blank cell = exercise not performed today  SIT to stand   40.31 secs TUG   22.89 secs 10/25/23                                 EXERCISE LOG  Exercise Repetitions and Resistance Comments  Nustep  Lvl 5 x 15 mins   Cybex Knee Flexion 30# x  3.5 mins   Cybex Knee Extensions 10# x 3.5 mins   Cybex Leg Press 2 plates; seat 8; 3.5 mins   Rockerboard 4 mins   Side-stepping Airex x 4 mins   Tandem Gait Airex x 4 mins    Blank cell = exercise not performed today                                    10/21/23 EXERCISE LOG  Exercise Repetitions and Resistance Comments  Nustep  L5 x 17 minutes   Goal assessment See goal section    LAQ 5# x 30 reps each    Rocker board  5 minutes   Standing hip extension  3 minutes  With bent knee; alternating LE   Blank cell = exercise not performed today   PATIENT EDUCATION:  Education details: safety and exercising Person educated: Patient Education method: Explanation Education comprehension: verbalized understanding  HOME EXERCISE PROGRAM:   ASSESSMENT:  CLINICAL IMPRESSION:  Pt arrived today feeling tired, but good. He was able continue with  therex and balance act.'s and did very well with progressions towards goals. He was able to decrease times for STS and TUG test today but NM yet. Recert for 6 more visits   OBJECTIVE IMPAIRMENTS: Abnormal gait, decreased activity tolerance, decreased balance, decreased mobility, difficulty walking, decreased strength, and pain.   ACTIVITY LIMITATIONS: carrying, lifting, bending, standing, stairs, transfers, bathing, and locomotion level  PARTICIPATION LIMITATIONS: meal prep, cleaning, laundry, shopping, community activity, and yard work  PERSONAL FACTORS: Age, Past/current experiences, Time since onset of injury/illness/exacerbation, and 3+ comorbidities: Type 2 diabetes, angina, hypertension, COPD, history of cancer, history of a stroke, history of a myocardial infarction, tremors, arthritis, and depression are also affecting patient's functional outcome.   REHAB POTENTIAL: Fair    CLINICAL DECISION MAKING: Evolving/moderate complexity  EVALUATION COMPLEXITY: Moderate   GOALS: Goals reviewed with patient? Yes  SHORT TERM GOALS: Target  date: 10/03/23 Patient will be independent with his initial HEP. Baseline: Goal status: MET  2.  Patient will improve his timed up and go time to 19 seconds or less for improved functional mobility. Baseline: 5/16: 18.6 seconds Goal status: MET  3.  Patient will improve his 5 times sit to stand time to 45 seconds or less for improved lower extremity power. Baseline: 5/16: 42.6 seconds Goal status: MET  LONG TERM GOALS: Target date: 10/24/23  Patient will be independent with his advanced HEP. Baseline: "I have not been doing my exercises much" Goal status: IN PROGRESS  2.  Patient will be able to safely pick up items from the floor for improved household independence. Baseline:  Goal status: MET  3.  Patient will improve his 5 times sit to stand time to 30 seconds or less for improved lower extremity power. Baseline: 47.02 seconds on 10/21/23    11-01-23  40.31 secs Goal status: In Progress    4.  Patient will improve his timed up and go time to 15 seconds or less to reduce his fall risk. Baseline: 26.82 seconds on 10/21/23   11-01-23  22.89 secs Goal status: IN PROGRESS  5.  Patient will improve his 30 second sit to stand test to at least 5 repetitions for improved lower extremity power needed for transfers. Baseline: 3 reps on 10/21/23 Goal status: IN PROGRESS  PLAN:  PT FREQUENCY: 2x/week  PT DURATION: 6 weeks  PLANNED INTERVENTIONS: 97164- PT Re-evaluation, 97750- Physical Performance Testing, 97110-Therapeutic exercises, 97530- Therapeutic activity, 97112- Neuromuscular re-education, 97535- Self Care, 91478- Manual therapy, [phone]- Gait training, Patient/Family education, Balance training, and Stair training  PLAN FOR NEXT SESSION:    Recert 6 more visits  Nustep, lower extremity strengthening, balance interventions, and gait training   Avarey Yaeger,CHRIS, PTA 11/01/2023, 12:12 PM

## 2023-11-01 NOTE — Addendum Note (Signed)
 Addended by: Judea Fennimore, [place] W on: 11/01/2023 01:03 PM   Modules accepted: Orders

## 2023-11-08 ENCOUNTER — Ambulatory Visit

## 2023-11-08 DIAGNOSIS — M6281 Muscle weakness (generalized): Secondary | ICD-10-CM

## 2023-11-08 DIAGNOSIS — R2681 Unsteadiness on feet: Secondary | ICD-10-CM

## 2023-11-08 NOTE — Therapy (Signed)
 OUTPATIENT PHYSICAL THERAPY LOWER EXTREMITY TREATMENT   Patient Name: Andrew [name] MRN: [id number] DOB:1950/10/28, 73 y.o., male Today's Date: 11/08/2023  END OF SESSION:  PT End of Session - 11/08/23 1526     Visit Number 13    Number of Visits 18    Date for PT Re-Evaluation 12/02/23    PT Start Time 1518    PT Stop Time 1600    PT Time Calculation (min) 42 min    Activity Tolerance Patient tolerated treatment well    Behavior During Therapy [facility] for tasks assessed/performed               Past Medical History:  Diagnosis Date   Acute viral pericarditis 09/03/2018   Inferior STE - but Negative Troponin.  CP &SOB.  Felt to be Viral.   Anemia    Arthritis    Bladder cancer (HCC) 2014   CAD (coronary artery disease)    Cataract    bilateral   Complication of anesthesia    Depression    Diabetes mellitus without complication (HCC)    Essential hypertension 01/20/2018   in the past no longer on medication   Family history of breast cancer    Family history of prostate cancer    Family history of testicular cancer    GERD (gastroesophageal reflux disease)    Heart attack (HCC) 03/2022   Hx of adenomatous colonic polyps 09/2017   colonoscopy   Hyperlipidemia with target LDL less than 70 12/19/2015   Intraoperative floppy iris syndrome (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 DES PCI RI, PTCA of RPDA 01/21/2018   12/2017 - Cath for ? STEMI -> distal/apical LAD & m-dCx CTO (unable to cross Cx).  Mod rPDA. Severe RI - DES PCI.  08/2018 - Cath for ? Inf STEMI - RI stent patent & CTO dLAD/mCx. Progression of rPDA 95% -> PTCA only.  Thought to be Pericarditis & not MI (troponin negative).    Neuromuscular disorder (HCC)    Polyposis of colon 01/25/2023   Sleep apnea    BiPAP not currently being used   Status post tendon repair 1989   STEMI (ST elevation myocardial infarction) (HCC) 01/20/2018   Cardiac cath January 21, 2018: Severe  multivessel disease with unclear lesion, but opted for PCI/DES x1 to the RI.  Appeared to have CTO of the distal LAD and circumflex as well as moderate mid LAD and diagonal disease as well as PDA.[name]  Unable to cross circumflex lesion.   STEMI (ST elevation myocardial infarction) (HCC) 03/2022   Stroke (HCC) 11/2015   At times pt has dizziness with loss of vision   Tremors of nervous system 2011   Past Surgical History:  Procedure Laterality Date   APPENDECTOMY     BLADDER SURGERY     COLONOSCOPY     CORONARY STENT INTERVENTION N/A 01/21/2018   Procedure: CORONARY STENT INTERVENTION;  Surgeon: Arleen Lacer, MD;  Location: [facility] INVASIVE CV LAB;  Service: Cardiovascular;  Laterality: N/A;  95% Ramus Intermedius -PCI with synergy DES 2.25 mm x 12 mm postdilated 2.4 mm.   CORONARY/GRAFT ACUTE MI REVASCULARIZATION N/A 01/21/2018   Procedure: Coronary/Graft Acute MI Revascularization;  Surgeon: Arleen Lacer, MD;  Location: [facility] INVASIVE CV LAB;  Service: Cardiovascular;  Laterality: N/A;  attempted revas to distal CFX;    CORONARY/GRAFT ACUTE MI REVASCULARIZATION N/A 09/04/2018   Procedure: Coronary/Graft Acute MI Revascularization;  Surgeon: Arnoldo Lapping,  MD;  Location: [facility] INVASIVE CV LAB:: PTCA/POBA of rPDA (2.0 mm balloon)   CYSTOSCOPY N/A 01/21/2020   Procedure: CYSTOSCOPY;  Surgeon: Marco Severs, MD;  Location: [facility];  Service: Urology;  Laterality: N/A;   CYSTOSCOPY N/A 02/09/2021   Procedure: CYSTOSCOPY;  Surgeon: Marco Severs, MD;  Location: [facility];  Service: Urology;  Laterality: N/A;   CYSTOSCOPY Left 11/08/2022   Procedure: CYSTOSCOPY;  Surgeon: Marco Severs, MD;  Location: [facility];  Service: Urology;  Laterality: Left;   CYSTOSCOPY W/ RETROGRADES Bilateral 08/22/2015   Procedure: CYSTOSCOPY WITH RETROGRADE PYELOGRAM;  Surgeon: Homero Luster, MD;  Location: [facility];  Service: Urology;  Laterality: Bilateral;   CYSTOSCOPY W/ RETROGRADES Bilateral 01/16/2016   Procedure:  CYSTOSCOPY WITH RETROGRADE PYELOGRAM;  Surgeon: Homero Luster, MD;  Location: [facility];  Service: Urology;  Laterality: Bilateral;   CYSTOSCOPY W/ RETROGRADES Bilateral 01/07/2017   Procedure: CYSTOSCOPY WITH RETROGRADE PYELOGRAM;  Surgeon: Homero Luster, MD;  Location: [facility];  Service: Urology;  Laterality: Bilateral;   CYSTOSCOPY WITH BIOPSY N/A 08/22/2015   Procedure: CYSTOSCOPY WITH BLADDER BIOPSY;  Surgeon: Homero Luster, MD;  Location: [facility];  Service: Urology;  Laterality: N/A;   CYSTOSCOPY WITH BIOPSY N/A 01/16/2016   Procedure: CYSTOSCOPY WITH BLADDER BIOPSY;  Surgeon: Homero Luster, MD;  Location: [facility];  Service: Urology;  Laterality: N/A;   CYSTOSCOPY WITH FULGERATION N/A 01/16/2016   Procedure: CYSTOSCOPY WITH FULGERATION;  Surgeon: Homero Luster, MD;  Location: [facility];  Service: Urology;  Laterality: N/A;   CYSTOSCOPY/RETROGRADE/URETEROSCOPY Left 09/06/2022   Procedure: CYSTOSCOPY/RETROGRADE/URETEROSCOPY/STENT PLACEMENT;  Surgeon: Marco Severs, MD;  Location: [facility];  Service: Urology;  Laterality: Left;   KNEE ARTHROSCOPY Right 1989   LEFT HEART CATH AND CORONARY ANGIOGRAPHY N/A 01/21/2018   Procedure: LEFT HEART CATH AND CORONARY ANGIOGRAPHY;  Surgeon: Arleen Lacer, MD;  Location: [facility] INVASIVE CV LAB;  Service: Cardiovascular;; 95% RI-DES PCI.  80% o-pCX (PTCA) -> dCX 80%-100% CTO (unsuccessful PTCA unable to cross distal lesion with balloon.)  100% CTO dLAD. RPDA 80% and 70% as well as P AV 180%, PA V2 60%.  (too small for PCI)   LEFT HEART CATH AND CORONARY ANGIOGRAPHY N/A 09/04/2018   Procedure: LEFT HEART CATH AND CORONARY ANGIOGRAPHY;  Surgeon: Arnoldo Lapping, MD;  Location: [facility] INVASIVE CV LAB: (presumed Inferior STEMI) = progression of small rPDA -95% (PTCA only). Patent RI stent. CTO of apical LAD & mCx.   ROBOT ASSITED LAPAROSCOPIC NEPHROURETERECTOMY Left 11/08/2022   Procedure: XI ROBOT ASSITED LAPAROSCOPIC NEPHROURETERECTOMY;  Surgeon: Marco Severs, MD;  Location: [facility];   Service: Urology;  Laterality: Left;   ROTATOR CUFF REPAIR Bilateral 2000   rt. leg fracture surgery     TRANSTHORACIC ECHOCARDIOGRAM  01/21/2018   Mild LVH.  EF 50 and 55%.  Apical inferior hypokinesis.  Mid-apical anterolateral hypokinesis.  Normal diastolic function for age    TRANSTHORACIC ECHOCARDIOGRAM  09/04/2018   -? Inf STEMI vs. Pericarditis:  Mildly reduced EF of 45 to 50%.  Impaired relaxation-GR 1 DD.  Severe hypokinesis of the anterolateral and inferolateral wall.  Small-moderate anterior pericardial effusion.  Moderate aortic sclerosis but no stenosis.   TRANSURETHRAL RESECTION OF BLADDER TUMOR N/A 01/07/2017   Procedure: TRANSURETHRAL RESECTION OF BLADDER TUMOR (TURBT);  Surgeon: Homero Luster, MD;  Location: [facility];  Service: Urology;  Laterality: N/A;   TRANSURETHRAL RESECTION OF BLADDER TUMOR N/A 01/21/2020   Procedure: TRANSURETHRAL RESECTION OF BLADDER TUMOR (TURBT);  Surgeon: Johnie Nailer  L, MD;  Location: [facility];  Service: Urology;  Laterality: N/A;   TRANSURETHRAL RESECTION OF BLADDER TUMOR N/A 02/09/2021   Procedure: TRANSURETHRAL RESECTION OF BLADDER TUMOR (TURBT);  Surgeon: Marco Severs, MD;  Location: [facility];  Service: Urology;  Laterality: N/A;   URETERAL BIOPSY Left 09/06/2022   Procedure: URETERAL BIOPSY/fulgeration;  Surgeon: Marco Severs, MD;  Location: [facility];  Service: Urology;  Laterality: Left;   WISDOM TOOTH EXTRACTION     Patient Active Problem List   Diagnosis Date Noted   Epidermoid metaplasia esophagus 07/19/2023   Barrett's esophagus without dysplasia 07/19/2023   Abnormal endoscopy of upper gastrointestinal tract 07/19/2023   Genetic testing 02/10/2023   Polyposis of colon 01/25/2023   Family history of prostate cancer    Family history of testicular cancer    Family history of breast cancer    Ureteral cancer, left (HCC) 11/08/2022   Left renal mass 09/06/2022   History of pericarditis 04/26/2019   History of ST elevation  myocardial infarction (STEMI) 09/05/2018   Unstable angina (HCC) 09/04/2018   Presence of drug coated stent in ramus intermedius coronary artery 05/12/2018   COPD with acute exacerbation (HCC) 01/21/2018   Multivessel CAD - CTO dLAD, mCx. DES PCI RI, PTCA of RPDA 01/21/2018   Stable angina (HCC) 01/20/2018   Depression 01/20/2018   Essential hypertension 01/20/2018   Sleep apnea 10/06/2016   Tobacco use disorder 12/19/2015   Type 2 diabetes mellitus with circulatory disorder, without long-term current use of insulin  (HCC) 12/19/2015   Hyperlipidemia with target LDL less than 70 12/19/2015   Bladder cancer (HCC) 12/19/2015   Esophageal reflux 12/19/2015   Essential tremor 12/19/2015   History of stroke 12/19/2015    PCP: Authur Leghorn, MD   REFERRING PROVIDER: Authur Leghorn, MD   REFERRING DIAG: poor balance  THERAPY DIAG:  Unsteadiness on feet  Muscle weakness (generalized)  Rationale for Evaluation and Treatment: Rehabilitation  ONSET DATE: Winter 2024  SUBJECTIVE:   SUBJECTIVE STATEMENT: Patient reports that he feels alright today. He has not had any problems since his last appointment.   PERTINENT HISTORY: Type 2 diabetes, angina, hypertension, COPD, history of cancer, history of a stroke, history of a myocardial infarction, tremors, arthritis, and depression PAIN:  Are you having pain? Yes: NPRS scale: no pain score provided  Pain location: back, shoulders, and knees Pain description: aching, sore   PRECAUTIONS: Fall  RED FLAGS: None   WEIGHT BEARING RESTRICTIONS: No  FALLS:  Has patient fallen in last 6 months? Yes. Number of falls 2  LIVING ENVIRONMENT: Lives with: lives alone Lives in: House/apartment Stairs: Yes: Internal: 1 steps; none Has following equipment at home: Ramped entry  OCCUPATION: retired  PLOF: Independent with basic ADLs  PATIENT GOALS: improved safety, be able to use his chainsaw, improved strength, and be able to  pick up things off the floor  NEXT MD VISIT: unsure  OBJECTIVE:  Note: Objective measures were completed at Evaluation unless otherwise noted.  COGNITION: Overall cognitive status: Within functional limits for tasks assessed     SENSATION: Light touch: no significant deficits noted Patient reports numbness in both legs with the left being worse and more frequent than the right  LOWER EXTREMITY ROM: WFL for activities assessed  LOWER EXTREMITY MMT:  MMT Right eval Left eval  Hip flexion 4-/5; hip pain 3+/5  Hip extension    Hip abduction    Hip adduction    Hip internal rotation  Hip external rotation    Knee flexion 4/5 5/5  Knee extension 3+/5 5/5  Ankle dorsiflexion 3/5 3/5  Ankle plantarflexion    Ankle inversion    Ankle eversion     (Blank rows = not tested)  FUNCTIONAL TESTS:  5 times sit to stand: 1:05 with BUE support from armrests Timed up and go (TUG): 24.17 seconds 30 second sit to stand: 3 reps  GAIT: Assistive device utilized: None Level of assistance: Complete Independence Comments: Decreased gait speed with poor lateral stability and foot clearance bilaterally                                                                                                                                TREATMENT DATE:                                     11/08/23 EXERCISE LOG  Exercise Repetitions and Resistance Comments  Nustep  L5 x 17 minutes   Standing hip ABD  2 minutes Alternating LE  Rocker board  5 minutes   Toe taps on step  14 step x 3 minutes  Alternating every 5 reps   Tandem walking on foam  2 minutes With BUE support from parallel bars  Side stepping on foam  2 minutes   Cybex knee flexion 30# x 3 minutes    Cybex knee extension 10# x 2.5 minutes    Blank cell = exercise not performed today                                    11/01/23 EXERCISE LOG  Exercise Repetitions and Resistance Comments  Nustep  L5 x 17 minutes   Static stance on foam  4.5 minutes Intermittent upper extremity support with dual tasking  Marching on foam 4 minutes With bilateral upper extremity support from parallel bars  Tandem walking on foam 4 minutes With bilateral upper extremity support from parallel bars  Sidestepping on foam 4 minutes Intermittent upper extremity support from parallel bars  Rocker board X 4 mins    Blank cell = exercise not performed today  SIT to stand   40.31 secs TUG   22.89 secs 10/25/23                                 EXERCISE LOG  Exercise Repetitions and Resistance Comments  Nustep  Lvl 5 x 15 mins   Cybex Knee Flexion 30# x 3.5 mins   Cybex Knee Extensions 10# x 3.5 mins   Cybex Leg Press 2 plates; seat 8; 3.5 mins   Rockerboard 4 mins   Side-stepping Airex x 4 mins   Tandem Gait Airex x 4 mins    Blank  cell = exercise not performed today   PATIENT EDUCATION:  Education details: safety and exercising Person educated: Patient Education method: Explanation Education comprehension: verbalized understanding  HOME EXERCISE PROGRAM:   ASSESSMENT:  CLINICAL IMPRESSION:  Patient was progressed with familiar interventions for improved lower extremity strength and dynamic stability needed to maximize his functional mobility. He required minimal cueing with standing hip abduction to limit trunk mobility to isolate hip abductor engagement. Fatigue was hi primary limiting factor as he required brief rest breaks throughout treatment. He reported feeling tired upon the conclusion of treatment. He continues to require skilled physical therapy to address his remaining impairments to maximize his safety and functional mobility.   OBJECTIVE IMPAIRMENTS: Abnormal gait, decreased activity tolerance, decreased balance, decreased mobility, difficulty walking, decreased strength, and pain.   ACTIVITY LIMITATIONS: carrying, lifting, bending, standing, stairs, transfers, bathing, and locomotion level  PARTICIPATION LIMITATIONS: meal prep,  cleaning, laundry, shopping, community activity, and yard work  PERSONAL FACTORS: Age, Past/current experiences, Time since onset of injury/illness/exacerbation, and 3+ comorbidities: Type 2 diabetes, angina, hypertension, COPD, history of cancer, history of a stroke, history of a myocardial infarction, tremors, arthritis, and depression are also affecting patient's functional outcome.   REHAB POTENTIAL: Fair    CLINICAL DECISION MAKING: Evolving/moderate complexity  EVALUATION COMPLEXITY: Moderate   GOALS: Goals reviewed with patient? Yes  SHORT TERM GOALS: Target date: 10/03/23 Patient will be independent with his initial HEP. Baseline: Goal status: MET  2.  Patient will improve his timed up and go time to 19 seconds or less for improved functional mobility. Baseline: 5/16: 18.6 seconds Goal status: MET  3.  Patient will improve his 5 times sit to stand time to 45 seconds or less for improved lower extremity power. Baseline: 5/16: 42.6 seconds Goal status: MET  LONG TERM GOALS: Target date: 10/24/23  Patient will be independent with his advanced HEP. Baseline: I have not been doing my exercises much Goal status: IN PROGRESS  2.  Patient will be able to safely pick up items from the floor for improved household independence. Baseline:  Goal status: MET  3.  Patient will improve his 5 times sit to stand time to 30 seconds or less for improved lower extremity power. Baseline: 47.02 seconds on 10/21/23    11-01-23  40.31 secs Goal status: In Progress    4.  Patient will improve his timed up and go time to 15 seconds or less to reduce his fall risk. Baseline: 26.82 seconds on 10/21/23   11-01-23  22.89 secs Goal status: IN PROGRESS  5.  Patient will improve his 30 second sit to stand test to at least 5 repetitions for improved lower extremity power needed for transfers. Baseline: 3 reps on 10/21/23 Goal status: IN PROGRESS  PLAN:  PT FREQUENCY: 2x/week  PT DURATION: 6  weeks  PLANNED INTERVENTIONS: 97164- PT Re-evaluation, 97750- Physical Performance Testing, 97110-Therapeutic exercises, 97530- Therapeutic activity, 97112- Neuromuscular re-education, 97535- Self Care, 16109- Manual therapy, [phone]- Gait training, Patient/Family education, Balance training, and Stair training  PLAN FOR NEXT SESSION:    Recert 6 more visits  Nustep, lower extremity strengthening, balance interventions, and gait training   Lane Pinon, PT 11/08/2023, 5:05 PM

## 2023-11-11 ENCOUNTER — Ambulatory Visit

## 2023-11-11 DIAGNOSIS — R2681 Unsteadiness on feet: Secondary | ICD-10-CM

## 2023-11-11 DIAGNOSIS — M6281 Muscle weakness (generalized): Secondary | ICD-10-CM

## 2023-11-11 NOTE — Therapy (Signed)
 OUTPATIENT PHYSICAL THERAPY LOWER EXTREMITY TREATMENT   Patient Name: Andrew [name] MRN: [id number] DOB:30-Jan-1951, 73 y.o., male Today's Date: 11/11/2023  END OF SESSION:  PT End of Session - 11/11/23 1135     Visit Number 14    Number of Visits 18    Date for PT Re-Evaluation 12/02/23    PT Start Time 1127    PT Stop Time 1230    PT Time Calculation (min) 63 min    Activity Tolerance Patient tolerated treatment well    Behavior During Therapy [facility] for tasks assessed/performed                Past Medical History:  Diagnosis Date   Acute viral pericarditis 09/03/2018   Inferior STE - but Negative Troponin.  CP &SOB.  Felt to be Viral.   Anemia    Arthritis    Bladder cancer (HCC) 2014   CAD (coronary artery disease)    Cataract    bilateral   Complication of anesthesia    Depression    Diabetes mellitus without complication (HCC)    Essential hypertension 01/20/2018   in the past no longer on medication   Family history of breast cancer    Family history of prostate cancer    Family history of testicular cancer    GERD (gastroesophageal reflux disease)    Heart attack (HCC) 03/2022   Hx of adenomatous colonic polyps 09/2017   colonoscopy   Hyperlipidemia with target LDL less than 70 12/19/2015   Intraoperative floppy iris syndrome (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 DES PCI RI, PTCA of RPDA 01/21/2018   12/2017 - Cath for ? STEMI -> distal/apical LAD & m-dCx CTO (unable to cross Cx).  Mod rPDA. Severe RI - DES PCI.  08/2018 - Cath for ? Inf STEMI - RI stent patent & CTO dLAD/mCx. Progression of rPDA 95% -> PTCA only.  Thought to be Pericarditis & not MI (troponin negative).    Neuromuscular disorder (HCC)    Polyposis of colon 01/25/2023   Sleep apnea    BiPAP not currently being used   Status post tendon repair 1989   STEMI (ST elevation myocardial infarction) (HCC) 01/20/2018   Cardiac cath January 21, 2018: Severe  multivessel disease with unclear lesion, but opted for PCI/DES x1 to the RI.  Appeared to have CTO of the distal LAD and circumflex as well as moderate mid LAD and diagonal disease as well as PDA.[name]  Unable to cross circumflex lesion.   STEMI (ST elevation myocardial infarction) (HCC) 03/2022   Stroke (HCC) 11/2015   At times pt has dizziness with loss of vision   Tremors of nervous system 2011   Past Surgical History:  Procedure Laterality Date   APPENDECTOMY     BLADDER SURGERY     COLONOSCOPY     CORONARY STENT INTERVENTION N/A 01/21/2018   Procedure: CORONARY STENT INTERVENTION;  Surgeon: Arleen Lacer, MD;  Location: [facility] INVASIVE CV LAB;  Service: Cardiovascular;  Laterality: N/A;  95% Ramus Intermedius -PCI with synergy DES 2.25 mm x 12 mm postdilated 2.4 mm.   CORONARY/GRAFT ACUTE MI REVASCULARIZATION N/A 01/21/2018   Procedure: Coronary/Graft Acute MI Revascularization;  Surgeon: Arleen Lacer, MD;  Location: [facility] INVASIVE CV LAB;  Service: Cardiovascular;  Laterality: N/A;  attempted revas to distal CFX;    CORONARY/GRAFT ACUTE MI REVASCULARIZATION N/A 09/04/2018   Procedure: Coronary/Graft Acute MI Revascularization;  Surgeon: Arlester Ladd,  Bambi Lever, MD;  Location: [facility] INVASIVE CV LAB:: PTCA/POBA of rPDA (2.0 mm balloon)   CYSTOSCOPY N/A 01/21/2020   Procedure: CYSTOSCOPY;  Surgeon: Marco Severs, MD;  Location: [facility];  Service: Urology;  Laterality: N/A;   CYSTOSCOPY N/A 02/09/2021   Procedure: CYSTOSCOPY;  Surgeon: Marco Severs, MD;  Location: [facility];  Service: Urology;  Laterality: N/A;   CYSTOSCOPY Left 11/08/2022   Procedure: CYSTOSCOPY;  Surgeon: Marco Severs, MD;  Location: [facility];  Service: Urology;  Laterality: Left;   CYSTOSCOPY W/ RETROGRADES Bilateral 08/22/2015   Procedure: CYSTOSCOPY WITH RETROGRADE PYELOGRAM;  Surgeon: Homero Luster, MD;  Location: [facility];  Service: Urology;  Laterality: Bilateral;   CYSTOSCOPY W/ RETROGRADES Bilateral 01/16/2016   Procedure:  CYSTOSCOPY WITH RETROGRADE PYELOGRAM;  Surgeon: Homero Luster, MD;  Location: [facility];  Service: Urology;  Laterality: Bilateral;   CYSTOSCOPY W/ RETROGRADES Bilateral 01/07/2017   Procedure: CYSTOSCOPY WITH RETROGRADE PYELOGRAM;  Surgeon: Homero Luster, MD;  Location: [facility];  Service: Urology;  Laterality: Bilateral;   CYSTOSCOPY WITH BIOPSY N/A 08/22/2015   Procedure: CYSTOSCOPY WITH BLADDER BIOPSY;  Surgeon: Homero Luster, MD;  Location: [facility];  Service: Urology;  Laterality: N/A;   CYSTOSCOPY WITH BIOPSY N/A 01/16/2016   Procedure: CYSTOSCOPY WITH BLADDER BIOPSY;  Surgeon: Homero Luster, MD;  Location: [facility];  Service: Urology;  Laterality: N/A;   CYSTOSCOPY WITH FULGERATION N/A 01/16/2016   Procedure: CYSTOSCOPY WITH FULGERATION;  Surgeon: Homero Luster, MD;  Location: [facility];  Service: Urology;  Laterality: N/A;   CYSTOSCOPY/RETROGRADE/URETEROSCOPY Left 09/06/2022   Procedure: CYSTOSCOPY/RETROGRADE/URETEROSCOPY/STENT PLACEMENT;  Surgeon: Marco Severs, MD;  Location: [facility];  Service: Urology;  Laterality: Left;   KNEE ARTHROSCOPY Right 1989   LEFT HEART CATH AND CORONARY ANGIOGRAPHY N/A 01/21/2018   Procedure: LEFT HEART CATH AND CORONARY ANGIOGRAPHY;  Surgeon: Arleen Lacer, MD;  Location: [facility] INVASIVE CV LAB;  Service: Cardiovascular;; 95% RI-DES PCI.  80% o-pCX (PTCA) -> dCX 80%-100% CTO (unsuccessful PTCA unable to cross distal lesion with balloon.)  100% CTO dLAD. RPDA 80% and 70% as well as P AV 180%, PA V2 60%.  (too small for PCI)   LEFT HEART CATH AND CORONARY ANGIOGRAPHY N/A 09/04/2018   Procedure: LEFT HEART CATH AND CORONARY ANGIOGRAPHY;  Surgeon: Arnoldo Lapping, MD;  Location: [name] INVASIVE CV LAB: (presumed Inferior STEMI) = progression of small rPDA -95% (PTCA only). Patent RI stent. CTO of apical LAD & mCx.   ROBOT ASSITED LAPAROSCOPIC NEPHROURETERECTOMY Left 11/08/2022   Procedure: XI ROBOT ASSITED LAPAROSCOPIC NEPHROURETERECTOMY;  Surgeon: Marco Severs, MD;  Location: [facility];   Service: Urology;  Laterality: Left;   ROTATOR CUFF REPAIR Bilateral 2000   rt. leg fracture surgery     TRANSTHORACIC ECHOCARDIOGRAM  01/21/2018   Mild LVH.  EF 50 and 55%.  Apical inferior hypokinesis.  Mid-apical anterolateral hypokinesis.  Normal diastolic function for age    TRANSTHORACIC ECHOCARDIOGRAM  09/04/2018   -? Inf STEMI vs. Pericarditis:  Mildly reduced EF of 45 to 50%.  Impaired relaxation-GR 1 DD.  Severe hypokinesis of the anterolateral and inferolateral wall.  Small-moderate anterior pericardial effusion.  Moderate aortic sclerosis but no stenosis.   TRANSURETHRAL RESECTION OF BLADDER TUMOR N/A 01/07/2017   Procedure: TRANSURETHRAL RESECTION OF BLADDER TUMOR (TURBT);  Surgeon: Homero Luster, MD;  Location: [facility];  Service: Urology;  Laterality: N/A;   TRANSURETHRAL RESECTION OF BLADDER TUMOR N/A 01/21/2020   Procedure: TRANSURETHRAL RESECTION OF BLADDER TUMOR (TURBT);  Surgeon: Claretta Croft,  Arden Beck, MD;  Location: [facility];  Service: Urology;  Laterality: N/A;   TRANSURETHRAL RESECTION OF BLADDER TUMOR N/A 02/09/2021   Procedure: TRANSURETHRAL RESECTION OF BLADDER TUMOR (TURBT);  Surgeon: Marco Severs, MD;  Location: [facility];  Service: Urology;  Laterality: N/A;   URETERAL BIOPSY Left 09/06/2022   Procedure: URETERAL BIOPSY/fulgeration;  Surgeon: Marco Severs, MD;  Location: [facility];  Service: Urology;  Laterality: Left;   WISDOM TOOTH EXTRACTION     Patient Active Problem List   Diagnosis Date Noted   Epidermoid metaplasia esophagus 07/19/2023   Barrett's esophagus without dysplasia 07/19/2023   Abnormal endoscopy of upper gastrointestinal tract 07/19/2023   Genetic testing 02/10/2023   Polyposis of colon 01/25/2023   Family history of prostate cancer    Family history of testicular cancer    Family history of breast cancer    Ureteral cancer, left (HCC) 11/08/2022   Left renal mass 09/06/2022   History of pericarditis 04/26/2019   History of ST elevation  myocardial infarction (STEMI) 09/05/2018   Unstable angina (HCC) 09/04/2018   Presence of drug coated stent in ramus intermedius coronary artery 05/12/2018   COPD with acute exacerbation (HCC) 01/21/2018   Multivessel CAD - CTO dLAD, mCx. DES PCI RI, PTCA of RPDA 01/21/2018   Stable angina (HCC) 01/20/2018   Depression 01/20/2018   Essential hypertension 01/20/2018   Sleep apnea 10/06/2016   Tobacco use disorder 12/19/2015   Type 2 diabetes mellitus with circulatory disorder, without long-term current use of insulin  (HCC) 12/19/2015   Hyperlipidemia with target LDL less than 70 12/19/2015   Bladder cancer (HCC) 12/19/2015   Esophageal reflux 12/19/2015   Essential tremor 12/19/2015   History of stroke 12/19/2015    PCP: Authur Leghorn, MD   REFERRING PROVIDER: Authur Leghorn, MD   REFERRING DIAG: poor balance  THERAPY DIAG:  Unsteadiness on feet  Muscle weakness (generalized)  Rationale for Evaluation and Treatment: Rehabilitation  ONSET DATE: Winter 2024  SUBJECTIVE:   SUBJECTIVE STATEMENT: Patient reports that he feels alright today. He has not had any problems since his last appointment.   PERTINENT HISTORY: Type 2 diabetes, angina, hypertension, COPD, history of cancer, history of a stroke, history of a myocardial infarction, tremors, arthritis, and depression PAIN:  Are you having pain? Yes: NPRS scale: no pain score provided  Pain location: back, shoulders, and knees Pain description: aching, sore   PRECAUTIONS: Fall  RED FLAGS: None   WEIGHT BEARING RESTRICTIONS: No  FALLS:  Has patient fallen in last 6 months? Yes. Number of falls 2  LIVING ENVIRONMENT: Lives with: lives alone Lives in: House/apartment Stairs: Yes: Internal: 1 steps; none Has following equipment at home: Ramped entry  OCCUPATION: retired  PLOF: Independent with basic ADLs  PATIENT GOALS: improved safety, be able to use his chainsaw, improved strength, and be able to  pick up things off the floor  NEXT MD VISIT: unsure  OBJECTIVE:  Note: Objective measures were completed at Evaluation unless otherwise noted.  COGNITION: Overall cognitive status: Within functional limits for tasks assessed     SENSATION: Light touch: no significant deficits noted Patient reports numbness in both legs with the left being worse and more frequent than the right  LOWER EXTREMITY ROM: WFL for activities assessed  LOWER EXTREMITY MMT:  MMT Right eval Left eval  Hip flexion 4-/5; hip pain 3+/5  Hip extension    Hip abduction    Hip adduction    Hip internal rotation  Hip external rotation    Knee flexion 4/5 5/5  Knee extension 3+/5 5/5  Ankle dorsiflexion 3/5 3/5  Ankle plantarflexion    Ankle inversion    Ankle eversion     (Blank rows = not tested)  FUNCTIONAL TESTS:  5 times sit to stand: 1:05 with BUE support from armrests Timed up and go (TUG): 24.17 seconds 30 second sit to stand: 3 reps  GAIT: Assistive device utilized: None Level of assistance: Complete Independence Comments: Decreased gait speed with poor lateral stability and foot clearance bilaterally                                                                                                                                TREATMENT DATE:                                     11/11/23 EXERCISE LOG  Exercise Repetitions and Resistance Comments  Nustep  L5 x 16.5 minutes   Rocker board  5 minutes BUE support from parallel bars  Tandem walking on foam  5 laps  Intermittent UE support from parallel bars  Side stepping on foam  4 minutes Intermittent UE support from parallel bars   Seated clams  Green t-band x 3.5 minutes   Seated hip ADD isometric  3 x 15 reps  w/ 5 second hold   Toe taps on step  14 step x 2.5 minutes  From foam pad ; BUE support from parallel bars   Lunges on BOSU  Ball up x 3 x 15 reps each  BUE support from parallel bars  Sit to stand  2 x 10 reps     Blank cell  = exercise not performed today                                    11/08/23 EXERCISE LOG  Exercise Repetitions and Resistance Comments  Nustep  L5 x 17 minutes   Standing hip ABD  2 minutes Alternating LE  Rocker board  5 minutes   Toe taps on step  14 step x 3 minutes  Alternating every 5 reps   Tandem walking on foam  2 minutes With BUE support from parallel bars  Side stepping on foam  2 minutes   Cybex knee flexion 30# x 3 minutes    Cybex knee extension 10# x 2.5 minutes    Blank cell = exercise not performed today                                    11/01/23 EXERCISE LOG  Exercise Repetitions and Resistance Comments  Nustep  L5 x 17 minutes   Static stance on foam 4.5 minutes  Intermittent upper extremity support with dual tasking  Marching on foam 4 minutes With bilateral upper extremity support from parallel bars  Tandem walking on foam 4 minutes With bilateral upper extremity support from parallel bars  Sidestepping on foam 4 minutes Intermittent upper extremity support from parallel bars  Rocker board X 4 mins    Blank cell = exercise not performed today  SIT to stand   40.31 secs TUG   22.89 secs  PATIENT EDUCATION:  Education details: safety and exercising Person educated: Patient Education method: Explanation Education comprehension: verbalized understanding  HOME EXERCISE PROGRAM:   ASSESSMENT:  CLINICAL IMPRESSION:  Patient was progressed with familiar interventions for improved lower extremity strength and dynamic stability. He required minimal cueing with today's interventions for proper biomechanics and pacing to facilitate increased lower extremity demand. He required brief rest breaks after today's standing interventions due to fatigue. He reported feeling tired upon the conclusion of treatment. He continues to require skilled physical therapy to address his remaining impairments to maximize his safety and functional mobility.   OBJECTIVE IMPAIRMENTS:  Abnormal gait, decreased activity tolerance, decreased balance, decreased mobility, difficulty walking, decreased strength, and pain.   ACTIVITY LIMITATIONS: carrying, lifting, bending, standing, stairs, transfers, bathing, and locomotion level  PARTICIPATION LIMITATIONS: meal prep, cleaning, laundry, shopping, community activity, and yard work  PERSONAL FACTORS: Age, Past/current experiences, Time since onset of injury/illness/exacerbation, and 3+ comorbidities: Type 2 diabetes, angina, hypertension, COPD, history of cancer, history of a stroke, history of a myocardial infarction, tremors, arthritis, and depression are also affecting patient's functional outcome.   REHAB POTENTIAL: Fair    CLINICAL DECISION MAKING: Evolving/moderate complexity  EVALUATION COMPLEXITY: Moderate   GOALS: Goals reviewed with patient? Yes  SHORT TERM GOALS: Target date: 10/03/23 Patient will be independent with his initial HEP. Baseline: Goal status: MET  2.  Patient will improve his timed up and go time to 19 seconds or less for improved functional mobility. Baseline: 5/16: 18.6 seconds Goal status: MET  3.  Patient will improve his 5 times sit to stand time to 45 seconds or less for improved lower extremity power. Baseline: 5/16: 42.6 seconds Goal status: MET  LONG TERM GOALS: Target date: 10/24/23  Patient will be independent with his advanced HEP. Baseline: I have not been doing my exercises much Goal status: IN PROGRESS  2.  Patient will be able to safely pick up items from the floor for improved household independence. Baseline:  Goal status: MET  3.  Patient will improve his 5 times sit to stand time to 30 seconds or less for improved lower extremity power. Baseline: 47.02 seconds on 10/21/23    11-01-23  40.31 secs Goal status: In Progress    4.  Patient will improve his timed up and go time to 15 seconds or less to reduce his fall risk. Baseline: 26.82 seconds on 10/21/23   11-01-23   22.89 secs Goal status: IN PROGRESS  5.  Patient will improve his 30 second sit to stand test to at least 5 repetitions for improved lower extremity power needed for transfers. Baseline: 3 reps on 10/21/23 Goal status: IN PROGRESS  PLAN:  PT FREQUENCY: 2x/week  PT DURATION: 6 weeks  PLANNED INTERVENTIONS: 97164- PT Re-evaluation, 97750- Physical Performance Testing, 97110-Therapeutic exercises, 97530- Therapeutic activity, [id number]- Neuromuscular re-education, 97535- Self Care, [id number]- Manual therapy, [phone]- Gait training, Patient/Family education, Balance training, and Stair training  PLAN FOR NEXT SESSION:   Nustep, lower extremity strengthening, balance interventions, and gait training  Lane Pinon, PT 11/11/2023, 12:40 PM

## 2023-11-15 ENCOUNTER — Ambulatory Visit: Admitting: *Deleted

## 2023-11-15 DIAGNOSIS — M6281 Muscle weakness (generalized): Secondary | ICD-10-CM

## 2023-11-15 DIAGNOSIS — R2681 Unsteadiness on feet: Secondary | ICD-10-CM

## 2023-11-15 NOTE — Therapy (Signed)
 OUTPATIENT PHYSICAL THERAPY LOWER EXTREMITY TREATMENT   Patient Name: Andrew [name] MRN: [id number] DOB:1950/11/20, 73 y.o., male Today's Date: 11/15/2023  END OF SESSION:  PT End of Session - 11/15/23 1117     Visit Number 15    Number of Visits 18    Date for PT Re-Evaluation 12/02/23    PT Start Time 1100    PT Stop Time 1159    PT Time Calculation (min) 59 min                Past Medical History:  Diagnosis Date   Acute viral pericarditis 09/03/2018   Inferior STE - but Negative Troponin.  CP &SOB.  Felt to be Viral.   Anemia    Arthritis    Bladder cancer (HCC) 2014   CAD (coronary artery disease)    Cataract    bilateral   Complication of anesthesia    Depression    Diabetes mellitus without complication (HCC)    Essential hypertension 01/20/2018   in the past no longer on medication   Family history of breast cancer    Family history of prostate cancer    Family history of testicular cancer    GERD (gastroesophageal reflux disease)    Heart attack (HCC) 03/2022   Hx of adenomatous colonic polyps 09/2017   colonoscopy   Hyperlipidemia with target LDL less than 70 12/19/2015   Intraoperative floppy iris syndrome (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 DES PCI RI, PTCA of RPDA 01/21/2018   12/2017 - Cath for ? STEMI -> distal/apical LAD & m-dCx CTO (unable to cross Cx).  Mod rPDA. Severe RI - DES PCI.  08/2018 - Cath for ? Inf STEMI - RI stent patent & CTO dLAD/mCx. Progression of rPDA 95% -> PTCA only.  Thought to be Pericarditis & not MI (troponin negative).    Neuromuscular disorder (HCC)    Polyposis of colon 01/25/2023   Sleep apnea    BiPAP not currently being used   Status post tendon repair 1989   STEMI (ST elevation myocardial infarction) (HCC) 01/20/2018   Cardiac cath January 21, 2018: Severe multivessel disease with unclear lesion, but opted for PCI/DES x1 to the RI.  Appeared to have CTO of the distal LAD  and circumflex as well as moderate mid LAD and diagonal disease as well as PDA.SABRA  Unable to cross circumflex lesion.   STEMI (ST elevation myocardial infarction) (HCC) 03/2022   Stroke (HCC) 11/2015   At times pt has dizziness with loss of vision   Tremors of nervous system 2011   Past Surgical History:  Procedure Laterality Date   APPENDECTOMY     BLADDER SURGERY     COLONOSCOPY     CORONARY STENT INTERVENTION N/A 01/21/2018   Procedure: CORONARY STENT INTERVENTION;  Surgeon: Anner Alm ORN, MD;  Location: [facility] INVASIVE CV LAB;  Service: Cardiovascular;  Laterality: N/A;  95% Ramus Intermedius -PCI with synergy DES 2.25 mm x 12 mm postdilated 2.4 mm.   CORONARY/GRAFT ACUTE MI REVASCULARIZATION N/A 01/21/2018   Procedure: Coronary/Graft Acute MI Revascularization;  Surgeon: Anner Alm ORN, MD;  Location: [facility] INVASIVE CV LAB;  Service: Cardiovascular;  Laterality: N/A;  attempted revas to distal CFX;    CORONARY/GRAFT ACUTE MI REVASCULARIZATION N/A 09/04/2018   Procedure: Coronary/Graft Acute MI Revascularization;  Surgeon: Wonda Sharper, MD;  Location: [facility] INVASIVE CV LAB:: PTCA/POBA of rPDA (2.0 mm balloon)   CYSTOSCOPY N/A 01/21/2020  Procedure: CYSTOSCOPY;  Surgeon: Sherrilee Belvie CROME, MD;  Location: [facility];  Service: Urology;  Laterality: N/A;   CYSTOSCOPY N/A 02/09/2021   Procedure: CYSTOSCOPY;  Surgeon: Sherrilee Belvie CROME, MD;  Location: [facility];  Service: Urology;  Laterality: N/A;   CYSTOSCOPY Left 11/08/2022   Procedure: CYSTOSCOPY;  Surgeon: Sherrilee Belvie CROME, MD;  Location: [facility];  Service: Urology;  Laterality: Left;   CYSTOSCOPY W/ RETROGRADES Bilateral 08/22/2015   Procedure: CYSTOSCOPY WITH RETROGRADE PYELOGRAM;  Surgeon: Norleen Seltzer, MD;  Location: [facility];  Service: Urology;  Laterality: Bilateral;   CYSTOSCOPY W/ RETROGRADES Bilateral 01/16/2016   Procedure: CYSTOSCOPY WITH RETROGRADE PYELOGRAM;  Surgeon: Norleen Seltzer, MD;  Location: [facility];  Service: Urology;  Laterality:  Bilateral;   CYSTOSCOPY W/ RETROGRADES Bilateral 01/07/2017   Procedure: CYSTOSCOPY WITH RETROGRADE PYELOGRAM;  Surgeon: Seltzer Norleen, MD;  Location: [facility];  Service: Urology;  Laterality: Bilateral;   CYSTOSCOPY WITH BIOPSY N/A 08/22/2015   Procedure: CYSTOSCOPY WITH BLADDER BIOPSY;  Surgeon: Norleen Seltzer, MD;  Location: [facility];  Service: Urology;  Laterality: N/A;   CYSTOSCOPY WITH BIOPSY N/A 01/16/2016   Procedure: CYSTOSCOPY WITH BLADDER BIOPSY;  Surgeon: Norleen Seltzer, MD;  Location: [facility];  Service: Urology;  Laterality: N/A;   CYSTOSCOPY WITH FULGERATION N/A 01/16/2016   Procedure: CYSTOSCOPY WITH FULGERATION;  Surgeon: Norleen Seltzer, MD;  Location: [facility];  Service: Urology;  Laterality: N/A;   CYSTOSCOPY/RETROGRADE/URETEROSCOPY Left 09/06/2022   Procedure: CYSTOSCOPY/RETROGRADE/URETEROSCOPY/STENT PLACEMENT;  Surgeon: Sherrilee Belvie CROME, MD;  Location: [facility];  Service: Urology;  Laterality: Left;   KNEE ARTHROSCOPY Right 1989   LEFT HEART CATH AND CORONARY ANGIOGRAPHY N/A 01/21/2018   Procedure: LEFT HEART CATH AND CORONARY ANGIOGRAPHY;  Surgeon: Anner Alm ORN, MD;  Location: [facility] INVASIVE CV LAB;  Service: Cardiovascular;; 95% RI-DES PCI.  80% o-pCX (PTCA) -> dCX 80%-100% CTO (unsuccessful PTCA unable to cross distal lesion with balloon.)  100% CTO dLAD. RPDA 80% and 70% as well as P AV 180%, PA V2 60%.  (too small for PCI)   LEFT HEART CATH AND CORONARY ANGIOGRAPHY N/A 09/04/2018   Procedure: LEFT HEART CATH AND CORONARY ANGIOGRAPHY;  Surgeon: Wonda Sharper, MD;  Location: [facility] INVASIVE CV LAB: (presumed Inferior STEMI) = progression of small rPDA -95% (PTCA only). Patent RI stent. CTO of apical LAD & mCx.   ROBOT ASSITED LAPAROSCOPIC NEPHROURETERECTOMY Left 11/08/2022   Procedure: XI ROBOT ASSITED LAPAROSCOPIC NEPHROURETERECTOMY;  Surgeon: Sherrilee Belvie CROME, MD;  Location: [facility];  Service: Urology;  Laterality: Left;   ROTATOR CUFF REPAIR Bilateral 2000   rt. leg fracture surgery      TRANSTHORACIC ECHOCARDIOGRAM  01/21/2018   Mild LVH.  EF 50 and 55%.  Apical inferior hypokinesis.  Mid-apical anterolateral hypokinesis.  Normal diastolic function for age    TRANSTHORACIC ECHOCARDIOGRAM  09/04/2018   -? Inf STEMI vs. Pericarditis:  Mildly reduced EF of 45 to 50%.  Impaired relaxation-GR 1 DD.  Severe hypokinesis of the anterolateral and inferolateral wall.  Small-moderate anterior pericardial effusion.  Moderate aortic sclerosis but no stenosis.   TRANSURETHRAL RESECTION OF BLADDER TUMOR N/A 01/07/2017   Procedure: TRANSURETHRAL RESECTION OF BLADDER TUMOR (TURBT);  Surgeon: Seltzer Norleen, MD;  Location: [facility];  Service: Urology;  Laterality: N/A;   TRANSURETHRAL RESECTION OF BLADDER TUMOR N/A 01/21/2020   Procedure: TRANSURETHRAL RESECTION OF BLADDER TUMOR (TURBT);  Surgeon: Sherrilee Belvie CROME, MD;  Location: [facility];  Service: Urology;  Laterality: N/A;   TRANSURETHRAL RESECTION OF BLADDER TUMOR N/A  02/09/2021   Procedure: TRANSURETHRAL RESECTION OF BLADDER TUMOR (TURBT);  Surgeon: Sherrilee Belvie CROME, MD;  Location: [facility];  Service: Urology;  Laterality: N/A;   URETERAL BIOPSY Left 09/06/2022   Procedure: URETERAL BIOPSY/fulgeration;  Surgeon: Sherrilee Belvie CROME, MD;  Location: [facility];  Service: Urology;  Laterality: Left;   WISDOM TOOTH EXTRACTION     Patient Active Problem List   Diagnosis Date Noted   Epidermoid metaplasia esophagus 07/19/2023   Barrett's esophagus without dysplasia 07/19/2023   Abnormal endoscopy of upper gastrointestinal tract 07/19/2023   Genetic testing 02/10/2023   Polyposis of colon 01/25/2023   Family history of prostate cancer    Family history of testicular cancer    Family history of breast cancer    Ureteral cancer, 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 DES PCI RI, PTCA of RPDA 01/21/2018   Stable angina (HCC) 01/20/2018   Depression 01/20/2018   Essential hypertension 01/20/2018   Sleep apnea 10/06/2016   Tobacco use disorder 12/19/2015   Type 2 diabetes mellitus with circulatory disorder, without long-term current use of insulin  (HCC) 12/19/2015   Hyperlipidemia with target LDL less than 70 12/19/2015   Bladder cancer (HCC) 12/19/2015   Esophageal reflux 12/19/2015   Essential tremor 12/19/2015   History of stroke 12/19/2015    PCP: Gladystine Erminio CROME, MD   REFERRING PROVIDER: Gladystine Erminio CROME, MD   REFERRING DIAG: poor balance  THERAPY DIAG:  Unsteadiness on feet  Muscle weakness (generalized)  Rationale for Evaluation and Treatment: Rehabilitation  ONSET DATE: Winter 2024  SUBJECTIVE:   SUBJECTIVE STATEMENT: Patient reports that he feels alright today. He has not had any problems since his last appointment.   PERTINENT HISTORY: Type 2 diabetes, angina, hypertension, COPD, history of cancer, history of a stroke, history of a myocardial infarction, tremors, arthritis, and depression PAIN:  Are you having pain? Yes: NPRS scale: no pain score provided  Pain location: back, shoulders, and knees Pain description: aching, sore   PRECAUTIONS: Fall  RED FLAGS: None   WEIGHT BEARING RESTRICTIONS: No  FALLS:  Has patient fallen in last 6 months? Yes. Number of falls 2  LIVING ENVIRONMENT: Lives with: lives alone Lives in: House/apartment Stairs: Yes: Internal: 1 steps; none Has following equipment at home: Ramped entry  OCCUPATION: retired  PLOF: Independent with basic ADLs  PATIENT GOALS: improved safety, be able to use his chainsaw, improved strength, and be able to pick up things off the floor  NEXT MD VISIT: unsure  OBJECTIVE:  Note: Objective measures were completed at  Evaluation unless otherwise noted.  COGNITION: Overall cognitive status: Within functional limits for tasks assessed     SENSATION: Light touch: no significant deficits noted Patient reports numbness in both legs with the left being worse and more frequent than the right  LOWER EXTREMITY ROM: WFL for activities assessed  LOWER EXTREMITY MMT:  MMT Right eval Left eval  Hip flexion 4-/5; hip pain 3+/5  Hip extension    Hip abduction    Hip adduction    Hip internal rotation    Hip external rotation    Knee flexion 4/5 5/5  Knee extension 3+/5 5/5  Ankle dorsiflexion  3/5 3/5  Ankle plantarflexion    Ankle inversion    Ankle eversion     (Blank rows = not tested)  FUNCTIONAL TESTS:  5 times sit to stand: 1:05 with BUE support from armrests Timed up and go (TUG): 24.17 seconds 30 second sit to stand: 3 reps  GAIT: Assistive device utilized: None Level of assistance: Complete Independence Comments: Decreased gait speed with poor lateral stability and foot clearance bilaterally                                                                                                                                TREATMENT DATE:                                     11/15/23 EXERCISE LOG  Exercise Repetitions and Resistance Comments  Nustep  L5 x 16.5 minutes   Rocker board  5 minutes BUE support from parallel bars  Tandem walking on balance beam 5 laps  Intermittent UE support from parallel bars  Side stepping balance beam  5 minutes Intermittent UE support from parallel bars   Seated clams     Seated hip ADD isometric  3 x 15 reps  w/ 5 second hold   Toe taps on step  14 step x 2.5 minutes  From foam pad ; BUE support from parallel bars   Lunges on BOSU  Ball up x 3 x 15 reps each  BUE support from parallel bars  Sit to stand  2 x 10 reps     Blank cell = exercise not performed today                                    11/08/23 EXERCISE LOG  Exercise Repetitions and Resistance  Comments  Nustep  L5 x 17 minutes   Standing hip ABD  2 minutes Alternating LE  Rocker board  5 minutes   Toe taps on step  14 step x 3 minutes  Alternating every 5 reps   Tandem walking on foam  2 minutes With BUE support from parallel bars  Side stepping on foam  2 minutes   Cybex knee flexion 30# x 3 minutes    Cybex knee extension 10# x 2.5 minutes    Blank cell = exercise not performed today                                    11/01/23 EXERCISE LOG  Exercise Repetitions and Resistance Comments  Nustep  L5 x 17 minutes   Static stance on foam 4.5 minutes Intermittent upper extremity support with dual tasking  Marching on foam 4 minutes With bilateral upper extremity support from parallel bars  Tandem walking on foam 4 minutes With bilateral upper extremity support from parallel bars  Sidestepping on foam 4 minutes Intermittent upper extremity support from parallel bars  Rocker board X 4 mins    Blank cell = exercise not performed today  SIT to stand   40.31 secs TUG   22.89 secs  PATIENT EDUCATION:  Education details: safety and exercising Person educated: Patient Education method: Explanation Education comprehension: verbalized understanding  HOME EXERCISE PROGRAM:   ASSESSMENT:  CLINICAL IMPRESSION:  Patient arrived  today doing fairly well and was able to continue with LE strengthening as well  as balance act's and did well with mainly fatigue today.  OBJECTIVE IMPAIRMENTS: Abnormal gait, decreased activity tolerance, decreased balance, decreased mobility, difficulty walking, decreased strength, and pain.   ACTIVITY LIMITATIONS: carrying, lifting, bending, standing, stairs, transfers, bathing, and locomotion level  PARTICIPATION LIMITATIONS: meal prep, cleaning, laundry, shopping, community activity, and yard work  PERSONAL FACTORS: Age, Past/current experiences, Time since onset of injury/illness/exacerbation, and 3+ comorbidities: Type 2 diabetes, angina,  hypertension, COPD, history of cancer, history of a stroke, history of a myocardial infarction, tremors, arthritis, and depression are also affecting patient's functional outcome.   REHAB POTENTIAL: Fair    CLINICAL DECISION MAKING: Evolving/moderate complexity  EVALUATION COMPLEXITY: Moderate   GOALS: Goals reviewed with patient? Yes  SHORT TERM GOALS: Target date: 10/03/23 Patient will be independent with his initial HEP. Baseline: Goal status: MET  2.  Patient will improve his timed up and go time to 19 seconds or less for improved functional mobility. Baseline: 5/16: 18.6 seconds Goal status: MET  3.  Patient will improve his 5 times sit to stand time to 45 seconds or less for improved lower extremity power. Baseline: 5/16: 42.6 seconds Goal status: MET  LONG TERM GOALS: Target date: 10/24/23  Patient will be independent with his advanced HEP. Baseline: I have not been doing my exercises much Goal status: IN PROGRESS  2.  Patient will be able to safely pick up items from the floor for improved household independence. Baseline:  Goal status: MET  3.  Patient will improve his 5 times sit to stand time to 30 seconds or less for improved lower extremity power. Baseline: 47.02 seconds on 10/21/23    11-01-23  40.31 secs Goal status: In Progress    4.  Patient will improve his timed up and go time to 15 seconds or less to reduce his fall risk. Baseline: 26.82 seconds on 10/21/23   11-01-23  22.89 secs Goal status: IN PROGRESS  5.  Patient will improve his 30 second sit to stand test to at least 5 repetitions for improved lower extremity power needed for transfers. Baseline: 3 reps on 10/21/23 Goal status: IN PROGRESS  PLAN:  PT FREQUENCY: 2x/week  PT DURATION: 6 weeks  PLANNED INTERVENTIONS: 97164- PT Re-evaluation, 97750- Physical Performance Testing, 97110-Therapeutic exercises, 97530- Therapeutic activity, 97112- Neuromuscular re-education, 97535- Self Care, [id number]-  Manual therapy, [phone]- Gait training, Patient/Family education, Balance training, and Stair training  PLAN FOR NEXT SESSION:   Nustep, lower extremity strengthening, balance interventions, and gait training   Sarah Baez,CHRIS, PTA 11/15/2023, 12:00 PM

## 2023-11-18 ENCOUNTER — Encounter

## 2023-11-22 ENCOUNTER — Ambulatory Visit: Attending: Family Medicine

## 2023-11-22 DIAGNOSIS — M6281 Muscle weakness (generalized): Secondary | ICD-10-CM | POA: Diagnosis present

## 2023-11-22 DIAGNOSIS — R2681 Unsteadiness on feet: Secondary | ICD-10-CM | POA: Diagnosis present

## 2023-11-22 NOTE — Therapy (Signed)
 OUTPATIENT PHYSICAL THERAPY LOWER EXTREMITY TREATMENT   Patient Name: Andrew [name] MRN: [id number] DOB:03-02-1951, 73 y.o., male Today's Date: 11/22/2023  END OF SESSION:  PT End of Session - 11/22/23 1108     Visit Number 16    Number of Visits 18    Date for PT Re-Evaluation 12/02/23    PT Start Time 1100    PT Stop Time 1155    PT Time Calculation (min) 55 min                Past Medical History:  Diagnosis Date   Acute viral pericarditis 09/03/2018   Inferior STE - but Negative Troponin.  CP &SOB.  Felt to be Viral.   Anemia    Arthritis    Bladder cancer (HCC) 2014   CAD (coronary artery disease)    Cataract    bilateral   Complication of anesthesia    Depression    Diabetes mellitus without complication (HCC)    Essential hypertension 01/20/2018   in the past no longer on medication   Family history of breast cancer    Family history of prostate cancer    Family history of testicular cancer    GERD (gastroesophageal reflux disease)    Heart attack (HCC) 03/2022   Hx of adenomatous colonic polyps 09/2017   colonoscopy   Hyperlipidemia with target LDL less than 70 12/19/2015   Intraoperative floppy iris syndrome (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 DES PCI RI, PTCA of RPDA 01/21/2018   12/2017 - Cath for ? STEMI -> distal/apical LAD & m-dCx CTO (unable to cross Cx).  Mod rPDA. Severe RI - DES PCI.  08/2018 - Cath for ? Inf STEMI - RI stent patent & CTO dLAD/mCx. Progression of rPDA 95% -> PTCA only.  Thought to be Pericarditis & not MI (troponin negative).    Neuromuscular disorder (HCC)    Polyposis of colon 01/25/2023   Sleep apnea    BiPAP not currently being used   Status post tendon repair 1989   STEMI (ST elevation myocardial infarction) (HCC) 01/20/2018   Cardiac cath January 21, 2018: Severe multivessel disease with unclear lesion, but opted for PCI/DES x1 to the RI.  Appeared to have CTO of the distal LAD  and circumflex as well as moderate mid LAD and diagonal disease as well as PDA.SABRA  Unable to cross circumflex lesion.   STEMI (ST elevation myocardial infarction) (HCC) 03/2022   Stroke (HCC) 11/2015   At times pt has dizziness with loss of vision   Tremors of nervous system 2011   Past Surgical History:  Procedure Laterality Date   APPENDECTOMY     BLADDER SURGERY     COLONOSCOPY     CORONARY STENT INTERVENTION N/A 01/21/2018   Procedure: CORONARY STENT INTERVENTION;  Surgeon: Anner Alm ORN, MD;  Location: [facility] INVASIVE CV LAB;  Service: Cardiovascular;  Laterality: N/A;  95% Ramus Intermedius -PCI with synergy DES 2.25 mm x 12 mm postdilated 2.4 mm.   CORONARY/GRAFT ACUTE MI REVASCULARIZATION N/A 01/21/2018   Procedure: Coronary/Graft Acute MI Revascularization;  Surgeon: Anner Alm ORN, MD;  Location: [facility] INVASIVE CV LAB;  Service: Cardiovascular;  Laterality: N/A;  attempted revas to distal CFX;    CORONARY/GRAFT ACUTE MI REVASCULARIZATION N/A 09/04/2018   Procedure: Coronary/Graft Acute MI Revascularization;  Surgeon: Wonda Sharper, MD;  Location: [facility] INVASIVE CV LAB:: PTCA/POBA of rPDA (2.0 mm balloon)   CYSTOSCOPY N/A 01/21/2020  Procedure: CYSTOSCOPY;  Surgeon: Sherrilee Belvie CROME, MD;  Location: [facility];  Service: Urology;  Laterality: N/A;   CYSTOSCOPY N/A 02/09/2021   Procedure: CYSTOSCOPY;  Surgeon: Sherrilee Belvie CROME, MD;  Location: [facility];  Service: Urology;  Laterality: N/A;   CYSTOSCOPY Left 11/08/2022   Procedure: CYSTOSCOPY;  Surgeon: Sherrilee Belvie CROME, MD;  Location: [facility];  Service: Urology;  Laterality: Left;   CYSTOSCOPY W/ RETROGRADES Bilateral 08/22/2015   Procedure: CYSTOSCOPY WITH RETROGRADE PYELOGRAM;  Surgeon: Norleen Seltzer, MD;  Location: [facility];  Service: Urology;  Laterality: Bilateral;   CYSTOSCOPY W/ RETROGRADES Bilateral 01/16/2016   Procedure: CYSTOSCOPY WITH RETROGRADE PYELOGRAM;  Surgeon: Norleen Seltzer, MD;  Location: [facility];  Service: Urology;  Laterality:  Bilateral;   CYSTOSCOPY W/ RETROGRADES Bilateral 01/07/2017   Procedure: CYSTOSCOPY WITH RETROGRADE PYELOGRAM;  Surgeon: Seltzer Norleen, MD;  Location: [facility];  Service: Urology;  Laterality: Bilateral;   CYSTOSCOPY WITH BIOPSY N/A 08/22/2015   Procedure: CYSTOSCOPY WITH BLADDER BIOPSY;  Surgeon: Norleen Seltzer, MD;  Location: [facility];  Service: Urology;  Laterality: N/A;   CYSTOSCOPY WITH BIOPSY N/A 01/16/2016   Procedure: CYSTOSCOPY WITH BLADDER BIOPSY;  Surgeon: Norleen Seltzer, MD;  Location: [facility];  Service: Urology;  Laterality: N/A;   CYSTOSCOPY WITH FULGERATION N/A 01/16/2016   Procedure: CYSTOSCOPY WITH FULGERATION;  Surgeon: Norleen Seltzer, MD;  Location: [facility];  Service: Urology;  Laterality: N/A;   CYSTOSCOPY/RETROGRADE/URETEROSCOPY Left 09/06/2022   Procedure: CYSTOSCOPY/RETROGRADE/URETEROSCOPY/STENT PLACEMENT;  Surgeon: Sherrilee Belvie CROME, MD;  Location: [facility];  Service: Urology;  Laterality: Left;   KNEE ARTHROSCOPY Right 1989   LEFT HEART CATH AND CORONARY ANGIOGRAPHY N/A 01/21/2018   Procedure: LEFT HEART CATH AND CORONARY ANGIOGRAPHY;  Surgeon: Anner Alm ORN, MD;  Location: [facility] INVASIVE CV LAB;  Service: Cardiovascular;; 95% RI-DES PCI.  80% o-pCX (PTCA) -> dCX 80%-100% CTO (unsuccessful PTCA unable to cross distal lesion with balloon.)  100% CTO dLAD. RPDA 80% and 70% as well as P AV 180%, PA V2 60%.  (too small for PCI)   LEFT HEART CATH AND CORONARY ANGIOGRAPHY N/A 09/04/2018   Procedure: LEFT HEART CATH AND CORONARY ANGIOGRAPHY;  Surgeon: Wonda Sharper, MD;  Location: [facility] INVASIVE CV LAB: (presumed Inferior STEMI) = progression of small rPDA -95% (PTCA only). Patent RI stent. CTO of apical LAD & mCx.   ROBOT ASSITED LAPAROSCOPIC NEPHROURETERECTOMY Left 11/08/2022   Procedure: XI ROBOT ASSITED LAPAROSCOPIC NEPHROURETERECTOMY;  Surgeon: Sherrilee Belvie CROME, MD;  Location: [facility];  Service: Urology;  Laterality: Left;   ROTATOR CUFF REPAIR Bilateral 2000   rt. leg fracture surgery      TRANSTHORACIC ECHOCARDIOGRAM  01/21/2018   Mild LVH.  EF 50 and 55%.  Apical inferior hypokinesis.  Mid-apical anterolateral hypokinesis.  Normal diastolic function for age    TRANSTHORACIC ECHOCARDIOGRAM  09/04/2018   -? Inf STEMI vs. Pericarditis:  Mildly reduced EF of 45 to 50%.  Impaired relaxation-GR 1 DD.  Severe hypokinesis of the anterolateral and inferolateral wall.  Small-moderate anterior pericardial effusion.  Moderate aortic sclerosis but no stenosis.   TRANSURETHRAL RESECTION OF BLADDER TUMOR N/A 01/07/2017   Procedure: TRANSURETHRAL RESECTION OF BLADDER TUMOR (TURBT);  Surgeon: Seltzer Norleen, MD;  Location: [facility];  Service: Urology;  Laterality: N/A;   TRANSURETHRAL RESECTION OF BLADDER TUMOR N/A 01/21/2020   Procedure: TRANSURETHRAL RESECTION OF BLADDER TUMOR (TURBT);  Surgeon: Sherrilee Belvie CROME, MD;  Location: [facility];  Service: Urology;  Laterality: N/A;   TRANSURETHRAL RESECTION OF BLADDER TUMOR N/A  02/09/2021   Procedure: TRANSURETHRAL RESECTION OF BLADDER TUMOR (TURBT);  Surgeon: Sherrilee Belvie CROME, MD;  Location: [facility];  Service: Urology;  Laterality: N/A;   URETERAL BIOPSY Left 09/06/2022   Procedure: URETERAL BIOPSY/fulgeration;  Surgeon: Sherrilee Belvie CROME, MD;  Location: [facility];  Service: Urology;  Laterality: Left;   WISDOM TOOTH EXTRACTION     Patient Active Problem List   Diagnosis Date Noted   Epidermoid metaplasia esophagus 07/19/2023   Barrett's esophagus without dysplasia 07/19/2023   Abnormal endoscopy of upper gastrointestinal tract 07/19/2023   Genetic testing 02/10/2023   Polyposis of colon 01/25/2023   Family history of prostate cancer    Family history of testicular cancer    Family history of breast cancer    Ureteral cancer, 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 DES PCI RI, PTCA of RPDA 01/21/2018   Stable angina (HCC) 01/20/2018   Depression 01/20/2018   Essential hypertension 01/20/2018   Sleep apnea 10/06/2016   Tobacco use disorder 12/19/2015   Type 2 diabetes mellitus with circulatory disorder, without long-term current use of insulin  (HCC) 12/19/2015   Hyperlipidemia with target LDL less than 70 12/19/2015   Bladder cancer (HCC) 12/19/2015   Esophageal reflux 12/19/2015   Essential tremor 12/19/2015   History of stroke 12/19/2015    PCP: Gladystine Erminio CROME, MD   REFERRING PROVIDER: Gladystine Erminio CROME, MD   REFERRING DIAG: poor balance  THERAPY DIAG:  Unsteadiness on feet  Muscle weakness (generalized)  Rationale for Evaluation and Treatment: Rehabilitation  ONSET DATE: Winter 2024  SUBJECTIVE:   SUBJECTIVE STATEMENT: Patient reports that he feels alright today. He has not had any problems since his last appointment.   PERTINENT HISTORY: Type 2 diabetes, angina, hypertension, COPD, history of cancer, history of a stroke, history of a myocardial infarction, tremors, arthritis, and depression PAIN:  Are you having pain? Yes: NPRS scale: no pain score provided  Pain location: back, shoulders, and knees Pain description: aching, sore   PRECAUTIONS: Fall  RED FLAGS: None   WEIGHT BEARING RESTRICTIONS: No  FALLS:  Has patient fallen in last 6 months? Yes. Number of falls 2  LIVING ENVIRONMENT: Lives with: lives alone Lives in: House/apartment Stairs: Yes: Internal: 1 steps; none Has following equipment at home: Ramped entry  OCCUPATION: retired  PLOF: Independent with basic ADLs  PATIENT GOALS: improved safety, be able to use his chainsaw, improved strength, and be able to pick up things off the floor  NEXT MD VISIT: unsure  OBJECTIVE:  Note: Objective measures were completed at  Evaluation unless otherwise noted.  COGNITION: Overall cognitive status: Within functional limits for tasks assessed     SENSATION: Light touch: no significant deficits noted Patient reports numbness in both legs with the left being worse and more frequent than the right  LOWER EXTREMITY ROM: WFL for activities assessed  LOWER EXTREMITY MMT:  MMT Right eval Left eval  Hip flexion 4-/5; hip pain 3+/5  Hip extension    Hip abduction    Hip adduction    Hip internal rotation    Hip external rotation    Knee flexion 4/5 5/5  Knee extension 3+/5 5/5  Ankle dorsiflexion  3/5 3/5  Ankle plantarflexion    Ankle inversion    Ankle eversion     (Blank rows = not tested)  FUNCTIONAL TESTS:  5 times sit to stand: 1:05 with BUE support from armrests Timed up and go (TUG): 24.17 seconds 30 second sit to stand: 3 reps  GAIT: Assistive device utilized: None Level of assistance: Complete Independence Comments: Decreased gait speed with poor lateral stability and foot clearance bilaterally                                                                                                                                TREATMENT DATE:                                     11/22/23 EXERCISE LOG  Exercise Repetitions and Resistance Comments  Nustep  L5 x 17 minutes   Cybex Knee Flexion 30# x 4 mins   Cybex Knee Extension 10# x 4 mins   Rocker board  5 mins BUE support from parallel bars  Tandem walking on balance beam 5 mins Intermittent UE support from parallel bars  Side stepping balance beam  5 mins Intermittent UE support from parallel bars (forward/backward)  Seated clams     Seated hip ADD isometric     Toe taps on step     Lunges on BOSU     Sit to stand  2 x 10 reps     Blank cell = exercise not performed today                                    11/08/23 EXERCISE LOG  Exercise Repetitions and Resistance Comments  Nustep  L5 x 17 minutes   Standing hip ABD  2 minutes  Alternating LE  Rocker board  5 minutes   Toe taps on step  14 step x 3 minutes  Alternating every 5 reps   Tandem walking on foam  2 minutes With BUE support from parallel bars  Side stepping on foam  2 minutes   Cybex knee flexion 30# x 3 minutes    Cybex knee extension 10# x 2.5 minutes    Blank cell = exercise not performed today                                    11/01/23 EXERCISE LOG  Exercise Repetitions and Resistance Comments  Nustep  L5 x 17 minutes   Static stance on foam 4.5 minutes Intermittent upper extremity support with dual tasking  Marching on foam 4 minutes With bilateral upper extremity support from parallel bars  Tandem walking on foam 4 minutes With bilateral upper extremity support from parallel bars  Sidestepping on  foam 4 minutes Intermittent upper extremity support from parallel bars  Rocker board X 4 mins    Blank cell = exercise not performed today  SIT to stand   40.31 secs TUG   22.89 secs  PATIENT EDUCATION:  Education details: safety and exercising Person educated: Patient Education method: Explanation Education comprehension: verbalized understanding  HOME EXERCISE PROGRAM:   ASSESSMENT:  CLINICAL IMPRESSION:  Patient arrives for today's treatment session reporting minimal pain but report increased fatigue today due to a cold. Pt able to tolerate increased time with all cybex exercises today. Pt also able to tolerate increased time with all balances exercises as well.  Pt given seated and standing rest breaks as needed due to fatigue.  Pt denied any pain at completion of today's treatment session.   OBJECTIVE IMPAIRMENTS: Abnormal gait, decreased activity tolerance, decreased balance, decreased mobility, difficulty walking, decreased strength, and pain.   ACTIVITY LIMITATIONS: carrying, lifting, bending, standing, stairs, transfers, bathing, and locomotion level  PARTICIPATION LIMITATIONS: meal prep, cleaning, laundry, shopping, community  activity, and yard work  PERSONAL FACTORS: Age, Past/current experiences, Time since onset of injury/illness/exacerbation, and 3+ comorbidities: Type 2 diabetes, angina, hypertension, COPD, history of cancer, history of a stroke, history of a myocardial infarction, tremors, arthritis, and depression are also affecting patient's functional outcome.   REHAB POTENTIAL: Fair    CLINICAL DECISION MAKING: Evolving/moderate complexity  EVALUATION COMPLEXITY: Moderate   GOALS: Goals reviewed with patient? Yes  SHORT TERM GOALS: Target date: 10/03/23 Patient will be independent with his initial HEP. Baseline: Goal status: MET  2.  Patient will improve his timed up and go time to 19 seconds or less for improved functional mobility. Baseline: 5/16: 18.6 seconds Goal status: MET  3.  Patient will improve his 5 times sit to stand time to 45 seconds or less for improved lower extremity power. Baseline: 5/16: 42.6 seconds Goal status: MET  LONG TERM GOALS: Target date: 10/24/23  Patient will be independent with his advanced HEP. Baseline: I have not been doing my exercises much Goal status: IN PROGRESS  2.  Patient will be able to safely pick up items from the floor for improved household independence. Baseline:  Goal status: MET  3.  Patient will improve his 5 times sit to stand time to 30 seconds or less for improved lower extremity power. Baseline: 47.02 seconds on 10/21/23    11-01-23  40.31 secs Goal status: In Progress    4.  Patient will improve his timed up and go time to 15 seconds or less to reduce his fall risk. Baseline: 26.82 seconds on 10/21/23   11-01-23  22.89 secs Goal status: IN PROGRESS  5.  Patient will improve his 30 second sit to stand test to at least 5 repetitions for improved lower extremity power needed for transfers. Baseline: 3 reps on 10/21/23 Goal status: IN PROGRESS  PLAN:  PT FREQUENCY: 2x/week  PT DURATION: 6 weeks  PLANNED INTERVENTIONS: [id number]- PT  Re-evaluation, 97750- Physical Performance Testing, 97110-Therapeutic exercises, 97530- Therapeutic activity, 97112- Neuromuscular re-education, 97535- Self Care, [id number]- Manual therapy, [phone]- Gait training, Patient/Family education, Balance training, and Stair training  PLAN FOR NEXT SESSION:   Nustep, lower extremity strengthening, balance interventions, and gait training   Delon DELENA Gosling, PTA 11/22/2023, 12:00 PM

## 2023-11-24 ENCOUNTER — Ambulatory Visit: Admitting: Physical Therapy

## 2023-11-24 DIAGNOSIS — R2681 Unsteadiness on feet: Secondary | ICD-10-CM | POA: Diagnosis not present

## 2023-11-24 DIAGNOSIS — M6281 Muscle weakness (generalized): Secondary | ICD-10-CM

## 2023-11-24 NOTE — Therapy (Signed)
 [facility] LOWER EXTREMITY TREATMENT   Patient Name: Andrew [name] MRN: [id number] DOB:1951/04/17, 73 y.o., male Today's Date: 11/24/2023  END OF SESSION:  PT End of Session - 11/24/23 1447     Visit Number 17    Number of Visits 18    Date for PT Re-Evaluation 12/02/23    PT Start Time 0230    PT Stop Time 0319    PT Time Calculation (min) 49 min    Activity Tolerance Patient tolerated treatment well    Behavior During Therapy [facility] for tasks assessed/performed                Past Medical History:  Diagnosis Date   Acute viral pericarditis 09/03/2018   Inferior STE - but Negative Troponin.  CP &SOB.  Felt to be Viral.   Anemia    Arthritis    Bladder cancer (HCC) 2014   CAD (coronary artery disease)    Cataract    bilateral   Complication of anesthesia    Depression    Diabetes mellitus without complication (HCC)    Essential hypertension 01/20/2018   in the past no longer on medication   Family history of breast cancer    Family history of prostate cancer    Family history of testicular cancer    GERD (gastroesophageal reflux disease)    Heart attack (HCC) 03/2022   Hx of adenomatous colonic polyps 09/2017   colonoscopy   Hyperlipidemia with target LDL less than 70 12/19/2015   Intraoperative floppy iris syndrome (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 DES PCI RI, PTCA of RPDA 01/21/2018   12/2017 - Cath for ? STEMI -> distal/apical LAD & m-dCx CTO (unable to cross Cx).  Mod rPDA. Severe RI - DES PCI.  08/2018 - Cath for ? Inf STEMI - RI stent patent & CTO dLAD/mCx. Progression of rPDA 95% -> PTCA only.  Thought to be Pericarditis & not MI (troponin negative).    Neuromuscular disorder (HCC)    Polyposis of colon 01/25/2023   Sleep apnea    BiPAP not currently being used   Status post tendon repair 1989   STEMI (ST elevation myocardial infarction) (HCC) 01/20/2018   Cardiac cath January 21, 2018: Severe  multivessel disease with unclear lesion, but opted for PCI/DES x1 to the RI.  Appeared to have CTO of the distal LAD and circumflex as well as moderate mid LAD and diagonal disease as well as PDA.[facility]  Unable to cross circumflex lesion.   STEMI (ST elevation myocardial infarction) (HCC) 03/2022   Stroke (HCC) 11/2015   At times pt has dizziness with loss of vision   Tremors of nervous system 2011   Past Surgical History:  Procedure Laterality Date   APPENDECTOMY     BLADDER SURGERY     COLONOSCOPY     CORONARY STENT INTERVENTION N/A 01/21/2018   Procedure: CORONARY STENT INTERVENTION;  Surgeon: Anner Alm ORN, MD;  Location: [facility] INVASIVE CV LAB;  Service: Cardiovascular;  Laterality: N/A;  95% Ramus Intermedius -PCI with synergy DES 2.25 mm x 12 mm postdilated 2.4 mm.   CORONARY/GRAFT ACUTE MI REVASCULARIZATION N/A 01/21/2018   Procedure: Coronary/Graft Acute MI Revascularization;  Surgeon: Anner Alm ORN, MD;  Location: [facility] INVASIVE CV LAB;  Service: Cardiovascular;  Laterality: N/A;  attempted revas to distal CFX;    CORONARY/GRAFT ACUTE MI REVASCULARIZATION N/A 09/04/2018   Procedure: Coronary/Graft Acute MI Revascularization;  Surgeon:  Wonda Sharper, MD;  Location: [facility] INVASIVE CV LAB:: PTCA/POBA of rPDA (2.0 mm balloon)   CYSTOSCOPY N/A 01/21/2020   Procedure: CYSTOSCOPY;  Surgeon: Sherrilee Belvie CROME, MD;  Location: [facility];  Service: Urology;  Laterality: N/A;   CYSTOSCOPY N/A 02/09/2021   Procedure: CYSTOSCOPY;  Surgeon: Sherrilee Belvie CROME, MD;  Location: [facility];  Service: Urology;  Laterality: N/A;   CYSTOSCOPY Left 11/08/2022   Procedure: CYSTOSCOPY;  Surgeon: Sherrilee Belvie CROME, MD;  Location: [facility];  Service: Urology;  Laterality: Left;   CYSTOSCOPY W/ RETROGRADES Bilateral 08/22/2015   Procedure: CYSTOSCOPY WITH RETROGRADE PYELOGRAM;  Surgeon: Norleen Seltzer, MD;  Location: [facility];  Service: Urology;  Laterality: Bilateral;   CYSTOSCOPY W/ RETROGRADES Bilateral 01/16/2016   Procedure:  CYSTOSCOPY WITH RETROGRADE PYELOGRAM;  Surgeon: Norleen Seltzer, MD;  Location: [facility];  Service: Urology;  Laterality: Bilateral;   CYSTOSCOPY W/ RETROGRADES Bilateral 01/07/2017   Procedure: CYSTOSCOPY WITH RETROGRADE PYELOGRAM;  Surgeon: Seltzer Norleen, MD;  Location: [facility];  Service: Urology;  Laterality: Bilateral;   CYSTOSCOPY WITH BIOPSY N/A 08/22/2015   Procedure: CYSTOSCOPY WITH BLADDER BIOPSY;  Surgeon: Norleen Seltzer, MD;  Location: [facility];  Service: Urology;  Laterality: N/A;   CYSTOSCOPY WITH BIOPSY N/A 01/16/2016   Procedure: CYSTOSCOPY WITH BLADDER BIOPSY;  Surgeon: Norleen Seltzer, MD;  Location: [facility];  Service: Urology;  Laterality: N/A;   CYSTOSCOPY WITH FULGERATION N/A 01/16/2016   Procedure: CYSTOSCOPY WITH FULGERATION;  Surgeon: Norleen Seltzer, MD;  Location: [facility];  Service: Urology;  Laterality: N/A;   CYSTOSCOPY/RETROGRADE/URETEROSCOPY Left 09/06/2022   Procedure: CYSTOSCOPY/RETROGRADE/URETEROSCOPY/STENT PLACEMENT;  Surgeon: Sherrilee Belvie CROME, MD;  Location: [facility];  Service: Urology;  Laterality: Left;   KNEE ARTHROSCOPY Right 1989   LEFT HEART CATH AND CORONARY ANGIOGRAPHY N/A 01/21/2018   Procedure: LEFT HEART CATH AND CORONARY ANGIOGRAPHY;  Surgeon: Anner Alm ORN, MD;  Location: [facility] INVASIVE CV LAB;  Service: Cardiovascular;; 95% RI-DES PCI.  80% o-pCX (PTCA) -> dCX 80%-100% CTO (unsuccessful PTCA unable to cross distal lesion with balloon.)  100% CTO dLAD. RPDA 80% and 70% as well as P AV 180%, PA V2 60%.  (too small for PCI)   LEFT HEART CATH AND CORONARY ANGIOGRAPHY N/A 09/04/2018   Procedure: LEFT HEART CATH AND CORONARY ANGIOGRAPHY;  Surgeon: Wonda Sharper, MD;  Location: [facility] INVASIVE CV LAB: (presumed Inferior STEMI) = progression of small rPDA -95% (PTCA only). Patent RI stent. CTO of apical LAD & mCx.   ROBOT ASSITED LAPAROSCOPIC NEPHROURETERECTOMY Left 11/08/2022   Procedure: XI ROBOT ASSITED LAPAROSCOPIC NEPHROURETERECTOMY;  Surgeon: Sherrilee Belvie CROME, MD;  Location: [facility];   Service: Urology;  Laterality: Left;   ROTATOR CUFF REPAIR Bilateral 2000   rt. leg fracture surgery     TRANSTHORACIC ECHOCARDIOGRAM  01/21/2018   Mild LVH.  EF 50 and 55%.  Apical inferior hypokinesis.  Mid-apical anterolateral hypokinesis.  Normal diastolic function for age    TRANSTHORACIC ECHOCARDIOGRAM  09/04/2018   -? Inf STEMI vs. Pericarditis:  Mildly reduced EF of 45 to 50%.  Impaired relaxation-GR 1 DD.  Severe hypokinesis of the anterolateral and inferolateral wall.  Small-moderate anterior pericardial effusion.  Moderate aortic sclerosis but no stenosis.   TRANSURETHRAL RESECTION OF BLADDER TUMOR N/A 01/07/2017   Procedure: TRANSURETHRAL RESECTION OF BLADDER TUMOR (TURBT);  Surgeon: Seltzer Norleen, MD;  Location: [facility];  Service: Urology;  Laterality: N/A;   TRANSURETHRAL RESECTION OF BLADDER TUMOR N/A 01/21/2020   Procedure: TRANSURETHRAL RESECTION OF BLADDER TUMOR (TURBT);  Surgeon:  McKenzie, Belvie CROME, MD;  Location: [facility];  Service: Urology;  Laterality: N/A;   TRANSURETHRAL RESECTION OF BLADDER TUMOR N/A 02/09/2021   Procedure: TRANSURETHRAL RESECTION OF BLADDER TUMOR (TURBT);  Surgeon: Sherrilee Belvie CROME, MD;  Location: [facility];  Service: Urology;  Laterality: N/A;   URETERAL BIOPSY Left 09/06/2022   Procedure: URETERAL BIOPSY/fulgeration;  Surgeon: Sherrilee Belvie CROME, MD;  Location: [facility];  Service: Urology;  Laterality: Left;   WISDOM TOOTH EXTRACTION     Patient Active Problem List   Diagnosis Date Noted   Epidermoid metaplasia esophagus 07/19/2023   Barrett's esophagus without dysplasia 07/19/2023   Abnormal endoscopy of upper gastrointestinal tract 07/19/2023   Genetic testing 02/10/2023   Polyposis of colon 01/25/2023   Family history of prostate cancer    Family history of testicular cancer    Family history of breast cancer    Ureteral cancer, left (HCC) 11/08/2022   Left renal mass 09/06/2022   History of pericarditis 04/26/2019   History of ST elevation  myocardial infarction (STEMI) 09/05/2018   Unstable angina (HCC) 09/04/2018   Presence of drug coated stent in ramus intermedius coronary artery 05/12/2018   COPD with acute exacerbation (HCC) 01/21/2018   Multivessel CAD - CTO dLAD, mCx. DES PCI RI, PTCA of RPDA 01/21/2018   Stable angina (HCC) 01/20/2018   Depression 01/20/2018   Essential hypertension 01/20/2018   Sleep apnea 10/06/2016   Tobacco use disorder 12/19/2015   Type 2 diabetes mellitus with circulatory disorder, without long-term current use of insulin  (HCC) 12/19/2015   Hyperlipidemia with target LDL less than 70 12/19/2015   Bladder cancer (HCC) 12/19/2015   Esophageal reflux 12/19/2015   Essential tremor 12/19/2015   History of stroke 12/19/2015    PCP: Gladystine Erminio CROME, MD   REFERRING PROVIDER: Gladystine Erminio CROME, MD   REFERRING DIAG: poor balance  THERAPY DIAG:  Unsteadiness on feet  Muscle weakness (generalized)  Rationale for Evaluation and Treatment: Rehabilitation  ONSET DATE: Winter 2024  SUBJECTIVE:   SUBJECTIVE STATEMENT: Patient reports that he feels alright today. He has not had any problems since his last appointment.   PERTINENT HISTORY: Type 2 diabetes, angina, hypertension, COPD, history of cancer, history of a stroke, history of a myocardial infarction, tremors, arthritis, and depression PAIN:  Are you having pain? Yes: NPRS scale: no pain score provided  Pain location: back, shoulders, and knees Pain description: aching, sore   PRECAUTIONS: Fall  RED FLAGS: None   WEIGHT BEARING RESTRICTIONS: No  FALLS:  Has patient fallen in last 6 months? Yes. Number of falls 2  LIVING ENVIRONMENT: Lives with: lives alone Lives in: House/apartment Stairs: Yes: Internal: 1 steps; none Has following equipment at home: Ramped entry  OCCUPATION: retired  PLOF: Independent with basic ADLs  PATIENT GOALS: improved safety, be able to use his chainsaw, improved strength, and be able to  pick up things off the floor  NEXT MD VISIT: unsure  OBJECTIVE:  Note: Objective measures were completed at Evaluation unless otherwise noted.  COGNITION: Overall cognitive status: Within functional limits for tasks assessed     SENSATION: Light touch: no significant deficits noted Patient reports numbness in both legs with the left being worse and more frequent than the right  LOWER EXTREMITY ROM: WFL for activities assessed  LOWER EXTREMITY MMT:  MMT Right eval Left eval  Hip flexion 4-/5; hip pain 3+/5  Hip extension    Hip abduction    Hip adduction    Hip internal  rotation    Hip external rotation    Knee flexion 4/5 5/5  Knee extension 3+/5 5/5  Ankle dorsiflexion 3/5 3/5  Ankle plantarflexion    Ankle inversion    Ankle eversion     (Blank rows = not tested)  FUNCTIONAL TESTS:  5 times sit to stand: 1:05 with BUE support from armrests Timed up and go (TUG): 24.17 seconds 30 second sit to stand: 3 reps  GAIT: Assistive device utilized: None Level of assistance: Complete Independence Comments: Decreased gait speed with poor lateral stability and foot clearance bilaterally                                                                                                                                TREATMENT DATE:    11/24/23:                                     EXERCISE LOG  Exercise Repetitions and Resistance Comments  Nustep Level 5 x 17 minutes   Rockerboard 5 minutes   Balance beam In parallel x 5 minutes   Knee ext 10# x 4 minutes    Ham curls 40# x 4 minutes    Leg press  2 plates x 4 minutes                                           11/22/23 EXERCISE LOG  Exercise Repetitions and Resistance Comments  Nustep  L5 x 17 minutes   Cybex Knee Flexion 30# x 4 mins   Cybex Knee Extension 10# x 4 mins   Rocker board  5 mins BUE support from parallel bars  Tandem walking on balance beam 5 mins Intermittent UE support from parallel bars  Side  stepping balance beam  5 mins Intermittent UE support from parallel bars (forward/backward)  Seated clams     Seated hip ADD isometric     Toe taps on step     Lunges on BOSU     Sit to stand  2 x 10 reps     Blank cell = exercise not performed today                                    11/08/23 EXERCISE LOG  Exercise Repetitions and Resistance Comments  Nustep  L5 x 17 minutes   Standing hip ABD  2 minutes Alternating LE  Rocker board  5 minutes   Toe taps on step  14 step x 3 minutes  Alternating every 5 reps   Tandem walking on foam  2 minutes With BUE support from parallel bars  Side stepping on foam  2 minutes  Cybex knee flexion 30# x 3 minutes    Cybex knee extension 10# x 2.5 minutes    Blank cell = exercise not performed today                                    11/01/23 EXERCISE LOG  Exercise Repetitions and Resistance Comments  Nustep  L5 x 17 minutes   Static stance on foam 4.5 minutes Intermittent upper extremity support with dual tasking  Marching on foam 4 minutes With bilateral upper extremity support from parallel bars  Tandem walking on foam 4 minutes With bilateral upper extremity support from parallel bars  Sidestepping on foam 4 minutes Intermittent upper extremity support from parallel bars  Rocker board X 4 mins    Blank cell = exercise not performed today  SIT to stand   40.31 secs TUG   22.89 secs  PATIENT EDUCATION:  Education details: safety and exercising Person educated: Patient Education method: Explanation Education comprehension: verbalized understanding  HOME EXERCISE PROGRAM:   ASSESSMENT:  CLINICAL IMPRESSION:  Patient did very well today which included addition of the leg press which he performed with excellent technique and without complaint.    OBJECTIVE IMPAIRMENTS: Abnormal gait, decreased activity tolerance, decreased balance, decreased mobility, difficulty walking, decreased strength, and pain.   ACTIVITY LIMITATIONS: carrying,  lifting, bending, standing, stairs, transfers, bathing, and locomotion level  PARTICIPATION LIMITATIONS: meal prep, cleaning, laundry, shopping, community activity, and yard work  PERSONAL FACTORS: Age, Past/current experiences, Time since onset of injury/illness/exacerbation, and 3+ comorbidities: Type 2 diabetes, angina, hypertension, COPD, history of cancer, history of a stroke, history of a myocardial infarction, tremors, arthritis, and depression are also affecting patient's functional outcome.   REHAB POTENTIAL: Fair    CLINICAL DECISION MAKING: Evolving/moderate complexity  EVALUATION COMPLEXITY: Moderate   GOALS: Goals reviewed with patient? Yes  SHORT TERM GOALS: Target date: 10/03/23 Patient will be independent with his initial HEP. Baseline: Goal status: MET  2.  Patient will improve his timed up and go time to 19 seconds or less for improved functional mobility. Baseline: 5/16: 18.6 seconds Goal status: MET  3.  Patient will improve his 5 times sit to stand time to 45 seconds or less for improved lower extremity power. Baseline: 5/16: 42.6 seconds Goal status: MET  LONG TERM GOALS: Target date: 10/24/23  Patient will be independent with his advanced HEP. Baseline: I have not been doing my exercises much Goal status: IN PROGRESS  2.  Patient will be able to safely pick up items from the floor for improved household independence. Baseline:  Goal status: MET  3.  Patient will improve his 5 times sit to stand time to 30 seconds or less for improved lower extremity power. Baseline: 47.02 seconds on 10/21/23    11-01-23  40.31 secs Goal status: In Progress    4.  Patient will improve his timed up and go time to 15 seconds or less to reduce his fall risk. Baseline: 26.82 seconds on 10/21/23   11-01-23  22.89 secs Goal status: IN PROGRESS  5.  Patient will improve his 30 second sit to stand test to at least 5 repetitions for improved lower extremity power needed for  transfers. Baseline: 3 reps on 10/21/23 Goal status: IN PROGRESS  PLAN:  PT FREQUENCY: 2x/week  PT DURATION: 6 weeks  PLANNED INTERVENTIONS: 97164- PT Re-evaluation, 97750- Physical Performance Testing, 97110-Therapeutic exercises, 97530- Therapeutic activity,  02887- Neuromuscular re-education, [id number]- Self Care, [id number]- Manual therapy, [phone]- Gait training, Patient/Family education, Balance training, and Stair training  PLAN FOR NEXT SESSION:   Nustep, lower extremity strengthening, balance interventions, and gait training   Caydin Yeatts, [place], PT 11/24/2023, 3:31 PM

## 2023-11-29 ENCOUNTER — Ambulatory Visit: Admitting: Physical Therapy

## 2023-11-29 DIAGNOSIS — R2681 Unsteadiness on feet: Secondary | ICD-10-CM | POA: Diagnosis not present

## 2023-11-29 DIAGNOSIS — M6281 Muscle weakness (generalized): Secondary | ICD-10-CM

## 2023-11-29 NOTE — Therapy (Signed)
 [facility] LOWER EXTREMITY TREATMENT   Patient Name: Andrew [name] MRN: [id number] DOB:1951-04-23, 73 y.o., male Today's Date: 11/29/2023  END OF SESSION:          Past Medical History:  Diagnosis Date   Acute viral pericarditis 09/03/2018   Inferior STE - but Negative Troponin.  CP &SOB.  Felt to be Viral.   Anemia    Arthritis    Bladder cancer (HCC) 2014   CAD (coronary artery disease)    Cataract    bilateral   Complication of anesthesia    Depression    Diabetes mellitus without complication (HCC)    Essential hypertension 01/20/2018   in the past no longer on medication   Family history of breast cancer    Family history of prostate cancer    Family history of testicular cancer    GERD (gastroesophageal reflux disease)    Heart attack (HCC) 03/2022   Hx of adenomatous colonic polyps 09/2017   colonoscopy   Hyperlipidemia with target LDL less than 70 12/19/2015   Intraoperative floppy iris syndrome (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 DES PCI RI, PTCA of RPDA 01/21/2018   12/2017 - Cath for ? STEMI -> distal/apical LAD & m-dCx CTO (unable to cross Cx).  Mod rPDA. Severe RI - DES PCI.  08/2018 - Cath for ? Inf STEMI - RI stent patent & CTO dLAD/mCx. Progression of rPDA 95% -> PTCA only.  Thought to be Pericarditis & not MI (troponin negative).    Neuromuscular disorder (HCC)    Polyposis of colon 01/25/2023   Sleep apnea    BiPAP not currently being used   Status post tendon repair 1989   STEMI (ST elevation myocardial infarction) (HCC) 01/20/2018   Cardiac cath January 21, 2018: Severe multivessel disease with unclear lesion, but opted for PCI/DES x1 to the RI.  Appeared to have CTO of the distal LAD and circumflex as well as moderate mid LAD and diagonal disease as well as PDA.[facility]  Unable to cross circumflex lesion.   STEMI (ST elevation myocardial infarction) (HCC) 03/2022   Stroke (HCC) 11/2015   At  times pt has dizziness with loss of vision   Tremors of nervous system 2011   Past Surgical History:  Procedure Laterality Date   APPENDECTOMY     BLADDER SURGERY     COLONOSCOPY     CORONARY STENT INTERVENTION N/A 01/21/2018   Procedure: CORONARY STENT INTERVENTION;  Surgeon: Anner Alm ORN, MD;  Location: [facility] INVASIVE CV LAB;  Service: Cardiovascular;  Laterality: N/A;  95% Ramus Intermedius -PCI with synergy DES 2.25 mm x 12 mm postdilated 2.4 mm.   CORONARY/GRAFT ACUTE MI REVASCULARIZATION N/A 01/21/2018   Procedure: Coronary/Graft Acute MI Revascularization;  Surgeon: Anner Alm ORN, MD;  Location: [facility] INVASIVE CV LAB;  Service: Cardiovascular;  Laterality: N/A;  attempted revas to distal CFX;    CORONARY/GRAFT ACUTE MI REVASCULARIZATION N/A 09/04/2018   Procedure: Coronary/Graft Acute MI Revascularization;  Surgeon: Wonda Sharper, MD;  Location: [facility] INVASIVE CV LAB:: PTCA/POBA of rPDA (2.0 mm balloon)   CYSTOSCOPY N/A 01/21/2020   Procedure: CYSTOSCOPY;  Surgeon: Sherrilee Belvie CROME, MD;  Location: [facility];  Service: Urology;  Laterality: N/A;   CYSTOSCOPY N/A 02/09/2021   Procedure: CYSTOSCOPY;  Surgeon: Sherrilee Belvie CROME, MD;  Location: [facility];  Service: Urology;  Laterality: N/A;   CYSTOSCOPY Left 11/08/2022   Procedure: CYSTOSCOPY;  Surgeon: Sherrilee Belvie  L, MD;  Location: [facility];  Service: Urology;  Laterality: Left;   CYSTOSCOPY W/ RETROGRADES Bilateral 08/22/2015   Procedure: CYSTOSCOPY WITH RETROGRADE PYELOGRAM;  Surgeon: Norleen Seltzer, MD;  Location: [facility];  Service: Urology;  Laterality: Bilateral;   CYSTOSCOPY W/ RETROGRADES Bilateral 01/16/2016   Procedure: CYSTOSCOPY WITH RETROGRADE PYELOGRAM;  Surgeon: Norleen Seltzer, MD;  Location: [facility];  Service: Urology;  Laterality: Bilateral;   CYSTOSCOPY W/ RETROGRADES Bilateral 01/07/2017   Procedure: CYSTOSCOPY WITH RETROGRADE PYELOGRAM;  Surgeon: Seltzer Norleen, MD;  Location: [facility];  Service: Urology;  Laterality: Bilateral;    CYSTOSCOPY WITH BIOPSY N/A 08/22/2015   Procedure: CYSTOSCOPY WITH BLADDER BIOPSY;  Surgeon: Norleen Seltzer, MD;  Location: [facility];  Service: Urology;  Laterality: N/A;   CYSTOSCOPY WITH BIOPSY N/A 01/16/2016   Procedure: CYSTOSCOPY WITH BLADDER BIOPSY;  Surgeon: Norleen Seltzer, MD;  Location: [facility];  Service: Urology;  Laterality: N/A;   CYSTOSCOPY WITH FULGERATION N/A 01/16/2016   Procedure: CYSTOSCOPY WITH FULGERATION;  Surgeon: Norleen Seltzer, MD;  Location: [facility];  Service: Urology;  Laterality: N/A;   CYSTOSCOPY/RETROGRADE/URETEROSCOPY Left 09/06/2022   Procedure: CYSTOSCOPY/RETROGRADE/URETEROSCOPY/STENT PLACEMENT;  Surgeon: Sherrilee Belvie CROME, MD;  Location: [facility];  Service: Urology;  Laterality: Left;   KNEE ARTHROSCOPY Right 1989   LEFT HEART CATH AND CORONARY ANGIOGRAPHY N/A 01/21/2018   Procedure: LEFT HEART CATH AND CORONARY ANGIOGRAPHY;  Surgeon: Anner Alm ORN, MD;  Location: [facility] INVASIVE CV LAB;  Service: Cardiovascular;; 95% RI-DES PCI.  80% o-pCX (PTCA) -> dCX 80%-100% CTO (unsuccessful PTCA unable to cross distal lesion with balloon.)  100% CTO dLAD. RPDA 80% and 70% as well as P AV 180%, PA V2 60%.  (too small for PCI)   LEFT HEART CATH AND CORONARY ANGIOGRAPHY N/A 09/04/2018   Procedure: LEFT HEART CATH AND CORONARY ANGIOGRAPHY;  Surgeon: Wonda Sharper, MD;  Location: [facility] INVASIVE CV LAB: (presumed Inferior STEMI) = progression of small rPDA -95% (PTCA only). Patent RI stent. CTO of apical LAD & mCx.   ROBOT ASSITED LAPAROSCOPIC NEPHROURETERECTOMY Left 11/08/2022   Procedure: XI ROBOT ASSITED LAPAROSCOPIC NEPHROURETERECTOMY;  Surgeon: Sherrilee Belvie CROME, MD;  Location: [facility];  Service: Urology;  Laterality: Left;   ROTATOR CUFF REPAIR Bilateral 2000   rt. leg fracture surgery     TRANSTHORACIC ECHOCARDIOGRAM  01/21/2018   Mild LVH.  EF 50 and 55%.  Apical inferior hypokinesis.  Mid-apical anterolateral hypokinesis.  Normal diastolic function for age    TRANSTHORACIC ECHOCARDIOGRAM   09/04/2018   -? Inf STEMI vs. Pericarditis:  Mildly reduced EF of 45 to 50%.  Impaired relaxation-GR 1 DD.  Severe hypokinesis of the anterolateral and inferolateral wall.  Small-moderate anterior pericardial effusion.  Moderate aortic sclerosis but no stenosis.   TRANSURETHRAL RESECTION OF BLADDER TUMOR N/A 01/07/2017   Procedure: TRANSURETHRAL RESECTION OF BLADDER TUMOR (TURBT);  Surgeon: Seltzer Norleen, MD;  Location: [facility];  Service: Urology;  Laterality: N/A;   TRANSURETHRAL RESECTION OF BLADDER TUMOR N/A 01/21/2020   Procedure: TRANSURETHRAL RESECTION OF BLADDER TUMOR (TURBT);  Surgeon: Sherrilee Belvie CROME, MD;  Location: [facility];  Service: Urology;  Laterality: N/A;   TRANSURETHRAL RESECTION OF BLADDER TUMOR N/A 02/09/2021   Procedure: TRANSURETHRAL RESECTION OF BLADDER TUMOR (TURBT);  Surgeon: Sherrilee Belvie CROME, MD;  Location: [facility];  Service: Urology;  Laterality: N/A;   URETERAL BIOPSY Left 09/06/2022   Procedure: URETERAL BIOPSY/fulgeration;  Surgeon: Sherrilee Belvie CROME, MD;  Location: [facility];  Service: Urology;  Laterality: Left;   WISDOM  TOOTH EXTRACTION     Patient Active Problem List   Diagnosis Date Noted   Epidermoid metaplasia esophagus 07/19/2023   Barrett's esophagus without dysplasia 07/19/2023   Abnormal endoscopy of upper gastrointestinal tract 07/19/2023   Genetic testing 02/10/2023   Polyposis of colon 01/25/2023   Family history of prostate cancer    Family history of testicular cancer    Family history of breast cancer    Ureteral cancer, 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 DES PCI RI, PTCA of RPDA 01/21/2018   Stable angina (HCC) 01/20/2018    Depression 01/20/2018   Essential hypertension 01/20/2018   Sleep apnea 10/06/2016   Tobacco use disorder 12/19/2015   Type 2 diabetes mellitus with circulatory disorder, without long-term current use of insulin  (HCC) 12/19/2015   Hyperlipidemia with target LDL less than 70 12/19/2015   Bladder cancer (HCC) 12/19/2015   Esophageal reflux 12/19/2015   Essential tremor 12/19/2015   History of stroke 12/19/2015    PCP: Gladystine Erminio CROME, MD   REFERRING PROVIDER: Gladystine Erminio CROME, MD   REFERRING DIAG: poor balance  THERAPY DIAG:  No diagnosis found.  Rationale for Evaluation and Treatment: Rehabilitation  ONSET DATE: Winter 2024  SUBJECTIVE:   SUBJECTIVE STATEMENT: Patient reports that he feels alright today. He has not had any problems since his last appointment.   PERTINENT HISTORY: Type 2 diabetes, angina, hypertension, COPD, history of cancer, history of a stroke, history of a myocardial infarction, tremors, arthritis, and depression PAIN:  Are you having pain? Yes: NPRS scale: no pain score provided  Pain location: back, shoulders, and knees Pain description: aching, sore   PRECAUTIONS: Fall  RED FLAGS: None   WEIGHT BEARING RESTRICTIONS: No  FALLS:  Has patient fallen in last 6 months? Yes. Number of falls 2  LIVING ENVIRONMENT: Lives with: lives alone Lives in: House/apartment Stairs: Yes: Internal: 1 steps; none Has following equipment at home: Ramped entry  OCCUPATION: retired  PLOF: Independent with basic ADLs  PATIENT GOALS: improved safety, be able to use his chainsaw, improved strength, and be able to pick up things off the floor  NEXT MD VISIT: unsure  OBJECTIVE:  Note: Objective measures were completed at Evaluation unless otherwise noted.  COGNITION: Overall cognitive status: Within functional limits for tasks assessed     SENSATION: Light touch: no significant deficits noted Patient reports numbness in both legs with the left being  worse and more frequent than the right  LOWER EXTREMITY ROM: WFL for activities assessed  LOWER EXTREMITY MMT:  MMT Right eval Left eval  Hip flexion 4-/5; hip pain 3+/5  Hip extension    Hip abduction    Hip adduction    Hip internal rotation    Hip external rotation    Knee flexion 4/5 5/5  Knee extension 3+/5 5/5  Ankle dorsiflexion 3/5 3/5  Ankle plantarflexion    Ankle inversion    Ankle eversion     (Blank rows = not tested)  FUNCTIONAL TESTS:  5 times sit to stand: 1:05 with BUE support from armrests Timed up and go (TUG): 24.17 seconds 30 second sit to stand: 3 reps  GAIT: Assistive device utilized: None Level  of assistance: Complete Independence Comments: Decreased gait speed with poor lateral stability and foot clearance bilaterally                                                                                                                                TREATMENT DATE:    11/24/23:                                     EXERCISE LOG  Exercise Repetitions and Resistance Comments  Nustep Level 5 x 20 minutes   Knee ext 10# x 4 minutes    Ham curls  40# x 4 minutes            Goal re-assessment performed                                      EXERCISE LOG  Exercise Repetitions and Resistance Comments  Nustep Level 5 x 17 minutes   Rockerboard 5 minutes   Balance beam In parallel x 5 minutes   Knee ext 10# x 4 minutes    Ham curls 40# x 4 minutes    Leg press  2 plates x 4 minutes                                           11/22/23 EXERCISE LOG  Exercise Repetitions and Resistance Comments  Nustep  L5 x 17 minutes   Cybex Knee Flexion 30# x 4 mins   Cybex Knee Extension 10# x 4 mins   Rocker board  5 mins BUE support from parallel bars  Tandem walking on balance beam 5 mins Intermittent UE support from parallel bars  Side stepping balance beam  5 mins Intermittent UE support from parallel bars (forward/backward)  Seated clams     Seated hip ADD  isometric     Toe taps on step     Lunges on BOSU     Sit to stand  2 x 10 reps     Blank cell = exercise not performed today                                    11/08/23 EXERCISE LOG  Exercise Repetitions and Resistance Comments  Nustep  L5 x 17 minutes   Standing hip ABD  2 minutes Alternating LE  Rocker board  5 minutes   Toe taps on step  14 step x 3 minutes  Alternating every 5 reps   Tandem walking on foam  2 minutes With BUE support from parallel bars  Side stepping on foam  2 minutes  Cybex knee flexion 30# x 3 minutes    Cybex knee extension 10# x 2.5 minutes    Blank cell = exercise not performed today                                    11/01/23 EXERCISE LOG  Exercise Repetitions and Resistance Comments  Nustep  L5 x 17 minutes   Static stance on foam 4.5 minutes Intermittent upper extremity support with dual tasking  Marching on foam 4 minutes With bilateral upper extremity support from parallel bars  Tandem walking on foam 4 minutes With bilateral upper extremity support from parallel bars  Sidestepping on foam 4 minutes Intermittent upper extremity support from parallel bars  Rocker board X 4 mins    Blank cell = exercise not performed today  SIT to stand   40.31 secs TUG   22.89 secs  PATIENT EDUCATION:  Education details: safety and exercising Person educated: Patient Education method: Explanation Education comprehension: verbalized understanding  HOME EXERCISE PROGRAM:   ASSESSMENT:  CLINICAL IMPRESSION:  See below.  OBJECTIVE IMPAIRMENTS: Abnormal gait, decreased activity tolerance, decreased balance, decreased mobility, difficulty walking, decreased strength, and pain.   ACTIVITY LIMITATIONS: carrying, lifting, bending, standing, stairs, transfers, bathing, and locomotion level  PARTICIPATION LIMITATIONS: meal prep, cleaning, laundry, shopping, community activity, and yard work  PERSONAL FACTORS: Age, Past/current experiences, Time since onset of  injury/illness/exacerbation, and 3+ comorbidities: Type 2 diabetes, angina, hypertension, COPD, history of cancer, history of a stroke, history of a myocardial infarction, tremors, arthritis, and depression are also affecting patient's functional outcome.   REHAB POTENTIAL: Fair    CLINICAL DECISION MAKING: Evolving/moderate complexity  EVALUATION COMPLEXITY: Moderate   GOALS: Goals reviewed with patient? Yes  SHORT TERM GOALS: Target date: 10/03/23 Patient will be independent with his initial HEP. Baseline: Goal status: MET  2.  Patient will improve his timed up and go time to 19 seconds or less for improved functional mobility. Baseline: 5/16: 18.6 seconds Goal status: MET  3.  Patient will improve his 5 times sit to stand time to 45 seconds or less for improved lower extremity power. Baseline: 5/16: 42.6 seconds Goal status: MET  LONG TERM GOALS: Target date: 10/24/23  Patient will be independent with his advanced HEP. Baseline:  Goal status: MET  2.  Patient will be able to safely pick up items from the floor for improved household independence. Baseline:  Goal status: MET  3.  Patient will improve his 5 times sit to stand time to 30 seconds or less for improved lower extremity power. Baseline: 47.02 seconds on 10/21/23    11-01-23  40.31 secs   11/29/23:  28 seconds Goal status: MET.  4.  Patient will improve his timed up and go time to 15 seconds or less to reduce his fall risk. Baseline: 26.82 seconds on 10/21/23   11-01-23  22.89 secs.  11/29/23:  19 seconds Goal status: NM  5.  Patient will improve his 30 second sit to stand test to at least 5 repetitions for improved lower extremity power needed for transfers. Baseline: 3 reps on 10/21/23 Goal status: MET.  PLAN:  PT FREQUENCY: 2x/week  PT DURATION: 6 weeks  PLANNED INTERVENTIONS: 97164- PT Re-evaluation, 97750- Physical Performance Testing, 97110-Therapeutic exercises, 97530- Therapeutic activity, [id number]-  Neuromuscular re-education, 97535- Self Care, [id number]- Manual therapy, [phone]- Gait training, Patient/Family education, Balance training, and Stair training  PLAN FOR NEXT  SESSION:   Nustep, lower extremity strengthening, balance interventions, and gait training  PHYSICAL THERAPY DISCHARGE SUMMARY  Visits from Start of Care: 18.  Current functional level related to goals / functional outcomes: See above.   Remaining deficits: See goal section.     Education / Equipment: HEP.   Patient agrees to discharge. Patient goals were partially met. Patient is being discharged due to completing course of PT.  Patient was highly motivated during all his sessions.  He made very good progress toward his goals.  He is going to continue exercising at a local gym.   Quintara Bost, [place], PT 11/29/2023, 11:15 AM

## 2024-01-04 ENCOUNTER — Other Ambulatory Visit: Admitting: Urology

## 2024-01-25 ENCOUNTER — Encounter: Payer: Self-pay | Admitting: Urology

## 2024-01-25 ENCOUNTER — Ambulatory Visit: Admitting: Urology

## 2024-01-25 VITALS — BP 114/66 | HR 112

## 2024-01-25 DIAGNOSIS — C679 Malignant neoplasm of bladder, unspecified: Secondary | ICD-10-CM | POA: Diagnosis not present

## 2024-01-25 LAB — URINALYSIS, ROUTINE W REFLEX MICROSCOPIC
Bilirubin, UA: NEGATIVE
Ketones, UA: NEGATIVE
Leukocytes,UA: NEGATIVE
Nitrite, UA: NEGATIVE
Protein,UA: NEGATIVE
RBC, UA: NEGATIVE
Specific Gravity, UA: 1.01 (ref 1.005–1.030)
Urobilinogen, Ur: 0.2 mg/dL (ref 0.2–1.0)
pH, UA: 6 (ref 5.0–7.5)

## 2024-01-25 MED ORDER — CIPROFLOXACIN HCL 500 MG PO TABS
500.0000 mg | ORAL_TABLET | Freq: Once | ORAL | Status: AC
  Administered 2024-01-25: 500 mg via ORAL

## 2024-01-25 NOTE — Patient Instructions (Signed)

## 2024-01-25 NOTE — Progress Notes (Signed)
   01/25/24  CC: followup bladder cancer  HPI: Mr Mahler is a 73yo here for followup for bladder cancer.  Blood pressure 114/66, pulse (!) 112. NED. A&Ox3.   No respiratory distress   Abd soft, NT, ND Normal phallus with bilateral descended testicles  Cystoscopy Procedure Note  Patient identification was confirmed, informed consent was obtained, and patient was prepped using Betadine solution.  Lidocaine  jelly was administered per urethral meatus.     Pre-Procedure: - Inspection reveals a normal caliber ureteral meatus.  Procedure: The flexible cystoscope was introduced without difficulty - No urethral strictures/lesions are present. - Enlarged prostate  - Normal bladder neck - Bilateral ureteral orifices identified - Bladder mucosa  reveals no ulcers, tumors, or lesions - No bladder stones - No trabeculation     Post-Procedure: - Patient tolerated the procedure well  Assessment/ Plan: Followup 3 months for cystoscopy  No follow-ups on file.  Belvie Clara, MD

## 2024-04-21 ENCOUNTER — Encounter: Payer: Self-pay | Admitting: Gastroenterology

## 2024-04-25 ENCOUNTER — Ambulatory Visit: Admitting: Urology

## 2024-04-25 VITALS — BP 101/53 | HR 87

## 2024-04-25 DIAGNOSIS — C679 Malignant neoplasm of bladder, unspecified: Secondary | ICD-10-CM

## 2024-04-25 LAB — URINALYSIS, ROUTINE W REFLEX MICROSCOPIC
Bilirubin, UA: NEGATIVE
Ketones, UA: NEGATIVE
Leukocytes,UA: NEGATIVE
Nitrite, UA: NEGATIVE
Protein,UA: NEGATIVE
RBC, UA: NEGATIVE
Specific Gravity, UA: 1.01 (ref 1.005–1.030)
Urobilinogen, Ur: 0.2 mg/dL (ref 0.2–1.0)
pH, UA: 5.5 (ref 5.0–7.5)

## 2024-04-25 MED ORDER — CIPROFLOXACIN HCL 500 MG PO TABS
500.0000 mg | ORAL_TABLET | Freq: Once | ORAL | Status: AC
  Administered 2024-04-25: 500 mg via ORAL

## 2024-04-25 NOTE — Progress Notes (Signed)
   04/25/24  CC: followup bladder cancer   HPI: Mr Imhof is a 73yo here for followup for bladder cancer.  Blood pressure (!) 101/53, pulse 87. NED. A&Ox3.   No respiratory distress   Abd soft, NT, ND Normal phallus with bilateral descended testicles  Cystoscopy Procedure Note  Patient identification was confirmed, informed consent was obtained, and patient was prepped using Betadine solution.  Lidocaine  jelly was administered per urethral meatus.     Pre-Procedure: - Inspection reveals a normal caliber ureteral meatus.  Procedure: The flexible cystoscope was introduced without difficulty - No urethral strictures/lesions are present. - Enlarged prostate  - Normal bladder neck - Bladder mucosa  reveals no ulcers, tumors, or lesions - No bladder stones - No trabeculation    Post-Procedure: - Patient tolerated the procedure well  Assessment/ Plan: Followup 3 months for cystoscopy  No follow-ups on file.  Belvie Clara, MD

## 2024-05-01 ENCOUNTER — Encounter: Payer: Self-pay | Admitting: Urology

## 2024-05-01 NOTE — Patient Instructions (Signed)

## 2024-05-09 NOTE — Progress Notes (Unsigned)
 HPI :  73 year old male here for follow-up visit for Barrett's esophagus, epidermoid metaplasia of the esophagus, constipation.  Recall he has a history of numerous polyps removed in the past.  He is also had longstanding GERD been on chronic PPI and has had Barrett's esophagus historically.  He had an EGD with me in January 2022 showing a 2 cm segment of Barrett's esophagus without dysplasia.  He also was noted to have gastritis, and a mucosal abnormality of the esophagus at 35 cm from the incisors..  Pathology results as outlined below.  No evidence of H. 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 I last saw him in February 2023 about these issues.  We discussed doing an EGD last year for this and a colonoscopy 1 year after his last exam.  He had his exams in September 2024 as outlined.   EGD 02/21/23: - Esophagogastric landmarks were identified: the Z-line was found at 38 cm, the gastroesophageal junction was found at 40 cm and the upper extent of the gastric folds was found at 42 cm from the incisors. Findings: - A 2 cm hiatal hernia was present. - Barrett's esophagus was present in the lower third of the esophagus. Multiple small islands of salmon colored mucosa circumferentially, no nodularity. The maximum longitudinal extent of these mucosal changes was 2 cm in length. Biopsies were taken with a cold forceps for histology. - A single 8-10 mm whitish colored plaque was found in the lower third of the esophagus, 35 cm from the incisors. Biopsies were taken with a cold forceps for histology. - The exam of the esophagus was otherwise normal. - The entire examined stomach was normal. - The examined duodenum was normal.   Colonoscopy 02/21/23: - One 5 to 6 mm polyp at the hepatic flexure, removed with a cold snare. Resected and retrieved. - Two small ulcers in the ascending colon. Biopsied. - Three 3 to 4 mm polyps in the transverse colon, removed with a cold snare.  Resected and retrieved. - One 4 mm polyp in the sigmoid colon, removed with a cold snare. Resected and retrieved. - Diverticulosis in the sigmoid colon. - Internal hemorrhoids. - Stool in the entire examined colon leading to extensive lavage. - The examination was otherwise normal.  FINAL DIAGNOSIS       1. Surgical [P], distal esophagus :      - BARRETT MUCOSA, NO DYSPLASIA       2. Surgical [P], distal esophagus at 35 :      - SQUAMOUS MUCOSA WITH HYPERKERATOSIS AND FOCALLY PROMINENT GRANULAR LAYER.SEE      NOTE       3. Surgical [P], colon, sigmoid, transverse, and hepatic flexure, polyp (5) :      - TUBULAR ADENOMA(S).      - NO HIGH GRADE DYSPLASIA OR MALIGNANCY.       4. Surgical [P], colon, ascending ulcer :      - BENIGN COLONIC MUCOSA WITH MILD HYPERPLASTIC AND REACTIVE/REPARATIVE CHANGES      - NEGATIVE FOR FEATURES OF CHRONICITY OR ACUTE INFLAMMATION      - NEGATIVE FOR DYSPLASIA OR MALIGNANCY       Diagnosis Note : - The findings are consistent with epidermoid metaplasia in the      appropriate clinical context.There is no evidence of sebaceous metaplasia,      intestinal metaplasia, dysplasia or malignancy.This part was reviewed with Dr.Lu      who agrees with the  above interpretation.     Given the findings of this exam he was referred to Dr. Wilhelmenia to discuss possible endoscopic treatment of epidermoid metaplasia.  They had a visit this past February.  The patient declined endoscopic treatment and preferred to stick with surveillance.  We discussed when he wanted to do this and we had suggested 1 year from his last exam, so overdue for that.  He is taking Protonix  40 mg every other day, he stopped taking it every day due to concern about his potential effects on his kidney function status post nephrectomy.  Again he has been on this for a long time and has Barrett's esophagus.  He states his reflux is pretty well-controlled but he has been having dysphagia to pills for  the past year or so, states he feels they get stuck in his throat and have a hard time going down.  He denies any cardiopulmonary symptoms.  He takes aspirin  and Plavix  for history of CAD.  He otherwise reports having constipation that has been bothering him.  He uses Metamucil and MiraLAX  as needed, they help when he takes it but he does not take it routinely.  His colonoscopy is up-to-date as outlined     Prior work-up:  Colonoscopy 10/14/2017 -  - One 7 mm polyp in the cecum, removed with a cold snare. Resected and retrieved. - Two 8 to 10 mm polyps in the ascending colon, removed with a cold snare. Resected and retrieved. - One 5 mm polyp in the sigmoid colon, removed with a cold snare. Resected and retrieved. - Two 3 to 5 mm polyps in the sigmoid colon, removed with a cold snare. Resected and retrieved. - One 7 mm polyp in the rectum, removed with a hot snare. Resected and retrieved. - Internal hemorrhoids. - The examination was otherwise normal. - Several minutes spent lavaging the colon to achieve adequate views   1. Surgical [P], rectosigmoid, sigmoid, ascending, and cecum, polyp (6) - TUBULAR ADENOMA. - MULTIPLE FRAGMENTS OF SESSILE SERRATED POLYP WITHOUT DYSPLASIA. - MULTIPLE FRAGMENTS OF HYPERPLASTIC POLYP. - NO HIGH GRADE DYSPLASIA OR MALIGNANCY. 2. Surgical [P], rectum, polyp - LIPOMA. - NO DYSPLASIA OR MALIGNANCY.     Echo 09/04/2018 - EF 45-50%   Cardiac cath 09/04/18 - 1.  Multivessel coronary artery disease with chronic occlusion of the apical LAD and mid circumflex, continued patency of the stented segment in the ramus intermedius, and severe progressive stenosis of the right PDA 2.  Mild segmental contraction abnormality the left ventricle with preserved overall LVEF estimated at 55 to 60% 3.  Normal LVEDP 4.  Successful balloon angioplasty of the right PDA, treated with p.o. BA because of small vessel caliber      EGD 06/05/2021 -  - A 2 cm hiatal hernia was  present. - There were esophageal mucosal changes classified as Barrett's stage C0-M2 per [place] criteria present in the lower third of the esophagus (one 2 cm tongue and multiple small islands of salmon colored mucosa). The maximum longitudinal extent of these mucosal changes was 2 cm in length. Biopsies were taken with a cold forceps for histology. - A single white plaque was found in the lower third of the esophagus, 35 cm from the incisors, could not be lavaged off. Biopsies were taken with a cold forceps for histology. - The exam of the esophagus was otherwise normal. - Patchy moderate inflammation characterized by erosions, erythema and friability was found in the gastric antrum. - The exam of  the stomach was otherwise normal. - Biopsies were taken with a cold forceps in the gastric body, at the incisura and in the gastric antrum for Helicobacter pylori testing. - The duodenal bulb and second portion of the duodenum were normal.     Colonoscopy 06/04/2021:Four 3 to 7 mm polyps in the ascending colon, removed with a cold snare. Resected and retrieved. - Two 5 to 10 mm polyps in the transverse colon, removed with a cold snare. Resected and retrieved. - Four 4 to 8 mm polyps in the descending colon, removed with a cold snare. Resected and retrieved. - Three 3 to 5 mm polyps in the sigmoid colon, removed with a cold snare. Resected and retrieved. - Diverticulosis in the left colon. - Internal hemorrhoids. - The examination was otherwise normal. - Biopsies were taken with a cold forceps for evaluation of microscopic colitis.   1. Surgical [P], gastric antrum and gastric body - GASTRIC OXYNTIC MUCOSA WITH PARIETAL CELL HYPERPLASIA AS CAN BE SEEN IN HYPERGASTRINEMIC STATES SUCH AS PPI THERAPY. - GASTRIC ANTRAL MUCOSA WITH NO SPECIFIC HISTOPATHOLOGIC CHANGES - HELICOBACTER PYLORI-LIKE ORGANISMS ARE NOT IDENTIFIED ON ROUTINE H&E STAIN 2. Surgical [P], distal esophagus - BARRETTS  ESOPHAGUS, NEGATIVE FOR DYSPLASIA 3. Surgical [P], distal esophagus at 35 cm ESOPHAGEAL MUCOSA WITH EPIDERMOID METAPLASIA. SEE NOTE 4. Surgical [P], colon, sigmoid, descending, transverse, and ascending, polyp (13) - TUBULAR ADENOMA(S) WITHOUT HIGH-GRADE DYSPLASIA OR MALIGNANCY - SESSILE SERRATED POLYP(S) WITHOUT CYTOLOGIC DYSPLASIA - HYPERPLASTIC POLYP(S) 5. Surgical [P], colon nos, random sites - COLONIC MUCOSA WITH NO SPECIFIC HISTOPATHOLOGIC CHANGES - NEGATIVE FOR ACUTE INFLAMMATION, INCREASED INTRAEPITHELIAL LYMPHOCYTES OR THICKENED SUBEPITHELIAL COLLAGEN TABLE   3. Esophageal squamous mucosa shows orthokeratosis and hyperkeratosis. Some studies have shown possible association of epidermoid metaplasia with squamous cell carcinoma.     EGD 02/21/23: - Esophagogastric landmarks were identified: the Z-line was found at 38 cm, the gastroesophageal junction was found at 40 cm and the upper extent of the gastric folds was found at 42 cm from the incisors. Findings: - A 2 cm hiatal hernia was present. - Barrett's esophagus was present in the lower third of the esophagus. Multiple small islands of salmon colored mucosa circumferentially, no nodularity. The maximum longitudinal extent of these mucosal changes was 2 cm in length. Biopsies were taken with a cold forceps for histology. - A single 8-10 mm whitish colored plaque was found in the lower third of the esophagus, 35 cm from the incisors. Biopsies were taken with a cold forceps for histology. - The exam of the esophagus was otherwise normal. - The entire examined stomach was normal. - The examined duodenum was normal.   Colonoscopy 02/21/23: - One 5 to 6 mm polyp at the hepatic flexure, removed with a cold snare. Resected and retrieved. - Two small ulcers in the ascending colon. Biopsied. - Three 3 to 4 mm polyps in the transverse colon, removed with a cold snare. Resected and retrieved. - One 4 mm polyp in the sigmoid colon, removed with a cold  snare. Resected and retrieved. - Diverticulosis in the sigmoid colon. - Internal hemorrhoids. - Stool in the entire examined colon leading to extensive lavage. - The examination was otherwise normal.  FINAL DIAGNOSIS       1. Surgical [P], distal esophagus :      - BARRETT MUCOSA, NO DYSPLASIA       2. Surgical [P], distal esophagus at 35 :      - SQUAMOUS MUCOSA WITH HYPERKERATOSIS AND FOCALLY PROMINENT  GRANULAR LAYER.SEE      NOTE       3. Surgical [P], colon, sigmoid, transverse, and hepatic flexure, polyp (5) :      - TUBULAR ADENOMA(S).      - NO HIGH GRADE DYSPLASIA OR MALIGNANCY.       4. Surgical [P], colon, ascending ulcer :      - BENIGN COLONIC MUCOSA WITH MILD HYPERPLASTIC AND REACTIVE/REPARATIVE CHANGES      - NEGATIVE FOR FEATURES OF CHRONICITY OR ACUTE INFLAMMATION      - NEGATIVE FOR DYSPLASIA OR MALIGNANCY       Diagnosis Note : - The findings are consistent with epidermoid metaplasia in the      appropriate clinical context.There is no evidence of sebaceous metaplasia,      intestinal metaplasia, dysplasia or malignancy.This part was reviewed with Dr.Lu      who agrees with the above interpretation.     Past Medical History:  Diagnosis Date   Acute viral pericarditis 09/03/2018   Inferior STE - but Negative Troponin.  CP &SOB.  Felt to be Viral.   Anemia    Arthritis    Bladder cancer (HCC) 2014   CAD (coronary artery disease)    Cataract    bilateral   Complication of anesthesia    Depression    Diabetes mellitus without complication (HCC)    Essential hypertension 01/20/2018   in the past no longer on medication   Family history of breast cancer    Family history of prostate cancer    Family history of testicular cancer    GERD (gastroesophageal reflux disease)    Heart attack (HCC) 03/2022   Hx of adenomatous colonic polyps 09/2017   colonoscopy   Hyperlipidemia with target LDL less than 70 12/19/2015   Intraoperative floppy iris syndrome (IFIS)  07/2019   Ischemic cardiomyopathy    Multivessel CAD - CTO dLAD, mCx. DES PCI RI, PTCA of RPDA 01/21/2018   12/2017 - Cath for ? STEMI -> distal/apical LAD & m-dCx CTO (unable to cross Cx).  Mod rPDA. Severe RI - DES PCI.  08/2018 - Cath for ? Inf STEMI - RI stent patent & CTO dLAD/mCx. Progression of rPDA 95% -> PTCA only.  Thought to be Pericarditis & not MI (troponin negative).    Neuromuscular disorder (HCC)    Polyposis of colon 01/25/2023   Sleep apnea    BiPAP not currently being used   Status post tendon repair 1989   STEMI (ST elevation myocardial infarction) (HCC) 01/20/2018   Cardiac cath January 21, 2018: Severe multivessel disease with unclear lesion, but opted for PCI/DES x1 to the RI.  Appeared to have CTO of the distal LAD and circumflex as well as moderate mid LAD and diagonal disease as well as PDA.SABRA  Unable to cross circumflex lesion.   STEMI (ST elevation myocardial infarction) (HCC) 03/2022   Stroke (HCC) 11/2015   At times pt has dizziness with loss of vision   Tremors of nervous system 2011     Past Surgical History:  Procedure Laterality Date   APPENDECTOMY     BLADDER SURGERY     COLONOSCOPY     CORONARY STENT INTERVENTION N/A 01/21/2018   Procedure: CORONARY STENT INTERVENTION;  Surgeon: Anner Alm ORN, MD;  Location: [facility] INVASIVE CV LAB;  Service: Cardiovascular;  Laterality: N/A;  95% Ramus Intermedius -PCI with synergy DES 2.25 mm x 12 mm postdilated 2.4 mm.   CORONARY/GRAFT ACUTE MI REVASCULARIZATION N/A  01/21/2018   Procedure: Coronary/Graft Acute MI Revascularization;  Surgeon: Anner Alm ORN, MD;  Location: [facility] INVASIVE CV LAB;  Service: Cardiovascular;  Laterality: N/A;  attempted revas to distal CFX;    CORONARY/GRAFT ACUTE MI REVASCULARIZATION N/A 09/04/2018   Procedure: Coronary/Graft Acute MI Revascularization;  Surgeon: Wonda Sharper, MD;  Location: [facility] INVASIVE CV LAB:: PTCA/POBA of rPDA (2.0 mm balloon)   CYSTOSCOPY N/A 01/21/2020   Procedure:  CYSTOSCOPY;  Surgeon: Sherrilee Belvie CROME, MD;  Location: [facility];  Service: Urology;  Laterality: N/A;   CYSTOSCOPY N/A 02/09/2021   Procedure: CYSTOSCOPY;  Surgeon: Sherrilee Belvie CROME, MD;  Location: [facility];  Service: Urology;  Laterality: N/A;   CYSTOSCOPY Left 11/08/2022   Procedure: CYSTOSCOPY;  Surgeon: Sherrilee Belvie CROME, MD;  Location: [facility];  Service: Urology;  Laterality: Left;   CYSTOSCOPY W/ RETROGRADES Bilateral 08/22/2015   Procedure: CYSTOSCOPY WITH RETROGRADE PYELOGRAM;  Surgeon: Norleen Seltzer, MD;  Location: [facility];  Service: Urology;  Laterality: Bilateral;   CYSTOSCOPY W/ RETROGRADES Bilateral 01/16/2016   Procedure: CYSTOSCOPY WITH RETROGRADE PYELOGRAM;  Surgeon: Norleen Seltzer, MD;  Location: [facility];  Service: Urology;  Laterality: Bilateral;   CYSTOSCOPY W/ RETROGRADES Bilateral 01/07/2017   Procedure: CYSTOSCOPY WITH RETROGRADE PYELOGRAM;  Surgeon: Seltzer Norleen, MD;  Location: [facility];  Service: Urology;  Laterality: Bilateral;   CYSTOSCOPY WITH BIOPSY N/A 08/22/2015   Procedure: CYSTOSCOPY WITH BLADDER BIOPSY;  Surgeon: Norleen Seltzer, MD;  Location: [facility];  Service: Urology;  Laterality: N/A;   CYSTOSCOPY WITH BIOPSY N/A 01/16/2016   Procedure: CYSTOSCOPY WITH BLADDER BIOPSY;  Surgeon: Norleen Seltzer, MD;  Location: [facility];  Service: Urology;  Laterality: N/A;   CYSTOSCOPY WITH FULGERATION N/A 01/16/2016   Procedure: CYSTOSCOPY WITH FULGERATION;  Surgeon: Norleen Seltzer, MD;  Location: [facility];  Service: Urology;  Laterality: N/A;   CYSTOSCOPY/RETROGRADE/URETEROSCOPY Left 09/06/2022   Procedure: CYSTOSCOPY/RETROGRADE/URETEROSCOPY/STENT PLACEMENT;  Surgeon: Sherrilee Belvie CROME, MD;  Location: [facility];  Service: Urology;  Laterality: Left;   KNEE ARTHROSCOPY Right 1989   LEFT HEART CATH AND CORONARY ANGIOGRAPHY N/A 01/21/2018   Procedure: LEFT HEART CATH AND CORONARY ANGIOGRAPHY;  Surgeon: Anner Alm ORN, MD;  Location: [facility] INVASIVE CV LAB;  Service: Cardiovascular;; 95% RI-DES PCI.  80% o-pCX (PTCA)  -> dCX 80%-100% CTO (unsuccessful PTCA unable to cross distal lesion with balloon.)  100% CTO dLAD. RPDA 80% and 70% as well as P AV 180%, PA V2 60%.  (too small for PCI)   LEFT HEART CATH AND CORONARY ANGIOGRAPHY N/A 09/04/2018   Procedure: LEFT HEART CATH AND CORONARY ANGIOGRAPHY;  Surgeon: Wonda Sharper, MD;  Location: [facility] INVASIVE CV LAB: (presumed Inferior STEMI) = progression of small rPDA -95% (PTCA only). Patent RI stent. CTO of apical LAD & mCx.   ROBOT ASSITED LAPAROSCOPIC NEPHROURETERECTOMY Left 11/08/2022   Procedure: XI ROBOT ASSITED LAPAROSCOPIC NEPHROURETERECTOMY;  Surgeon: Sherrilee Belvie CROME, MD;  Location: [facility];  Service: Urology;  Laterality: Left;   ROTATOR CUFF REPAIR Bilateral 2000   rt. leg fracture surgery     TRANSTHORACIC ECHOCARDIOGRAM  01/21/2018   Mild LVH.  EF 50 and 55%.  Apical inferior hypokinesis.  Mid-apical anterolateral hypokinesis.  Normal diastolic function for age    TRANSTHORACIC ECHOCARDIOGRAM  09/04/2018   -? Inf STEMI vs. Pericarditis:  Mildly reduced EF of 45 to 50%.  Impaired relaxation-GR 1 DD.  Severe hypokinesis of the anterolateral and inferolateral wall.  Small-moderate anterior pericardial effusion.  Moderate aortic sclerosis but no stenosis.   TRANSURETHRAL  RESECTION OF BLADDER TUMOR N/A 01/07/2017   Procedure: TRANSURETHRAL RESECTION OF BLADDER TUMOR (TURBT);  Surgeon: Watt Rush, MD;  Location: [facility];  Service: Urology;  Laterality: N/A;   TRANSURETHRAL RESECTION OF BLADDER TUMOR N/A 01/21/2020   Procedure: TRANSURETHRAL RESECTION OF BLADDER TUMOR (TURBT);  Surgeon: Sherrilee Belvie CROME, MD;  Location: [facility];  Service: Urology;  Laterality: N/A;   TRANSURETHRAL RESECTION OF BLADDER TUMOR N/A 02/09/2021   Procedure: TRANSURETHRAL RESECTION OF BLADDER TUMOR (TURBT);  Surgeon: Sherrilee Belvie CROME, MD;  Location: [facility];  Service: Urology;  Laterality: N/A;   URETERAL BIOPSY Left 09/06/2022   Procedure: URETERAL BIOPSY/fulgeration;  Surgeon:  Sherrilee Belvie CROME, MD;  Location: [facility];  Service: Urology;  Laterality: Left;   WISDOM TOOTH EXTRACTION     Family History  Problem Relation Age of Onset   Diabetes Mother 61       type 1   Stroke Mother    Dementia Father    Diabetes Brother    Heart attack Brother    Diabetes Brother    Testicular cancer Brother        dx. 50s   Breast cancer Maternal Aunt    Lung cancer Maternal Uncle    Bladder Cancer Paternal Grandmother    Prostate cancer Paternal Grandfather    Colon cancer Neg Hx    Colon polyps Neg Hx    Esophageal cancer Neg Hx    Rectal cancer Neg Hx    Stomach cancer Neg Hx    Pancreatic cancer Neg Hx    Inflammatory bowel disease Neg Hx    Liver disease Neg Hx    Social History[1] Current Outpatient Medications  Medication Sig Dispense Refill   aspirin  EC 81 MG tablet Take 81 mg by mouth daily. Swallow whole.     Carboxymethylcellul-Glycerin (LUBRICATING EYE DROPS OP) Place 1 drop into both eyes daily as needed (dry eyes).     Cholecalciferol (VITAMIN D) 50 MCG (2000 UT) tablet Take 2,000 Units by mouth 2 (two) times daily.     clopidogrel  (PLAVIX ) 75 MG tablet Take 75 mg by mouth daily.     diclofenac  Sodium (VOLTAREN ) 1 % GEL Apply 1 Application topically 4 (four) times daily as needed (pain).     empagliflozin  (JARDIANCE ) 25 MG TABS tablet Take 25 mg by mouth daily. 90 mg 3   fenofibrate  (TRICOR ) 48 MG tablet Take 1 tablet (48 mg total) by mouth daily. 90 tablet 3   furosemide  (LASIX ) 40 MG tablet Take 20 mg by mouth daily. (Patient taking differently: Take 40 mg by mouth daily.)     glimepiride  (AMARYL ) 2 MG tablet Take 2 mg by mouth daily before breakfast.     insulin  degludec (TRESIBA FLEXTOUCH) 100 UNIT/ML FlexTouch Pen Inject 14 Units into the skin daily.     isosorbide  mononitrate (IMDUR ) 30 MG 24 hr tablet Take 1 tablet (30 mg total) by mouth daily. (Patient taking differently: Take 15 mg by mouth every evening.) 90 tablet 3   levothyroxine   (SYNTHROID ) 50 MCG tablet Take 50 mcg by mouth daily before breakfast.     magnesium  hydroxide (DULCOLAX) 400 MG/5ML suspension Take 15-30 mLs by mouth as needed for mild constipation.     metFORMIN  (GLUCOPHAGE ) 1000 MG tablet Take 1 tablet by mouth 2 times daily with a meal. (Patient taking differently: Take 1,000 mg by mouth 2 (two) times daily with a meal.) 180 tablet 3   metoprolol  succinate (TOPROL -XL) 25 MG 24 hr tablet Take  25 mg by mouth daily.     Multiple Vitamins-Minerals (PRESERVISION AREDS 2) CAPS Take 1 capsule by mouth 2 (two) times daily.     nitroGLYCERIN  (NITROSTAT ) 0.4 MG SL tablet PLACE 1 TABLET UNDER THE TONGUE AT ONSET OF CHEST PAIN EVERY 5 MINTUES UP TO 3 TIMES AS NEEDED 25 tablet 0   ONETOUCH VERIO test strip daily.     polyethylene glycol (MIRALAX ) 17 g packet Take 34 g by mouth daily.     primidone  (MYSOLINE ) 50 MG tablet Take 1 tablet (50 mg total) by mouth 4 (four) times daily. (Patient taking differently: Take 100 mg by mouth 4 (four) times daily.) 360 tablet 3   ranolazine  (RANEXA ) 500 MG 12 hr tablet Take 500 mg by mouth 2 (two) times daily.     rosuvastatin  (CRESTOR ) 40 MG tablet Take 1 tablet (40 mg total) by mouth daily. 90 tablet 3   tamsulosin  (FLOMAX ) 0.4 MG CAPS capsule Take 1 capsule (0.4 mg total) by mouth daily. 90 capsule 3   pantoprazole  (PROTONIX ) 20 MG tablet Take 1 tablet (20 mg total) by mouth daily.     No current facility-administered medications for this visit.   Allergies[2]   Review of Systems: All systems reviewed and negative except where noted in HPI.    Lab Results  Component Value Date   NA 139 03/02/2023   CL 101 03/02/2023   K 4.5 03/02/2023   CO2 22 03/02/2023   BUN 24 03/02/2023   CREATININE 1.14 03/02/2023   EGFR 68 03/02/2023   CALCIUM  9.4 03/02/2023   ALBUMIN  4.4 03/02/2023   GLUCOSE 121 (H) 03/02/2023     Physical Exam: BP 112/78   Pulse 88   Ht 6' 4 (1.93 m)   Wt 209 lb (94.8 kg)   SpO2 98%   BMI 25.44  kg/m  Constitutional: Pleasant,well-developed, male in no acute distress. Neurological: Alert and oriented to person place and time. Psychiatric: Normal mood and affect. Behavior is normal.   ASSESSMENT: 73 y.o. male here for assessment of the following  1. Gastroesophageal reflux disease, unspecified whether esophagitis present   2. Barrett's esophagus without dysplasia   3. Lesion of esophagus   4. Long-term current use of proton pump inhibitor therapy   5. Dysphagia, unspecified type   6. Constipation, unspecified constipation type   7. History of colon polyps    Longstanding GERD and short segment Barrett's esophagus on chronic PPI.  He has otherwise developed epidermoid metaplasia of the esophagus.  We discussed what this is, that both of these areas can increase his risk for esophageal cancer.  He has been seen by Dr. Wilhelmenia of advanced endoscopy to discuss endoscopic eradication of this.  Following discussion of what that would entail, he declined and wanted to pursue surveillance.  We had recommended a repeat EGD 1 year from his last exam, he is overdue for that.  Offered him EGD to further evaluate this.  He also has dysphagia to pills that has since developed, feels this mostly in the sternal notch.  We discussed what EGD entails, offered him dilation at the time of the exam.  To do this he would need to hold his Plavix  for 5 days prior to the exam, but can continue aspirin  throughout the periprocedure period.  He wants to proceed with this.  Will plan on biopsying the areas of concern, if the epidermoid metaplasia is enlarging may consider endoscopic ablation.  Otherwise I discussed the importance of taking chronic PPI  for him, I think this will help reduce his risk for esophagus cancer over time.  He had decreased to intermittent dosing due to the theoretical concern of worsening kidney function although my understanding is that his kidney has been functioning okay?  Creatinine  of 1.1 when last checked in June.  I recommend he resume PPI daily but can reduce to 20 mg Protonix  per day if okay with his primary care in regards to kidney function.  They understand and agree with this.  For his constipation, colonoscopy up-to-date.  Recommend he take double dose of MiraLAX  daily (single dose previously did not provide adequate relief).  He should do this and let me know how he does, can stop fiber supplement at the same time.  If symptoms persist can offer more aggressive regimens.   PLAN: - schedule EGD at [facility] - with likely dilation. Will ask for approval to hold Plavix , can continue baby aspirin  throughout peri-procedure time frame - take protonix  daily - can reduce to 20mg  / day - discussed long term risks of chronic PPI - take Miralax  double dose daily and titrate as needed - can stop fiber - colonoscopy UTD  Marcey Naval, MD [facility]     [1]  Social History Tobacco Use   Smoking status: Former    Current packs/day: 1.00    Average packs/day: 1 pack/day for 50.0 years (50.0 ttl pk-yrs)    Types: Cigarettes   Smokeless tobacco: Former  Building Services Engineer status: Never Used  Substance Use Topics   Alcohol use: No    Comment: rarely   Drug use: No  [2]  Allergies Allergen Reactions   Tramadol  Shortness Of Breath    And dizziness   Benadryl  [Diphenhydramine ] Other (See Comments)    Patient was knocked out for 3 days after surgery patient received benadryl  for severe itching after surgery   Betadine [Povidone Iodine] Itching    Patient had severe itching after surgery in the area betadine was used   Trulicity  [Dulaglutide ] Swelling    Ankles and feet swell

## 2024-05-10 ENCOUNTER — Telehealth: Payer: Self-pay

## 2024-05-10 ENCOUNTER — Ambulatory Visit: Admitting: Gastroenterology

## 2024-05-10 ENCOUNTER — Encounter: Payer: Self-pay | Admitting: Gastroenterology

## 2024-05-10 VITALS — BP 112/78 | HR 88 | Ht 76.0 in | Wt 209.0 lb

## 2024-05-10 DIAGNOSIS — K219 Gastro-esophageal reflux disease without esophagitis: Secondary | ICD-10-CM

## 2024-05-10 DIAGNOSIS — K2270 Barrett's esophagus without dysplasia: Secondary | ICD-10-CM | POA: Diagnosis not present

## 2024-05-10 DIAGNOSIS — Z79899 Other long term (current) drug therapy: Secondary | ICD-10-CM

## 2024-05-10 DIAGNOSIS — K229 Disease of esophagus, unspecified: Secondary | ICD-10-CM

## 2024-05-10 DIAGNOSIS — R1310 Dysphagia, unspecified: Secondary | ICD-10-CM | POA: Diagnosis not present

## 2024-05-10 DIAGNOSIS — K5900 Constipation, unspecified: Secondary | ICD-10-CM | POA: Diagnosis not present

## 2024-05-10 DIAGNOSIS — Z860100 Personal history of colon polyps, unspecified: Secondary | ICD-10-CM | POA: Diagnosis not present

## 2024-05-10 MED ORDER — POLYETHYLENE GLYCOL 3350 17 G PO PACK: 34.0000 g | PACK | Freq: Every day | ORAL | Status: AC

## 2024-05-10 MED ORDER — PANTOPRAZOLE SODIUM 20 MG PO TBEC: 20.0000 mg | DELAYED_RELEASE_TABLET | Freq: Every day | ORAL | Status: AC

## 2024-05-10 NOTE — Patient Instructions (Addendum)
 You have been scheduled for an endoscopy. Please follow written instructions given to you at your visit today.  If you use inhalers (even only as needed), please bring them with you on the day of your procedure.  If you take any of the following medications, they will need to be adjusted prior to your procedure:   DO NOT TAKE 7 DAYS PRIOR TO TEST- Trulicity  (dulaglutide ) Ozempic, Wegovy (semaglutide) Mounjaro, Zepbound (tirzepatide) Bydureon Bcise (exanatide extended release)  DO NOT TAKE 1 DAY PRIOR TO YOUR TEST Rybelsus (semaglutide) Adlyxin (lixisenatide) Victoza (liraglutide) Byetta (exanatide) ___________________________________________________________________________   Decrease Protonix  to 20 mg once daily.  Please purchase the following medications over the counter and take as directed: Miralax  - Take a double dose once daily  Stop fiber  You will be contacted by our office prior to your procedure for directions on holding your Plavix .  If you do not hear from our office 1 week prior to your scheduled procedure, please call [phone] to discuss. Note: When holding Plavix , please continue an 81mg  aspirin  daily.  Thank you for entrusting me with your care and for choosing [facility], Dr. Elspeth Naval    _______________________________________________________  If your blood pressure at your visit was 140/90 or greater, please contact your primary care physician to follow up on this.  _______________________________________________________  If you are age 74 or older, your body mass index should be between 23-30. Your Body mass index is 25.44 kg/m. If this is out of the aforementioned range listed, please consider follow up with your Primary Care Provider.  If you are age 37 or younger, your body mass index should be between 19-25. Your Body mass index is 25.44 kg/m. If this is out of the aformentioned range listed, please consider follow up with your  Primary Care Provider.   ________________________________________________________  The [facility] providers would like to encourage you to use MYCHART to communicate with providers for non-urgent requests or questions.  Due to long hold times on the telephone, sending your provider a message by [facility] may be a faster and more efficient way to get a response.  Please allow 48 business hours for a response.  Please remember that this is for non-urgent requests.  _______________________________________________________  [facility] is using a team-based approach to care.  Your team is made up of your doctor and two to three APPS. Our APPS (Nurse Practitioners and Physician Assistants) work with your physician to ensure care continuity for you. They are fully qualified to address your health concerns and develop a treatment plan. They communicate directly with your gastroenterologist to care for you. Seeing the Advanced Practice Practitioners on your physician's team can help you by facilitating care more promptly, often allowing for earlier appointments, access to diagnostic testing, procedures, and other specialty referrals.

## 2024-05-10 NOTE — Telephone Encounter (Signed)
 Letter faxed to Dr. Norleen Solar at [facility]:  Fax: [phone]. PH: [phone]   Andrew [name] 23-Jul-1950 [id number]  05/10/2024   Dear Dr. Solar:  We have scheduled the above named patient for an EGD procedure. Our records show that he is on anticoagulation therapy.  Please advise as to whether the patient may come off their therapy of PLAVIX  5 days prior to their procedure which is scheduled for 06-14-24.  Please route your response to [facility], CMA or fax response to [phone]. You can call us  at [phone].  Sincerely,    [facility]

## 2024-05-14 NOTE — Telephone Encounter (Signed)
 Called and spoke to patient's daughter, Gustav.  Relayed instructions to have patient hold Plavix  for 5 days before procedure but continue 81 mg aspirin . She expressed understanding

## 2024-06-07 ENCOUNTER — Encounter: Payer: Self-pay | Admitting: Gastroenterology

## 2024-06-08 ENCOUNTER — Telehealth: Payer: Self-pay | Admitting: *Deleted

## 2024-06-08 NOTE — Telephone Encounter (Signed)
 Thanks for helping with this.  POD A RN - can you please reach out to Dr. Dorisann at [facility] to see if they would be willing to provide cardiac clearance for this patient for his exam next week? If not his case will need to be postponed. thanks

## 2024-06-08 NOTE — Telephone Encounter (Signed)
 Dr. Leigh,  This pt, scheduled with you on January 22, has multiple co-morbities so we are asking for cardiology to provide clearance.  Regards,  Norleen EMERSON Schillings

## 2024-06-08 NOTE — Telephone Encounter (Signed)
 [facility] Pre-operative Risk Assessment     Request for surgical clearance:     Endoscopy Procedure  What type of surgery is being performed?     Colonoscopy  When is this surgery scheduled?     06/14/2024 - procedure is next week - please respond ASAP  What type of clearance is required ?   Pharmacy  Are there any medications that need to be held prior to surgery and how long? Patient has already been cleared to hold Plavix  - Anesthesia is asking for BASIC CARDIAC CLEARANCE as well.    Practice name and name of physician performing surgery?      [facility]  What is your office phone and fax number?      Phone- [phone]  Fax- [phone]  Anesthesia type (None, local, MAC, general) ?       MAC   Please route your response to [place] or Jan

## 2024-06-08 NOTE — Telephone Encounter (Signed)
 New letter faxed to Dr. Dorisann regarding general cardiac clearance.

## 2024-06-08 NOTE — Telephone Encounter (Signed)
" ° °  Patient has not been seen by [facility] since 2021. It looks like he now follows with Dr. Dorisann at [facility]. Please reach out to them for pre-op risk assessment.   Thank you! Rynell Ciotti  "

## 2024-06-08 NOTE — Telephone Encounter (Signed)
 Sent new request to preop team for basic cardiac clearance (has already been cleared to hold Plavix ).

## 2024-06-12 NOTE — Telephone Encounter (Signed)
 Andrew [name] was provided with a paper copy and reviewed the cardiac clearance for procedure on Thursday.

## 2024-06-12 NOTE — Telephone Encounter (Signed)
 Incoming call from Dr. Barbee clinic regarding general cardiac clearance.  Call back number [phone] option 2 Crystal. Please advise. Thank you.

## 2024-06-12 NOTE — Telephone Encounter (Signed)
 Cardiology has cleared patient for procedure - will provide Norleen Schillings with a paper copy of this approval and scanned into his chart.

## 2024-06-12 NOTE — Telephone Encounter (Signed)
 Lm 2nd message for Crystal regarding cardiac clearance

## 2024-06-14 ENCOUNTER — Ambulatory Visit: Admitting: Gastroenterology

## 2024-06-14 ENCOUNTER — Encounter: Payer: Self-pay | Admitting: Gastroenterology

## 2024-06-14 VITALS — BP 130/71 | HR 73 | Temp 97.7°F | Resp 11 | Ht 76.0 in | Wt 209.0 lb

## 2024-06-14 DIAGNOSIS — K2970 Gastritis, unspecified, without bleeding: Secondary | ICD-10-CM | POA: Diagnosis not present

## 2024-06-14 DIAGNOSIS — R1310 Dysphagia, unspecified: Secondary | ICD-10-CM

## 2024-06-14 DIAGNOSIS — K2270 Barrett's esophagus without dysplasia: Secondary | ICD-10-CM

## 2024-06-14 DIAGNOSIS — K209 Esophagitis, unspecified without bleeding: Secondary | ICD-10-CM | POA: Diagnosis not present

## 2024-06-14 DIAGNOSIS — K319 Disease of stomach and duodenum, unspecified: Secondary | ICD-10-CM

## 2024-06-14 DIAGNOSIS — K449 Diaphragmatic hernia without obstruction or gangrene: Secondary | ICD-10-CM | POA: Diagnosis not present

## 2024-06-14 DIAGNOSIS — K229 Disease of esophagus, unspecified: Secondary | ICD-10-CM

## 2024-06-14 NOTE — Progress Notes (Signed)
1007 Robinul 0.1 mg IV given due large amount of secretions upon assessment.  MD made aware, vss 

## 2024-06-14 NOTE — Progress Notes (Signed)
 Called to room to assist during endoscopic procedure.  Patient ID and intended procedure confirmed with present staff. Received instructions for my participation in the procedure from the performing physician.

## 2024-06-14 NOTE — Progress Notes (Signed)
 [facility] History and Physical   Primary Care Physician:  Renato Dorothey HERO, NP   Reason for Procedure:   Epidermoid metaplasia, Barrett's dysphagia  Plan:    EGD with possible dilation     HPI: Andrew [name] is a 74 y.o. male  here for EGD - to survey epidermoid metaplasia, Barrett's and further evaluate dysphagia. Off plavix  for 5 days for this exam, cardiology has cleared the patient to proceed with this exam. On protonix  20mg  / day.    Otherwise feels well without any cardiopulmonary symptoms. States dysphagia is mild. If no overt stenosis, he did not want empiric dilation.  I have discussed risks / benefits of anesthesia and endoscopic procedure with Andrew [name] and they wish to proceed with the exams as outlined today.   The patient was provided an opportunity to ask questions and all were answered. The patient agreed with the plan.    Past Medical History:  Diagnosis Date   Acute viral pericarditis 09/03/2018   Inferior STE - but Negative Troponin.  CP &SOB.  Felt to be Viral.   Anemia    Arthritis    Bladder cancer (HCC) 2014   CAD (coronary artery disease)    Cataract    bilateral   Complication of anesthesia    Depression    Diabetes mellitus without complication (HCC)    Essential hypertension 01/20/2018   in the past no longer on medication   Family history of breast cancer    Family history of prostate cancer    Family history of testicular cancer    GERD (gastroesophageal reflux disease)    Heart attack (HCC) 03/2022   Hx of adenomatous colonic polyps 09/2017   colonoscopy   Hyperlipidemia with target LDL less than 70 12/19/2015   Intraoperative floppy iris syndrome (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 DES PCI RI, PTCA of RPDA 01/21/2018   12/2017 - Cath for ? STEMI -> distal/apical LAD & m-dCx CTO (unable to cross Cx).  Mod rPDA. Severe RI - DES PCI.  08/2018 - Cath for ? Inf STEMI - RI stent  patent & CTO dLAD/mCx. Progression of rPDA 95% -> PTCA only.  Thought to be Pericarditis & not MI (troponin negative).    Neuromuscular disorder (HCC)    Polyposis of colon 01/25/2023   Sleep apnea    BiPAP not currently being used   Status post tendon repair 1989   STEMI (ST elevation myocardial infarction) (HCC) 01/20/2018   Cardiac cath January 21, 2018: Severe multivessel disease with unclear lesion, but opted for PCI/DES x1 to the RI.  Appeared to have CTO of the distal LAD and circumflex as well as moderate mid LAD and diagonal disease as well as PDA.SABRA  Unable to cross circumflex lesion.   STEMI (ST elevation myocardial infarction) (HCC) 03/2022   Stroke (HCC) 11/2015   At times pt has dizziness with loss of vision   Tremors of nervous system 2011    Past Surgical History:  Procedure Laterality Date   APPENDECTOMY     BLADDER SURGERY     COLONOSCOPY     CORONARY STENT INTERVENTION N/A 01/21/2018   Procedure: CORONARY STENT INTERVENTION;  Surgeon: Anner Alm ORN, MD;  Location: [facility] INVASIVE CV LAB;  Service: Cardiovascular;  Laterality: N/A;  95% Ramus Intermedius -PCI with synergy DES 2.25 mm x 12 mm postdilated 2.4 mm.   CORONARY/GRAFT ACUTE MI REVASCULARIZATION N/A 01/21/2018  Procedure: Coronary/Graft Acute MI Revascularization;  Surgeon: Anner Alm ORN, MD;  Location: [facility] INVASIVE CV LAB;  Service: Cardiovascular;  Laterality: N/A;  attempted revas to distal CFX;    CORONARY/GRAFT ACUTE MI REVASCULARIZATION N/A 09/04/2018   Procedure: Coronary/Graft Acute MI Revascularization;  Surgeon: Wonda Sharper, MD;  Location: [facility] INVASIVE CV LAB:: PTCA/POBA of rPDA (2.0 mm balloon)   CYSTOSCOPY N/A 01/21/2020   Procedure: CYSTOSCOPY;  Surgeon: Sherrilee Belvie CROME, MD;  Location: [facility];  Service: Urology;  Laterality: N/A;   CYSTOSCOPY N/A 02/09/2021   Procedure: CYSTOSCOPY;  Surgeon: Sherrilee Belvie CROME, MD;  Location: [facility];  Service: Urology;  Laterality: N/A;   CYSTOSCOPY Left  11/08/2022   Procedure: CYSTOSCOPY;  Surgeon: Sherrilee Belvie CROME, MD;  Location: [facility];  Service: Urology;  Laterality: Left;   CYSTOSCOPY W/ RETROGRADES Bilateral 08/22/2015   Procedure: CYSTOSCOPY WITH RETROGRADE PYELOGRAM;  Surgeon: Norleen Seltzer, MD;  Location: [facility];  Service: Urology;  Laterality: Bilateral;   CYSTOSCOPY W/ RETROGRADES Bilateral 01/16/2016   Procedure: CYSTOSCOPY WITH RETROGRADE PYELOGRAM;  Surgeon: Norleen Seltzer, MD;  Location: [facility];  Service: Urology;  Laterality: Bilateral;   CYSTOSCOPY W/ RETROGRADES Bilateral 01/07/2017   Procedure: CYSTOSCOPY WITH RETROGRADE PYELOGRAM;  Surgeon: Seltzer Norleen, MD;  Location: [facility];  Service: Urology;  Laterality: Bilateral;   CYSTOSCOPY WITH BIOPSY N/A 08/22/2015   Procedure: CYSTOSCOPY WITH BLADDER BIOPSY;  Surgeon: Norleen Seltzer, MD;  Location: [facility];  Service: Urology;  Laterality: N/A;   CYSTOSCOPY WITH BIOPSY N/A 01/16/2016   Procedure: CYSTOSCOPY WITH BLADDER BIOPSY;  Surgeon: Norleen Seltzer, MD;  Location: [facility];  Service: Urology;  Laterality: N/A;   CYSTOSCOPY WITH FULGERATION N/A 01/16/2016   Procedure: CYSTOSCOPY WITH FULGERATION;  Surgeon: Norleen Seltzer, MD;  Location: [facility];  Service: Urology;  Laterality: N/A;   CYSTOSCOPY/RETROGRADE/URETEROSCOPY Left 09/06/2022   Procedure: CYSTOSCOPY/RETROGRADE/URETEROSCOPY/STENT PLACEMENT;  Surgeon: Sherrilee Belvie CROME, MD;  Location: [facility];  Service: Urology;  Laterality: Left;   KNEE ARTHROSCOPY Right 1989   LEFT HEART CATH AND CORONARY ANGIOGRAPHY N/A 01/21/2018   Procedure: LEFT HEART CATH AND CORONARY ANGIOGRAPHY;  Surgeon: Anner Alm ORN, MD;  Location: [facility] INVASIVE CV LAB;  Service: Cardiovascular;; 95% RI-DES PCI.  80% o-pCX (PTCA) -> dCX 80%-100% CTO (unsuccessful PTCA unable to cross distal lesion with balloon.)  100% CTO dLAD. RPDA 80% and 70% as well as P AV 180%, PA V2 60%.  (too small for PCI)   LEFT HEART CATH AND CORONARY ANGIOGRAPHY N/A 09/04/2018   Procedure: LEFT HEART CATH AND  CORONARY ANGIOGRAPHY;  Surgeon: Wonda Sharper, MD;  Location: [facility] INVASIVE CV LAB: (presumed Inferior STEMI) = progression of small rPDA -95% (PTCA only). Patent RI stent. CTO of apical LAD & mCx.   ROBOT ASSITED LAPAROSCOPIC NEPHROURETERECTOMY Left 11/08/2022   Procedure: XI ROBOT ASSITED LAPAROSCOPIC NEPHROURETERECTOMY;  Surgeon: Sherrilee Belvie CROME, MD;  Location: [facility];  Service: Urology;  Laterality: Left;   ROTATOR CUFF REPAIR Bilateral 2000   rt. leg fracture surgery     TRANSTHORACIC ECHOCARDIOGRAM  01/21/2018   Mild LVH.  EF 50 and 55%.  Apical inferior hypokinesis.  Mid-apical anterolateral hypokinesis.  Normal diastolic function for age    TRANSTHORACIC ECHOCARDIOGRAM  09/04/2018   -? Inf STEMI vs. Pericarditis:  Mildly reduced EF of 45 to 50%.  Impaired relaxation-GR 1 DD.  Severe hypokinesis of the anterolateral and inferolateral wall.  Small-moderate anterior pericardial effusion.  Moderate aortic sclerosis but no stenosis.   TRANSURETHRAL RESECTION OF BLADDER  TUMOR N/A 01/07/2017   Procedure: TRANSURETHRAL RESECTION OF BLADDER TUMOR (TURBT);  Surgeon: Watt Rush, MD;  Location: [facility];  Service: Urology;  Laterality: N/A;   TRANSURETHRAL RESECTION OF BLADDER TUMOR N/A 01/21/2020   Procedure: TRANSURETHRAL RESECTION OF BLADDER TUMOR (TURBT);  Surgeon: Sherrilee Belvie CROME, MD;  Location: [facility];  Service: Urology;  Laterality: N/A;   TRANSURETHRAL RESECTION OF BLADDER TUMOR N/A 02/09/2021   Procedure: TRANSURETHRAL RESECTION OF BLADDER TUMOR (TURBT);  Surgeon: Sherrilee Belvie CROME, MD;  Location: [facility];  Service: Urology;  Laterality: N/A;   URETERAL BIOPSY Left 09/06/2022   Procedure: URETERAL BIOPSY/fulgeration;  Surgeon: Sherrilee Belvie CROME, MD;  Location: [facility];  Service: Urology;  Laterality: Left;   WISDOM TOOTH EXTRACTION      Prior to Admission medications  Medication Sig Start Date End Date Taking? Authorizing Provider  aspirin  EC 81 MG tablet Take 81 mg by mouth daily.  Swallow whole.   Yes [provider]  Carboxymethylcellul-Glycerin (LUBRICATING EYE DROPS OP) Place 1 drop into both eyes daily as needed (dry eyes).   Yes [provider]  Cholecalciferol (VITAMIN D) 50 MCG (2000 UT) tablet Take 2,000 Units by mouth 2 (two) times daily.   Yes [provider]  diclofenac  Sodium (VOLTAREN ) 1 % GEL Apply 1 Application topically 4 (four) times daily as needed (pain).   Yes [provider]  empagliflozin  (JARDIANCE ) 25 MG TABS tablet Take 25 mg by mouth daily. 05/02/19  Yes Dettinger, Fonda LABOR, MD  fenofibrate  (TRICOR ) 48 MG tablet Take 1 tablet (48 mg total) by mouth daily. 05/02/19  Yes Dettinger, Fonda LABOR, MD  furosemide  (LASIX ) 40 MG tablet Take 20 mg by mouth daily. Patient taking differently: Take 40 mg by mouth daily.   Yes [provider]  glimepiride  (AMARYL ) 2 MG tablet Take 2 mg by mouth daily before breakfast.   Yes [provider]  insulin  degludec (TRESIBA FLEXTOUCH) 100 UNIT/ML FlexTouch Pen Inject 14 Units into the skin daily.   Yes [provider]  isosorbide  mononitrate (IMDUR ) 30 MG 24 hr tablet Take 1 tablet (30 mg total) by mouth daily. Patient taking differently: Take 15 mg by mouth every evening. 08/09/19  Yes Anner Alm ORN, MD  levothyroxine  (SYNTHROID ) 50 MCG tablet Take 50 mcg by mouth daily before breakfast.   Yes [provider]  magnesium  hydroxide (DULCOLAX) 400 MG/5ML suspension Take 15-30 mLs by mouth as needed for mild constipation.   Yes [provider]  metFORMIN  (GLUCOPHAGE ) 1000 MG tablet Take 1 tablet by mouth 2 times daily with a meal. Patient taking differently: Take 1,000 mg by mouth 2 (two) times daily with a meal. 11/19/19  Yes Dettinger, Fonda LABOR, MD  metoprolol  succinate (TOPROL -XL) 25 MG 24 hr tablet Take 25 mg by mouth daily.   Yes [provider]  Multiple Vitamins-Minerals (PRESERVISION AREDS 2) CAPS Take 1 capsule by mouth 2 (two)  times daily.   Yes [provider]  [facility] VERIO test strip daily. 04/01/21  Yes [provider]  pantoprazole  (PROTONIX ) 20 MG tablet Take 1 tablet (20 mg total) by mouth daily. 05/10/24  Yes Drinda Belgard, Elspeth SQUIBB, MD  primidone  (MYSOLINE ) 50 MG tablet Take 1 tablet (50 mg total) by mouth 4 (four) times daily. Patient taking differently: Take 100 mg by mouth 4 (four) times daily. 11/22/19  Yes Dettinger, Fonda LABOR, MD  ranolazine  (RANEXA ) 500 MG 12 hr tablet Take 500 mg by mouth 2 (two) times daily.  Yes [provider]  rosuvastatin  (CRESTOR ) 40 MG tablet Take 1 tablet (40 mg total) by mouth daily. 11/19/19  Yes Dettinger, Fonda LABOR, MD  tamsulosin  (FLOMAX ) 0.4 MG CAPS capsule Take 1 capsule (0.4 mg total) by mouth daily. 05/02/19  Yes Dettinger, Fonda LABOR, MD  clopidogrel  (PLAVIX ) 75 MG tablet Take 75 mg by mouth daily. 03/04/20   [provider]  nitroGLYCERIN  (NITROSTAT ) 0.4 MG SL tablet PLACE 1 TABLET UNDER THE TONGUE AT ONSET OF CHEST PAIN EVERY 5 MINTUES UP TO 3 TIMES AS NEEDED 04/20/21   Anner Alm ORN, MD  oxyCODONE -acetaminophen  (PERCOCET/ROXICET) 5-325 MG tablet Take 1 tablet by mouth every 4 (four) hours as needed.    [provider]  polyethylene glycol (MIRALAX ) 17 g packet Take 34 g by mouth daily. 05/10/24   Jamesa Tedrick, Elspeth SQUIBB, MD    Current Outpatient Medications  Medication Sig Dispense Refill   aspirin  EC 81 MG tablet Take 81 mg by mouth daily. Swallow whole.     Carboxymethylcellul-Glycerin (LUBRICATING EYE DROPS OP) Place 1 drop into both eyes daily as needed (dry eyes).     Cholecalciferol (VITAMIN D) 50 MCG (2000 UT) tablet Take 2,000 Units by mouth 2 (two) times daily.     diclofenac  Sodium (VOLTAREN ) 1 % GEL Apply 1 Application topically 4 (four) times daily as needed (pain).     empagliflozin  (JARDIANCE ) 25 MG TABS tablet Take 25 mg by mouth daily. 90 mg 3   fenofibrate  (TRICOR ) 48 MG tablet Take 1 tablet (48 mg total) by  mouth daily. 90 tablet 3   furosemide  (LASIX ) 40 MG tablet Take 20 mg by mouth daily. (Patient taking differently: Take 40 mg by mouth daily.)     glimepiride  (AMARYL ) 2 MG tablet Take 2 mg by mouth daily before breakfast.     insulin  degludec (TRESIBA FLEXTOUCH) 100 UNIT/ML FlexTouch Pen Inject 14 Units into the skin daily.     isosorbide  mononitrate (IMDUR ) 30 MG 24 hr tablet Take 1 tablet (30 mg total) by mouth daily. (Patient taking differently: Take 15 mg by mouth every evening.) 90 tablet 3   levothyroxine  (SYNTHROID ) 50 MCG tablet Take 50 mcg by mouth daily before breakfast.     magnesium  hydroxide (DULCOLAX) 400 MG/5ML suspension Take 15-30 mLs by mouth as needed for mild constipation.     metFORMIN  (GLUCOPHAGE ) 1000 MG tablet Take 1 tablet by mouth 2 times daily with a meal. (Patient taking differently: Take 1,000 mg by mouth 2 (two) times daily with a meal.) 180 tablet 3   metoprolol  succinate (TOPROL -XL) 25 MG 24 hr tablet Take 25 mg by mouth daily.     Multiple Vitamins-Minerals (PRESERVISION AREDS 2) CAPS Take 1 capsule by mouth 2 (two) times daily.     ONETOUCH VERIO test strip daily.     pantoprazole  (PROTONIX ) 20 MG tablet Take 1 tablet (20 mg total) by mouth daily.     primidone  (MYSOLINE ) 50 MG tablet Take 1 tablet (50 mg total) by mouth 4 (four) times daily. (Patient taking differently: Take 100 mg by mouth 4 (four) times daily.) 360 tablet 3   ranolazine  (RANEXA ) 500 MG 12 hr tablet Take 500 mg by mouth 2 (two) times daily.     rosuvastatin  (CRESTOR ) 40 MG tablet Take 1 tablet (40 mg total) by mouth daily. 90 tablet 3   tamsulosin  (FLOMAX ) 0.4 MG CAPS capsule Take 1 capsule (0.4 mg total) by mouth daily. 90 capsule 3   clopidogrel  (PLAVIX ) 75 MG tablet  Take 75 mg by mouth daily.     nitroGLYCERIN  (NITROSTAT ) 0.4 MG SL tablet PLACE 1 TABLET UNDER THE TONGUE AT ONSET OF CHEST PAIN EVERY 5 MINTUES UP TO 3 TIMES AS NEEDED 25 tablet 0   oxyCODONE -acetaminophen  (PERCOCET/ROXICET)  5-325 MG tablet Take 1 tablet by mouth every 4 (four) hours as needed.     polyethylene glycol (MIRALAX ) 17 g packet Take 34 g by mouth daily.     No current facility-administered medications for this visit.    Allergies as of 06/14/2024 - Review Complete 06/14/2024  Allergen Reaction Noted   Tramadol  Shortness Of Breath 01/14/2017   Benadryl  [diphenhydramine ] Other (See Comments) 11/05/2022   Betadine [povidone iodine] Itching 11/05/2022   Trulicity  [dulaglutide ] Swelling 02/16/2016    Family History  Problem Relation Age of Onset   Diabetes Mother 59       type 1   Stroke Mother    Dementia Father    Diabetes Brother    Heart attack Brother    Diabetes Brother    Testicular cancer Brother        dx. 50s   Breast cancer Maternal Aunt    Lung cancer Maternal Uncle    Bladder Cancer Paternal Grandmother    Prostate cancer Paternal Grandfather    Colon cancer Neg Hx    Colon polyps Neg Hx    Esophageal cancer Neg Hx    Rectal cancer Neg Hx    Stomach cancer Neg Hx    Pancreatic cancer Neg Hx    Inflammatory bowel disease Neg Hx    Liver disease Neg Hx     Social History   Socioeconomic History   Marital status: Single    Spouse name: Not on file   Number of children: 1   Years of education: some college   Highest education level: Some college, no degree  Occupational History   Occupation: retired    Comment: holiday representative, some farming  Tobacco Use   Smoking status: Former    Current packs/day: 1.00    Average packs/day: 1 pack/day for 50.0 years (50.0 ttl pk-yrs)    Types: Cigarettes   Smokeless tobacco: Former  Building Services Engineer status: Never Used  Substance and Sexual Activity   Alcohol use: No    Comment: rarely   Drug use: No   Sexual activity: Never  Other Topics Concern   Not on file  Social History Narrative   [place]ingo is retired and lives alone. He has a daughter that lives locally that he sees regularly. He has a couple of horses and an outside  dog. He enjoys working outside and taking care of his horses.    Social Drivers of Health   Tobacco Use: Medium Risk (06/14/2024)   Patient History    Smoking Tobacco Use: Former    Smokeless Tobacco Use: Former    Passive Exposure: Not on Actuary Strain: Medium Risk (08/26/2023)   Received from [facility]   Overall Financial Resource Strain (CARDIA)    Difficulty of Paying Living Expenses: Somewhat hard  Food Insecurity: No Food Insecurity (08/26/2023)   Received from [facility]   Epic    Within the past 12 months, you worried that your food would run out before you got the money to buy more.: Never true    Within the past 12 months, the food you bought just didn't last and you didn't have money to get more.: Never true  Transportation Needs: No  Transportation Needs (08/26/2023)   Received from [facility]   PRAPARE - Transportation    Lack of Transportation (Medical): No    Lack of Transportation (Non-Medical): No  Physical Activity: Insufficiently Active (08/26/2023)   Received from [facility]   Exercise Vital Sign    On average, how many days per week do you engage in moderate to strenuous exercise (like a brisk walk)?: 2 days    On average, how many minutes do you engage in exercise at this level?: 20 min  Stress: No Stress Concern Present (08/26/2023)   Received from [facility] - Occupational Stress Questionnaire    Feeling of Stress : Only a little  Social Connections: Somewhat Isolated (08/26/2023)   Received from [facility]   Social Network    How would you rate your social network (family, work, friends)?: Restricted participation with some degree of social isolation  Intimate Partner Violence: Not At Risk (08/26/2023)   Received from [facility]   HITS    Over the last 12 months how often did your partner physically hurt you?: Never    Over the last 12 months how often did your partner insult you or  talk down to you?: Never    Over the last 12 months how often did your partner threaten you with physical harm?: Never    Over the last 12 months how often did your partner scream or curse at you?: Never  Depression (PHQ2-9): Not on file  Alcohol Screen: Not on file  Housing: Low Risk (08/26/2023)   Received from [facility]    In the last 12 months, was there a time when you were not able to pay the mortgage or rent on time?: No    In the past 12 months, how many times have you moved where you were living?: 0    At any time in the past 12 months, were you homeless or living in a shelter (including now)?: No  Utilities: Not At Risk (08/26/2023)   Received from [facility] Utilities    Threatened with loss of utilities: No  Health Literacy: Not on file    Review of Systems: All other review of systems negative except as mentioned in the HPI.  Physical Exam: Vital signs BP (!) 117/50   Pulse 81   Temp 97.7 F (36.5 C) (Skin)   Ht 6' 4 (1.93 m)   Wt 209 lb (94.8 kg)   SpO2 96%   BMI 25.44 kg/m   General:   Alert,  Well-developed, pleasant and cooperative in NAD Lungs:  Clear throughout to auscultation.   Heart:  Regular rate and rhythm Abdomen:  Soft, nontender and nondistended.   Neuro/Psych:  Alert and cooperative. Normal mood and affect. A and O x 3  Andrew Naval, MD [facility]

## 2024-06-14 NOTE — Patient Instructions (Signed)
 RESUME Plavix  Today.  YOU HAD AN ENDOSCOPIC PROCEDURE TODAY AT THE [facility]:   Refer to the procedure report that was given to you for any specific questions about what was found during the examination.  If the procedure report does not answer your questions, please call your gastroenterologist to clarify.  If you requested that your care partner not be given the details of your procedure findings, then the procedure report has been included in a sealed envelope for you to review at your convenience later.  YOU SHOULD EXPECT: Some feelings of bloating in the abdomen. Passage of more gas than usual.  Walking can help get rid of the air that was put into your GI tract during the procedure and reduce the bloating. If you had a lower endoscopy (such as a colonoscopy or flexible sigmoidoscopy) you may notice spotting of blood in your stool or on the toilet paper. If you underwent a bowel prep for your procedure, you may not have a normal bowel movement for a few days.  Please Note:  You might notice some irritation and congestion in your nose or some drainage.  This is from the oxygen used during your procedure.  There is no need for concern and it should clear up in a day or so.  SYMPTOMS TO REPORT IMMEDIATELY:  Following upper endoscopy (EGD)  Vomiting of blood or coffee ground material  New chest pain or pain under the shoulder blades  Painful or persistently difficult swallowing  New shortness of breath  Fever of 100F or higher  Black, tarry-looking stools  For urgent or emergent issues, a gastroenterologist can be reached at any hour by calling [phone]. Do not use MyChart messaging for urgent concerns.    DIET:  We do recommend a small meal at first, but then you may proceed to your regular diet.  Drink plenty of fluids but you should avoid alcoholic beverages for 24 hours.  ACTIVITY:  You should plan to take it easy for the rest of today and you should NOT DRIVE or  use heavy machinery until tomorrow (because of the sedation medicines used during the test).    FOLLOW UP: Our staff will call the number listed on your records the next business day following your procedure.  We will call around 7:15- 8:00 am to check on you and address any questions or concerns that you may have regarding the information given to you following your procedure. If we do not reach you, we will leave a message.     If any biopsies were taken you will be contacted by phone or by letter within the next 1-3 weeks.  Please call us  at [phone] if you have not heard about the biopsies in 3 weeks.    SIGNATURES/CONFIDENTIALITY: You and/or your care partner have signed paperwork which will be entered into your electronic medical record.  These signatures attest to the fact that that the information above on your After Visit Summary has been reviewed and is understood.  Full responsibility of the confidentiality of this discharge information lies with you and/or your care-partner.

## 2024-06-14 NOTE — Progress Notes (Signed)
 Report given to PACU, vss

## 2024-06-14 NOTE — Op Note (Signed)
 [facility] Patient Name: Andrew [name] Procedure Date: 06/14/2024 9:54 AM MRN: [id number] Endoscopist: Elspeth P. Leigh , MD, [id number] Age: 74 Referring MD:  Date of Birth: June 09, 1950 Gender: Male Account #: [id number] Procedure:                Upper GI endoscopy Indications:              Surveillance for malignancy due to personal history                            of epidermoid metaplasia, Barrett's esophagus. Also                            mild intermittent dysphagia Medicines:                Monitored Anesthesia Care Procedure:                Pre-Anesthesia Assessment:                           - Prior to the procedure, a History and Physical                            was performed, and patient medications and                            allergies were reviewed. The patient's tolerance of                            previous anesthesia was also reviewed. The risks                            and benefits of the procedure and the sedation                            options and risks were discussed with the patient.                            All questions were answered, and informed consent                            was obtained. Prior Anticoagulants: The patient has                            taken Plavix  (clopidogrel ), last dose was 5 days                            prior to procedure. ASA Grade Assessment: III - A                            patient with severe systemic disease. After                            reviewing the risks and benefits, the patient was  deemed in satisfactory condition to undergo the                            procedure.                           After obtaining informed consent, the endoscope was                            passed under direct vision. Throughout the                            procedure, the patient's blood pressure, pulse, and                            oxygen saturations were monitored  continuously. The                            GIF [id number] #[id number] was introduced through the                            mouth, and advanced to the second part of duodenum.                            The upper GI endoscopy was accomplished without                            difficulty. The patient tolerated the procedure                            well. Scope In: Scope Out: Findings:                 Esophagogastric landmarks were identified: the                            Z-line was found at 38 cm, the gastroesophageal                            junction was found at 40 cm and the upper extent of                            the gastric folds was found at 42 cm from the                            incisors.                           A 2 cm hiatal hernia was present.                           There were esophageal mucosal changes classified as                            Barrett's stage C0-M2 per Prague criteria present  in the lower third of the esophagus. The maximum                            longitudinal extent of these mucosal changes was 2                            cm in length. Multiple islands of salmon colored                            mucosa - similar in appearance compared to the last                            exam. Biopsies were taken with a cold forceps for                            histology.                           A single 6 mm plaque was found in the lower third                            of the esophagus, 35 cm from the incisors. Biopsies                            were taken with a cold forceps for histology. It                            appeared smaller than the last time this was                            evaluated (8-46mm in length). This was grossly                            consistent with epidermoid metaplasia which he has                            had in the past (he has declined RFA, preferring                             surveillance)                           The exam of the esophagus was otherwise normal. No                            overt stenosis / stricture. Dysphagia is mild /                            intermittent, he declined empiric dilation when                            discussed pre-procedure.  Patchy erythematous mucosa was found in the gastric                            fundus. Biopsies were taken with a cold forceps for                            Helicobacter pylori testing.                           The exam of the stomach was otherwise normal.                           The examined duodenum was normal. Complications:            No immediate complications. Estimated blood loss:                            Minimal. Estimated Blood Loss:     Estimated blood loss was minimal. Impression:               - Esophagogastric landmarks identified.                           - 2 cm hiatal hernia.                           - Esophageal mucosal changes classified as                            Barrett's stage C0-M2 per Prague criteria. Biopsied.                           - A single plaque in the lower third of the                            esophagus - slightly smaller in appearance compared                            to the last exam. Biopsied.                           - Normal esophagus otherwise - no overt stenosis /                            stricture appreciated.                           - Erythematous mucosa in the gastric fundus.                            Biopsied.                           - Normal stomach otherwise.                           - Normal examined duodenum.  Recommendation:           - Patient has a contact number available for                            emergencies. The signs and symptoms of potential                            delayed complications were discussed with the                            patient. Return to normal activities tomorrow.                             Written discharge instructions were provided to the                            patient.                           - Resume previous diet.                           - Continue present medications.                           - Resume Plavix  today.                           - Await pathology results. Elspeth P. Leigh, MD 06/14/2024 10:27:57 AM This report has been signed electronically.

## 2024-06-14 NOTE — Progress Notes (Signed)
 Pt's states no medical or surgical changes since previsit or office visit.

## 2024-06-15 ENCOUNTER — Telehealth: Payer: Self-pay

## 2024-06-15 NOTE — Telephone Encounter (Signed)
" °  Follow up Call-     06/14/2024    9:30 AM 02/21/2023    1:42 PM  Call back number  Post procedure Call Back phone  # [phone]  Permission to leave phone message Yes Yes     Patient questions:  Do you have a fever, pain , or abdominal swelling? No. Pain Score  0 *  Have you tolerated food without any problems? Yes.    Have you been able to return to your normal activities? Yes.    Do you have any questions about your discharge instructions: Diet   No. Medications  No. Follow up visit  No.  Do you have questions or concerns about your Care? No.  Actions: * If pain score is 4 or above: No action needed, pain <4.   "

## 2024-06-20 ENCOUNTER — Ambulatory Visit: Payer: Self-pay | Admitting: Gastroenterology

## 2024-06-20 LAB — SURGICAL PATHOLOGY

## 2024-08-08 ENCOUNTER — Other Ambulatory Visit: Admitting: Urology
